# Patient Record
Sex: Male | Born: 1935
Health system: Southern US, Community
[De-identification: ages and names within clinical notes are randomized; demographics above are authoritative.]

## PROBLEM LIST (undated history)

## (undated) DIAGNOSIS — I739 Peripheral vascular disease, unspecified: Secondary | ICD-10-CM

## (undated) DIAGNOSIS — C801 Malignant (primary) neoplasm, unspecified: Secondary | ICD-10-CM

## (undated) DIAGNOSIS — K219 Gastro-esophageal reflux disease without esophagitis: Secondary | ICD-10-CM

## (undated) DIAGNOSIS — I10 Essential (primary) hypertension: Secondary | ICD-10-CM

## (undated) DIAGNOSIS — E039 Hypothyroidism, unspecified: Secondary | ICD-10-CM

## (undated) HISTORY — PX: TONSILLECTOMY: SUR1361

---

## 1999-08-27 ENCOUNTER — Encounter: Payer: Self-pay | Admitting: Family Medicine

## 1999-08-27 ENCOUNTER — Ambulatory Visit (HOSPITAL_COMMUNITY): Admission: RE | Admit: 1999-08-27 | Discharge: 1999-08-27 | Payer: Self-pay | Admitting: Family Medicine

## 2001-05-02 ENCOUNTER — Emergency Department (HOSPITAL_COMMUNITY): Admission: EM | Admit: 2001-05-02 | Discharge: 2001-05-02 | Payer: Self-pay

## 2001-05-14 ENCOUNTER — Encounter: Admission: RE | Admit: 2001-05-14 | Discharge: 2001-05-14 | Payer: Self-pay | Admitting: Family Medicine

## 2001-05-14 ENCOUNTER — Encounter: Payer: Self-pay | Admitting: Family Medicine

## 2006-10-27 ENCOUNTER — Encounter (HOSPITAL_BASED_OUTPATIENT_CLINIC_OR_DEPARTMENT_OTHER): Admission: RE | Admit: 2006-10-27 | Discharge: 2006-11-23 | Payer: Self-pay | Admitting: Internal Medicine

## 2006-12-05 ENCOUNTER — Encounter (HOSPITAL_BASED_OUTPATIENT_CLINIC_OR_DEPARTMENT_OTHER): Admission: RE | Admit: 2006-12-05 | Discharge: 2007-01-25 | Payer: Self-pay | Admitting: Surgery

## 2011-02-07 ENCOUNTER — Encounter (HOSPITAL_BASED_OUTPATIENT_CLINIC_OR_DEPARTMENT_OTHER): Payer: Medicare Other | Attending: Internal Medicine

## 2011-02-07 DIAGNOSIS — I10 Essential (primary) hypertension: Secondary | ICD-10-CM | POA: Insufficient documentation

## 2011-02-07 DIAGNOSIS — Z79899 Other long term (current) drug therapy: Secondary | ICD-10-CM | POA: Insufficient documentation

## 2011-02-07 DIAGNOSIS — E039 Hypothyroidism, unspecified: Secondary | ICD-10-CM | POA: Insufficient documentation

## 2011-02-07 DIAGNOSIS — L97809 Non-pressure chronic ulcer of other part of unspecified lower leg with unspecified severity: Secondary | ICD-10-CM | POA: Insufficient documentation

## 2011-02-07 DIAGNOSIS — E538 Deficiency of other specified B group vitamins: Secondary | ICD-10-CM | POA: Insufficient documentation

## 2011-02-07 DIAGNOSIS — Z7982 Long term (current) use of aspirin: Secondary | ICD-10-CM | POA: Insufficient documentation

## 2011-02-07 DIAGNOSIS — I872 Venous insufficiency (chronic) (peripheral): Secondary | ICD-10-CM | POA: Insufficient documentation

## 2011-02-07 DIAGNOSIS — L97509 Non-pressure chronic ulcer of other part of unspecified foot with unspecified severity: Secondary | ICD-10-CM | POA: Insufficient documentation

## 2011-02-28 ENCOUNTER — Encounter (HOSPITAL_BASED_OUTPATIENT_CLINIC_OR_DEPARTMENT_OTHER): Payer: Medicare Other | Attending: Internal Medicine

## 2011-02-28 DIAGNOSIS — L97809 Non-pressure chronic ulcer of other part of unspecified lower leg with unspecified severity: Secondary | ICD-10-CM | POA: Insufficient documentation

## 2011-02-28 DIAGNOSIS — I872 Venous insufficiency (chronic) (peripheral): Secondary | ICD-10-CM | POA: Insufficient documentation

## 2011-02-28 DIAGNOSIS — E538 Deficiency of other specified B group vitamins: Secondary | ICD-10-CM | POA: Insufficient documentation

## 2011-02-28 DIAGNOSIS — Z7982 Long term (current) use of aspirin: Secondary | ICD-10-CM | POA: Insufficient documentation

## 2011-02-28 DIAGNOSIS — Z79899 Other long term (current) drug therapy: Secondary | ICD-10-CM | POA: Insufficient documentation

## 2011-02-28 DIAGNOSIS — E039 Hypothyroidism, unspecified: Secondary | ICD-10-CM | POA: Insufficient documentation

## 2011-02-28 DIAGNOSIS — L97509 Non-pressure chronic ulcer of other part of unspecified foot with unspecified severity: Secondary | ICD-10-CM | POA: Insufficient documentation

## 2011-02-28 DIAGNOSIS — I10 Essential (primary) hypertension: Secondary | ICD-10-CM | POA: Insufficient documentation

## 2012-01-22 ENCOUNTER — Emergency Department (HOSPITAL_COMMUNITY): Payer: Medicare Other

## 2012-01-22 ENCOUNTER — Emergency Department (HOSPITAL_COMMUNITY)
Admission: EM | Admit: 2012-01-22 | Discharge: 2012-01-23 | Disposition: A | Discharge status: Home / Self Care | Payer: Medicare Other | Attending: Emergency Medicine | Admitting: Emergency Medicine

## 2012-01-22 ENCOUNTER — Encounter (HOSPITAL_COMMUNITY): Payer: Self-pay | Admitting: *Deleted

## 2012-01-22 ENCOUNTER — Other Ambulatory Visit: Payer: Self-pay

## 2012-01-22 DIAGNOSIS — M25519 Pain in unspecified shoulder: Secondary | ICD-10-CM | POA: Insufficient documentation

## 2012-01-22 DIAGNOSIS — IMO0001 Reserved for inherently not codable concepts without codable children: Secondary | ICD-10-CM | POA: Insufficient documentation

## 2012-01-22 DIAGNOSIS — M549 Dorsalgia, unspecified: Secondary | ICD-10-CM | POA: Insufficient documentation

## 2012-01-22 DIAGNOSIS — M7918 Myalgia, other site: Secondary | ICD-10-CM

## 2012-01-22 DIAGNOSIS — R799 Abnormal finding of blood chemistry, unspecified: Secondary | ICD-10-CM | POA: Insufficient documentation

## 2012-01-22 DIAGNOSIS — J984 Other disorders of lung: Secondary | ICD-10-CM | POA: Insufficient documentation

## 2012-01-22 DIAGNOSIS — M25512 Pain in left shoulder: Secondary | ICD-10-CM

## 2012-01-22 DIAGNOSIS — K449 Diaphragmatic hernia without obstruction or gangrene: Secondary | ICD-10-CM | POA: Insufficient documentation

## 2012-01-22 LAB — COMPREHENSIVE METABOLIC PANEL
ALT: 14 U/L (ref 0–53)
AST: 25 U/L (ref 0–37)
Albumin: 4.3 g/dL (ref 3.5–5.2)
Alkaline Phosphatase: 61 U/L (ref 39–117)
BUN: 20 mg/dL (ref 6–23)
CO2: 27 mEq/L (ref 19–32)
Calcium: 10 mg/dL (ref 8.4–10.5)
Chloride: 100 mEq/L (ref 96–112)
Creatinine, Ser: 1.42 mg/dL — ABNORMAL HIGH (ref 0.50–1.35)
GFR calc Af Amer: 49 mL/min — ABNORMAL LOW (ref >90)
GFR calc non Af Amer: 42 mL/min — ABNORMAL LOW (ref >90)
Glucose, Bld: 99 mg/dL (ref 70–99)
Potassium: 4.1 mEq/L (ref 3.5–5.1)
Sodium: 138 mEq/L (ref 135–145)
Total Bilirubin: 0.6 mg/dL (ref 0.3–1.2)
Total Protein: 7.3 g/dL (ref 6.0–8.3)

## 2012-01-22 LAB — DIFFERENTIAL
Basophils Absolute: 0 10*3/uL (ref 0.0–0.1)
Basophils Relative: 1 % (ref 0–1)
Eosinophils Absolute: 0.1 10*3/uL (ref 0.0–0.7)
Eosinophils Relative: 2 % (ref 0–5)
Lymphocytes Relative: 37 % (ref 12–46)
Lymphs Abs: 1.6 10*3/uL (ref 0.7–4.0)
Monocytes Absolute: 0.4 10*3/uL (ref 0.1–1.0)
Monocytes Relative: 10 % (ref 3–12)
Neutro Abs: 2.2 10*3/uL (ref 1.7–7.7)
Neutrophils Relative %: 51 % (ref 43–77)

## 2012-01-22 LAB — CBC
HCT: 41.1 % (ref 39.0–52.0)
Hemoglobin: 14.1 g/dL (ref 13.0–17.0)
MCH: 30.7 pg (ref 26.0–34.0)
MCHC: 34.3 g/dL (ref 30.0–36.0)
MCV: 89.5 fL (ref 78.0–100.0)
Platelets: 136 10*3/uL — ABNORMAL LOW (ref 150–400)
RBC: 4.59 MIL/uL (ref 4.22–5.81)
RDW: 13.5 % (ref 11.5–15.5)
WBC: 4.3 10*3/uL (ref 4.0–10.5)

## 2012-01-22 LAB — D-DIMER, QUANTITATIVE (NOT AT ARMC): D-Dimer, Quant: 0.57 ug/mL-FEU — ABNORMAL HIGH (ref 0.00–0.48)

## 2012-01-22 LAB — LIPASE, BLOOD: Lipase: 26 U/L (ref 11–59)

## 2012-01-22 LAB — TROPONIN I: Troponin I: <0.3 ng/mL (ref <0.30)

## 2012-01-22 MED ORDER — OXYCODONE-ACETAMINOPHEN 5-325 MG PO TABS
1.0000 | ORAL_TABLET | Freq: Once | ORAL | Status: AC
  Administered 2012-01-22: 1 via ORAL
  Filled 2012-01-22: qty 1

## 2012-01-22 MED ORDER — IOHEXOL 300 MG/ML  SOLN
80.0000 mL | Freq: Once | INTRAMUSCULAR | Status: AC | PRN
  Administered 2012-01-22: 80 mL via INTRAVENOUS

## 2012-01-22 MED ORDER — SODIUM CHLORIDE 0.9 % IV BOLUS (SEPSIS)
1000.0000 mL | Freq: Once | INTRAVENOUS | Status: AC
  Administered 2012-01-22: 1000 mL via INTRAVENOUS

## 2012-01-22 NOTE — ED Notes (Signed)
MD at bedside. 

## 2012-01-22 NOTE — ED Notes (Signed)
Patient transported to CT 

## 2012-01-22 NOTE — ED Notes (Signed)
Pt states he was lifting wood and starting having back pain. Pt states he took a Advil and slept all night. Pain returned when he began to do work around the home

## 2012-01-22 NOTE — ED Notes (Signed)
Pt in c/o episode of abd pain last night and feeling bloated, today he noted sharp mid to upper back pain, pain can be caused by movement but is constant, denies chest pain, states pain is increased when taking a deep breath, denies abd pain today, states he has been burping

## 2012-01-23 MED ORDER — TRAMADOL HCL 50 MG PO TABS: 50.0000 mg | ORAL_TABLET | Freq: Four times a day (QID) | ORAL | Status: AC | PRN

## 2012-01-23 MED ORDER — OXYCODONE-ACETAMINOPHEN 5-325 MG PO TABS: 1.0000 | ORAL_TABLET | Freq: Four times a day (QID) | ORAL | Status: AC | PRN

## 2012-01-23 NOTE — ED Notes (Signed)
Rx given Pt

## 2012-01-23 NOTE — ED Provider Notes (Signed)
History     CSN: [id number]  Arrival date & time 01/22/12  1952   First MD Initiated Contact with Patient 01/22/12 2132      Chief Complaint  Patient presents with  . Back Pain  . Abdominal Pain    (Consider location/radiation/quality/duration/timing/severity/associated sxs/prior treatment) HPI Patient is a pleasant 78-year-old male who presents today complaining of left shoulder pain that radiates through his chest. This occurred for the first time after the patient was carrying firewood. He reports that the pain is not constant medications and goes. It seems to happen when patient moves in a certain way. He denies any chest pain or shortness of breath at this time. He also denies any abdominal pain. Patient does feel his pain is worse when he takes a deep breath. He is not have any history of DVT or PE. Patient is hypertensive on presentation but his daughter reports that this is always the case and he sees a physician. When she checks his blood pressures at home they're always within normal limits. Patient has not had any recent fever cough. There are no other associated or modifying factors. History reviewed. No pertinent past medical history.  History reviewed. No pertinent past surgical history.  History reviewed. No pertinent family history.  History  Substance Use Topics  . Smoking status: Not on file  . Smokeless tobacco: Not on file  . Alcohol Use: Not on file      Review of Systems  Constitutional: Negative.   HENT: Negative.   Eyes: Negative.   Respiratory: Negative.   Cardiovascular: Negative.   Gastrointestinal: Negative.   Genitourinary: Negative.   Musculoskeletal: Negative.        See history of present illness  Skin: Negative.   Neurological: Negative.   Hematological: Negative.   Psychiatric/Behavioral: Negative.   All other systems reviewed and are negative.    Allergies  Review of patient's allergies indicates no known allergies.  Home  Medications   Current Outpatient Rx  Name Route Sig Dispense Refill  . ASPIRIN EC 81 MG PO TBEC Oral Take 81 mg by mouth daily.    Marland Kitchen CALCIUM CARBONATE ANTACID 500 MG PO CHEW Oral Chew 1 tablet by mouth 3 (three) times daily.    Marland Kitchen VITAMIN B-12 IJ Injection Inject 1 Units as directed every 30 (thirty) days.    . ENALAPRIL MALEATE 20 MG PO TABS Oral Take 20 mg by mouth daily.    Marland Kitchen FAMOTIDINE 20 MG PO TABS Oral Take 20 mg by mouth 2 (two) times daily.    . FUROSEMIDE 20 MG PO TABS Oral Take 20 mg by mouth daily.    Marland Kitchen LEVOTHYROXINE SODIUM PO Oral Take 1 tablet by mouth daily.    Marland Kitchen PRESCRIPTION MEDICATION Oral Take 1 tablet by mouth daily. Blood pressure medication. Last taken at 7:30am 2/24.      BP 183/98  Pulse 79  Temp(Src) 98.2 F (36.8 C) (Oral)  Resp 28  SpO2 99%  Physical Exam  Nursing note and vitals reviewed. GEN: Well-developed, well-nourished elderly male in no distress HEENT: Atraumatic, normocephalic. Oropharynx clear without erythema EYES: PERRLA BL, no scleral icterus. NECK: Trachea midline, no meningismus CV: regular rate and rhythm. No murmurs, rubs, or gallops PULM: No respiratory distress.  No crackles, wheezes, or rales. GI: soft, non-tender. No guarding, rebound, or tenderness. + bowel sounds  Neuro: cranial nerves 2-12 intact, no abnormalities of strength or sensation, A and O x 3 MSK: Patient moves all 4 extremities symmetrically,  no deformity, edema, or injury noted Psych: no abnormality of mood   ED Course  Procedures (including critical care time)   Date: 01/23/2012  Rate: 72  Rhythm: normal sinus rhythm  QRS Axis: normal  Intervals: PR prolonged  ST/T Wave abnormalities: nonspecific T wave changes  Conduction Disutrbances:first-degree A-V block   Narrative Interpretation:   Old EKG Reviewed: none available   Labs Reviewed  CBC - Abnormal; Notable for the following:    Platelets 136 (*)    All other components within normal limits    COMPREHENSIVE METABOLIC PANEL - Abnormal; Notable for the following:    Creatinine, Ser 1.42 (*)    GFR calc non Af Amer 42 (*)    GFR calc Af Amer 49 (*)    All other components within normal limits  D-DIMER, QUANTITATIVE - Abnormal; Notable for the following:    D-Dimer, Quant 0.57 (*)    All other components within normal limits  DIFFERENTIAL  TROPONIN I  LIPASE, BLOOD    CT ANGIO CHEST W/CM &/OR WO CM   Final Result:         1. Shoulder pain, left   2. Musculoskeletal pain       MDM  Patient was evaluated by myself. Based on his presentation he had workup for possible causes of his symptoms.  This included CBC, CMP and lipase, troponin, chest x-ray, EKG, and d-dimer initially. Patient had slightly elevated creatinine but this is not surprising given his age. Patient also did have a slightly elevated d-dimer. Given the variation in his symptoms with deep breathing CT angiogram reasonable. Given patient's renal function I discussed this with radiology. They reported that CT angiogram be performed with reduced contrast. Patient was also given a liter of normal saline IV bolus. CT image of the chest was unremarkable. Patient was treated with Percocet here in the emergency department felt better. He was discharged with both prescriptions for tramadol and Percocet. Family was told that the patient should try and use tramadol. If he still cannot tolerate his symptoms they can use the Percocet sparingly as that has worked here in the emergency department. Patient and family were comfortable with plan and patient was discharged home in good condition.        Chauncy Passy, MD 01/25/12 [phone]

## 2012-02-13 ENCOUNTER — Emergency Department (HOSPITAL_COMMUNITY): Payer: Medicare Other

## 2012-02-13 ENCOUNTER — Other Ambulatory Visit: Payer: Self-pay

## 2012-02-13 ENCOUNTER — Emergency Department (HOSPITAL_COMMUNITY)
Admission: EM | Admit: 2012-02-13 | Discharge: 2012-02-13 | Disposition: A | Discharge status: Home / Self Care | Payer: Medicare Other | Attending: Emergency Medicine | Admitting: Emergency Medicine

## 2012-02-13 ENCOUNTER — Encounter (HOSPITAL_COMMUNITY): Payer: Self-pay

## 2012-02-13 DIAGNOSIS — K219 Gastro-esophageal reflux disease without esophagitis: Secondary | ICD-10-CM | POA: Insufficient documentation

## 2012-02-13 DIAGNOSIS — I10 Essential (primary) hypertension: Secondary | ICD-10-CM | POA: Insufficient documentation

## 2012-02-13 DIAGNOSIS — K449 Diaphragmatic hernia without obstruction or gangrene: Secondary | ICD-10-CM

## 2012-02-13 DIAGNOSIS — I739 Peripheral vascular disease, unspecified: Secondary | ICD-10-CM | POA: Insufficient documentation

## 2012-02-13 DIAGNOSIS — R079 Chest pain, unspecified: Secondary | ICD-10-CM | POA: Insufficient documentation

## 2012-02-13 HISTORY — DX: Essential (primary) hypertension: I10

## 2012-02-13 HISTORY — DX: Gastro-esophageal reflux disease without esophagitis: K21.9

## 2012-02-13 HISTORY — DX: Peripheral vascular disease, unspecified: I73.9

## 2012-02-13 LAB — BASIC METABOLIC PANEL
BUN: 21 mg/dL (ref 6–23)
CO2: 28 mEq/L (ref 19–32)
Calcium: 9.7 mg/dL (ref 8.4–10.5)
Chloride: 103 mEq/L (ref 96–112)
Creatinine, Ser: 1.35 mg/dL (ref 0.50–1.35)
GFR calc Af Amer: 52 mL/min — ABNORMAL LOW (ref >90)
GFR calc non Af Amer: 45 mL/min — ABNORMAL LOW (ref >90)
Glucose, Bld: 105 mg/dL — ABNORMAL HIGH (ref 70–99)
Potassium: 4.7 mEq/L (ref 3.5–5.1)
Sodium: 142 mEq/L (ref 135–145)

## 2012-02-13 LAB — POCT I-STAT TROPONIN I: Troponin i, poc: 0 ng/mL (ref 0.00–0.08)

## 2012-02-13 LAB — URINALYSIS, ROUTINE W REFLEX MICROSCOPIC
Bilirubin Urine: NEGATIVE
Glucose, UA: NEGATIVE mg/dL
Hgb urine dipstick: NEGATIVE
Ketones, ur: NEGATIVE mg/dL
Leukocytes, UA: NEGATIVE
Nitrite: NEGATIVE
Protein, ur: NEGATIVE mg/dL
Specific Gravity, Urine: 1.011 (ref 1.005–1.030)
Urobilinogen, UA: 1 mg/dL (ref 0.0–1.0)
pH: 6.5 (ref 5.0–8.0)

## 2012-02-13 LAB — CBC
HCT: 40.6 % (ref 39.0–52.0)
Hemoglobin: 13.9 g/dL (ref 13.0–17.0)
MCH: 31 pg (ref 26.0–34.0)
MCHC: 34.2 g/dL (ref 30.0–36.0)
MCV: 90.6 fL (ref 78.0–100.0)
Platelets: 129 10*3/uL — ABNORMAL LOW (ref 150–400)
RBC: 4.48 MIL/uL (ref 4.22–5.81)
RDW: 13.6 % (ref 11.5–15.5)
WBC: 5.2 10*3/uL (ref 4.0–10.5)

## 2012-02-13 LAB — POCT I-STAT, CHEM 8
BUN: 23 mg/dL (ref 6–23)
Calcium, Ion: 1.23 mmol/L (ref 1.12–1.32)
Chloride: 106 mEq/L (ref 96–112)
Creatinine, Ser: 1.4 mg/dL — ABNORMAL HIGH (ref 0.50–1.35)
Glucose, Bld: 107 mg/dL — ABNORMAL HIGH (ref 70–99)
HCT: 42 % (ref 39.0–52.0)
Hemoglobin: 14.3 g/dL (ref 13.0–17.0)
Potassium: 5.1 mEq/L (ref 3.5–5.1)
Sodium: 143 mEq/L (ref 135–145)
TCO2: 27 mmol/L (ref 0–100)

## 2012-02-13 LAB — DIFFERENTIAL
Basophils Absolute: 0 10*3/uL (ref 0.0–0.1)
Basophils Relative: 0 % (ref 0–1)
Eosinophils Absolute: 0 10*3/uL (ref 0.0–0.7)
Eosinophils Relative: 1 % (ref 0–5)
Lymphocytes Relative: 21 % (ref 12–46)
Lymphs Abs: 1.1 10*3/uL (ref 0.7–4.0)
Monocytes Absolute: 0.3 10*3/uL (ref 0.1–1.0)
Monocytes Relative: 6 % (ref 3–12)
Neutro Abs: 3.8 10*3/uL (ref 1.7–7.7)
Neutrophils Relative %: 72 % (ref 43–77)

## 2012-02-13 NOTE — ED Provider Notes (Signed)
History     CSN: [id number]  Arrival date & time 02/13/12  1259   First MD Initiated Contact with Patient 02/13/12 1325      Chief Complaint  Patient presents with  . Chest Pain    (Consider location/radiation/quality/duration/timing/severity/associated sxs/prior treatment) HPI Comments: [name] is a 78 y.o. Male who has had constant chest pain for 3 days after chopping tingling. The pain resolved while he was at his doctor's office this morning and has not returned. He received aspirin, and nitroglycerin by his physician. He was recently evaluated in the emergency with CT angiogram chest and blood work. He was started on omeprazole. He reports that he initially improved with this treatment. He denies weakness, dizziness, nausea, vomiting, shortness of breath, headache, paresthesias. He came here by EMS. His son is with him.  Patient is a 78 y.o. male presenting with chest pain. The history is provided by the patient.  Chest Pain     Past Medical History  Diagnosis Date  . Hypertension   . Hypertension     requires diuretic  . Acid reflux   . Malaria     while in the service  . Peripheral vascular disease     slow wound healing on legs    Past Surgical History  Procedure Date  . Tonsillectomy     History reviewed. No pertinent family history.  History  Substance Use Topics  . Smoking status: Never Smoker   . Smokeless tobacco: Not on file  . Alcohol Use: No      Review of Systems  Cardiovascular: Positive for chest pain.  All other systems reviewed and are negative.    Allergies  Codeine; Hydrocodone; and Percocet  Home Medications   Current Outpatient Rx  Name Route Sig Dispense Refill  . ASPIRIN EC 81 MG PO TBEC Oral Take 81 mg by mouth daily.    Marland Kitchen CALCIUM CARBONATE ANTACID 500 MG PO CHEW Oral Chew 1 tablet by mouth 3 (three) times daily.    Marland Kitchen VITAMIN B-12 IJ Injection Inject 1 Units as directed every 30 (thirty) days. 1st of month    .  ENALAPRIL MALEATE 20 MG PO TABS Oral Take 20 mg by mouth daily.    . FUROSEMIDE 20 MG PO TABS Oral Take 20 mg by mouth daily.    Marland Kitchen LEVOTHYROXINE SODIUM PO Oral Take 1 tablet by mouth daily.    Marland Kitchen OMEPRAZOLE 20 MG PO CPDR Oral Take 20 mg by mouth daily.      BP 176/96  Pulse 80  Temp(Src) 97.9 F (36.6 C) (Oral)  Resp 18  SpO2 94%  Physical Exam  Nursing note and vitals reviewed. Constitutional: He is oriented to person, place, and time. He appears well-developed and well-nourished.  HENT:  Head: Normocephalic and atraumatic.  Right Ear: External ear normal.  Left Ear: External ear normal.  Eyes: Conjunctivae and EOM are normal. Pupils are equal, round, and reactive to light.  Neck: Normal range of motion and phonation normal. Neck supple.  Cardiovascular: Normal rate, regular rhythm, normal heart sounds and intact distal pulses.   Pulmonary/Chest: Effort normal and breath sounds normal. He exhibits no bony tenderness.  Abdominal: Soft. Normal appearance. There is no tenderness.  Musculoskeletal: Normal range of motion.  Neurological: He is alert and oriented to person, place, and time. He has normal strength. No cranial nerve deficit or sensory deficit. He exhibits normal muscle tone. Coordination normal.  Skin: Skin is warm, dry and intact.  Psychiatric: He has a normal mood and affect. His behavior is normal. Judgment and thought content normal.    ED Course  Procedures (including critical care time)   Date: 02/13/2012  Rate: 71  Rhythm: normal sinus rhythm  QRS Axis: normal  Intervals: PR prolonged  ST/T Wave abnormalities: normal  Conduction Disutrbances:none  Narrative Interpretation:   Old EKG Reviewed: unchanged     Labs Reviewed  CBC - Abnormal; Notable for the following:    Platelets 129 (*)    All other components within normal limits  BASIC METABOLIC PANEL - Abnormal; Notable for the following:    Glucose, Bld 105 (*)    GFR calc non Af Amer 45 (*)     GFR calc Af Amer 52 (*)    All other components within normal limits  POCT I-STAT, CHEM 8 - Abnormal; Notable for the following:    Creatinine, Ser 1.40 (*)    Glucose, Bld 107 (*)    All other components within normal limits  DIFFERENTIAL  URINALYSIS, ROUTINE W REFLEX MICROSCOPIC  POCT I-STAT TROPONIN I   Dg Chest 2 View  02/13/2012  *RADIOLOGY REPORT*  Clinical Data: Chest pain and weakness for the past week.  CHEST - 2 VIEW  Comparison: CTA of 01/22/2012.  Findings: Hyperinflation, as evidenced by increased A - P diameter of the chest.  Moderate osteopenia with accentuation of expected thoracic kyphosis.  Midline trachea.  Mild cardiomegaly.  Moderate hiatal hernia. No pleural effusion or pneumothorax.  No congestive failure.  Clear lungs.  IMPRESSION:  1. Cardiomegaly without congestive failure. 2.  Moderate hiatal hernia. 3.  Please note that the patient had a negative CTA for chest pain on 01/23/2012.  Original Report Authenticated By: Areta Haber, M.D.   2:49 PM-I discussed the case with his primary care doctor, who will arrange GI followup   1. Chest pain   2. Hiatal hernia       MDM  Transient chest pain, atypical for cardiac disease, completely resolved, prior to evaluation in the emergency department. ED  evaluation negative for evidence of ACS, PE, or pneumonia. He recently had a CT angiogram. I doubt aortic dissection as the source of his discomfort. I suspect gastrointestinal disease with esophageal reflux, and complication due to his hiatal hernia.    Plan: Home Medications- Continue usual meds.; Home Treatments- Use Maalox AC and HS; Recommended follow up- f/u PCP and GI        Richarda Blade, MD 02/13/12 1453

## 2012-02-13 NOTE — Discharge Instructions (Signed)
Take Maalox before meals and at bedtime. Consider using a protein shake like Ensure, before bedtime, so you don't have to eat regular food at that time. Return here if needed for problems. Your doctor will help arrange followup with a gastrointestinal physician.  Chest Pain (Nonspecific) It is often hard to give a specific diagnosis for the cause of chest pain. There is always a chance that your pain could be related to something serious, such as a heart attack or a blood clot in the lungs. You need to follow up with your caregiver for further evaluation. CAUSES   Heartburn.   Pneumonia or bronchitis.   Anxiety or stress.   Inflammation around your heart (pericarditis) or lung (pleuritis or pleurisy).   A blood clot in the lung.   A collapsed lung (pneumothorax). It can develop suddenly on its own (spontaneous pneumothorax) or from injury (trauma) to the chest.   Shingles infection (herpes zoster virus).  The chest wall is composed of bones, muscles, and cartilage. Any of these can be the source of the pain.  The bones can be bruised by injury.   The muscles or cartilage can be strained by coughing or overwork.   The cartilage can be affected by inflammation and become sore (costochondritis).  DIAGNOSIS  Lab tests or other studies, such as X-rays, electrocardiography, stress testing, or cardiac imaging, may be needed to find the cause of your pain.  TREATMENT   Treatment depends on what may be causing your chest pain. Treatment may include:   Acid blockers for heartburn.   Anti-inflammatory medicine.   Pain medicine for inflammatory conditions.   Antibiotics if an infection is present.   You may be advised to change lifestyle habits. This includes stopping smoking and avoiding alcohol, caffeine, and chocolate.   You may be advised to keep your head raised (elevated) when sleeping. This reduces the chance of acid going backward from your stomach into your esophagus.   Most  of the time, nonspecific chest pain will improve within 2 to 3 days with rest and mild pain medicine.  HOME CARE INSTRUCTIONS   If antibiotics were prescribed, take your antibiotics as directed. Finish them even if you start to feel better.   For the next few days, avoid physical activities that bring on chest pain. Continue physical activities as directed.   Do not smoke.   Avoid drinking alcohol.   Only take over-the-counter or prescription medicine for pain, discomfort, or fever as directed by your caregiver.   Follow your caregiver's suggestions for further testing if your chest pain does not go away.   Keep any follow-up appointments you made. If you do not go to an appointment, you could develop lasting (chronic) problems with pain. If there is any problem keeping an appointment, you must call to reschedule.  SEEK MEDICAL CARE IF:   You think you are having problems from the medicine you are taking. Read your medicine instructions carefully.   Your chest pain does not go away, even after treatment.   You develop a rash with blisters on your chest.  SEEK IMMEDIATE MEDICAL CARE IF:   You have increased chest pain or pain that spreads to your arm, neck, jaw, back, or abdomen.   You develop shortness of breath, an increasing cough, or you are coughing up blood.   You have severe back or abdominal pain, feel nauseous, or vomit.   You develop severe weakness, fainting, or chills.   You have a fever.  THIS IS AN EMERGENCY. Do not wait to see if the pain will go away. Get medical help at once. Call your local emergency services (911 in [place] Do not drive yourself to the hospital. MAKE SURE YOU:   Understand these instructions.   Will watch your condition.   Will get help right away if you are not doing well or get worse.  Document Released: 08/24/2005 Document Revised: 11/03/2011 Document Reviewed: 06/19/2008 [facility] Patient Information 2012 [place], [place].

## 2012-02-13 NOTE — ED Notes (Signed)
Pt states that over 1 week ago he had developed substernal chest pain and upper back pain. He went to [place] and was evaluated and was released. Possible gerd was the diagnosis and he was started on prilosec. Pt states that this had relieved his chest pain and Saturday he decided to go outside and chop wood. The pain then returned. Pt states that he was unable to sleep due to the pain and was also unable to recline. He described the pain as substernal and still in the upper back region. 324mg  asa and 1 sl nitro with no relief. Pt was picked up by ems from [facility]. He is alert and oriented. Protocols initiated upon arrival to dept. Pain remains upon arrival to dept.

## 2013-02-18 ENCOUNTER — Telehealth: Payer: Self-pay | Admitting: Family Medicine

## 2013-02-18 MED ORDER — CYANOCOBALAMIN 1000 MCG/ML IJ SOLN: 1000.0000 ug | INTRAMUSCULAR | Status: DC

## 2013-02-18 NOTE — Telephone Encounter (Signed)
rx refilled.

## 2013-02-25 ENCOUNTER — Ambulatory Visit (INDEPENDENT_AMBULATORY_CARE_PROVIDER_SITE_OTHER): Payer: Medicare Other | Admitting: Family Medicine

## 2013-02-25 ENCOUNTER — Encounter: Payer: Self-pay | Admitting: Family Medicine

## 2013-02-25 VITALS — BP 160/80 | HR 80 | Temp 97.6°F | Resp 14 | Wt 208.0 lb

## 2013-02-25 DIAGNOSIS — M25561 Pain in right knee: Secondary | ICD-10-CM

## 2013-02-25 DIAGNOSIS — I10 Essential (primary) hypertension: Secondary | ICD-10-CM | POA: Insufficient documentation

## 2013-02-25 DIAGNOSIS — F411 Generalized anxiety disorder: Secondary | ICD-10-CM

## 2013-02-25 DIAGNOSIS — M25569 Pain in unspecified knee: Secondary | ICD-10-CM

## 2013-02-25 DIAGNOSIS — K219 Gastro-esophageal reflux disease without esophagitis: Secondary | ICD-10-CM | POA: Insufficient documentation

## 2013-02-25 DIAGNOSIS — I739 Peripheral vascular disease, unspecified: Secondary | ICD-10-CM | POA: Insufficient documentation

## 2013-02-25 MED ORDER — PREDNISONE 10 MG PO TABS: ORAL_TABLET | ORAL | Status: DC

## 2013-02-25 MED ORDER — ALPRAZOLAM 0.25 MG PO TABS: 0.2500 mg | ORAL_TABLET | Freq: Two times a day (BID) | ORAL | Status: DC | PRN

## 2013-02-25 NOTE — Progress Notes (Signed)
Subjective:     Patient ID: [name], male   DOB: 10/15/1919, 79 y.o.   MRN: [id number]  HPI 2 weeks ago the patient fell in the kitchen  he sustained a twisting injury to his right knee.  He has had a moderate effusion in the right knee ever since.  He denies any pain in the knee. Does report decreased range of motion in her right knee due to the swelling. There is no pain in his calf there is no pain in his hamstring and there is no pain in his quadriceps.  He denies any clicking or grinding in the knee. He denies any laxity in the knee, and he denies any locking in the knee.   Past Medical History  Diagnosis Date  . Malaria     while in the service  . Peripheral vascular disease     slow wound healing on legs  . Acid reflux   . Hypertension   . Hypertension     requires diuretic   Current Outpatient Prescriptions on File Prior to Visit  Medication Sig Dispense Refill  . aspirin EC 81 MG tablet Take 81 mg by mouth daily.      . calcium carbonate (TUMS - DOSED IN MG ELEMENTAL CALCIUM) 500 MG chewable tablet Chew 1 tablet by mouth 3 (three) times daily.      . cyanocobalamin (,VITAMIN B-12,) 1000 MCG/ML injection Inject 1 mL (1,000 mcg total) into the muscle every 30 (thirty) days.  30 mL  1  . enalapril (VASOTEC) 20 MG tablet Take 20 mg by mouth daily.      . furosemide (LASIX) 20 MG tablet Take 20 mg by mouth daily.      Marland Kitchen omeprazole (PRILOSEC) 20 MG capsule Take 20 mg by mouth daily.      . [DISCONTINUED] famotidine (PEPCID) 20 MG tablet Take 20 mg by mouth 2 (two) times daily.       No current facility-administered medications on file prior to visit.     Review of Systems Review of systems is otherwise negative    Objective:   Physical Exam  Constitutional: He appears well-developed and well-nourished.  Cardiovascular: Normal rate, regular rhythm and normal heart sounds.   Pulmonary/Chest: Effort normal and breath sounds normal. No respiratory distress. He has no wheezes.   Musculoskeletal:       Right knee: He exhibits decreased range of motion, swelling and effusion. He exhibits no ecchymosis, no deformity, no erythema, no LCL laxity, normal patellar mobility, no bony tenderness, normal meniscus and no MCL laxity. No medial joint line, no lateral joint line, no MCL and no LCL tenderness noted.       Assessment:     Right knee pain, traumatic effusion, suspect meniscal tear.    Plan:     Obtain diagnostic x-ray of the right knee to rule out small fracture.  Prescribed a prednisone taper of 10 mg tablets.  Take 6 tablets on day one, 5 tablets on day 2, 4 tablets on day 3, 3 tablets on day 4, 2 tabs on day 5, 1 tab on day 6.  Recommended RICE therapy.       His wife also suffers from dementia. This causes him anxiety. Struggles sleeping. He would like a very low-dose sedative to help him sleep at times.  Xanax 0.25 mg by mouth each bedtime when necessary.  Gave him 20 tablets and discussed fall cautions and delirium precautions with the patient and the son.

## 2013-02-28 ENCOUNTER — Ambulatory Visit
Admission: RE | Admit: 2013-02-28 | Discharge: 2013-02-28 | Disposition: A | Discharge status: Home / Self Care | Payer: Medicare Other | Source: Ambulatory Visit | Attending: Family Medicine | Admitting: Family Medicine

## 2013-02-28 ENCOUNTER — Telehealth: Payer: Self-pay | Admitting: Family Medicine

## 2013-02-28 ENCOUNTER — Other Ambulatory Visit: Payer: Self-pay | Admitting: Family Medicine

## 2013-02-28 DIAGNOSIS — M25561 Pain in right knee: Secondary | ICD-10-CM

## 2013-02-28 NOTE — Telephone Encounter (Signed)
Called and spoke to wife.  Told about fracture on leg.  Called and made appt for pt to see Dr Tonita Cong at [place] today.  Be there at 3PM to register in.

## 2013-02-28 NOTE — Telephone Encounter (Signed)
Message copied by Olena Mater on Thu Feb 28, 2013 12:01 PM ------      Message from: Jenna Luo T      Created: Thu Feb 28, 2013  9:08 AM       He has a slight fracture on the top of his shin bone.  We need him to see ortho ASAP ------

## 2013-03-06 ENCOUNTER — Telehealth: Payer: Self-pay | Admitting: Family Medicine

## 2013-03-06 NOTE — Telephone Encounter (Signed)
Cancelled Orthopeadic appt for Thurs 03/07/13 at [facility]. Pt had already went to his orthopeadic after his visit here with [facility]

## 2013-04-18 ENCOUNTER — Other Ambulatory Visit: Payer: Self-pay | Admitting: Family Medicine

## 2013-04-26 ENCOUNTER — Telehealth: Payer: Self-pay | Admitting: Family Medicine

## 2013-04-26 MED ORDER — FUROSEMIDE 20 MG PO TABS: ORAL_TABLET | ORAL | Status: DC

## 2013-04-26 NOTE — Telephone Encounter (Signed)
..  Rx filled for 30 days until mail order comes.

## 2013-04-26 NOTE — Telephone Encounter (Signed)
?  ok to refill °

## 2013-04-26 NOTE — Telephone Encounter (Signed)
ok 

## 2013-05-13 ENCOUNTER — Ambulatory Visit (INDEPENDENT_AMBULATORY_CARE_PROVIDER_SITE_OTHER): Payer: Medicare Other | Admitting: Family Medicine

## 2013-05-13 ENCOUNTER — Encounter: Payer: Self-pay | Admitting: Family Medicine

## 2013-05-13 VITALS — BP 160/100 | HR 64 | Temp 97.6°F | Resp 18 | Wt 207.0 lb

## 2013-05-13 DIAGNOSIS — L259 Unspecified contact dermatitis, unspecified cause: Secondary | ICD-10-CM

## 2013-05-13 MED ORDER — MOMETASONE FUROATE 0.1 % EX OINT: TOPICAL_OINTMENT | Freq: Two times a day (BID) | CUTANEOUS | Status: AC

## 2013-05-13 NOTE — Progress Notes (Signed)
  Subjective:    Patient ID: [name], male    DOB: Oct 03, 1919, 79 y.o.   MRN: [id number]  HPI Patient is under a tremendous amount of stress in caring for his wife who has end-stage Alzheimer's disease.  3 days ago he broke out in a pruritic rash in his right antecubital fossa.  He was recently trimming bushes and he also had significant sun exposure in that area. These are the only known contacts which may trigger an allergic reaction but the patient has had. He denies any fevers or chills. He is very anxious today. His blood pressure is elevated. He is tearful. His extremely worried about his wife. Past Medical History  Diagnosis Date  . Malaria     while in the service  . Peripheral vascular disease     slow wound healing on legs  . Acid reflux   . Hypertension   . Hypertension     requires diuretic   Current Outpatient Prescriptions on File Prior to Visit  Medication Sig Dispense Refill  . ALPRAZolam (XANAX) 0.25 MG tablet Take 1 tablet (0.25 mg total) by mouth 2 (two) times daily as needed for sleep.  20 tablet  0  . aspirin EC 81 MG tablet Take 81 mg by mouth daily.      . calcium carbonate (TUMS - DOSED IN MG ELEMENTAL CALCIUM) 500 MG chewable tablet Chew 1 tablet by mouth 3 (three) times daily.      . cyanocobalamin (,VITAMIN B-12,) 1000 MCG/ML injection Inject 1 mL (1,000 mcg total) into the muscle every 30 (thirty) days.  30 mL  1  . enalapril (VASOTEC) 20 MG tablet Take 1 tablet by mouth  every day  90 tablet  1  . furosemide (LASIX) 20 MG tablet Take 1 tablet by mouth  every day  30 tablet  0  . levothyroxine (SYNTHROID, LEVOTHROID) 88 MCG tablet Take 1 tablet by mouth  every day  90 tablet  1  . omeprazole (PRILOSEC) 20 MG capsule Take 20 mg by mouth daily.      . [DISCONTINUED] famotidine (PEPCID) 20 MG tablet Take 20 mg by mouth 2 (two) times daily.       No current facility-administered medications on file prior to visit.   Allergies  Allergen Reactions  . Codeine  Nausea And Vomiting  . Hydrocodone Nausea And Vomiting  . Percocet (Oxycodone-Acetaminophen) Nausea And Vomiting     Review of Systems  All other systems reviewed and are negative.       Objective:   Physical Exam  Vitals reviewed. Cardiovascular: Normal rate, regular rhythm and normal heart sounds.   Pulmonary/Chest: Effort normal and breath sounds normal. No respiratory distress. He has no wheezes. He has no rales.  Skin: Rash noted.  eczematiform rash in the right antecubital fossa        Assessment & Plan:  1. Contact dermatitis Could also be related to the patient's anxiety. We will treat as a contact dermatitis with Elocon 0.1% ointment twice a day for one-week. His blood pressure is very elevated today however the patient is distraught. I recommended to the patient's son that they check his blood pressure when he is calmer and notify me if it's still elevated. The patient's son stated that they would. - mometasone (ELOCON) 0.1 % ointment; Apply topically 2 (two) times daily.  Dispense: 45 g; Refill: 0

## 2013-08-05 ENCOUNTER — Ambulatory Visit (INDEPENDENT_AMBULATORY_CARE_PROVIDER_SITE_OTHER): Payer: Medicare Other | Admitting: Family Medicine

## 2013-08-05 ENCOUNTER — Encounter: Payer: Self-pay | Admitting: Family Medicine

## 2013-08-05 VITALS — BP 146/88 | HR 74 | Temp 97.6°F | Resp 18 | Wt 208.0 lb

## 2013-08-05 DIAGNOSIS — R590 Localized enlarged lymph nodes: Secondary | ICD-10-CM

## 2013-08-05 DIAGNOSIS — R599 Enlarged lymph nodes, unspecified: Secondary | ICD-10-CM

## 2013-08-05 NOTE — Progress Notes (Signed)
Subjective:    Patient ID: [name], male    DOB: Jul 25, 1919, 79 y.o.   MRN: [id number]  HPI Patient reports a two-week history of an enlarging mass in the left side of his neck below his left ear lobe. It is approximately 2.5 cm x 2.5 cm in size.  It is firm and hard to the touch. It is freely mobile. There is no overlying skin erythema. There is no overlying skin warmth. The mass is nonfluctuant. Past Medical History  Diagnosis Date  . Malaria     while in the service  . Peripheral vascular disease     slow wound healing on legs  . Acid reflux   . Hypertension   . Hypertension     requires diuretic   Past Surgical History  Procedure Laterality Date  . Tonsillectomy     Current Outpatient Prescriptions on File Prior to Visit  Medication Sig Dispense Refill  . ALPRAZolam (XANAX) 0.25 MG tablet Take 1 tablet (0.25 mg total) by mouth 2 (two) times daily as needed for sleep.  20 tablet  0  . aspirin EC 81 MG tablet Take 81 mg by mouth daily.      . calcium carbonate (TUMS - DOSED IN MG ELEMENTAL CALCIUM) 500 MG chewable tablet Chew 1 tablet by mouth 3 (three) times daily.      . cyanocobalamin (,VITAMIN B-12,) 1000 MCG/ML injection Inject 1 mL (1,000 mcg total) into the muscle every 30 (thirty) days.  30 mL  1  . enalapril (VASOTEC) 20 MG tablet Take 1 tablet by mouth  every day  90 tablet  1  . furosemide (LASIX) 20 MG tablet Take 1 tablet by mouth  every day  30 tablet  0  . levothyroxine (SYNTHROID, LEVOTHROID) 88 MCG tablet Take 1 tablet by mouth  every day  90 tablet  1  . omeprazole (PRILOSEC) 20 MG capsule Take 20 mg by mouth daily.      . [DISCONTINUED] famotidine (PEPCID) 20 MG tablet Take 20 mg by mouth 2 (two) times daily.       No current facility-administered medications on file prior to visit.   Allergies  Allergen Reactions  . Codeine Nausea And Vomiting  . Hydrocodone Nausea And Vomiting  . Percocet [Oxycodone-Acetaminophen] Nausea And Vomiting   History    Social History  . Marital Status: Married    Spouse Name: N/A    Number of Children: N/A  . Years of Education: N/A   Occupational History  . Not on file.   Social History Main Topics  . Smoking status: Never Smoker   . Smokeless tobacco: Not on file  . Alcohol Use: No  . Drug Use: No  . Sexual Activity:    Other Topics Concern  . Not on file   Social History Narrative  . No narrative on file      Review of Systems  All other systems reviewed and are negative.       Objective:   Physical Exam  Vitals reviewed. Neck: Neck supple.  Cardiovascular: Normal rate and regular rhythm.   Pulmonary/Chest: Effort normal and breath sounds normal.  Lymphadenopathy:    He has cervical adenopathy.   2.5 cm x 2.5 cm fixed hard mass in the left anterior cervical chain below the level of the earlobe.        Assessment & Plan:  1. Anterior cervical lymphadenopathy Plan ultrasound of the neck to confirm pathologic lymph node. If this is  a pathologic lymph node I will schedule a fine needle aspiration biopsy to determine if it is malignancy. The patient will then use that information to determine his treatment options. - US Soft Tissue Head/Neck; Future

## 2013-08-06 ENCOUNTER — Ambulatory Visit
Admission: RE | Admit: 2013-08-06 | Discharge: 2013-08-06 | Disposition: A | Discharge status: Home / Self Care | Payer: Medicare Other | Source: Ambulatory Visit | Attending: Family Medicine | Admitting: Family Medicine

## 2013-08-06 DIAGNOSIS — R590 Localized enlarged lymph nodes: Secondary | ICD-10-CM

## 2013-08-07 ENCOUNTER — Telehealth: Payer: Self-pay | Admitting: Emergency Medicine

## 2013-08-07 ENCOUNTER — Other Ambulatory Visit: Payer: Self-pay | Admitting: Family Medicine

## 2013-08-07 DIAGNOSIS — R221 Localized swelling, mass and lump, neck: Secondary | ICD-10-CM

## 2013-08-07 NOTE — Telephone Encounter (Signed)
CALLED SANDY W/ DR PICKARD TO MAKE THEM AWARE THAT DR WATTS REC. A CT NECK W/ CM PRIOR TO BX.

## 2013-08-07 NOTE — Telephone Encounter (Signed)
Message copied by Janith Lima on Wed Aug 07, 2013 11:09 AM ------      Message from: Sandi Mariscal      Created: Wed Aug 07, 2013 10:48 AM      Regarding: RE: THYROID BX       This pt's needs a neuro neck CT with contrast first.            Also, we need to clarify if they want the LN or just the nodule.            Following the CT (assuming they still want the Bx), I would prefer to do both at the same time in the hospital [facility] or [facility])            Tried to call office to speak with Dr. Dennard Schaumann but couldn't get through.            Thanks,      Ulice Dash      ----- Message -----         From: Janith Lima, EMT         Sent: 08/07/2013   9:38 AM           To: Ir Procedure Requests      Subject: THYROID BX                                               Interventional Radiology Procedure Request                  [name]      [birth year]                  Procedure: THYROID FNA            Reason for procedure: RT THYROID NODULE- 1.6CM            Relevant History: ABN [place] IN EPIC            Ordering Physician: Hurley: [phone]       ------

## 2013-08-08 ENCOUNTER — Other Ambulatory Visit: Payer: Self-pay | Admitting: Radiology

## 2013-08-09 ENCOUNTER — Encounter (HOSPITAL_COMMUNITY): Payer: Self-pay | Admitting: Pharmacy Technician

## 2013-08-12 ENCOUNTER — Ambulatory Visit (HOSPITAL_COMMUNITY)
Admission: RE | Admit: 2013-08-12 | Discharge: 2013-08-12 | Disposition: A | Discharge status: Home / Self Care | Payer: Medicare Other | Source: Ambulatory Visit | Attending: Family Medicine | Admitting: Family Medicine

## 2013-08-12 DIAGNOSIS — R599 Enlarged lymph nodes, unspecified: Secondary | ICD-10-CM | POA: Insufficient documentation

## 2013-08-12 DIAGNOSIS — Z01812 Encounter for preprocedural laboratory examination: Secondary | ICD-10-CM | POA: Insufficient documentation

## 2013-08-12 DIAGNOSIS — R221 Localized swelling, mass and lump, neck: Secondary | ICD-10-CM

## 2013-08-12 LAB — CBC
HCT: 44.5 % (ref 39.0–52.0)
Hemoglobin: 15.2 g/dL (ref 13.0–17.0)
MCH: 31.1 pg (ref 26.0–34.0)
MCHC: 34.2 g/dL (ref 30.0–36.0)
MCV: 91 fL (ref 78.0–100.0)
Platelets: UNDETERMINED 10*3/uL (ref 150–400)
RBC: 4.89 MIL/uL (ref 4.22–5.81)
RDW: 13.5 % (ref 11.5–15.5)
WBC: 4.2 10*3/uL (ref 4.0–10.5)

## 2013-08-12 LAB — APTT: aPTT: 24 seconds (ref 24–37)

## 2013-08-12 LAB — PROTIME-INR
INR: 1 (ref 0.00–1.49)
Prothrombin Time: 13 seconds (ref 11.6–15.2)

## 2013-08-12 MED ORDER — SODIUM CHLORIDE 0.9 % IV SOLN: Freq: Once | INTRAVENOUS | Status: DC

## 2013-08-14 ENCOUNTER — Ambulatory Visit
Admission: RE | Admit: 2013-08-14 | Discharge: 2013-08-14 | Disposition: A | Discharge status: Home / Self Care | Payer: Medicare Other | Source: Ambulatory Visit | Attending: Family Medicine | Admitting: Family Medicine

## 2013-08-14 DIAGNOSIS — R221 Localized swelling, mass and lump, neck: Secondary | ICD-10-CM

## 2013-08-14 MED ORDER — IOHEXOL 300 MG/ML  SOLN
75.0000 mL | Freq: Once | INTRAMUSCULAR | Status: AC | PRN
  Administered 2013-08-14: 75 mL via INTRAVENOUS

## 2013-08-16 ENCOUNTER — Other Ambulatory Visit: Payer: Self-pay | Admitting: Radiology

## 2013-08-16 ENCOUNTER — Encounter (HOSPITAL_COMMUNITY): Payer: Self-pay | Admitting: Pharmacy Technician

## 2013-08-19 ENCOUNTER — Encounter (HOSPITAL_COMMUNITY): Payer: Self-pay

## 2013-08-19 ENCOUNTER — Ambulatory Visit (HOSPITAL_COMMUNITY)
Admission: RE | Admit: 2013-08-19 | Discharge: 2013-08-19 | Disposition: A | Discharge status: Home / Self Care | Payer: Medicare Other | Source: Ambulatory Visit | Attending: Family Medicine | Admitting: Family Medicine

## 2013-08-19 VITALS — BP 169/92 | HR 79 | Temp 97.4°F | Resp 17 | Ht 73.0 in | Wt 208.0 lb

## 2013-08-19 DIAGNOSIS — Z8613 Personal history of malaria: Secondary | ICD-10-CM | POA: Insufficient documentation

## 2013-08-19 DIAGNOSIS — R221 Localized swelling, mass and lump, neck: Secondary | ICD-10-CM

## 2013-08-19 DIAGNOSIS — I739 Peripheral vascular disease, unspecified: Secondary | ICD-10-CM | POA: Insufficient documentation

## 2013-08-19 DIAGNOSIS — Z79899 Other long term (current) drug therapy: Secondary | ICD-10-CM | POA: Insufficient documentation

## 2013-08-19 DIAGNOSIS — K219 Gastro-esophageal reflux disease without esophagitis: Secondary | ICD-10-CM | POA: Insufficient documentation

## 2013-08-19 DIAGNOSIS — I10 Essential (primary) hypertension: Secondary | ICD-10-CM | POA: Insufficient documentation

## 2013-08-19 DIAGNOSIS — C8581 Other specified types of non-Hodgkin lymphoma, lymph nodes of head, face, and neck: Secondary | ICD-10-CM | POA: Insufficient documentation

## 2013-08-19 DIAGNOSIS — E041 Nontoxic single thyroid nodule: Secondary | ICD-10-CM | POA: Insufficient documentation

## 2013-08-19 LAB — CBC
HCT: 43.7 % (ref 39.0–52.0)
Hemoglobin: 15.5 g/dL (ref 13.0–17.0)
MCH: 32 pg (ref 26.0–34.0)
MCHC: 35.5 g/dL (ref 30.0–36.0)
MCV: 90.3 fL (ref 78.0–100.0)
Platelets: 148 10*3/uL — ABNORMAL LOW (ref 150–400)
RBC: 4.84 MIL/uL (ref 4.22–5.81)
RDW: 13.5 % (ref 11.5–15.5)
WBC: 4.7 10*3/uL (ref 4.0–10.5)

## 2013-08-19 MED ORDER — FENTANYL CITRATE 0.05 MG/ML IJ SOLN
INTRAMUSCULAR | Status: AC
  Filled 2013-08-19: qty 4

## 2013-08-19 MED ORDER — SODIUM CHLORIDE 0.9 % IV SOLN: INTRAVENOUS | Status: DC

## 2013-08-19 MED ORDER — MIDAZOLAM HCL 2 MG/2ML IJ SOLN
INTRAMUSCULAR | Status: AC
  Filled 2013-08-19: qty 4

## 2013-08-19 MED ORDER — MIDAZOLAM HCL 2 MG/2ML IJ SOLN
INTRAMUSCULAR | Status: AC | PRN
  Administered 2013-08-19: 0.5 mg via INTRAVENOUS

## 2013-08-19 NOTE — ED Notes (Signed)
MD instructed to just give .5mg  Versed for sedation

## 2013-08-19 NOTE — Procedures (Signed)
Interventional Radiology Procedure Note  Procedure: US guided core biopsy of left cervical lymph node Complications: None Recommendations: - Bedrest 30 minutes - Band-aid on 24 hrs - Follow path results  Signed,  Criselda Peaches, MD Vascular & Interventional Radiologist [facility] Radiology

## 2013-08-19 NOTE — H&P (Signed)
Chief Complaint: "I am here for a biopsy of my neck mass." Referring Physician: Dr. Dennard Schaumann HPI: [name] is an 79 y.o. male who was seen by his PCP Dr. Dennard Schaumann on 08/05/13 c/o 2 week history of enlarging left sided neck mass. The patient underwent US 08/06/13 revealed 2.5 cm mass in the left neck below the left ear, which  predominately has a complex cystic appearance, although some  internal blood flow is evident. This may reflect a necrotic enlarged lymph node. A smaller mass is seen adjacent to this, with a cystic component that measures just over 6 mm, but overall measures 13 mm in longest dimension. This also may reflect a lymph node. Note is also made of an ill-defined 1.6 cm nodule in the right lobe of the thyroid with calcifications.   Follow-up CT was performed 08/14/13 demonstrating 2 x 2.6 x 1.8 cm mass lateral to the left sternocleidomastoid muscle immediately inferior to the parotid gland corresponds to the patient's palpable abnormality. Etiology indeterminate. Malignancy is of concern whether lymphoma or metastatic lymph node. Scattered normal sized lymph nodes throughout remainder of the neck including top normal size right level IV 1.1 x 0.8 cm lymph node.   The patient presents today for image guided left neck mass biopsy. He denies any chest pain or shortness of breath, denies any recent illness, fever, chills or night sweats. He denies any change in weight of appetite. He denies any blood in his stool or urine.    Past Medical History:  Past Medical History  Diagnosis Date  . Malaria     while in the service  . Peripheral vascular disease     slow wound healing on legs  . Acid reflux   . Hypertension   . Hypertension     requires diuretic    Past Surgical History:  Past Surgical History  Procedure Laterality Date  . Tonsillectomy      Family History: No family history on file.  Social History:  reports that he has never smoked. He does not have any smokeless  tobacco history on file. He reports that he does not drink alcohol or use illicit drugs.  Allergies:  Allergies  Allergen Reactions  . Codeine Nausea And Vomiting  . Hydrocodone Nausea And Vomiting  . Percocet [Oxycodone-Acetaminophen] Nausea And Vomiting      Medication List    ASK your doctor about these medications       acetaminophen 325 MG tablet  Commonly known as:  TYLENOL  Take 325 mg by mouth every 6 (six) hours as needed for pain.     aspirin EC 81 MG tablet  Take 81 mg by mouth daily.     enalapril 20 MG tablet  Commonly known as:  VASOTEC  Take 20 mg by mouth daily.     furosemide 20 MG tablet  Commonly known as:  LASIX  Take 20 mg by mouth daily. Take 1 tablet by mouth  every day     levothyroxine 88 MCG tablet  Commonly known as:  SYNTHROID, LEVOTHROID  Take 88 mcg by mouth daily before breakfast.     mometasone 0.1 % ointment  Commonly known as:  ELOCON  Apply 1 application topically 2 (two) times daily.     omeprazole 20 MG capsule  Commonly known as:  PRILOSEC  Take 20 mg by mouth daily.        Please HPI for pertinent positives, otherwise complete 10 system ROS negative.  Physical Exam: BP  198/105  Pulse 75  Temp(Src) 97.4 F (36.3 C) (Oral)  Resp 18  Ht 6\' 1"  (1.854 m)  Wt 208 lb (94.348 kg)  BMI 27.45 kg/m2  SpO2 98% Body mass index is 27.45 kg/(m^2).   General Appearance:  Alert, cooperative, no distress, appears stated age  Head:  Normocephalic, without obvious abnormality, atraumatic  Neck: Left neck mass visible and palpable, trachea midline  Lungs:   Clear to auscultation bilaterally, no w/r/r, respirations unlabored without use of accessory muscles.  Chest Wall:  No tenderness or deformity  Heart:  Regular rate and rhythm, S1, S2 normal, no murmur, rub or gallop.  Abdomen:   Soft, non-tender, non distended.  Extremities: Extremities normal, atraumatic, no cyanosis or edema  Pulses: 1+ and symmetric  Neurologic: Normal  affect, no gross deficits.   Recent Results (from the past 2160 hour(s))  APTT     Status: None   Collection Time    08/12/13  8:49 AM      Result Value Range   aPTT 24  24 - 37 seconds  CBC     Status: None   Collection Time    08/12/13  8:49 AM      Result Value Range   WBC 4.2  4.0 - 10.5 K/uL   RBC 4.89  4.22 - 5.81 MIL/uL   Hemoglobin 15.2  13.0 - 17.0 g/dL   HCT 44.5  39.0 - 52.0 %   MCV 91.0  78.0 - 100.0 fL   MCH 31.1  26.0 - 34.0 pg   MCHC 34.2  30.0 - 36.0 g/dL   RDW 13.5  11.5 - 15.5 %   Platelets PLATELET CLUMPS NOTED ON SMEAR, UNABLE TO ESTIMATE  150 - 400 K/uL  PROTIME-INR     Status: None   Collection Time    08/12/13  8:49 AM      Result Value Range   Prothrombin Time 13.0  11.6 - 15.2 seconds   INR 1.00  0.00 - 1.49   Assessment/Plan Left neck mass. [place] 08/06/13, CT 08/14/13 Scheduled today for image guided left neck mass biopsy.  Labs reviewed, patient has been NPO. Risks and Benefits discussed with the patient and his family. All of the patient's questions were answered, patient is agreeable to proceed. Consent signed and in chart.   Tsosie Billing D PA-C 08/19/2013, 9:48 AM

## 2013-08-19 NOTE — H&P (Signed)
Agree with PA note.  Signed,  Draedyn Weidinger K. Deandre Stansel, MD [facility]  Radiology  

## 2013-08-26 ENCOUNTER — Other Ambulatory Visit: Payer: Self-pay | Admitting: Family Medicine

## 2013-08-26 DIAGNOSIS — C8598 Non-Hodgkin lymphoma, unspecified, lymph nodes of multiple sites: Secondary | ICD-10-CM

## 2013-08-27 ENCOUNTER — Telehealth: Payer: Self-pay | Admitting: Oncology

## 2013-08-27 NOTE — Telephone Encounter (Signed)
S/w pt and gve np appt 10/01 @ 3 w/Dr. Beryle Beams Referring Dr. Jenna Luo Dx- NHL  Welcome packet at registration.

## 2013-08-28 ENCOUNTER — Ambulatory Visit (HOSPITAL_BASED_OUTPATIENT_CLINIC_OR_DEPARTMENT_OTHER): Payer: Medicare Other

## 2013-08-28 ENCOUNTER — Ambulatory Visit (HOSPITAL_BASED_OUTPATIENT_CLINIC_OR_DEPARTMENT_OTHER): Payer: Medicare Other | Admitting: Oncology

## 2013-08-28 ENCOUNTER — Encounter: Payer: Self-pay | Admitting: Oncology

## 2013-08-28 ENCOUNTER — Telehealth: Payer: Self-pay | Admitting: Oncology

## 2013-08-28 VITALS — BP 180/90 | HR 72 | Temp 96.9°F | Resp 19 | Ht 73.0 in | Wt 207.5 lb

## 2013-08-28 DIAGNOSIS — R591 Generalized enlarged lymph nodes: Secondary | ICD-10-CM

## 2013-08-28 DIAGNOSIS — R599 Enlarged lymph nodes, unspecified: Secondary | ICD-10-CM

## 2013-08-28 DIAGNOSIS — E041 Nontoxic single thyroid nodule: Secondary | ICD-10-CM

## 2013-08-28 DIAGNOSIS — R590 Localized enlarged lymph nodes: Secondary | ICD-10-CM

## 2013-08-28 LAB — COMPREHENSIVE METABOLIC PANEL (CC13)
ALT: 11 U/L (ref 0–55)
AST: 20 U/L (ref 5–34)
Albumin: 4.2 g/dL (ref 3.5–5.0)
Alkaline Phosphatase: 73 U/L (ref 40–150)
BUN: 17.4 mg/dL (ref 7.0–26.0)
CO2: 26 mEq/L (ref 22–29)
Calcium: 9.7 mg/dL (ref 8.4–10.4)
Chloride: 107 mEq/L (ref 98–109)
Creatinine: 1.2 mg/dL (ref 0.7–1.3)
Glucose: 102 mg/dl (ref 70–140)
Potassium: 4.4 mEq/L (ref 3.5–5.1)
Sodium: 144 mEq/L (ref 136–145)
Total Bilirubin: 0.54 mg/dL (ref 0.20–1.20)
Total Protein: 7.6 g/dL (ref 6.4–8.3)

## 2013-08-28 LAB — CBC WITH DIFFERENTIAL/PLATELET
BASO%: 0.8 % (ref 0.0–2.0)
Basophils Absolute: 0 10*3/uL (ref 0.0–0.1)
EOS%: 1.8 % (ref 0.0–7.0)
Eosinophils Absolute: 0.1 10*3/uL (ref 0.0–0.5)
HCT: 42.1 % (ref 38.4–49.9)
HGB: 14.3 g/dL (ref 13.0–17.1)
LYMPH%: 31.1 % (ref 14.0–49.0)
MCH: 31.1 pg (ref 27.2–33.4)
MCHC: 34 g/dL (ref 32.0–36.0)
MCV: 91.5 fL (ref 79.3–98.0)
MONO#: 0.5 10*3/uL (ref 0.1–0.9)
MONO%: 8.9 % (ref 0.0–14.0)
NEUT#: 2.9 10*3/uL (ref 1.5–6.5)
NEUT%: 57.4 % (ref 39.0–75.0)
Platelets: 144 10*3/uL (ref 140–400)
RBC: 4.6 10*6/uL (ref 4.20–5.82)
RDW: 14.4 % (ref 11.0–14.6)
WBC: 5.1 10*3/uL (ref 4.0–10.3)
lymph#: 1.6 10*3/uL (ref 0.9–3.3)

## 2013-08-28 LAB — URIC ACID (CC13): Uric Acid, Serum: 6.6 mg/dl (ref 2.6–7.4)

## 2013-08-28 LAB — LACTATE DEHYDROGENASE (CC13): LDH: 206 U/L (ref 125–245)

## 2013-08-28 NOTE — Telephone Encounter (Signed)
Gave pt appt for MD visit for 09/18/13, pt will se Dr. Wilburn Cornelia on 08/30/13, gave referral to Benedetto Goad for CT, also gave referral to HIM for ENT appt on Friday

## 2013-08-28 NOTE — Progress Notes (Signed)
Checked in new patient with no financial issues. Mail and phone number for communication.

## 2013-08-29 ENCOUNTER — Encounter: Payer: Self-pay | Admitting: Oncology

## 2013-08-29 DIAGNOSIS — R590 Localized enlarged lymph nodes: Secondary | ICD-10-CM | POA: Insufficient documentation

## 2013-08-29 LAB — HEPATITIS PANEL, ACUTE
HCV Ab: NEGATIVE
Hep A IgM: NEGATIVE
Hep B C IgM: NEGATIVE
Hepatitis B Surface Ag: NEGATIVE

## 2013-08-29 NOTE — Progress Notes (Signed)
New Patient Hematology-Oncology Evaluation   [name] [id number] July 25, 1919 79 y.o. 08/29/2013  CC: Dr. 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 About 3 weeks ago, he noticed a painless lump in his left neck while shaving. He saw his primary care physician. He was referred for an ultrasound of the neck done on September 9. This showed a 2.5 x 1.5 x 1.9 cm mass in the left neck. Incidentally noted was a ill-defined right thyroid nodule with several calcifications. This was followed with a CT scan of the neck done on September 17 which confirmed the presence of a 2 x 2.6 x 1.8 lymph node mass lateral to the left sternocleidomastoid muscle immediately inferior to the parotid gland. Scattered normal sized lymph nodes were seen throughout the remainder of the neck. The thyroid nodule was not well defined on this study. He was referred for a needle aspiration biopsy done on September 22. Pathology showed a monoclonal population of B lymphocytes consistent with a lymphoproliferative disorder. Of note a blood count done on September 15 showed a total white count 4200, no differential reported, hemoglobin 15, hematocrit 44.5, platelets were clumped. Repeat study in our office today with hemoglobin 14.3, hematocrit 42, white count 5100 with 57% neutrophils, 31 lymphocytes, 9 monocytes, platelets 144,000.  He denies any fevers, anorexia, weight loss, or night sweats. He has had long-standing exposure to insecticides but no organic solvent exposure and no therapeutic radiation treatments. He is one of 10 children and has only one surviving brother and 2 sisters.   PMH: Past Medical History  Diagnosis Date  . Malaria     while in the service  . Peripheral vascular disease     slow wound healing on legs  . Acid  reflux   . Hypertension   . 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 No history of hepatitis, tuberculosis, mononucleosis. No kidney problems, renal stones, seizure, stroke, blood clots. No diabetes. No stomach ulcers. No emphysema or asthma.  Past Surgical History  Procedure Laterality Date  . Tonsillectomy      Allergies: Allergies  Allergen Reactions  . Codeine Nausea And Vomiting  . Hydrocodone Nausea And Vomiting  . Percocet [Oxycodone-Acetaminophen] Nausea And Vomiting    Medications: Aspirin 81 mg daily, Vasotec 20 mg daily, Lasix 20 mg daily, Synthroid 88 mcg daily, Elocon cream to nonhealing ulcer of his leg twice a day, Prilosec 20 mg daily.   Social History: Married. Wife also 55 years old he has advanced Alzheimer's disease. 4 children 2 boys and 2 girls 2 of his sons and one of his daughters accompanies him today.  reports that he has never smoked. He does not have any smokeless tobacco history on file. He reports that he does not drink alcohol or use illicit drugs.he chewed tobacco for many years stopped 20 years ago  Family History: See above  Review of Systems: Constitutional symptoms: See above HEENT: No sore throat Respiratory: No cough or dyspnea Cardiovascular:  No chest pain or palpitations Gastrointestinal ROS: No abdominal pain or change in bowel habit Genito-Urinary ROS: No urinary tract symptoms Hematological and Lymphatic: See above Musculoskeletal: No muscle bone or joint pain Neurologic: No headache or change in vision Dermatologic: No rash or ecchymosis Remaining ROS negative.  Physical Exam: Blood pressure 180/90, pulse 72, temperature 96.9 F (36.1 C), temperature source Oral, resp. rate 19, height 6\' 1"  (1.854 m), weight 207 lb 8 oz (94.121 kg). Wt Readings from Last 3 Encounters:  08/28/13 207 lb 8 oz (94.121 kg)  08/19/13 208 lb (94.348 kg)  08/05/13 208 lb (94.348 kg)    General appearance:  Tall Caucasian man, well-nourished HENNT: Pharynx no erythema, exudate, mass, or tonsil residual. No thyromegaly or thyroid mass Lymph nodes: Approximate 3 cm left anterior cervical lymph node mass no cervical, supraclavicular, axillary, or inguinal adenopathy Breasts: Lungs: Clear to auscultation resonant to percussion Heart: Regular rhythm no murmur Vascular: No cyanosis, no carotid bruits Abdominal: Soft, nontender, no mass, no organomegaly GU: Extremities: 2+ pedal edema, no calf tenderness Neurologic: Mental status intact, corneal scarring left eye with no light reflex right pupil reactive and optic discs sharp , motor strength 5 over 5, reflexes absent symmetric at the knees, 1+ symmetric at the biceps Skin: No rash or ecchymosis    Lab Results: Lab Results  Component Value Date   WBC 5.1 08/28/2013   HGB 14.3 08/28/2013   HCT 42.1 08/28/2013   MCV 91.5 08/28/2013   PLT 144 08/28/2013     Chemistry      Component Value Date/Time   NA 144 08/28/2013 1602   NA 143 02/13/2012 1359   K 4.4 08/28/2013 1602   K 5.1 02/13/2012 1359   CL 106 02/13/2012 1359   CO2 26 08/28/2013 1602   CO2 28 02/13/2012 1340   BUN 17.4 08/28/2013 1602   BUN 23 02/13/2012 1359   CREATININE 1.2 08/28/2013 1602   CREATININE 1.40* 02/13/2012 1359      Component Value Date/Time   CALCIUM 9.7 08/28/2013 1602   CALCIUM 9.7 02/13/2012 1340   ALKPHOS 73 08/28/2013 1602   ALKPHOS 61 01/22/2012 2131   AST 20 08/28/2013 1602   AST 25 01/22/2012 2131   ALT 11 08/28/2013 1602   ALT 14 01/22/2012 2131   BILITOT 0.54 08/28/2013 1602   BILITOT 0.6 01/22/2012 2131       Pathology: See discussion above   Radiological Studies: See discussion above    Impression and Plan: Isolated left cervical lymph node and a elderly man with preliminary findings suggesting lymphoma.  I had a lengthy discussion with the patient and his family. I feel it is important to do a staging evaluation with CT scan of his chest abdomen and  pelvis and to refer him for a proper lymph node biopsy. Adequate tissue is necessary for a determination of subtype of lymphoma ,igh grade versus low grade, nd will determine prognosis and treatment options. He clearly does not want any aggressive treatment but I reassured him that there are many treatments available for lymphoma that would have minimal toxicity. If we don't find any other areas of significant adenopathy, he could be treated with involved field radiation. If we find high-grade lymphoma, we would then need to have a discussion about whether or not to offer him chemotherapy. If he has more extensive low-grade lymphoma, he might be amenable to treatment with single agent Rituxan antibody which would not cause any hair loss, nausea, vomiting, or low blood counts.  He agrees to proceed with staging evaluation and incisional biopsy as recommended above. I have made a referral to Dr. Lurlean Leyden office for a lymph node biopsy and I have scheduled him for CT scans without contrast in view of his creatinine of 1.4.  Over one hour spent with this patient and his family with more than 50% inounseling      Annia Belt, MD 08/29/2013, 3:33 PM

## 2013-08-30 ENCOUNTER — Telehealth: Payer: Self-pay | Admitting: Oncology

## 2013-08-30 ENCOUNTER — Other Ambulatory Visit (HOSPITAL_COMMUNITY): Payer: Self-pay | Admitting: *Deleted

## 2013-08-30 ENCOUNTER — Ambulatory Visit (HOSPITAL_COMMUNITY)
Admission: RE | Admit: 2013-08-30 | Discharge: 2013-08-30 | Disposition: A | Discharge status: Home / Self Care | Payer: Medicare Other | Source: Ambulatory Visit | Attending: Oncology | Admitting: Oncology

## 2013-08-30 ENCOUNTER — Encounter (HOSPITAL_COMMUNITY): Payer: Self-pay | Admitting: Respiratory Therapy

## 2013-08-30 DIAGNOSIS — K5730 Diverticulosis of large intestine without perforation or abscess without bleeding: Secondary | ICD-10-CM | POA: Insufficient documentation

## 2013-08-30 DIAGNOSIS — I700 Atherosclerosis of aorta: Secondary | ICD-10-CM | POA: Insufficient documentation

## 2013-08-30 DIAGNOSIS — N400 Enlarged prostate without lower urinary tract symptoms: Secondary | ICD-10-CM | POA: Insufficient documentation

## 2013-08-30 DIAGNOSIS — C8589 Other specified types of non-Hodgkin lymphoma, extranodal and solid organ sites: Secondary | ICD-10-CM | POA: Insufficient documentation

## 2013-08-30 DIAGNOSIS — R591 Generalized enlarged lymph nodes: Secondary | ICD-10-CM

## 2013-08-30 DIAGNOSIS — K449 Diaphragmatic hernia without obstruction or gangrene: Secondary | ICD-10-CM | POA: Insufficient documentation

## 2013-08-30 NOTE — Pre-Procedure Instructions (Signed)
[name]  08/30/2013   Your procedure is scheduled on:  Thursday, September 05, 2013 at 9:30 AM.    Report to [facility] Entrance "A" at 7:30 AM.   Call this number if you have problems the morning of surgery: [phone]   Remember:   Do not eat food or drink liquids after midnight Wednesday, 09/04/2013   Take these medicines the morning of surgery with A SIP OF WATER: omeprazole (PRILOSEC), levothyroxine (SYNTHROID, LEVOTHROID)  Stop Aspirin as of today, 09/02/13.     Do not wear jewelry  Do not wear lotions, powders, or cologne. You may wear deodorant.  Do not shave 48 hours prior to surgery. Men may shave face and neck.  Do not bring valuables to the hospital.  [facility] is not responsible                  for any belongings or valuables.               Contacts, dentures or bridgework may not be worn into surgery.  Leave suitcase in the car. After surgery it may be brought to your room.  For patients admitted to the hospital, discharge time is determined by your                treatment team.               Patients discharged the day of surgery will not be allowed to drive  home.  Name and phone number of your driver: Family/friend   Special Instructions: Shower using CHG 2 nights before surgery and the night before surgery.  If you shower the day of surgery use CHG.  Use special wash - you have one bottle of CHG for all showers.  You should use approximately 1/3 of the bottle for each shower.   Please read over the following fact sheets that you were given: Pain Booklet, Coughing and Deep Breathing and Surgical Site Infection Prevention

## 2013-08-30 NOTE — Telephone Encounter (Signed)
Faxed pt medical records to ent

## 2013-09-02 ENCOUNTER — Ambulatory Visit (HOSPITAL_COMMUNITY)
Admission: RE | Admit: 2013-09-02 | Discharge: 2013-09-02 | Disposition: A | Discharge status: Home / Self Care | Payer: Medicare Other | Source: Ambulatory Visit | Attending: Otolaryngology | Admitting: Otolaryngology

## 2013-09-02 ENCOUNTER — Encounter (HOSPITAL_COMMUNITY)
Admission: RE | Admit: 2013-09-02 | Discharge: 2013-09-02 | Disposition: A | Discharge status: Home / Self Care | Payer: Medicare Other | Source: Ambulatory Visit | Attending: Otolaryngology | Admitting: Otolaryngology

## 2013-09-02 ENCOUNTER — Encounter (HOSPITAL_COMMUNITY): Payer: Self-pay

## 2013-09-02 ENCOUNTER — Telehealth: Payer: Self-pay | Admitting: *Deleted

## 2013-09-02 ENCOUNTER — Other Ambulatory Visit: Payer: Self-pay | Admitting: Otolaryngology

## 2013-09-02 DIAGNOSIS — I4949 Other premature depolarization: Secondary | ICD-10-CM | POA: Insufficient documentation

## 2013-09-02 DIAGNOSIS — Z01810 Encounter for preprocedural cardiovascular examination: Secondary | ICD-10-CM | POA: Insufficient documentation

## 2013-09-02 DIAGNOSIS — R9431 Abnormal electrocardiogram [ECG] [EKG]: Secondary | ICD-10-CM | POA: Insufficient documentation

## 2013-09-02 DIAGNOSIS — M899 Disorder of bone, unspecified: Secondary | ICD-10-CM | POA: Insufficient documentation

## 2013-09-02 DIAGNOSIS — I440 Atrioventricular block, first degree: Secondary | ICD-10-CM | POA: Insufficient documentation

## 2013-09-02 DIAGNOSIS — Z01812 Encounter for preprocedural laboratory examination: Secondary | ICD-10-CM | POA: Insufficient documentation

## 2013-09-02 DIAGNOSIS — I10 Essential (primary) hypertension: Secondary | ICD-10-CM | POA: Insufficient documentation

## 2013-09-02 HISTORY — DX: Hypothyroidism, unspecified: E03.9

## 2013-09-02 LAB — CBC
HCT: 42.6 % (ref 39.0–52.0)
Hemoglobin: 14.2 g/dL (ref 13.0–17.0)
MCH: 30.4 pg (ref 26.0–34.0)
MCHC: 33.3 g/dL (ref 30.0–36.0)
MCV: 91.2 fL (ref 78.0–100.0)
Platelets: 142 10*3/uL — ABNORMAL LOW (ref 150–400)
RBC: 4.67 MIL/uL (ref 4.22–5.81)
RDW: 13.7 % (ref 11.5–15.5)
WBC: 4.2 10*3/uL (ref 4.0–10.5)

## 2013-09-02 LAB — BASIC METABOLIC PANEL
BUN: 15 mg/dL (ref 6–23)
CO2: 29 mEq/L (ref 19–32)
Calcium: 9.5 mg/dL (ref 8.4–10.5)
Chloride: 103 mEq/L (ref 96–112)
Creatinine, Ser: 1.18 mg/dL (ref 0.50–1.35)
GFR calc Af Amer: 60 mL/min — ABNORMAL LOW (ref >90)
GFR calc non Af Amer: 52 mL/min — ABNORMAL LOW (ref >90)
Glucose, Bld: 105 mg/dL — ABNORMAL HIGH (ref 70–99)
Potassium: 4.2 mEq/L (ref 3.5–5.1)
Sodium: 141 mEq/L (ref 135–145)

## 2013-09-02 NOTE — Telephone Encounter (Signed)
Spoke with dtr, Becky May and let her know that CT showed no other lymph glands enlarged.  She appreciated the call.

## 2013-09-02 NOTE — Telephone Encounter (Signed)
Message copied by Ignacia Felling on Mon Sep 02, 2013  9:35 AM ------      Message from: Annia Belt      Created: Fri Aug 30, 2013  7:32 PM       Call pt daughter:  No other lymph glands enlarged on CT scan! ------

## 2013-09-02 NOTE — Progress Notes (Signed)
Notified office pt needs orders, labs drawn per anesthesia.

## 2013-09-04 MED ORDER — CEFAZOLIN SODIUM-DEXTROSE 2-3 GM-% IV SOLR
2.0000 g | INTRAVENOUS | Status: AC
  Administered 2013-09-05: 2 g via INTRAVENOUS
  Filled 2013-09-04 (×2): qty 50

## 2013-09-05 ENCOUNTER — Encounter (HOSPITAL_COMMUNITY): Payer: Self-pay | Admitting: *Deleted

## 2013-09-05 ENCOUNTER — Encounter (HOSPITAL_COMMUNITY): Payer: Medicare Other | Admitting: Anesthesiology

## 2013-09-05 ENCOUNTER — Encounter (HOSPITAL_COMMUNITY)
Admission: RE | Disposition: A | Discharge status: Home / Self Care | Payer: Self-pay | Source: Ambulatory Visit | Attending: Otolaryngology

## 2013-09-05 ENCOUNTER — Ambulatory Visit (HOSPITAL_COMMUNITY): Payer: Medicare Other | Admitting: Anesthesiology

## 2013-09-05 ENCOUNTER — Ambulatory Visit (HOSPITAL_COMMUNITY)
Admission: RE | Admit: 2013-09-05 | Discharge: 2013-09-05 | Disposition: A | Discharge status: Home / Self Care | Payer: Medicare Other | Source: Ambulatory Visit | Attending: Otolaryngology | Admitting: Otolaryngology

## 2013-09-05 DIAGNOSIS — C801 Malignant (primary) neoplasm, unspecified: Secondary | ICD-10-CM

## 2013-09-05 DIAGNOSIS — R590 Localized enlarged lymph nodes: Secondary | ICD-10-CM

## 2013-09-05 DIAGNOSIS — I10 Essential (primary) hypertension: Secondary | ICD-10-CM | POA: Insufficient documentation

## 2013-09-05 DIAGNOSIS — C8581 Other specified types of non-Hodgkin lymphoma, lymph nodes of head, face, and neck: Secondary | ICD-10-CM | POA: Insufficient documentation

## 2013-09-05 DIAGNOSIS — Z79899 Other long term (current) drug therapy: Secondary | ICD-10-CM | POA: Insufficient documentation

## 2013-09-05 HISTORY — PX: MASS BIOPSY: SHX5445

## 2013-09-05 HISTORY — DX: Malignant (primary) neoplasm, unspecified: C80.1

## 2013-09-05 SURGERY — BIOPSY, MASS, NECK
Anesthesia: General | Site: Neck | Laterality: Left | Wound class: Clean

## 2013-09-05 MED ORDER — SODIUM CHLORIDE 0.9 % IR SOLN
Status: DC | PRN
  Administered 2013-09-05: 1

## 2013-09-05 MED ORDER — EPHEDRINE SULFATE 50 MG/ML IJ SOLN
INTRAMUSCULAR | Status: DC | PRN
  Administered 2013-09-05 (×3): 10 mg via INTRAVENOUS

## 2013-09-05 MED ORDER — LIDOCAINE-EPINEPHRINE 1 %-1:100000 IJ SOLN
INTRAMUSCULAR | Status: DC | PRN
  Administered 2013-09-05: 10 mL

## 2013-09-05 MED ORDER — LIDOCAINE-EPINEPHRINE 1 %-1:100000 IJ SOLN
INTRAMUSCULAR | Status: AC
  Filled 2013-09-05: qty 1

## 2013-09-05 MED ORDER — FENTANYL CITRATE 0.05 MG/ML IJ SOLN: 25.0000 ug | INTRAMUSCULAR | Status: DC | PRN

## 2013-09-05 MED ORDER — PROPOFOL 10 MG/ML IV BOLUS
INTRAVENOUS | Status: DC | PRN
  Administered 2013-09-05: 150 mg via INTRAVENOUS
  Administered 2013-09-05: 50 mg via INTRAVENOUS

## 2013-09-05 MED ORDER — FENTANYL CITRATE 0.05 MG/ML IJ SOLN
INTRAMUSCULAR | Status: DC | PRN
  Administered 2013-09-05 (×2): 50 ug via INTRAVENOUS

## 2013-09-05 MED ORDER — ASPIRIN EC 81 MG PO TBEC: 81.0000 mg | DELAYED_RELEASE_TABLET | Freq: Every day | ORAL | Status: DC

## 2013-09-05 MED ORDER — LIDOCAINE HCL (CARDIAC) 20 MG/ML IV SOLN
INTRAVENOUS | Status: DC | PRN
  Administered 2013-09-05: 80 mg via INTRAVENOUS

## 2013-09-05 MED ORDER — ONDANSETRON HCL 4 MG/2ML IJ SOLN
INTRAMUSCULAR | Status: DC | PRN
  Administered 2013-09-05: 4 mg via INTRAMUSCULAR

## 2013-09-05 MED ORDER — LACTATED RINGERS IV SOLN
INTRAVENOUS | Status: DC | PRN
  Administered 2013-09-05 (×2): via INTRAVENOUS

## 2013-09-05 MED ORDER — LACTATED RINGERS IV SOLN
INTRAVENOUS | Status: DC
  Administered 2013-09-05: 08:00:00 via INTRAVENOUS

## 2013-09-05 MED ORDER — GLYCOPYRROLATE 0.2 MG/ML IJ SOLN
INTRAMUSCULAR | Status: DC | PRN
  Administered 2013-09-05: 0.2 mg via INTRAVENOUS

## 2013-09-05 MED ORDER — ONDANSETRON HCL 4 MG/2ML IJ SOLN: 4.0000 mg | Freq: Once | INTRAMUSCULAR | Status: DC | PRN

## 2013-09-05 MED ORDER — HYDROCODONE-ACETAMINOPHEN 5-325 MG PO TABS: 1.0000 | ORAL_TABLET | Freq: Four times a day (QID) | ORAL | Status: DC | PRN

## 2013-09-05 SURGICAL SUPPLY — 45 items
ADH SKN CLS LQ APL DERMABOND (GAUZE/BANDAGES/DRESSINGS) ×1
ATTRACTOMAT 16X20 MAGNETIC DRP (DRAPES) IMPLANT
BANDAGE GAUZE ELAST BULKY 4 IN (GAUZE/BANDAGES/DRESSINGS) IMPLANT
CANISTER SUCTION 2500CC (MISCELLANEOUS) ×2 IMPLANT
CATH FOLEY 2WAY SLVR  5CC 14FR (CATHETERS)
CATH FOLEY 2WAY SLVR 5CC 14FR (CATHETERS) IMPLANT
CLEANER TIP ELECTROSURG 2X2 (MISCELLANEOUS) ×2 IMPLANT
CLOTH BEACON ORANGE TIMEOUT ST (SAFETY) ×2 IMPLANT
CONT SPEC 4OZ CLIKSEAL STRL BL (MISCELLANEOUS) ×2 IMPLANT
CORDS BIPOLAR (ELECTRODE) ×2 IMPLANT
COVER SURGICAL LIGHT HANDLE (MISCELLANEOUS) ×2 IMPLANT
DERMABOND ADHESIVE PROPEN (GAUZE/BANDAGES/DRESSINGS) ×1
DERMABOND ADVANCED .7 DNX6 (GAUZE/BANDAGES/DRESSINGS) IMPLANT
DRAIN JACKSON PRT FLT 10 (DRAIN) IMPLANT
DRAIN PENROSE 1/4X12 LTX STRL (WOUND CARE) IMPLANT
ELECT COATED BLADE 2.86 ST (ELECTRODE) ×2 IMPLANT
ELECT REM PT RETURN 9FT ADLT (ELECTROSURGICAL) ×2
ELECT REM PT RETURN 9FT PED (ELECTROSURGICAL)
ELECTRODE REM PT RETRN 9FT PED (ELECTROSURGICAL) IMPLANT
ELECTRODE REM PT RTRN 9FT ADLT (ELECTROSURGICAL) ×1 IMPLANT
EVACUATOR SILICONE 100CC (DRAIN) IMPLANT
GLOVE BIOGEL M 7.0 STRL (GLOVE) ×4 IMPLANT
GOWN STRL NON-REIN LRG LVL3 (GOWN DISPOSABLE) ×4 IMPLANT
KIT BASIN OR (CUSTOM PROCEDURE TRAY) ×2 IMPLANT
KIT ROOM TURNOVER OR (KITS) ×2 IMPLANT
LOCATOR NERVE 3 VOLT (DISPOSABLE) IMPLANT
NS IRRIG 1000ML POUR BTL (IV SOLUTION) ×2 IMPLANT
PAD ARMBOARD 7.5X6 YLW CONV (MISCELLANEOUS) ×4 IMPLANT
PENCIL BUTTON HOLSTER BLD 10FT (ELECTRODE) ×2 IMPLANT
SPONGE GAUZE 4X4 12PLY (GAUZE/BANDAGES/DRESSINGS) IMPLANT
STAPLER VISISTAT 35W (STAPLE) ×2 IMPLANT
SUT ETHILON 3 0 PS 1 (SUTURE) IMPLANT
SUT ETHILON 5 0 PS 2 18 (SUTURE) IMPLANT
SUT SILK 2 0 FS (SUTURE) IMPLANT
SUT SILK 3 0 REEL (SUTURE) IMPLANT
SUT VIC AB 3-0 PS2 18 (SUTURE)
SUT VIC AB 3-0 PS2 18XBRD (SUTURE) IMPLANT
SUT VIC AB 4-0 P-3 18X BRD (SUTURE) IMPLANT
SUT VIC AB 4-0 P3 18 (SUTURE)
SWAB COLLECTION DEVICE MRSA (MISCELLANEOUS) IMPLANT
TOWEL OR 17X24 6PK STRL BLUE (TOWEL DISPOSABLE) ×2 IMPLANT
TOWEL OR 17X26 10 PK STRL BLUE (TOWEL DISPOSABLE) ×2 IMPLANT
TRAY ENT MC OR (CUSTOM PROCEDURE TRAY) ×2 IMPLANT
TUBE ANAEROBIC SPECIMEN COL (MISCELLANEOUS) IMPLANT
WATER STERILE IRR 1000ML POUR (IV SOLUTION) ×2 IMPLANT

## 2013-09-05 NOTE — H&P (Signed)
[name] is an 79 y.o. male.   Chief Complaint: Left Neck Mass HPI: Prog Left neck mass, bx positive for lymphoma  Past Medical History  Diagnosis Date  . Malaria     while in the service  . Peripheral vascular disease     slow wound healing on legs  . Acid reflux   . Hypertension   . Hypertension     requires diuretic  . Lymphadenopathy, cervical 08/29/2013  . Hypothyroidism     Past Surgical History  Procedure Laterality Date  . Tonsillectomy      History reviewed. No pertinent family history. Social History:  reports that he has never smoked. He does not have any smokeless tobacco history on file. He reports that he does not drink alcohol or use illicit drugs.  Allergies:  Allergies  Allergen Reactions  . Codeine Nausea And Vomiting  . Hydrocodone Nausea And Vomiting  . Percocet [Oxycodone-Acetaminophen] Nausea And Vomiting    Medications Prior to Admission  Medication Sig Dispense Refill  . acetaminophen (TYLENOL) 325 MG tablet Take 325 mg by mouth every 6 (six) hours as needed for pain.       Marland Kitchen aspirin EC 81 MG tablet Take 81 mg by mouth daily.      . enalapril (VASOTEC) 20 MG tablet Take 20 mg by mouth daily.      . furosemide (LASIX) 20 MG tablet Take 20 mg by mouth daily. Take 1 tablet by mouth  every day      . levothyroxine (SYNTHROID, LEVOTHROID) 88 MCG tablet Take 88 mcg by mouth daily before breakfast.      . mometasone (ELOCON) 0.1 % ointment Apply 1 application topically 2 (two) times daily.      Marland Kitchen omeprazole (PRILOSEC) 20 MG capsule Take 20 mg by mouth daily.        No results found for this or any previous visit (from the past 48 hour(s)). No results found.  Review of Systems  Constitutional: Negative.   Respiratory: Negative.   Cardiovascular: Negative.   Gastrointestinal: Negative.   Neurological: Negative.     Pulse 68, temperature 96.9 F (36.1 C), temperature source Oral, resp. rate 18, SpO2 99.00%. Physical Exam  Constitutional: He  is oriented to person, place, and time. He appears well-developed and well-nourished.  HENT:  Head: Normocephalic.  Mouth/Throat: Oropharynx is clear and moist.  Neck: Normal range of motion.  Cardiovascular: Normal rate.   Respiratory: Effort normal.  GI: Soft.  Musculoskeletal: Normal range of motion.  Lymphadenopathy:    He has cervical adenopathy.  Neurological: He is alert and oriented to person, place, and time.     Assessment/Plan Adm for OP bx of Left neck mass  [name] 09/05/2013, 9:26 AM

## 2013-09-05 NOTE — Brief Op Note (Signed)
09/05/2013  10:37 AM  PATIENT:  [name]  79 y.o. male  PRE-OPERATIVE DIAGNOSIS:  LEFT NECK MASS  POST-OPERATIVE DIAGNOSIS:  LEFT NECK MASS  PROCEDURE:  Procedure(s): EXCISIONAL BIOPSY OF LEFT NECK MASS (Left)  SURGEON:  Surgeon(s) and Role:    * Jerrell Belfast, MD - Primary  PHYSICIAN ASSISTANT:   ASSISTANTS: none   ANESTHESIA:   general  EBL:  Total I/O In: 1000 [I.V.:1000] Out: 300 [Urine:300]  BLOOD ADMINISTERED:none  DRAINS: none   LOCAL MEDICATIONS USED:  LIDOCAINE  and Amount: 1 ml  SPECIMEN:  Source of Specimen:  Left neck mass  DISPOSITION OF SPECIMEN:  PATHOLOGY  COUNTS:  YES  TOURNIQUET:  * No tourniquets in log *  DICTATION: .Other Dictation: Dictation Number [id number]  PLAN OF CARE: Discharge to home after PACU  PATIENT DISPOSITION:  PACU - hemodynamically stable.   Delay start of Pharmacological VTE agent (>24hrs) due to surgical blood loss or risk of bleeding: not applicable

## 2013-09-05 NOTE — Anesthesia Preprocedure Evaluation (Addendum)
Anesthesia Evaluation  Patient identified by MRN, date of birth, ID band Patient awake    Reviewed: Allergy & Precautions, H&P , NPO status , Patient's Chart, lab work & pertinent test results  History of Anesthesia Complications Negative for: history of anesthetic complications  Airway Mallampati: I TM Distance: >3 FB     Dental  (+) Edentulous Upper, Edentulous Lower and Dental Advisory Given   Pulmonary neg pulmonary ROS,  breath sounds clear to auscultation        Cardiovascular hypertension, Pt. on medications + Peripheral Vascular Disease Rhythm:Regular Rate:Normal     Neuro/Psych negative neurological ROS  negative psych ROS   GI/Hepatic Neg liver ROS, GERD-  Medicated and Controlled,  Endo/Other  Hypothyroidism   Renal/GU negative Renal ROS     Musculoskeletal   Abdominal   Peds  Hematology  (+) Blood dyscrasia, , lymphoma   Anesthesia Other Findings   Reproductive/Obstetrics negative OB ROS                          Anesthesia Physical Anesthesia Plan  ASA: III  Anesthesia Plan: General   Post-op Pain Management:    Induction: Intravenous  Airway Management Planned: LMA  Additional Equipment:   Intra-op Plan:   Post-operative Plan: Extubation in OR  Informed Consent: I have reviewed the patients History and Physical, chart, labs and discussed the procedure including the risks, benefits and alternatives for the proposed anesthesia with the patient or authorized representative who has indicated his/her understanding and acceptance.   Dental advisory given  Plan Discussed with: CRNA, Anesthesiologist and Surgeon  Anesthesia Plan Comments:         Anesthesia Quick Evaluation

## 2013-09-05 NOTE — Preoperative (Signed)
Beta Blockers   Reason not to administer Beta Blockers:Not Applicable 

## 2013-09-05 NOTE — Transfer of Care (Signed)
Immediate Anesthesia Transfer of Care Note  Patient: [name]  Procedure(s) Performed: Procedure(s): EXCISIONAL BIOPSY OF LEFT NECK MASS (Left)  Patient Location: PACU  Anesthesia Type:General  Level of Consciousness: awake, alert , oriented and patient cooperative  Airway & Oxygen Therapy: Patient Spontanous Breathing and Patient connected to nasal cannula oxygen  Post-op Assessment: Report given to PACU RN and Post -op Vital signs reviewed and stable  Post vital signs: Reviewed  Complications: No apparent anesthesia complications

## 2013-09-05 NOTE — Anesthesia Postprocedure Evaluation (Signed)
  Anesthesia Post-op Note  Patient: [name]  Procedure(s) Performed: Procedure(s): EXCISIONAL BIOPSY OF LEFT NECK MASS (Left)  Patient Location: PACU  Anesthesia Type:General  Level of Consciousness: awake, alert  and oriented  Airway and Oxygen Therapy: Patient Spontanous Breathing and Patient connected to nasal cannula oxygen  Post-op Pain: none  Post-op Assessment: Post-op Vital signs reviewed  Post-op Vital Signs: Reviewed  Complications: No apparent anesthesia complications

## 2013-09-06 ENCOUNTER — Encounter (HOSPITAL_COMMUNITY): Payer: Self-pay | Admitting: Otolaryngology

## 2013-09-06 NOTE — Op Note (Signed)
NAMEBRAHIM, OGANDO NO.:  [id number]  MEDICAL RECORD NO.:  [id number]  LOCATION:  [facility]                         FACILITY:  [facility]  PHYSICIAN:  Early Chars. Wilburn Cornelia, M.D.DATE OF BIRTH:  1919-07-01  DATE OF PROCEDURE:  09/05/2013 DATE OF DISCHARGE:  09/05/2013                              OPERATIVE REPORT   PREOPERATIVE DIAGNOSES: 1. Left superior neck mass. 2. Lymphoma.  POSTOPERATIVE DIAGNOSES: 1. Left superior neck mass. 2. Lymphoma.  INDICATION FOR SURGERY: 1. Left superior neck mass. 2. Lymphoma.  SURGICAL PROCEDURE: Incisional biopsy, left superior neck mass.  ANESTHESIA:  General/LMA.  COMPLICATIONS:  None.  ESTIMATED BLOOD LOSS:  Minimal blood loss.  DISPOSITION:  The patient transferred from the operating room to the recovery room in stable condition.  BRIEF HISTORY:  The patient is a 79 year old, white male, who was referred by Dr. Beryle Beams of the Medical Oncology Service for biopsy of a left lateral neck mass.  The patient has several month history of gradually enlarging left lateral neck mass and workup including needle biopsy showed tissue consistent with lymphoma.  In order to better characterize the tumor, additional tissue was required for further workup.  The patient was referred to our office for evaluation.  A CT scan showed a 3 cm necrotic-appearing left superior lateral neck mass deep to the posterior sternocleidomastoid muscle on the left lateral neck.  No other palpable adenopathy was found and there were no mucosal lesions or ulcerations.  Given the patient's history and findings, I recommended incisional biopsy of the mass under general anesthesia.  The risks and benefits were discussed in detail with the patient and his daughter and they understood and concurred with our plan for surgery which is scheduled on elective basis at [facility] on September 05, 2013.  PROCEDURE IN DETAIL:  The patient was brought to the  operating room and placed in supine position on the operating table.  General LMA anesthesia was established without difficulty.  When the patient was adequately anesthetized, he was positioned on the operating table and prepped and draped in a sterile fashion.  His neck was injected with 1 mL of 1% lidocaine with 1:100,000 dilution epinephrine was injected in subcutaneous fashion overlying the left lateral neck mass.  After allowing adequate time for vasoconstriction hemostasis, the procedure was begun by creating a 2 cm horizontally oriented incision. This was carried through the skin and underlying subcutaneous tissue.  The posterior border of the sternocleidomastoid muscle was identified and reflected anteriorly.  The mass was dissected and consisted of several matted-appearing lymph nodes, which were densely adhered to the surrounding tissue, rather than attempt an excisional biopsy.  This felt more prudent to remove a portion of the lymph node for pathologic diagnosis and incisional.  Incisional biopsy was then undertaken, removing approximately 1 x 2 cm portion of lymph node tissue which was then sent to Pathology for lymphoma workup.  The margins of dissection were then cauterized with electrocautery and the incision was closed in multiple layers beginning with reapproximation of the deep muscular layer, followed by subcutaneous closure using 4-0 Vicryl suture.  The final skin edge was closed with Dermabond surgical  glue.  The patient was then awakened from his anesthetic, extubated, and transferred from the operating room to the recovery room in stable condition.  There were no complications.  The blood loss was minimal.          ______________________________ Early Chars. Wilburn Cornelia, M.D.     DLS/MEDQ  D:  [id number]  T:  09/06/2013  Job:  [id number]

## 2013-09-13 ENCOUNTER — Other Ambulatory Visit: Payer: Self-pay | Admitting: Oncology

## 2013-09-13 NOTE — Addendum Note (Signed)
Addended by: Wilburn Cornelia, Tawana Pasch on: 09/13/2013 05:37 PM   Modules accepted: Orders

## 2013-09-18 ENCOUNTER — Ambulatory Visit (HOSPITAL_BASED_OUTPATIENT_CLINIC_OR_DEPARTMENT_OTHER): Payer: Medicare Other | Admitting: Oncology

## 2013-09-18 ENCOUNTER — Telehealth: Payer: Self-pay | Admitting: Oncology

## 2013-09-18 VITALS — HR 78 | Temp 97.2°F | Resp 19 | Ht 73.0 in | Wt 210.2 lb

## 2013-09-18 DIAGNOSIS — C8590 Non-Hodgkin lymphoma, unspecified, unspecified site: Secondary | ICD-10-CM

## 2013-09-18 DIAGNOSIS — C8581 Other specified types of non-Hodgkin lymphoma, lymph nodes of head, face, and neck: Secondary | ICD-10-CM

## 2013-09-18 NOTE — Progress Notes (Signed)
[name] returns today with his son and daughter for further discussion on results of recent left cervical lymph node biopsy which showed a high-grade, B-cell, non-Hodgkin's lymphoma. CT scan of the chest abdomen and pelvis showed no other areas of obvious adenopathy. No splenomegaly. Serum LDH normal at 206.  Impression: Stage IA,  high-grade, B-cell, non-Hodgkin's lymphoma and a 79 year old man  I discussed at length that standard therapy would include both chemotherapy for a limited number of cycles plus involved field radiation. What we have learned from past experience is that radiation alone is not enough to prevent recurrence. I spent time reviewing the risks versus benefits and potential side effects of chemotherapy. At this point he is not interested in receiving chemotherapy but is willing to proceed with a radiation oncology consultation and I will make this referral today.  More than 30 minutes spent with direct face-to-face counseling with 100% of the time in counseling. Focused exam of the left neck where there is a persistent left cervical adenopathy approximately 4-5 cm.

## 2013-09-18 NOTE — Telephone Encounter (Signed)
Gave pt appt for lab,CT and Md for January 2014, see Radiation on 10/31

## 2013-09-27 ENCOUNTER — Ambulatory Visit: Payer: Medicare Other | Admitting: Radiation Oncology

## 2013-09-27 ENCOUNTER — Ambulatory Visit: Payer: Medicare Other

## 2013-10-03 ENCOUNTER — Other Ambulatory Visit: Payer: Self-pay

## 2013-10-03 ENCOUNTER — Encounter: Payer: Self-pay | Admitting: Radiation Oncology

## 2013-10-03 NOTE — Progress Notes (Signed)
Head and Neck Cancer Location of Tumor / Histology: Left mass on side of neck , Stage 1A,high grade ,B-cell,non-hodgkins lymphoma  Patient presented painless lump on left side of neck noticed back around September 2014   Biopsies of10/9/14:DiagnosisSoft tissue mass, simple excision, Left neck- DIFFUSE LARGE B-CELL LYMPHOMA  Nutrition Status:  Weight changes:No   Swallowing status:No difficulty.appetite good.  Plans, if any, for PEG tube:?   Tobacco/Marijuana/Snuff/ETOH use: chewed tobacco.Quit in 1980  Past/Anticipated interventions by otolaryngology, if any: Dr. Jerrell Belfast Biopsy done 09/05/13 with follow up 09/13/13   Past/Anticipated interventions by medical oncology, if any:  08/29/13 met with Dr.Granfortuna, Ct scan H/N 12/16/13,; next follow up appt  Dr.Granfortuna=12/17/2013,patient not interested in chemotherapy at this time,referral to Radiation Oncology  Referrals yet, to any of the following?  Social Work? no  Dentistry? no  Swallowing therapy? no  Nutrition? no  Med/Onc? seen by Dr.Granfortuna  PEG placement? no  SAFETY ISSUES: yes, blind left eye,  From chemical spray  Prior radiation? No  Pacemaker/ICD? No    Is the patient on methotrexate?No   Current Complaints / other details:  Hx Malaria while in [employer] service, PVD-slow  Wound healing on legs, tobacco farmer.Married, 4 sons and 2 daughters.Still lives home with wife whom has dementia.Patient very sharp/alert.

## 2013-10-04 ENCOUNTER — Encounter: Payer: Self-pay | Admitting: Radiation Oncology

## 2013-10-04 ENCOUNTER — Other Ambulatory Visit: Payer: Self-pay | Admitting: Family Medicine

## 2013-10-04 ENCOUNTER — Ambulatory Visit
Admission: RE | Admit: 2013-10-04 | Discharge: 2013-10-04 | Disposition: A | Discharge status: Home / Self Care | Payer: Medicare Other | Source: Ambulatory Visit | Attending: Radiation Oncology | Admitting: Radiation Oncology

## 2013-10-04 ENCOUNTER — Ambulatory Visit
Admission: RE | Admit: 2013-10-04 | Discharge: 2013-10-04 | Disposition: A | Discharge status: Home / Self Care | Payer: Medicare Other | Source: Ambulatory Visit

## 2013-10-04 VITALS — BP 148/106 | HR 76 | Temp 97.6°F | Wt 209.7 lb

## 2013-10-04 DIAGNOSIS — C8581 Other specified types of non-Hodgkin lymphoma, lymph nodes of head, face, and neck: Secondary | ICD-10-CM | POA: Insufficient documentation

## 2013-10-04 DIAGNOSIS — C8590 Non-Hodgkin lymphoma, unspecified, unspecified site: Secondary | ICD-10-CM

## 2013-10-04 HISTORY — DX: Malignant (primary) neoplasm, unspecified: C80.1

## 2013-10-04 NOTE — Progress Notes (Signed)
Radiation Oncology         [phone]) [phone] ________________________________  Initial outpatient Consultation - Date: 10/04/2013   Name: [name] MRN: [id number]   DOB: Feb 10, 1919  REFERRING PHYSICIAN: Annia Belt, MD  DIAGNOSIS: Stage IA DLBCL  HISTORY OF PRESENT ILLNESS::[name] is a 79 y.o. male  who noticed a left neck mass about a month ago. When it grew he presented to his primary care physician's office. He was then referred to a dermatologist and had an FNA performed which was nondiagnostic. He was then referred for an ultrasound which showed a 2.5 x 1.5 x 1.9 cm mass in the left neck. A CT scan was performed on September 17 which confirmed a 2 x 2.6 x 1.8 cm lymph node inferior to the parotid gland and lateral to the left sternocleidomastoid muscle. Normal sized lymph nodes were seen in the remainder of the neck. He was referred to and C4 an excisional biopsy which she had performed on 09/05/2013. This showed a diffuse large B-cell lymphoma. He denies any fevers weight loss or night sweats. He has no history of radiation to his neck. Really his only past medical history is malaria and status post tonsillectomy. Interestingly the CT of the neck performed in September also showed some tonsillar fullness but on examination he and he did not feel there was any abnormalities of his tonsil. He met with Dr. Phillip Heal for to note he discussed chemotherapy options for him. His LDH was normal at 206. He declined chemotherapy and was referred to see me today for consideration of radiation in the management of his disease. He is accompanied by his daughter and son. He reports no pain from this mass. He reports no swelling anywhere else.  PREVIOUS RADIATION THERAPY: No  PAST MEDICAL HISTORY:  has a past medical history of Malaria; Peripheral vascular disease; Acid reflux; Hypertension; Hypertension; Lymphadenopathy, cervical (08/29/2013); Hypothyroidism; and Cancer (09/05/13).    PAST  SURGICAL HISTORY: Past Surgical History  Procedure Laterality Date  . Tonsillectomy    . Mass biopsy Left 09/05/2013    Procedure: EXCISIONAL BIOPSY OF LEFT NECK MASS;  Surgeon: Jerrell Belfast, MD;  Location: [facility];  Service: ENT;  Laterality: Left;    FAMILY HISTORY: No family history on file.  SOCIAL HISTORY:  History  Substance Use Topics  . Smoking status: Never Smoker   . Smokeless tobacco: Former Systems developer    Quit date: 10/05/1979  . Alcohol Use: No    ALLERGIES: Codeine; Hydrocodone; and Percocet  MEDICATIONS:  Current Outpatient Prescriptions  Medication Sig Dispense Refill  . acetaminophen (TYLENOL) 325 MG tablet Take 325 mg by mouth every 6 (six) hours as needed for pain.       Marland Kitchen aspirin EC 81 MG tablet Take 1 tablet (81 mg total) by mouth daily.      . enalapril (VASOTEC) 20 MG tablet Take 20 mg by mouth daily.      . furosemide (LASIX) 20 MG tablet Take 20 mg by mouth daily. Take 1 tablet by mouth  every day      . HYDROcodone-acetaminophen (NORCO) 5-325 MG per tablet Take 1-2 tablets by mouth every 6 (six) hours as needed for pain.  20 tablet  0  . levothyroxine (SYNTHROID, LEVOTHROID) 88 MCG tablet Take 88 mcg by mouth daily before breakfast.      . mometasone (ELOCON) 0.1 % ointment Apply 1 application topically 2 (two) times daily.      Marland Kitchen omeprazole (PRILOSEC) 20  MG capsule Take 20 mg by mouth daily.      Marland Kitchen triamcinolone cream (KENALOG) 0.1 % APPLY A SMALL AMOUNT ONCE A DAY AFTER BATHING (PATIENT TO MIX EQUAL PARTS WITH CETAPHIL LOTION)  60 g  0  . [DISCONTINUED] famotidine (PEPCID) 20 MG tablet Take 20 mg by mouth 2 (two) times daily.       No current facility-administered medications for this encounter.    REVIEW OF SYSTEMS:  A 15 point review of systems is documented in the electronic medical record. This was obtained by the nursing staff. However, I reviewed this with the patient to discuss relevant findings and make appropriate changes.  Pertinent items are noted  in HPI.  PHYSICAL EXAM:  Filed Vitals:   10/04/13 1022  BP: 148/106  Pulse: 76  Temp: 97.6 F (36.4 C)  .209 lb 11.2 oz (95.119 kg). He has a mobile palpable mass in the level II left neck. There is an incision which is healed well over the top of this. There is no palpable submandibular or suprapubic or adenopathy bilaterally. No right neck adenopathy. No facial or parotid lymphadenopathy.  LABORATORY DATA:  Lab Results  Component Value Date   WBC 4.2 09/02/2013   HGB 14.2 09/02/2013   HCT 42.6 09/02/2013   MCV 91.2 09/02/2013   PLT 142* 09/02/2013   Lab Results  Component Value Date   NA 141 09/02/2013   K 4.2 09/02/2013   CL 103 09/02/2013   CO2 29 09/02/2013   Lab Results  Component Value Date   ALT 11 08/28/2013   AST 20 08/28/2013   ALKPHOS 73 08/28/2013   BILITOT 0.54 08/28/2013     RADIOGRAPHY: No results found.    IMPRESSION: 79 year old with a stage I diffuse large B-cell lymphoma  PLAN: We discussed the role of radiation in treating this disease in his neck. We discussed that it would take care of this disease but lymphoma is typically a more systemic disease with high rates of elsewhere failures. We discussed the treatment of this disease was likely not a curative treatment and that lymphoma would likely appear in other places in his body. He is unsure whether he wanted to proceed forward with radiation. He is asymptomatic from this mass and was not really interested in coming back and forth to many times for treatment. We discussed the possible side effects of treatment including hair loss skin redness fatigue decreased appetite and sore throat. We discussed the necessity of a mask and the process of CT simulation. He was curious about his prognosis and I informed him that radiation would not extend his life but it would however prevent this area in his left neck from causing his problems in the immediate future. He is going to go home and think about his options and give me a  call field like to proceed forward. If anything he would like to start after the holidays which may be reasonable. He has a followup scheduled Dr. Beryle Beams in January.  I spent 40 minutes  face to face with the patient and more than 50% of that time was spent in counseling and/or coordination of care.   ------------------------------------------------  Thea Silversmith, MD

## 2013-10-04 NOTE — Progress Notes (Signed)
Please see the Nurse Progress Note in the MD Initial Consult Encounter for this patient. 

## 2013-10-04 NOTE — Addendum Note (Signed)
Encounter addended by: Deirdre Evener, RN on: 10/04/2013  7:28 PM<BR>     Documentation filed: Charges VN

## 2013-10-14 ENCOUNTER — Encounter: Payer: Self-pay | Admitting: Oncology

## 2013-10-14 ENCOUNTER — Encounter: Payer: Self-pay | Admitting: Radiation Oncology

## 2013-10-14 ENCOUNTER — Telehealth: Payer: Self-pay | Admitting: *Deleted

## 2013-10-14 NOTE — Progress Notes (Signed)
Patient's son called and wanted to know what Radiation is going to cost. He doesn't know coverage his dad has--he said would have to have his sister call [employer] and find out. I advised him can't say what it will be until done(Radiation) bills at end. But he doesn't know if he has a deductible and if is is met or what the split for his coverage would be. He thought we would know??   He said dad doesn't want to do radiation until he knows. Advised to make sure they check with insurance for the details of his coverage and what will be paid.

## 2013-10-14 NOTE — Progress Notes (Signed)
Spoke to patient's son, Matai Sauvageau, and gave him information relating to his father's insurance.  Mr. Theodora Regis has [employer] and he does not have a deductible, and currently has an OOP max of $3,754.96 (coinsurance of 20%)  Explained that this OOP does not have to be paid prior to treatment starting.  Mr. Darene Lamer. Donegan expressed and understanding of this information and said he would relay it to his father and sister so they can make a decision.

## 2013-10-14 NOTE — Telephone Encounter (Signed)
Received call from pt's son stating pt wants to have idea of cost of radiation treatment before committing to treatments. Call accidentally cut off during transfer to [place]. Spoke w/A Halbrook and requested she call pt's son with information. Ms [name] verbalized understanding.

## 2013-10-17 ENCOUNTER — Other Ambulatory Visit: Payer: Self-pay | Admitting: Family Medicine

## 2013-10-18 ENCOUNTER — Ambulatory Visit
Admission: RE | Admit: 2013-10-18 | Discharge: 2013-10-18 | Disposition: A | Discharge status: Home / Self Care | Payer: Medicare Other | Source: Ambulatory Visit | Attending: Radiation Oncology | Admitting: Radiation Oncology

## 2013-10-18 DIAGNOSIS — C8589 Other specified types of non-Hodgkin lymphoma, extranodal and solid organ sites: Secondary | ICD-10-CM | POA: Insufficient documentation

## 2013-10-18 DIAGNOSIS — Z510 Encounter for antineoplastic radiation therapy: Secondary | ICD-10-CM | POA: Insufficient documentation

## 2013-10-18 DIAGNOSIS — C8590 Non-Hodgkin lymphoma, unspecified, unspecified site: Secondary | ICD-10-CM

## 2013-10-18 NOTE — Progress Notes (Signed)
[facility] Radiation Oncology Complex Simulation/Treatment Planning/IMRT note   SCHUYLER MCLAWHORN  [id number] 10/18/2013        [birth year]  Diagnosis: Non Hodgkin's lymphoma of the left neck  CONSENT VERIFIED:yes   SET UP: Patient is set-up supine   IMMOBILIZATION: The following immobilization is used:Aquaplast Mask   NARRATIVE:The patient was brought to the [facility].  Identity was confirmed.  All relevant records and images related to the planned course of therapy were reviewed.  Then, the patient was positioned in a stable reproducible clinical set-up for radiation therapy using an aquaplast mask.  IV contrast was administered and CT images were obtained.  Skin markings were placed.  The CT images were loaded into the planning software where the target and avoidance structures were contoured.  The patient's previous PET scan was fused with the planning CT to aid in target delineation. The radiation prescription was entered and confirmed.   TREATMENT PLANNING NOTE:  Treatment planning then occurred. I have requested : Intensity Modulated Radiotherapy (IMRT) is medically necessary for this case for the following reason:  Dose homogeneity and treatment of a head and neck site. We will be able to better spare his oral cavity, contralateral parotid and oropharynx. This will be extremely important to avoid as much toxicity in the very elderly gentleman as we can.

## 2013-10-31 ENCOUNTER — Ambulatory Visit
Admission: RE | Admit: 2013-10-31 | Discharge: 2013-10-31 | Disposition: A | Discharge status: Home / Self Care | Payer: Medicare Other | Source: Ambulatory Visit | Attending: Radiation Oncology | Admitting: Radiation Oncology

## 2013-10-31 ENCOUNTER — Encounter: Payer: Self-pay | Admitting: *Deleted

## 2013-10-31 NOTE — Progress Notes (Signed)
Met with patient and his sons prior to and after initial RT.  Introduced myself and explained my role as his navigator.  Initiating New Patient navigation with this encounter.  Gayleen Orem, RN, BSN, [facility] Head & Neck Oncology Navigator [phone]

## 2013-11-01 ENCOUNTER — Encounter: Payer: Self-pay | Admitting: *Deleted

## 2013-11-01 ENCOUNTER — Ambulatory Visit
Admission: RE | Admit: 2013-11-01 | Discharge: 2013-11-01 | Disposition: A | Discharge status: Home / Self Care | Payer: Medicare Other | Source: Ambulatory Visit | Attending: Radiation Oncology | Admitting: Radiation Oncology

## 2013-11-01 NOTE — Progress Notes (Signed)
Met with patient and his son and dtr during scheduled IMRT.  Provided additional education to son/dtr re: RT SE.  Continuing to navigate as L1 (new) patient.   Gayleen Orem, RN, BSN, [facility] Head & Neck Oncology Navigator [phone]

## 2013-11-04 ENCOUNTER — Ambulatory Visit
Admission: RE | Admit: 2013-11-04 | Discharge: 2013-11-04 | Disposition: A | Discharge status: Home / Self Care | Payer: Medicare Other | Source: Ambulatory Visit | Attending: Radiation Oncology | Admitting: Radiation Oncology

## 2013-11-04 ENCOUNTER — Encounter: Payer: Self-pay | Admitting: *Deleted

## 2013-11-04 NOTE — Progress Notes (Signed)
Met with patient during schedule RT to provide support and encouragement.  Spoke with son during tmt.  Continuing to navigate as L1 (new) patient.  Gayleen Orem, RN, BSN, [facility] Head & Neck Oncology Navigator [phone]

## 2013-11-05 ENCOUNTER — Ambulatory Visit
Admission: RE | Admit: 2013-11-05 | Discharge: 2013-11-05 | Disposition: A | Discharge status: Home / Self Care | Payer: Medicare Other | Source: Ambulatory Visit | Attending: Radiation Oncology | Admitting: Radiation Oncology

## 2013-11-05 ENCOUNTER — Encounter: Payer: Self-pay | Admitting: *Deleted

## 2013-11-05 VITALS — BP 200/86 | HR 69 | Temp 97.7°F | Ht 73.0 in | Wt 206.3 lb

## 2013-11-05 DIAGNOSIS — C8590 Non-Hodgkin lymphoma, unspecified, unspecified site: Secondary | ICD-10-CM

## 2013-11-05 MED ORDER — BIAFINE EX EMUL
CUTANEOUS | Status: DC | PRN
  Administered 2013-11-05: 12:00:00 via TOPICAL

## 2013-11-05 NOTE — Progress Notes (Signed)
Mr. Dragos has received 4 fractions to his left neck.  He denies any pain presently.  Skin intact on left neck with redness. Travel by wheelchair.  Accompanied by his to sons.   Given Radiation therapy and You booklet with appropriate pages marked regarding management of sore throat, difficulty swallowing, dental care,  skin care and pain.  He and sons stated understanding.  Given Biafine with instructions to apply BID, after treatment and at bedtime.

## 2013-11-05 NOTE — Progress Notes (Signed)
Weekly Management Note Current Dose: 12  Gy  Projected Dose: 30 Gy   Narrative:  The patient presents for routine under treatment assessment.  CBCT/MVCT images/Port film x-rays were reviewed.  The chart was checked. Doing well. No complaints. Less pain in left ear since starting treatment.   Physical Findings: Weight: 206 lb 4.8 oz (93.577 kg). Unchanged left neck node.  Impression:  The patient is tolerating radiation.  Plan:  Continue treatment as planned. Start biafene.

## 2013-11-05 NOTE — Progress Notes (Signed)
Met with patient and his sons to provide support and encouragement.  They indicated understanding that RT plan has been changed from 18 tmts to 10 tmts but with the same total dosage.  Continuing to navigate as L1 (new) patient.  Gayleen Orem, RN, BSN, [facility] Head & Neck Oncology Navigator [phone]

## 2013-11-06 ENCOUNTER — Ambulatory Visit
Admission: RE | Admit: 2013-11-06 | Discharge: 2013-11-06 | Disposition: A | Discharge status: Home / Self Care | Payer: Medicare Other | Source: Ambulatory Visit | Attending: Radiation Oncology | Admitting: Radiation Oncology

## 2013-11-07 ENCOUNTER — Ambulatory Visit
Admission: RE | Admit: 2013-11-07 | Discharge: 2013-11-07 | Disposition: A | Discharge status: Home / Self Care | Payer: Medicare Other | Source: Ambulatory Visit | Attending: Radiation Oncology | Admitting: Radiation Oncology

## 2013-11-08 ENCOUNTER — Ambulatory Visit
Admission: RE | Admit: 2013-11-08 | Discharge: 2013-11-08 | Disposition: A | Discharge status: Home / Self Care | Payer: Medicare Other | Source: Ambulatory Visit | Attending: Radiation Oncology | Admitting: Radiation Oncology

## 2013-11-11 ENCOUNTER — Ambulatory Visit
Admission: RE | Admit: 2013-11-11 | Discharge: 2013-11-11 | Disposition: A | Discharge status: Home / Self Care | Payer: Medicare Other | Source: Ambulatory Visit | Attending: Radiation Oncology | Admitting: Radiation Oncology

## 2013-11-12 ENCOUNTER — Encounter: Payer: Self-pay | Admitting: Radiation Oncology

## 2013-11-12 ENCOUNTER — Ambulatory Visit
Admission: RE | Admit: 2013-11-12 | Discharge: 2013-11-12 | Disposition: A | Discharge status: Home / Self Care | Payer: Medicare Other | Source: Ambulatory Visit | Attending: Radiation Oncology | Admitting: Radiation Oncology

## 2013-11-12 VITALS — BP 177/80 | HR 71 | Temp 97.7°F | Ht 73.0 in | Wt 209.8 lb

## 2013-11-12 DIAGNOSIS — C8590 Non-Hodgkin lymphoma, unspecified, unspecified site: Secondary | ICD-10-CM

## 2013-11-12 MED ORDER — MAGIC MOUTHWASH W/LIDOCAINE: 5.0000 mL | Freq: Three times a day (TID) | ORAL | Status: DC | PRN

## 2013-11-12 NOTE — Progress Notes (Signed)
[name] has received 9 fractions to is left neck.  He denies any pain nor difficulty swallowing.    His treatment filed demonstrates redness, but skin remains intact and the left neck node has decreased in size since last week.  Travel by wheelchair.  Accompanied by his family

## 2013-11-12 NOTE — Progress Notes (Signed)
Weekly Management Note Current Dose:27 Gy  Projected Dose:30 Gy   Narrative:  The patient presents for routine under treatment assessment.  CBCT/MVCT images/Port film x-rays were reviewed.  The chart was checked. Doing well. No mucocitis. Feels mass is shrinking. Accompanied by family.   Physical Findings:  Unchanged  Vitals:  Filed Vitals:   11/12/13 1103  BP: 177/80  Pulse: 71  Temp: 97.7 F (36.5 C)   Weight:  Wt Readings from Last 3 Encounters:  11/12/13 209 lb 12.8 oz (95.165 kg)  11/05/13 206 lb 4.8 oz (93.577 kg)  09/18/13 210 lb 3.2 oz (95.346 kg)   Lab Results  Component Value Date   WBC 4.2 09/02/2013   HGB 14.2 09/02/2013   HCT 42.6 09/02/2013   MCV 91.2 09/02/2013   PLT 142* 09/02/2013   Lab Results  Component Value Date   CREATININE 1.18 09/02/2013   BUN 15 09/02/2013   NA 141 09/02/2013   K 4.2 09/02/2013   CL 103 09/02/2013   CO2 29 09/02/2013    Impression:  The patient is tolerating radiation.  Plan:  Continue treatment as planned. Follow up in 1 month. I will put a script for magic mouth wash at his pharmacy at [facility] on [address] for him to pick up if needed. Has f/u scheduled with medical oncology.

## 2013-11-13 ENCOUNTER — Telehealth: Payer: Self-pay | Admitting: Family Medicine

## 2013-11-13 ENCOUNTER — Ambulatory Visit
Admission: RE | Admit: 2013-11-13 | Discharge: 2013-11-13 | Disposition: A | Discharge status: Home / Self Care | Payer: Medicare Other | Source: Ambulatory Visit | Attending: Radiation Oncology | Admitting: Radiation Oncology

## 2013-11-13 ENCOUNTER — Encounter: Payer: Self-pay | Admitting: *Deleted

## 2013-11-13 ENCOUNTER — Encounter: Payer: Self-pay | Admitting: Radiation Oncology

## 2013-11-13 NOTE — Progress Notes (Signed)
Met with patient and his family during final RT to provide support and care continuity.  Son noted that his dad has been having some sinus drainage over the past couple of days; PCP aware.  Patient did not express any other c/c.  Beginning to navigate as L3 (treatments completed) patient beginning with this encounter.  Gayleen Orem, RN, BSN, [facility] Head & Neck Oncology Navigator [phone]

## 2013-11-13 NOTE — Progress Notes (Addendum)
  Radiation Oncology         [phone] ________________________________  Name: [name] MRN: [id number]  Date: 11/13/2013  DOB: 1919-05-25  End of Treatment Note  Diagnosis:   Stage IA DLBCL     Indication for treatment:  Palliative       Radiation treatment dates:   10/31/2013-11/13/2013  Site/dose:   Left neck was treated to 30 Gy in 10 fractions at 3 Gy per fraction  Beams/energy:   IMRT using helical tomotherapy with 6 MV photons and daily MVCT for image guidance  Narrative: The patient tolerated radiation treatment relatively well.   He had minimal side effects and noticed a decrease in size of his mass during the course of treatment  Plan: The patient has completed radiation treatment. The patient will return to radiation oncology clinic for routine followup in one month. I advised them to call or return sooner if they have any questions or concerns related to their recovery or treatment.  ------------------------------------------------  Thea Silversmith, MD

## 2013-11-13 NOTE — Telephone Encounter (Signed)
Pt's dtr called and stated that the pt is having cough and congestion with no fever x 2 weeks. He has some tessalon perles left over from a previous cough and has taken one of those and wants to know if they need to do anything else and he only has a few left. I told pt to continue taking the Tessalon q 6 hours prn cough and mucinex dm but no decongestant and would let you know all this in case you wanted him on something different.

## 2013-11-14 ENCOUNTER — Ambulatory Visit: Payer: Medicare Other

## 2013-11-14 NOTE — Telephone Encounter (Signed)
OK with plan, if cough worsens or fever, NTBS.

## 2013-11-15 ENCOUNTER — Ambulatory Visit: Payer: Medicare Other

## 2013-11-15 NOTE — Telephone Encounter (Signed)
Tried to call no answer and no vm 

## 2013-11-18 ENCOUNTER — Ambulatory Visit: Payer: Medicare Other

## 2013-11-19 ENCOUNTER — Ambulatory Visit: Payer: Medicare Other

## 2013-11-20 ENCOUNTER — Ambulatory Visit: Payer: Medicare Other

## 2013-11-22 ENCOUNTER — Ambulatory Visit: Payer: Medicare Other

## 2013-11-25 ENCOUNTER — Ambulatory Visit: Payer: Medicare Other

## 2013-11-26 ENCOUNTER — Ambulatory Visit: Payer: Medicare Other

## 2013-11-26 ENCOUNTER — Telehealth: Payer: Self-pay | Admitting: *Deleted

## 2013-11-26 NOTE — Telephone Encounter (Signed)
Called patient as part of routine s/p treatment follow-up.  Patient stated he had been eating and drinking well though "my throat is a little sore", indicated "I'm feeling fine".  Will continue to navigate as L3 (treatments completed) patient.  Gayleen Orem, RN, BSN, [facility] Neck Oncology Navigator [phone]

## 2013-12-10 ENCOUNTER — Other Ambulatory Visit (HOSPITAL_COMMUNITY): Payer: Medicare Other

## 2013-12-16 ENCOUNTER — Encounter (HOSPITAL_COMMUNITY): Payer: Self-pay

## 2013-12-16 ENCOUNTER — Other Ambulatory Visit (HOSPITAL_BASED_OUTPATIENT_CLINIC_OR_DEPARTMENT_OTHER): Payer: Medicare Other

## 2013-12-16 ENCOUNTER — Ambulatory Visit (HOSPITAL_COMMUNITY)
Admission: RE | Admit: 2013-12-16 | Discharge: 2013-12-16 | Disposition: A | Discharge status: Home / Self Care | Payer: Medicare Other | Source: Ambulatory Visit | Attending: Oncology | Admitting: Oncology

## 2013-12-16 DIAGNOSIS — C8590 Non-Hodgkin lymphoma, unspecified, unspecified site: Secondary | ICD-10-CM

## 2013-12-16 DIAGNOSIS — M47814 Spondylosis without myelopathy or radiculopathy, thoracic region: Secondary | ICD-10-CM | POA: Insufficient documentation

## 2013-12-16 DIAGNOSIS — M4000 Postural kyphosis, site unspecified: Secondary | ICD-10-CM | POA: Insufficient documentation

## 2013-12-16 DIAGNOSIS — I2510 Atherosclerotic heart disease of native coronary artery without angina pectoris: Secondary | ICD-10-CM | POA: Insufficient documentation

## 2013-12-16 DIAGNOSIS — K449 Diaphragmatic hernia without obstruction or gangrene: Secondary | ICD-10-CM | POA: Insufficient documentation

## 2013-12-16 DIAGNOSIS — C8589 Other specified types of non-Hodgkin lymphoma, extranodal and solid organ sites: Secondary | ICD-10-CM | POA: Insufficient documentation

## 2013-12-16 DIAGNOSIS — C8581 Other specified types of non-Hodgkin lymphoma, lymph nodes of head, face, and neck: Secondary | ICD-10-CM

## 2013-12-16 LAB — COMPREHENSIVE METABOLIC PANEL (CC13)
ALT: 13 U/L (ref 0–55)
AST: 21 U/L (ref 5–34)
Albumin: 4.1 g/dL (ref 3.5–5.0)
Alkaline Phosphatase: 67 U/L (ref 40–150)
Anion Gap: 10 mEq/L (ref 3–11)
BUN: 19.4 mg/dL (ref 7.0–26.0)
CO2: 28 mEq/L (ref 22–29)
Calcium: 9.7 mg/dL (ref 8.4–10.4)
Chloride: 108 mEq/L (ref 98–109)
Creatinine: 1.4 mg/dL — ABNORMAL HIGH (ref 0.7–1.3)
Glucose: 95 mg/dl (ref 70–140)
Potassium: 4.5 mEq/L (ref 3.5–5.1)
Sodium: 146 mEq/L — ABNORMAL HIGH (ref 136–145)
Total Bilirubin: 0.76 mg/dL (ref 0.20–1.20)
Total Protein: 7 g/dL (ref 6.4–8.3)

## 2013-12-16 LAB — CBC WITH DIFFERENTIAL/PLATELET
BASO%: 0.8 % (ref 0.0–2.0)
Basophils Absolute: 0 10*3/uL (ref 0.0–0.1)
EOS%: 3.9 % (ref 0.0–7.0)
Eosinophils Absolute: 0.2 10*3/uL (ref 0.0–0.5)
HCT: 41.8 % (ref 38.4–49.9)
HGB: 14.1 g/dL (ref 13.0–17.1)
LYMPH%: 31.5 % (ref 14.0–49.0)
MCH: 31.3 pg (ref 27.2–33.4)
MCHC: 33.7 g/dL (ref 32.0–36.0)
MCV: 92.7 fL (ref 79.3–98.0)
MONO#: 0.4 10*3/uL (ref 0.1–0.9)
MONO%: 11.5 % (ref 0.0–14.0)
NEUT#: 2 10*3/uL (ref 1.5–6.5)
NEUT%: 52.3 % (ref 39.0–75.0)
Platelets: 122 10*3/uL — ABNORMAL LOW (ref 140–400)
RBC: 4.51 10*6/uL (ref 4.20–5.82)
RDW: 13.3 % (ref 11.0–14.6)
WBC: 3.8 10*3/uL — ABNORMAL LOW (ref 4.0–10.3)
lymph#: 1.2 10*3/uL (ref 0.9–3.3)
nRBC: 0 % (ref 0–0)

## 2013-12-16 LAB — LACTATE DEHYDROGENASE (CC13): LDH: 199 U/L (ref 125–245)

## 2013-12-17 ENCOUNTER — Telehealth: Payer: Self-pay | Admitting: Oncology

## 2013-12-17 ENCOUNTER — Ambulatory Visit (HOSPITAL_BASED_OUTPATIENT_CLINIC_OR_DEPARTMENT_OTHER): Payer: Medicare Other | Admitting: Oncology

## 2013-12-17 VITALS — BP 168/82 | HR 73 | Temp 97.8°F | Resp 18 | Ht 73.0 in | Wt 207.5 lb

## 2013-12-17 DIAGNOSIS — C8590 Non-Hodgkin lymphoma, unspecified, unspecified site: Secondary | ICD-10-CM

## 2013-12-17 DIAGNOSIS — C8581 Other specified types of non-Hodgkin lymphoma, lymph nodes of head, face, and neck: Secondary | ICD-10-CM

## 2013-12-17 NOTE — Progress Notes (Signed)
Hematology and Oncology Follow Up Visit  [name] [id number] 1919/02/04 80 y.o. 12/17/2013 3:22 PM   Principle Diagnosis: Encounter Diagnosis  Name Primary?  . Lymphoma Yes     Interim History:   Followup visit for this 80 year old man who presented in September 2014 with a painless left neck mass. Incisional biopsy done by Dr. Jerrell Belfast on 09/05/2013 and consistent with a high-grade B-cell non-Hodgkin's lymphoma. No other areas of adenopathy on scan. Normal serum LDH. Bone marrow biopsy declined. After a lengthy discussion about treatment options, he declined to take any chemotherapy. He was referred for palliative radiotherapy. He received 30 gray in 10 fractions between December 4th  and 11/13/2013. He tolerated treatments well. CT scans done in anticipation of today's visit on 12/06/2013 which I reviewed with him and his family showing complete response. No new disease in the neck, chest, or upper abdomen.   Medications: reviewed  Allergies:  Allergies  Allergen Reactions  . Codeine Nausea And Vomiting  . Hydrocodone Nausea And Vomiting  . Percocet [Oxycodone-Acetaminophen] Nausea And Vomiting    Review of Systems: Hematology:   no bleeding or bruising  ENT BJS:EGBT of taste while on radiation now improving  Breast ROS:  Respiratory ROS:  no cough or dyspnea  Cardiovascular ROS:  No chest pain or palpitations  Gastrointestinal ROS: No abdominal pain  Genito-Urinary ROS: not questioned  Musculoskeletal ROS: chronic arthritis pain  Neurological ROS:  no headache or change in vision he is blind in the left eye  Dermatological ROS:  rash in the inguinal region  Remaining ROS negative.  Physical Exam: Blood pressure 168/82, pulse 73, temperature 97.8 F (36.6 C), temperature source Oral, resp. rate 18, height '6\' 1"'  (1.854 m), weight 207 lb 8 oz (94.121 kg). Wt Readings from Last 3 Encounters:  12/17/13 207 lb 8 oz (94.121 kg)  11/12/13 209 lb 12.8 oz (95.165 kg)   11/05/13 206 lb 4.8 oz (93.577 kg)     General appearance:  well-nourished Caucasian man  HENNT:  Bilateral esotropion , Pharynx no erythema, exudate, mass, or ulcer. No thyromegaly or thyroid nodules Lymph nodes: No  supraclavicular, or axillary lymphadenopathy.complete resolution of previous left cervical adenopathy. Radiation changes on the skin.  Breasts: No abnormal skin changes, no dominant mass in either breast Lungs: Clear to auscultation, resonant to percussion throughout Heart: Regular rhythm, no murmur, no gallop, no rub, no click, no edema Abdomen: Soft, nontender,  Extremities: No edema, no calf tenderness Musculoskeletal: no joint deformities GU:  Vascular: Carotid pulses 2+, no bruits,  Neurologic: Alert, oriented, PERRLA,  , cranial nerves grossly normal, motor strength 5 over 5, reflexes 1+ symmetric, upper body coordination normal, gait not tested Skin: No  ecchymosis  Lab Results: CBC W/Diff    Component Value Date/Time   WBC 3.8* 12/16/2013 1030   WBC 4.2 09/02/2013 0843   RBC 4.51 12/16/2013 1030   RBC 4.67 09/02/2013 0843   HGB 14.1 12/16/2013 1030   HGB 14.2 09/02/2013 0843   HCT 41.8 12/16/2013 1030   HCT 42.6 09/02/2013 0843   PLT 122* 12/16/2013 1030   PLT 142* 09/02/2013 0843   MCV 92.7 12/16/2013 1030   MCV 91.2 09/02/2013 0843   MCH 31.3 12/16/2013 1030   MCH 30.4 09/02/2013 0843   MCHC 33.7 12/16/2013 1030   MCHC 33.3 09/02/2013 0843   RDW 13.3 12/16/2013 1030   RDW 13.7 09/02/2013 0843   LYMPHSABS 1.2 12/16/2013 1030   LYMPHSABS 1.1 02/13/2012 1340  MONOABS 0.4 12/16/2013 1030   MONOABS 0.3 02/13/2012 1340   EOSABS 0.2 12/16/2013 1030   EOSABS 0.0 02/13/2012 1340   BASOSABS 0.0 12/16/2013 1030   BASOSABS 0.0 02/13/2012 1340     Chemistry      Component Value Date/Time   NA 146* 12/16/2013 1030   NA 141 09/02/2013 0843   K 4.5 12/16/2013 1030   K 4.2 09/02/2013 0843   CL 103 09/02/2013 0843   CO2 28 12/16/2013 1030   CO2 29 09/02/2013 0843   BUN 19.4  12/16/2013 1030   BUN 15 09/02/2013 0843   CREATININE 1.4* 12/16/2013 1030   CREATININE 1.18 09/02/2013 0843      Component Value Date/Time   CALCIUM 9.7 12/16/2013 1030   CALCIUM 9.5 09/02/2013 0843   ALKPHOS 67 12/16/2013 1030   ALKPHOS 61 01/22/2012 2131   AST 21 12/16/2013 1030   AST 25 01/22/2012 2131   ALT 13 12/16/2013 1030   ALT 14 01/22/2012 2131   BILITOT 0.76 12/16/2013 1030   BILITOT 0.6 01/22/2012 2131       Radiological Studies: Ct Soft Tissue Neck Wo Contrast  12/16/2013   CLINICAL DATA:  Lymphoma.  EXAM: CT NECK WITHOUT CONTRAST  TECHNIQUE: Multidetector CT imaging of the neck was performed following the standard protocol without intravenous contrast.  COMPARISON:  None.  FINDINGS: The patient is status post biopsy and removal of a large left level II lymph node just inferior to the parotid overlying the sternocleidomastoid. Pathologic results revealed B-cell lymphoma. There is mild stranding and scarring in the region of the biopsy. In part, the stranding may be due to post XRT changes. No pathologic adenopathy is seen elsewhere. Atheromatous change both carotid bifurcations. No worrisome osseous lesions. Slight asymmetry of right palatine tonsil has not progressed, doubt mucosal lesion. Stable right level IV supraclavicular node, upper limits normal in size. Prominent suboccipital venous structures without definite level V adenopathy. No lung apex lesion. See accompanying CT chest dictation for chest findings.  IMPRESSION: Postsurgical changes as described. No new areas of pathologic adenopathy in this patient with biopsy proved left level II B cell lymphoma.   Electronically Signed   By: Rolla Flatten M.D.   On: 12/16/2013 14:56   Ct Chest Wo Contrast  12/16/2013   CLINICAL DATA:  Followup high-grade lymphoma.  EXAM: CT CHEST WITHOUT CONTRAST  TECHNIQUE: Multidetector CT imaging of the chest was performed following the standard protocol without IV contrast.  COMPARISON:  08/30/2013   FINDINGS: Small to moderate hiatal hernia. Dense coronary artery calcifications diffusely. Heart is normal size. Aorta is normal caliber.  Small scattered mediastinal lymph nodes, none pathologically enlarged. Right peritracheal node has a short axis diameter of 11 mm compared to 9-10 mm previously. This slight difference likely reflects differences in scan planes. No axillary or hilar adenopathy. No pleural effusions.  No confluent airspace opacities or pulmonary nodules. Chest wall soft tissues are unremarkable. Imaging into the upper abdomen shows no acute findings. No acute bony abnormality. Severe kyphosis and degenerative changes throughout the thoracic spine.  IMPRESSION: No acute findings in the chest.  No evidence of thoracic adenopathy.  Severe/diffuse coronary artery disease.  Small to moderate hiatal hernia, stable.   Electronically Signed   By: Rolm Baptise M.D.   On: 12/16/2013 13:38    Impression:  Clinical Stage IA high-grade, B-cell, non-Hodgkin's lymphoma treated with involved field radiation to only area of obvious disease. Again reviewed with him his son and his daughter  that this is a tumor that likes to recur if chemotherapy is not also part of the program. He is still ambivalent about chemotherapy. He seems a little bit more likely to accept it at this time but tells me he still has not made a final decision.  I will have him come back again in 4 months for reevaluation with CT scans and lab one week in advance.  I'm going to transition his care to Dr. Julieanne Manson.   CC: Patient Care Team: Susy Frizzle, MD as PCP - General (Family Medicine) Brooks Sailors, RN as Registered Nurse (Oncology)   Annia Belt, MD 1/20/20153:22 PM

## 2013-12-17 NOTE — Telephone Encounter (Signed)
Gave pt appt for lab and ML appt for [id number], former Dr. Darnell Level pt

## 2013-12-19 ENCOUNTER — Encounter: Payer: Self-pay | Admitting: *Deleted

## 2013-12-19 ENCOUNTER — Ambulatory Visit
Admission: RE | Admit: 2013-12-19 | Discharge: 2013-12-19 | Disposition: A | Discharge status: Home / Self Care | Payer: Medicare Other | Source: Ambulatory Visit | Attending: Radiation Oncology | Admitting: Radiation Oncology

## 2013-12-19 VITALS — BP 186/92 | HR 64 | Temp 97.4°F | Ht 73.0 in | Wt 205.0 lb

## 2013-12-19 DIAGNOSIS — C8590 Non-Hodgkin lymphoma, unspecified, unspecified site: Secondary | ICD-10-CM

## 2013-12-19 NOTE — Progress Notes (Signed)
Department of Radiation Oncology  Phone:  [phone] Fax:        [phone]   Name: [name] MRN: [id number]  DOB: 1919/10/10  Date: 12/19/2013  Follow Up Visit Note  Diagnosis: Stage I DLBCL lymphoma  Summary and Interval since last radiation: 1 month from 30 gy in 10 fractions to the left neck  Interval History: [name] presents today for routine followup.  He is feeling well. He had some diminished taste at the end of treatment but that has resolved. He also had a little bit of a sore throat but is not having none of that now. He feels like he is eating anything he wants and doing well. He met with Dr. Beryle Rodriguez and decided not to pursue chemotherapy. They have scans and followup scheduled in may. He is accompanied by his son today.  Allergies:  Allergies  Allergen Reactions  . Codeine Nausea And Vomiting  . Hydrocodone Nausea And Vomiting  . Percocet [Oxycodone-Acetaminophen] Nausea And Vomiting    Medications:  Current Outpatient Prescriptions  Medication Sig Dispense Refill  . acetaminophen (TYLENOL) 325 MG tablet Take 325 mg by mouth every 6 (six) hours as needed for pain.       Marland Kitchen aspirin EC 81 MG tablet Take 1 tablet (81 mg total) by mouth daily.      . benzonatate (TESSALON) 200 MG capsule Take 200 mg by mouth.      . enalapril (VASOTEC) 20 MG tablet Take 1 tablet by mouth   every day  90 tablet  3  . furosemide (LASIX) 20 MG tablet Take 1 tablet by mouth   every day  90 tablet  3  . levothyroxine (SYNTHROID, LEVOTHROID) 88 MCG tablet Take 1 tablet by mouth  every day  90 tablet  3  . mometasone (ELOCON) 0.1 % ointment Apply 1 application topically 2 (two) times daily.      Marland Kitchen omeprazole (PRILOSEC) 20 MG capsule Take 20 mg by mouth daily.      Marland Kitchen triamcinolone cream (KENALOG) 0.1 % APPLY A SMALL AMOUNT ONCE A DAY AFTER BATHING (PATIENT TO MIX EQUAL PARTS WITH CETAPHIL LOTION)  60 g  0  . [DISCONTINUED] famotidine (PEPCID) 20 MG tablet Take 20 mg by mouth 2 (two)  times daily.       No current facility-administered medications for this encounter.    Physical Exam:  Filed Vitals:   12/19/13 1342  BP: 186/92  Pulse: 64  Temp: 97.4 F (36.3 C)  Height: _0  (1.854 m)  Weight: 205 lb (92.987 kg)   his skin is well-healed. He has no mucositis. There is perhaps some small residual palpable lymphadenopathy in the left but certainly diminished from when he initially came in. No palpable right cervical adenopathy or right or left supraclavicular adenopathy.  IMPRESSION: [name] is a 80 y.o. male status post palliative radiation to the left neck for diffuse large B-cell lymphoma with resolving acute effects of treatment  PLAN:  I talked to [name]. He really is doing well. He has declined chemotherapy which given his age is probably for the past. We discussed that if on scans he has any areas of concern we may be able to spot well then with another 10 fractions of radiation and keep him from becoming symptomatic. He understands that without the use of systemic treatment he is unlikely to be cured of this disease. I will see him back on an as-needed basis. I encouraged  his son to contact me if they have any questions.    Thea Silversmith, MD

## 2013-12-19 NOTE — Progress Notes (Signed)
[name] here for assessment following radiation therapy to his left neck which completed on 11/13/13.  He denies any pain, difficultly swallowing, nor dysguesia,  and "eats anything he wants."  Skin on neck demonstrates decreased redness, and skin is intact. VSS   He is traveling by wheelchair today.

## 2013-12-19 NOTE — Progress Notes (Signed)
Met with patient and his son during scheduled follow-up appt with Dr. Pablo Ledger to provide support and care continuity.  Will continue to navigate as L3 (treatments completed) patient.  Gayleen Orem, RN, BSN, [facility] Neck Oncology Navigator [phone]

## 2014-03-10 ENCOUNTER — Other Ambulatory Visit: Payer: Self-pay | Admitting: Family Medicine

## 2014-03-25 ENCOUNTER — Encounter: Payer: Self-pay | Admitting: Family Medicine

## 2014-03-25 ENCOUNTER — Ambulatory Visit (INDEPENDENT_AMBULATORY_CARE_PROVIDER_SITE_OTHER): Payer: Medicare Other | Admitting: Family Medicine

## 2014-03-25 VITALS — BP 150/98 | HR 64 | Temp 97.2°F | Resp 16 | Ht 73.0 in | Wt 206.0 lb

## 2014-03-25 DIAGNOSIS — I8310 Varicose veins of unspecified lower extremity with inflammation: Secondary | ICD-10-CM

## 2014-03-25 DIAGNOSIS — I872 Venous insufficiency (chronic) (peripheral): Secondary | ICD-10-CM

## 2014-03-25 MED ORDER — FLUOCINONIDE-E 0.05 % EX CREA: 1.0000 "application " | TOPICAL_CREAM | Freq: Two times a day (BID) | CUTANEOUS | Status: DC

## 2014-03-25 NOTE — Progress Notes (Signed)
Subjective:    Patient ID: [name], male    DOB: [birth year], 80 y.o.   MRN: [id number]  HPI  Patient has a chronic itchy painful rash on both legs from his ankles to his knees. It is characterized by cracking and peeling of the skin, erythema, swelling, and diffuse petichiae.  He appears to be due to venous stasis dermatitis as the patient has severe varicose veins and pitting edema in both legs. Past Medical History  Diagnosis Date  . Malaria     while in the service  . Peripheral vascular disease     slow wound healing on legs  . Acid reflux   . Hypertension   . Hypertension     requires diuretic  . Lymphadenopathy, cervical 08/29/2013  . Hypothyroidism   . Cancer 09/05/13    left neck-non-hodgkins lymphoma   Current Outpatient Prescriptions on File Prior to Visit  Medication Sig Dispense Refill  . acetaminophen (TYLENOL) 325 MG tablet Take 325 mg by mouth every 6 (six) hours as needed for pain.       Marland Kitchen aspirin EC 81 MG tablet Take 1 tablet (81 mg total) by mouth daily.      . benzonatate (TESSALON) 200 MG capsule Take 200 mg by mouth.      . enalapril (VASOTEC) 20 MG tablet Take 1 tablet by mouth   every day  90 tablet  3  . furosemide (LASIX) 20 MG tablet Take 1 tablet by mouth   every day  90 tablet  3  . levothyroxine (SYNTHROID, LEVOTHROID) 88 MCG tablet Take 1 tablet by mouth  every day  90 tablet  3  . mometasone (ELOCON) 0.1 % ointment Apply 1 application topically 2 (two) times daily.      Marland Kitchen omeprazole (PRILOSEC) 20 MG capsule Take 20 mg by mouth daily.      Marland Kitchen triamcinolone cream (KENALOG) 0.1 % APPLY A SMALL AMOUNT ONCE A DAY AFTER BATHING (PATIENT TO MIX EQUAL PARTS WITH CETAPHIL LOTION)  60 g  0  . [DISCONTINUED] famotidine (PEPCID) 20 MG tablet Take 20 mg by mouth 2 (two) times daily.       No current facility-administered medications on file prior to visit.   Allergies  Allergen Reactions  . Codeine Nausea And Vomiting  . Hydrocodone Nausea And Vomiting    . Percocet [Oxycodone-Acetaminophen] Nausea And Vomiting   History   Social History  . Marital Status: Married    Spouse Name: N/A    Number of Children: 4  . Years of Education: N/A   Occupational History  . Not on file.   Social History Main Topics  . Smoking status: Never Smoker   . Smokeless tobacco: Former Systems developer    Quit date: 10/05/1979  . Alcohol Use: No  . Drug Use: No  . Sexual Activity: Not on file   Other Topics Concern  . Not on file   Social History Narrative  . No narrative on file     Review of Systems  All other systems reviewed and are negative.      Objective:   Physical Exam  Vitals reviewed. Cardiovascular: Normal rate, regular rhythm and normal heart sounds.   Pulmonary/Chest: Effort normal and breath sounds normal.  Musculoskeletal: He exhibits edema.  Skin: Rash noted. There is erythema.          Assessment & Plan:  1. Venous stasis dermatitis of both lower extremities I recommended the patient where compression stockings, elevate  his legs as much as possible try to prevent the swelling. I also prescribed patient Lidex cream to be used twice a day for one week for itchy irritation. - fluocinonide-emollient (LIDEX-E) 0.05 % cream; Apply 1 application topically 2 (two) times daily.  Dispense: 60 g; Refill: 0

## 2014-04-08 ENCOUNTER — Ambulatory Visit (HOSPITAL_COMMUNITY)
Admission: RE | Admit: 2014-04-08 | Discharge: 2014-04-08 | Disposition: A | Discharge status: Home / Self Care | Payer: Medicare Other | Source: Ambulatory Visit | Attending: Oncology | Admitting: Oncology

## 2014-04-08 ENCOUNTER — Other Ambulatory Visit (HOSPITAL_BASED_OUTPATIENT_CLINIC_OR_DEPARTMENT_OTHER): Payer: Medicare Other

## 2014-04-08 DIAGNOSIS — C8589 Other specified types of non-Hodgkin lymphoma, extranodal and solid organ sites: Secondary | ICD-10-CM | POA: Insufficient documentation

## 2014-04-08 DIAGNOSIS — C8581 Other specified types of non-Hodgkin lymphoma, lymph nodes of head, face, and neck: Secondary | ICD-10-CM

## 2014-04-08 DIAGNOSIS — Z923 Personal history of irradiation: Secondary | ICD-10-CM | POA: Insufficient documentation

## 2014-04-08 DIAGNOSIS — C8590 Non-Hodgkin lymphoma, unspecified, unspecified site: Secondary | ICD-10-CM

## 2014-04-08 LAB — CBC WITH DIFFERENTIAL/PLATELET
BASO%: 0.7 % (ref 0.0–2.0)
Basophils Absolute: 0 10*3/uL (ref 0.0–0.1)
EOS%: 2.5 % (ref 0.0–7.0)
Eosinophils Absolute: 0.1 10*3/uL (ref 0.0–0.5)
HCT: 40.7 % (ref 38.4–49.9)
HGB: 13.7 g/dL (ref 13.0–17.1)
LYMPH%: 29.2 % (ref 14.0–49.0)
MCH: 31.2 pg (ref 27.2–33.4)
MCHC: 33.6 g/dL (ref 32.0–36.0)
MCV: 92.8 fL (ref 79.3–98.0)
MONO#: 0.5 10*3/uL (ref 0.1–0.9)
MONO%: 11.6 % (ref 0.0–14.0)
NEUT#: 2.3 10*3/uL (ref 1.5–6.5)
NEUT%: 56 % (ref 39.0–75.0)
Platelets: 142 10*3/uL (ref 140–400)
RBC: 4.39 10*6/uL (ref 4.20–5.82)
RDW: 14 % (ref 11.0–14.6)
WBC: 4.1 10*3/uL (ref 4.0–10.3)
lymph#: 1.2 10*3/uL (ref 0.9–3.3)

## 2014-04-08 LAB — COMPREHENSIVE METABOLIC PANEL (CC13)
ALT: 13 U/L (ref 0–55)
AST: 19 U/L (ref 5–34)
Albumin: 3.9 g/dL (ref 3.5–5.0)
Alkaline Phosphatase: 64 U/L (ref 40–150)
Anion Gap: 12 mEq/L — ABNORMAL HIGH (ref 3–11)
BUN: 17.5 mg/dL (ref 7.0–26.0)
CO2: 26 mEq/L (ref 22–29)
Calcium: 9.6 mg/dL (ref 8.4–10.4)
Chloride: 107 mEq/L (ref 98–109)
Creatinine: 1.4 mg/dL — ABNORMAL HIGH (ref 0.7–1.3)
Glucose: 91 mg/dl (ref 70–140)
Potassium: 4.2 mEq/L (ref 3.5–5.1)
Sodium: 146 mEq/L — ABNORMAL HIGH (ref 136–145)
Total Bilirubin: 0.66 mg/dL (ref 0.20–1.20)
Total Protein: 6.7 g/dL (ref 6.4–8.3)

## 2014-04-08 LAB — LACTATE DEHYDROGENASE (CC13): LDH: 191 U/L (ref 125–245)

## 2014-04-08 LAB — SEDIMENTATION RATE: Sed Rate: 4 mm/hr (ref 0–16)

## 2014-04-10 ENCOUNTER — Encounter: Payer: Self-pay | Admitting: Family Medicine

## 2014-04-10 ENCOUNTER — Ambulatory Visit (INDEPENDENT_AMBULATORY_CARE_PROVIDER_SITE_OTHER): Payer: Medicare Other | Admitting: Family Medicine

## 2014-04-10 VITALS — BP 170/100 | HR 60 | Temp 97.0°F | Resp 16 | Ht 73.0 in | Wt 208.0 lb

## 2014-04-10 DIAGNOSIS — I8310 Varicose veins of unspecified lower extremity with inflammation: Secondary | ICD-10-CM

## 2014-04-10 DIAGNOSIS — Z23 Encounter for immunization: Secondary | ICD-10-CM

## 2014-04-10 DIAGNOSIS — I872 Venous insufficiency (chronic) (peripheral): Secondary | ICD-10-CM

## 2014-04-10 MED ORDER — TRIAMCINOLONE ACETONIDE 0.1 % EX CREA: 1.0000 "application " | TOPICAL_CREAM | Freq: Two times a day (BID) | CUTANEOUS | Status: DC

## 2014-04-10 NOTE — Addendum Note (Signed)
Addended by: Shary Decamp B on: 04/10/2014 12:22 PM   Modules accepted: Orders

## 2014-04-10 NOTE — Progress Notes (Signed)
Subjective:    Patient ID: [name], male    DOB: [birth year], 80 y.o.   MRN: [id number]  HPI  03/25/14 Patient has a chronic itchy painful rash on both legs from his ankles to his knees. It is characterized by cracking and peeling of the skin, erythema, swelling, and diffuse petichiae.  He appears to be due to venous stasis dermatitis as the patient has severe varicose veins and pitting edema in both legs.  At that time, my plan was: 1. Venous stasis dermatitis of both lower extremities I recommended the patient where compression stockings, elevate his legs as much as possible try to prevent the swelling. I also prescribed patient Lidex cream to be used twice a day for one week for itchy irritation. - fluocinonide-emollient (LIDEX-E) 0.05 % cream; Apply 1 application topically 2 (two) times daily.  Dispense: 60 g; Refill: 0  04/10/14 Patient's here today for a recheck of the rash on his legs. The swelling is much better ever since he started wearing compression stockings. He continues to use Lasix daily to help with swelling and edema. LIdex drastically reduce that irritation, the ichthyosis, and peeling skin. He is no other using it. Past Medical History  Diagnosis Date  . Malaria     while in the service  . Peripheral vascular disease     slow wound healing on legs  . Acid reflux   . Hypertension   . Hypertension     requires diuretic  . Lymphadenopathy, cervical 08/29/2013  . Hypothyroidism   . Cancer 09/05/13    left neck-non-hodgkins lymphoma   Current Outpatient Prescriptions on File Prior to Visit  Medication Sig Dispense Refill  . acetaminophen (TYLENOL) 325 MG tablet Take 325 mg by mouth every 6 (six) hours as needed for pain.       Marland Kitchen aspirin EC 81 MG tablet Take 1 tablet (81 mg total) by mouth daily.      . benzonatate (TESSALON) 200 MG capsule Take 200 mg by mouth.      . enalapril (VASOTEC) 20 MG tablet Take 1 tablet by mouth   every day  90 tablet  3  .  fluocinonide-emollient (LIDEX-E) 0.05 % cream Apply 1 application topically 2 (two) times daily.  60 g  0  . furosemide (LASIX) 20 MG tablet Take 1 tablet by mouth   every day  90 tablet  3  . levothyroxine (SYNTHROID, LEVOTHROID) 88 MCG tablet Take 1 tablet by mouth  every day  90 tablet  3  . mometasone (ELOCON) 0.1 % ointment Apply 1 application topically 2 (two) times daily.      Marland Kitchen omeprazole (PRILOSEC) 20 MG capsule Take 20 mg by mouth daily.      Marland Kitchen triamcinolone cream (KENALOG) 0.1 % APPLY A SMALL AMOUNT ONCE A DAY AFTER BATHING (PATIENT TO MIX EQUAL PARTS WITH CETAPHIL LOTION)  60 g  0  . [DISCONTINUED] famotidine (PEPCID) 20 MG tablet Take 20 mg by mouth 2 (two) times daily.       No current facility-administered medications on file prior to visit.   Allergies  Allergen Reactions  . Codeine Nausea And Vomiting  . Hydrocodone Nausea And Vomiting  . Percocet [Oxycodone-Acetaminophen] Nausea And Vomiting   History   Social History  . Marital Status: Married    Spouse Name: N/A    Number of Children: 4  . Years of Education: N/A   Occupational History  . Not on file.   Social History  Main Topics  . Smoking status: Never Smoker   . Smokeless tobacco: Former Systems developer    Quit date: 10/05/1979  . Alcohol Use: No  . Drug Use: No  . Sexual Activity: Not on file   Other Topics Concern  . Not on file   Social History Narrative  . No narrative on file     Review of Systems  All other systems reviewed and are negative.      Objective:   Physical Exam  Vitals reviewed. Cardiovascular: Normal rate, regular rhythm and normal heart sounds.   Pulmonary/Chest: Effort normal and breath sounds normal.  Musculoskeletal: He exhibits edema.  Skin: Rash noted. There is erythema.   there is still ichthyosis in both shins and +1 edema, but  this is much better than before. There is no further erythema. There is no warmth.        Assessment & Plan:  1. Venous stasis  dermatitis Continue Lasix on a daily basis to help with swelling. Continue compression stockings on a daily basis to help prevent swelling. 5 elevate the legs as much as possible throughout the day. Use triamcinolone cream on an as-needed basis for itching and rash.  DC lidex.   - triamcinolone cream (KENALOG) 0.1 %; Apply 1 application topically 2 (two) times daily.  Dispense: 454 g; Refill: 0

## 2014-04-11 ENCOUNTER — Telehealth: Payer: Self-pay | Admitting: Family Medicine

## 2014-04-11 MED ORDER — MECLIZINE HCL 25 MG PO TABS: 25.0000 mg | ORAL_TABLET | Freq: Three times a day (TID) | ORAL | Status: DC | PRN

## 2014-04-11 NOTE — Telephone Encounter (Signed)
Pharmacy is requesting refill on behalf of pt for Meclizine 25mg 

## 2014-04-11 NOTE — Telephone Encounter (Signed)
ok 

## 2014-04-11 NOTE — Telephone Encounter (Signed)
Med sent to pharm 

## 2014-04-11 NOTE — Telephone Encounter (Signed)
The last fill date was 07/26/12 - ? OK to Refill

## 2014-04-15 ENCOUNTER — Telehealth: Payer: Self-pay | Admitting: Oncology

## 2014-04-15 ENCOUNTER — Ambulatory Visit (HOSPITAL_BASED_OUTPATIENT_CLINIC_OR_DEPARTMENT_OTHER): Payer: Medicare Other | Admitting: Nurse Practitioner

## 2014-04-15 VITALS — BP 181/86 | HR 68 | Temp 97.9°F | Resp 18 | Ht 73.0 in | Wt 205.0 lb

## 2014-04-15 DIAGNOSIS — C8590 Non-Hodgkin lymphoma, unspecified, unspecified site: Secondary | ICD-10-CM

## 2014-04-15 DIAGNOSIS — C8581 Other specified types of non-Hodgkin lymphoma, lymph nodes of head, face, and neck: Secondary | ICD-10-CM

## 2014-04-15 NOTE — Progress Notes (Addendum)
  [place] OFFICE PROGRESS NOTE   Diagnosis:  Diffuse large B-cell lymphoma.  INTERVAL HISTORY:   [name] is a 80 year old man who initially presented in September 2014 with a painless left neck mass. Excisional biopsy 09/05/2013 showed a diffuse large B-cell lymphoma CD20 and CD79a positive. No other areas of adenopathy on scan. He declined a bone marrow biopsy. He declined chemotherapy. He completed palliative radiation 10/31/2013 through 11/13/2013.  Restaging CT neck and chest on 04/08/2014 showed no adenopathy.  He overall feels well. He has a good appetite. Weight is stable. No fevers or sweats. He denies pain. No shortness of breath or cough. No bowel or bladder problems.  Objective:  Vital signs in last 24 hours:  Blood pressure 181/86, pulse 68, temperature 97.9 F (36.6 C), temperature source Oral, resp. rate 18, height $RemoveBe'6\' 1"'DMFdZKAkT$  (1.854 m), weight 205 lb (92.987 kg).    HEENT: No thrush or ulcerations. Lymphatics: No palpable cervical, supraclavicular, axillary or inguinal lymph nodes. Resp: Lungs clear. Cardio: Regular cardiac rhythm. GI: Abdomen soft and nontender. No organomegaly. Vascular: Trace bilateral ankle edema.  Skin: Scabbed nodular lesion left earlobe. Crusted lesions at helix both ears.     Lab Results:  Lab Results  Component Value Date   WBC 4.1 04/08/2014   HGB 13.7 04/08/2014   HCT 40.7 04/08/2014   MCV 92.8 04/08/2014   PLT 142 04/08/2014   NEUTROABS 2.3 04/08/2014    Imaging:  No results found.  Medications: I have reviewed the patient's current medications.  Assessment/Plan: 1. Diffuse large B-cell lymphoma diagnosed September 2014 presenting with a painless left neck mass.   Excisional biopsy 09/05/2013 showed a diffuse large B-cell lymphoma CD20 and CD79a positive.   No other areas of adenopathy on staging scans.   Staging bone marrow biopsy declined.   Chemotherapy declined.   Completed radiation 10/31/2013  through 11/13/2013.   Restaging CT neck and chest on 04/08/2014 showed no adenopathy. 2. Skin lesions on both ears. He will followup with Dr. Ubaldo Glassing.   Disposition: [name] appears stable. He remains in remission from non-Hodgkin's lymphoma. He will return for a followup visit in 4 months. We discussed possible signs/symptoms of recurrent lymphoma including anorexia/weight loss, fevers, sweats, adenopathy, pain. He and his family understand to contact the office prior to his next visit with any problems, questions or concerns.  Patient seen with Dr. Benay Spice.    Owens Shark ANP/GNP-BC   04/15/2014  11:21 AM  This was a shared visit with Ned Card. [name] was examined. He remains in remission from NHL.   Jimmy Manson, MD

## 2014-04-15 NOTE — Telephone Encounter (Signed)
gv adn printed appt sched and avs for pt for SEpt °

## 2014-04-16 IMAGING — CR DG CHEST 2V
2 series · 2 of 2 positions shown · non-contrast
Comparison: 08/30/2013 the

CLINICAL DATA: Pre-admission for neck surgery

EXAM:
CHEST  2 VIEW

[w chest pa]
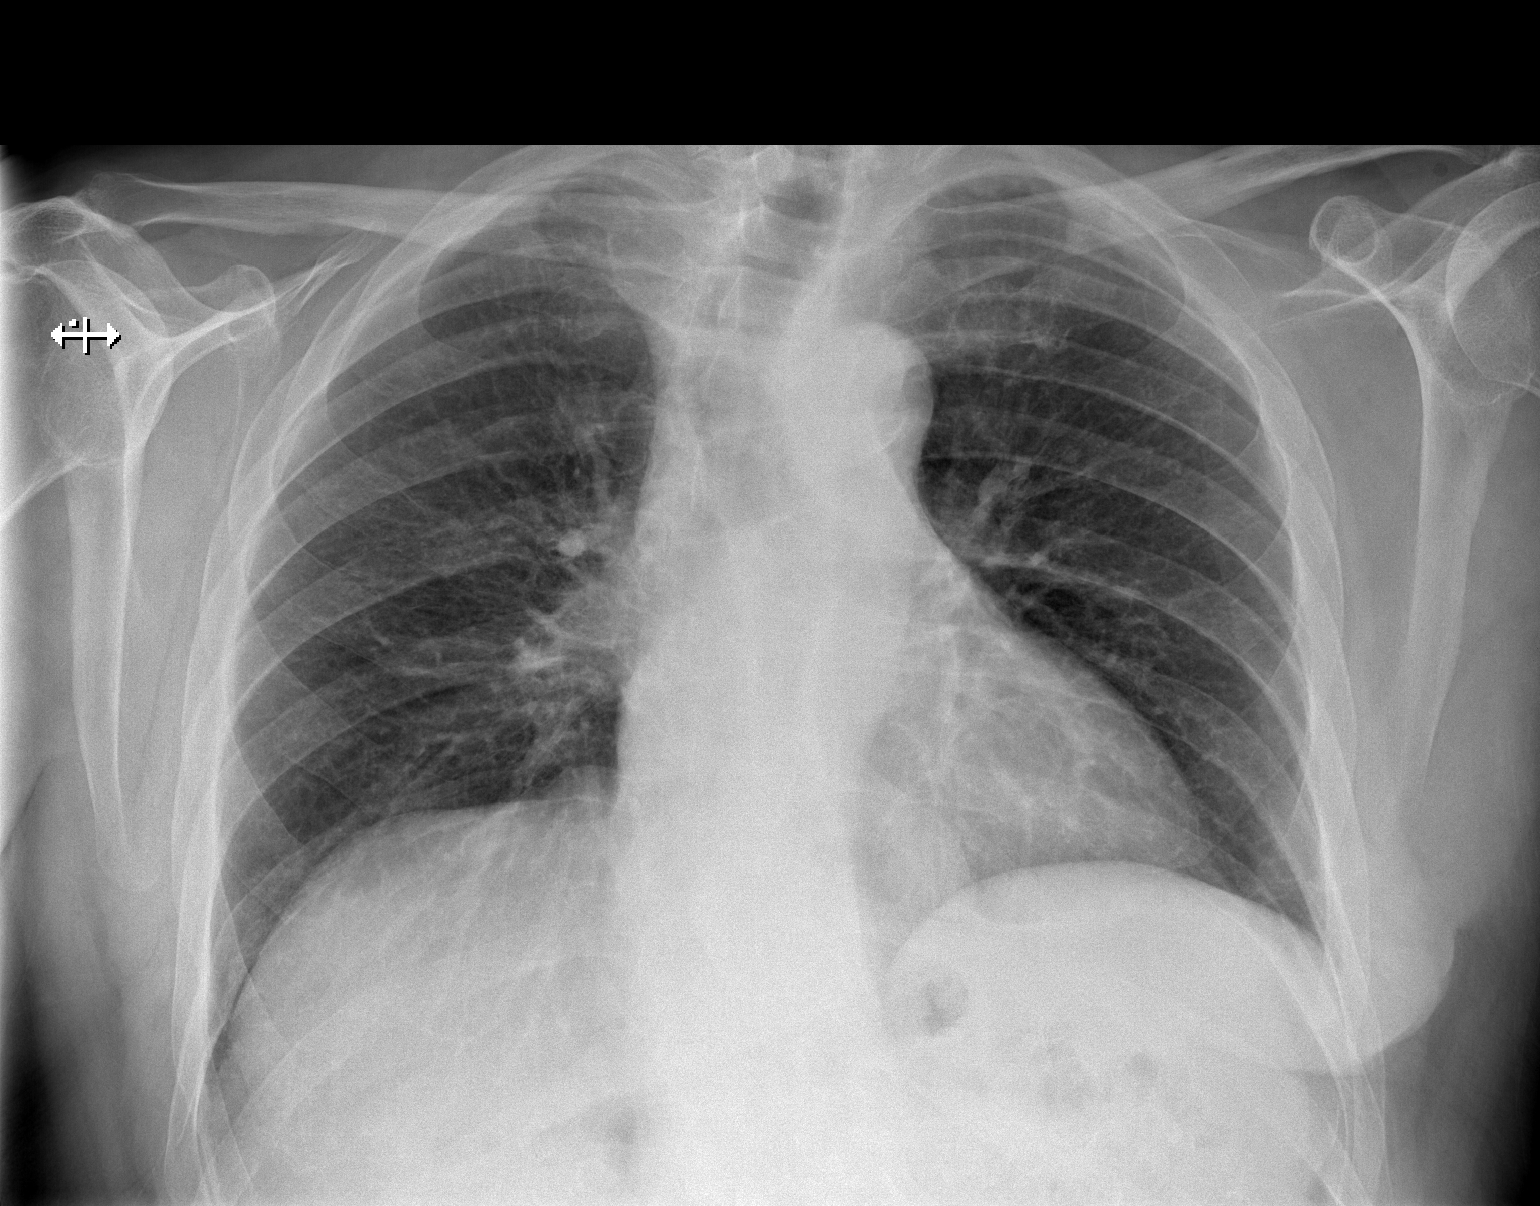

[w chest lat]
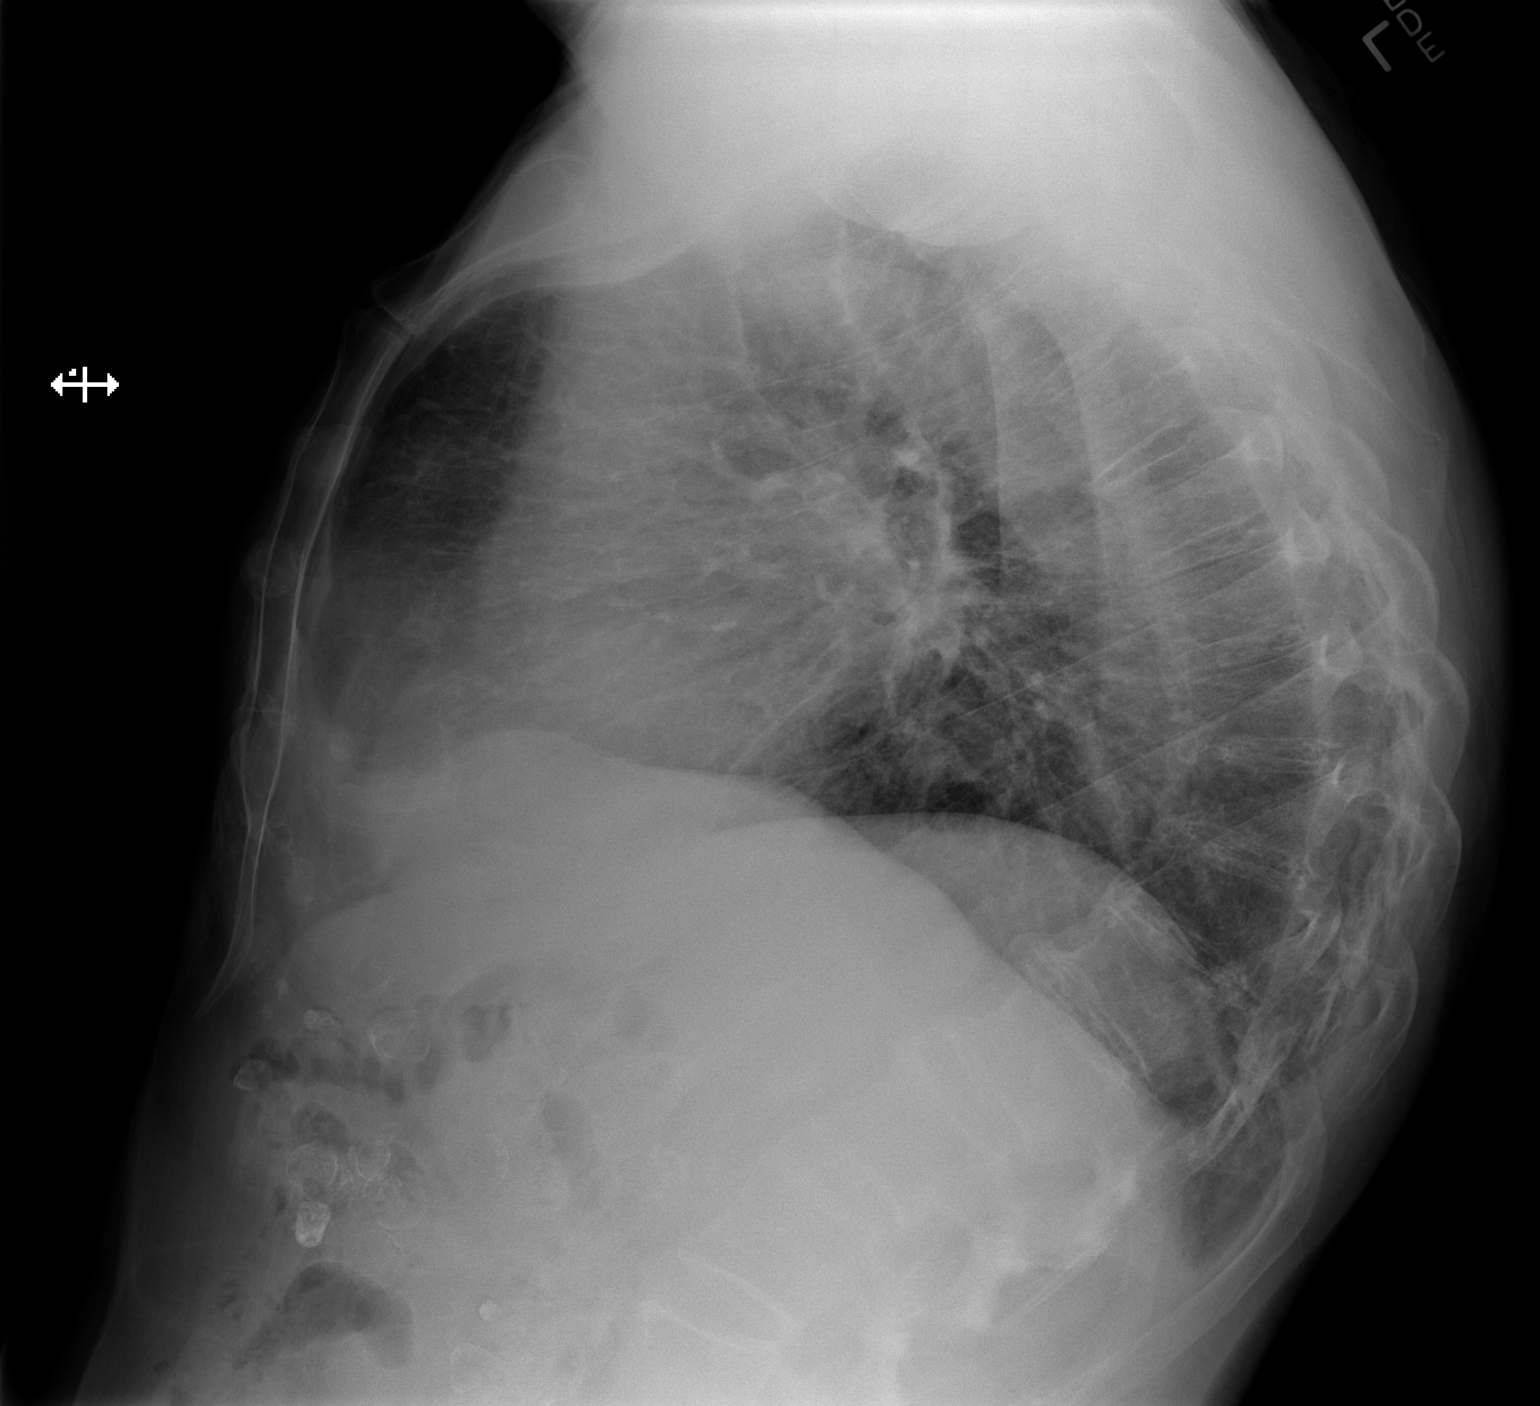

[2 of 2 positions shown; findings below may reference images not displayed]

FINDINGS: The heart size and mediastinal contours are within normal limits.
Mild elevation of the right hemidiaphragm again noted. No acute
infiltrate or pulmonary edema osteopenia and mild degenerative
changes thoracic spine.
IMPRESSION: No active cardiopulmonary disease. Osteopenia and mild degenerative
changes thoracic spine.

## 2014-04-18 ENCOUNTER — Other Ambulatory Visit: Payer: Self-pay | Admitting: Dermatology

## 2014-06-02 ENCOUNTER — Other Ambulatory Visit: Payer: Self-pay | Admitting: Family Medicine

## 2014-06-02 NOTE — Telephone Encounter (Signed)
Refill appropriate and filled per protocol. 

## 2014-08-18 ENCOUNTER — Ambulatory Visit (HOSPITAL_BASED_OUTPATIENT_CLINIC_OR_DEPARTMENT_OTHER): Payer: Medicare Other | Admitting: Oncology

## 2014-08-18 ENCOUNTER — Telehealth: Payer: Self-pay | Admitting: Oncology

## 2014-08-18 VITALS — BP 190/88 | HR 68 | Temp 97.9°F | Resp 20 | Ht 73.0 in | Wt 200.8 lb

## 2014-08-18 DIAGNOSIS — I10 Essential (primary) hypertension: Secondary | ICD-10-CM

## 2014-08-18 DIAGNOSIS — C8590 Non-Hodgkin lymphoma, unspecified, unspecified site: Secondary | ICD-10-CM

## 2014-08-18 DIAGNOSIS — C8581 Other specified types of non-Hodgkin lymphoma, lymph nodes of head, face, and neck: Secondary | ICD-10-CM

## 2014-08-18 DIAGNOSIS — L989 Disorder of the skin and subcutaneous tissue, unspecified: Secondary | ICD-10-CM

## 2014-08-18 NOTE — Telephone Encounter (Signed)
Pt confirmed MD visit per 09/21 POF gave pt AVS....KJ

## 2014-08-18 NOTE — Progress Notes (Signed)
  [name] OFFICE PROGRESS NOTE   Diagnosis: Non-Hodgkin's lymphoma  INTERVAL HISTORY:   He returns as scheduled. He feels well. Good appetite. He fell on 08/10/2014 and injured the right hand and right side of his chest wall. He continues to have soreness at the chest wall. No fever or night sweats. No palpable lymph nodes. He is followed by Dr. Ubaldo Glassing for dermatology care. She reports undergoing multiple skin biopsies.  Objective:  Vital signs in last 24 hours:  Blood pressure 176/102, pulse 70, temperature 97.9 F (36.6 C), temperature source Oral, resp. rate 20, height $RemoveBe'6\' 1"'ryGvfYpyd$  (1.854 m), weight 200 lb 12.8 oz (91.082 kg).    HEENT: Oropharynx without visible mass, neck without mass Lymphatics: No cervical, supra-clavicular, axillary, or inguinal nodes Resp: Lungs clear bilaterally Cardio: Regular rate and rhythm with premature beats GI: No hepatosplenomegaly, nontender Vascular: No leg edema  Skin: Small ecchymosis inferior to the right breast    Medications: I have reviewed the patient's current medications.  Assessment/Plan: 1. Diffuse large B-cell lymphoma diagnosed September 2014 presenting with a painless left neck mass.  Excisional biopsy 09/05/2013 showed a diffuse large B-cell lymphoma CD20 and CD79a positive.  No other areas of adenopathy on staging scans.  Staging bone marrow biopsy declined.  Chemotherapy declined.  Completed radiation 10/31/2013 through 11/13/2013.  Restaging CT neck and chest on 04/08/2014 showed no adenopathy. 2. Skin lesions on both ears. He is followed at [facility] dermatology 3. Hypertension   Disposition:  He remains in clinical remission from non-Hodgkin's lymphoma. [name] will return for an office visit in 4 months. He will obtain an influenza vaccine.  We will repeat his blood pressure prior to leaving the clinic today.  Betsy Coder, MD  08/18/2014  11:28 AM

## 2014-09-26 ENCOUNTER — Encounter: Payer: Medicare Other | Admitting: Family Medicine

## 2014-11-17 ENCOUNTER — Encounter: Payer: Medicare Other | Admitting: Family Medicine

## 2014-11-17 ENCOUNTER — Ambulatory Visit (INDEPENDENT_AMBULATORY_CARE_PROVIDER_SITE_OTHER): Payer: Medicare Other | Admitting: Family Medicine

## 2014-11-17 ENCOUNTER — Encounter: Payer: Self-pay | Admitting: Family Medicine

## 2014-11-17 VITALS — BP 120/80 | HR 80 | Temp 97.6°F | Resp 18 | Ht 70.0 in | Wt 201.0 lb

## 2014-11-17 DIAGNOSIS — Z0000 Encounter for general adult medical examination without abnormal findings: Secondary | ICD-10-CM

## 2014-11-17 LAB — COMPLETE METABOLIC PANEL WITH GFR
ALT: 13 U/L (ref 0–53)
AST: 23 U/L (ref 0–37)
Albumin: 4.6 g/dL (ref 3.5–5.2)
Alkaline Phosphatase: 62 U/L (ref 39–117)
BUN: 25 mg/dL — ABNORMAL HIGH (ref 6–23)
CO2: 28 mEq/L (ref 19–32)
Calcium: 9.9 mg/dL (ref 8.4–10.5)
Chloride: 109 mEq/L (ref 96–112)
Creat: 1.29 mg/dL (ref 0.50–1.35)
GFR, Est African American: 55 mL/min — ABNORMAL LOW
GFR, Est Non African American: 47 mL/min — ABNORMAL LOW
Glucose, Bld: 95 mg/dL (ref 70–99)
Potassium: 4.6 mEq/L (ref 3.5–5.3)
Sodium: 139 mEq/L (ref 135–145)
Total Bilirubin: 0.7 mg/dL (ref 0.2–1.2)
Total Protein: 7.2 g/dL (ref 6.0–8.3)

## 2014-11-17 LAB — CBC WITH DIFFERENTIAL/PLATELET
Basophils Absolute: 0 10*3/uL (ref 0.0–0.1)
Basophils Relative: 0 % (ref 0–1)
Eosinophils Absolute: 0.1 10*3/uL (ref 0.0–0.7)
Eosinophils Relative: 2 % (ref 0–5)
HCT: 43.8 % (ref 39.0–52.0)
Hemoglobin: 14.7 g/dL (ref 13.0–17.0)
Lymphocytes Relative: 33 % (ref 12–46)
Lymphs Abs: 1.6 10*3/uL (ref 0.7–4.0)
MCH: 31.5 pg (ref 26.0–34.0)
MCHC: 33.6 g/dL (ref 30.0–36.0)
MCV: 94 fL (ref 78.0–100.0)
MPV: 10 fL (ref 9.4–12.4)
Monocytes Absolute: 0.5 10*3/uL (ref 0.1–1.0)
Monocytes Relative: 10 % (ref 3–12)
Neutro Abs: 2.6 10*3/uL (ref 1.7–7.7)
Neutrophils Relative %: 55 % (ref 43–77)
Platelets: 163 10*3/uL (ref 150–400)
RBC: 4.66 MIL/uL (ref 4.22–5.81)
RDW: 13.4 % (ref 11.5–15.5)
WBC: 4.7 10*3/uL (ref 4.0–10.5)

## 2014-11-17 NOTE — Progress Notes (Signed)
Subjective:    Patient ID: [name], male    DOB: [birth year], 80 y.o.   MRN: [id number]  HPI  Patient is a very pleasant 80 year old white male with a history of lymphoma which is currently in remission. He is here today for complete physical exam. His pneumonia vaccine is up-to-date. His flu shot is up-to-date. Patient is possibly due for a tetanus vaccine but he refuses this today. Due to his age, it is not recommended to screen for prostate cancer or colon cancer. He is overdue for a CBC as well as a CMP. The remainder of his review of systems is negative. He denies any problems other than the stress of dealing with his wife who has end-stage dementia. Past Medical History  Diagnosis Date  . Malaria     while in the service  . Peripheral vascular disease     slow wound healing on legs  . Acid reflux   . Hypertension   . Hypertension     requires diuretic  . Lymphadenopathy, cervical 08/29/2013  . Hypothyroidism   . Cancer 09/05/13    left neck-non-hodgkins lymphoma   Past Surgical History  Procedure Laterality Date  . Tonsillectomy    . Mass biopsy Left 09/05/2013    Procedure: EXCISIONAL BIOPSY OF LEFT NECK MASS;  Surgeon: Jerrell Belfast, MD;  Location: [facility];  Service: ENT;  Laterality: Left;   Current Outpatient Prescriptions on File Prior to Visit  Medication Sig Dispense Refill  . acetaminophen (TYLENOL) 325 MG tablet Take 325 mg by mouth every 6 (six) hours as needed for pain.     Marland Kitchen aspirin EC 81 MG tablet Take 1 tablet (81 mg total) by mouth daily.    . enalapril (VASOTEC) 20 MG tablet Take 1 tablet by mouth  every day 90 tablet 3  . fluocinonide-emollient (LIDEX-E) 0.05 % cream Apply 1 application topically 2 (two) times daily. 60 g 0  . furosemide (LASIX) 20 MG tablet Take 1 tablet by mouth  every day 90 tablet 3  . levothyroxine (SYNTHROID, LEVOTHROID) 88 MCG tablet Take 1 tablet by mouth  every day 90 tablet 3  . meclizine (ANTIVERT) 25 MG tablet Take 1 tablet  (25 mg total) by mouth 3 (three) times daily as needed for dizziness. 30 tablet 0  . mometasone (ELOCON) 0.1 % ointment Apply 1 application topically 2 (two) times daily.    Marland Kitchen omeprazole (PRILOSEC) 20 MG capsule Take 20 mg by mouth daily.    Marland Kitchen triamcinolone cream (KENALOG) 0.1 % APPLY A SMALL AMOUNT ONCE A DAY AFTER BATHING (PATIENT TO MIX EQUAL PARTS WITH CETAPHIL LOTION) 60 g 0  . triamcinolone cream (KENALOG) 0.1 % Apply 1 application topically 2 (two) times daily. 454 g 0  . benzonatate (TESSALON) 200 MG capsule Take 200 mg by mouth.    . [DISCONTINUED] famotidine (PEPCID) 20 MG tablet Take 20 mg by mouth 2 (two) times daily.     No current facility-administered medications on file prior to visit.   Allergies  Allergen Reactions  . Codeine Nausea And Vomiting  . Hydrocodone Nausea And Vomiting  . Percocet [Oxycodone-Acetaminophen] Nausea And Vomiting   History   Social History  . Marital Status: Married    Spouse Name: N/A    Number of Children: 4  . Years of Education: N/A   Occupational History  . Not on file.   Social History Main Topics  . Smoking status: Never Smoker   . Smokeless tobacco: Former  User    Quit date: 10/05/1979  . Alcohol Use: No  . Drug Use: No  . Sexual Activity: Not on file   Other Topics Concern  . Not on file   Social History Narrative   No family history on file.   Review of Systems  All other systems reviewed and are negative.      Objective:   Physical Exam  Constitutional: He is oriented to person, place, and time. He appears well-developed and well-nourished. No distress.  HENT:  Head: Normocephalic and atraumatic.  Right Ear: External ear normal.  Left Ear: External ear normal.  Nose: Nose normal.  Mouth/Throat: Oropharynx is clear and moist. No oropharyngeal exudate.  Eyes: Conjunctivae and EOM are normal. Pupils are equal, round, and reactive to light. Right eye exhibits no discharge. Left eye exhibits no discharge. No  scleral icterus.  Neck: Normal range of motion. Neck supple. No JVD present. No tracheal deviation present. No thyromegaly present.  Cardiovascular: Normal rate, regular rhythm, normal heart sounds and intact distal pulses.  Exam reveals no gallop and no friction rub.   No murmur heard. Pulmonary/Chest: Effort normal and breath sounds normal. No stridor. No respiratory distress. He has no wheezes. He has no rales. He exhibits no tenderness.  Abdominal: Soft. Bowel sounds are normal. He exhibits no distension and no mass. There is no tenderness. There is no rebound and no guarding.  Musculoskeletal: Normal range of motion. He exhibits no edema or tenderness.  Lymphadenopathy:    He has no cervical adenopathy.  Neurological: He is alert and oriented to person, place, and time. He has normal reflexes. He displays normal reflexes. No cranial nerve deficit. He exhibits normal muscle tone. Coordination normal.  Skin: Skin is warm. No rash noted. He is not diaphoretic. No erythema. No pallor.  Psychiatric: He has a normal mood and affect. His behavior is normal. Judgment and thought content normal.  Vitals reviewed.         Assessment & Plan:  Routine general medical examination at a health care facility - Plan: COMPLETE METABOLIC PANEL WITH GFR, CBC with Differential  I do not appreciate any lymphadenopathy in the neck, supraclavicular area, axillary area, or inguinal.  His review of systems is otherwise negative. Cancer screening is up-to-date. Immunizations are up-to-date. I will check a CBC as well as a CMP. Also recommended that the patient use a walker with ambulation due to poor balance.

## 2014-12-18 ENCOUNTER — Other Ambulatory Visit: Payer: Self-pay

## 2014-12-18 ENCOUNTER — Ambulatory Visit (HOSPITAL_BASED_OUTPATIENT_CLINIC_OR_DEPARTMENT_OTHER): Payer: Medicare Other | Admitting: Nurse Practitioner

## 2014-12-18 ENCOUNTER — Telehealth: Payer: Self-pay | Admitting: Nurse Practitioner

## 2014-12-18 VITALS — BP 180/72 | HR 75 | Temp 97.7°F | Resp 18 | Ht 70.0 in | Wt 205.5 lb

## 2014-12-18 DIAGNOSIS — C8590 Non-Hodgkin lymphoma, unspecified, unspecified site: Secondary | ICD-10-CM

## 2014-12-18 DIAGNOSIS — C8331 Diffuse large B-cell lymphoma, lymph nodes of head, face, and neck: Secondary | ICD-10-CM

## 2014-12-18 DIAGNOSIS — I10 Essential (primary) hypertension: Secondary | ICD-10-CM

## 2014-12-18 NOTE — Progress Notes (Signed)
  [place] OFFICE PROGRESS NOTE   Diagnosis:  Non-Hodgkin's lymphoma  INTERVAL HISTORY:   [name] returns as scheduled. He overall feels well. No fevers or sweats. He has a good appetite. No weight loss. Energy level is stable. He ambulates with a cane. He has not noticed any enlarged lymph nodes. He did not get a lot of sleep last night. His wife has dementia and stays up much of the night.  Objective:  Vital signs in last 24 hours:  Blood pressure 180/72, pulse 75, temperature 97.7 F (36.5 C), temperature source Oral, resp. rate 18, height _0  (1.778 m), weight 205 lb 8 oz (93.214 kg), SpO2 99 %. repeat blood pressure 184/84    HEENT: No thrush or ulcers. Oropharynx without visible mass. No neck mass. Lymphatics: No palpable cervical, supraclavicular, axillary or inguinal lymph nodes. Resp: Lungs clear bilaterally. Cardio: Regular rate and rhythm. GI: Abdomen soft and nontender. No organomegaly. No mass. Vascular: No leg edema.    Lab Results:  Lab Results  Component Value Date   WBC 4.7 11/17/2014   HGB 14.7 11/17/2014   HCT 43.8 11/17/2014   MCV 94.0 11/17/2014   PLT 163 11/17/2014   NEUTROABS 2.6 11/17/2014    Imaging:  No results found.  Medications: I have reviewed the patient's current medications.  Assessment/Plan: 1. Diffuse large B-cell lymphoma diagnosed September 2014 presenting with a painless left neck mass.  Excisional biopsy 09/05/2013 showed a diffuse large B-cell lymphoma CD20 and CD79a positive.   No other areas of adenopathy on staging scans.   Staging bone marrow biopsy declined.   Chemotherapy declined.   Completed radiation 10/31/2013 through 11/13/2013.   Restaging CT neck and chest on 04/08/2014 showed no adenopathy. 2. Skin lesions on both ears. He is followed at [facility] dermatology. 3. Hypertension.   Disposition: [name] remains in clinical remission from non-Hodgkin's lymphoma. He will  return for a follow-up visit in 4 months.  His blood pressure was elevated at today's visit. He reports normal blood pressure readings when checked at home. He will contact Dr. Dennard Schaumann with persistent elevated blood pressure readings.    Ned Card ANP/GNP-BC   12/18/2014  9:56 AM

## 2014-12-18 NOTE — Telephone Encounter (Signed)
Gave asv & calendar for May.

## 2015-04-14 ENCOUNTER — Telehealth: Payer: Self-pay | Admitting: Oncology

## 2015-04-14 ENCOUNTER — Ambulatory Visit (HOSPITAL_BASED_OUTPATIENT_CLINIC_OR_DEPARTMENT_OTHER): Payer: Medicare Other | Admitting: Oncology

## 2015-04-14 VITALS — BP 188/86 | HR 72 | Temp 97.6°F | Resp 19 | Ht 70.0 in | Wt 205.1 lb

## 2015-04-14 DIAGNOSIS — C8331 Diffuse large B-cell lymphoma, lymph nodes of head, face, and neck: Secondary | ICD-10-CM | POA: Diagnosis not present

## 2015-04-14 DIAGNOSIS — C8590 Non-Hodgkin lymphoma, unspecified, unspecified site: Secondary | ICD-10-CM

## 2015-04-14 NOTE — Telephone Encounter (Signed)
Lft msg for pt confirming MD visit per 05/17 POF, mailed schedule to pt.... KJ

## 2015-04-14 NOTE — Progress Notes (Signed)
  [place] OFFICE PROGRESS NOTE   Diagnosis: Non-Hodgkin's,  INTERVAL HISTORY:   Mr. Bringhurst returns as scheduled. He feels well. Good appetite. No palpable lymph nodes. He is being followed by dermatology for a wound at the right lower leg. He reports chronic edema of the right leg. No fever or night sweats. Objective:  Vital signs in last 24 hours:  Blood pressure 188/86, pulse 72, temperature 97.6 F (36.4 C), temperature source Oral, resp. rate 19, height _0  (1.778 m), weight 205 lb 1.6 oz (93.033 kg), SpO2 100 %.    HEENT: Oropharynx without visible mass, neck without mass Lymphatics: No cervical, supra-clavicular, axillary, or inguinal nodes Resp: Lungs clear bilaterally Cardio: Regular rate and rhythm GI: No hepatosplenic the, nontender, no mass Vascular: No trace edema at the right lower leg with a gauze dressing at the pretibial area     Medications: I have reviewed the patient's current medications.  Assessment/Plan: 1. Diffuse large B-cell lymphoma diagnosed September 2014 presenting with a painless left neck mass.  Excisional biopsy 09/05/2013 showed a diffuse large B-cell lymphoma CD20 and CD79a positive.   No other areas of adenopathy on staging scans.   Staging bone marrow biopsy declined.   Chemotherapy declined.   Completed radiation 10/31/2013 through 11/13/2013.   Restaging CT neck and chest on 04/08/2014 showed no adenopathy. 2. Skin lesions on both ears. He is followed at [facility] dermatology. 3. Hypertension.   Disposition:  He remains in clinical remission from non-Hodgkin's lymphoma. He will contact us for palpable lymph nodes or new symptoms. Mr. Dresch will return for an office visit in 6 months.  Betsy Coder, MD  04/14/2015  10:57 AM

## 2015-04-28 ENCOUNTER — Ambulatory Visit (INDEPENDENT_AMBULATORY_CARE_PROVIDER_SITE_OTHER): Payer: Medicare Other | Admitting: Family Medicine

## 2015-04-28 ENCOUNTER — Encounter: Payer: Self-pay | Admitting: Family Medicine

## 2015-04-28 ENCOUNTER — Ambulatory Visit: Payer: Self-pay | Admitting: Family Medicine

## 2015-04-28 VITALS — BP 160/82 | HR 76 | Temp 97.9°F | Resp 18 | Ht 73.0 in | Wt 206.0 lb

## 2015-04-28 DIAGNOSIS — I8311 Varicose veins of right lower extremity with inflammation: Secondary | ICD-10-CM | POA: Diagnosis not present

## 2015-04-28 DIAGNOSIS — I83011 Varicose veins of right lower extremity with ulcer of thigh: Secondary | ICD-10-CM

## 2015-04-28 DIAGNOSIS — L259 Unspecified contact dermatitis, unspecified cause: Secondary | ICD-10-CM

## 2015-04-28 DIAGNOSIS — I83018 Varicose veins of right lower extremity with ulcer other part of lower leg: Secondary | ICD-10-CM | POA: Diagnosis not present

## 2015-04-28 DIAGNOSIS — I83012 Varicose veins of right lower extremity with ulcer of calf: Secondary | ICD-10-CM | POA: Diagnosis not present

## 2015-04-28 DIAGNOSIS — I83015 Varicose veins of right lower extremity with ulcer other part of foot: Secondary | ICD-10-CM | POA: Diagnosis not present

## 2015-04-28 DIAGNOSIS — I83014 Varicose veins of right lower extremity with ulcer of heel and midfoot: Secondary | ICD-10-CM | POA: Diagnosis not present

## 2015-04-28 DIAGNOSIS — I83013 Varicose veins of right lower extremity with ulcer of ankle: Secondary | ICD-10-CM | POA: Diagnosis not present

## 2015-04-28 DIAGNOSIS — IMO0001 Reserved for inherently not codable concepts without codable children: Secondary | ICD-10-CM

## 2015-04-28 DIAGNOSIS — I83019 Varicose veins of right lower extremity with ulcer of unspecified site: Secondary | ICD-10-CM | POA: Diagnosis not present

## 2015-04-28 MED ORDER — PREDNISONE 20 MG PO TABS: ORAL_TABLET | ORAL | Status: DC

## 2015-04-28 NOTE — Progress Notes (Signed)
Subjective:    Patient ID: [name], male    DOB: [birth year], 81 y.o.   MRN: [id number]  HPI Patient presents with a diffuse rash on his neck as well as on both arms distal to the elbow. It is characterized by erythematous papules coalescing into patches which is extremely itchy. It occurred after he was working in his yard mowing. He has tried Benadryl,  Sterilely cream, calamine, with little benefit. The rash itches terribly. His right leg is also extremely erythematous and swollen distal to the knee. He has petechiae on the proximal portion of his calf and anterior shin. He has +1 pitting edema in the leg with chronic venous stasis changes distal to the knee deep purple erythema and a 0.5 cm x 2 cm venous stasis ulcer on the anterior right shin. Past medical history is complicated by peripheral vascular disease. Past Medical History  Diagnosis Date  . Malaria     while in the service  . Peripheral vascular disease     slow wound healing on legs  . Acid reflux   . Hypertension   . Hypertension     requires diuretic  . Lymphadenopathy, cervical 08/29/2013  . Hypothyroidism   . Cancer 09/05/13    left neck-non-hodgkins lymphoma   Past Surgical History  Procedure Laterality Date  . Tonsillectomy    . Mass biopsy Left 09/05/2013    Procedure: EXCISIONAL BIOPSY OF LEFT NECK MASS;  Surgeon: Jerrell Belfast, MD;  Location: [facility];  Service: ENT;  Laterality: Left;   Current Outpatient Prescriptions on File Prior to Visit  Medication Sig Dispense Refill  . acetaminophen (TYLENOL) 325 MG tablet Take 325 mg by mouth every 6 (six) hours as needed for pain.     Marland Kitchen aspirin EC 81 MG tablet Take 1 tablet (81 mg total) by mouth daily.    . enalapril (VASOTEC) 20 MG tablet Take 1 tablet by mouth  every day 90 tablet 3  . fluocinonide-emollient (LIDEX-E) 0.05 % cream Apply 1 application topically 2 (two) times daily. 60 g 0  . furosemide (LASIX) 20 MG tablet Take 1 tablet by mouth  every day 90  tablet 3  . levothyroxine (SYNTHROID, LEVOTHROID) 88 MCG tablet Take 1 tablet by mouth  every day 90 tablet 3  . meclizine (ANTIVERT) 25 MG tablet Take 1 tablet (25 mg total) by mouth 3 (three) times daily as needed for dizziness. 30 tablet 0  . mometasone (ELOCON) 0.1 % ointment Apply 1 application topically 2 (two) times daily.    Marland Kitchen omeprazole (PRILOSEC) 20 MG capsule Take 20 mg by mouth as needed.     . [DISCONTINUED] famotidine (PEPCID) 20 MG tablet Take 20 mg by mouth 2 (two) times daily.     No current facility-administered medications on file prior to visit.   Allergies  Allergen Reactions  . Codeine Nausea And Vomiting  . Hydrocodone Nausea And Vomiting  . Percocet [Oxycodone-Acetaminophen] Nausea And Vomiting   History   Social History  . Marital Status: Married    Spouse Name: N/A  . Number of Children: 4  . Years of Education: N/A   Occupational History  . Not on file.   Social History Main Topics  . Smoking status: Never Smoker   . Smokeless tobacco: Former Systems developer    Quit date: 10/05/1979  . Alcohol Use: No  . Drug Use: No  . Sexual Activity: Not on file   Other Topics Concern  . Not on file  Social History Narrative      Review of Systems  All other systems reviewed and are negative.      Objective:   Physical Exam  Constitutional: He appears well-developed and well-nourished.  Cardiovascular: Normal rate, regular rhythm and normal heart sounds.   Pulmonary/Chest: Effort normal and breath sounds normal.  Musculoskeletal: He exhibits edema.  Skin: Rash noted. There is erythema.  Vitals reviewed. please see the description in the history of present illness        Assessment & Plan:  Contact dermatitis - Plan: predniSONE (DELTASONE) 20 MG tablet  Acute venous stasis dermatitis, right  Venous stasis ulcer, right  Patient appears to develop a contact dermatitis on his neck and his upper arms and forearms. I will treat this with a prednisone  taper pack. Patient has venous stasis dermatitis in his right leg and also venous stasis ulcer on his right shin. I placed the patient in an Unna boot area I applied an IT trainer loosely cause I am concerned about peripheral vascular disease. I would like to recheck the patient on Friday. Hopefully as we calm the swelling in his leg, the venous stasis dermatitis as well as the venous stasis ulcer will improve. We also need to keep in the back of our minds peripheral vascular disease as a contributing factor if the wound will not heal.

## 2015-05-01 ENCOUNTER — Encounter: Payer: Self-pay | Admitting: Family Medicine

## 2015-05-01 ENCOUNTER — Ambulatory Visit (INDEPENDENT_AMBULATORY_CARE_PROVIDER_SITE_OTHER): Payer: Medicare Other | Admitting: Family Medicine

## 2015-05-01 VITALS — BP 140/100 | HR 60 | Temp 97.9°F | Resp 18 | Wt 206.0 lb

## 2015-05-01 DIAGNOSIS — I8311 Varicose veins of right lower extremity with inflammation: Secondary | ICD-10-CM | POA: Diagnosis not present

## 2015-05-01 MED ORDER — VITAMIN B-12 1000 MCG PO TABS: 1000.0000 ug | ORAL_TABLET | Freq: Every day | ORAL | Status: DC

## 2015-05-01 NOTE — Progress Notes (Signed)
Subjective:    Patient ID: [name], male    DOB: 09-22-1919, 81 y.o.   MRN: [id number]  HPI 04/28/15 Patient presents with a diffuse rash on his neck as well as on both arms distal to the elbow. It is characterized by erythematous papules coalescing into patches which is extremely itchy. It occurred after he was working in his yard mowing. He has tried Benadryl,  Sterilely cream, calamine, with little benefit. The rash itches terribly. His right leg is also extremely erythematous and swollen distal to the knee. He has petechiae on the proximal portion of his calf and anterior shin. He has +1 pitting edema in the leg with chronic venous stasis changes distal to the knee deep purple erythema and a 0.5 cm x 2 cm venous stasis ulcer on the anterior right shin. Past medical history is complicated by peripheral vascular disease.  At that time, my plan was: Patient appears to develop a contact dermatitis on his neck and his upper arms and forearms. I will treat this with a prednisone taper pack. Patient has venous stasis dermatitis in his right leg and also venous stasis ulcer on his right shin. I placed the patient in an Unna boot area I applied an IT trainer loosely cause I am concerned about peripheral vascular disease. I would like to recheck the patient on Friday. Hopefully as we calm the swelling in his leg, the venous stasis dermatitis as well as the venous stasis ulcer will improve. We also need to keep in the back of our minds peripheral vascular disease as a contributing factor if the wound will not heal.  05/01/15 The rash on the patient's arms and his neck has completely subsided. The fiery red rash on his right leg is much better after removing the patient from his Unna boot. The wound on his anterior right shin is better today it is 1 cm x 1 cm and much shallower. The leg looks much better there is less irritation Past Medical History  Diagnosis Date  . Malaria     while in the service  .  Peripheral vascular disease     slow wound healing on legs  . Acid reflux   . Hypertension   . Hypertension     requires diuretic  . Lymphadenopathy, cervical 08/29/2013  . Hypothyroidism   . Cancer 09/05/13    left neck-non-hodgkins lymphoma   Past Surgical History  Procedure Laterality Date  . Tonsillectomy    . Mass biopsy Left 09/05/2013    Procedure: EXCISIONAL BIOPSY OF LEFT NECK MASS;  Surgeon: Jerrell Belfast, MD;  Location: [facility];  Service: ENT;  Laterality: Left;   Current Outpatient Prescriptions on File Prior to Visit  Medication Sig Dispense Refill  . acetaminophen (TYLENOL) 325 MG tablet Take 325 mg by mouth every 6 (six) hours as needed for pain.     Marland Kitchen aspirin EC 81 MG tablet Take 1 tablet (81 mg total) by mouth daily.    . enalapril (VASOTEC) 20 MG tablet Take 1 tablet by mouth  every day 90 tablet 3  . fluocinonide-emollient (LIDEX-E) 0.05 % cream Apply 1 application topically 2 (two) times daily. 60 g 0  . furosemide (LASIX) 20 MG tablet Take 1 tablet by mouth  every day 90 tablet 3  . levothyroxine (SYNTHROID, LEVOTHROID) 88 MCG tablet Take 1 tablet by mouth  every day 90 tablet 3  . meclizine (ANTIVERT) 25 MG tablet Take 1 tablet (25 mg total) by mouth  3 (three) times daily as needed for dizziness. 30 tablet 0  . mometasone (ELOCON) 0.1 % ointment Apply 1 application topically 2 (two) times daily.    Marland Kitchen omeprazole (PRILOSEC) 20 MG capsule Take 20 mg by mouth as needed.     . predniSONE (DELTASONE) 20 MG tablet 3 tabs poqday 1-2, 2 tabs poqday 3-4, 1 tab poqday 5-6 12 tablet 0  . [DISCONTINUED] famotidine (PEPCID) 20 MG tablet Take 20 mg by mouth 2 (two) times daily.     No current facility-administered medications on file prior to visit.   Allergies  Allergen Reactions  . Codeine Nausea And Vomiting  . Hydrocodone Nausea And Vomiting  . Percocet [Oxycodone-Acetaminophen] Nausea And Vomiting   History   Social History  . Marital Status: Married    Spouse  Name: N/A  . Number of Children: 4  . Years of Education: N/A   Occupational History  . Not on file.   Social History Main Topics  . Smoking status: Never Smoker   . Smokeless tobacco: Former Systems developer    Quit date: 10/05/1979  . Alcohol Use: No  . Drug Use: No  . Sexual Activity: Not on file   Other Topics Concern  . Not on file   Social History Narrative      Review of Systems  All other systems reviewed and are negative.      Objective:   Physical Exam  Constitutional: He appears well-developed and well-nourished.  Cardiovascular: Normal rate, regular rhythm and normal heart sounds.   Pulmonary/Chest: Effort normal and breath sounds normal.  Musculoskeletal: He exhibits edema.  Skin: Rash noted. There is erythema.  Vitals reviewed. please see the description in the history of present illness        Assessment & Plan:  Acute venous stasis dermatitis, right  Wound is almost healed. Contact dermatitis has resolved. Venous stasis dermatitis is better. I replaced the Unna boot today on the right leg. I recommended the patient leave this in place for 5 days then remove the [employer]. We will continue the Unna boots every 5 days until the wound on the right shin has healed. Once the wound on the right shin has healed, we will switch the patient back to his compression stockings

## 2015-05-25 ENCOUNTER — Other Ambulatory Visit: Payer: Self-pay

## 2015-05-28 ENCOUNTER — Encounter: Payer: Self-pay | Admitting: Physician Assistant

## 2015-05-28 ENCOUNTER — Ambulatory Visit (INDEPENDENT_AMBULATORY_CARE_PROVIDER_SITE_OTHER): Payer: Medicare Other | Admitting: Physician Assistant

## 2015-05-28 VITALS — BP 148/90 | HR 68 | Temp 97.6°F | Resp 20 | Wt 194.0 lb

## 2015-05-28 DIAGNOSIS — S91109A Unspecified open wound of unspecified toe(s) without damage to nail, initial encounter: Secondary | ICD-10-CM | POA: Diagnosis not present

## 2015-05-28 DIAGNOSIS — I10 Essential (primary) hypertension: Secondary | ICD-10-CM

## 2015-05-28 DIAGNOSIS — I739 Peripheral vascular disease, unspecified: Secondary | ICD-10-CM

## 2015-05-28 DIAGNOSIS — L239 Allergic contact dermatitis, unspecified cause: Secondary | ICD-10-CM

## 2015-05-28 DIAGNOSIS — I872 Venous insufficiency (chronic) (peripheral): Secondary | ICD-10-CM

## 2015-05-28 DIAGNOSIS — G4700 Insomnia, unspecified: Secondary | ICD-10-CM | POA: Diagnosis not present

## 2015-05-28 DIAGNOSIS — I8312 Varicose veins of left lower extremity with inflammation: Secondary | ICD-10-CM | POA: Diagnosis not present

## 2015-05-28 DIAGNOSIS — I8311 Varicose veins of right lower extremity with inflammation: Secondary | ICD-10-CM

## 2015-05-28 DIAGNOSIS — S91139A Puncture wound without foreign body of unspecified toe(s) without damage to nail, initial encounter: Secondary | ICD-10-CM

## 2015-05-28 DIAGNOSIS — W3400XA Accidental discharge from unspecified firearms or gun, initial encounter: Secondary | ICD-10-CM | POA: Insufficient documentation

## 2015-05-28 DIAGNOSIS — L200 Besnier's prurigo: Secondary | ICD-10-CM | POA: Diagnosis not present

## 2015-05-28 MED ORDER — TEMAZEPAM 15 MG PO CAPS: 15.0000 mg | ORAL_CAPSULE | Freq: Every evening | ORAL | Status: DC | PRN

## 2015-05-28 MED ORDER — SULFAMETHOXAZOLE-TRIMETHOPRIM 800-160 MG PO TABS: 1.0000 | ORAL_TABLET | Freq: Two times a day (BID) | ORAL | Status: DC

## 2015-05-28 MED ORDER — PREDNISONE 20 MG PO TABS: ORAL_TABLET | ORAL | Status: DC

## 2015-05-29 NOTE — Progress Notes (Signed)
Patient ID: [name] MRN: [id number], DOB: March 05, 1919, 81 y.o. Date of Encounter: @DATE @  Chief Complaint:  Chief Complaint  Patient presents with  . Edema    right foot swollen and painful, left great toe painful/blister    HPI: 81 y.o. year old male  presents with his daughter for office visit.   THE FOLLOWING ARE COPIED FROM DR. PICKARD'S RECENT OV NOTES FROM THE FOLLOWING DATES: HPI 04/28/15 Patient presents with a diffuse rash on his neck as well as on both arms distal to the elbow. It is characterized by erythematous papules coalescing into patches which is extremely itchy. It occurred after he was working in his yard mowing. He has tried Benadryl, Sterilely cream, calamine, with little benefit. The rash itches terribly. His right leg is also extremely erythematous and swollen distal to the knee. He has petechiae on the proximal portion of his calf and anterior shin. He has +1 pitting edema in the leg with chronic venous stasis changes distal to the knee deep purple erythema and a 0.5 cm x 2 cm venous stasis ulcer on the anterior right shin. Past medical history is complicated by peripheral vascular disease. At that time, my plan was: Patient appears to develop a contact dermatitis on his neck and his upper arms and forearms. I will treat this with a prednisone taper pack. Patient has venous stasis dermatitis in his right leg and also venous stasis ulcer on his right shin. I placed the patient in an Unna boot area I applied an IT trainer loosely cause I am concerned about peripheral vascular disease. I would like to recheck the patient on Friday. Hopefully as we calm the swelling in his leg, the venous stasis dermatitis as well as the venous stasis ulcer will improve. We also need to keep in the back of our minds peripheral vascular disease as a contributing factor if the wound will not heal.  05/01/15 The rash on the patient's arms and his neck has completely subsided. The fiery red  rash on his right leg is much better after removing the patient from his Unna boot. The wound on his anterior right shin is better today it is 1 cm x 1 cm and much shallower. The leg looks much better there is less irritation    TODAY--05/28/2015:   Patient himself is not able to provide me much information about what has been going on with his leg since the last visit. Daughter states that she does see the patient every day but says that she has not been seeing his leg and has not looked at the leg until just yesterday. She does state that the red areas on the right leg that are present right now look exactly like the same type of rash that he had at the visit 04/28/15.  During the visit she also says that he has a sore on the tip of his left first toe. She had applied a bandage to that and removed it for me to evaluate.  At the end of the visit she also comments that patient has not been getting any sleep. Says his wife has dementia and wakes him up throughout the night. Also says that  "his nerves are shot" because of this. She says that she knows he has to be worn out and exhausted. Says that he is not getting more than a couple hours of sleep. Is requesting some type of medication to help with this.  Past Medical History  Diagnosis  Date  . Malaria     while in the service  . Peripheral vascular disease     slow wound healing on legs  . Acid reflux   . Hypertension   . Hypertension     requires diuretic  . Lymphadenopathy, cervical 08/29/2013  . Hypothyroidism   . Cancer 09/05/13    left neck-non-hodgkins lymphoma     Home Meds: Outpatient Prescriptions Prior to Visit  Medication Sig Dispense Refill  . acetaminophen (TYLENOL) 325 MG tablet Take 325 mg by mouth every 6 (six) hours as needed for pain.     Marland Kitchen aspirin EC 81 MG tablet Take 1 tablet (81 mg total) by mouth daily.    . enalapril (VASOTEC) 20 MG tablet Take 1 tablet by mouth  every day 90 tablet 3  . fluocinonide-emollient  (LIDEX-E) 0.05 % cream Apply 1 application topically 2 (two) times daily. 60 g 0  . furosemide (LASIX) 20 MG tablet Take 1 tablet by mouth  every day 90 tablet 3  . levothyroxine (SYNTHROID, LEVOTHROID) 88 MCG tablet Take 1 tablet by mouth  every day 90 tablet 3  . meclizine (ANTIVERT) 25 MG tablet Take 1 tablet (25 mg total) by mouth 3 (three) times daily as needed for dizziness. 30 tablet 0  . mometasone (ELOCON) 0.1 % ointment Apply 1 application topically 2 (two) times daily.    Marland Kitchen omeprazole (PRILOSEC) 20 MG capsule Take 20 mg by mouth as needed.     . predniSONE (DELTASONE) 20 MG tablet 3 tabs poqday 1-2, 2 tabs poqday 3-4, 1 tab poqday 5-6 12 tablet 0  . vitamin B-12 (CYANOCOBALAMIN) 1000 MCG tablet Take 1 tablet (1,000 mcg total) by mouth daily. 30 tablet 5   No facility-administered medications prior to visit.    Allergies:  Allergies  Allergen Reactions  . Codeine Nausea And Vomiting  . Hydrocodone Nausea And Vomiting  . Percocet [Oxycodone-Acetaminophen] Nausea And Vomiting    History   Social History  . Marital Status: Married    Spouse Name: N/A  . Number of Children: 4  . Years of Education: N/A   Occupational History  . Not on file.   Social History Main Topics  . Smoking status: Never Smoker   . Smokeless tobacco: Former Systems developer    Quit date: 10/05/1979  . Alcohol Use: No  . Drug Use: No  . Sexual Activity: Not on file   Other Topics Concern  . Not on file   Social History Narrative    History reviewed. No pertinent family history.   Review of Systems:  See HPI for pertinent ROS. All other ROS negative.    Physical Exam: Blood pressure 148/90, pulse 68, temperature 97.6 F (36.4 C), temperature source Oral, resp. rate 20, weight 194 lb (87.998 kg)., Body mass index is 25.6 kg/(m^2). General: Elderly WM. Appears in no acute distress. Neck: Supple. No thyromegaly. No lymphadenopathy. Lungs: Clear bilaterally to auscultation without wheezes, rales, or  rhonchi. Breathing is unlabored. Heart: RRR with S1 S2. No murmurs, rubs, or gallops. Musculoskeletal:  Strength and tone normal for age. Extremities/Skin: Left 1st Toe: Tip of toe: 1 cm diameter ulcer. Appears to healing in.  Right Leg: Splotchy fiery red along lateral aspect of leg on top half of shin to just above knee.  Bottom 2/3 of anterior shin and dorsum of foot: fiery red.  In the center of the anterior shin, there is area that is purplish erythema. Thick scale on anterior shin.  Neuro:  Alert and oriented X 3. Moves all extremities spontaneously. Gait is normal. CNII-XII grossly in tact. Psych:  Responds to questions appropriately with a normal affect.     ASSESSMENT AND PLAN:  81 y.o. year old male with  1. Venous stasis dermatitis of both lower extremities  2. Wound, open, toe, initial encounter - sulfamethoxazole-trimethoprim (BACTRIM DS,SEPTRA DS) 800-160 MG per tablet; Take 1 tablet by mouth 2 (two) times daily.  Dispense: 20 tablet; Refill: 0 - AMB referral to wound care center  3. Allergic dermatitis - predniSONE (DELTASONE) 20 MG tablet; Take 3 daily for 2 days, then 2 daily for 2 days, then 1 daily for 2 days.  Dispense: 12 tablet; Refill: 0  4. Peripheral vascular disease  5. Essential hypertension  6. Insomnia - temazepam (RESTORIL) 15 MG capsule; Take 1 capsule (15 mg total) by mouth at bedtime as needed for sleep.  Dispense: 30 capsule; Refill: 0  The fiery red rash seems to be the same that he had 04/28/15 which responded to prednisone. Therefore will treat that with prednisone. For the left toe ulcer, will treat with Bactrim and referral to wound center. Discussed with patient and daughter that he absolutely must avoid any trauma to this toe and to keep it bandaged to protect from any trauma. Otherwise can remove bandage to get some air to the site if he is going to be sitting still. Told daughter to have him take the Restoril at night and then the next  morning her ask him how his sleep was so that we can monitor his response to that. He is going to schedule follow-up visit with Dr. Dennard Schaumann.  I reviewed his chart to see if he has actually had any vascular studies. Was unable to locate any type of arterial evaluation. In his paper chart I did find one note with CVT S Todd Early 01/23/2006. At that time his note documented that he had 2+ bilateral femoral popliteal and dorsalis pedis pulses. Stated that he had skin changes with hemosiderin deposit bilaterally. He had superficial varicosities from his knees distally. His note also stated that he reviewed studies performed in their office 12/23/05. This revealed no evidence of deep venous or superficial valvular incompetence on the right. He did have thrombosed superficial varicosities throughout the anterior medial aspect of this calf descending down to the ankle. At that time he recommended continued elevation and compression when possible for symptomatic relief of superficial thrombophlebitis. Also reviewed that he has had multiple visits to the wound center back in 2007, 2008 also multiple visits with Dr. Dennard Schaumann for this same problem in 2012.   Marin Olp [place], [place], [facility] 05/29/2015 7:27 AM

## 2015-06-02 ENCOUNTER — Ambulatory Visit (INDEPENDENT_AMBULATORY_CARE_PROVIDER_SITE_OTHER): Payer: Medicare Other | Admitting: Family Medicine

## 2015-06-02 ENCOUNTER — Encounter: Payer: Self-pay | Admitting: Family Medicine

## 2015-06-02 VITALS — BP 130/88 | HR 78 | Temp 97.7°F | Resp 18 | Ht 73.0 in | Wt 204.0 lb

## 2015-06-02 DIAGNOSIS — I739 Peripheral vascular disease, unspecified: Secondary | ICD-10-CM

## 2015-06-02 DIAGNOSIS — L97521 Non-pressure chronic ulcer of other part of left foot limited to breakdown of skin: Secondary | ICD-10-CM

## 2015-06-02 DIAGNOSIS — L539 Erythematous condition, unspecified: Secondary | ICD-10-CM | POA: Diagnosis not present

## 2015-06-02 DIAGNOSIS — L97529 Non-pressure chronic ulcer of other part of left foot with unspecified severity: Secondary | ICD-10-CM

## 2015-06-02 NOTE — Progress Notes (Signed)
Subjective:    Patient ID: [name], male    DOB: 10/21/1919, 81 y.o.   MRN: [id number]  HPI 04/28/15 Patient presents with a diffuse rash on his neck as well as on both arms distal to the elbow. It is characterized by erythematous papules coalescing into patches which is extremely itchy. It occurred after he was working in his yard mowing. He has tried Benadryl,  Sterilely cream, calamine, with little benefit. The rash itches terribly. His right leg is also extremely erythematous and swollen distal to the knee. He has petechiae on the proximal portion of his calf and anterior shin. He has +1 pitting edema in the leg with chronic venous stasis changes distal to the knee deep purple erythema and a 0.5 cm x 2 cm venous stasis ulcer on the anterior right shin. Past medical history is complicated by peripheral vascular disease.  At that time, my plan was: Patient appears to develop a contact dermatitis on his neck and his upper arms and forearms. I will treat this with a prednisone taper pack. Patient has venous stasis dermatitis in his right leg and also venous stasis ulcer on his right shin. I placed the patient in an Unna boot area I applied an IT trainer loosely cause I am concerned about peripheral vascular disease. I would like to recheck the patient on Friday. Hopefully as we calm the swelling in his leg, the venous stasis dermatitis as well as the venous stasis ulcer will improve. We also need to keep in the back of our minds peripheral vascular disease as a contributing factor if the wound will not heal.  05/01/15 The rash on the patient's arms and his neck has completely subsided. The fiery red rash on his right leg is much better after removing the patient from his Unna boot. The wound on his anterior right shin is better today it is 1 cm x 1 cm and much shallower. The leg looks much better there is less irritation.  AT that time, my plan was: Wound is almost healed. Contact dermatitis has  resolved. Venous stasis dermatitis is better. I replaced the Unna boot today on the right leg. I recommended the patient leave this in place for 5 days then remove the [employer]. We will continue the Unna boots every 5 days until the wound on the right shin has healed. Once the wound on the right shin has healed, we will switch the patient back to his compression stockings.  06/02/15 He returns today with an ulcer on the tip of his left great toe. It is 7 mm in size. The toe itself is erythematous. He was treated with anabolic last week by my physician assistant. Erythema has not improved. There are no palpable dorsalis pedis pulses or posterior tibialis pulses in the left foot. Capillary refill in the foot is sluggish. He has a history of peripheral vascular disease Past Medical History  Diagnosis Date  . Malaria     while in the service  . Peripheral vascular disease     slow wound healing on legs  . Acid reflux   . Hypertension   . Hypertension     requires diuretic  . Lymphadenopathy, cervical 08/29/2013  . Hypothyroidism   . Cancer 09/05/13    left neck-non-hodgkins lymphoma   Past Surgical History  Procedure Laterality Date  . Tonsillectomy    . Mass biopsy Left 09/05/2013    Procedure: EXCISIONAL BIOPSY OF LEFT NECK MASS;  Surgeon: Jerrell Belfast,  MD;  Location: [facility];  Service: ENT;  Laterality: Left;   Current Outpatient Prescriptions on File Prior to Visit  Medication Sig Dispense Refill  . acetaminophen (TYLENOL) 325 MG tablet Take 325 mg by mouth every 6 (six) hours as needed for pain.     Marland Kitchen aspirin EC 81 MG tablet Take 1 tablet (81 mg total) by mouth daily.    . enalapril (VASOTEC) 20 MG tablet Take 1 tablet by mouth  every day 90 tablet 3  . fluocinonide-emollient (LIDEX-E) 0.05 % cream Apply 1 application topically 2 (two) times daily. 60 g 0  . furosemide (LASIX) 20 MG tablet Take 1 tablet by mouth  every day 90 tablet 3  . levothyroxine (SYNTHROID, LEVOTHROID) 88 MCG  tablet Take 1 tablet by mouth  every day 90 tablet 3  . meclizine (ANTIVERT) 25 MG tablet Take 1 tablet (25 mg total) by mouth 3 (three) times daily as needed for dizziness. 30 tablet 0  . mometasone (ELOCON) 0.1 % ointment Apply 1 application topically 2 (two) times daily.    Marland Kitchen omeprazole (PRILOSEC) 20 MG capsule Take 20 mg by mouth as needed.     . predniSONE (DELTASONE) 20 MG tablet 3 tabs poqday 1-2, 2 tabs poqday 3-4, 1 tab poqday 5-6 12 tablet 0  . predniSONE (DELTASONE) 20 MG tablet Take 3 daily for 2 days, then 2 daily for 2 days, then 1 daily for 2 days. 12 tablet 0  . sulfamethoxazole-trimethoprim (BACTRIM DS,SEPTRA DS) 800-160 MG per tablet Take 1 tablet by mouth 2 (two) times daily. 20 tablet 0  . temazepam (RESTORIL) 15 MG capsule Take 1 capsule (15 mg total) by mouth at bedtime as needed for sleep. 30 capsule 0  . vitamin B-12 (CYANOCOBALAMIN) 1000 MCG tablet Take 1 tablet (1,000 mcg total) by mouth daily. 30 tablet 5  . [DISCONTINUED] famotidine (PEPCID) 20 MG tablet Take 20 mg by mouth 2 (two) times daily.     No current facility-administered medications on file prior to visit.   Allergies  Allergen Reactions  . Codeine Nausea And Vomiting  . Hydrocodone Nausea And Vomiting  . Percocet [Oxycodone-Acetaminophen] Nausea And Vomiting   History   Social History  . Marital Status: Married    Spouse Name: N/A  . Number of Children: 4  . Years of Education: N/A   Occupational History  . Not on file.   Social History Main Topics  . Smoking status: Never Smoker   . Smokeless tobacco: Former Systems developer    Quit date: 10/05/1979  . Alcohol Use: No  . Drug Use: No  . Sexual Activity: Not on file   Other Topics Concern  . Not on file   Social History Narrative      Review of Systems  All other systems reviewed and are negative.      Objective:   Physical Exam  Constitutional: He appears well-developed and well-nourished.  Cardiovascular: Normal rate, regular rhythm  and normal heart sounds.   Pulmonary/Chest: Effort normal and breath sounds normal.  Musculoskeletal: He exhibits edema.  Skin: Rash noted. There is erythema.  Vitals reviewed. please see the description in the history of present illness        Assessment & Plan:  Skin ulcer of left great toe - Plan: MR Foot Left W Wo Contrast, ABI  PVD (peripheral vascular disease) - Plan: MR Foot Left W Wo Contrast, ABI  Erythema of toe - Plan: MR Foot Left W Wo Contrast, ABI  Patient is scheduled to see the wound clinic on Friday by my 56 assistant. I believe this is totally appropriate. I will return MRI of the persistent erythema to rule out osteomyelitis of the toe. If there is osteomyelitis of the toe, he will need surgical resection. I will also schedule the patient for arterial Dopplers of the legs to evaluate for significant peripheral artery disease which may complicate the healing of the ulcer

## 2015-06-03 ENCOUNTER — Ambulatory Visit
Admission: RE | Admit: 2015-06-03 | Discharge: 2015-06-03 | Disposition: A | Discharge status: Home / Self Care | Payer: Medicare Other | Source: Ambulatory Visit | Attending: Family Medicine | Admitting: Family Medicine

## 2015-06-03 ENCOUNTER — Other Ambulatory Visit: Payer: Self-pay | Admitting: Family Medicine

## 2015-06-03 DIAGNOSIS — L97529 Non-pressure chronic ulcer of other part of left foot with unspecified severity: Secondary | ICD-10-CM

## 2015-06-03 DIAGNOSIS — L97521 Non-pressure chronic ulcer of other part of left foot limited to breakdown of skin: Secondary | ICD-10-CM

## 2015-06-03 DIAGNOSIS — L03032 Cellulitis of left toe: Secondary | ICD-10-CM | POA: Diagnosis not present

## 2015-06-03 DIAGNOSIS — I739 Peripheral vascular disease, unspecified: Secondary | ICD-10-CM

## 2015-06-03 DIAGNOSIS — L539 Erythematous condition, unspecified: Secondary | ICD-10-CM

## 2015-06-03 MED ORDER — GADOBENATE DIMEGLUMINE 529 MG/ML IV SOLN
10.0000 mL | Freq: Once | INTRAVENOUS | Status: AC | PRN
  Administered 2015-06-03: 10 mL via INTRAVENOUS

## 2015-06-04 ENCOUNTER — Other Ambulatory Visit: Payer: Self-pay | Admitting: Family Medicine

## 2015-06-04 ENCOUNTER — Ambulatory Visit (HOSPITAL_COMMUNITY): Payer: Medicare Other | Attending: Cardiovascular Disease

## 2015-06-04 ENCOUNTER — Ambulatory Visit (HOSPITAL_BASED_OUTPATIENT_CLINIC_OR_DEPARTMENT_OTHER): Payer: Medicare Other

## 2015-06-04 DIAGNOSIS — I739 Peripheral vascular disease, unspecified: Secondary | ICD-10-CM

## 2015-06-04 DIAGNOSIS — L539 Erythematous condition, unspecified: Secondary | ICD-10-CM

## 2015-06-04 DIAGNOSIS — M869 Osteomyelitis, unspecified: Secondary | ICD-10-CM

## 2015-06-04 DIAGNOSIS — L97521 Non-pressure chronic ulcer of other part of left foot limited to breakdown of skin: Secondary | ICD-10-CM | POA: Diagnosis not present

## 2015-06-04 DIAGNOSIS — I10 Essential (primary) hypertension: Secondary | ICD-10-CM | POA: Insufficient documentation

## 2015-06-04 DIAGNOSIS — I700 Atherosclerosis of aorta: Secondary | ICD-10-CM | POA: Insufficient documentation

## 2015-06-04 DIAGNOSIS — I70203 Unspecified atherosclerosis of native arteries of extremities, bilateral legs: Secondary | ICD-10-CM | POA: Insufficient documentation

## 2015-06-04 MED ORDER — SULFAMETHOXAZOLE-TRIMETHOPRIM 800-160 MG PO TABS: 1.0000 | ORAL_TABLET | Freq: Two times a day (BID) | ORAL | Status: DC

## 2015-06-05 ENCOUNTER — Ambulatory Visit: Payer: Medicare Other | Admitting: Surgery

## 2015-06-08 DIAGNOSIS — B351 Tinea unguium: Secondary | ICD-10-CM | POA: Diagnosis not present

## 2015-06-08 DIAGNOSIS — M86172 Other acute osteomyelitis, left ankle and foot: Secondary | ICD-10-CM | POA: Diagnosis not present

## 2015-06-10 ENCOUNTER — Other Ambulatory Visit: Payer: Self-pay | Admitting: Orthopedic Surgery

## 2015-06-17 DIAGNOSIS — I8311 Varicose veins of right lower extremity with inflammation: Secondary | ICD-10-CM | POA: Diagnosis not present

## 2015-06-17 DIAGNOSIS — Z85828 Personal history of other malignant neoplasm of skin: Secondary | ICD-10-CM | POA: Diagnosis not present

## 2015-06-17 DIAGNOSIS — I872 Venous insufficiency (chronic) (peripheral): Secondary | ICD-10-CM | POA: Diagnosis not present

## 2015-06-17 DIAGNOSIS — I8312 Varicose veins of left lower extremity with inflammation: Secondary | ICD-10-CM | POA: Diagnosis not present

## 2015-06-22 ENCOUNTER — Encounter (HOSPITAL_BASED_OUTPATIENT_CLINIC_OR_DEPARTMENT_OTHER): Payer: Self-pay | Admitting: *Deleted

## 2015-06-23 ENCOUNTER — Ambulatory Visit: Payer: Medicare Other | Admitting: Family Medicine

## 2015-06-24 ENCOUNTER — Encounter (HOSPITAL_BASED_OUTPATIENT_CLINIC_OR_DEPARTMENT_OTHER)
Admission: RE | Admit: 2015-06-24 | Discharge: 2015-06-24 | Disposition: A | Discharge status: Home / Self Care | Payer: Medicare Other | Source: Ambulatory Visit | Attending: Orthopedic Surgery | Admitting: Orthopedic Surgery

## 2015-06-24 DIAGNOSIS — K219 Gastro-esophageal reflux disease without esophagitis: Secondary | ICD-10-CM | POA: Diagnosis not present

## 2015-06-24 DIAGNOSIS — G629 Polyneuropathy, unspecified: Secondary | ICD-10-CM | POA: Diagnosis not present

## 2015-06-24 DIAGNOSIS — I10 Essential (primary) hypertension: Secondary | ICD-10-CM | POA: Diagnosis not present

## 2015-06-24 DIAGNOSIS — E039 Hypothyroidism, unspecified: Secondary | ICD-10-CM | POA: Diagnosis not present

## 2015-06-24 DIAGNOSIS — Z79899 Other long term (current) drug therapy: Secondary | ICD-10-CM | POA: Diagnosis not present

## 2015-06-24 DIAGNOSIS — Z885 Allergy status to narcotic agent status: Secondary | ICD-10-CM | POA: Diagnosis not present

## 2015-06-24 DIAGNOSIS — I739 Peripheral vascular disease, unspecified: Secondary | ICD-10-CM | POA: Diagnosis not present

## 2015-06-24 DIAGNOSIS — Z7982 Long term (current) use of aspirin: Secondary | ICD-10-CM | POA: Diagnosis not present

## 2015-06-24 DIAGNOSIS — M869 Osteomyelitis, unspecified: Secondary | ICD-10-CM | POA: Diagnosis not present

## 2015-06-24 DIAGNOSIS — Z8572 Personal history of non-Hodgkin lymphomas: Secondary | ICD-10-CM | POA: Diagnosis not present

## 2015-06-24 LAB — BASIC METABOLIC PANEL
Anion gap: 7 (ref 5–15)
BUN: 27 mg/dL — ABNORMAL HIGH (ref 6–20)
CO2: 23 mmol/L (ref 22–32)
Calcium: 9.2 mg/dL (ref 8.9–10.3)
Chloride: 102 mmol/L (ref 101–111)
Creatinine, Ser: 2.57 mg/dL — ABNORMAL HIGH (ref 0.61–1.24)
GFR calc Af Amer: 23 mL/min — ABNORMAL LOW (ref >60)
GFR calc non Af Amer: 20 mL/min — ABNORMAL LOW (ref >60)
Glucose, Bld: 107 mg/dL — ABNORMAL HIGH (ref 65–99)
Potassium: 5.9 mmol/L — ABNORMAL HIGH (ref 3.5–5.1)
Sodium: 132 mmol/L — ABNORMAL LOW (ref 135–145)

## 2015-06-24 NOTE — Progress Notes (Signed)
BMET reviewed with Dr. Al Corpus. Pt called and told to drink a lot of liquids today and clear liquids all the way up until 11am tomorrow morning (surgery posted for 1500). Pt verbalized understanding.

## 2015-06-25 ENCOUNTER — Ambulatory Visit (HOSPITAL_BASED_OUTPATIENT_CLINIC_OR_DEPARTMENT_OTHER)
Admission: RE | Admit: 2015-06-25 | Discharge: 2015-06-25 | Disposition: A | Discharge status: Home / Self Care | Payer: Medicare Other | Source: Ambulatory Visit | Attending: Orthopedic Surgery | Admitting: Orthopedic Surgery

## 2015-06-25 ENCOUNTER — Encounter (HOSPITAL_BASED_OUTPATIENT_CLINIC_OR_DEPARTMENT_OTHER): Payer: Self-pay | Admitting: Anesthesiology

## 2015-06-25 ENCOUNTER — Ambulatory Visit (HOSPITAL_BASED_OUTPATIENT_CLINIC_OR_DEPARTMENT_OTHER): Payer: Medicare Other | Admitting: Anesthesiology

## 2015-06-25 ENCOUNTER — Encounter (HOSPITAL_BASED_OUTPATIENT_CLINIC_OR_DEPARTMENT_OTHER)
Admission: RE | Disposition: A | Discharge status: Home / Self Care | Payer: Self-pay | Source: Ambulatory Visit | Attending: Orthopedic Surgery

## 2015-06-25 DIAGNOSIS — I739 Peripheral vascular disease, unspecified: Secondary | ICD-10-CM | POA: Diagnosis not present

## 2015-06-25 DIAGNOSIS — Z885 Allergy status to narcotic agent status: Secondary | ICD-10-CM | POA: Insufficient documentation

## 2015-06-25 DIAGNOSIS — B351 Tinea unguium: Secondary | ICD-10-CM | POA: Diagnosis not present

## 2015-06-25 DIAGNOSIS — I10 Essential (primary) hypertension: Secondary | ICD-10-CM | POA: Diagnosis not present

## 2015-06-25 DIAGNOSIS — E039 Hypothyroidism, unspecified: Secondary | ICD-10-CM | POA: Insufficient documentation

## 2015-06-25 DIAGNOSIS — Z79899 Other long term (current) drug therapy: Secondary | ICD-10-CM | POA: Diagnosis not present

## 2015-06-25 DIAGNOSIS — G629 Polyneuropathy, unspecified: Secondary | ICD-10-CM | POA: Diagnosis not present

## 2015-06-25 DIAGNOSIS — M869 Osteomyelitis, unspecified: Secondary | ICD-10-CM | POA: Diagnosis not present

## 2015-06-25 DIAGNOSIS — Z7982 Long term (current) use of aspirin: Secondary | ICD-10-CM | POA: Diagnosis not present

## 2015-06-25 DIAGNOSIS — K219 Gastro-esophageal reflux disease without esophagitis: Secondary | ICD-10-CM | POA: Diagnosis not present

## 2015-06-25 DIAGNOSIS — M86172 Other acute osteomyelitis, left ankle and foot: Secondary | ICD-10-CM | POA: Diagnosis not present

## 2015-06-25 DIAGNOSIS — Z8572 Personal history of non-Hodgkin lymphomas: Secondary | ICD-10-CM | POA: Diagnosis not present

## 2015-06-25 HISTORY — PX: AMPUTATION: SHX166

## 2015-06-25 SURGERY — AMPUTATION DIGIT
Anesthesia: Monitor Anesthesia Care | Site: Foot | Laterality: Left

## 2015-06-25 MED ORDER — MIDAZOLAM HCL 2 MG/2ML IJ SOLN
1.0000 mg | INTRAMUSCULAR | Status: DC | PRN
Start: 2015-06-25 — End: 2015-06-25

## 2015-06-25 MED ORDER — FENTANYL CITRATE (PF) 100 MCG/2ML IJ SOLN
INTRAMUSCULAR | Status: AC
  Filled 2015-06-25: qty 4

## 2015-06-25 MED ORDER — CEFAZOLIN SODIUM-DEXTROSE 2-3 GM-% IV SOLR
2.0000 g | INTRAVENOUS | Status: AC
  Administered 2015-06-25: 2 g via INTRAVENOUS

## 2015-06-25 MED ORDER — GLYCOPYRROLATE 0.2 MG/ML IJ SOLN: 0.2000 mg | Freq: Once | INTRAMUSCULAR | Status: DC | PRN

## 2015-06-25 MED ORDER — 0.9 % SODIUM CHLORIDE (POUR BTL) OPTIME
TOPICAL | Status: DC | PRN
  Administered 2015-06-25: 200 mL

## 2015-06-25 MED ORDER — CEFAZOLIN SODIUM-DEXTROSE 2-3 GM-% IV SOLR
INTRAVENOUS | Status: AC
  Filled 2015-06-25: qty 50

## 2015-06-25 MED ORDER — FENTANYL CITRATE (PF) 100 MCG/2ML IJ SOLN: 25.0000 ug | INTRAMUSCULAR | Status: DC | PRN

## 2015-06-25 MED ORDER — BUPIVACAINE-EPINEPHRINE 0.5% -1:200000 IJ SOLN
INTRAMUSCULAR | Status: DC | PRN
  Administered 2015-06-25: 10 mL

## 2015-06-25 MED ORDER — LACTATED RINGERS IV SOLN
INTRAVENOUS | Status: DC
  Administered 2015-06-25: 15:00:00 via INTRAVENOUS

## 2015-06-25 MED ORDER — SODIUM CHLORIDE 0.9 % IV SOLN: INTRAVENOUS | Status: DC

## 2015-06-25 MED ORDER — FENTANYL CITRATE (PF) 100 MCG/2ML IJ SOLN
50.0000 ug | INTRAMUSCULAR | Status: DC | PRN
  Administered 2015-06-25 (×2): 25 ug via INTRAVENOUS

## 2015-06-25 MED ORDER — BUPIVACAINE-EPINEPHRINE (PF) 0.5% -1:200000 IJ SOLN
INTRAMUSCULAR | Status: AC
  Filled 2015-06-25: qty 30

## 2015-06-25 MED ORDER — PROPOFOL INFUSION 10 MG/ML OPTIME
INTRAVENOUS | Status: DC | PRN
  Administered 2015-06-25: 100 ug/kg/min via INTRAVENOUS

## 2015-06-25 MED ORDER — MEPERIDINE HCL 25 MG/ML IJ SOLN: 6.2500 mg | INTRAMUSCULAR | Status: DC | PRN

## 2015-06-25 MED ORDER — LIDOCAINE HCL (CARDIAC) 20 MG/ML IV SOLN
INTRAVENOUS | Status: DC | PRN
  Administered 2015-06-25: 50 mg via INTRAVENOUS

## 2015-06-25 MED ORDER — SCOPOLAMINE 1 MG/3DAYS TD PT72: 1.0000 | MEDICATED_PATCH | Freq: Once | TRANSDERMAL | Status: DC | PRN

## 2015-06-25 MED ORDER — ONDANSETRON HCL 4 MG/2ML IJ SOLN
INTRAMUSCULAR | Status: DC | PRN
Start: 2015-06-25 — End: 2015-06-25
  Administered 2015-06-25: 4 mg via INTRAVENOUS

## 2015-06-25 MED ORDER — CHLORHEXIDINE GLUCONATE 4 % EX LIQD: 60.0000 mL | Freq: Once | CUTANEOUS | Status: DC

## 2015-06-25 SURGICAL SUPPLY — 73 items
BANDAGE COBAN STERILE 2 (GAUZE/BANDAGES/DRESSINGS) IMPLANT
BANDAGE ESMARK 6X9 LF (GAUZE/BANDAGES/DRESSINGS) IMPLANT
BLADE AVERAGE 25MMX9MM (BLADE)
BLADE AVERAGE 25X9 (BLADE) IMPLANT
BLADE OSC/SAG .038X5.5 CUT EDG (BLADE) IMPLANT
BLADE SURG 10 STRL SS (BLADE) IMPLANT
BLADE SURG 15 STRL LF DISP TIS (BLADE) ×3 IMPLANT
BLADE SURG 15 STRL SS (BLADE) ×8
BNDG CMPR 9X4 STRL LF SNTH (GAUZE/BANDAGES/DRESSINGS) ×2
BNDG CMPR 9X6 STRL LF SNTH (GAUZE/BANDAGES/DRESSINGS)
BNDG COHESIVE 1X5 TAN STRL LF (GAUZE/BANDAGES/DRESSINGS) IMPLANT
BNDG COHESIVE 3X5 TAN STRL LF (GAUZE/BANDAGES/DRESSINGS) IMPLANT
BNDG COHESIVE 4X5 TAN STRL (GAUZE/BANDAGES/DRESSINGS) IMPLANT
BNDG CONFORM 3 STRL LF (GAUZE/BANDAGES/DRESSINGS) ×3 IMPLANT
BNDG ESMARK 4X9 LF (GAUZE/BANDAGES/DRESSINGS) ×4 IMPLANT
BNDG ESMARK 6X9 LF (GAUZE/BANDAGES/DRESSINGS)
BRUSH SCRUB EZ PLAIN DRY (MISCELLANEOUS) ×3 IMPLANT
CHLORAPREP W/TINT 26ML (MISCELLANEOUS) ×1 IMPLANT
COVER BACK TABLE 60X90IN (DRAPES) ×4 IMPLANT
DRAPE EXTREMITY T 121X128X90 (DRAPE) ×4 IMPLANT
DRAPE SURG 17X23 STRL (DRAPES) ×3 IMPLANT
DRAPE U-SHAPE 47X51 STRL (DRAPES) IMPLANT
DRSG EMULSION OIL 3X3 NADH (GAUZE/BANDAGES/DRESSINGS) IMPLANT
DRSG MEPITEL 4X7.2 (GAUZE/BANDAGES/DRESSINGS) ×3 IMPLANT
DRSG PAD ABDOMINAL 8X10 ST (GAUZE/BANDAGES/DRESSINGS) ×3 IMPLANT
ELECT REM PT RETURN 9FT ADLT (ELECTROSURGICAL) ×4
ELECTRODE REM PT RTRN 9FT ADLT (ELECTROSURGICAL) ×2 IMPLANT
GAUZE SPONGE 4X4 12PLY STRL (GAUZE/BANDAGES/DRESSINGS) ×4 IMPLANT
GAUZE SPONGE 4X4 16PLY XRAY LF (GAUZE/BANDAGES/DRESSINGS) IMPLANT
GLOVE BIO SURGEON STRL SZ8 (GLOVE) ×4 IMPLANT
GLOVE BIOGEL PI IND STRL 7.0 (GLOVE) ×1 IMPLANT
GLOVE BIOGEL PI IND STRL 8 (GLOVE) ×3 IMPLANT
GLOVE BIOGEL PI INDICATOR 7.0 (GLOVE) ×2
GLOVE BIOGEL PI INDICATOR 8 (GLOVE) ×2
GLOVE ECLIPSE 6.5 STRL STRAW (GLOVE) ×3 IMPLANT
GLOVE ECLIPSE 7.5 STRL STRAW (GLOVE) ×1 IMPLANT
GLOVE EXAM NITRILE MD LF STRL (GLOVE) ×3 IMPLANT
GOWN STRL REUS W/ TWL LRG LVL3 (GOWN DISPOSABLE) ×2 IMPLANT
GOWN STRL REUS W/ TWL XL LVL3 (GOWN DISPOSABLE) ×3 IMPLANT
GOWN STRL REUS W/TWL LRG LVL3 (GOWN DISPOSABLE) ×4
GOWN STRL REUS W/TWL XL LVL3 (GOWN DISPOSABLE) ×4
NEEDLE HYPO 22GX1.5 SAFETY (NEEDLE) IMPLANT
NS IRRIG 1000ML POUR BTL (IV SOLUTION) ×4 IMPLANT
PACK BASIN DAY SURGERY FS (CUSTOM PROCEDURE TRAY) ×4 IMPLANT
PAD CAST 4YDX4 CTTN HI CHSV (CAST SUPPLIES) ×1 IMPLANT
PADDING CAST ABS 4INX4YD NS (CAST SUPPLIES) ×2
PADDING CAST ABS COTTON 4X4 ST (CAST SUPPLIES) ×2 IMPLANT
PADDING CAST COTTON 4X4 STRL (CAST SUPPLIES) ×4
PENCIL BUTTON HOLSTER BLD 10FT (ELECTRODE) ×3 IMPLANT
SANITIZER HAND PURELL 535ML FO (MISCELLANEOUS) IMPLANT
SHEET MEDIUM DRAPE 40X70 STRL (DRAPES) ×4 IMPLANT
SLEEVE SCD COMPRESS KNEE MED (MISCELLANEOUS) ×4 IMPLANT
SPONGE LAP 18X18 X RAY DECT (DISPOSABLE) ×4 IMPLANT
STOCKINETTE 6  STRL (DRAPES) ×2
STOCKINETTE 6 STRL (DRAPES) ×2 IMPLANT
SUCTION FRAZIER TIP 10 FR DISP (SUCTIONS) IMPLANT
SUT ETHILON 2 0 FS 18 (SUTURE) IMPLANT
SUT ETHILON 3 0 FSL (SUTURE) IMPLANT
SUT ETHILON 3 0 PS 1 (SUTURE) ×8 IMPLANT
SUT ETHILON 4 0 PS 2 18 (SUTURE) IMPLANT
SUT MNCRL AB 3-0 PS2 18 (SUTURE) IMPLANT
SUT VIC AB 2-0 CT1 27 (SUTURE)
SUT VIC AB 2-0 CT1 TAPERPNT 27 (SUTURE) IMPLANT
SUT VIC AB 3-0 PS1 18 (SUTURE)
SUT VIC AB 3-0 PS1 18XBRD (SUTURE) IMPLANT
SYR BULB 3OZ (MISCELLANEOUS) ×4 IMPLANT
SYR CONTROL 10ML LL (SYRINGE) ×3 IMPLANT
TOWEL OR 17X24 6PK STRL BLUE (TOWEL DISPOSABLE) ×7 IMPLANT
TRAY DSU PREP LF (CUSTOM PROCEDURE TRAY) ×3 IMPLANT
TUBE CONNECTING 20'X1/4 (TUBING)
TUBE CONNECTING 20X1/4 (TUBING) IMPLANT
UNDERPAD 30X30 (UNDERPADS AND DIAPERS) ×4 IMPLANT
YANKAUER SUCT BULB TIP NO VENT (SUCTIONS) IMPLANT

## 2015-06-25 NOTE — Anesthesia Postprocedure Evaluation (Signed)
  Anesthesia Post-op Note  Patient: [name]  Procedure(s) Performed: Procedure(s) with comments: LEFT  HALLUX AMPUTATION, (Left) - LOCAL/MAC  Patient Location: PACU  Anesthesia Type: MAC   Level of Consciousness: awake, alert  and oriented  Airway and Oxygen Therapy: Patient Spontanous Breathing  Post-op Pain: none  Post-op Assessment: Post-op Vital signs reviewed  Post-op Vital Signs: Reviewed  Last Vitals:  Filed Vitals:   06/25/15 1615  BP: 171/76  Pulse: 64  Temp:   Resp: 13    Complications: No apparent anesthesia complications

## 2015-06-25 NOTE — Transfer of Care (Signed)
Immediate Anesthesia Transfer of Care Note  Patient: [name]  Procedure(s) Performed: Procedure(s) with comments: LEFT  HALLUX AMPUTATION, (Left) - LOCAL/MAC  Patient Location: PACU  Anesthesia Type:MAC  Level of Consciousness: awake, alert  and oriented  Airway & Oxygen Therapy: Patient Spontanous Breathing  Post-op Assessment: Report given to RN and Post -op Vital signs reviewed and stable  Post vital signs: Reviewed and stable  Last Vitals: There were no vitals filed for this visit.  Complications: No apparent anesthesia complications

## 2015-06-25 NOTE — Discharge Instructions (Addendum)
Jimmy Simmer, MD [facility]  Please read the following information regarding your care after surgery.  Medications  You only need a prescription for the narcotic pain medicine (ex. oxycodone, Percocet, Norco).  All of the other medicines listed below are available over the counter. X acetominophen (Tylenol) 650 mg every 4-6 hours as you need for pain  Weight Bearing X Bear weight when you are able on your operated leg or foot in the post-op shoe.  Cast / Splint / Dressing X Keep your dressing clean and dry.  Dont put anything (coat hanger, pencil, etc) down inside of it.  If it gets damp, use a hair dryer on the cool setting to dry it.  If it gets soaked, call the office to schedule an appointment for a dressing change.   After your dressing, cast or splint is removed; you may shower, but do not soak or scrub the wound.  Allow the water to run over it, and then gently pat it dry.  Swelling It is normal for you to have swelling where you had surgery.  To reduce swelling and pain, keep your toes above your nose for at least 3 days after surgery.  It may be necessary to keep your foot or leg elevated for several weeks.  If it hurts, it should be elevated.  Follow Up Call my office at [phone] when you are discharged from the hospital or surgery center to schedule an appointment to be seen two weeks after surgery.  Call my office at [phone] if you develop a fever >101.5 F, nausea, vomiting, bleeding from the surgical site or severe pain.      Post Anesthesia Home Care Instructions  Activity: Get plenty of rest for the remainder of the day. A responsible adult should stay with you for 24 hours following the procedure.  For the next 24 hours, DO NOT: -Drive a car -Paediatric nurse -Drink alcoholic beverages -Take any medication unless instructed by your physician -Make any legal decisions or sign important papers.  Meals: Start with liquid foods such as  gelatin or soup. Progress to regular foods as tolerated. Avoid greasy, spicy, heavy foods. If nausea and/or vomiting occur, drink only clear liquids until the nausea and/or vomiting subsides. Call your physician if vomiting continues.  Special Instructions/Symptoms: Your throat may feel dry or sore from the anesthesia or the breathing tube placed in your throat during surgery. If this causes discomfort, gargle with warm salt water. The discomfort should disappear within 24 hours.  If you had a scopolamine patch placed behind your ear for the management of post- operative nausea and/or vomiting:  1. The medication in the patch is effective for 72 hours, after which it should be removed.  Wrap patch in a tissue and discard in the trash. Wash hands thoroughly with soap and water. 2. You may remove the patch earlier than 72 hours if you experience unpleasant side effects which may include dry mouth, dizziness or visual disturbances. 3. Avoid touching the patch. Wash your hands with soap and water after contact with the patch.

## 2015-06-25 NOTE — Anesthesia Preprocedure Evaluation (Signed)
Anesthesia Evaluation  Patient identified by MRN, date of birth, ID band Patient awake    Reviewed: Allergy & Precautions, NPO status , Patient's Chart, lab work & pertinent test results  Airway Mallampati: I  TM Distance: >3 FB Neck ROM: Full    Dental  (+) Edentulous Upper, Edentulous Lower   Pulmonary  breath sounds clear to auscultation        Cardiovascular hypertension, Pt. on medications Rhythm:Regular Rate:Normal     Neuro/Psych    GI/Hepatic GERD-  Medicated and Controlled,  Endo/Other    Renal/GU      Musculoskeletal   Abdominal   Peds  Hematology   Anesthesia Other Findings   Reproductive/Obstetrics                             Anesthesia Physical Anesthesia Plan  ASA: III  Anesthesia Plan: MAC   Post-op Pain Management:    Induction: Intravenous  Airway Management Planned: Simple Face Mask  Additional Equipment:   Intra-op Plan:   Post-operative Plan:   Informed Consent: I have reviewed the patients History and Physical, chart, labs and discussed the procedure including the risks, benefits and alternatives for the proposed anesthesia with the patient or authorized representative who has indicated his/her understanding and acceptance.     Plan Discussed with: CRNA, Anesthesiologist and Surgeon  Anesthesia Plan Comments:         Anesthesia Quick Evaluation

## 2015-06-25 NOTE — H&P (Signed)
[name] is an 81 y.o. male.   Chief Complaint: left hallux osteomyelitis HPI: 81 y/o male with PMH of peripheral vascular disease and a non healing ulcer of the left hallux with osteomyelitis.  He presents now for left hallux amputation.  Past Medical History  Diagnosis Date  . Malaria     while in the service  . Peripheral vascular disease     slow wound healing on legs  . Acid reflux   . Hypertension   . Hypertension     requires diuretic  . Lymphadenopathy, cervical 08/29/2013  . Hypothyroidism   . Cancer 09/05/13    left neck-non-hodgkins lymphoma    Past Surgical History  Procedure Laterality Date  . Tonsillectomy    . Mass biopsy Left 09/05/2013    Procedure: EXCISIONAL BIOPSY OF LEFT NECK MASS;  Surgeon: Jerrell Belfast, MD;  Location: [facility];  Service: ENT;  Laterality: Left;    History reviewed. No pertinent family history. Social History:  reports that he has never smoked. He quit smokeless tobacco use about 35 years ago. He reports that he does not drink alcohol or use illicit drugs.  Allergies:  Allergies  Allergen Reactions  . Codeine Nausea And Vomiting  . Hydrocodone Nausea And Vomiting  . Percocet [Oxycodone-Acetaminophen] Nausea And Vomiting    Medications Prior to Admission  Medication Sig Dispense Refill  . acetaminophen (TYLENOL) 325 MG tablet Take 325 mg by mouth every 6 (six) hours as needed for pain.     Marland Kitchen aspirin EC 81 MG tablet Take 1 tablet (81 mg total) by mouth daily.    . enalapril (VASOTEC) 20 MG tablet Take 1 tablet by mouth  every day 90 tablet 3  . fluocinonide-emollient (LIDEX-E) 0.05 % cream Apply 1 application topically 2 (two) times daily. 60 g 0  . furosemide (LASIX) 20 MG tablet Take 1 tablet by mouth  every day 90 tablet 3  . levothyroxine (SYNTHROID, LEVOTHROID) 88 MCG tablet Take 1 tablet by mouth  every day 90 tablet 3  . meclizine (ANTIVERT) 25 MG tablet Take 1 tablet (25 mg total) by mouth 3 (three) times daily as needed for  dizziness. 30 tablet 0  . omeprazole (PRILOSEC) 20 MG capsule Take 20 mg by mouth as needed.     . sulfamethoxazole-trimethoprim (BACTRIM DS,SEPTRA DS) 800-160 MG per tablet Take 1 tablet by mouth 2 (two) times daily. 28 tablet 0  . temazepam (RESTORIL) 15 MG capsule Take 1 capsule (15 mg total) by mouth at bedtime as needed for sleep. 30 capsule 0  . vitamin B-12 (CYANOCOBALAMIN) 1000 MCG tablet Take 1 tablet (1,000 mcg total) by mouth daily. 30 tablet 5    Results for orders placed or performed during the hospital encounter of 06/25/15 (from the past 48 hour(s))  Basic metabolic panel     Status: Abnormal   Collection Time: 06/24/15  2:51 PM  Result Value Ref Range   Sodium 132 (L) 135 - 145 mmol/L   Potassium 5.9 (H) 3.5 - 5.1 mmol/L   Chloride 102 101 - 111 mmol/L   CO2 23 22 - 32 mmol/L   Glucose, Bld 107 (H) 65 - 99 mg/dL   BUN 27 (H) 6 - 20 mg/dL   Creatinine, Ser 2.57 (H) 0.61 - 1.24 mg/dL   Calcium 9.2 8.9 - 10.3 mg/dL   GFR calc non Af Amer 20 (L) >60 mL/min   GFR calc Af Amer 23 (L) >60 mL/min    Comment: (NOTE)  The eGFR has been calculated using the CKD EPI equation. This calculation has not been validated in all clinical situations. eGFR's persistently <60 mL/min signify possible Chronic Kidney Disease.    Anion gap 7 5 - 15   No results found.  ROS  No recent f/c/n/v/wt loss  Height '6\' 1"'  (1.854 m), weight 92.987 kg (205 lb). Physical Exam  wn wd elderly male in nad.  A and O x 4.  Mood and affect normal.  EOMi.  resp unlabored.  L hallux with ulcer at the tip.  Bone is exposed.  No cellulitis.  Brisk cap refill at toes.  No lymphadenopathy.  5/5 strength in PF and DF o f the ankel and toes.  Assessment/Plan L hallux osteomyelitis - to OR for left hallux amputation.  The risks and benefits of the alternative treatment options have been discussed in detail.  The patient wishes to proceed with surgery and specifically understands risks of bleeding, infection, nerve  damage, blood clots, need for additional surgery, amputation and death.  The patient says that his R hallux is no longer bothering him.  He elects to defer the right hallux nail removal indefinitely.  Wylene Simmer 06/25/2015, 2:59 PM

## 2015-06-25 NOTE — Anesthesia Procedure Notes (Signed)
Procedure Name: MAC Performed by: Vietta Bonifield W Pre-anesthesia Checklist: Patient identified, Timeout performed, Emergency Drugs available, Suction available and Patient being monitored Oxygen Delivery Method: Simple face mask Placement Confirmation: positive ETCO2       

## 2015-06-25 NOTE — Brief Op Note (Signed)
06/25/2015  3:56 PM  PATIENT:  [name]  81 y.o. male  PRE-OPERATIVE DIAGNOSIS:  Left hallux osteomyelitis  POST-OPERATIVE DIAGNOSIS:  Same  Procedure(s): LEFT HALLUX AMPUTATION at the IP joint  SURGEON:  Wylene Simmer, MD  ASSISTANT: n/a  ANESTHESIA:   Local, MAC  EBL:  minimal   TOURNIQUET:   Total Tourniquet Time Documented: Leg (Left) - 10 minutes Total: Leg (Left) - 10 minutes  COMPLICATIONS:  None apparent  DISPOSITION:  Extubated, awake and stable to recovery.  DICTATION ID:  [id number]

## 2015-06-26 ENCOUNTER — Encounter (HOSPITAL_BASED_OUTPATIENT_CLINIC_OR_DEPARTMENT_OTHER): Payer: Self-pay | Admitting: Orthopedic Surgery

## 2015-06-26 NOTE — Progress Notes (Signed)
Called patient for followup re concern of bleeding.  No answer.  Patients daughter had been instructed this morning around 930a to inform Dr. Doran Durand of her father's bleeding with ambulation.

## 2015-06-26 NOTE — Op Note (Signed)
NAMEPERSEUS, Jimmy NO.:  [id number]  MEDICAL RECORD NO.:  [id number]  LOCATION:                               FACILITY:  [facility]  PHYSICIAN:  Wylene Simmer, MD        DATE OF BIRTH:  1919-09-20  DATE OF PROCEDURE:  06/25/2015 DATE OF DISCHARGE:  06/25/2015                              OPERATIVE REPORT   PREOPERATIVE DIAGNOSIS:  Left hallux osteomyelitis.  POSTOPERATIVE DIAGNOSIS:  Left hallux osteomyelitis.  PROCEDURE:  Left hallux amputation at the interphalangeal joint.  SURGEON:  Wylene Simmer, MD  ANESTHESIA:  Local, MAC.  ESTIMATED BLOOD LOSS:  Minimal.  TOURNIQUET TIME:  Approximately 10 minutes with an ankle Esmarch.  COMPLICATIONS:  None apparent.  DISPOSITION:  Extubated, awake and stable to recovery.  INDICATIONS FOR PROCEDURE:  The patient is a 81 year old male with past medical history significant for peripheral neuropathy and peripheral vascular disease.  He developed an ulcer at the tip of his left hallux over a month ago.  He developed cellulitis and was treated with a round of antibiotics by his primary care physician, Dr. Dennard Rodriguez.  The patient then had an MRI of his left hallux.  This showed changes at the distal phalanx consistent with osteomyelitis.  He presents now for amputation of the toe.  He understands the risks and benefits of the alternative treatment options, and elects surgical treatment.  He specifically understands risks of bleeding, infection, nerve damage, blood clots, need for additional surgery, continued pain, revision amputation, and death.  PROCEDURE IN DETAIL:  After preoperative consent was obtained and the correct operative site was identified, the patient was brought to the operating room and placed supine on the operating table.  IV sedation was administered.  Preoperative antibiotics were administered.  Surgical time-out was taken.  Left foot was then prepped and draped in standard sterile fashion.  The  operative site was noted to have an ulcer at the tip of the toe with exposed bone.  A fishmouth incision was marked around the toe at the level of the IP joint.  Foot was exsanguinated and a 4-inch Esmarch tourniquet was wrapped around the ankle.  The incision was made.  Sharp dissection was carried down to the level of the bone circumferentially.  The toe was disarticulated at the level of IP joint. The head of the proximal phalanx was then removed with a bone cutter. The cut surface of bone was smoothed with a rasp.  The wound was irrigated copiously.  The neurovascular bundles were cauterized.  The incision was then closed with horizontal mattress and simple sutures of 3-0 nylon.  Sterile dressings were applied followed by a compression wrap.  Tourniquet was released at 10 minutes.  The patient was awakened from anesthesia and transported to the recovery room in stable condition.  Local anesthetic had been performed with 0.5% Marcaine with epinephrine proximal to the incision.  The patient was then awakened from anesthesia and transported to the recovery room in stable condition.  FOLLOWUP PLAN:  The patient will be weightbearing as tolerated on his left foot in a flat postop shoe.  He will follow up with me  in 2 weeks for suture removal.  He can resume his regular medications today.     Wylene Simmer, MD     JH/MEDQ  D:  06/25/2015  T:  06/26/2015  Job:  [id number]

## 2015-07-22 ENCOUNTER — Other Ambulatory Visit: Payer: Self-pay | Admitting: Family Medicine

## 2015-07-31 DIAGNOSIS — L309 Dermatitis, unspecified: Secondary | ICD-10-CM | POA: Diagnosis not present

## 2015-07-31 DIAGNOSIS — Z85828 Personal history of other malignant neoplasm of skin: Secondary | ICD-10-CM | POA: Diagnosis not present

## 2015-09-10 ENCOUNTER — Ambulatory Visit: Payer: Medicare Other

## 2015-10-02 DIAGNOSIS — I8312 Varicose veins of left lower extremity with inflammation: Secondary | ICD-10-CM | POA: Diagnosis not present

## 2015-10-02 DIAGNOSIS — C44212 Basal cell carcinoma of skin of right ear and external auricular canal: Secondary | ICD-10-CM | POA: Diagnosis not present

## 2015-10-02 DIAGNOSIS — I8311 Varicose veins of right lower extremity with inflammation: Secondary | ICD-10-CM | POA: Diagnosis not present

## 2015-10-02 DIAGNOSIS — L570 Actinic keratosis: Secondary | ICD-10-CM | POA: Diagnosis not present

## 2015-10-02 DIAGNOSIS — C44619 Basal cell carcinoma of skin of left upper limb, including shoulder: Secondary | ICD-10-CM | POA: Diagnosis not present

## 2015-10-02 DIAGNOSIS — Z85828 Personal history of other malignant neoplasm of skin: Secondary | ICD-10-CM | POA: Diagnosis not present

## 2015-10-02 DIAGNOSIS — C44319 Basal cell carcinoma of skin of other parts of face: Secondary | ICD-10-CM | POA: Diagnosis not present

## 2015-10-05 ENCOUNTER — Ambulatory Visit (HOSPITAL_BASED_OUTPATIENT_CLINIC_OR_DEPARTMENT_OTHER): Payer: Medicare Other | Admitting: Oncology

## 2015-10-05 ENCOUNTER — Telehealth: Payer: Self-pay | Admitting: Oncology

## 2015-10-05 VITALS — BP 189/79 | HR 69 | Temp 97.9°F | Resp 18 | Ht 73.0 in | Wt 207.7 lb

## 2015-10-05 DIAGNOSIS — C8580 Other specified types of non-Hodgkin lymphoma, unspecified site: Secondary | ICD-10-CM

## 2015-10-05 DIAGNOSIS — C8331 Diffuse large B-cell lymphoma, lymph nodes of head, face, and neck: Secondary | ICD-10-CM

## 2015-10-05 DIAGNOSIS — I10 Essential (primary) hypertension: Secondary | ICD-10-CM

## 2015-10-05 NOTE — Telephone Encounter (Signed)
Gave patient avs report and appointments for May 2017.  °

## 2015-10-05 NOTE — Progress Notes (Signed)
  [name]   Diagnosis: Non-Hodgkin's lymphoma  INTERVAL HISTORY:   [name] returns as scheduled. He feels well. No palpable lymph nodes. Good appetite. He helps take care of his wife with Alzheimer's.  Objective:  Vital signs in last 24 hours:  Blood pressure 189/79, pulse 69, temperature 97.9 F (36.6 C), temperature source Oral, resp. rate 18, height $RemoveBe'6\' 1"'sbbubmQWn$  (1.854 m), weight 207 lb 11.2 oz (94.212 kg), SpO2 100 %.    HEENT: Neck without mass Lymphatics: No cervical, supraclavicular, axillary, or inguinal nodes Resp: Lungs clear bilaterally Cardio: Regular rate and rhythm GI: No hepatosplenomegaly, no mass Vascular: Support stockings in place over both legs, no edema  Skin: 1 cm cyst at the mid back   Portacath/PICC-without erythema  Lab Results:  Lab Results  Component Value Date   WBC 4.7 11/17/2014   HGB 14.7 11/17/2014   HCT 43.8 11/17/2014   MCV 94.0 11/17/2014   PLT 163 11/17/2014   NEUTROABS 2.6 11/17/2014    Medications: I have reviewed the patient's current medications.  Assessment/Plan: 1. Diffuse large B-cell lymphoma diagnosed September 2014 presenting with a painless left neck mass.  Excisional biopsy 09/05/2013 showed a diffuse large B-cell lymphoma CD20 and CD79a positive.   No other areas of adenopathy on staging scans.   Staging bone marrow biopsy declined.   Chemotherapy declined.   Completed radiation 10/31/2013 through 11/13/2013.   Restaging CT neck and chest on 04/08/2014 showed no adenopathy. 2. Skin lesions-followed by dermatology 3. Hypertension.   Disposition:  He remains in clinical remission from non-Hodgkin's lymphoma. [name] will return for an office visit in 6 months. He will contact us in the interim for new symptoms. His son reports his daughter checks [name]' blood pressure at home and the readings have been normal.  Betsy Coder, MD  10/05/2015  2:21  PM

## 2016-01-18 ENCOUNTER — Ambulatory Visit (INDEPENDENT_AMBULATORY_CARE_PROVIDER_SITE_OTHER): Payer: Medicare Other | Admitting: Family Medicine

## 2016-01-18 ENCOUNTER — Encounter: Payer: Self-pay | Admitting: Family Medicine

## 2016-01-18 VITALS — BP 156/92 | HR 80 | Temp 98.6°F | Resp 16 | Ht 73.0 in

## 2016-01-18 DIAGNOSIS — R05 Cough: Secondary | ICD-10-CM

## 2016-01-18 DIAGNOSIS — R059 Cough, unspecified: Secondary | ICD-10-CM

## 2016-01-18 DIAGNOSIS — J101 Influenza due to other identified influenza virus with other respiratory manifestations: Secondary | ICD-10-CM | POA: Diagnosis not present

## 2016-01-18 DIAGNOSIS — J3489 Other specified disorders of nose and nasal sinuses: Secondary | ICD-10-CM

## 2016-01-18 LAB — INFLUENZA A AND B AG, IMMUNOASSAY
Influenza A Antigen: NOT DETECTED
Influenza B Antigen: DETECTED — AB

## 2016-01-18 MED ORDER — OSELTAMIVIR PHOSPHATE 75 MG PO CAPS: 75.0000 mg | ORAL_CAPSULE | Freq: Two times a day (BID) | ORAL | Status: DC

## 2016-01-18 NOTE — Progress Notes (Signed)
Subjective:    Patient ID: [name], male    DOB: December 21, 1918, 82 y.o.   MRN: [id number]  HPI His symptoms began Friday with rhinorrhea and head congestion. He subsequently developed a nonproductive cough. He denies any chest pain. He denies any shortness of breath. He denies any pleurisy. He denies any purulent sputum or hemoptysis. He denies any sinus pain or otalgia or sore throat. He denies any myalgias. His biggest concern is persistent head congestion, rhinorrhea, and nonproductive cough consistent with an upper respiratory infection. His son, his daughter, and his wife all have similar symptoms although his wife is being treated for pneumonia by another physician. Past Medical History  Diagnosis Date  . Malaria     while in the service  . Peripheral vascular disease (HCC)     slow wound healing on legs  . Acid reflux   . Hypertension   . Hypertension     requires diuretic  . Lymphadenopathy, cervical 08/29/2013  . Hypothyroidism   . Cancer ([facility]) 09/05/13    left neck-non-hodgkins lymphoma   Past Surgical History  Procedure Laterality Date  . Tonsillectomy    . Mass biopsy Left 09/05/2013    Procedure: EXCISIONAL BIOPSY OF LEFT NECK MASS;  Surgeon: Jerrell Belfast, MD;  Location: [facility];  Service: ENT;  Laterality: Left;  . Amputation Left 06/25/2015    Procedure: LEFT  HALLUX AMPUTATION,;  Surgeon: Wylene Simmer, MD;  Location: [facility];  Service: Orthopedics;  Laterality: Left;  LOCAL/MAC   Current Outpatient Prescriptions on File Prior to Visit  Medication Sig Dispense Refill  . acetaminophen (TYLENOL) 325 MG tablet Take 325 mg by mouth every 6 (six) hours as needed for pain.     Marland Kitchen aspirin EC 81 MG tablet Take 1 tablet (81 mg total) by mouth daily.    . enalapril (VASOTEC) 20 MG tablet Take 1 tablet by mouth  every day 90 tablet 3  . fluocinonide-emollient (LIDEX-E) 0.05 % cream Apply 1 application topically 2 (two) times daily. 60 g 0  . furosemide (LASIX)  20 MG tablet Take 1 tablet by mouth  every day 90 tablet 3  . levothyroxine (SYNTHROID, LEVOTHROID) 88 MCG tablet Take 1 tablet by mouth  every day 90 tablet 3  . meclizine (ANTIVERT) 25 MG tablet Take 1 tablet (25 mg total) by mouth 3 (three) times daily as needed for dizziness. 30 tablet 0  . omeprazole (PRILOSEC) 20 MG capsule Take 20 mg by mouth as needed.     . sulfamethoxazole-trimethoprim (BACTRIM DS,SEPTRA DS) 800-160 MG per tablet Take 1 tablet by mouth 2 (two) times daily. 28 tablet 0  . temazepam (RESTORIL) 15 MG capsule Take 1 capsule (15 mg total) by mouth at bedtime as needed for sleep. 30 capsule 0  . vitamin B-12 (CYANOCOBALAMIN) 1000 MCG tablet Take 1 tablet (1,000 mcg total) by mouth daily. 30 tablet 5  . [DISCONTINUED] famotidine (PEPCID) 20 MG tablet Take 20 mg by mouth 2 (two) times daily.     No current facility-administered medications on file prior to visit.   Allergies  Allergen Reactions  . Codeine Nausea And Vomiting  . Hydrocodone Nausea And Vomiting  . Percocet [Oxycodone-Acetaminophen] Nausea And Vomiting   Social History   Social History  . Marital Status: Married    Spouse Name: N/A  . Number of Children: 4  . Years of Education: N/A   Occupational History  . Not on file.   Social History Main  Topics  . Smoking status: Never Smoker   . Smokeless tobacco: Former Systems developer    Quit date: 10/05/1979  . Alcohol Use: No  . Drug Use: No  . Sexual Activity: Not on file   Other Topics Concern  . Not on file   Social History Narrative      Review of Systems  All other systems reviewed and are negative.      Objective:   Physical Exam  Constitutional: He appears well-developed and well-nourished. No distress.  HENT:  Right Ear: Tympanic membrane, external ear and ear canal normal.  Left Ear: Tympanic membrane, external ear and ear canal normal.  Nose: Mucosal edema and rhinorrhea present. Right sinus exhibits no maxillary sinus tenderness and no  frontal sinus tenderness. Left sinus exhibits no maxillary sinus tenderness and no frontal sinus tenderness.  Mouth/Throat: Oropharynx is clear and moist. No oropharyngeal exudate, posterior oropharyngeal edema or posterior oropharyngeal erythema.  Eyes: Conjunctivae are normal.  Neck: Neck supple.  Pulmonary/Chest: Effort normal and breath sounds normal. No respiratory distress. He has no wheezes. He has no rales.  Lymphadenopathy:    He has no cervical adenopathy.  Skin: No rash noted. He is not diaphoretic.  Vitals reviewed.         Assessment & Plan:  Cough - Plan: Influenza A and B Ag, Immunoassay  Stuffy and runny nose - Plan: Influenza A and B Ag, Immunoassay  I believe the patient is suffering from a viral upper respiratory infection. I would have a very low threshold for treating this patient with antibiotics given his advanced age. However I believe this is a simple cold and unfortunately will need tincture of time to improve. Recommended rest, fluids, and Coricidin HBP for congestion. He can use Robitussin-DM for cough. I will check the patient for flu.  Flu test returned positive for influenza type B. Begin Tamiflu 75 mg by mouth twice a day for 5 days. Son has similar symptoms so I treated him as well with Tamiflu 75 mg by mouth twice a day for 5 days. Son is also my patient, [name].

## 2016-02-04 DIAGNOSIS — L570 Actinic keratosis: Secondary | ICD-10-CM | POA: Diagnosis not present

## 2016-02-04 DIAGNOSIS — I8311 Varicose veins of right lower extremity with inflammation: Secondary | ICD-10-CM | POA: Diagnosis not present

## 2016-02-04 DIAGNOSIS — Z85828 Personal history of other malignant neoplasm of skin: Secondary | ICD-10-CM | POA: Diagnosis not present

## 2016-02-04 DIAGNOSIS — B351 Tinea unguium: Secondary | ICD-10-CM | POA: Diagnosis not present

## 2016-02-04 DIAGNOSIS — I8312 Varicose veins of left lower extremity with inflammation: Secondary | ICD-10-CM | POA: Diagnosis not present

## 2016-03-09 DIAGNOSIS — I8312 Varicose veins of left lower extremity with inflammation: Secondary | ICD-10-CM | POA: Diagnosis not present

## 2016-03-09 DIAGNOSIS — I8311 Varicose veins of right lower extremity with inflammation: Secondary | ICD-10-CM | POA: Diagnosis not present

## 2016-03-09 DIAGNOSIS — Z85828 Personal history of other malignant neoplasm of skin: Secondary | ICD-10-CM | POA: Diagnosis not present

## 2016-04-04 ENCOUNTER — Ambulatory Visit (HOSPITAL_BASED_OUTPATIENT_CLINIC_OR_DEPARTMENT_OTHER): Payer: Medicare Other | Admitting: Oncology

## 2016-04-04 VITALS — BP 178/71 | HR 61 | Temp 97.6°F | Resp 17 | Ht 73.0 in | Wt 198.0 lb

## 2016-04-04 DIAGNOSIS — C8331 Diffuse large B-cell lymphoma, lymph nodes of head, face, and neck: Secondary | ICD-10-CM

## 2016-04-04 DIAGNOSIS — Z8572 Personal history of non-Hodgkin lymphomas: Secondary | ICD-10-CM | POA: Diagnosis not present

## 2016-04-04 NOTE — Progress Notes (Signed)
  [facility] OFFICE PROGRESS NOTE   Diagnosis:  Non-Hodgkin's lymphoma  INTERVAL HISTORY:    [name] returns as scheduled. He is here with his son. He eats less now that he is being served "meals on wheels ". No palpable lymph nodes. He complains of vision loss in the right eye. He is scheduled to see ophthalmology tomorrow. He has chronic conjunctival erythema. He complains of not getting enough sleep secondary to his wife's dementia.  Objective:  Vital signs in last 24 hours:  Blood pressure 178/71, pulse 61, temperature 97.6 F (36.4 C), temperature source Oral, resp. rate 17, height '6\' 1"'$  (1.854 m), weight 198 lb (89.812 kg), SpO2 100 %.    HEENT:  Oropharynx without visible mass, neck without mass, bilateral conjunctival erythema Lymphatics:  No cervical, supra-clavicular, axillary, or inguinal nodes Resp:  Lungs clear bilaterally Cardio:  Regular rate and rhythm GI:  No hepatosplenomegaly Vascular:  Chronic stasis change at the lower leg bilaterally, the right lower leg is slightly larger than the left side , no edema  Medications: I have reviewed the patient's current medications.  Assessment/Plan: 1. Diffuse large B-cell lymphoma diagnosed September 2014 presenting with a painless left neck mass.  Excisional biopsy 09/05/2013 showed a diffuse large B-cell lymphoma CD20 and CD79a positive.   No other areas of adenopathy on staging scans.   Staging bone marrow biopsy declined.   Chemotherapy declined.   Completed radiation 10/31/2013 through 11/13/2013.   Restaging CT neck and chest on 04/08/2014 showed no adenopathy. 2. Skin lesions-followed by dermatology 3. Hypertension.   Disposition:   [name] remains in clinical remission from non-Hodgkin's lymphoma. He would like to continue follow-up with Dr. Dennard Schaumann. I am available to see him in the future as needed. His son will contact us if he develops lymphadenopathy.  Betsy Coder,  MD  04/04/2016  12:54 PM

## 2016-04-05 DIAGNOSIS — H1712 Central corneal opacity, left eye: Secondary | ICD-10-CM | POA: Diagnosis not present

## 2016-04-05 DIAGNOSIS — H26491 Other secondary cataract, right eye: Secondary | ICD-10-CM | POA: Diagnosis not present

## 2016-04-05 DIAGNOSIS — Z961 Presence of intraocular lens: Secondary | ICD-10-CM | POA: Diagnosis not present

## 2016-04-05 DIAGNOSIS — H2512 Age-related nuclear cataract, left eye: Secondary | ICD-10-CM | POA: Diagnosis not present

## 2016-04-19 ENCOUNTER — Encounter (HOSPITAL_COMMUNITY)
Admission: RE | Disposition: A | Discharge status: Home / Self Care | Payer: Self-pay | Source: Ambulatory Visit | Attending: Ophthalmology

## 2016-04-19 ENCOUNTER — Encounter (HOSPITAL_COMMUNITY): Payer: Self-pay | Admitting: *Deleted

## 2016-04-19 ENCOUNTER — Ambulatory Visit (HOSPITAL_COMMUNITY)
Admission: RE | Admit: 2016-04-19 | Discharge: 2016-04-19 | Disposition: A | Discharge status: Home / Self Care | Payer: Medicare Other | Source: Ambulatory Visit | Attending: Ophthalmology | Admitting: Ophthalmology

## 2016-04-19 DIAGNOSIS — H26491 Other secondary cataract, right eye: Secondary | ICD-10-CM | POA: Diagnosis not present

## 2016-04-19 DIAGNOSIS — I10 Essential (primary) hypertension: Secondary | ICD-10-CM | POA: Diagnosis not present

## 2016-04-19 DIAGNOSIS — H2640 Unspecified secondary cataract: Secondary | ICD-10-CM | POA: Diagnosis not present

## 2016-04-19 DIAGNOSIS — Z7982 Long term (current) use of aspirin: Secondary | ICD-10-CM | POA: Insufficient documentation

## 2016-04-19 DIAGNOSIS — Z79899 Other long term (current) drug therapy: Secondary | ICD-10-CM | POA: Diagnosis not present

## 2016-04-19 HISTORY — PX: YAG LASER APPLICATION: SHX6189

## 2016-04-19 SURGERY — TREATMENT, USING YAG LASER
Anesthesia: LOCAL | Laterality: Right

## 2016-04-19 MED ORDER — TROPICAMIDE 1 % OP SOLN
1.0000 [drp] | OPHTHALMIC | Status: AC
  Administered 2016-04-19 (×3): 1 [drp] via OPHTHALMIC

## 2016-04-19 MED ORDER — TROPICAMIDE 1 % OP SOLN
OPHTHALMIC | Status: AC
  Filled 2016-04-19: qty 3

## 2016-04-19 NOTE — Op Note (Signed)
Jimmy Shams T. Gershon Crane, MD  Procedure: Yag Capsulotomy  Yag Laser Self Test Completedyes. Procedure: Posterior Capsulotomy, Eye Protection Worn by Staff yes. Laser In Use Sign on Door yes.  Laser: Nd:YAG Spot Size: Fixed Burst Mode: III Power Setting: 3.4 mJ/burst Number of shots: 24 Total energy delivered: 78.84 mJ   The patient tolerated the procedure without difficulty. No complications were encountered.   The patient was discharged home with the instructions to continue all her current glaucoma medications, if any.   Patient instructed to go to office at 0100 for intraocular pressure check.  Patient verbalizes understanding of discharge instructions Yes.  .    Pre-Operative Diagnosis: After-Cataract, obscuring vision, 366.53 Right Post-Operative Diagnosis: After-Cataract, obscuring vision, 366.53 Right Date of Cataract Surgery: 09/17/2008

## 2016-04-19 NOTE — Discharge Instructions (Signed)
[name]  04/19/2016     Instructions    Activity: No Restrictions.   Diet: Resume Diet you were on at home.   Pain Medication: Tylenol if Needed.   CONTACT YOUR DOCTOR IF YOU HAVE PAIN, REDNESS IN YOUR EYE, OR DECREASED VISION.   Follow-up: with [name], MD.   Dr. Gershon Crane: [phone]  Dr. Iona HansenJI:[id number]  Dr. Geoffry ParadiseID:[id number]   If you find that you cannot contact your physician, but feel that your signs and   Symptoms warrant a physician's attention, call the Emergency Room at   [phone] [phone].   Other Office visit 1:00

## 2016-04-19 NOTE — H&P (Signed)
The patient was re examined and there is no change in the patients condition since the original H and P. 

## 2016-04-20 ENCOUNTER — Encounter (HOSPITAL_COMMUNITY): Payer: Self-pay | Admitting: Ophthalmology

## 2016-05-03 ENCOUNTER — Ambulatory Visit (INDEPENDENT_AMBULATORY_CARE_PROVIDER_SITE_OTHER): Payer: Medicare Other | Admitting: Podiatry

## 2016-05-03 ENCOUNTER — Encounter: Payer: Self-pay | Admitting: Podiatry

## 2016-05-03 VITALS — BP 148/84 | HR 67 | Resp 16

## 2016-05-03 DIAGNOSIS — G629 Polyneuropathy, unspecified: Secondary | ICD-10-CM

## 2016-05-03 DIAGNOSIS — M79676 Pain in unspecified toe(s): Secondary | ICD-10-CM

## 2016-05-03 DIAGNOSIS — B351 Tinea unguium: Secondary | ICD-10-CM

## 2016-05-03 MED ORDER — NONFORMULARY OR COMPOUNDED ITEM: Status: DC

## 2016-05-03 NOTE — Progress Notes (Signed)
   Subjective:    Patient ID: [name], male    DOB: December 10, 1918, 82 y.o.   MRN: [id number]  HPI: He presents today with his son with a chief complaint of painful toes and feet bilaterally. He states that not only are the toenails painful and a dry scan but I also have pain that radiates up her legs and he really just bothers me sometimes as he begins to cry. His son goes on to say that this feet really bother him particularly across the top and up the leg as well as the rash on the side of the foot for which she is seeing Dr. Ubaldo Glassing. He has had a history of peripheral vascular disease which resulted in a infection to the distal aspect of the left toe resulting in amputation.    Review of Systems  Cardiovascular: Positive for leg swelling.  Musculoskeletal: Positive for arthralgias and gait problem.  Skin: Positive for color change, rash and wound.  All other systems reviewed and are negative.      Objective:   Physical Exam: Vital signs are stable he is alert and oriented 3 very pleasant elderly man in no apparent distress. Pulses are barely palpable bilateral capillary fill time is immediate. Neurologic sensorium is intact but slightly diminished. Deep tendon reflexes are intact bilateral muscle strength is 4 out of 5 bilateral. Orthopedic evaluation demonstrates hammertoe deformities bilateral otherwise joints appear to be normal. Cutaneous evaluation was a supple well-hydrated cutis for the majority of the feet however he does demonstrate what appears to be regions of eczema or guttate psoriasis. His toenails are thick yellow dystrophic onychomycotic. With exception of the hallux left which was amputated.        Assessment & Plan:  Pain in limb secondary to onychomycosis and neuropathy.  Plan: Debrided his nails today. I will also request a compounding agent for the pain. He walks with a walker and we are unable to provide him with gabapentin due to his balance issue.

## 2016-06-03 ENCOUNTER — Other Ambulatory Visit: Payer: Self-pay | Admitting: Family Medicine

## 2016-06-08 ENCOUNTER — Telehealth: Payer: Self-pay | Admitting: *Deleted

## 2016-06-08 NOTE — Telephone Encounter (Addendum)
[name] states pt is having a lot of nerve pain, can he use Lyrica or Gabapentin? I spoke with [name] she states pt says his feet hurt so much he was up all night rubbing with the peripheral neuropathy cream without relief.  [name] states she has Gabapentin she can give him, and I told her I would not recommend that without him being evaluated for it 1st. I asked if pt's feet and toes were warm and with quick return to color and she states every time his circulation was checked they said it was good. [name] asked if she could just get him some pain pills. I offered her an appt either with Dr. Cannon Kettle or [name] tomorrow, and transferred to schedulers.  [name] states pt is scheduled with [name] tomorrow in [place] at 415pm. 06/10/2016-DrJacqualyn Rodriguez ordered home health PT for gait training, balance, strength and fall risk and home safety for neuropathy.  [facility] states they have availability for pt, fax orders.

## 2016-06-09 ENCOUNTER — Ambulatory Visit (INDEPENDENT_AMBULATORY_CARE_PROVIDER_SITE_OTHER): Payer: Medicare Other | Admitting: Podiatry

## 2016-06-09 ENCOUNTER — Encounter: Payer: Self-pay | Admitting: Podiatry

## 2016-06-09 ENCOUNTER — Encounter (INDEPENDENT_AMBULATORY_CARE_PROVIDER_SITE_OTHER): Payer: Self-pay

## 2016-06-09 VITALS — BP 159/96 | HR 69 | Resp 12

## 2016-06-09 DIAGNOSIS — R262 Difficulty in walking, not elsewhere classified: Secondary | ICD-10-CM

## 2016-06-09 DIAGNOSIS — L84 Corns and callosities: Secondary | ICD-10-CM

## 2016-06-09 DIAGNOSIS — G629 Polyneuropathy, unspecified: Secondary | ICD-10-CM

## 2016-06-09 DIAGNOSIS — R2681 Unsteadiness on feet: Secondary | ICD-10-CM

## 2016-06-09 MED ORDER — DICLOFENAC SODIUM 75 MG PO TBEC: 75.0000 mg | DELAYED_RELEASE_TABLET | Freq: Two times a day (BID) | ORAL | Status: DC

## 2016-06-10 NOTE — Telephone Encounter (Addendum)
-----   Message from Trula Slade, DPM sent at 06/09/2016  5:00 PM EDT ----- Can you order HOME PT for him for gait training and falls and neuropathy. 06/13/2016-Faxed [facility] requested form, pt clinicals and demographics.

## 2016-06-12 ENCOUNTER — Encounter: Payer: Self-pay | Admitting: Podiatry

## 2016-06-12 NOTE — Progress Notes (Signed)
Patient ID: TOWAN RAMSDELL, male   DOB: September 13, 1919, 82 y.o.   MRN: [id number]  Subjective: 82 year old male presents the office they with his daughter for concerns of neuropathy and continued pain to his feet particularly at night he is having difficulty sleeping. He states that he didn't get the compound cream that was ordered however this has not been helping. There are asking about any other medication as he continues to help with the burning pain that he is experiencing. Per his daughter he is also become less active as he was carrying for his wife and not leaving the house and he has not been very active. She is concerned about falls. He has had to rely on the walker for gait. Denies any systemic complaints such as fevers, chills, nausea, vomiting. No acute changes since last appointment, and no other complaints at this time.   Objective: AAO x3, NAD DP/PT pulses palpable bilaterally, CRT less than 3 seconds Subjective there is burning pain to his toes to his feet. There is no specific area pinpoint bony tenderness or pain the vibratory sensation. There is no edema, erythema. There does appear to be scattered thick hyperkeratotic lesions to both feet. There is no edema, erythema. No areas of pinpoint bony tenderness or pain with vibratory sensation. MMT 5/5, ROM WNL. No edema, erythema, increase in warmth to bilateral lower extremities.  No open lesions or pre-ulcerative lesions.  No pain with calf compression, swelling, warmth, erythema  Assessment: Symptomatic neuropathy; hyperkeratotic lesions  Plan: -All treatment options discussed with the patient including all alternatives, risks, complications.  -Lesions were sharply debrided. -Continue compound cream for neuropathy. Prescribed voltaren to this as well. Discussed side effects of using anti-inflammatory with his age and the patient's daughter understands this however due to his pain over this will help. Now open on gabapentin side effects  or narcotics. -Given his gait instability Will order home physical therapy to help increase his stability and gait to hopefully help prevent falls.  -Follow-up as scheduled or sooner if needed. -Patient encouraged to call the office with any questions, concerns, change in symptoms.   Celesta Gentile, DPM

## 2016-06-16 ENCOUNTER — Ambulatory Visit: Payer: Medicare Other | Admitting: Podiatry

## 2016-06-16 ENCOUNTER — Ambulatory Visit (INDEPENDENT_AMBULATORY_CARE_PROVIDER_SITE_OTHER): Payer: Medicare Other | Admitting: Podiatry

## 2016-06-16 ENCOUNTER — Encounter: Payer: Self-pay | Admitting: Podiatry

## 2016-06-16 DIAGNOSIS — G629 Polyneuropathy, unspecified: Secondary | ICD-10-CM | POA: Diagnosis not present

## 2016-06-16 MED ORDER — GABAPENTIN 100 MG PO CAPS: 100.0000 mg | ORAL_CAPSULE | Freq: Two times a day (BID) | ORAL | Status: DC

## 2016-06-18 DIAGNOSIS — G629 Polyneuropathy, unspecified: Secondary | ICD-10-CM | POA: Diagnosis not present

## 2016-06-18 DIAGNOSIS — R262 Difficulty in walking, not elsewhere classified: Secondary | ICD-10-CM | POA: Diagnosis not present

## 2016-06-18 DIAGNOSIS — L84 Corns and callosities: Secondary | ICD-10-CM | POA: Diagnosis not present

## 2016-06-18 DIAGNOSIS — Z7982 Long term (current) use of aspirin: Secondary | ICD-10-CM | POA: Diagnosis not present

## 2016-06-18 NOTE — Progress Notes (Signed)
He presents today with his daughter for discussion about his painful feet. He is using a topical anti-inflammatory and pain medication however does not seem to be helping.  Objective: Vital signs are stable alert and oriented 3. Pulses are palpable no change in physical findings.  Assessment: Neuropathy.  Plan: His daughter is requesting oral gabapentin. I did express to her that I was concerned about his balance and the risk of fall. She understands this is amenable to it and because of the pain is willing to take the risk. The patient is also willing to take the risk and understands this fully I will follow-up with him in 1 month to reassess medication.

## 2016-06-21 ENCOUNTER — Telehealth: Payer: Self-pay | Admitting: *Deleted

## 2016-06-21 DIAGNOSIS — R262 Difficulty in walking, not elsewhere classified: Secondary | ICD-10-CM | POA: Diagnosis not present

## 2016-06-21 DIAGNOSIS — Z7982 Long term (current) use of aspirin: Secondary | ICD-10-CM | POA: Diagnosis not present

## 2016-06-21 DIAGNOSIS — G629 Polyneuropathy, unspecified: Secondary | ICD-10-CM | POA: Diagnosis not present

## 2016-06-21 DIAGNOSIS — L84 Corns and callosities: Secondary | ICD-10-CM | POA: Diagnosis not present

## 2016-06-21 NOTE — Telephone Encounter (Addendum)
Debbie - PT with [facility] request verbal orders for PT 1x wk for 1 wk, then 2x wk for 2 wks and evaluation, also request nurse management for foot care. Debbie - [facility] states pt has an area of maceration between right 1st and 2nd toe and plantar foot, it appears they are putting the socks on too quickly after his peripheral neuropathy cream. Jackelyn Poling states she would like a verbal order to send a nurse to access the area and call back for wound care orders.  I gave verbal order to have nurse access. 06/24/2016-Joanna - [facility] states the Wound Care Nurse states pt has a neuropathic ulcer to right plantar 1st MPJ pink, open and moist, and yeast between toes of both feetdue to edema and shoes being too tight. [facility] recommends for right 1st MPJ clean area with saline apply moisture barrier then Calcium AG 2xweek and a offloading shoe, the 1-10 toes cleanse with soap and water use [facility] antifungal ointment - Secura or physician preferred antifungal, nursing visits 2x week for 2 weeks and discharge 3rd week.I told Di Kindle I would ask the doctor in office today to advise. I told Di Kindle, in the last to office visits I did not see mention of ulcer to right foot, I would have pt's family make an appt to be evaluated and also the peripheral neuropathy cream did not appear to help as mentioned in the clinicals, I would have the pt stop and it may help with some of the maceration. I spoke with [facility] and she said the wound was on the bottom of his foot at the last visit it is looking better now, pt is using a hair dryer to dry between his toes and the ulcer is drying too, he has been out of the compression hose for 3 days to allow for drying. I told Becky to stop the Peripheral Neuropathy cream, continue to allow pt to air dry feet and elevate for comfort and edema, and I would call with information concerning [facility]. WOUND CARE ORDERS - Dr. Jacqualyn Posey states clean with  Saline, Calcium AG to right 1st MPJ with wound barrier and dressing, 1-10 toes may use antifungal "Secura", nursing visits as Di Kindle recommended, continue to elevate and air dry, stop peripheral neuropathy cream and make an appt to be seen. Orders to [facility]. Orders to [facility] and she states Dr. Milinda Pointer had seen the wound on 06/16/2016 and stated it was okay to wait until 08/04/2016 appt to be seen again.  I told [name] if that was what Dr. Milinda Pointer said okay, but to make an appt if there were problems.

## 2016-06-22 DIAGNOSIS — R262 Difficulty in walking, not elsewhere classified: Secondary | ICD-10-CM | POA: Diagnosis not present

## 2016-06-23 DIAGNOSIS — R262 Difficulty in walking, not elsewhere classified: Secondary | ICD-10-CM | POA: Diagnosis not present

## 2016-06-23 DIAGNOSIS — G629 Polyneuropathy, unspecified: Secondary | ICD-10-CM | POA: Diagnosis not present

## 2016-06-23 DIAGNOSIS — L84 Corns and callosities: Secondary | ICD-10-CM | POA: Diagnosis not present

## 2016-06-23 DIAGNOSIS — Z7982 Long term (current) use of aspirin: Secondary | ICD-10-CM | POA: Diagnosis not present

## 2016-06-24 DIAGNOSIS — Z7982 Long term (current) use of aspirin: Secondary | ICD-10-CM | POA: Diagnosis not present

## 2016-06-24 DIAGNOSIS — L84 Corns and callosities: Secondary | ICD-10-CM | POA: Diagnosis not present

## 2016-06-24 DIAGNOSIS — G629 Polyneuropathy, unspecified: Secondary | ICD-10-CM | POA: Diagnosis not present

## 2016-06-24 DIAGNOSIS — R262 Difficulty in walking, not elsewhere classified: Secondary | ICD-10-CM | POA: Diagnosis not present

## 2016-06-25 DIAGNOSIS — L84 Corns and callosities: Secondary | ICD-10-CM | POA: Diagnosis not present

## 2016-06-25 DIAGNOSIS — Z7982 Long term (current) use of aspirin: Secondary | ICD-10-CM | POA: Diagnosis not present

## 2016-06-25 DIAGNOSIS — R262 Difficulty in walking, not elsewhere classified: Secondary | ICD-10-CM | POA: Diagnosis not present

## 2016-06-25 DIAGNOSIS — G629 Polyneuropathy, unspecified: Secondary | ICD-10-CM | POA: Diagnosis not present

## 2016-06-27 DIAGNOSIS — Z7982 Long term (current) use of aspirin: Secondary | ICD-10-CM | POA: Diagnosis not present

## 2016-06-27 DIAGNOSIS — L84 Corns and callosities: Secondary | ICD-10-CM | POA: Diagnosis not present

## 2016-06-27 DIAGNOSIS — R262 Difficulty in walking, not elsewhere classified: Secondary | ICD-10-CM | POA: Diagnosis not present

## 2016-06-27 DIAGNOSIS — G629 Polyneuropathy, unspecified: Secondary | ICD-10-CM | POA: Diagnosis not present

## 2016-06-28 DIAGNOSIS — G629 Polyneuropathy, unspecified: Secondary | ICD-10-CM | POA: Diagnosis not present

## 2016-06-28 DIAGNOSIS — L84 Corns and callosities: Secondary | ICD-10-CM | POA: Diagnosis not present

## 2016-06-28 DIAGNOSIS — R262 Difficulty in walking, not elsewhere classified: Secondary | ICD-10-CM | POA: Diagnosis not present

## 2016-06-28 DIAGNOSIS — Z7982 Long term (current) use of aspirin: Secondary | ICD-10-CM | POA: Diagnosis not present

## 2016-06-29 DIAGNOSIS — R262 Difficulty in walking, not elsewhere classified: Secondary | ICD-10-CM | POA: Diagnosis not present

## 2016-06-29 DIAGNOSIS — G629 Polyneuropathy, unspecified: Secondary | ICD-10-CM | POA: Diagnosis not present

## 2016-06-29 DIAGNOSIS — Z7982 Long term (current) use of aspirin: Secondary | ICD-10-CM | POA: Diagnosis not present

## 2016-06-29 DIAGNOSIS — L84 Corns and callosities: Secondary | ICD-10-CM | POA: Diagnosis not present

## 2016-06-30 DIAGNOSIS — G629 Polyneuropathy, unspecified: Secondary | ICD-10-CM | POA: Diagnosis not present

## 2016-07-01 DIAGNOSIS — G629 Polyneuropathy, unspecified: Secondary | ICD-10-CM | POA: Diagnosis not present

## 2016-07-01 DIAGNOSIS — R262 Difficulty in walking, not elsewhere classified: Secondary | ICD-10-CM | POA: Diagnosis not present

## 2016-07-01 DIAGNOSIS — Z7982 Long term (current) use of aspirin: Secondary | ICD-10-CM | POA: Diagnosis not present

## 2016-07-01 DIAGNOSIS — L84 Corns and callosities: Secondary | ICD-10-CM | POA: Diagnosis not present

## 2016-07-04 ENCOUNTER — Telehealth: Payer: Self-pay | Admitting: Family Medicine

## 2016-07-04 DIAGNOSIS — R262 Difficulty in walking, not elsewhere classified: Secondary | ICD-10-CM | POA: Diagnosis not present

## 2016-07-04 DIAGNOSIS — L84 Corns and callosities: Secondary | ICD-10-CM | POA: Diagnosis not present

## 2016-07-04 DIAGNOSIS — G629 Polyneuropathy, unspecified: Secondary | ICD-10-CM | POA: Diagnosis not present

## 2016-07-04 DIAGNOSIS — Z7982 Long term (current) use of aspirin: Secondary | ICD-10-CM | POA: Diagnosis not present

## 2016-07-04 NOTE — Telephone Encounter (Signed)
noted 

## 2016-07-04 NOTE — Telephone Encounter (Signed)
Received call from PT and states that the pt fell Sat and hurt his back and arm - pt did not seek medical attention and states that he is sore today but feeling better and does not think he needs to see a MD - she was calling just for [facility]

## 2016-07-07 DIAGNOSIS — Z7982 Long term (current) use of aspirin: Secondary | ICD-10-CM | POA: Diagnosis not present

## 2016-07-07 DIAGNOSIS — R262 Difficulty in walking, not elsewhere classified: Secondary | ICD-10-CM | POA: Diagnosis not present

## 2016-07-07 DIAGNOSIS — L84 Corns and callosities: Secondary | ICD-10-CM | POA: Diagnosis not present

## 2016-07-07 DIAGNOSIS — G629 Polyneuropathy, unspecified: Secondary | ICD-10-CM | POA: Diagnosis not present

## 2016-07-21 ENCOUNTER — Ambulatory Visit (INDEPENDENT_AMBULATORY_CARE_PROVIDER_SITE_OTHER): Payer: Medicare Other | Admitting: Family Medicine

## 2016-07-21 ENCOUNTER — Encounter: Payer: Self-pay | Admitting: Family Medicine

## 2016-07-21 VITALS — BP 126/64 | HR 64 | Temp 97.3°F | Resp 16 | Ht 73.0 in

## 2016-07-21 DIAGNOSIS — I739 Peripheral vascular disease, unspecified: Secondary | ICD-10-CM

## 2016-07-21 DIAGNOSIS — G629 Polyneuropathy, unspecified: Secondary | ICD-10-CM

## 2016-07-21 DIAGNOSIS — L723 Sebaceous cyst: Secondary | ICD-10-CM

## 2016-07-21 NOTE — Progress Notes (Signed)
Subjective:    Patient ID: [name], male    DOB: 17-Oct-1919, 82 y.o.   MRN: [id number]  HPI Patient complains of severe shooting pains in both feet left greater than right. Review of his chart reveals vascular ultrasound obtained of the legs in July 2016 which revealed ABIs of 90% in the ankles, but TBI of  50%.  Examination of his feet today reveal swollen pink erythematous toes covered with yellow scale bilaterally particularly the third fourth and fifth toes bilaterally. These appear to be changes consistent with arterial insufficiency coupled with some of the neuropathic pain that he is experiencing radiating from his back and his legs into his toes. The pain comes and goes. He will be fine for a week and then he'll have severe pain for a day or 2. There is no evidence of cellulitis today. He is currently on gabapentin 100 mg by mouth twice a day for pain which has not helped but does not cause any drowsiness or dizziness. He also has an inflamed red painful cyst on the center of his back approximately T7. Is approximately 2 cm in diameter and is consistent with an inflamed sebaceous cyst Past Medical History:  Diagnosis Date  . Acid reflux   . Cancer ([place]) 09/05/13   left neck-non-hodgkins lymphoma  . Hypertension   . Hypertension    requires diuretic  . Hypothyroidism   . Lymphadenopathy, cervical 08/29/2013  . Malaria    while in the service  . Peripheral vascular disease ([place])    slow wound healing on legs   Past Surgical History:  Procedure Laterality Date  . AMPUTATION Left 06/25/2015   Procedure: LEFT  HALLUX AMPUTATION,;  Surgeon: Wylene Simmer, MD;  Location: [facility];  Service: Orthopedics;  Laterality: Left;  LOCAL/MAC  . MASS BIOPSY Left 09/05/2013   Procedure: EXCISIONAL BIOPSY OF LEFT NECK MASS;  Surgeon: Jerrell Belfast, MD;  Location: [facility];  Service: ENT;  Laterality: Left;  . TONSILLECTOMY    . YAG LASER APPLICATION Right [id number]   Procedure:  YAG LASER APPLICATION;  Surgeon: [name], MD;  Location: [facility];  Service: Ophthalmology;  Laterality: Right;   Current Outpatient Prescriptions on File Prior to Visit  Medication Sig Dispense Refill  . acetaminophen (TYLENOL) 325 MG tablet Take 325 mg by mouth every 6 (six) hours as needed for pain.     Marland Kitchen aspirin EC 81 MG tablet Take 1 tablet (81 mg total) by mouth daily.    . diclofenac (VOLTAREN) 75 MG EC tablet Take 1 tablet (75 mg total) by mouth 2 (two) times daily. 30 tablet 0  . enalapril (VASOTEC) 20 MG tablet Take 1 tablet by mouth  every day 90 tablet 1  . furosemide (LASIX) 20 MG tablet Take 1 tablet by mouth  every day 90 tablet 1  . gabapentin (NEURONTIN) 100 MG capsule Take 1 capsule (100 mg total) by mouth 2 (two) times daily. 180 capsule 3  . levothyroxine (SYNTHROID, LEVOTHROID) 88 MCG tablet Take 1 tablet by mouth  every day 90 tablet 1  . meclizine (ANTIVERT) 25 MG tablet Take 1 tablet (25 mg total) by mouth 3 (three) times daily as needed for dizziness. 30 tablet 0  . NONFORMULARY OR COMPOUNDED ITEM [facility]:  Peripheral Neuropathy cream - Bupivacaine 1%, Doxepin 3%, Gabapentin 6%, Pentoxifylline 3%, Topiramate 1%, apply 1-2 grams to affected area 3-4 times daily. 120 each 11  . omeprazole (PRILOSEC) 20 MG capsule Take 20  mg by mouth daily as needed (heartburn).     . triamcinolone cream (KENALOG) 0.1 % APPLY TO AFFECTED AREA TWICE A DAY FOR 2-4 WEEKS AS NEEDED FOR FLARES  1  . vitamin B-12 (CYANOCOBALAMIN) 1000 MCG tablet Take 1 tablet (1,000 mcg total) by mouth daily. 30 tablet 5  . temazepam (RESTORIL) 15 MG capsule Take 1 capsule (15 mg total) by mouth at bedtime as needed for sleep. (Patient not taking: Reported on 04/04/2016) 30 capsule 0  . [DISCONTINUED] famotidine (PEPCID) 20 MG tablet Take 20 mg by mouth 2 (two) times daily.     No current facility-administered medications on file prior to visit.    Allergies  Allergen Reactions  . Codeine Nausea And  Vomiting  . Hydrocodone Nausea And Vomiting  . Percocet [Oxycodone-Acetaminophen] Nausea And Vomiting   Social History   Social History  . Marital status: Married    Spouse name: N/A  . Number of children: 4  . Years of education: N/A   Occupational History  . Not on file.   Social History Main Topics  . Smoking status: Never Smoker  . Smokeless tobacco: Former Systems developer    Quit date: 10/05/1979  . Alcohol use No  . Drug use: No  . Sexual activity: Not on file   Other Topics Concern  . Not on file   Social History Narrative  . No narrative on file      Review of Systems  All other systems reviewed and are negative.      Objective:   Physical Exam  Cardiovascular: Normal rate, regular rhythm and normal heart sounds.   Pulmonary/Chest: Effort normal and breath sounds normal.  Skin: Skin is warm. There is erythema.  Vitals reviewed.  Patient has very weak pulses in both feet at the dorsalis pedis. See the history of present illness for his description.       Assessment & Plan:  Inflamed sebaceous cyst  Neuropathy (HCC)  PVD (peripheral vascular disease) ([place])  I believe the pain in his feet is secondary to peripheral vascular disease in the feet coupled with neuropathy in the feet. Increase gabapentin to 300 mg 3 times a day. If insufficient consider Pletal for peripheral vascular disease. Meanwhile, I anesthetized the inflamed cyst in the center of his back was 0.1% lidocaine. I made a 1 cm vertical incision and expressed copious cyst sac contents and I was also able to remove the majority of the cyst sac through the opening. This was then packed with 3 inches of gauze and wound care was discussed

## 2016-08-04 ENCOUNTER — Ambulatory Visit (INDEPENDENT_AMBULATORY_CARE_PROVIDER_SITE_OTHER): Payer: Medicare Other | Admitting: Podiatry

## 2016-08-04 ENCOUNTER — Encounter: Payer: Self-pay | Admitting: Podiatry

## 2016-08-04 DIAGNOSIS — G629 Polyneuropathy, unspecified: Secondary | ICD-10-CM

## 2016-08-04 DIAGNOSIS — B351 Tinea unguium: Secondary | ICD-10-CM | POA: Diagnosis not present

## 2016-08-04 DIAGNOSIS — M79676 Pain in unspecified toe(s): Secondary | ICD-10-CM

## 2016-08-04 NOTE — Progress Notes (Signed)
He presents today for a chief complaint of neuropathy and painful elongated toenails and dry scaly skin.  Objective: Vital signs are stable he is alert and oriented 3 pulses are palpable. He has dry scaly skin in his nails are thick yellow dystrophic onychomycotic. Some interdigital maceration is also noted.  Assessment: Onychomycosis and tinea pedis with xerosis.  Plan: I recommended soapy water soaks to help get rid of some of the dry flaky skin and then application of antifungal cream. Also debrided his nails 1 through 5 bilaterally today. I'll follow-up with him in a few weeks.

## 2016-08-05 DIAGNOSIS — B353 Tinea pedis: Secondary | ICD-10-CM | POA: Diagnosis not present

## 2016-08-05 DIAGNOSIS — Z79899 Other long term (current) drug therapy: Secondary | ICD-10-CM | POA: Diagnosis not present

## 2016-08-05 DIAGNOSIS — Z85828 Personal history of other malignant neoplasm of skin: Secondary | ICD-10-CM | POA: Diagnosis not present

## 2016-08-05 DIAGNOSIS — B351 Tinea unguium: Secondary | ICD-10-CM | POA: Diagnosis not present

## 2016-08-05 DIAGNOSIS — L239 Allergic contact dermatitis, unspecified cause: Secondary | ICD-10-CM | POA: Diagnosis not present

## 2016-09-02 ENCOUNTER — Ambulatory Visit: Payer: Medicare Other | Admitting: Family Medicine

## 2016-09-02 ENCOUNTER — Ambulatory Visit (INDEPENDENT_AMBULATORY_CARE_PROVIDER_SITE_OTHER): Payer: Medicare Other | Admitting: Family Medicine

## 2016-09-02 ENCOUNTER — Encounter: Payer: Self-pay | Admitting: Family Medicine

## 2016-09-02 DIAGNOSIS — B353 Tinea pedis: Secondary | ICD-10-CM | POA: Diagnosis not present

## 2016-09-02 DIAGNOSIS — C44622 Squamous cell carcinoma of skin of right upper limb, including shoulder: Secondary | ICD-10-CM | POA: Diagnosis not present

## 2016-09-02 DIAGNOSIS — C44629 Squamous cell carcinoma of skin of left upper limb, including shoulder: Secondary | ICD-10-CM | POA: Diagnosis not present

## 2016-09-02 DIAGNOSIS — Z79899 Other long term (current) drug therapy: Secondary | ICD-10-CM | POA: Diagnosis not present

## 2016-09-02 DIAGNOSIS — Z23 Encounter for immunization: Secondary | ICD-10-CM

## 2016-09-25 ENCOUNTER — Other Ambulatory Visit: Payer: Self-pay | Admitting: Family Medicine

## 2016-09-30 ENCOUNTER — Telehealth: Payer: Self-pay | Admitting: *Deleted

## 2016-09-30 ENCOUNTER — Telehealth: Payer: Self-pay | Admitting: Family Medicine

## 2016-09-30 MED ORDER — GABAPENTIN 100 MG PO CAPS: 100.0000 mg | ORAL_CAPSULE | Freq: Two times a day (BID) | ORAL | 3 refills | Status: DC

## 2016-09-30 NOTE — Telephone Encounter (Signed)
Patient calling to get refill on his gabapentin normally goes to mail order  They need it to go to [address] [facility]   Patient is almost out

## 2016-09-30 NOTE — Telephone Encounter (Signed)
Medication called/sent to requested pharmacy  

## 2016-09-30 NOTE — Telephone Encounter (Addendum)
Pt's dtr, [name] states pt is currently taking Gabapentin 100mg  two capsules 2 times day and his old rx is for one capsule 2 times a day and he needs refills. I reviewed Medications, pt's 05/03/2016 to 08/04/2016 office visits, there was not orders to increase the Gabapentin to two capsules 2 times a day. [name] states possibly his PCP increased the dosage. I told [name] I would inform Dr. Milinda Pointer of the change, and she should check with the PCP concerning the refill, and I would call if Dr.Hyatt stated he would prescribe for the increase. 10/03/2016-Informed pt's dtr, Becky of Dr. Stephenie Acres orders to increase Gabapentin. Becky [facility] and remaining 4refills be sent to [facility].

## 2016-10-03 MED ORDER — GABAPENTIN 100 MG PO CAPS: ORAL_CAPSULE | ORAL | 0 refills | Status: DC

## 2016-10-03 MED ORDER — GABAPENTIN 100 MG PO CAPS: ORAL_CAPSULE | ORAL | 3 refills | Status: DC

## 2016-10-03 NOTE — Telephone Encounter (Signed)
Ok to refill for 90 days  Gabapentin 100 take 2 by mouth twice daily. With 4 rf

## 2016-10-27 ENCOUNTER — Ambulatory Visit: Payer: Medicare Other | Admitting: Physician Assistant

## 2016-11-03 ENCOUNTER — Encounter: Payer: Self-pay | Admitting: Podiatry

## 2016-11-03 ENCOUNTER — Ambulatory Visit (INDEPENDENT_AMBULATORY_CARE_PROVIDER_SITE_OTHER): Payer: Medicare Other | Admitting: Podiatry

## 2016-11-03 DIAGNOSIS — G629 Polyneuropathy, unspecified: Secondary | ICD-10-CM

## 2016-11-03 DIAGNOSIS — B351 Tinea unguium: Secondary | ICD-10-CM | POA: Diagnosis not present

## 2016-11-03 DIAGNOSIS — M79676 Pain in unspecified toe(s): Secondary | ICD-10-CM

## 2016-11-03 MED ORDER — GABAPENTIN 100 MG PO CAPS: ORAL_CAPSULE | ORAL | 4 refills | Status: DC

## 2016-11-03 MED ORDER — GABAPENTIN 300 MG PO CAPS: 300.0000 mg | ORAL_CAPSULE | Freq: Every day | ORAL | 4 refills | Status: DC

## 2016-11-06 NOTE — Progress Notes (Signed)
He presents today with a chief complaint of painful elongated toenails and a history of diabetic peripheral neuropathy.  Objective: Pulses are strongly palpable. Neurologic sensorium is diminished per Semmes-Weinstein monofilament. Toenails are thick yellow dystrophic onychomycotic and painful.  Assessment: Pain in limb secondary to onychomycosis and diabetic peripheral neuropathy.  Plan: Trim his nails today due to pain and diabetes will follow up with him in 3 months. We refilled his gabapentin.

## 2016-11-10 DIAGNOSIS — Z85828 Personal history of other malignant neoplasm of skin: Secondary | ICD-10-CM | POA: Diagnosis not present

## 2016-11-10 DIAGNOSIS — L821 Other seborrheic keratosis: Secondary | ICD-10-CM | POA: Diagnosis not present

## 2016-11-10 DIAGNOSIS — B351 Tinea unguium: Secondary | ICD-10-CM | POA: Diagnosis not present

## 2016-12-28 ENCOUNTER — Encounter: Payer: Self-pay | Admitting: Family Medicine

## 2016-12-28 ENCOUNTER — Ambulatory Visit (INDEPENDENT_AMBULATORY_CARE_PROVIDER_SITE_OTHER): Payer: Medicare Other | Admitting: Family Medicine

## 2016-12-28 VITALS — BP 168/92 | HR 70 | Temp 97.9°F | Resp 18 | Ht 73.0 in | Wt 205.0 lb

## 2016-12-28 DIAGNOSIS — G6289 Other specified polyneuropathies: Secondary | ICD-10-CM | POA: Diagnosis not present

## 2016-12-28 DIAGNOSIS — I739 Peripheral vascular disease, unspecified: Secondary | ICD-10-CM | POA: Diagnosis not present

## 2016-12-28 DIAGNOSIS — S80821D Blister (nonthermal), right lower leg, subsequent encounter: Secondary | ICD-10-CM | POA: Diagnosis not present

## 2016-12-28 DIAGNOSIS — I809 Phlebitis and thrombophlebitis of unspecified site: Secondary | ICD-10-CM

## 2016-12-28 MED ORDER — TRAMADOL HCL 50 MG PO TABS: 50.0000 mg | ORAL_TABLET | Freq: Three times a day (TID) | ORAL | 0 refills | Status: DC | PRN

## 2016-12-28 MED ORDER — CEPHALEXIN 250 MG PO CAPS: 250.0000 mg | ORAL_CAPSULE | Freq: Two times a day (BID) | ORAL | 0 refills | Status: DC

## 2016-12-28 NOTE — Progress Notes (Signed)
   Subjective:    Patient ID: [name], male    DOB: [birth year], 83 y.o.   MRN: [id number]  Patient presents for R Inner Thigh Irritation (has blood vessel that has become inflamed- states that he ahs some pain and burning sensation) Patient here today with his son. He has been complaining of terrible leg and foot pain. His pain is in his right inner thigh he noticed some redness over her vein. States it is keeping him up at night. He has not had any fevers chills. He is also being followed by podiatry for diabetic peripheral neuropathy as well as onychomycoses. He is currently on gabapentin which they titrated but he is still not sleeping well because of his pain. He does wear compression hose on a regular basis. He also has peripheral vascular disease as well as chronic venous stasis   Review Of Systems:  GEN- denies fatigue, fever, weight loss,weakness, recent illness HEENT- denies eye drainage, change in vision, nasal discharge, CVS- denies chest pain, palpitations RESP- denies SOB, cough, wheeze ABD- denies N/V, change in stools, abd pain GU- denies dysuria, hematuria, dribbling, incontinence MSK-+ joint pain, muscle aches, injury Neuro- denies headache, dizziness, syncope, seizure activity       Objective:    BP (!) 168/92 (BP Location: Left Arm, Patient Position: Sitting, Cuff Size: Large)   Pulse 70   Temp 97.9 F (36.6 C) (Oral)   Resp 18   Ht 6\' 1"  (1.854 m)   Wt 205 lb (93 kg)   SpO2 93%   BMI 27.05 kg/m  GEN- NAD, alert and oriented x3 HEENT- PERRL, EOMI, non injected sclera, pink conjunctiva, MMM, oropharynx clear CVS- RRR, no murmur RESP-CTAB EXT- chronic edema , venous statsis changes LE, erythema of toes bilat thick toenails, TTP on soles  Right inner thick large varicose vein, blister with erythema surrouding, mild erythema over vein, Mild TTP, erythema with small abrasion to right knee, knee NT, no warmth, no effusion, decreased ROM  Pulses-  DP- not  palpable        Assessment & Plan:      Problem List Items Addressed This Visit    Peripheral vascular disease ([place])    Other Visit Diagnoses    Blister of right lower extremity, subsequent encounter    -  Primary   Infected blister on leg, but also signs of phlebilitis though no thrombous palpated, he can not take NSAIDS ,hopefully pain will improve with low dose keflex, ultram, warm compress RECHECK Monday   Phlebitis       Other polyneuropathy (HCC)       Neuropathy in setting of PVD, chronic edema, will try ultram at bedtime, to help with pain, he has tolerated before, continue gabapentin, compression hose      Note: This dictation was prepared with Dragon dictation along with smaller phrase technology. Any transcriptional errors that result from this process are unintentional.

## 2016-12-28 NOTE — Patient Instructions (Signed)
F/U Monday or Tuesday with Dr Dennard Schaumann

## 2016-12-30 ENCOUNTER — Telehealth: Payer: Self-pay | Admitting: Family Medicine

## 2016-12-30 NOTE — Telephone Encounter (Signed)
Noted  

## 2017-01-03 ENCOUNTER — Encounter: Payer: Self-pay | Admitting: Family Medicine

## 2017-01-03 ENCOUNTER — Ambulatory Visit (INDEPENDENT_AMBULATORY_CARE_PROVIDER_SITE_OTHER): Payer: Medicare Other | Admitting: Family Medicine

## 2017-01-03 VITALS — BP 144/80 | HR 72 | Temp 97.4°F | Resp 18 | Ht 73.0 in | Wt 206.0 lb

## 2017-01-03 DIAGNOSIS — I809 Phlebitis and thrombophlebitis of unspecified site: Secondary | ICD-10-CM

## 2017-01-03 DIAGNOSIS — S80821D Blister (nonthermal), right lower leg, subsequent encounter: Secondary | ICD-10-CM | POA: Diagnosis not present

## 2017-01-03 DIAGNOSIS — L2084 Intrinsic (allergic) eczema: Secondary | ICD-10-CM

## 2017-01-03 MED ORDER — MOMETASONE FUROATE 0.1 % EX OINT: TOPICAL_OINTMENT | Freq: Every day | CUTANEOUS | 0 refills | Status: DC

## 2017-01-03 NOTE — Progress Notes (Signed)
Subjective:    Patient ID: [name], male    DOB: May 15, 1919, 83 y.o.   MRN: [id number]  HPI Patient was seen last week by my partner, Dr. Buelah Manis. He was diagnosed with a blister and thrombophlebitis. He was placed on Keflex. He is here today for follow-up. On his right leg in the mid thigh is a large varicose vein. Directly overlying the varicose pain is a lesion about the size of a dime. There is a scab over this lesion at the present time. It appears to be a very superficial ulcer with the skin has broken down overlying the varicose vein similar to a venous stasis ulcer. At the present time there is no evidence of thrombophlebitis. There is no evidence of cellulitis. The lesion is not warm or painful. It appears to be drying up and healing slowly. He does however have a patch on his upper left thigh about the size of a baseball that appears to be atopic dermatitis. He has long-standing history of eczema. This is erythematous skin with white scale with indistinct borders linear striations due to scratching and lichenification. Past Medical History:  Diagnosis Date  . Acid reflux   . Cancer ([place]) 09/05/13   left neck-non-hodgkins lymphoma  . Hypertension   . Hypertension    requires diuretic  . Hypothyroidism   . Lymphadenopathy, cervical 08/29/2013  . Malaria    while in the service  . Peripheral vascular disease ([place])    slow wound healing on legs   Past Surgical History:  Procedure Laterality Date  . AMPUTATION Left 06/25/2015   Procedure: LEFT  HALLUX AMPUTATION,;  Surgeon: Wylene Simmer, MD;  Location: [facility];  Service: Orthopedics;  Laterality: Left;  LOCAL/MAC  . MASS BIOPSY Left 09/05/2013   Procedure: EXCISIONAL BIOPSY OF LEFT NECK MASS;  Surgeon: Jerrell Belfast, MD;  Location: [facility];  Service: ENT;  Laterality: Left;  . TONSILLECTOMY    . YAG LASER APPLICATION Right 6/97/9480   Procedure: YAG LASER APPLICATION;  Surgeon: [name], MD;  Location: [facility];  Service: Ophthalmology;  Laterality: Right;   Current Outpatient Prescriptions on File Prior to Visit  Medication Sig Dispense Refill  . acetaminophen (TYLENOL) 325 MG tablet Take 325 mg by mouth every 6 (six) hours as needed for pain.     Marland Kitchen aspirin EC 81 MG tablet Take 1 tablet (81 mg total) by mouth daily.    . cephALEXin (KEFLEX) 250 MG capsule Take 1 capsule (250 mg total) by mouth 2 (two) times daily. 10 capsule 0  . diclofenac (VOLTAREN) 75 MG EC tablet Take 1 tablet (75 mg total) by mouth 2 (two) times daily. 30 tablet 0  . enalapril (VASOTEC) 20 MG tablet TAKE 1 TABLET BY MOUTH  EVERY DAY 90 tablet 1  . furosemide (LASIX) 20 MG tablet TAKE 1 TABLET BY MOUTH  EVERY DAY 90 tablet 1  . gabapentin (NEURONTIN) 100 MG capsule Take 2 capsules every morning 180 capsule 4  . gabapentin (NEURONTIN) 300 MG capsule Take 1 capsule (300 mg total) by mouth at bedtime. 90 capsule 4  . levothyroxine (SYNTHROID, LEVOTHROID) 88 MCG tablet TAKE 1 TABLET BY MOUTH  EVERY DAY 90 tablet 1  . meclizine (ANTIVERT) 25 MG tablet Take 1 tablet (25 mg total) by mouth 3 (three) times daily as needed for dizziness. 30 tablet 0  . NONFORMULARY OR COMPOUNDED [place]:  Peripheral Neuropathy cream - Bupivacaine 1%, Doxepin 3%, Gabapentin 6%, Pentoxifylline  3%, Topiramate 1%, apply 1-2 grams to affected area 3-4 times daily. 120 each 11  . omeprazole (PRILOSEC) 20 MG capsule Take 20 mg by mouth daily as needed (heartburn).     . temazepam (RESTORIL) 15 MG capsule Take 1 capsule (15 mg total) by mouth at bedtime as needed for sleep. 30 capsule 0  . traMADol (ULTRAM) 50 MG tablet Take 1 tablet (50 mg total) by mouth every 8 (eight) hours as needed. 30 tablet 0  . triamcinolone cream (KENALOG) 0.1 % APPLY TO AFFECTED AREA TWICE A DAY FOR 2-4 WEEKS AS NEEDED FOR FLARES  1  . vitamin B-12 (CYANOCOBALAMIN) 1000 MCG tablet Take 1 tablet (1,000 mcg total) by mouth daily. 30 tablet 5  . [DISCONTINUED] famotidine  (PEPCID) 20 MG tablet Take 20 mg by mouth 2 (two) times daily.     No current facility-administered medications on file prior to visit.    Allergies  Allergen Reactions  . Codeine Nausea And Vomiting  . Hydrocodone Nausea And Vomiting  . Percocet [Oxycodone-Acetaminophen] Nausea And Vomiting   Social History   Social History  . Marital status: Married    Spouse name: N/A  . Number of children: 4  . Years of education: N/A   Occupational History  . Not on file.   Social History Main Topics  . Smoking status: Never Smoker  . Smokeless tobacco: Former Systems developer    Quit date: 10/05/1979  . Alcohol use No  . Drug use: No  . Sexual activity: Not on file   Other Topics Concern  . Not on file   Social History Narrative  . No narrative on file      Review of Systems  All other systems reviewed and are negative.      Objective:   Physical Exam  Cardiovascular: Normal rate, regular rhythm and normal heart sounds.   Pulmonary/Chest: Effort normal and breath sounds normal.  Musculoskeletal: He exhibits edema.  Skin: Rash noted. No erythema.  Vitals reviewed.  See HPI       Assessment & Plan:  Blister of right lower extremity, subsequent encounter  Phlebitis  Intrinsic atopic dermatitis  The patient's phlebitis and cellulitis has resolved. There is a lesion the size of a dime that appears to be drying up and scabbing over on his right middle thigh. I recommended that he apply Polysporin to this area every day and cover with a Band-Aid. I anticipate that this should gradually heal over the next 5-7 days. Recheck if persistent for more than a week or if worsening. Patch of skin on his upper left thigh appears to be atopic dermatitis which is a chronic condition for this patient. I recommended treating this with Elocon ointment applied once daily for 7 days.

## 2017-01-16 ENCOUNTER — Ambulatory Visit: Payer: Medicare Other | Admitting: Physician Assistant

## 2017-01-16 ENCOUNTER — Encounter: Payer: Self-pay | Admitting: Podiatry

## 2017-01-16 ENCOUNTER — Ambulatory Visit (INDEPENDENT_AMBULATORY_CARE_PROVIDER_SITE_OTHER): Payer: Medicare Other | Admitting: Podiatry

## 2017-01-16 ENCOUNTER — Ambulatory Visit (INDEPENDENT_AMBULATORY_CARE_PROVIDER_SITE_OTHER): Payer: Medicare Other | Admitting: Physician Assistant

## 2017-01-16 ENCOUNTER — Encounter: Payer: Self-pay | Admitting: Physician Assistant

## 2017-01-16 VITALS — Temp 97.9°F | Resp 18

## 2017-01-16 VITALS — BP 160/118 | HR 67 | Resp 16 | Wt 200.8 lb

## 2017-01-16 DIAGNOSIS — I872 Venous insufficiency (chronic) (peripheral): Secondary | ICD-10-CM | POA: Diagnosis not present

## 2017-01-16 DIAGNOSIS — L03116 Cellulitis of left lower limb: Secondary | ICD-10-CM

## 2017-01-16 DIAGNOSIS — L988 Other specified disorders of the skin and subcutaneous tissue: Secondary | ICD-10-CM | POA: Diagnosis not present

## 2017-01-16 DIAGNOSIS — I739 Peripheral vascular disease, unspecified: Secondary | ICD-10-CM

## 2017-01-16 DIAGNOSIS — S91109A Unspecified open wound of unspecified toe(s) without damage to nail, initial encounter: Secondary | ICD-10-CM | POA: Diagnosis not present

## 2017-01-16 MED ORDER — CEPHALEXIN 500 MG PO CAPS: 500.0000 mg | ORAL_CAPSULE | Freq: Three times a day (TID) | ORAL | 2 refills | Status: DC

## 2017-01-16 NOTE — Progress Notes (Signed)
   Subjective:    Patient ID: [name], male    DOB: 06/08/1919, 83 y.o.   MRN: [id number]  HPI  83 year-old male presents the office today for concerns of his left foot becoming red, swollen over the last 2 days. He did have some areas in between his toes and he was given a cream for athlete's foot. Since then he has noticed the foot is becoming more red and swollen and he presents today for further evaluation. His filling number who comes in today states that there was pus coming from a wound on the top of the left foot. Denies any red streaking. He denies any systemic complaints as fevers, chills, nausea, vomiting. No calf pain, chest pain, shows of breath.  Review of Systems  All other systems reviewed and are negative.      Objective:   Physical Exam General: AAO x3, NAD  Dermatological: There is interdigital maceration present all the interspace and the right foot as well as the first interspace and the right foot. On the dorsal aspect of the right first interspace is erythema just proximal to the MPJ and this is localized. There is no increase in warmth or ascending cellulitis. There is no drainage pus on the right side there is no active bleeding. On the left side his digital maceration all interspaces. There is erythema from the bursa the third interspace just proximal to the MPJ. There is no ascending cellulitis. There is small evidence of dried blood in the dorsal second MPJ. There is no open sores identified there is no pus expressed today. Mild increase in warmth the lateral aspect of the foot.  Vascular: Dorsalis Pedis artery and Posterior Tibial artery pedal pulses are palpable bilateral with immedate capillary fill time. There is no pain with calf compression, swelling, warmth, erythema.   Neruologic: Sensation decreased with Derrel Nip monofilament.  Musculoskeletal:  No pain, crepitus, or limitation noted with foot and ankle range of motion bilateral. Muscular  strength 5/5 in all groups tested bilateral.  Gait: Unassisted, Nonantalgic.     Assessment & Plan:  83 year old male with left foot cellulitis, interdigital maceration -Treatment options discussed including all alternatives, risks, and complications -Etiology of symptoms were discussed -Keflex was prescribed today. -Betadine was applied interdigitally. He can continue this at home and should to do this. -Hold off on antifungal treatment at this time. -Monitor for any clinical signs or symptoms of infection and directed to call the office immediately should any occur or go to the ER. -RTC in 1 week or sooner if needed.   Celesta Gentile, DPM

## 2017-01-16 NOTE — Progress Notes (Signed)
Patient ID: [name] MRN: [id number], DOB: 1919-07-20, 83 y.o. Date of Encounter: 01/16/2017, 12:49 PM    Chief Complaint:  Chief Complaint  Patient presents with  . left foot pain cracked and bleeding     HPI: 83 y.o. year old male is here with his son.   I reviewed his office visit note from OV here 12/28/16. I also reviewed his visit note from OV here 01/03/17.  He has a history of diabetic peripheral neuropathy as well as onychomycosis. Also has peripheral vascular disease and chronic venous stasis. He also has a history of atopic dermatitis and eczema.  As well, he has been going to Podiatrist.  He had left hallux amputation by Dr. Wylene Simmer 06/25/2015.  Today patient and son state that they just noticed these current issues with his left foot this morning. Pt then adds that he did notice a "wet spot "on his sock yesterday.     Home Meds:   Outpatient Medications Prior to Visit  Medication Sig Dispense Refill  . acetaminophen (TYLENOL) 325 MG tablet Take 325 mg by mouth every 6 (six) hours as needed for pain.     Marland Kitchen aspirin EC 81 MG tablet Take 1 tablet (81 mg total) by mouth daily.    . diclofenac (VOLTAREN) 75 MG EC tablet Take 1 tablet (75 mg total) by mouth 2 (two) times daily. 30 tablet 0  . enalapril (VASOTEC) 20 MG tablet TAKE 1 TABLET BY MOUTH  EVERY DAY 90 tablet 1  . furosemide (LASIX) 20 MG tablet TAKE 1 TABLET BY MOUTH  EVERY DAY 90 tablet 1  . gabapentin (NEURONTIN) 100 MG capsule Take 2 capsules every morning 180 capsule 4  . gabapentin (NEURONTIN) 300 MG capsule Take 1 capsule (300 mg total) by mouth at bedtime. 90 capsule 4  . levothyroxine (SYNTHROID, LEVOTHROID) 88 MCG tablet TAKE 1 TABLET BY MOUTH  EVERY DAY 90 tablet 1  . meclizine (ANTIVERT) 25 MG tablet Take 1 tablet (25 mg total) by mouth 3 (three) times daily as needed for dizziness. 30 tablet 0  . mometasone (ELOCON) 0.1 % ointment Apply topically daily. 45 g 0  . NONFORMULARY OR COMPOUNDED  ITEM [facility]:  Peripheral Neuropathy cream - Bupivacaine 1%, Doxepin 3%, Gabapentin 6%, Pentoxifylline 3%, Topiramate 1%, apply 1-2 grams to affected area 3-4 times daily. 120 each 11  . omeprazole (PRILOSEC) 20 MG capsule Take 20 mg by mouth daily as needed (heartburn).     . temazepam (RESTORIL) 15 MG capsule Take 1 capsule (15 mg total) by mouth at bedtime as needed for sleep. 30 capsule 0  . traMADol (ULTRAM) 50 MG tablet Take 1 tablet (50 mg total) by mouth every 8 (eight) hours as needed. 30 tablet 0  . triamcinolone cream (KENALOG) 0.1 % APPLY TO AFFECTED AREA TWICE A DAY FOR 2-4 WEEKS AS NEEDED FOR FLARES  1  . vitamin B-12 (CYANOCOBALAMIN) 1000 MCG tablet Take 1 tablet (1,000 mcg total) by mouth daily. 30 tablet 5  . cephALEXin (KEFLEX) 250 MG capsule Take 1 capsule (250 mg total) by mouth 2 (two) times daily. (Patient not taking: Reported on 01/16/2017) 10 capsule 0   No facility-administered medications prior to visit.     Allergies:  Allergies  Allergen Reactions  . Codeine Nausea And Vomiting  . Hydrocodone Nausea And Vomiting  . Percocet [Oxycodone-Acetaminophen] Nausea And Vomiting      Review of Systems: See HPI for pertinent ROS. All other ROS negative.  Physical Exam: Blood pressure 150/92, pulse 67, resp. rate 16, weight 200 lb 12.8 oz (91.1 kg), SpO2 96 %., Body mass index is 26.49 kg/m. General:  Elderly WM. Appears in no acute distress. Neck: Supple. No thyromegaly. No lymphadenopathy. Lungs: Clear bilaterally to auscultation without wheezes, rales, or rhonchi. Breathing is unlabored. Heart: Regular rhythm. No murmurs, rubs, or gallops. Msk:  Strength and tone normal for age. Extremities/Skin:  Left Foot: There is diffuse bright pink erythema consistent with cellulitis on entire dorsum of foot except sparing the area towards the medial malleolus -- that area of skin appears more normal color. There is significant erythema of the toes. When I separate  and inspect between the toes--there is severe erythema and serous drainage present between the fifth and fourth toes, between the fourth and third toes, between the third and second toes. At the medial edge of the first toe there is a open crack in the skin-- it is not actively bleeding but it does have some blood present. Along the bases of all of the toes--on dorsal aspect--  there is serous drainage weeping out. Neuro: Alert and oriented X 3. Moves all extremities spontaneously. Gait is normal. CNII-XII grossly in tact. Psych:  Responds to questions appropriately with a normal affect.     ASSESSMENT AND PLAN:  83 y.o. year old male with  1. Peripheral vascular disease ([place])  2. Venous stasis dermatitis of both lower extremities  3. Open wound of toe, initial encounter  4. Cellulitis of left foot  Highly concerned he may be developing osteomyelitis. Was going to start antibiotics and schedule follow-up with podiatry, but our staff called the podiatry office and they're able to see him there today. Therefore, I will not send in any new medication-- and will defer treatment to podiatry. Patient's son was given time for appointment today at [place] [facility] and states he can/will get pt there for that appointment and voices understanding for need for f/u.   [address] [place], [place], [facility] 01/16/2017 12:49 PM

## 2017-01-17 ENCOUNTER — Inpatient Hospital Stay (HOSPITAL_COMMUNITY)
Admission: EM | Admit: 2017-01-17 | Discharge: 2017-01-20 | DRG: 603 | Disposition: A | Discharge status: Home Health | Payer: Medicare Other | Attending: Family Medicine | Admitting: Family Medicine

## 2017-01-17 ENCOUNTER — Emergency Department (HOSPITAL_COMMUNITY): Payer: Medicare Other

## 2017-01-17 ENCOUNTER — Encounter (HOSPITAL_COMMUNITY): Payer: Self-pay | Admitting: Emergency Medicine

## 2017-01-17 DIAGNOSIS — I872 Venous insufficiency (chronic) (peripheral): Secondary | ICD-10-CM | POA: Diagnosis present

## 2017-01-17 DIAGNOSIS — Z8572 Personal history of non-Hodgkin lymphomas: Secondary | ICD-10-CM | POA: Diagnosis not present

## 2017-01-17 DIAGNOSIS — I10 Essential (primary) hypertension: Secondary | ICD-10-CM | POA: Diagnosis not present

## 2017-01-17 DIAGNOSIS — L03116 Cellulitis of left lower limb: Principal | ICD-10-CM | POA: Diagnosis present

## 2017-01-17 DIAGNOSIS — Z79899 Other long term (current) drug therapy: Secondary | ICD-10-CM

## 2017-01-17 DIAGNOSIS — Z885 Allergy status to narcotic agent status: Secondary | ICD-10-CM

## 2017-01-17 DIAGNOSIS — B353 Tinea pedis: Secondary | ICD-10-CM | POA: Diagnosis not present

## 2017-01-17 DIAGNOSIS — G8929 Other chronic pain: Secondary | ICD-10-CM | POA: Diagnosis present

## 2017-01-17 DIAGNOSIS — M79672 Pain in left foot: Secondary | ICD-10-CM | POA: Diagnosis not present

## 2017-01-17 DIAGNOSIS — E039 Hypothyroidism, unspecified: Secondary | ICD-10-CM | POA: Diagnosis present

## 2017-01-17 DIAGNOSIS — Z89412 Acquired absence of left great toe: Secondary | ICD-10-CM

## 2017-01-17 DIAGNOSIS — Z7982 Long term (current) use of aspirin: Secondary | ICD-10-CM

## 2017-01-17 DIAGNOSIS — I739 Peripheral vascular disease, unspecified: Secondary | ICD-10-CM | POA: Diagnosis present

## 2017-01-17 DIAGNOSIS — S68012A Complete traumatic metacarpophalangeal amputation of left thumb, initial encounter: Secondary | ICD-10-CM | POA: Diagnosis not present

## 2017-01-17 DIAGNOSIS — S91109A Unspecified open wound of unspecified toe(s) without damage to nail, initial encounter: Secondary | ICD-10-CM | POA: Diagnosis not present

## 2017-01-17 DIAGNOSIS — R739 Hyperglycemia, unspecified: Secondary | ICD-10-CM | POA: Diagnosis present

## 2017-01-17 DIAGNOSIS — L97529 Non-pressure chronic ulcer of other part of left foot with unspecified severity: Secondary | ICD-10-CM | POA: Diagnosis present

## 2017-01-17 DIAGNOSIS — I998 Other disorder of circulatory system: Secondary | ICD-10-CM | POA: Diagnosis not present

## 2017-01-17 DIAGNOSIS — Z87891 Personal history of nicotine dependence: Secondary | ICD-10-CM | POA: Diagnosis not present

## 2017-01-17 DIAGNOSIS — K219 Gastro-esophageal reflux disease without esophagitis: Secondary | ICD-10-CM | POA: Diagnosis not present

## 2017-01-17 DIAGNOSIS — L0390 Cellulitis, unspecified: Secondary | ICD-10-CM | POA: Diagnosis present

## 2017-01-17 DIAGNOSIS — Z66 Do not resuscitate: Secondary | ICD-10-CM | POA: Diagnosis present

## 2017-01-17 LAB — CBC WITH DIFFERENTIAL/PLATELET
Basophils Absolute: 0 10*3/uL (ref 0.0–0.1)
Basophils Relative: 0 %
Eosinophils Absolute: 0.2 10*3/uL (ref 0.0–0.7)
Eosinophils Relative: 3 %
HCT: 42.8 % (ref 39.0–52.0)
Hemoglobin: 14.4 g/dL (ref 13.0–17.0)
Lymphocytes Relative: 14 %
Lymphs Abs: 0.7 10*3/uL (ref 0.7–4.0)
MCH: 31.1 pg (ref 26.0–34.0)
MCHC: 33.6 g/dL (ref 30.0–36.0)
MCV: 92.4 fL (ref 78.0–100.0)
Monocytes Absolute: 0.6 10*3/uL (ref 0.1–1.0)
Monocytes Relative: 12 %
Neutro Abs: 3.8 10*3/uL (ref 1.7–7.7)
Neutrophils Relative %: 71 %
Platelets: 153 10*3/uL (ref 150–400)
RBC: 4.63 MIL/uL (ref 4.22–5.81)
RDW: 13.8 % (ref 11.5–15.5)
WBC: 5.3 10*3/uL (ref 4.0–10.5)

## 2017-01-17 LAB — COMPREHENSIVE METABOLIC PANEL
ALT: 13 U/L — ABNORMAL LOW (ref 17–63)
AST: 19 U/L (ref 15–41)
Albumin: 4.3 g/dL (ref 3.5–5.0)
Alkaline Phosphatase: 59 U/L (ref 38–126)
Anion gap: 8 (ref 5–15)
BUN: 23 mg/dL — ABNORMAL HIGH (ref 6–20)
CO2: 28 mmol/L (ref 22–32)
Calcium: 9.5 mg/dL (ref 8.9–10.3)
Chloride: 104 mmol/L (ref 101–111)
Creatinine, Ser: 1.43 mg/dL — ABNORMAL HIGH (ref 0.61–1.24)
GFR calc Af Amer: 46 mL/min — ABNORMAL LOW (ref >60)
GFR calc non Af Amer: 40 mL/min — ABNORMAL LOW (ref >60)
Glucose, Bld: 124 mg/dL — ABNORMAL HIGH (ref 65–99)
Potassium: 4.7 mmol/L (ref 3.5–5.1)
Sodium: 140 mmol/L (ref 135–145)
Total Bilirubin: 1 mg/dL (ref 0.3–1.2)
Total Protein: 7.6 g/dL (ref 6.5–8.1)

## 2017-01-17 LAB — I-STAT CG4 LACTIC ACID, ED
Lactic Acid, Venous: 1.1 mmol/L (ref 0.5–1.9)
Lactic Acid, Venous: 2.38 mmol/L (ref 0.5–1.9)

## 2017-01-17 NOTE — ED Provider Notes (Signed)
[facility] DEPT Provider Note   CSN: [id number] Arrival date & time: 01/17/17  1826   By signing my name below, I, Delton Prairie, attest that this documentation has been prepared under the direction and in the presence of Xian Apostol, MD  Electronically Signed: Delton Prairie, ED Scribe. 01/17/17. 11:38 PM.   History   Chief Complaint Chief Complaint  Patient presents with  . Foot Pain  . possible foot infection    left   The history is provided by the patient. No language interpreter was used.  Foot Pain  This is a new problem. The current episode started 2 days ago (but has had chronic issues for 2 years acutely worse x 2 days). The problem occurs constantly. The problem has been gradually worsening. Pertinent negatives include no chest pain, no abdominal pain, no headaches and no shortness of breath. Nothing aggravates the symptoms. Nothing relieves the symptoms. Treatments tried: keflex. The treatment provided no relief.   HPI Comments:  [name] is a 83 y.o. male, with a hx of HTN, who presents to the Emergency Department complaining of worsening left foot pain with associated cracking onset 2 days. Pt has applied betadine with no significant relief. Pt was seen by Dr. Earleen Newport at [facility] and was placed on an Keflex which has provided no relief. He was also seen by PCP today and was advised to visit the ED. Pt denies any other associated symptoms. No other modifying factors noted. No other complaints noted.   Past Medical History:  Diagnosis Date  . Acid reflux   . Cancer ([place]) 09/05/13   left neck-non-hodgkins lymphoma  . Hypertension   . Hypertension    requires diuretic  . Hypothyroidism   . Lymphadenopathy, cervical 08/29/2013  . Malaria    while in the service  . Peripheral vascular disease ([place])    slow wound healing on legs    Patient Active Problem List   Diagnosis Date Noted  . Venous stasis dermatitis 05/28/2015  . Wound, open, toe 05/28/2015    . Insomnia 05/28/2015  . Lymphoma ([place]) 09/05/2013  . Cancer ([place]) 09/05/2013  . Lymphadenopathy, cervical 08/29/2013    Class: Chronic  . Acid reflux   . Hypertension   . Peripheral vascular disease [facility])     Past Surgical History:  Procedure Laterality Date  . AMPUTATION Left 06/25/2015   Procedure: LEFT  HALLUX AMPUTATION,;  Surgeon: Wylene Simmer, MD;  Location: [place];  Service: Orthopedics;  Laterality: Left;  LOCAL/MAC  . MASS BIOPSY Left 09/05/2013   Procedure: EXCISIONAL BIOPSY OF LEFT NECK MASS;  Surgeon: Jerrell Belfast, MD;  Location: [place];  Service: ENT;  Laterality: Left;  . TONSILLECTOMY    . YAG LASER APPLICATION Right 03/13/6062   Procedure: YAG LASER APPLICATION;  Surgeon: [name], MD;  Location: [facility];  Service: Ophthalmology;  Laterality: Right;    Home Medications    Prior to Admission medications   Medication Sig Start Date End Date Taking? Authorizing Provider  acetaminophen (TYLENOL) 325 MG tablet Take 325 mg by mouth every 6 (six) hours as needed for pain.     Historical Provider, MD  aspirin EC 81 MG tablet Take 1 tablet (81 mg total) by mouth daily. 09/05/13   Jerrell Belfast, MD  cephALEXin (KEFLEX) 250 MG capsule Take 1 capsule (250 mg total) by mouth 2 (two) times daily. Patient not taking: Reported on 01/16/2017 12/28/16   Alycia Rossetti, MD  cephALEXin [facility])  500 MG capsule Take 1 capsule (500 mg total) by mouth 3 (three) times daily. 01/16/17   Trula Slade, DPM  diclofenac (VOLTAREN) 75 MG EC tablet Take 1 tablet (75 mg total) by mouth 2 (two) times daily. 06/09/16   Trula Slade, DPM  enalapril (VASOTEC) 20 MG tablet TAKE 1 TABLET BY MOUTH  EVERY DAY 09/26/16   Susy Frizzle, MD  furosemide (LASIX) 20 MG tablet TAKE 1 TABLET BY MOUTH  EVERY DAY 09/26/16   Susy Frizzle, MD  gabapentin (NEURONTIN) 100 MG capsule Take 2 capsules every morning 11/03/16   Max T Hyatt, DPM  gabapentin (NEURONTIN) 300 MG capsule Take  1 capsule (300 mg total) by mouth at bedtime. 11/03/16   Max T Hyatt, DPM  levothyroxine (SYNTHROID, LEVOTHROID) 88 MCG tablet TAKE 1 TABLET BY MOUTH  EVERY DAY 09/26/16   Susy Frizzle, MD  meclizine (ANTIVERT) 25 MG tablet Take 1 tablet (25 mg total) by mouth 3 (three) times daily as needed for dizziness. 04/11/14   Susy Frizzle, MD  mometasone (ELOCON) 0.1 % ointment Apply topically daily. 01/03/17   Susy Frizzle, MD  NONFORMULARY OR COMPOUNDED ITEM [facility]:  Peripheral Neuropathy cream - Bupivacaine 1%, Doxepin 3%, Gabapentin 6%, Pentoxifylline 3%, Topiramate 1%, apply 1-2 grams to affected area 3-4 times daily. 05/03/16   Max T Hyatt, DPM  omeprazole (PRILOSEC) 20 MG capsule Take 20 mg by mouth daily as needed (heartburn).     Historical Provider, MD  temazepam (RESTORIL) 15 MG capsule Take 1 capsule (15 mg total) by mouth at bedtime as needed for sleep. 05/28/15   Lonie Peak Dixon, PA-C  traMADol (ULTRAM) 50 MG tablet Take 1 tablet (50 mg total) by mouth every 8 (eight) hours as needed. 12/28/16   Alycia Rossetti, MD  triamcinolone cream (KENALOG) 0.1 % APPLY TO AFFECTED AREA TWICE A DAY FOR 2-4 WEEKS AS NEEDED FOR FLARES 04/18/16   Historical Provider, MD  vitamin B-12 (CYANOCOBALAMIN) 1000 MCG tablet Take 1 tablet (1,000 mcg total) by mouth daily. 05/01/15   Susy Frizzle, MD    Family History No family history on file.  Social History Social History  Substance Use Topics  . Smoking status: Never Smoker  . Smokeless tobacco: Former Systems developer    Quit date: 10/05/1979  . Alcohol use No     Allergies   Codeine; Hydrocodone; and Percocet [oxycodone-acetaminophen]   Review of Systems Review of Systems  Constitutional: Negative for fever.  Respiratory: Negative for shortness of breath.   Cardiovascular: Negative for chest pain.  Gastrointestinal: Negative for abdominal pain.  Musculoskeletal: Positive for myalgias.  Skin: Positive for color change (redness) and wound.   Neurological: Negative for numbness and headaches.  All other systems reviewed and are negative.  Physical Exam Updated Vital Signs BP 192/92 (BP Location: Left Arm)   Pulse 70   Temp 98.2 F (36.8 C) (Oral)   Resp 12   SpO2 100%   Physical Exam  Constitutional: He is oriented to person, place, and time. He appears well-developed and well-nourished. No distress.  HENT:  Head: Normocephalic and atraumatic.  Mouth/Throat: Oropharynx is clear and moist. No oropharyngeal exudate.  Moist mucous membranes   Eyes: Conjunctivae are normal. Pupils are equal, round, and reactive to light.  Neck: No JVD present.  Trachea midline No bruit  Cardiovascular: Normal rate, regular rhythm, normal heart sounds and intact distal pulses.   Pulmonary/Chest: Effort normal and breath sounds normal. No  stridor. No respiratory distress. He has no wheezes. He has no rales.  Abdominal: Soft. Bowel sounds are normal. He exhibits no distension and no mass. There is no tenderness. There is no rebound and no guarding.  Musculoskeletal: Normal range of motion. He exhibits tenderness.       Left foot: There is tenderness and swelling. There is no bony tenderness, normal capillary refill, no crepitus, no deformity and no laceration.       Feet:  Intact distal pulses. Achilles tendon intact. DTR intact.   Neurological: He is alert and oriented to person, place, and time. He has normal reflexes.  Skin: Skin is warm and dry.  Cracking at base of toes 2,3 and 4. Serous drainage noted. Mild redness and warmth at the dorsum of the left foot.  Psychiatric: He has a normal mood and affect. His behavior is normal.  Nursing note and vitals reviewed.  ED Treatments / Results   Vitals:   01/18/17 0256 01/18/17 0300  BP: (!) 180/88 (!) 166/88  Pulse:  76  Resp: 18 15  Temp: 97.9 F (36.6 C) 98.2 F (36.8 C)    Results for orders placed or performed during the hospital encounter of 01/17/17  Comprehensive  metabolic panel  Result Value Ref Range   Sodium 140 135 - 145 mmol/L   Potassium 4.7 3.5 - 5.1 mmol/L   Chloride 104 101 - 111 mmol/L   CO2 28 22 - 32 mmol/L   Glucose, Bld 124 (H) 65 - 99 mg/dL   BUN 23 (H) 6 - 20 mg/dL   Creatinine, Ser 1.43 (H) 0.61 - 1.24 mg/dL   Calcium 9.5 8.9 - 10.3 mg/dL   Total Protein 7.6 6.5 - 8.1 g/dL   Albumin 4.3 3.5 - 5.0 g/dL   AST 19 15 - 41 U/L   ALT 13 (L) 17 - 63 U/L   Alkaline Phosphatase 59 38 - 126 U/L   Total Bilirubin 1.0 0.3 - 1.2 mg/dL   GFR calc non Af Amer 40 (L) >60 mL/min   GFR calc Af Amer 46 (L) >60 mL/min   Anion gap 8 5 - 15  CBC with Differential  Result Value Ref Range   WBC 5.3 4.0 - 10.5 K/uL   RBC 4.63 4.22 - 5.81 MIL/uL   Hemoglobin 14.4 13.0 - 17.0 g/dL   HCT 42.8 39.0 - 52.0 %   MCV 92.4 78.0 - 100.0 fL   MCH 31.1 26.0 - 34.0 pg   MCHC 33.6 30.0 - 36.0 g/dL   RDW 13.8 11.5 - 15.5 %   Platelets 153 150 - 400 K/uL   Neutrophils Relative % 71 %   Neutro Abs 3.8 1.7 - 7.7 K/uL   Lymphocytes Relative 14 %   Lymphs Abs 0.7 0.7 - 4.0 K/uL   Monocytes Relative 12 %   Monocytes Absolute 0.6 0.1 - 1.0 K/uL   Eosinophils Relative 3 %   Eosinophils Absolute 0.2 0.0 - 0.7 K/uL   Basophils Relative 0 %   Basophils Absolute 0.0 0.0 - 0.1 K/uL  I-Stat CG4 Lactic Acid, ED  Result Value Ref Range   Lactic Acid, Venous 1.10 0.5 - 1.9 mmol/L  I-Stat CG4 Lactic Acid, ED  Result Value Ref Range   Lactic Acid, Venous 2.38 (HH) 0.5 - 1.9 mmol/L   Comment NOTIFIED PHYSICIAN    Dg Foot Complete Left  Result Date: 01/18/2017 CLINICAL DATA:  Inflamed foot painful and swollen EXAM: LEFT FOOT - COMPLETE 3+ VIEW  COMPARISON:  MRI 06/03/2015 FINDINGS: Diffuse osteopenia is present. Patient is status post amputation of the tuft of the first distal phalanx. No gross periostitis or cortical bone destruction. Diffuse soft tissue swelling. No soft tissue gas collections. Mild vascular calcifications. Small plantar spur. IMPRESSION: 1. Diffuse  soft tissue swelling and osteopenia. No obvious bony destructive process or periosteal reaction. 2. Patient is status post amputation of the tuft of the first distal phalanx. Electronically Signed   By: Donavan Foil M.D.   On: 01/18/2017 00:23    DIAGNOSTIC STUDIES:  Oxygen Saturation is 100% on RA, normal by my interpretation.    COORDINATION OF CARE:  11:35 PM Discussed treatment plan with pt at bedside and pt agreed to plan.   Radiology No results found.  Procedures Procedures (including critical care time)  Medications Ordered in ED  Medications  gabapentin (NEURONTIN) capsule 200 mg (not administered)  gabapentin (NEURONTIN) capsule 300 mg (not administered)  enalapril (VASOTEC) tablet 20 mg (not administered)  levothyroxine (SYNTHROID, LEVOTHROID) tablet 88 mcg (not administered)  aspirin EC tablet 81 mg (not administered)  acetaminophen (TYLENOL) tablet 650 mg (not administered)    Or  acetaminophen (TYLENOL) suppository 650 mg (not administered)  docusate sodium (COLACE) capsule 100 mg (not administered)  ondansetron (ZOFRAN) tablet 4 mg (not administered)    Or  ondansetron (ZOFRAN) injection 4 mg (not administered)  cefTRIAXone (ROCEPHIN) 2 g in dextrose 5 % 50 mL IVPB (2 g Intravenous Given 01/18/17 0328)    And  metroNIDAZOLE (FLAGYL) tablet 500 mg (not administered)  enoxaparin (LOVENOX) injection 30 mg (not administered)  morphine 4 MG/ML injection 2 mg (not administered)  ketorolac (TORADOL) 30 MG/ML injection 15 mg (15 mg Intravenous Given 01/18/17 0029)  sodium chloride 0.9 % bolus 500 mL (0 mLs Intravenous Stopped 01/18/17 0220)  vancomycin (VANCOCIN) IVPB 1000 mg/200 mL premix (0 mg Intravenous Stopped 01/18/17 0220)     Final Clinical Impressions(s) / ED Diagnoses  Cellulitis with wound failure of outpatient management: will admit for IV abx   I personally performed the services described in this documentation, which was scribed in my presence. The  recorded information has been reviewed and is accurate.      Veatrice Kells, MD 01/18/17 [phone]

## 2017-01-17 NOTE — ED Triage Notes (Signed)
Patient had issues with bilat feet over last several years. Patient has had partial amputation of left big toe. patient been having worsening pain in left foot and MD saw it today and believes it could be infected and he needed to get to ED

## 2017-01-18 ENCOUNTER — Emergency Department (HOSPITAL_COMMUNITY): Payer: Medicare Other

## 2017-01-18 ENCOUNTER — Inpatient Hospital Stay (HOSPITAL_COMMUNITY): Payer: Medicare Other

## 2017-01-18 ENCOUNTER — Telehealth: Payer: Self-pay | Admitting: Podiatry

## 2017-01-18 ENCOUNTER — Encounter (HOSPITAL_COMMUNITY): Payer: Self-pay | Admitting: Emergency Medicine

## 2017-01-18 DIAGNOSIS — Z89412 Acquired absence of left great toe: Secondary | ICD-10-CM | POA: Diagnosis not present

## 2017-01-18 DIAGNOSIS — I872 Venous insufficiency (chronic) (peripheral): Secondary | ICD-10-CM | POA: Diagnosis not present

## 2017-01-18 DIAGNOSIS — Z79899 Other long term (current) drug therapy: Secondary | ICD-10-CM | POA: Diagnosis not present

## 2017-01-18 DIAGNOSIS — B353 Tinea pedis: Secondary | ICD-10-CM | POA: Diagnosis present

## 2017-01-18 DIAGNOSIS — Z8572 Personal history of non-Hodgkin lymphomas: Secondary | ICD-10-CM | POA: Diagnosis not present

## 2017-01-18 DIAGNOSIS — S68012A Complete traumatic metacarpophalangeal amputation of left thumb, initial encounter: Secondary | ICD-10-CM | POA: Diagnosis not present

## 2017-01-18 DIAGNOSIS — I10 Essential (primary) hypertension: Secondary | ICD-10-CM | POA: Diagnosis present

## 2017-01-18 DIAGNOSIS — I739 Peripheral vascular disease, unspecified: Secondary | ICD-10-CM

## 2017-01-18 DIAGNOSIS — S91109A Unspecified open wound of unspecified toe(s) without damage to nail, initial encounter: Secondary | ICD-10-CM | POA: Diagnosis not present

## 2017-01-18 DIAGNOSIS — L0390 Cellulitis, unspecified: Secondary | ICD-10-CM | POA: Diagnosis present

## 2017-01-18 DIAGNOSIS — Z885 Allergy status to narcotic agent status: Secondary | ICD-10-CM | POA: Diagnosis not present

## 2017-01-18 DIAGNOSIS — L97529 Non-pressure chronic ulcer of other part of left foot with unspecified severity: Secondary | ICD-10-CM | POA: Diagnosis present

## 2017-01-18 DIAGNOSIS — E039 Hypothyroidism, unspecified: Secondary | ICD-10-CM | POA: Diagnosis present

## 2017-01-18 DIAGNOSIS — M79672 Pain in left foot: Secondary | ICD-10-CM | POA: Diagnosis present

## 2017-01-18 DIAGNOSIS — R739 Hyperglycemia, unspecified: Secondary | ICD-10-CM | POA: Diagnosis present

## 2017-01-18 DIAGNOSIS — Z87891 Personal history of nicotine dependence: Secondary | ICD-10-CM | POA: Diagnosis not present

## 2017-01-18 DIAGNOSIS — L03116 Cellulitis of left lower limb: Secondary | ICD-10-CM | POA: Diagnosis not present

## 2017-01-18 DIAGNOSIS — Z7982 Long term (current) use of aspirin: Secondary | ICD-10-CM | POA: Diagnosis not present

## 2017-01-18 DIAGNOSIS — K219 Gastro-esophageal reflux disease without esophagitis: Secondary | ICD-10-CM | POA: Diagnosis present

## 2017-01-18 DIAGNOSIS — I998 Other disorder of circulatory system: Secondary | ICD-10-CM | POA: Diagnosis not present

## 2017-01-18 DIAGNOSIS — G8929 Other chronic pain: Secondary | ICD-10-CM | POA: Diagnosis present

## 2017-01-18 DIAGNOSIS — Z66 Do not resuscitate: Secondary | ICD-10-CM | POA: Diagnosis present

## 2017-01-18 LAB — CBC
HCT: 38.3 % — ABNORMAL LOW (ref 39.0–52.0)
Hemoglobin: 12.7 g/dL — ABNORMAL LOW (ref 13.0–17.0)
MCH: 29.9 pg (ref 26.0–34.0)
MCHC: 33.2 g/dL (ref 30.0–36.0)
MCV: 90.1 fL (ref 78.0–100.0)
Platelets: 145 10*3/uL — ABNORMAL LOW (ref 150–400)
RBC: 4.25 MIL/uL (ref 4.22–5.81)
RDW: 13.6 % (ref 11.5–15.5)
WBC: 3.7 10*3/uL — ABNORMAL LOW (ref 4.0–10.5)

## 2017-01-18 LAB — PREALBUMIN: Prealbumin: 14.7 mg/dL — ABNORMAL LOW (ref 18–38)

## 2017-01-18 LAB — BASIC METABOLIC PANEL
Anion gap: 5 (ref 5–15)
BUN: 22 mg/dL — ABNORMAL HIGH (ref 6–20)
CO2: 27 mmol/L (ref 22–32)
Calcium: 9.1 mg/dL (ref 8.9–10.3)
Chloride: 108 mmol/L (ref 101–111)
Creatinine, Ser: 1.36 mg/dL — ABNORMAL HIGH (ref 0.61–1.24)
GFR calc Af Amer: 49 mL/min — ABNORMAL LOW (ref >60)
GFR calc non Af Amer: 43 mL/min — ABNORMAL LOW (ref >60)
Glucose, Bld: 129 mg/dL — ABNORMAL HIGH (ref 65–99)
Potassium: 3.9 mmol/L (ref 3.5–5.1)
Sodium: 140 mmol/L (ref 135–145)

## 2017-01-18 LAB — C-REACTIVE PROTEIN: CRP: 4.5 mg/dL — ABNORMAL HIGH (ref <1.0)

## 2017-01-18 LAB — LACTIC ACID, PLASMA
Lactic Acid, Venous: 1.5 mmol/L (ref 0.5–1.9)
Lactic Acid, Venous: 1.5 mmol/L (ref 0.5–1.9)

## 2017-01-18 LAB — SEDIMENTATION RATE: Sed Rate: 15 mm/hr (ref 0–16)

## 2017-01-18 MED ORDER — ASPIRIN EC 81 MG PO TBEC
81.0000 mg | DELAYED_RELEASE_TABLET | Freq: Every day | ORAL | Status: DC
  Administered 2017-01-18 – 2017-01-20 (×3): 81 mg via ORAL
  Filled 2017-01-18 (×3): qty 1

## 2017-01-18 MED ORDER — DOCUSATE SODIUM 100 MG PO CAPS
100.0000 mg | ORAL_CAPSULE | Freq: Two times a day (BID) | ORAL | Status: DC
  Administered 2017-01-18 – 2017-01-19 (×4): 100 mg via ORAL
  Filled 2017-01-18 (×4): qty 1

## 2017-01-18 MED ORDER — ENOXAPARIN SODIUM 40 MG/0.4ML ~~LOC~~ SOLN
40.0000 mg | SUBCUTANEOUS | Status: DC
  Administered 2017-01-19 – 2017-01-20 (×2): 40 mg via SUBCUTANEOUS
  Filled 2017-01-18: qty 0.4

## 2017-01-18 MED ORDER — ACETAMINOPHEN 325 MG PO TABS
650.0000 mg | ORAL_TABLET | Freq: Four times a day (QID) | ORAL | Status: DC | PRN
  Administered 2017-01-18: 650 mg via ORAL
  Filled 2017-01-18: qty 2

## 2017-01-18 MED ORDER — ONDANSETRON HCL 4 MG PO TABS: 4.0000 mg | ORAL_TABLET | Freq: Four times a day (QID) | ORAL | Status: DC | PRN

## 2017-01-18 MED ORDER — KETOROLAC TROMETHAMINE 30 MG/ML IJ SOLN
15.0000 mg | Freq: Once | INTRAMUSCULAR | Status: AC
  Administered 2017-01-18: 15 mg via INTRAVENOUS
  Filled 2017-01-18: qty 1

## 2017-01-18 MED ORDER — MORPHINE SULFATE (PF) 4 MG/ML IV SOLN: 2.0000 mg | INTRAVENOUS | Status: DC | PRN

## 2017-01-18 MED ORDER — ENALAPRIL MALEATE 20 MG PO TABS
20.0000 mg | ORAL_TABLET | Freq: Every day | ORAL | Status: DC
  Administered 2017-01-18 – 2017-01-20 (×3): 20 mg via ORAL
  Filled 2017-01-18 (×3): qty 1

## 2017-01-18 MED ORDER — METRONIDAZOLE 500 MG PO TABS
500.0000 mg | ORAL_TABLET | Freq: Three times a day (TID) | ORAL | Status: DC
  Administered 2017-01-18 – 2017-01-20 (×7): 500 mg via ORAL
  Filled 2017-01-18 (×7): qty 1

## 2017-01-18 MED ORDER — LEVOTHYROXINE SODIUM 88 MCG PO TABS
88.0000 ug | ORAL_TABLET | Freq: Every day | ORAL | Status: DC
  Administered 2017-01-18 – 2017-01-20 (×3): 88 ug via ORAL
  Filled 2017-01-18 (×3): qty 1

## 2017-01-18 MED ORDER — ADULT MULTIVITAMIN W/MINERALS CH
1.0000 | ORAL_TABLET | Freq: Every day | ORAL | Status: DC
  Administered 2017-01-19 – 2017-01-20 (×2): 1 via ORAL
  Filled 2017-01-18 (×3): qty 1

## 2017-01-18 MED ORDER — GABAPENTIN 100 MG PO CAPS
200.0000 mg | ORAL_CAPSULE | Freq: Every morning | ORAL | Status: DC
  Administered 2017-01-18 – 2017-01-20 (×3): 200 mg via ORAL
  Filled 2017-01-18 (×3): qty 2

## 2017-01-18 MED ORDER — SODIUM CHLORIDE 0.9 % IV BOLUS (SEPSIS)
500.0000 mL | Freq: Once | INTRAVENOUS | Status: AC
  Administered 2017-01-18: 500 mL via INTRAVENOUS

## 2017-01-18 MED ORDER — DEXTROSE 5 % IV SOLN
2.0000 g | INTRAVENOUS | Status: DC
  Administered 2017-01-18 – 2017-01-20 (×3): 2 g via INTRAVENOUS
  Filled 2017-01-18 (×3): qty 2

## 2017-01-18 MED ORDER — GABAPENTIN 300 MG PO CAPS
300.0000 mg | ORAL_CAPSULE | Freq: Every day | ORAL | Status: DC
  Administered 2017-01-18 – 2017-01-19 (×2): 300 mg via ORAL
  Filled 2017-01-18 (×2): qty 1

## 2017-01-18 MED ORDER — ENOXAPARIN SODIUM 30 MG/0.3ML ~~LOC~~ SOLN
30.0000 mg | SUBCUTANEOUS | Status: DC
  Administered 2017-01-18: 09:00:00 30 mg via SUBCUTANEOUS
  Filled 2017-01-18: qty 0.3

## 2017-01-18 MED ORDER — ACETAMINOPHEN 650 MG RE SUPP: 650.0000 mg | Freq: Four times a day (QID) | RECTAL | Status: DC | PRN

## 2017-01-18 MED ORDER — ENSURE ENLIVE PO LIQD
237.0000 mL | Freq: Two times a day (BID) | ORAL | Status: DC
  Administered 2017-01-19 – 2017-01-20 (×3): 237 mL via ORAL

## 2017-01-18 MED ORDER — VANCOMYCIN HCL IN DEXTROSE 1-5 GM/200ML-% IV SOLN
1000.0000 mg | Freq: Once | INTRAVENOUS | Status: AC
  Administered 2017-01-18: 1000 mg via INTRAVENOUS
  Filled 2017-01-18: qty 200

## 2017-01-18 MED ORDER — ONDANSETRON HCL 4 MG/2ML IJ SOLN: 4.0000 mg | Freq: Four times a day (QID) | INTRAMUSCULAR | Status: DC | PRN

## 2017-01-18 NOTE — Care Management Note (Signed)
Case Management Note  Patient Details  Name: [name] MRN: [id number] Date of Birth: 06-17-1919  Subjective/Objective:                  cellulitis Action/Plan: discharge Expected Discharge Date:                  Expected Discharge Plan:  [place]  In-House Referral:     Discharge planning Services  CM Consult  Post Acute Care Choice:  Home Health Choice offered to:  Patient, Adult Children  DME Arranged:  N/A DME Agency:  NA  HH Arranged:  RN, PT, OT, Nurse's Aide [place] Agency:  [facility] (formerly [employer])  Status of Service:  In process, will continue to follow  If discussed at Long Length of Stay Meetings, dates discussed:    Additional Comments: CM met with pt and daughter in room to offer choice of home health agency.  Daughter states pt lives with wife at home (wife has dementia and pt is the caretaker); pt has 4 adult children who take turns staying with the pt and his wife at home.  The main contact is daughter and [name], [phone] (home), [phone] (cell).  Daughter states no additional DME is needed at home.  CM explained limited choice of home health agencies and family is ok with [facility].  Referral called to [facility] rep, Linus Mako. Who is waiting for face to face and home health orders.  CM has text requested face to Face and HHRN/PT/OT/aide orders.  CM will continue to follow for orders. Dellie Catholic, RN 01/18/2017, 12:38 PM

## 2017-01-18 NOTE — Progress Notes (Signed)
  VASCULAR LAB PRELIMINARY  ARTERIAL  ABI completed:    RIGHT    LEFT    PRESSURE WAVEFORM  PRESSURE WAVEFORM  BRACHIAL 191 Triphasic BRACHIAL 186 Triphasic  DP 179 Dampened Monophasic DP 72 Severely Dampened Monophasic  PT 145 Moniophasic PT 128 Dampened Monophasic    RIGHT LEFT  ABI 0.94 0.67   ABI on the right indicates normal arterial flow however Doppler waveforms would suggest a possible false elevation of pressures. ABIs on the left indicate a moderate reduction in arterial flow however Doppler waveforms may suggest a false elevation of pressures.  Iver Fehrenbach, RVS 01/18/2017, 4:29 PM

## 2017-01-18 NOTE — Progress Notes (Signed)
[facility]  PROGRESS NOTE  [name] XFG:[id number] DOB: [birth year] DOA: 01/17/2017 PCP: Odette Fraction, MD   Brief HPI:     83 y.o. male with medical history significant of PVD, hypothyroidism, HTN, non-hodgkins lymphoma in remission, and amputation of left great toe from non-healing wound.  He presents because he is having problems with his left toe.  Foot has been hurting.  Trouble for several years.  Has had wraps, wears support hose.  [facility]es [facility].  Scaly at one point.  Got infection in left foot at one point and needed amputation.  Had athlete's foot, took PO Lamisil over the summer.  Seemed to come back when he ran out of Lamisil.  Washing them every night.  ?irritating left foot.  Dries them as directed.  The last few days, pain in left toe area and up the foot.  Saw podiatry, concern for infection.  Started Keflex and told him to return if it worsened.  The past started last night.  He been in significant pain in toe and foot through the day today.  They saw Dr. Dennard Schaumann and he was concerned about infection and so thought he needed hospitalization and antibiotics.  No fevers.  Does have some chronic pain along the left lower leg.  Pain has been present, but the skin cracked open this week and this is new.  Toes swelled up like they would burst and then the cracked open the next day.     Subjective   Patient denies pain this morning   Assessment/Plan:     1. Cellulitis/Diabetic foot ulcer- Patient started on Rocephin and Flagyl. Slowly improving, wound care consulted. Blood cultures 2 negative to date. 2. Peripheral vascular disease- ABI confirms moderate reduction in blood flow in the left lower extremity. Patient has known  history of PAD. We'll consult physical surgery if no improvement with antibiotics. 3. Hypothyroidism-continue Synthroid 4. Hypertension-continue Vasotec 20 mg by mouth daily   DVT prophylaxis: Lovenox  Code Status: DO NOT  RESUSCITATE  Family Communication: No family at bedside  Disposition Plan: Home when medically stable   Consultants:  None   Procedures:  ABI   Antibiotics:   Anti-infectives    Start     Dose/Rate Route Frequency Ordered Stop   01/18/17 0600  metroNIDAZOLE (FLAGYL) tablet 500 mg     500 mg Oral Every 8 hours 01/18/17 0248     01/18/17 0248  cefTRIAXone (ROCEPHIN) 2 g in dextrose 5 % 50 mL IVPB     2 g 100 mL/hr over 30 Minutes Intravenous Every 24 hours 01/18/17 0248     01/18/17 0045  vancomycin (VANCOCIN) IVPB 1000 mg/200 mL premix     1,000 mg 200 mL/hr over 60 Minutes Intravenous  Once 01/18/17 0035 01/18/17 0220       Objective   Vitals:   01/18/17 0300 01/18/17 0610 01/18/17 0858 01/18/17 1334  BP: (!) 166/88 140/68 (!) 172/84 (!) 163/79  Pulse: 76 64  74  Resp: 15 14  16   Temp: 98.2 F (36.8 C) 97.9 F (36.6 C)  97.8 F (36.6 C)  TempSrc: Oral Oral  Oral  SpO2: 99% 99%  97%  Weight:      Height:        Intake/Output Summary (Last 24 hours) at 01/18/17 1653 Last data filed at 01/18/17 1335  Gross per 24 hour  Intake              650 ml  Output                0 ml  Net              650 ml   Filed Weights   01/18/17 0256  Weight: 93.4 kg (206 lb)     Physical Examination:  General exam: Appears calm and comfortable. Respiratory system: Clear to auscultation. Respiratory effort normal. Cardiovascular system:  RRR. No  murmurs, rubs, gallops. No pedal edema. GI system: Abdomen is nondistended, soft and nontender. No organomegaly.  Central nervous system. No focal neurological deficits. 5 x 5 power in all extremities. Skin: Bilateral erythema noted in lower extremities Psychiatry: Alert, oriented x 3.Judgement and insight appear normal. Affect normal.    Data Reviewed: I have personally reviewed following labs and imaging studies  CBG: No results for input(s): GLUCAP in the last 168 hours.  CBC:  Recent Labs Lab 01/17/17 1903  01/18/17 0500  WBC 5.3 3.7*  NEUTROABS 3.8  --   HGB 14.4 12.7*  HCT 42.8 38.3*  MCV 92.4 90.1  PLT 153 145*    Basic Metabolic Panel:  Recent Labs Lab 01/17/17 1903 01/18/17 0500  NA 140 140  K 4.7 3.9  CL 104 108  CO2 28 27  GLUCOSE 124* 129*  BUN 23* 22*  CREATININE 1.43* 1.36*  CALCIUM 9.5 9.1    No results found for this or any previous visit (from the past 240 hour(s)).   Liver Function Tests:  Recent Labs Lab 01/17/17 1903  AST 19  ALT 13*  ALKPHOS 59  BILITOT 1.0  PROT 7.6  ALBUMIN 4.3       Studies: Dg Foot Complete Left  Result Date: 01/18/2017 CLINICAL DATA:  Inflamed foot painful and swollen EXAM: LEFT FOOT - COMPLETE 3+ VIEW COMPARISON:  MRI 06/03/2015 FINDINGS: Diffuse osteopenia is present. Patient is status post amputation of the tuft of the first distal phalanx. No gross periostitis or cortical bone destruction. Diffuse soft tissue swelling. No soft tissue gas collections. Mild vascular calcifications. Small plantar spur. IMPRESSION: 1. Diffuse soft tissue swelling and osteopenia. No obvious bony destructive process or periosteal reaction. 2. Patient is status post amputation of the tuft of the first distal phalanx. Electronically Signed   By: Donavan Foil M.D.   On: 01/18/2017 00:23    Scheduled Meds: . aspirin EC  81 mg Oral Daily  . cefTRIAXone (ROCEPHIN)  IV  2 g Intravenous Q24H   And  . metroNIDAZOLE  500 mg Oral Q8H  . docusate sodium  100 mg Oral BID  . enalapril  20 mg Oral Daily  . [START ON 01/19/2017] enoxaparin (LOVENOX) injection  40 mg Subcutaneous Q24H  . feeding supplement (ENSURE ENLIVE)  237 mL Oral BID BM  . gabapentin  200 mg Oral q morning - 10a  . gabapentin  300 mg Oral QHS  . levothyroxine  88 mcg Oral Daily  . multivitamin with minerals  1 tablet Oral Daily      Time spent: 25 min  [facility] Pager [phone]. If 7PM-7AM, please contact night-coverage at [email or web address], Office   [phone]  password [email or web address] 01/18/2017, 4:53 PM  LOS: 0 days

## 2017-01-18 NOTE — H&P (Signed)
History and Physical    DESIREE FLEMING LAG:[id number] DOB: 1919-02-10 DOA: 01/17/2017  PCP: Odette Fraction, MD Consultants:  Ubaldo Glassing - dermatology; Hyatt/Wagner - [place]; [facility] - orthopedics; [facility] - oncology Patient coming from: home - lives with wife, 4 grown children stay with them most nights; NOK: daughter, [name] [phone] (home), [phone] (cell)  Chief Complaint: foot infection  HPI: [name] is a 83 y.o. male with medical history significant of PVD, hypothyroidism, HTN, non-hodgkins lymphoma in remission, and amputation of left great toe from non-healing wound.  He presents because he is having problems with his left toe.  Foot has been hurting.  Trouble for several years.  Has had wraps, wears support hose.  [facility].  Scaly at one point.  Got infection in left foot at one point and needed amputation.  Had athlete's foot, took PO Lamisil over the summer.  Seemed to come back when he ran out of Lamisil.  Washing them every night.  ?irritating left foot.  Dries them as directed.  The last few days, pain in left toe area and up the foot.  Saw podiatry, concern for infection.  Started Keflex and told him to return if it worsened.  The past started last night.  He been in significant pain in toe and foot through the day today.  They saw Dr. Dennard Schaumann and he was concerned about infection and so thought he needed hospitalization and antibiotics.  No fevers.  Does have some chronic pain along the left lower leg.  Pain has been present, but the skin cracked open this week and this is new.  Toes swelled up like they would burst and then the cracked open the next day.     ED Course: foot xray, concern for infection, repeat lactate elevated  Review of Systems: As per HPI; otherwise 10 point review of systems reviewed and negative.   Ambulatory Status:  Ambulates with a walker   Past Medical History:  Diagnosis Date  . Acid reflux   . Cancer ([facility]) 09/05/13     left neck-non-hodgkins lymphoma  . Hypertension    requires diuretic  . Hypothyroidism   . Malaria    while in the service  . Peripheral vascular disease ([facility])    slow wound healing on legs    Past Surgical History:  Procedure Laterality Date  . AMPUTATION Left 06/25/2015   Procedure: LEFT  HALLUX AMPUTATION,;  Surgeon: Wylene Simmer, MD;  Location: [facility];  Service: Orthopedics;  Laterality: Left;  LOCAL/MAC  . MASS BIOPSY Left 09/05/2013   Procedure: EXCISIONAL BIOPSY OF LEFT NECK MASS;  Surgeon: Jerrell Belfast, MD;  Location: [facility];  Service: ENT;  Laterality: Left;  . TONSILLECTOMY    . YAG LASER APPLICATION Right 4/50/3888   Procedure: YAG LASER APPLICATION;  Surgeon: [name], MD;  Location: [facility];  Service: Ophthalmology;  Laterality: Right;    Social History   Social History  . Marital status: Married    Spouse name: N/A  . Number of children: 4  . Years of education: N/A   Occupational History  . retired    Social History Main Topics  . Smoking status: Former Smoker    Types: Cigars  . Smokeless tobacco: Former Systems developer    Quit date: 10/05/1979  . Alcohol use No  . Drug use: No  . Sexual activity: Not on file   Other Topics Concern  . Not on file   Social  History Narrative  . No narrative on file    Allergies  Allergen Reactions  . Codeine Nausea And Vomiting  . Hydrocodone Nausea And Vomiting  . Percocet [Oxycodone-Acetaminophen] Nausea And Vomiting    History reviewed. No pertinent family history.  Prior to Admission medications   Medication Sig Start Date End Date Taking? Authorizing Provider  aspirin EC 81 MG tablet Take 1 tablet (81 mg total) by mouth daily. 09/05/13  Yes Jerrell Belfast, MD  cephALEXin (KEFLEX) 500 MG capsule Take 1 capsule (500 mg total) by mouth 3 (three) times daily. 01/16/17  Yes Trula Slade, DPM  enalapril (VASOTEC) 20 MG tablet TAKE 1 TABLET BY MOUTH  EVERY DAY 09/26/16  Yes Susy Frizzle, MD   furosemide (LASIX) 20 MG tablet TAKE 1 TABLET BY MOUTH  EVERY DAY 09/26/16  Yes Susy Frizzle, MD  gabapentin (NEURONTIN) 100 MG capsule Take 2 capsules every morning 11/03/16  Yes Max T Hyatt, DPM  gabapentin (NEURONTIN) 300 MG capsule Take 1 capsule (300 mg total) by mouth at bedtime. 11/03/16  Yes Max T Hyatt, DPM  levothyroxine (SYNTHROID, LEVOTHROID) 88 MCG tablet TAKE 1 TABLET BY MOUTH  EVERY DAY 09/26/16  Yes Susy Frizzle, MD  NONFORMULARY OR COMPOUNDED [place]:  Peripheral Neuropathy cream - Bupivacaine 1%, Doxepin 3%, Gabapentin 6%, Pentoxifylline 3%, Topiramate 1%, apply 1-2 grams to affected area 3-4 times daily. 05/03/16  Yes Max T Hyatt, DPM  triamcinolone cream (KENALOG) 0.1 % APPLY TO AFFECTED AREA TWICE A DAY FOR 2-4 WEEKS AS NEEDED FOR FLARES 04/18/16  Yes Historical Provider, MD  vitamin B-12 (CYANOCOBALAMIN) 1000 MCG tablet Take 1 tablet (1,000 mcg total) by mouth daily. 05/01/15  Yes Susy Frizzle, MD  cephALEXin (KEFLEX) 250 MG capsule Take 1 capsule (250 mg total) by mouth 2 (two) times daily. Patient not taking: Reported on 01/16/2017 12/28/16   Alycia Rossetti, MD  diclofenac (VOLTAREN) 75 MG EC tablet Take 1 tablet (75 mg total) by mouth 2 (two) times daily. Patient not taking: Reported on 01/18/2017 06/09/16   Trula Slade, DPM  meclizine (ANTIVERT) 25 MG tablet Take 1 tablet (25 mg total) by mouth 3 (three) times daily as needed for dizziness. Patient not taking: Reported on 01/18/2017 04/11/14   Susy Frizzle, MD  mometasone (ELOCON) 0.1 % ointment Apply topically daily. Patient not taking: Reported on 01/18/2017 01/03/17   Susy Frizzle, MD  temazepam (RESTORIL) 15 MG capsule Take 1 capsule (15 mg total) by mouth at bedtime as needed for sleep. Patient not taking: Reported on 01/18/2017 05/28/15   Orlena Sheldon, PA-C  traMADol (ULTRAM) 50 MG tablet Take 1 tablet (50 mg total) by mouth every 8 (eight) hours as needed. Patient not taking: Reported on  01/18/2017 12/28/16   Alycia Rossetti, MD    Physical Exam: Vitals:   01/17/17 1835 01/17/17 2152 01/18/17 0031  BP: 183/85 192/92 173/89  Pulse: 71 70 69  Resp: _0 Temp: 98.5 F (36.9 C) 98.2 F (36.8 C) 98.5 F (36.9 C)  TempSrc: Oral Oral Oral  SpO2: 100% 100% 94%     General:  Appears calm and comfortable and is NAD Eyes:  PERRL on right, left eye with chronic film from chemical injury, EOMI, normal lids, iris ENT: grossly normal hearing, lips & tongue, mmm Neck:  no LAD, masses or thyromegaly Cardiovascular:  RRR, no m/r/g. No LE edema.  Respiratory:  CTA bilaterally, no w/r/r. Normal respiratory  effort. Abdomen:  soft, ntnd, NABS Skin:  Diffuse venous stasis with stasis dermatitis of lower extremities; there is cracking and excoriation of both feet with significant scaling.  The left great toe is s/p amputation and on the dorsum of the left foot adjacent to the base of the toes there is an excoriated area.  There is edema of the left foot extending up the left leg and the lateral aspect of the lower leg is taut and shiny  Musculoskeletal:  grossly normal tone BUE/BLE, good ROM, no bony abnormality Psychiatric:  grossly normal mood and affect, speech fluent and appropriate, AOx3 Neurologic:  CN 2-12 grossly intact, moves all extremities in coordinated fashion, sensation diminished in bilateral feet  Labs on Admission: I have personally reviewed following labs and imaging studies  CBC:  Recent Labs Lab 01/17/17 1903  WBC 5.3  NEUTROABS 3.8  HGB 14.4  HCT 42.8  MCV 92.4  PLT 578   Basic Metabolic Panel:  Recent Labs Lab 01/17/17 1903  NA 140  K 4.7  CL 104  CO2 28  GLUCOSE 124*  BUN 23*  CREATININE 1.43*  CALCIUM 9.5   GFR: Estimated Creatinine Clearance: 34.9 mL/min (by C-G formula based on SCr of 1.43 mg/dL (H)). Liver Function Tests:  Recent Labs Lab 01/17/17 1903  AST 19  ALT 13*  ALKPHOS 59  BILITOT 1.0  PROT 7.6  ALBUMIN 4.3   No  results for input(s): LIPASE, AMYLASE in the last 168 hours. No results for input(s): AMMONIA in the last 168 hours. Coagulation Profile: No results for input(s): INR, PROTIME in the last 168 hours. Cardiac Enzymes: No results for input(s): CKTOTAL, CKMB, CKMBINDEX, TROPONINI in the last 168 hours. BNP (last 3 results) No results for input(s): PROBNP in the last 8760 hours. HbA1C: No results for input(s): HGBA1C in the last 72 hours. CBG: No results for input(s): GLUCAP in the last 168 hours. Lipid Profile: No results for input(s): CHOL, HDL, LDLCALC, TRIG, CHOLHDL, LDLDIRECT in the last 72 hours. Thyroid Function Tests: No results for input(s): TSH, T4TOTAL, FREET4, T3FREE, THYROIDAB in the last 72 hours. Anemia Panel: No results for input(s): VITAMINB12, FOLATE, FERRITIN, TIBC, IRON, RETICCTPCT in the last 72 hours. Urine analysis:    Component Value Date/Time   COLORURINE YELLOW 02/13/2012 1401   APPEARANCEUR CLEAR 02/13/2012 1401   LABSPEC 1.011 02/13/2012 1401   PHURINE 6.5 02/13/2012 1401   GLUCOSEU NEGATIVE 02/13/2012 1401   HGBUR NEGATIVE 02/13/2012 1401   BILIRUBINUR NEGATIVE 02/13/2012 1401   KETONESUR NEGATIVE 02/13/2012 1401   PROTEINUR NEGATIVE 02/13/2012 1401   UROBILINOGEN 1.0 02/13/2012 1401   NITRITE NEGATIVE 02/13/2012 1401   LEUKOCYTESUR NEGATIVE 02/13/2012 1401    Creatinine Clearance: Estimated Creatinine Clearance: 34.9 mL/min (by C-G formula based on SCr of 1.43 mg/dL (H)).  Sepsis Labs: _0 (procalcitonin:4,lacticidven:4) )No results found for this or any previous visit (from the past 240 hour(s)).   Radiological Exams on Admission: Dg Foot Complete Left  Result Date: 01/18/2017 CLINICAL DATA:  Inflamed foot painful and swollen EXAM: LEFT FOOT - COMPLETE 3+ VIEW COMPARISON:  MRI 06/03/2015 FINDINGS: Diffuse osteopenia is present. Patient is status post amputation of the tuft of the first distal phalanx. No gross periostitis or cortical bone  destruction. Diffuse soft tissue swelling. No soft tissue gas collections. Mild vascular calcifications. Small plantar spur. IMPRESSION: 1. Diffuse soft tissue swelling and osteopenia. No obvious bony destructive process or periosteal reaction. 2. Patient is status post amputation of the tuft of the first  distal phalanx. Electronically Signed   By: Donavan Foil M.D.   On: 01/18/2017 00:23    EKG: not done  Assessment/Plan Principal Problem:   Cellulitis Active Problems:   Peripheral vascular disease (HCC)   Venous stasis dermatitis   Wound, open, toe   Pertinent labs: Lactate 1.10, repeat 2.38 Glucose 124 Creatinine 1.43, stable  -Patient without known h/o DM but with chronic fungal foot infections and 1 prior significant bacterial infection that led to amputation of his left great toe (tip) -His feet today look terrible - both of them have cracking and bleeding and scaling and erythema -He also has marked venous stasis with dermatitis -The left foot has edema and erythema and it is difficult to ascertain how much of this is new -His WBC count is normal - but he is also very elderly -In short, it is difficult to tweeze out which is acute and which is chronic -Will admit for treatment of cellulitis - certainly with the swelling and erythema and obvious skin breakdown, cellulitis is distinctly possible -Cellulitis and diabetic foot ulcer (even though not known to be diabetic) order sets utilized -He does have hyperglycemia today and so will check A1c -Will request wound care consultation for assistance with this challenge -Will order ABIs -With this current evaluation and treatment plan, if he improves/is stable over the next few days, it would be reasonable to discharge him on PO antibiotics and possibly also antifungals -If he does not improve, he likely needs an MRI to r/o deeper infection including osteomyelitis -I discussed this plan with the patient and his 2 sons and they were  all in agreement with this   DVT prophylaxis: Lovenox  Code Status: DNR - confirmed with patient/family Family Communication: Sons present throughout evaluation Disposition Plan:  Home once clinically improved Consults called:  CM, wound care Admission status: Admit - It is my clinical opinion that admission to INPATIENT is reasonable and necessary because this patient will require at least 2 midnights in the hospital to treat this condition based on the medical complexity of the problems presented.  Given the aforementioned information, the predictability of an adverse outcome is felt to be significant.    Karmen Bongo MD [facility]  If 7PM-7AM, please contact night-coverage [email or web address] Password [email or web address]  01/18/2017, 1:38 AM

## 2017-01-18 NOTE — ED Notes (Signed)
Gave report to [employer].

## 2017-01-18 NOTE — Consult Note (Signed)
[facility] Nurse wound consult note Reason for Consult: Advanced presentation of tinea pedis in the interdigial spaces bilaterally.  L>R Wound type:infectious (fungal overgrowth) Pressure Injury POA:No Measurement: Interdigital fissures between Right toes #1 and 2, 2 and 3.  Left toes # 1 and 2, 2 and 3 and 3 and 4. Wound bed:red, moist Drainage (amount, consistency, odor) serous Periwound:erythematous, dried serum.  Dry, cracked skin on LEs. Dressing procedure/placement/frequency: I have providd Nursing with conservative orders for twice daily wound care using an antimicrobial wicking material.  Patient could also benefit from dosing with oral or IV antifungal (eg., diflucan) either once or twice .  If you agree, please order. [facility] nursing team will not follow, but will remain available to this patient, the nursing and medical teams.  Please re-consult if needed. Thanks, Maudie Flakes, MSN, RN, [place], Arther Abbott  Pager# [phone]

## 2017-01-18 NOTE — Progress Notes (Signed)
Initial Nutrition Assessment  DOCUMENTATION CODES:   Not applicable  INTERVENTION:   Ensure Enlive po BID, each supplement provides 350 kcal and 20 grams of protein  MVI  Soft Diet as patient does not have any teeth  NUTRITION DIAGNOSIS:   Increased nutrient needs related to wound healing as evidenced by increased estimated needs from protein.  GOAL:   Patient will meet greater than or equal to 90% of their needs  MONITOR:   PO intake, Supplement acceptance, Skin  REASON FOR ASSESSMENT:   Consult Wound healing  ASSESSMENT:   83 y.o. male with medical history significant of PVD, hypothyroidism, HTN, non-hodgkins lymphoma in remission, and amputation of left great toe from non-healing wound.  He presents because he is having problems with his left toe   Met with pt in room today, pt reports good appetite pta and currently eating 100% meals. Per chart, pt is weight stable. RD discussed with pt the importance of adequate protein intake or wound healing. Pt is willing to try Ensure. Pt reports that he has trouble chewing the foods because he does not have any teeth. RD will change diet to DYS 3/thin.   Medications reviewed and include: aspirin, ceftriaxone, colace, lovenox, synthroid  Labs reviewed: BUN 22(H), creat 1.36(H), pre-albumin 14.7(L), CRP 4.5(H) Wbc- 3.7(L)  Nutrition-Focused physical exam completed. Findings are no fat depletion, no muscle depletion, and mild edema in bilateral lower extremtities.   Diet Order:  Diet regular Room service appropriate? Yes; Fluid consistency: Thin  Skin:  Wound (see comment) (toe wounds)  Last BM:  2/20  Height:   Ht Readings from Last 1 Encounters:  01/18/17 _0  (1.854 m)    Weight:   Wt Readings from Last 1 Encounters:  01/18/17 206 lb (93.4 kg)    Ideal Body Weight:  83.6 kg  BMI:  Body mass index is 27.18 kg/m.  Estimated Nutritional Needs:   Kcal:  2100-2500kcal/day   Protein:  103-122g/day   Fluid:   >2L/day   EDUCATION NEEDS:   No education needs identified at this time  Koleen Distance, RD, LDN Pager #[phone]

## 2017-01-18 NOTE — Progress Notes (Signed)
At 15:30, attempted to apply wound care and dressings, but patient is finally sleeping after being awake for 24 hours, daughter asked for me to come back once he is awake.

## 2017-01-18 NOTE — Telephone Encounter (Signed)
Pt has been hospitalized, due to infection in toe. [name] called to inform us and cancel appt.. For next week.

## 2017-01-19 DIAGNOSIS — I739 Peripheral vascular disease, unspecified: Secondary | ICD-10-CM

## 2017-01-19 DIAGNOSIS — L03116 Cellulitis of left lower limb: Principal | ICD-10-CM

## 2017-01-19 DIAGNOSIS — I872 Venous insufficiency (chronic) (peripheral): Secondary | ICD-10-CM

## 2017-01-19 DIAGNOSIS — I998 Other disorder of circulatory system: Secondary | ICD-10-CM

## 2017-01-19 DIAGNOSIS — S91109A Unspecified open wound of unspecified toe(s) without damage to nail, initial encounter: Secondary | ICD-10-CM

## 2017-01-19 LAB — BASIC METABOLIC PANEL
Anion gap: 5 (ref 5–15)
BUN: 22 mg/dL — ABNORMAL HIGH (ref 6–20)
CO2: 27 mmol/L (ref 22–32)
Calcium: 8.9 mg/dL (ref 8.9–10.3)
Chloride: 107 mmol/L (ref 101–111)
Creatinine, Ser: 1.17 mg/dL (ref 0.61–1.24)
GFR calc Af Amer: 59 mL/min — ABNORMAL LOW (ref >60)
GFR calc non Af Amer: 51 mL/min — ABNORMAL LOW (ref >60)
Glucose, Bld: 104 mg/dL — ABNORMAL HIGH (ref 65–99)
Potassium: 4.9 mmol/L (ref 3.5–5.1)
Sodium: 139 mmol/L (ref 135–145)

## 2017-01-19 LAB — HEMOGLOBIN A1C
Hgb A1c MFr Bld: 5.4 % (ref 4.8–5.6)
Mean Plasma Glucose: 108

## 2017-01-19 NOTE — Progress Notes (Signed)
[facility]  PROGRESS NOTE  [name] FGH:[id number] DOB: October 03, 1919 DOA: 01/17/2017 PCP: Odette Fraction, MD   Brief HPI:     83 y.o. male with medical history significant of PVD, hypothyroidism, HTN, non-hodgkins lymphoma in remission, and amputation of left great toe from non-healing wound.  He presents because he is having problems with his left toe.  Foot has been hurting.  Trouble for several years.  Has had wraps, wears support hose.  Sees [facility].  Scaly at one point.  Got infection in left foot at one point and needed amputation.  Had athlete's foot, took PO Lamisil over the summer.  Seemed to come back when he ran out of Lamisil.  Washing them every night.  ?irritating left foot.  Dries them as directed.  The last few days, pain in left toe area and up the foot.  Saw podiatry, concern for infection.  Started Keflex and told him to return if it worsened.  The past started last night.  He been in significant pain in toe and foot through the day today.  They saw Dr. Dennard Schaumann and he was concerned about infection and so thought he needed hospitalization and antibiotics.  No fevers.  Does have some chronic pain along the left lower leg.  Pain has been present, but the skin cracked open this week and this is new.  Toes swelled up like they would burst and then the cracked open the next day.     Subjective   Patient denies pain this morning. ABI showed moderate reduction in arterial flow in left.   Assessment/Plan:     1. Cellulitis/Diabetic foot ulcer- Patient started on Rocephin and Flagyl. Slowly improving, wound care consulted. Blood cultures 2 negative to date. 2. Peripheral vascular disease- ABI confirms moderate reduction in blood flow in the left lower extremity. Patient has known  history of PAD. We will consult physical surgery for further recommendations. 3. Hypothyroidism-continue Synthroid 4. Hypertension-continue Vasotec 20 mg by mouth daily   DVT  prophylaxis: Lovenox  Code Status: DO NOT RESUSCITATE  Family Communication: No family at bedside  Disposition Plan: Home when medically stable   Consultants:  None   Procedures:  ABI   Antibiotics:   Anti-infectives    Start     Dose/Rate Route Frequency Ordered Stop   01/18/17 0600  metroNIDAZOLE (FLAGYL) tablet 500 mg     500 mg Oral Every 8 hours 01/18/17 0248     01/18/17 0248  cefTRIAXone (ROCEPHIN) 2 g in dextrose 5 % 50 mL IVPB     2 g 100 mL/hr over 30 Minutes Intravenous Every 24 hours 01/18/17 0248     01/18/17 0045  vancomycin (VANCOCIN) IVPB 1000 mg/200 mL premix     1,000 mg 200 mL/hr over 60 Minutes Intravenous  Once 01/18/17 0035 01/18/17 0220       Objective   Vitals:   01/18/17 0858 01/18/17 1334 01/18/17 2237 01/19/17 0541  BP: (!) 172/84 (!) 163/79 (!) 151/87 (!) 148/80  Pulse:  74 77 70  Resp:  16 16 16   Temp:  97.8 F (36.6 C) 98.6 F (37 C) 98.1 F (36.7 C)  TempSrc:  Oral Oral Oral  SpO2:  97% 97% 97%  Weight:      Height:        Intake/Output Summary (Last 24 hours) at 01/19/17 1312 Last data filed at 01/19/17 0541  Gross per 24 hour  Intake  410 ml  Output              350 ml  Net               60 ml   Filed Weights   01/18/17 0256  Weight: 93.4 kg (206 lb)     Physical Examination:  General exam: Appears calm and comfortable. Respiratory system: Clear to auscultation. Respiratory effort normal. Cardiovascular system:  RRR. No  murmurs, rubs, gallops. No pedal edema. GI system: Abdomen is nondistended, soft and nontender. No organomegaly.  Central nervous system. No focal neurological deficits. 5 x 5 power in all extremities. Skin: Bilateral erythema noted in lower extremities Psychiatry: Alert, oriented x 3.Judgement and insight appear normal. Affect normal.    Data Reviewed: I have personally reviewed following labs and imaging studies  CBG: No results for input(s): GLUCAP in the last 168  hours.  CBC:  Recent Labs Lab 01/17/17 1903 01/18/17 0500  WBC 5.3 3.7*  NEUTROABS 3.8  --   HGB 14.4 12.7*  HCT 42.8 38.3*  MCV 92.4 90.1  PLT 153 145*    Basic Metabolic Panel:  Recent Labs Lab 01/17/17 1903 01/18/17 0500 01/19/17 0427  NA 140 140 139  K 4.7 3.9 4.9  CL 104 108 107  CO2 28 27 27   GLUCOSE 124* 129* 104*  BUN 23* 22* 22*  CREATININE 1.43* 1.36* 1.17  CALCIUM 9.5 9.1 8.9    No results found for this or any previous visit (from the past 240 hour(s)).   Liver Function Tests:  Recent Labs Lab 01/17/17 1903  AST 19  ALT 13*  ALKPHOS 59  BILITOT 1.0  PROT 7.6  ALBUMIN 4.3       Studies: Dg Foot Complete Left  Result Date: 01/18/2017 CLINICAL DATA:  Inflamed foot painful and swollen EXAM: LEFT FOOT - COMPLETE 3+ VIEW COMPARISON:  MRI 06/03/2015 FINDINGS: Diffuse osteopenia is present. Patient is status post amputation of the tuft of the first distal phalanx. No gross periostitis or cortical bone destruction. Diffuse soft tissue swelling. No soft tissue gas collections. Mild vascular calcifications. Small plantar spur. IMPRESSION: 1. Diffuse soft tissue swelling and osteopenia. No obvious bony destructive process or periosteal reaction. 2. Patient is status post amputation of the tuft of the first distal phalanx. Electronically Signed   By: Donavan Foil M.D.   On: 01/18/2017 00:23    Scheduled Meds: . aspirin EC  81 mg Oral Daily  . cefTRIAXone (ROCEPHIN)  IV  2 g Intravenous Q24H   And  . metroNIDAZOLE  500 mg Oral Q8H  . docusate sodium  100 mg Oral BID  . enalapril  20 mg Oral Daily  . enoxaparin (LOVENOX) injection  40 mg Subcutaneous Q24H  . feeding supplement (ENSURE ENLIVE)  237 mL Oral BID BM  . gabapentin  200 mg Oral q morning - 10a  . gabapentin  300 mg Oral QHS  . levothyroxine  88 mcg Oral Daily  . multivitamin with minerals  1 tablet Oral Daily      Time spent: 25 min  [facility] Pager  [phone]. If 7PM-7AM, please contact night-coverage at [email or web address], Office  [phone]  password [email or web address] 01/19/2017, 1:12 PM  LOS: 1 day

## 2017-01-19 NOTE — Progress Notes (Signed)
Pt c/o burning upon urination.  Sample taken in case physician wants to order a urine test

## 2017-01-19 NOTE — Consult Note (Signed)
Hospital Consult    Reason for Consult:  Cellulitis with decreased abi's Referring Physician:  Darrick Meigs MRN #:  [id number]  History of Present Illness: This is a 83 y.o. male admitted with cellulitis of left foot that was worsening for a few days prior to admission on 01/17/17. He is followed by Dr. Jacqualyn Posey and was evaluated earlier this week. Also has previous history of left toe amputation. Now has improving cellulitis of his left leg with antibiotics. Associated edema that is also much improved since hospitalization. No exacerbating or alleviating factors to wounds. Denies any fevers although legs were warm prior to admission. Has chronic pain in his bilateral legs, continues to walk with help of walker. Not systemically ill and eating well at time of interview.   Past Medical History:  Diagnosis Date  . Acid reflux   . Cancer ([place]) 09/05/13   left neck-non-hodgkins lymphoma  . Hypertension    requires diuretic  . Hypothyroidism   . Malaria    while in the service  . Peripheral vascular disease ([place])    slow wound healing on legs    Past Surgical History:  Procedure Laterality Date  . AMPUTATION Left 06/25/2015   Procedure: LEFT  HALLUX AMPUTATION,;  Surgeon: Wylene Simmer, MD;  Location: [facility];  Service: Orthopedics;  Laterality: Left;  LOCAL/MAC  . MASS BIOPSY Left 09/05/2013   Procedure: EXCISIONAL BIOPSY OF LEFT NECK MASS;  Surgeon: Jerrell Belfast, MD;  Location: [facility];  Service: ENT;  Laterality: Left;  . TONSILLECTOMY    . YAG LASER APPLICATION Right 03/29/7740   Procedure: YAG LASER APPLICATION;  Surgeon: [name], MD;  Location: [facility];  Service: Ophthalmology;  Laterality: Right;    Allergies  Allergen Reactions  . Codeine Nausea And Vomiting  . Hydrocodone Nausea And Vomiting  . Percocet [Oxycodone-Acetaminophen] Nausea And Vomiting    Prior to Admission medications   Medication Sig Start Date End Date Taking? Authorizing Provider  aspirin EC 81  MG tablet Take 1 tablet (81 mg total) by mouth daily. 09/05/13  Yes Jerrell Belfast, MD  cephALEXin (KEFLEX) 500 MG capsule Take 1 capsule (500 mg total) by mouth 3 (three) times daily. 01/16/17  Yes Trula Slade, DPM  enalapril (VASOTEC) 20 MG tablet TAKE 1 TABLET BY MOUTH  EVERY DAY 09/26/16  Yes Susy Frizzle, MD  furosemide (LASIX) 20 MG tablet TAKE 1 TABLET BY MOUTH  EVERY DAY 09/26/16  Yes Susy Frizzle, MD  gabapentin (NEURONTIN) 100 MG capsule Take 2 capsules every morning 11/03/16  Yes Max T Hyatt, DPM  gabapentin (NEURONTIN) 300 MG capsule Take 1 capsule (300 mg total) by mouth at bedtime. 11/03/16  Yes Max T Hyatt, DPM  levothyroxine (SYNTHROID, LEVOTHROID) 88 MCG tablet TAKE 1 TABLET BY MOUTH  EVERY DAY 09/26/16  Yes Susy Frizzle, MD  NONFORMULARY OR COMPOUNDED [place]:  Peripheral Neuropathy cream - Bupivacaine 1%, Doxepin 3%, Gabapentin 6%, Pentoxifylline 3%, Topiramate 1%, apply 1-2 grams to affected area 3-4 times daily. 05/03/16  Yes Max T Hyatt, DPM  triamcinolone cream (KENALOG) 0.1 % APPLY TO AFFECTED AREA TWICE A DAY FOR 2-4 WEEKS AS NEEDED FOR FLARES 04/18/16  Yes Historical Provider, MD  vitamin B-12 (CYANOCOBALAMIN) 1000 MCG tablet Take 1 tablet (1,000 mcg total) by mouth daily. 05/01/15  Yes Susy Frizzle, MD    Social History   Social History  . Marital status: Married    Spouse name: N/A  . Number of  children: 4  . Years of education: N/A   Occupational History  . retired    Social History Main Topics  . Smoking status: Former Smoker    Types: Cigars  . Smokeless tobacco: Former Systems developer    Quit date: 10/05/1979  . Alcohol use No  . Drug use: No  . Sexual activity: Not on file   Other Topics Concern  . Not on file   Social History Narrative  . No narrative on file     History reviewed. No pertinent family history.  ROS: _0  Positive   _1  Negative   _2  All sytems reviewed and are negative  Cardiovascular: _3  chest  pain/pressure _4  palpitations _5  SOB lying flat _6  DOE _7  pain in legs while walking _8  pain in legs at rest _9  pain in legs at night _10  non-healing ulcers _11  hx of DVT _12  swelling in legs  Pulmonary: _13  productive cough _14  asthma/wheezing _15  home O2  Neurologic: _16  weakness in _17  arms _18  legs _19  numbness in _20  arms _21  legs _22  hx of CVA _23  mini stroke _24 difficulty speaking or slurred speech _25  temporary loss of vision in one eye _26  dizziness  Hematologic: _27  hx of cancer _28  bleeding problems _29  problems with blood clotting easily  Endocrine:   _30  diabetes _31  thyroid disease  GI _32  vomiting blood _33  blood in stool  GU: _34  CKD/renal failure _35  HD--_36  M/W/F or _37  T/T/S _38  burning with urination _39  blood in urine  Psychiatric: _40  anxiety _41  depression  Musculoskeletal: _42  arthritis _43  joint pain  Integumentary: _44  rashes _45  ulcers  Constitutional: _46  fever _47  chills   Physical Examination  Vitals:   01/19/17 0541 01/19/17 1438  BP: (!) 148/80 (!) 167/83  Pulse: 70 69  Resp: 16 16  Temp: 98.1 F (36.7 C) 97.9 F (36.6 C)   Body mass index is 27.18 kg/m.  General:  WDWN in NAD HENT: WNL, normocephalic Pulmonary: normal non-labored breathing Cardiac: palpable bilateral femoral and popliteal pulses, monophasic dp/pt/peroneal signals bilaterally Abdomen: soft, NT/ND, no masses Skin: changes suggestive of c4 venous disease Extremities: no edema, bilateral toes have cracked skin without cellulitis at this time, distal left great toe has been amputated Musculoskeletal: no muscle wasting or atrophy  Neurologic: A&O X 3; Appropriate Affect ; SENSATION: normal; MOTOR FUNCTION:  moving all extremities equally. Speech is fluent/normal   CBC    Component Value Date/Time   WBC 3.7 (L) 01/18/2017 0500   RBC 4.25 01/18/2017 0500   HGB 12.7 (L) 01/18/2017 0500   HGB 13.7 04/08/2014 0957   HCT 38.3 (L) 01/18/2017 0500   HCT 40.7 04/08/2014  0957   PLT 145 (L) 01/18/2017 0500   PLT 142 04/08/2014 0957   MCV 90.1 01/18/2017 0500   MCV 92.8 04/08/2014 0957   MCH 29.9 01/18/2017 0500   MCHC 33.2 01/18/2017 0500   RDW 13.6 01/18/2017 0500   RDW 14.0 04/08/2014 0957   LYMPHSABS 0.7 01/17/2017 1903   LYMPHSABS 1.2 04/08/2014 0957   MONOABS 0.6 01/17/2017 1903   MONOABS 0.5 04/08/2014 0957   EOSABS 0.2 01/17/2017 1903   EOSABS 0.1 04/08/2014 0957   BASOSABS 0.0 01/17/2017 1903   BASOSABS 0.0 04/08/2014 0957    BMET    Component Value Date/Time   NA 139 01/19/2017 0427   NA 146 (H) 04/08/2014 0957   K 4.9 01/19/2017 0427   K 4.2 04/08/2014 0957   CL 107 01/19/2017 0427   CO2 27 01/19/2017 0427  CO2 26 04/08/2014 0957   GLUCOSE 104 (H) 01/19/2017 0427   GLUCOSE 91 04/08/2014 0957   BUN 22 (H) 01/19/2017 0427   BUN 17.5 04/08/2014 0957   CREATININE 1.17 01/19/2017 0427   CREATININE 1.29 11/17/2014 0959   CREATININE 1.4 (H) 04/08/2014 0957   CALCIUM 8.9 01/19/2017 0427   CALCIUM 9.6 04/08/2014 0957   GFRNONAA 51 (L) 01/19/2017 0427   GFRNONAA 47 (L) 11/17/2014 0959   GFRAA 59 (L) 01/19/2017 0427   GFRAA 55 (L) 11/17/2014 0959    COAGS: Lab Results  Component Value Date   INR 1.00 08/12/2013     Non-Invasive Vascular Imaging:   Summary:  - Right ABI indicates normal arterial flow at rest. Doppler   waveforms may suggest a falsely elevated pressure. - Left ABI indicates a moderate reduction in arterial flow at rest.   Doppler waveforms may suggest a possible elevation in pressures.  IMPRESSION: 1. Diffuse soft tissue swelling and osteopenia. No obvious bony destructive process or periosteal reaction. 2. Patient is status post amputation of the tuft of the first distal phalanx.   ASSESSMENT/PLAN: This is a 83 y.o. male admitted with cellulitis and wounds of his biltateral toes, left greater than right with depressed abi bilaterally and associated objective evidence of chronic venous disease.  Discussed with family and it seems his symptoms are improving. He would benefit from angiogram to evaluate for possible improvement of his arterial flow to his legs to aid in wound healing but he is doing well for now and so will f/u in a few weeks for further evaluation and possible planning angiogram. He should continue aspirin and be considered for statin therapy. If wounds get worse will plan angiogram otherwise f/u to discuss. Office to contact.   Terica Yogi C. Donzetta Matters, MD [facility] Office: [phone] Pager: [phone]

## 2017-01-19 NOTE — Evaluation (Signed)
Physical Therapy Evaluation Patient Details Name: [name] MRN: [id number] DOB: 19-Apr-1919 Today's Date: 01/19/2017   History of Present Illness  83 y.o. male with medical history significant of PVD, hypothyroidism, HTN, non-hodgkins lymphoma in remission, and amputation of left great toe from non-healing wound.  Pt admitted with L foot pain. Dx of cellulits  Clinical Impression  Pt admitted with above diagnosis. Pt currently with functional limitations due to the deficits listed below (see PT Problem List). Pt ambulated 260' with RW with mild loss of balance x 2. Close supervision for mobility recommended due to fall risk, family able to provide this.  Pt will benefit from skilled PT to increase their independence and safety with mobility to allow discharge to the venue listed below.       Follow Up Recommendations Home health PT; supervision for mobility    Equipment Recommendations  Other (comment) (B postop shoes)    Recommendations for Other Services       Precautions / Restrictions Precautions Precautions: Fall Precaution Comments: 1 fall in past 1 year Restrictions Weight Bearing Restrictions: No Other Position/Activity Restrictions: WBAT      Mobility  Bed Mobility Overal bed mobility: Modified Independent             General bed mobility comments: HOB up 30*, used rail  Transfers Overall transfer level: Needs assistance Equipment used: Rolling walker (2 wheeled) Transfers: Sit to/from Stand Sit to Stand: Min assist         General transfer comment: min A to steady due to posterior LOB initially upon standing  Ambulation/Gait Ambulation/Gait assistance: Min assist;Min guard Ambulation Distance (Feet): 260 Feet Assistive device: Rolling walker (2 wheeled) Gait Pattern/deviations: Step-through pattern;Decreased step length - left   Gait velocity interpretation: at or above normal speed for age/gender General Gait Details: mild LOB x 2 requiring min  A, pt weightbears on his heels 2* pain  Stairs            Wheelchair Mobility    Modified Rankin (Stroke Patients Only)       Balance Overall balance assessment: Needs assistance;History of Falls   Sitting balance-Leahy Scale: Good       Standing balance-Leahy Scale: Fair                               Pertinent Vitals/Pain Pain Assessment: 0-10 Pain Score: 3  Pain Location: L foot Pain Descriptors / Indicators: Sore Pain Intervention(s): Limited activity within patient's tolerance;Monitored during session;Repositioned    Home Living Family/patient expects to be discharged to:: Private residence Living Arrangements: Spouse/significant other Available Help at Discharge: Friend(s);Available 24 hours/day Type of Home: House Home Access: Stairs to enter Entrance Stairs-Rails: Right Entrance Stairs-Number of Steps: 6 Home Layout: One level Home Equipment: Walker - 2 wheels;Walker - 4 wheels;Shower seat;Toilet riser Additional Comments: 4 children takes turns staying with pt and his wife who has dementia, pt doesn't drive    Prior Function Level of Independence: Independent with assistive device(s)         Comments: walks with RW, independent ADLs     Hand Dominance        Extremity/Trunk Assessment   Upper Extremity Assessment Upper Extremity Assessment: Overall WFL for tasks assessed    Lower Extremity Assessment Lower Extremity Assessment: Overall WFL for tasks assessed (hip/knee/ankle AROM WNL, knee ext 5/5, ankle strength NT 2* pain)    Cervical / Trunk Assessment Cervical / Trunk  Assessment: Normal  Communication   Communication: No difficulties  Cognition Arousal/Alertness: Awake/alert Behavior During Therapy: WFL for tasks assessed/performed                        General Comments      Exercises     Assessment/Plan    PT Assessment Patient needs continued PT services  PT Problem List Decreased activity  tolerance;Decreased balance;Pain;Decreased mobility       PT Treatment Interventions DME instruction;Gait training;Stair training;Functional mobility training;Balance training;Therapeutic exercise;Therapeutic activities;Patient/family education    PT Goals (Current goals can be found in the Care Plan section)  Acute Rehab PT Goals Patient Stated Goal: return home, improve balance, likes to cook breakfast PT Goal Formulation: With patient/family Time For Goal Achievement: 02/02/17 Potential to Achieve Goals: Good    Frequency Min 3X/week   Barriers to discharge        Co-evaluation               End of Session Equipment Utilized During Treatment: Gait belt Activity Tolerance: Patient tolerated treatment well Patient left: in chair;with call bell/phone within reach;with family/visitor present Nurse Communication: Mobility status PT Visit Diagnosis: Unsteadiness on feet (R26.81);Difficulty in walking, not elsewhere classified (R26.2);Pain Pain - Right/Left: Left Pain - part of body: Ankle and joints of foot         Time: 8206-0156 PT Time Calculation (min) (ACUTE ONLY): 29 min   Charges:   PT Evaluation $PT Eval Low Complexity: 1 Procedure PT Treatments $Gait Training: 8-22 mins   PT G Codes:         Philomena Doheny 01/19/2017, 2:33 PM [phone]

## 2017-01-20 ENCOUNTER — Telehealth: Payer: Self-pay | Admitting: Vascular Surgery

## 2017-01-20 MED ORDER — FLUCONAZOLE 100 MG PO TABS
100.0000 mg | ORAL_TABLET | Freq: Every day | ORAL | Status: DC
  Administered 2017-01-20: 100 mg via ORAL
  Filled 2017-01-20: qty 1

## 2017-01-20 MED ORDER — FLUCONAZOLE 100 MG PO TABS: 100.0000 mg | ORAL_TABLET | Freq: Every day | ORAL | 0 refills | Status: DC

## 2017-01-20 MED ORDER — CEPHALEXIN 500 MG PO CAPS: 500.0000 mg | ORAL_CAPSULE | Freq: Two times a day (BID) | ORAL | 0 refills | Status: DC

## 2017-01-20 MED ORDER — ATORVASTATIN CALCIUM 10 MG PO TABS: 10.0000 mg | ORAL_TABLET | Freq: Every day | ORAL | 2 refills | Status: DC

## 2017-01-20 NOTE — Telephone Encounter (Signed)
-----   Message from Denman George, RN sent at 01/20/2017 10:42 AM EST ----- Regarding: needs 2-3 week f/u with [facility]; discussion of angiogram    ----- Message ----- From: Waynetta Sandy, MD Sent: 01/19/2017   8:59 PM To: Vvs Charge Pool   Previously sent consult but patient also needs to f/u in 2-3 weeks without studies for discussion of angiogram.   RAMONTE MENA  [id number]  13-Oct-1919   Inpatient level 4 consult  Dx: critical limb ischemia with tissue ulceration

## 2017-01-20 NOTE — Telephone Encounter (Signed)
Scheduled Feb 28, 2023 @ 2:45 pm

## 2017-01-20 NOTE — Discharge Summary (Addendum)
Physician Discharge Summary  [name] ZJQ:[id number] DOB: 06-Aug-1919 DOA: 01/17/2017  PCP: Odette Fraction, MD  Admit date: 01/17/2017 Discharge date: 01/20/2017  Time spent: 25* minutes  Recommendations for Outpatient Follow-up:  1. *Follow up Vascular surgery in 2 weeks 2. Will need Lipid profile as outpatient 3. Patient started on Lipitor 10 mg po daily 4. Patient to go home with HHPT, nursing for wound care   Discharge Diagnoses:  Principal Problem:   Cellulitis Active Problems:   Peripheral vascular disease ([place])   Venous stasis dermatitis   Wound, open, toe   Discharge Condition: Stable  Diet recommendation: *Heart healthy diet  Filed Weights   01/18/17 0256  Weight: 93.4 kg (206 lb)    History of present illness:  83 y.o.malewith medical history significant of PVD, hypothyroidism, HTN, non-hodgkins lymphoma in remission, and amputation of left great toe from non-healing wound. He presents because he is having problems with his lefttoe. Foot has been hurting. Trouble for several years. Has had wraps, wears support hose. Sees [facility]. Scaly at one point. Got infection in left foot at one point and needed amputation. Had athlete's foot, took PO Lamisil over the summer. Seemed to come back when he ran out of Lamisil. Washing them every night. ?irritating left foot. Dries them as directed. The last few days, pain in left toe area and up the foot. Saw podiatry, concern for infection. Started Keflex and told him to return if it worsened. The past started last night. He been in significant pain in toe and foot through the day today. They saw Dr. Dennard Schaumann and he was concerned about infection and so thought he needed hospitalization and antibiotics. No fevers. Does have some chronic pain along the left lower leg. Pain has been present, but the skin cracked open this week and this is new. Toes swelled up like they would burst and then the  cracked open the next day.   Hospital Course:  1. Cellulitis/Diabetic foot ulcer- Patient started on Rocephin and Flagyl. Slowly improving, wound care consulted. Blood cultures 2 negative to date. Will discharge home on Po Keflex 500 mg BID x 7 more days. Also added Diflucan 100 mg po daily x 7 days. 2. Peripheral vascular disease- ABI confirms moderate reduction in blood flow in the left lower extremity. Patient has known  history of PAD. Vascular surgery was consulted and recommended aspirin with statins, follow up in clinic for angiogram as outpatient. Will continue aspirin 81 mg po daily and Lipitor 10 mg po daily. 3. Hypothyroidism-continue Synthroid 4. Hypertension-continue Vasotec 20 mg by mouth daily  Procedures:  None   Consultations:  Vascular surgery  Discharge Exam: Vitals:   01/19/17 2123 01/20/17 0639  BP: (!) 168/87 (!) 145/82  Pulse: 72 67  Resp: 16 16  Temp: 98.3 F (36.8 C) 98.1 F (36.7 C)    General: Appears in no acute distress Cardiovascular: RRR, S1S2 Respiratory: Clear bilaterally Ext - very minimal erythema on both feet, dry cracked skin  Discharge Instructions   Discharge Instructions    Diet - low sodium heart healthy    Complete by:  As directed    Increase activity slowly    Complete by:  As directed      Current Discharge Medication List    START taking these medications   Details  fluconazole (DIFLUCAN) 100 MG tablet Take 1 tablet (100 mg total) by mouth daily. Qty: 7 tablet, Refills: 0    Lipitor 10 mg po  daily    CONTINUE these medications which have CHANGED   Details  cephALEXin (KEFLEX) 500 MG capsule Take 1 capsule (500 mg total) by mouth 2 (two) times daily. Qty: 14 capsule, Refills: 0      CONTINUE these medications which have NOT CHANGED   Details  aspirin EC 81 MG tablet Take 1 tablet (81 mg total) by mouth daily.    enalapril (VASOTEC) 20 MG tablet TAKE 1 TABLET BY MOUTH  EVERY DAY Qty: 90 tablet, Refills: 1     furosemide (LASIX) 20 MG tablet TAKE 1 TABLET BY MOUTH  EVERY DAY Qty: 90 tablet, Refills: 1    !! gabapentin (NEURONTIN) 100 MG capsule Take 2 capsules every morning Qty: 180 capsule, Refills: 4    !! gabapentin (NEURONTIN) 300 MG capsule Take 1 capsule (300 mg total) by mouth at bedtime. Qty: 90 capsule, Refills: 4    levothyroxine (SYNTHROID, LEVOTHROID) 88 MCG tablet TAKE 1 TABLET BY MOUTH  EVERY DAY Qty: 90 tablet, Refills: 1    NONFORMULARY OR COMPOUNDED ITEM [facility]:  Peripheral Neuropathy cream - Bupivacaine 1%, Doxepin 3%, Gabapentin 6%, Pentoxifylline 3%, Topiramate 1%, apply 1-2 grams to affected area 3-4 times daily. Qty: 120 each, Refills: 11    triamcinolone cream (KENALOG) 0.1 % APPLY TO AFFECTED AREA TWICE A DAY FOR 2-4 WEEKS AS NEEDED FOR FLARES Refills: 1    vitamin B-12 (CYANOCOBALAMIN) 1000 MCG tablet Take 1 tablet (1,000 mcg total) by mouth daily. Qty: 30 tablet, Refills: 5     !! - Potential duplicate medications found. Please discuss with provider.     Allergies  Allergen Reactions  . Codeine Nausea And Vomiting  . Hydrocodone Nausea And Vomiting  . Percocet [Oxycodone-Acetaminophen] Nausea And Vomiting   Follow-up Information    [facility] Follow up.   Specialty:  Home Health Services Why:  your home health agency will call to schedule visit to your home Contact information: [place] [id number] [phone]            The results of significant diagnostics from this hospitalization (including imaging, microbiology, ancillary and laboratory) are listed below for reference.    Significant Diagnostic Studies: Dg Foot Complete Left  Result Date: 01/18/2017 CLINICAL DATA:  Inflamed foot painful and swollen EXAM: LEFT FOOT - COMPLETE 3+ VIEW COMPARISON:  MRI 06/03/2015 FINDINGS: Diffuse osteopenia is present. Patient is status post amputation of the tuft of the first distal phalanx. No gross periostitis or  cortical bone destruction. Diffuse soft tissue swelling. No soft tissue gas collections. Mild vascular calcifications. Small plantar spur. IMPRESSION: 1. Diffuse soft tissue swelling and osteopenia. No obvious bony destructive process or periosteal reaction. 2. Patient is status post amputation of the tuft of the first distal phalanx. Electronically Signed   By: Donavan Foil M.D.   On: 01/18/2017 00:23    Microbiology: Recent Results (from the past 240 hour(s))  Blood culture (routine x 2)     Status: None (Preliminary result)   Collection Time: 01/17/17 11:21 PM  Result Value Ref Range Status   Specimen Description BLOOD BLOOD RIGHT FOREARM  Final   Special Requests BOTTLES DRAWN AEROBIC AND ANAEROBIC 5ML  Final   Culture   Final    NO GROWTH 1 DAY Performed at [facility], [place] [address], [place], [place] [address]    Report Status PENDING  Incomplete  Blood culture (routine x 2)     Status: None (Preliminary  result)   Collection Time: 01/18/17 12:50 AM  Result Value Ref Range Status   Specimen Description BLOOD RIGHT ANTECUBITAL  Final   Special Requests BOTTLES DRAWN AEROBIC AND ANAEROBIC 5ML  Final   Culture   Final    NO GROWTH 1 DAY Performed at [facility], [place] [address] [place], [place] [address]    Report Status PENDING  Incomplete     Labs: Basic Metabolic Panel:  Recent Labs Lab 01/17/17 1903 01/18/17 0500 01/19/17 0427  NA 140 140 139  K 4.7 3.9 4.9  CL 104 108 107  CO2 28 27 27   GLUCOSE 124* 129* 104*  BUN 23* 22* 22*  CREATININE 1.43* 1.36* 1.17  CALCIUM 9.5 9.1 8.9   Liver Function Tests:  Recent Labs Lab 01/17/17 1903  AST 19  ALT 13*  ALKPHOS 59  BILITOT 1.0  PROT 7.6  ALBUMIN 4.3   CBC:  Recent Labs Lab 01/17/17 1903 01/18/17 0500  WBC 5.3 3.7*  NEUTROABS 3.8  --   HGB 14.4 12.7*  HCT 42.8 38.3*  MCV 92.4 90.1  PLT 153 145*    Signed:  Sheridan Hew S MD.  [facility] 01/20/2017, 10:18 AM

## 2017-01-22 DIAGNOSIS — Z7982 Long term (current) use of aspirin: Secondary | ICD-10-CM | POA: Diagnosis not present

## 2017-01-22 DIAGNOSIS — L97521 Non-pressure chronic ulcer of other part of left foot limited to breakdown of skin: Secondary | ICD-10-CM | POA: Diagnosis not present

## 2017-01-22 DIAGNOSIS — Z792 Long term (current) use of antibiotics: Secondary | ICD-10-CM | POA: Diagnosis not present

## 2017-01-22 DIAGNOSIS — Z8572 Personal history of non-Hodgkin lymphomas: Secondary | ICD-10-CM | POA: Diagnosis not present

## 2017-01-22 DIAGNOSIS — I872 Venous insufficiency (chronic) (peripheral): Secondary | ICD-10-CM | POA: Diagnosis not present

## 2017-01-22 DIAGNOSIS — Z87891 Personal history of nicotine dependence: Secondary | ICD-10-CM | POA: Diagnosis not present

## 2017-01-22 DIAGNOSIS — L03116 Cellulitis of left lower limb: Secondary | ICD-10-CM | POA: Diagnosis not present

## 2017-01-22 DIAGNOSIS — I739 Peripheral vascular disease, unspecified: Secondary | ICD-10-CM | POA: Diagnosis not present

## 2017-01-22 DIAGNOSIS — I10 Essential (primary) hypertension: Secondary | ICD-10-CM | POA: Diagnosis not present

## 2017-01-22 DIAGNOSIS — Z89412 Acquired absence of left great toe: Secondary | ICD-10-CM | POA: Diagnosis not present

## 2017-01-23 LAB — CULTURE, BLOOD (ROUTINE X 2)
Culture: NO GROWTH
Culture: NO GROWTH

## 2017-01-24 ENCOUNTER — Ambulatory Visit: Payer: Medicare Other | Admitting: Podiatry

## 2017-01-24 DIAGNOSIS — I872 Venous insufficiency (chronic) (peripheral): Secondary | ICD-10-CM | POA: Diagnosis not present

## 2017-01-24 DIAGNOSIS — Z7982 Long term (current) use of aspirin: Secondary | ICD-10-CM | POA: Diagnosis not present

## 2017-01-24 DIAGNOSIS — Z792 Long term (current) use of antibiotics: Secondary | ICD-10-CM | POA: Diagnosis not present

## 2017-01-24 DIAGNOSIS — Z8572 Personal history of non-Hodgkin lymphomas: Secondary | ICD-10-CM | POA: Diagnosis not present

## 2017-01-24 DIAGNOSIS — Z89412 Acquired absence of left great toe: Secondary | ICD-10-CM | POA: Diagnosis not present

## 2017-01-24 DIAGNOSIS — Z87891 Personal history of nicotine dependence: Secondary | ICD-10-CM | POA: Diagnosis not present

## 2017-01-24 DIAGNOSIS — L97521 Non-pressure chronic ulcer of other part of left foot limited to breakdown of skin: Secondary | ICD-10-CM | POA: Diagnosis not present

## 2017-01-24 DIAGNOSIS — I739 Peripheral vascular disease, unspecified: Secondary | ICD-10-CM | POA: Diagnosis not present

## 2017-01-24 DIAGNOSIS — L03116 Cellulitis of left lower limb: Secondary | ICD-10-CM | POA: Diagnosis not present

## 2017-01-24 DIAGNOSIS — I10 Essential (primary) hypertension: Secondary | ICD-10-CM | POA: Diagnosis not present

## 2017-01-25 ENCOUNTER — Telehealth: Payer: Self-pay | Admitting: Family Medicine

## 2017-01-25 DIAGNOSIS — S91302D Unspecified open wound, left foot, subsequent encounter: Secondary | ICD-10-CM

## 2017-01-25 DIAGNOSIS — I10 Essential (primary) hypertension: Secondary | ICD-10-CM | POA: Diagnosis not present

## 2017-01-25 DIAGNOSIS — Z8572 Personal history of non-Hodgkin lymphomas: Secondary | ICD-10-CM | POA: Diagnosis not present

## 2017-01-25 DIAGNOSIS — Z792 Long term (current) use of antibiotics: Secondary | ICD-10-CM | POA: Diagnosis not present

## 2017-01-25 DIAGNOSIS — I739 Peripheral vascular disease, unspecified: Secondary | ICD-10-CM | POA: Diagnosis not present

## 2017-01-25 DIAGNOSIS — I872 Venous insufficiency (chronic) (peripheral): Secondary | ICD-10-CM | POA: Diagnosis not present

## 2017-01-25 DIAGNOSIS — Z89412 Acquired absence of left great toe: Secondary | ICD-10-CM | POA: Diagnosis not present

## 2017-01-25 DIAGNOSIS — Z87891 Personal history of nicotine dependence: Secondary | ICD-10-CM | POA: Diagnosis not present

## 2017-01-25 DIAGNOSIS — L03116 Cellulitis of left lower limb: Secondary | ICD-10-CM | POA: Diagnosis not present

## 2017-01-25 DIAGNOSIS — L97521 Non-pressure chronic ulcer of other part of left foot limited to breakdown of skin: Secondary | ICD-10-CM | POA: Diagnosis not present

## 2017-01-25 DIAGNOSIS — Z7982 Long term (current) use of aspirin: Secondary | ICD-10-CM | POA: Diagnosis not present

## 2017-01-25 NOTE — Telephone Encounter (Signed)
Levada Dy from [facility] called and wanted to know if we could refer the pt to a wound clinic in [place] for his foot. OK the do referral?   Her CB# [phone]

## 2017-01-26 DIAGNOSIS — I10 Essential (primary) hypertension: Secondary | ICD-10-CM | POA: Diagnosis not present

## 2017-01-26 DIAGNOSIS — L97909 Non-pressure chronic ulcer of unspecified part of unspecified lower leg with unspecified severity: Secondary | ICD-10-CM | POA: Diagnosis not present

## 2017-01-26 DIAGNOSIS — Z792 Long term (current) use of antibiotics: Secondary | ICD-10-CM | POA: Diagnosis not present

## 2017-01-26 DIAGNOSIS — Z7982 Long term (current) use of aspirin: Secondary | ICD-10-CM | POA: Diagnosis not present

## 2017-01-26 DIAGNOSIS — Z87891 Personal history of nicotine dependence: Secondary | ICD-10-CM | POA: Diagnosis not present

## 2017-01-26 DIAGNOSIS — Z89412 Acquired absence of left great toe: Secondary | ICD-10-CM | POA: Diagnosis not present

## 2017-01-26 DIAGNOSIS — L03116 Cellulitis of left lower limb: Secondary | ICD-10-CM | POA: Diagnosis not present

## 2017-01-26 DIAGNOSIS — I872 Venous insufficiency (chronic) (peripheral): Secondary | ICD-10-CM | POA: Diagnosis not present

## 2017-01-26 DIAGNOSIS — I739 Peripheral vascular disease, unspecified: Secondary | ICD-10-CM | POA: Diagnosis not present

## 2017-01-26 DIAGNOSIS — Z8572 Personal history of non-Hodgkin lymphomas: Secondary | ICD-10-CM | POA: Diagnosis not present

## 2017-01-26 DIAGNOSIS — L97521 Non-pressure chronic ulcer of other part of left foot limited to breakdown of skin: Secondary | ICD-10-CM | POA: Diagnosis not present

## 2017-01-26 NOTE — Telephone Encounter (Signed)
Home Health nurse feels wound needs to be assessed by [facility] to either take over care or advise [facility] nurse as to care needed.  Called [facility] wound care and they can see pt Monday at 8 AM.  Called and spoke to daughter [name] May.  She to call them and confirm appt for Monday.

## 2017-01-26 NOTE — Telephone Encounter (Signed)
sure

## 2017-01-27 DIAGNOSIS — Z87891 Personal history of nicotine dependence: Secondary | ICD-10-CM | POA: Diagnosis not present

## 2017-01-27 DIAGNOSIS — Z8572 Personal history of non-Hodgkin lymphomas: Secondary | ICD-10-CM | POA: Diagnosis not present

## 2017-01-27 DIAGNOSIS — Z792 Long term (current) use of antibiotics: Secondary | ICD-10-CM | POA: Diagnosis not present

## 2017-01-27 DIAGNOSIS — Z89412 Acquired absence of left great toe: Secondary | ICD-10-CM | POA: Diagnosis not present

## 2017-01-27 DIAGNOSIS — I872 Venous insufficiency (chronic) (peripheral): Secondary | ICD-10-CM | POA: Diagnosis not present

## 2017-01-27 DIAGNOSIS — I10 Essential (primary) hypertension: Secondary | ICD-10-CM | POA: Diagnosis not present

## 2017-01-27 DIAGNOSIS — Z7982 Long term (current) use of aspirin: Secondary | ICD-10-CM | POA: Diagnosis not present

## 2017-01-27 DIAGNOSIS — L97521 Non-pressure chronic ulcer of other part of left foot limited to breakdown of skin: Secondary | ICD-10-CM | POA: Diagnosis not present

## 2017-01-27 DIAGNOSIS — I739 Peripheral vascular disease, unspecified: Secondary | ICD-10-CM | POA: Diagnosis not present

## 2017-01-27 DIAGNOSIS — L03116 Cellulitis of left lower limb: Secondary | ICD-10-CM | POA: Diagnosis not present

## 2017-01-30 ENCOUNTER — Encounter: Payer: Medicare Other | Attending: Surgery | Admitting: Surgery

## 2017-01-30 DIAGNOSIS — I70235 Atherosclerosis of native arteries of right leg with ulceration of other part of foot: Secondary | ICD-10-CM | POA: Insufficient documentation

## 2017-01-30 DIAGNOSIS — Z9221 Personal history of antineoplastic chemotherapy: Secondary | ICD-10-CM | POA: Diagnosis not present

## 2017-01-30 DIAGNOSIS — I70245 Atherosclerosis of native arteries of left leg with ulceration of other part of foot: Secondary | ICD-10-CM | POA: Diagnosis not present

## 2017-01-30 DIAGNOSIS — L97522 Non-pressure chronic ulcer of other part of left foot with fat layer exposed: Secondary | ICD-10-CM | POA: Diagnosis not present

## 2017-01-30 DIAGNOSIS — I872 Venous insufficiency (chronic) (peripheral): Secondary | ICD-10-CM | POA: Insufficient documentation

## 2017-01-30 DIAGNOSIS — Z7982 Long term (current) use of aspirin: Secondary | ICD-10-CM | POA: Insufficient documentation

## 2017-01-30 DIAGNOSIS — I739 Peripheral vascular disease, unspecified: Secondary | ICD-10-CM | POA: Diagnosis not present

## 2017-01-30 DIAGNOSIS — I878 Other specified disorders of veins: Secondary | ICD-10-CM | POA: Diagnosis not present

## 2017-01-30 DIAGNOSIS — Z8572 Personal history of non-Hodgkin lymphomas: Secondary | ICD-10-CM | POA: Diagnosis not present

## 2017-01-30 DIAGNOSIS — I10 Essential (primary) hypertension: Secondary | ICD-10-CM | POA: Insufficient documentation

## 2017-01-30 DIAGNOSIS — Z89412 Acquired absence of left great toe: Secondary | ICD-10-CM | POA: Diagnosis not present

## 2017-01-30 DIAGNOSIS — Z79899 Other long term (current) drug therapy: Secondary | ICD-10-CM | POA: Diagnosis not present

## 2017-01-30 DIAGNOSIS — Z923 Personal history of irradiation: Secondary | ICD-10-CM | POA: Insufficient documentation

## 2017-01-30 DIAGNOSIS — Z87891 Personal history of nicotine dependence: Secondary | ICD-10-CM | POA: Insufficient documentation

## 2017-01-30 DIAGNOSIS — L97512 Non-pressure chronic ulcer of other part of right foot with fat layer exposed: Secondary | ICD-10-CM | POA: Diagnosis not present

## 2017-01-30 DIAGNOSIS — B369 Superficial mycosis, unspecified: Secondary | ICD-10-CM | POA: Insufficient documentation

## 2017-01-30 NOTE — Progress Notes (Addendum)
[name], [name] ([id number]) Visit Report for 01/30/2017 Chief Complaint Document Details Patient Name: [name], [name] Date of Service: 01/30/2017 8:00 AM Medical Record Number: [id number] Patient Account Number: [id number] Date of Birth/Sex: 1919-01-21 (83 y.o. Male) Treating RN: Ahmed Prima Primary Care Provider: Other Clinician: Referring Provider: Yehuda Savannah Treating Provider/Extender: Frann Rider in Treatment: 0 Information Obtained from: Patient Chief Complaint Patient seen for complaints of Non-Healing Wound to both feet left worse than right for about 2 weeks Electronic Signature(s) Signed: 01/30/2017 9:00:17 AM By: Christin Fudge MD, FACS Entered By: Christin Fudge on 01/30/2017 09:00:16 [name] ([id number]) -------------------------------------------------------------------------------- Debridement Details Patient Name: [name] Date of Service: 01/30/2017 8:00 AM Medical Record Number: [id number] Patient Account Number: [id number] Date of Birth/Sex: [birth year] (83 y.o. Male) Treating RN: Cornell Barman Primary Care Provider: Other Clinician: Referring Provider: Yehuda Savannah Treating Provider/Extender: Frann Rider in Treatment: 0 Debridement Performed for Wound #1 Left,Distal Foot Assessment: Performed By: Physician Christin Fudge, MD Debridement: Debridement Pre-procedure Yes - 08:44 Verification/Time Out Taken: Start Time: 08:45 Pain Control: Lidocaine 4% Topical Solution Level: Skin/Subcutaneous Tissue Total Area Debrided (L x 2.5 (cm) x 5 (cm) = 12.5 (cm) W): Tissue and other Viable, Non-Viable, Exudate, Fibrin/Slough, Subcutaneous material debrided: Instrument: Forceps, Scissors Bleeding: Minimum Hemostasis Achieved: Pressure End Time: 08:50 Procedural Pain: 0 Post Procedural Pain: 0 Response to Treatment: Procedure was tolerated well Post Debridement Measurements of Total Wound Length: (cm) 2.5 Width: (cm) 5 Depth:  (cm) 0.1 Volume: (cm) 0.982 Character of Wound/Ulcer Post Requires Further Debridement Debridement: Severity of Tissue Post Debridement: Limited to breakdown of skin Post Procedure Diagnosis Same as Pre-procedure Electronic Signature(s) Signed: 03/08/2017 10:39:50 AM By: Gretta Cool, RN, BSN, Kim RN, BSN Signed: 03/09/2017 4:33:23 PM By: Christin Fudge MD, FACS Previous Signature: 01/30/2017 8:59:29 AM Version By: Christin Fudge MD, FACS Previous Signature: 01/30/2017 2:39:38 PM Version By: Katheran Awe, Dolly Rias ([id number]) Entered By: Gretta Cool, RN, BSN, Kim on 03/08/2017 10:39:50 [name] ([id number]) -------------------------------------------------------------------------------- Debridement Details Patient Name: [name] Date of Service: 01/30/2017 8:00 AM Medical Record Number: [id number] Patient Account Number: [id number] Date of Birth/Sex: July 08, 1919 (83 y.o. Male) Treating RN: Cornell Barman Primary Care Provider: Other Clinician: Referring Provider: Yehuda Savannah Treating Provider/Extender: Frann Rider in Treatment: 0 Debridement Performed for Wound #2 Right Toe - Web between 1st and 2nd Assessment: Performed By: Physician Christin Fudge, MD Debridement: Debridement Pre-procedure Yes - 08:44 Verification/Time Out Taken: Start Time: 08:50 Pain Control: Lidocaine 4% Topical Solution Level: Skin/Subcutaneous Tissue Total Area Debrided (L x 0.5 (cm) x 2.1 (cm) = 1.05 (cm) W): Tissue and other Viable, Non-Viable, Exudate, Fibrin/Slough, Subcutaneous material debrided: Instrument: Forceps, Scissors Bleeding: Minimum Hemostasis Achieved: Pressure End Time: 08:52 Procedural Pain: 0 Post Procedural Pain: 0 Response to Treatment: Procedure was tolerated well Post Debridement Measurements of Total Wound Length: (cm) 0.5 Width: (cm) 2.1 Depth: (cm) 0.1 Volume: (cm) 0.082 Character of Wound/Ulcer Post Requires Further  Debridement Debridement: Severity of Tissue Post Debridement: Limited to breakdown of skin Post Procedure Diagnosis Same as Pre-procedure Electronic Signature(s) Signed: 03/08/2017 10:40:13 AM By: Gretta Cool, RN, BSN, Kim RN, BSN Signed: 03/09/2017 4:33:23 PM By: Christin Fudge MD, FACS Previous Signature: 01/30/2017 8:59:48 AM Version By: Christin Fudge MD, FACS Previous Signature: 01/30/2017 2:39:38 PM Version By: Theodoro Parma ([id number]) Entered By: Gretta Cool RN, BSN, Kim on 03/08/2017 10:40:12 [name] ([id number]) -------------------------------------------------------------------------------- HPI Details Patient Name: [name] Date of Service: 01/30/2017 8:00 AM  Medical Record Number: [id number] Patient Account Number: [id number] Date of Birth/Sex: 1919-08-04 (83 y.o. Male) Treating RN: Ahmed Prima Primary Care Provider: Other Clinician: Referring Provider: Yehuda Savannah Treating Provider/Extender: Frann Rider in Treatment: 0 History of Present Illness Location: left and right dorsum foot and in between his toes Quality: Patient reports No Pain. Severity: Patient states wound are getting worse. Duration: Patient has had the wound for < 2 weeks prior to presenting for treatment Timing: Pain in wound is Intermittent (comes and goes Context: The wound appeared gradually over time Modifying Factors: Other treatment(s) tried include:admitted to the hospital and had a arterial workup and has been on oral antibiotics Associated Signs and Symptoms: Patient reports having increase discharge. HPI Description: 83 year old gentleman comes from [facility] with a history of having recently been admitted to the hospital between February 20 and 01/20/2017, and he was admitted with cellulitis and peripheral vascular disease with possible status dermatitis and an open toe wound. he is got a medical history significant of peripheral vascular disease,  hypertension, non-Hodgkin's lymphoma in remission and amputation of the left great toe. status post amputation of the left big toe in July 2016 and biopsy of the left neck mass in October 2014. he does not smoke at present and given In the 80s. I understand in the past he's had a venous duplex study done and was treated for symptomatic varicose veins with compression stockings. Patient was treated in hospital with Rocephin and Flagyl and was discharged home on Keflex and Diflucan for 7 days. He had a ABI which showed reduced light flow in the left lower extremity and vascular surgery recommended an outpatient angiogram. right ABI was 0.94 with a monophasic flow in the left ABI was 0.67 with a monophasic flow. was seen by the vascular surgeon Dr. Gwenlyn Saran who noted wounds of both left and right toes with depressed ABIs bilaterally and objective evidence of chronic venous disease. x-ray of the left foot -- IMPRESSION:1. Diffuse soft tissue swelling and osteopenia. No obvious bony destructive process or periosteal reaction. 2. Patient is status post amputation of the tuft of the first distal phalanx. Electronic Signature(s) Signed: 01/30/2017 9:01:24 AM By: Christin Fudge MD, FACS Previous Signature: 01/30/2017 8:21:56 AM Version By: Christin Fudge MD, FACS Previous Signature: 01/30/2017 8:20:51 AM Version By: Christin Fudge MD, FACS Previous Signature: 01/30/2017 8:16:05 AM Version By: Christin Fudge MD, FACS Entered By: Christin Fudge on 01/30/2017 09:01:24 [name] ([id number]) -------------------------------------------------------------------------------- Physical Exam Details Patient Name: [name] Date of Service: 01/30/2017 8:00 AM Medical Record Number: [id number] Patient Account Number: [id number] Date of Birth/Sex: 27-Feb-1919 (83 y.o. Male) Treating RN: Ahmed Prima Primary Care Provider: Other Clinician: Referring Provider: Yehuda Savannah Treating Provider/Extender: Frann Rider in Treatment: 0 Constitutional . Pulse regular. Respirations normal and unlabored. Afebrile. . Eyes Nonicteric. Reactive to light. Ears, Nose, Mouth, and Throat Lips, teeth, and gums WNL.Marland Kitchen Moist mucosa without lesions. Neck supple and nontender. No palpable supraclavicular or cervical adenopathy. Normal sized without goiter. Respiratory WNL. No retractions.. Breath sounds WNL, No rubs, rales, rhonchi, or wheeze.. Cardiovascular . Pedal Pulses WNL. recent inpatient ABI studies noted. No clubbing, cyanosis or edema.. Chest Breasts symmetical and no nipple discharge.. Breast tissue WNL, no masses, lumps, or tenderness.. Gastrointestinal (GI) Abdomen without masses or tenderness.. No liver or spleen enlargement or tenderness.. Lymphatic No adneopathy. No adenopathy. No adenopathy. Musculoskeletal Adexa without tenderness or enlargement.. Digits and nails w/o clubbing, cyanosis, infection, petechiae, ischemia, or inflammatory  conditions.. Integumentary (Hair, Skin) No suspicious lesions. No crepitus or fluctuance. No peri-wound warmth or erythema. No masses.Marland Kitchen Psychiatric Judgement and insight Intact.. No evidence of depression, anxiety, or agitation.. Notes on the dorsum of his left foot he had a lot of eschar which made it look like there were a lot of ulcerations on the dorsum and between his toes but after carefully removing it there is evidence of fungal infection and moisture but no gross ulcerations except for a few linear superficial ulcers/fissures between the toes Similar picture on the right between the first and second toe between the web space. Electronic Signature(s) Signed: 01/30/2017 9:03:30 AM By: Christin Fudge MD, FACS Entered By: Christin Fudge on 01/30/2017 09:03:30 [name] ([id number]) [name] ([id number]) -------------------------------------------------------------------------------- Physician Orders Details Patient Name: [name] Date of Service: 01/30/2017 8:00 AM Medical Record Number: [id number] Patient Account Number: [id number] Date of Birth/Sex: 20-Apr-1919 (83 y.o. Male) Treating RN: Ahmed Prima Primary Care Provider: Other Clinician: Referring Provider: Yehuda Savannah Treating Provider/Extender: Frann Rider in Treatment: 0 Verbal / Phone Orders: Yes Clinician: Pinkerton, Debi Read Back and Verified: Yes Diagnosis Coding Wound Cleansing Wound #1 Left,Distal Foot o Clean wound with Normal Saline. o Cleanse wound with mild soap and water - make sure foot is completely dry Wound #2 Right Toe - Web between 1st and 2nd o Clean wound with Normal Saline. o Cleanse wound with mild soap and water - make sure foot is completely dry Anesthetic Wound #1 Left,Distal Foot o Topical Lidocaine 4% cream applied to wound bed prior to debridement - clinic use only Wound #2 Right Toe - Web between 1st and 2nd o Topical Lidocaine 4% cream applied to wound bed prior to debridement - clinic use only Skin Barriers/Peri-Wound Care Wound #1 Left,Distal Foot o Antifungal cream Wound #2 Right Toe - Web between 1st and 2nd o Antifungal cream - ALL OVER AFFECTED AREAS o Moisturizing lotion - to legs and feet (NOT BETWEEN TOES) Primary Wound Dressing Wound #1 Left,Distal Foot o Aquacel Ag - or interdry between toes o Other: - anti fungal cream Wound #2 Right Toe - Web between 1st and 2nd o Aquacel Ag - or interdry between toes o Other: - anti fungal cream Secondary Dressing Wound #1 Left,Distal Foot o Gauze and Kerlix/Conform Gearin, Esaias T. ([id number]) o Other - tape, stretch netting Wound #2 Right Toe - Web between 1st and 2nd o Gauze and Kerlix/Conform o Other - tape Dressing Change Frequency Wound #1 Left,Distal Foot o Change dressing every day. Wound #2 Right Toe - Web between 1st and 2nd o Change dressing every day. Follow-up Appointments Wound #1  Left,Distal Foot o Return Appointment in 1 week. Wound #2 Right Toe - Web between 1st and 2nd o Return Appointment in 1 week. Edema Control Wound #1 Left,Distal Foot o Elevate legs to the level of the heart and pump ankles as often as possible Wound #2 Right Toe - Web between 1st and 2nd o Elevate legs to the level of the heart and pump ankles as often as possible Additional Orders / Instructions Wound #1 Left,Distal Foot o Increase protein intake. Wound #2 Right Toe - Web between 1st and 2nd o Increase protein intake. Home Health Wound #1 [place] Nurse may visit PRN to address patientos wound care needs. o FACE TO FACE ENCOUNTER: MEDICARE and MEDICAID PATIENTS: I certify that this patient is under my care and that I  had a face-to-face encounter that meets the physician face-to-face encounter requirements with this patient on this date. The encounter with the patient was in whole or in part for the following MEDICAL CONDITION: (primary reason for [place]) MEDICAL NECESSITY: I certify, that based on my findings, NURSING services are a medically necessary home health service. HOME BOUND STATUS: I certify that my clinical findings support that this patient is homebound (i.e., Due to illness or injury, pt requires aid of supportive devices such as crutches, cane, wheelchairs, walkers, the use of special transportation or the assistance of another person to leave their place of residence. There is a Boch, Jahmil T. ([id number]) normal inability to leave the home and doing so requires considerable and taxing effort. Other absences are for medical reasons / religious services and are infrequent or of short duration when for other reasons). o If current dressing causes regression in wound condition, may D/C ordered dressing product/s and apply Normal Saline Moist Dressing daily until next [place] /  Other MD appointment. [place] of regression in wound condition at [phone]. o Please direct any NON-WOUND related issues/requests for orders to patient's Primary Care Physician Wound #2 Right Toe - Web between 1st and [place] Nurse may visit PRN to address patientos wound care needs. o FACE TO FACE ENCOUNTER: MEDICARE and MEDICAID PATIENTS: I certify that this patient is under my care and that I had a face-to-face encounter that meets the physician face-to-face encounter requirements with this patient on this date. The encounter with the patient was in whole or in part for the following MEDICAL CONDITION: (primary reason for [place]) MEDICAL NECESSITY: I certify, that based on my findings, NURSING services are a medically necessary home health service. HOME BOUND STATUS: I certify that my clinical findings support that this patient is homebound (i.e., Due to illness or injury, pt requires aid of supportive devices such as crutches, cane, wheelchairs, walkers, the use of special transportation or the assistance of another person to leave their place of residence. There is a normal inability to leave the home and doing so requires considerable and taxing effort. Other absences are for medical reasons / religious services and are infrequent or of short duration when for other reasons). o If current dressing causes regression in wound condition, may D/C ordered dressing product/s and apply Normal Saline Moist Dressing daily until next [place] / Other MD appointment. [place] of regression in wound condition at [phone]. o Please direct any NON-WOUND related issues/requests for orders to patient's Primary Care Physician Medications-please add to medication list. Wound #1 Left,Distal Foot o Other: - anti-fungal cream Wound #2 Right Toe - Web between 1st and 2nd o Other: -  anti-fungal cream Patient Medications Allergies: NKDA Notifications Medication Indication Start End Lotrisone 01/30/2017 DOSE topical 1 %-0.05 % cream - cream topical as directed Electronic Signature(s) Signed: 01/30/2017 9:04:31 AM By: Christin Fudge MD, FACS [name], [name] Kitchen ([id number]) Entered By: Christin Fudge on 01/30/2017 09:04:31 [name] ([id number]) -------------------------------------------------------------------------------- Problem List Details Patient Name: [name] Date of Service: 01/30/2017 8:00 AM Medical Record Number: [id number] Patient Account Number: [id number] Date of Birth/Sex: 1919/02/25 (83 y.o. Male) Treating RN: Ahmed Prima Primary Care Provider: Other Clinician: Referring Provider: Yehuda Savannah Treating Provider/Extender: Frann Rider in Treatment: 0 Active Problems ICD-10 Encounter Code Description Active Date Diagnosis I70.235 Atherosclerosis of native arteries of right leg with 01/30/2017 Yes  ulceration of other part of foot I70.245 Atherosclerosis of native arteries of left leg with ulceration 01/30/2017 Yes of other part of foot I87.2 Venous insufficiency (chronic) (peripheral) 01/30/2017 Yes B36.9 Superficial mycosis, unspecified 01/30/2017 Yes Inactive Problems Resolved Problems Electronic Signature(s) Signed: 01/30/2017 8:58:54 AM By: Christin Fudge MD, FACS Entered By: Christin Fudge on 01/30/2017 08:58:54 [name] ([id number]) -------------------------------------------------------------------------------- Progress Note Details Patient Name: [name] Date of Service: 01/30/2017 8:00 AM Medical Record Number: [id number] Patient Account Number: [id number] Date of Birth/Sex: 1919-07-10 (83 y.o. Male) Treating RN: Ahmed Prima Primary Care Provider: Other Clinician: Referring Provider: Yehuda Savannah Treating Provider/Extender: Frann Rider in Treatment: 0 Subjective Chief Complaint Information  obtained from Patient Patient seen for complaints of Non-Healing Wound to both feet left worse than right for about 2 weeks History of Present Illness (HPI) The following HPI elements were documented for the patient's wound: Location: left and right dorsum foot and in between his toes Quality: Patient reports No Pain. Severity: Patient states wound are getting worse. Duration: Patient has had the wound for < 2 weeks prior to presenting for treatment Timing: Pain in wound is Intermittent (comes and goes Context: The wound appeared gradually over time Modifying Factors: Other treatment(s) tried include:admitted to the hospital and had a arterial workup and has been on oral antibiotics Associated Signs and Symptoms: Patient reports having increase discharge. 83 year old gentleman comes from [facility] with a history of having recently been admitted to the hospital between February 20 and 01/20/2017, and he was admitted with cellulitis and peripheral vascular disease with possible status dermatitis and an open toe wound. he is got a medical history significant of peripheral vascular disease, hypertension, non-Hodgkin's lymphoma in remission and amputation of the left great toe. status post amputation of the left big toe in July 2016 and biopsy of the left neck mass in October 2014. he does not smoke at present and given In the 80s. I understand in the past he's had a venous duplex study done and was treated for symptomatic varicose veins with compression stockings. Patient was treated in hospital with Rocephin and Flagyl and was discharged home on Keflex and Diflucan for 7 days. He had a ABI which showed reduced light flow in the left lower extremity and vascular surgery recommended an outpatient angiogram. right ABI was 0.94 with a monophasic flow in the left ABI was 0.67 with a monophasic flow. was seen by the vascular surgeon Dr. Gwenlyn Saran who noted wounds of both left and right toes  with depressed ABIs bilaterally and objective evidence of chronic venous disease. x-ray of the left foot -- IMPRESSION:1. Diffuse soft tissue swelling and osteopenia. No obvious bony destructive process or periosteal reaction. 2. Patient is status post amputation of the tuft of the first distal phalanx. Wound History Patient presents with 4 open wounds that have been present for approximately 2 weeks. Patient has been treating wounds in the following manner: interdry. Laboratory tests have not been performed in the last [name], Jimmy T. ([id number]) month. Patient reportedly has not tested positive for an antibiotic resistant organism. Patient reportedly has tested positive for osteomyelitis. Patient reportedly has had testing performed to evaluate circulation in the legs. Patient experiences the following problems associated with their wounds: swelling. Patient History Information obtained from Patient. Allergies NKDA Social History Former smoker - 1980, Marital Status - Married, Alcohol Use - Never, Drug Use - No History, Caffeine Use - Daily. Medical History Eyes Patient has history of Cataracts -  surgery Cardiovascular Patient has history of Hypertension, Peripheral Venous Disease Oncologic Patient has history of Received Chemotherapy - 2014, Received Radiation - 2014 Review of Systems (ROS) Constitutional Symptoms (General Health) The patient has no complaints or symptoms. Ear/Nose/Mouth/Throat The patient has no complaints or symptoms. Hematologic/Lymphatic The patient has no complaints or symptoms. Respiratory The patient has no complaints or symptoms. Gastrointestinal acid refux Endocrine Complains or has symptoms of Thyroid disease. Genitourinary The patient has no complaints or symptoms. Immunological The patient has no complaints or symptoms. Integumentary (Skin) Complains or has symptoms of Wounds. Musculoskeletal The patient has no complaints or  symptoms. Neurologic The patient has no complaints or symptoms. Oncologic non-hodkins lymphoma [name], [name] ([id number]) Medications Tylenol 325 mg tablet oral tablet oral gabapentin 100 mg capsule oral 2 capsule oral in the morning gabapentin 300 mg capsule oral capsule oral nightly enalapril maleate 20 mg tablet oral tablet oral cephalexin 500 mg tablet oral tablet oral furosemide 20 mg tablet oral tablet oral aspirin 81 mg tablet,delayed release oral tablet,delayed release (DR/EC) oral levothyroxine 88 mcg tablet oral tablet oral Lotrisone 1 %-0.05 % topical cream topical cream topical as directed Vitamin B-12 1,000 mcg tablet oral tablet oral Objective Constitutional Pulse regular. Respirations normal and unlabored. Afebrile. Vitals Time Taken: 8:13 AM, Height: 73 in, Source: Stated, Weight: 205 lbs, Source: Measured, BMI: 27, Pulse: 78 bpm, Respiratory Rate: 16 breaths/min, Blood Pressure: 111/77 mmHg. Eyes Nonicteric. Reactive to light. Ears, Nose, Mouth, and Throat Lips, teeth, and gums WNL.Marland Kitchen Moist mucosa without lesions. Neck supple and nontender. No palpable supraclavicular or cervical adenopathy. Normal sized without goiter. Respiratory WNL. No retractions.. Breath sounds WNL, No rubs, rales, rhonchi, or wheeze.. Cardiovascular Pedal Pulses WNL. recent inpatient ABI studies noted. No clubbing, cyanosis or edema.. Chest Breasts symmetical and no nipple discharge.. Breast tissue WNL, no masses, lumps, or tenderness.. Gastrointestinal (GI) Abdomen without masses or tenderness.. No liver or spleen enlargement or tenderness.. Lymphatic No adneopathy. No adenopathy. No adenopathy. [name], Jimmy T. ([id number]) Musculoskeletal Adexa without tenderness or enlargement.. Digits and nails w/o clubbing, cyanosis, infection, petechiae, ischemia, or inflammatory conditions.Marland Kitchen Psychiatric Judgement and insight Intact.. No evidence of depression, anxiety, or agitation.. General  Notes: on the dorsum of his left foot he had a lot of eschar which made it look like there were a lot of ulcerations on the dorsum and between his toes but after carefully removing it there is evidence of fungal infection and moisture but no gross ulcerations except for a few linear superficial ulcers/fissures between the toes Similar picture on the right between the first and second toe between the web space. Integumentary (Hair, Skin) No suspicious lesions. No crepitus or fluctuance. No peri-wound warmth or erythema. No masses.. Wound #1 status is Open. Original cause of wound was Gradually Appeared. The wound is located on the Left,Distal Foot. The wound measures 2.5cm length x 5cm width x 0.1cm depth; 9.817cm^2 area and 0.982cm^3 volume. The wound is limited to skin breakdown. There is no tunneling or undermining noted. There is a large amount of purulent drainage noted. The wound margin is distinct with the outline attached to the wound base. There is no granulation within the wound bed. There is a large (67-100%) amount of necrotic tissue within the wound bed including Eschar and Adherent Slough. The periwound skin appearance exhibited: Dry/Scaly. Periwound temperature was noted as No Abnormality. The periwound has tenderness on palpation. Wound #2 status is Open. Original cause of wound was Gradually Appeared. The wound is located on  the Right Toe - Web between 1st and 2nd. The wound measures 0.5cm length x 2.1cm width x 0.1cm depth; 0.825cm^2 area and 0.082cm^3 volume. The wound is limited to skin breakdown. There is no tunneling or undermining noted. There is a large amount of purulent drainage noted. The wound margin is distinct with the outline attached to the wound base. There is no granulation within the wound bed. There is a large (67- 100%) amount of necrotic tissue within the wound bed including Eschar and Adherent Slough. The periwound skin appearance exhibited: Dry/Scaly.  Periwound temperature was noted as No Abnormality. The periwound has tenderness on palpation. 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 After cleaning out the wounds nicely there are several linear fissures and ulcerations but no evidence of deep ulcers. I have recommended: 1. Lotrisone cream to be applied over the areas in question and a silver alginate based dressing material to remove moisture 2. Follow-up with dermatology, vascular surgery and his PCP 3. Regular visits to the wound center Patient's son was at the bedside has had all questions answered and they'll be available to bring the father to [place] every week Procedures Wound #1 Wound #1 is an Atypical located on the Left,Distal Foot . There was a Skin/Subcutaneous Tissue Debridement ([id number]) debridement with total area of 12.5 sq cm performed by Christin Fudge, MD. with the following instrument(s): Forceps and Scissors to remove Viable and Non-Viable tissue/material including Exudate, Fibrin/Slough, and Subcutaneous after achieving pain control using Lidocaine 4% Topical Solution. A time out was conducted at 08:44, prior to the start of the procedure. A Minimum amount of bleeding was controlled with Pressure. The procedure was tolerated well with a pain level of 0 throughout and a pain level of 0 following the procedure. Post Debridement Measurements: 2.5cm length x 5cm width x 0.1cm depth; 0.982cm^3 volume. Character of  Wound/Ulcer Post Debridement requires further debridement. Severity of Tissue Post Debridement is: Limited to breakdown of skin. Post procedure Diagnosis Wound #1: Same as Pre-Procedure Wound #2 Wound #2 is an Atypical located on the Right Toe - Web between 1st and 2nd . There was a Skin/Subcutaneous Tissue Debridement ([id number]) debridement with total area of 1.05 sq cm performed by Christin Fudge, MD. with the following instrument(s): Forceps and Scissors to remove Viable and Non-Viable tissue/material including Exudate, Fibrin/Slough, and Subcutaneous after achieving pain control using Lidocaine 4% Topical Solution. A time out was conducted at 08:44, prior to the start of the procedure. A Minimum amount of bleeding was controlled with Pressure. The procedure was tolerated well with a pain level of 0 throughout and a pain level of 0 following the procedure. Post Debridement Measurements: 0.5cm length x 2.1cm width x 0.1cm depth; 0.082cm^3 volume. Character of Wound/Ulcer Post Debridement requires further debridement. Severity of Tissue Post Debridement is: Limited to breakdown of skin. Post procedure Diagnosis Wound #2: Same as Pre-Procedure [name], Jimmy T. ([id number]) Plan Wound Cleansing: Wound #1 Left,Distal Foot: Clean wound with Normal Saline. Cleanse wound with mild soap and water - make sure foot is completely dry Wound #  2 Right Toe - Web between 1st and 2nd: Clean wound with Normal Saline. Cleanse wound with mild soap and water - make sure foot is completely dry Anesthetic: Wound #1 Left,Distal Foot: Topical Lidocaine 4% cream applied to wound bed prior to debridement - clinic use only Wound #2 Right Toe - Web between 1st and 2nd: Topical Lidocaine 4% cream applied to wound bed prior to debridement - clinic use only Skin Barriers/Peri-Wound Care: Wound #1 Left,Distal Foot: Antifungal cream Wound #2 Right Toe - Web between 1st and 2nd: Antifungal cream - ALL OVER AFFECTED  AREAS Moisturizing lotion - to legs and feet (NOT BETWEEN TOES) Primary Wound Dressing: Wound #1 Left,Distal Foot: Aquacel Ag - or interdry between toes Other: - anti fungal cream Wound #2 Right Toe - Web between 1st and 2nd: Aquacel Ag - or interdry between toes Other: - anti fungal cream Secondary Dressing: Wound #1 Left,Distal Foot: Gauze and Kerlix/Conform Other - tape, stretch netting Wound #2 Right Toe - Web between 1st and 2nd: Gauze and Kerlix/Conform Other - tape Dressing Change Frequency: Wound #1 Left,Distal Foot: Change dressing every day. Wound #2 Right Toe - Web between 1st and 2nd: Change dressing every day. Follow-up Appointments: Wound #1 Left,Distal Foot: Return Appointment in 1 week. Wound #2 Right Toe - Web between 1st and 2nd: Return Appointment in 1 week. Edema Control: Wound #1 Left,Distal Foot: Elevate legs to the level of the heart and pump ankles as often as possible Wound #2 Right Toe - Web between 1st and 2nd: Elevate legs to the level of the heart and pump ankles as often as possible Additional Orders / InstructionsKADEEM, [name] ([id number]) Wound #1 Left,Distal Foot: Increase protein intake. Wound #2 Right Toe - Web between 1st and 2nd: Increase protein intake. Home Health: Wound #1 Left,Distal Foot: [place] Nurse may visit PRN to address patient s wound care needs. FACE TO FACE ENCOUNTER: MEDICARE and MEDICAID PATIENTS: I certify that this patient is under my care and that I had a face-to-face encounter that meets the physician face-to-face encounter requirements with this patient on this date. The encounter with the patient was in whole or in part for the following MEDICAL CONDITION: (primary reason for [place]) MEDICAL NECESSITY: I certify, that based on my findings, NURSING services are a medically necessary home health service. HOME BOUND STATUS: I certify that my clinical findings support that  this patient is homebound (i.e., Due to illness or injury, pt requires aid of supportive devices such as crutches, cane, wheelchairs, walkers, the use of special transportation or the assistance of another person to leave their place of residence. There is a normal inability to leave the home and doing so requires considerable and taxing effort. Other absences are for medical reasons / religious services and are infrequent or of short duration when for other reasons). If current dressing causes regression in wound condition, may D/C ordered dressing product/s and apply Normal Saline Moist Dressing daily until next [place] / Other MD appointment. [place] of regression in wound condition at [phone]. Please direct any NON-WOUND related issues/requests for orders to patient's Primary Care Physician Wound #2 Right Toe - Web between 1st and 2nd: [place] Nurse may visit PRN to address patient s wound care needs. FACE TO FACE ENCOUNTER: MEDICARE and MEDICAID PATIENTS: I certify that this patient is under my care and that I had a face-to-face encounter that meets the  physician face-to-face encounter requirements with this patient on this date. The encounter with the patient was in whole or in part for the following MEDICAL CONDITION: (primary reason for [place]) MEDICAL NECESSITY: I certify, that based on my findings, NURSING services are a medically necessary home health service. HOME BOUND STATUS: I certify that my clinical findings support that this patient is homebound (i.e., Due to illness or injury, pt requires aid of supportive devices such as crutches, cane, wheelchairs, walkers, the use of special transportation or the assistance of another person to leave their place of residence. There is a normal inability to leave the home and doing so requires considerable and taxing effort. Other absences are for medical reasons  / religious services and are infrequent or of short duration when for other reasons). If current dressing causes regression in wound condition, may D/C ordered dressing product/s and apply Normal Saline Moist Dressing daily until next [place] / Other MD appointment. [place] of regression in wound condition at [phone]. 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 After cleaning out the wounds nicely there are several linear fissures and ulcerations but no evidence of deep ulcers. I have recommended: 1. Lotrisone cream to be applied over the areas in question and a silver alginate based dressing material to remove moisture 2. Follow-up with dermatology, vascular surgery and his PCP 3. Regular visits to the wound center Patient's son was at the bedside has had all questions answered and they'll be available to bring the father to [place] every week Electronic Signature(s) Signed: 03/15/2017 11:44:59 AM By: Gretta Cool, RN, BSN, Kim RN, BSN Signed: 03/28/2017 12:41:57 PM By: Christin Fudge MD, FACS Previous Signature: 01/30/2017 9:06:53 AM Version By: Christin Fudge MD, FACS Entered By: Gretta Cool, RN, BSN, Kim on 03/15/2017 11:44:58 [name] ([id number]) -------------------------------------------------------------------------------- ROS/PFSH Details Patient Name: [name] Date of  Service: 01/30/2017 8:00 AM Medical Record Number: [id number] Patient Account Number: [id number] Date of Birth/Sex: September 09, 1919 (83 y.o. Male) Treating RN: Ahmed Prima Primary Care Provider: Other Clinician: Referring Provider: Yehuda Savannah Treating Provider/Extender: Frann Rider in Treatment: 0 Information Obtained From Patient Wound History Do you currently have one or more open woundso Yes How many open wounds do you currently haveo 4 Approximately how long have you had your woundso 2 weeks How have you been treating your wound(s) until nowo interdry Has your wound(s) ever healed and then re-openedo No Have you had any lab work done in the past montho No Have you tested positive for an antibiotic resistant organism (MRSA, VRE)o No Have you tested positive for osteomyelitis (bone infection)o Yes Date: 05/02/2014 Have you had any tests for circulation on your legso Yes Who ordered the testo dr. Donzetta Matters Where was the test doneo [place] Have you had other problems associated with your woundso Swelling Endocrine Complaints and Symptoms: Positive for: Thyroid disease Integumentary (Skin) Complaints and Symptoms: Positive for: Wounds Constitutional Symptoms (General Health) Complaints and Symptoms: No Complaints or Symptoms  Eyes Medical History: Positive for: Cataracts - surgery Ear/Nose/Mouth/Throat Complaints and Symptoms: No Complaints or Symptoms [name], Jimmy T. ([id number]) Hematologic/Lymphatic Complaints and Symptoms: No Complaints or Symptoms Respiratory Complaints and Symptoms: No Complaints or Symptoms Cardiovascular Medical History: Positive for: Hypertension; Peripheral Venous Disease Gastrointestinal Complaints and Symptoms: Review of System Notes: acid refux Genitourinary Complaints and Symptoms: No Complaints or Symptoms Immunological Complaints and Symptoms: No Complaints or Symptoms Musculoskeletal Complaints and Symptoms: No  Complaints or Symptoms Neurologic Complaints and Symptoms: No Complaints or Symptoms Oncologic Complaints and Symptoms: Review of System Notes: non-hodkins lymphoma Medical History: Positive for: Received Chemotherapy - 2014; Received Radiation - 2014 [facility] Extended History Items [name], Jimmy T. ([id number]) Eyes: Cataracts Immunizations Pneumococcal Vaccine: Received Pneumococcal Vaccination: No Family and Social History Former smoker - 1980; Marital Status - Married; Alcohol Use: Never; Drug Use: No History; Caffeine Use: Daily; Financial Concerns: No; Food, Clothing or Shelter Needs: No; Support System Lacking: No; Transportation Concerns: No; Advanced Directives: No; Patient does not want information on Advanced Directives; Do not resuscitate: No; Living Will: No; Medical Power of Attorney: No Physician Affirmation I have reviewed and agree with the above information. Electronic Signature(s) Signed: 01/30/2017 2:21:38 PM By: Christin Fudge MD, FACS Signed: 01/30/2017 2:39:38 PM By: Alric Quan Entered By: Christin Fudge on 01/30/2017 08:25:44 [name] ([id number]) -------------------------------------------------------------------------------- SuperBill Details Patient Name: [name] Date of Service: 01/30/2017 Medical Record Number: [id number] Patient Account Number: [id number] Date of Birth/Sex: [birth year] (83 y.o. Male) Treating RN: Ahmed Prima Primary Care Provider: Other Clinician: Referring Provider: Yehuda Savannah Treating Provider/Extender: Frann Rider in Treatment: 0 Diagnosis Coding ICD-10 Codes Code Description [phone] Atherosclerosis of native arteries of right leg with ulceration of other part of foot I70.245 Atherosclerosis of native arteries of left leg with ulceration of other part of foot I87.2 Venous insufficiency (chronic) (peripheral) B36.9 Superficial mycosis, unspecified Facility Procedures CPT4: Description Modifier  Quantity Code [id number] - WOUND CARE VISIT-LEV 3 EST PT 1 CPT4: 14431540 11042 - DEB SUBQ TISSUE 20 SQ CM/< 1 ICD-10 Description Diagnosis I70.235 Atherosclerosis of native arteries of right leg with ulceration of other part of foot I70.245 Atherosclerosis of native arteries of left leg with ulceration of other  part of foot I87.2 Venous insufficiency (chronic) (peripheral) B36.9 Superficial mycosis, unspecified Physician Procedures CPT4: Description Modifier Quantity Code [id number] 99204 - WC PHYS LEVEL 4 - NEW PT 25 1 ICD-10 Description Diagnosis I70.235 Atherosclerosis of native arteries of right leg with ulceration of other part of foot I70.245 Atherosclerosis of native arteries  of left leg with ulceration of other part of foot I87.2 Venous insufficiency (chronic) (peripheral) B36.9 Superficial mycosis, unspecified [place], Riccardo T. ([id number]) Electronic Signature(s) Signed: 03/15/2017 10:43:57 AM By: Gretta Cool, RN, BSN, Kim RN, BSN Signed: 03/28/2017 12:41:57 PM By: Christin Fudge MD, FACS Previous Signature: 01/30/2017 2:21:38 PM Version By: Christin Fudge MD, FACS Previous Signature: 01/30/2017 2:39:38 PM Version By: Alric Quan Previous Signature: 01/30/2017 9:07:14 AM Version By: Christin Fudge MD, FACS Entered By: Gretta Cool RN, BSN, Kim on 03/15/2017 10:43:56

## 2017-01-30 NOTE — Progress Notes (Signed)
IAM, LIPSON ([id number]) Visit Report for 01/30/2017 Allergy List Details Patient Name: [name], [name] Date of Service: 01/30/2017 8:00 AM Medical Record Number: [id number] Patient Account Number: [id number] Date of Birth/Sex: 02-Jun-1919 (83 y.o. Male) Treating RN: Ahmed Prima Primary Care Jeston Junkins: Jenna Luo Other Clinician: Referring Lucy Boardman: Jenna Luo Treating Bryce Kimble/Extender: Frann Rider in Treatment: 0 Allergies Active Allergies NKDA Allergy Notes Electronic Signature(s) Signed: 01/30/2017 2:39:38 PM By: Alric Quan Entered By: Alric Quan on 01/30/2017 08:15:23 [name] ([id number]) -------------------------------------------------------------------------------- Arrival Information Details Patient Name: [name] Date of Service: 01/30/2017 8:00 AM Medical Record Number: [id number] Patient Account Number: [id number] Date of Birth/Sex: [birth year] (83 y.o. Male) Treating RN: Ahmed Prima Primary Care Kolbie Lepkowski: Jenna Luo Other Clinician: Referring Judd Mccubbin: Jenna Luo Treating Kenzie Flakes/Extender: Frann Rider in Treatment: 0 Visit Information Patient Arrived: Wheel Chair Arrival Time: 08:07 Accompanied By: son Transfer Assistance: None Patient Identification Verified: Yes Secondary Verification Process Yes Completed: Patient Requires Transmission-Based No Precautions: Patient Has Alerts: No Electronic Signature(s) Signed: 01/30/2017 2:39:38 PM By: Alric Quan Entered By: Alric Quan on 01/30/2017 08:12:54 [name] ([id number]) -------------------------------------------------------------------------------- Clinic Level of Care Assessment Details Patient Name: [name] Date of Service: 01/30/2017 8:00 AM Medical Record Number: [id number] Patient Account Number: [id number] Date of Birth/Sex: 02/20/1919 (83 y.o. Male) Treating RN: Ahmed Prima Primary Care Kimie Pidcock: Jenna Luo Other Clinician: Referring Shain Pauwels: Jenna Luo Treating Kacia Halley/Extender: Frann Rider in Treatment: 0 Clinic Level of Care Assessment Items TOOL 1 Quantity Score X - Use when EandM and Procedure is performed on INITIAL visit 1 0 ASSESSMENTS - Nursing Assessment / Reassessment X - General Physical Exam (combine w/ comprehensive assessment (listed just 1 20 below) when performed on new pt. evals) X - Comprehensive Assessment (HX, ROS, Risk Assessments, Wounds Hx, etc.) 1 25 ASSESSMENTS - Wound and Skin Assessment / Reassessment []  - Dermatologic / Skin Assessment (not related to wound area) 0 ASSESSMENTS - Ostomy and/or Continence Assessment and Care []  - Incontinence Assessment and Management 0 []  - Ostomy Care Assessment and Management (repouching, etc.) 0 PROCESS - Coordination of Care []  - Simple Patient / Family Education for ongoing care 0 X - Complex (extensive) Patient / Family Education for ongoing care 1 20 X - Staff obtains Programmer, systems, Records, Test Results / Process Orders 1 10 X - Staff telephones HHA, Nursing Homes / Clarify orders / etc 1 10 []  - Routine Transfer to another Facility (non-emergent condition) 0 []  - Routine Hospital Admission (non-emergent condition) 0 X - New Admissions / Biomedical engineer / Ordering NPWT, Apligraf, etc. 1 15 []  - Emergency Hospital Admission (emergent condition) 0 PROCESS - Special Needs []  - Pediatric / Minor Patient Management 0 []  - Isolation Patient Management 0 Leinbach, Adden T. ([id number]) []  - Hearing / Language / Visual special needs 0 []  - Assessment of Community assistance (transportation, D/C planning, etc.) 0 []  - Additional assistance / Altered mentation 0 []  - Support Surface(s) Assessment (bed, cushion, seat, etc.) 0 INTERVENTIONS - Miscellaneous []  - External ear exam 0 []  - Patient Transfer (multiple staff / Civil Service fast streamer / Similar devices) 0 []  - Simple Staple / Suture removal (25 or less)  0 []  - Complex Staple / Suture removal (26 or more) 0 []  - Hypo/Hyperglycemic Management (do not check if billed separately) 0 X - Ankle / Brachial Index (ABI) - do not check if billed separately 1 15 Has the patient been seen at the hospital within the last three years: Yes  Total Score: 115 Level Of Care: New/Established - Level 3 Electronic Signature(s) Signed: 01/30/2017 2:39:38 PM By: Alric Quan Entered By: Alric Quan on 01/30/2017 09:14:17 [name] ([id number]) -------------------------------------------------------------------------------- Encounter Discharge Information Details Patient Name: [name] Date of Service: 01/30/2017 8:00 AM Medical Record Number: [id number] Patient Account Number: [id number] Date of Birth/Sex: 1919-01-05 (83 y.o. Male) Treating RN: Ahmed Prima Primary Care Demtrius Rounds: Jenna Luo Other Clinician: Referring Kesia Dalto: Jenna Luo Treating Donnia Poplaski/Extender: Frann Rider in Treatment: 0 Encounter Discharge Information Items Discharge Pain Level: 0 Discharge Condition: Stable Ambulatory Status: Wheelchair Discharge Destination: Home Transportation: Private Auto Accompanied By: son Schedule Follow-up Appointment: Yes Medication Reconciliation completed No and provided to Patient/Care Cordae Mccarey: Provided on Clinical Summary of Care: 01/30/2017 Form Type Recipient Paper Patient LB Electronic Signature(s) Signed: 01/30/2017 9:06:38 AM By: Ruthine Dose Entered By: Ruthine Dose on 01/30/2017 09:06:37 [name] ([id number]) -------------------------------------------------------------------------------- Lower Extremity Assessment Details Patient Name: [name] Date of Service: 01/30/2017 8:00 AM Medical Record Number: [id number] Patient Account Number: [id number] Date of Birth/Sex: 1919-07-14 (83 y.o. Male) Treating RN: Ahmed Prima Primary Care Lei Dower: Jenna Luo Other Clinician: Referring  Nakhi Choi: Jenna Luo Treating Alferd Obryant/Extender: Frann Rider in Treatment: 0 Edema Assessment Assessed: [Left: No] [Right: No] E[Left: dema] [Right: :] Calf Left: Right: Point of Measurement: 36 cm From Medial Instep 36 cm 37.1 cm Ankle Left: Right: Point of Measurement: 12 cm From Medial Instep 23 cm 24.5 cm Vascular Assessment Pulses: Dorsalis Pedis Palpable: [Left:No] [Right:No] Doppler Audible: [Left:Yes] [Right:Yes] Posterior Tibial Extremity colors, hair growth, and conditions: Extremity Color: [Left:Red] [Right:Red] Hair Growth on Extremity: [Left:Yes] [Right:Yes] Temperature of Extremity: [Left:Cool] [Right:Cool] Capillary Refill: [Left:> 3 seconds] [Right:> 3 seconds] Blood Pressure: Brachial: [Left:111] Dorsalis Pedis: 112 [Left:Dorsalis Pedis:] Ankle: Posterior Tibial: 122 [Left:Posterior Tibial: 1.10] Toe Nail Assessment Left: Right: Thick: Yes Yes Discolored: Yes Yes Deformed: Yes Yes Improper Length and Hygiene: No No Notes Birkey, Jariah T. ([id number]) unable to hear pulse in left foot it is there at first and then it fades with doppler Electronic Signature(s) Signed: 01/30/2017 2:39:38 PM By: Alric Quan Entered By: Alric Quan on 01/30/2017 08:35:03 [name] ([id number]) -------------------------------------------------------------------------------- Multi Wound Chart Details Patient Name: [name] Date of Service: 01/30/2017 8:00 AM Medical Record Number: [id number] Patient Account Number: [id number] Date of Birth/Sex: 04/12/1919 (83 y.o. Male) Treating RN: Ahmed Prima Primary Care Brennan Karam: Jenna Luo Other Clinician: Referring Cydnee Fuquay: Jenna Luo Treating Chrisie Jankovich/Extender: Frann Rider in Treatment: 0 Vital Signs Height(in): 73 Pulse(bpm): 78 Weight(lbs): 205 Blood Pressure 111/77 (mmHg): Body Mass Index(BMI): 27 Temperature(F): Respiratory Rate 16 (breaths/min): Photos: [1:No  Photos] [2:No Photos] [N/A:N/A] Wound Location: [1:Left Foot - Distal] [2:Right Toe - Web between 1st and 2nd] [N/A:N/A] Wounding Event: [1:Gradually Appeared] [2:Gradually Appeared] [N/A:N/A] Primary Etiology: [1:Atypical] [2:Atypical] [N/A:N/A] Comorbid History: [1:Cataracts, Hypertension, Cataracts, Hypertension, Peripheral Venous Disease, Received Chemotherapy, Received Chemotherapy, Received Radiation] [2:Peripheral Venous Disease, Received Radiation] [N/A:N/A] Date Acquired: [1:01/16/2017] [2:01/16/2017] [N/A:N/A] Weeks of Treatment: [1:0] [2:0] [N/A:N/A] Wound Status: [1:Open] [2:Open] [N/A:N/A] Measurements L x W x D 2.5x5x0.1 [2:0.5x2.1x0.1] [N/A:N/A] (cm) Area (cm) : [1:9.817] [2:0.825] [N/A:N/A] Volume (cm) : [1:0.982] [2:0.082] [N/A:N/A] Classification: [1:Partial Thickness] [2:Partial Thickness] [N/A:N/A] Exudate Amount: [1:Large] [2:Large] [N/A:N/A] Exudate Type: [1:Purulent] [2:Purulent] [N/A:N/A] Exudate Color: [1:yellow, brown, green] [2:yellow, brown, green] [N/A:N/A] Wound Margin: [1:Distinct, outline attached Distinct, outline attached] [N/A:N/A] Granulation Amount: [1:None Present (0%)] [2:None Present (0%)] [N/A:N/A] Necrotic Amount: [1:Large (67-100%)] [2:Large (67-100%)] [N/A:N/A] Necrotic Tissue: [1:Eschar, Adherent Slough Eschar, Adherent Slough] [N/A:N/A] Exposed Structures: [  1:Fascia: No Fat Layer (Subcutaneous Fat Layer (Subcutaneous Tissue) Exposed: No Tendon: No Muscle: No] [2:Fascia: No Tissue) Exposed: No Tendon: No Muscle: No] [N/A:N/A] Joint: No Joint: No Bone: No Bone: No Limited to Skin Limited to Skin Breakdown Breakdown Epithelialization: None None N/A Debridement: Debridement (12248- Debridement (25003- N/A 11047) 11047) Pre-procedure 08:44 08:44 N/A Verification/Time Out Taken: Pain Control: Lidocaine 4% Topical Lidocaine 4% Topical N/A Solution Solution Tissue Debrided: Fibrin/Slough, Exudates, Fibrin/Slough, Exudates, N/A Subcutaneous  Subcutaneous Level: Skin/Subcutaneous Skin/Subcutaneous N/A Tissue Tissue Debridement Area (sq 12.5 1.05 N/A cm): Instrument: Forceps, Scissors Forceps, Scissors N/A Bleeding: Minimum Minimum N/A Hemostasis Achieved: Pressure Pressure N/A Procedural Pain: 0 0 N/A Post Procedural Pain: 0 0 N/A Debridement Treatment Procedure was tolerated Procedure was tolerated N/A Response: well well Post Debridement 2.5x5x0.1 0.5x2.1x0.1 N/A Measurements L x W x D (cm) Post Debridement 0.982 0.082 N/A Volume: (cm) Periwound Skin Texture: No Abnormalities Noted No Abnormalities Noted N/A Periwound Skin Dry/Scaly: Yes Dry/Scaly: Yes N/A Moisture: Periwound Skin Color: No Abnormalities Noted No Abnormalities Noted N/A Temperature: No Abnormality No Abnormality N/A Tenderness on Yes Yes N/A Palpation: Wound Preparation: Ulcer Cleansing: Ulcer Cleansing: N/A Rinsed/Irrigated with Rinsed/Irrigated with Saline Saline Topical Anesthetic Topical Anesthetic Applied: Other: lidocaine Applied: Other: lidocaine 4% 4% Procedures Performed: Debridement Debridement N/A Treatment Notes Wound #1 (Left, Distal Foot) 1. Cleansed with: [name] ([id number]) Clean wound with Normal Saline 2. Anesthetic Topical Lidocaine 4% cream to wound bed prior to debridement 3. Peri-wound Care: Antifungal cream 5. Secondary Dressing Applied Gauze and Kerlix/Conform 7. Secured with Tape Wound #2 (Right Toe - Web between 1st and 2nd) 1. Cleansed with: Clean wound with Normal Saline 2. Anesthetic Topical Lidocaine 4% cream to wound bed prior to debridement 3. Peri-wound Care: Antifungal cream 5. Secondary Dressing Applied Gauze and Kerlix/Conform 7. Secured with Recruitment consultant) Signed: 01/30/2017 8:58:58 AM By: Christin Fudge MD, FACS Entered By: Christin Fudge on 01/30/2017 08:58:58 [name]  ([id number]) -------------------------------------------------------------------------------- [place] Details Patient Name: [name] Date of Service: 01/30/2017 8:00 AM Medical Record Number: [id number] Patient Account Number: [id number] Date of Birth/Sex: [birth year] (83 y.o. Male) Treating RN: Ahmed Prima Primary Care Ritchie Klee: Jenna Luo Other Clinician: Referring Caralyn Twining: Jenna Luo Treating Caileen Veracruz/Extender: Frann Rider in Treatment: 0 Active Inactive ` Abuse / Safety / Falls / Self Care Management Nursing Diagnoses: Potential for falls Goals: Patient will remain injury free Date Initiated: 01/30/2017 Target Resolution Date: 06/03/2017 Goal Status: Active Interventions: Assess fall risk on admission and as needed Assess impairment of mobility on admission and as needed per policy Assess self care needs on admission and as needed Notes: ` Nutrition Nursing Diagnoses: Imbalanced nutrition Goals: Patient/caregiver agrees to and verbalizes understanding of need to use nutritional supplements and/or vitamins as prescribed Date Initiated: 01/30/2017 Target Resolution Date: 06/03/2017 Goal Status: Active Interventions: Assess patient nutrition upon admission and as needed per policy Notes: ` Orientation to the Wound Care Program [place], [place] T. ([id number]) Nursing Diagnoses: Knowledge deficit related to the wound healing center program Goals: Patient/caregiver will verbalize understanding of the [place] Date Initiated: 01/30/2017 Target Resolution Date: 03/04/2017 Goal Status: Active Interventions: Provide education on orientation to the wound center Notes: ` Pain, Acute or Chronic Nursing Diagnoses: Pain, acute or chronic: actual or potential Potential alteration in comfort, pain Goals: Patient/caregiver will verbalize adequate pain control between visits Date Initiated: 01/30/2017 Target Resolution Date:  06/03/2017 Goal Status: Active Interventions: Assess comfort goal upon admission Complete pain assessment  as per visit requirements Notes: ` Wound/Skin Impairment Nursing Diagnoses: Impaired tissue integrity Knowledge deficit related to ulceration/compromised skin integrity Goals: Ulcer/skin breakdown will have a volume reduction of 80% by week 12 Date Initiated: 01/30/2017 Target Resolution Date: 05/27/2017 Goal Status: Active Interventions: Assess patient/caregiver ability to perform ulcer/skin care regimen upon admission and as needed Assess ulceration(s) every visit EFFIE, JANOSKI ([id number]) Notes: Electronic Signature(s) Signed: 01/30/2017 2:39:38 PM By: Alric Quan Entered By: Alric Quan on 01/30/2017 08:43:54 [name] ([id number]) -------------------------------------------------------------------------------- Pain Assessment Details Patient Name: [name] Date of Service: 01/30/2017 8:00 AM Medical Record Number: [id number] Patient Account Number: [id number] Date of Birth/Sex: [birth year] (83 y.o. Male) Treating RN: Ahmed Prima Primary Care Donoven Pett: Jenna Luo Other Clinician: Referring Beryl Balz: Jenna Luo Treating Everado Pillsbury/Extender: Frann Rider in Treatment: 0 Active Problems Location of Pain Severity and Description of Pain Patient Has Paino Yes Site Locations Pain Location: Pain in Ulcers With Dressing Change: Yes Rate the pain. Current Pain Level: 5 Character of Pain Describe the Pain: Aching Pain Management and Medication Current Pain Management: Electronic Signature(s) Signed: 01/30/2017 2:39:38 PM By: Alric Quan Entered By: Alric Quan on 01/30/2017 08:13:32 [name] ([id number]) -------------------------------------------------------------------------------- Patient/Caregiver Education Details Patient Name: [name] Date of Service: 01/30/2017 8:00 AM Medical Record Number:  [id number] Patient Account Number: [id number] Date of Birth/Gender: 06/03/1919 (83 y.o. Male) Treating RN: Ahmed Prima Primary Care Physician: Jenna Luo Other Clinician: Referring Physician: Jenna Luo Treating Physician/Extender: Frann Rider in Treatment: 0 Education Assessment Education Provided To: Patient and Caregiver Education Topics Provided Welcome To The [place]: Handouts: Welcome To [facility] [place] Methods: Explain/Verbal Responses: State content correctly Wound/Skin Impairment: Handouts: Other: change dressing as ordered Methods: Demonstration, Explain/Verbal Responses: State content correctly Electronic Signature(s) Signed: 01/30/2017 2:39:38 PM By: Alric Quan Entered By: Alric Quan on 01/30/2017 09:04:04 [name] ([id number]) -------------------------------------------------------------------------------- Wound Assessment Details Patient Name: [name] Date of Service: 01/30/2017 8:00 AM Medical Record Number: [id number] Patient Account Number: [id number] Date of Birth/Sex: 1919-04-23 (83 y.o. Male) Treating RN: Ahmed Prima Primary Care Somnang Mahan: Jenna Luo Other Clinician: Referring Jeannie Mallinger: Jenna Luo Treating Sarissa Dern/Extender: Frann Rider in Treatment: 0 Wound Status Wound Number: 1 Primary Atypical Etiology: Wound Location: Left Foot - Distal Wound Open Wounding Event: Gradually Appeared Status: Date Acquired: 01/16/2017 Comorbid Cataracts, Hypertension, Peripheral Weeks Of Treatment: 0 History: Venous Disease, Received Clustered Wound: No Chemotherapy, Received Radiation Photos Photo Uploaded By: Alric Quan on 01/30/2017 09:17:30 Wound Measurements Length: (cm) 2.5 Width: (cm) 5 Depth: (cm) 0.1 Area: (cm) 9.817 Volume: (cm) 0.982 % Reduction in Area: % Reduction in Volume: Epithelialization: None Tunneling: No Undermining: No Wound  Description Classification: Partial Thickness Foul Odor Afte Wound Margin: Distinct, outline attached Slough/Fibrino Exudate Amount: Large Exudate Type: Purulent Exudate Color: yellow, brown, green r Cleansing: No No Wound Bed Granulation Amount: None Present (0%) Exposed Structure Necrotic Amount: Large (67-100%) Fascia Exposed: No Necrotic Quality: Eschar, Adherent Slough Fat Layer (Subcutaneous Tissue) Exposed: No Tendon Exposed: No Heidecker, Larence T. ([id number]) Muscle Exposed: No Joint Exposed: No Bone Exposed: No Limited to Skin Breakdown Periwound Skin Texture Texture Color No Abnormalities Noted: No No Abnormalities Noted: No Moisture Temperature / Pain No Abnormalities Noted: No Temperature: No Abnormality Dry / Scaly: Yes Tenderness on Palpation: Yes Wound Preparation Ulcer Cleansing: Rinsed/Irrigated with Saline Topical Anesthetic Applied: Other: lidocaine 4%, Treatment Notes Wound #1 (Left, Distal Foot) 1. Cleansed with: Clean wound with Normal Saline 2. Anesthetic Topical Lidocaine  4% cream to wound bed prior to debridement 3. Peri-wound Care: Antifungal cream 5. Secondary Dressing Applied Gauze and Kerlix/Conform 7. Secured with Recruitment consultant) Signed: 01/30/2017 2:39:38 PM By: Alric Quan Entered By: Alric Quan on 01/30/2017 25:36:64 [name] ([id number]) -------------------------------------------------------------------------------- Wound Assessment Details Patient Name: [name] Date of Service: 01/30/2017 8:00 AM Medical Record Number: [id number] Patient Account Number: [id number] Date of Birth/Sex: 07-25-1919 (83 y.o. Male) Treating RN: Ahmed Prima Primary Care Jenean Escandon: Jenna Luo Other Clinician: Referring Michaiah Maiden: Jenna Luo Treating Khaleef Ruby/Extender: Frann Rider in Treatment: 0 Wound Status Wound Number: 2 Primary Atypical Etiology: Wound Location: Right Toe - Web between 1st and  2nd Wound Open Status: Wounding Event: Gradually Appeared Comorbid Cataracts, Hypertension, Peripheral Date Acquired: 01/16/2017 History: Venous Disease, Received Weeks Of Treatment: 0 Chemotherapy, Received Radiation Clustered Wound: No Photos Photo Uploaded By: Alric Quan on 01/30/2017 09:17:44 Wound Measurements Length: (cm) 0.5 Width: (cm) 2.1 Depth: (cm) 0.1 Area: (cm) 0.825 Volume: (cm) 0.082 % Reduction in Area: % Reduction in Volume: Epithelialization: None Tunneling: No Undermining: No Wound Description Classification: Partial Thickness Foul Odor Afte Wound Margin: Distinct, outline attached Slough/Fibrino Exudate Amount: Large Exudate Type: Purulent Exudate Color: yellow, brown, green r Cleansing: No No Wound Bed Granulation Amount: None Present (0%) Exposed Structure Necrotic Amount: Large (67-100%) Fascia Exposed: No Necrotic Quality: Eschar, Adherent Slough Fat Layer (Subcutaneous Tissue) Exposed: No Ridley, Tavarious T. ([id number]) Tendon Exposed: No Muscle Exposed: No Joint Exposed: No Bone Exposed: No Limited to Skin Breakdown Periwound Skin Texture Texture Color No Abnormalities Noted: No No Abnormalities Noted: No Moisture Temperature / Pain No Abnormalities Noted: No Temperature: No Abnormality Dry / Scaly: Yes Tenderness on Palpation: Yes Wound Preparation Ulcer Cleansing: Rinsed/Irrigated with Saline Topical Anesthetic Applied: Other: lidocaine 4%, Treatment Notes Wound #2 (Right Toe - Web between 1st and 2nd) 1. Cleansed with: Clean wound with Normal Saline 2. Anesthetic Topical Lidocaine 4% cream to wound bed prior to debridement 3. Peri-wound Care: Antifungal cream 5. Secondary Dressing Applied Gauze and Kerlix/Conform 7. Secured with Recruitment consultant) Signed: 01/30/2017 2:39:38 PM By: Alric Quan Entered By: Alric Quan on 01/30/2017 08:31:10 [name]  ([id number]) -------------------------------------------------------------------------------- [place] Details Patient Name: [name] Date of Service: 01/30/2017 8:00 AM Medical Record Number: [id number] Patient Account Number: [id number] Date of Birth/Sex: 02/04/1919 (83 y.o. Male) Treating RN: Ahmed Prima Primary Care [place] Sorn: Jenna Luo Other Clinician: Referring Shabria Egley: Jenna Luo Treating Andriana Casa/Extender: Frann Rider in Treatment: 0 Vital Signs Time Taken: 08:13 Pulse (bpm): 78 Height (in): 73 Respiratory Rate (breaths/min): 16 Source: Stated Blood Pressure (mmHg): 111/77 Weight (lbs): 205 Reference Range: 80 - 120 mg / dl Source: Measured Body Mass Index (BMI): 27 Electronic Signature(s) Signed: 01/30/2017 2:39:38 PM By: Alric Quan Entered By: Alric Quan on 01/30/2017 08:14:59

## 2017-01-30 NOTE — Progress Notes (Signed)
SILVIA, MARKUSON ([id number]) Visit Report for 01/30/2017 Abuse/Suicide Risk Screen Details Patient Name: [name], [name] Date of Service: 01/30/2017 8:00 AM Medical Record Number: [id number] Patient Account Number: [id number] Date of Birth/Sex: Aug 19, 1919 (83 y.o. Male) Treating RN: Ahmed Prima Primary Care Joy Reiger: Jenna Luo Other Clinician: Referring Maxamilian Amadon: Jenna Luo Treating Arlie Riker/Extender: Frann Rider in Treatment: 0 Abuse/Suicide Risk Screen Items Answer ABUSE/SUICIDE RISK SCREEN: Has anyone close to you tried to hurt or harm you recentlyo No Do you feel uncomfortable with anyone in your familyo No Has anyone forced you do things that you didnot want to doo No Do you have any thoughts of harming yourselfo No Patient displays signs or symptoms of abuse and/or neglect. No Electronic Signature(s) Signed: 01/30/2017 2:39:38 PM By: Alric Quan Entered By: Alric Quan on 01/30/2017 08:23:29 [name] ([id number]) -------------------------------------------------------------------------------- Activities of Daily Living Details Patient Name: [name] Date of Service: 01/30/2017 8:00 AM Medical Record Number: [id number] Patient Account Number: [id number] Date of Birth/Sex: [birth year] (83 y.o. Male) Treating RN: Ahmed Prima Primary Care  Antolin: Jenna Luo Other Clinician: Referring Sacha Topor: Jenna Luo Treating Leiby Pigeon/Extender: Frann Rider in Treatment: 0 Activities of Daily Living Items Answer Activities of Daily Living (Please select one for each item) Drive Automobile Not Able Take Medications Need Assistance Use Telephone Completely Able Care for Appearance Completely Able Use Toilet Completely Able Bath / Shower Completely Able Dress Self Completely Able Feed Self Completely Able Walk Need Assistance Get In / Out Bed Completely [place] Need  Assistance Shop for Self Need Assistance Electronic Signature(s) Signed: 01/30/2017 2:39:38 PM By: Alric Quan Entered By: Alric Quan on 01/30/2017 08:24:15 [name] ([id number]) -------------------------------------------------------------------------------- Education Assessment Details Patient Name: [name] Date of Service: 01/30/2017 8:00 AM Medical Record Number: [id number] Patient Account Number: [id number] Date of Birth/Sex: 1919/01/03 (83 y.o. Male) Treating RN: Ahmed Prima Primary Care Janei Scheff: Jenna Luo Other Clinician: Referring Kameka Whan: Jenna Luo Treating Everest Brod/Extender: Frann Rider in Treatment: 0 Primary Learner Assessed: Patient Learning Preferences/Education Level/Primary Language Learning Preference: Explanation, Printed Material Highest Education Level: High School Preferred Language: English Cognitive Barrier Assessment/Beliefs Language Barrier: No Translator Needed: No Memory Deficit: No Emotional Barrier: No Cultural/Religious Beliefs Affecting Medical No Care: Physical Barrier Assessment Impaired Vision: No Impaired Hearing: No Decreased Hand dexterity: No Knowledge/Comprehension Assessment Knowledge Level: Medium Comprehension Level: Medium Ability to understand written Medium instructions: Ability to understand verbal Medium instructions: Motivation Assessment Anxiety Level: Calm Cooperation: Cooperative Education Importance: Acknowledges Need Interest in Health Problems: Asks Questions Perception: Coherent Willingness to Engage in Self- Medium Management Activities: Readiness to Engage in Self- Medium Management Activities: Electronic Signature(s) SHAHID, FLORI ([id number]) Signed: 01/30/2017 2:39:38 PM By: Alric Quan Entered By: Alric Quan on 01/30/2017 08:24:38 [name] ([id number]) -------------------------------------------------------------------------------- Fall  Risk Assessment Details Patient Name: [name] Date of Service: 01/30/2017 8:00 AM Medical Record Number: [id number] Patient Account Number: [id number] Date of Birth/Sex: 06/23/1919 (83 y.o. Male) Treating RN: Ahmed Prima Primary Care Monetta Lick: Jenna Luo Other Clinician: Referring Mickelle Goupil: Jenna Luo Treating Kooper Chriswell/Extender: Frann Rider in Treatment: 0 Fall Risk Assessment Items Have you had 2 or more falls in the last 12 monthso 0 No Have you had any fall that resulted in injury in the last 12 monthso 0 No FALL RISK ASSESSMENT: History of falling - immediate or within 3 months 0 No Secondary diagnosis 15 Yes Ambulatory aid None/bed rest/wheelchair/nurse 0 No Crutches/cane/walker 0 No Furniture  0 No IV Access/Saline Lock 0 No Gait/Training Normal/bed rest/immobile 0 No Weak 0 No Impaired 20 Yes Mental Status Oriented to own ability 0 Yes Electronic Signature(s) Signed: 01/30/2017 2:39:38 PM By: Alric Quan Entered By: Alric Quan on 01/30/2017 08:24:58 [name] ([id number]) -------------------------------------------------------------------------------- Foot Assessment Details Patient Name: [name] Date of Service: 01/30/2017 8:00 AM Medical Record Number: [id number] Patient Account Number: [id number] Date of Birth/Sex: 1919/10/08 (83 y.o. Male) Treating RN: Ahmed Prima Primary Care Jaydis Duchene: Jenna Luo Other Clinician: Referring Orli Degrave: Jenna Luo Treating Taysia Rivere/Extender: Frann Rider in Treatment: 0 Foot Assessment Items Site Locations + = Sensation present, - = Sensation absent, C = Callus, U = Ulcer R = Redness, W = Warmth, M = Maceration, PU = Pre-ulcerative lesion F = Fissure, S = Swelling, D = Dryness Assessment Right: Left: Other Deformity: No No Prior Foot Ulcer: No No Prior Amputation: No No Charcot Joint: No No Ambulatory Status: Ambulatory With Help Assistance Device:  Walker Gait: Steady Electronic Signature(s) Signed: 01/30/2017 2:39:38 PM By: Alric Quan Entered By: Alric Quan on 01/30/2017 08:25:45 [name] ([id number]) -------------------------------------------------------------------------------- Nutrition Risk Assessment Details Patient Name: [name] Date of Service: 01/30/2017 8:00 AM Medical Record Number: [id number] Patient Account Number: [id number] Date of Birth/Sex: 08/20/1919 (83 y.o. Male) Treating RN: Ahmed Prima Primary Care Kelda Azad: Jenna Luo Other Clinician: Referring Jamerius Boeckman: Jenna Luo Treating Hadley Detloff/Extender: Frann Rider in Treatment: 0 Height (in): 73 Weight (lbs): 205 Body Mass Index (BMI): 27 Nutrition Risk Assessment Items NUTRITION RISK SCREEN: I have an illness or condition that made me change the kind and/or 0 No amount of food I eat I eat fewer than two meals per day 0 No I eat few fruits and vegetables, or milk products 0 No I have three or more drinks of beer, liquor or wine almost every day 0 No I have tooth or mouth problems that make it hard for me to eat 0 No I don't always have enough money to buy the food I need 0 No I eat alone most of the time 0 No I take three or more different prescribed or over-the-counter drugs a 1 Yes day Without wanting to, I have lost or gained 10 pounds in the last six 0 No months I am not always physically able to shop, cook and/or feed myself 0 No Nutrition Protocols Good Risk Protocol Moderate Risk Protocol Electronic Signature(s) Signed: 01/30/2017 2:39:38 PM By: Alric Quan Entered By: Alric Quan on 01/30/2017 08:25:14

## 2017-01-31 DIAGNOSIS — L03116 Cellulitis of left lower limb: Secondary | ICD-10-CM | POA: Diagnosis not present

## 2017-01-31 DIAGNOSIS — Z87891 Personal history of nicotine dependence: Secondary | ICD-10-CM | POA: Diagnosis not present

## 2017-01-31 DIAGNOSIS — Z792 Long term (current) use of antibiotics: Secondary | ICD-10-CM | POA: Diagnosis not present

## 2017-01-31 DIAGNOSIS — L97521 Non-pressure chronic ulcer of other part of left foot limited to breakdown of skin: Secondary | ICD-10-CM | POA: Diagnosis not present

## 2017-01-31 DIAGNOSIS — Z89412 Acquired absence of left great toe: Secondary | ICD-10-CM | POA: Diagnosis not present

## 2017-01-31 DIAGNOSIS — I872 Venous insufficiency (chronic) (peripheral): Secondary | ICD-10-CM | POA: Diagnosis not present

## 2017-01-31 DIAGNOSIS — I739 Peripheral vascular disease, unspecified: Secondary | ICD-10-CM | POA: Diagnosis not present

## 2017-01-31 DIAGNOSIS — Z7982 Long term (current) use of aspirin: Secondary | ICD-10-CM | POA: Diagnosis not present

## 2017-01-31 DIAGNOSIS — Z8572 Personal history of non-Hodgkin lymphomas: Secondary | ICD-10-CM | POA: Diagnosis not present

## 2017-01-31 DIAGNOSIS — I10 Essential (primary) hypertension: Secondary | ICD-10-CM | POA: Diagnosis not present

## 2017-02-01 ENCOUNTER — Other Ambulatory Visit: Payer: Self-pay | Admitting: Family Medicine

## 2017-02-01 DIAGNOSIS — Z8572 Personal history of non-Hodgkin lymphomas: Secondary | ICD-10-CM | POA: Diagnosis not present

## 2017-02-01 DIAGNOSIS — I739 Peripheral vascular disease, unspecified: Secondary | ICD-10-CM | POA: Diagnosis not present

## 2017-02-01 DIAGNOSIS — Z89412 Acquired absence of left great toe: Secondary | ICD-10-CM | POA: Diagnosis not present

## 2017-02-01 DIAGNOSIS — L97521 Non-pressure chronic ulcer of other part of left foot limited to breakdown of skin: Secondary | ICD-10-CM | POA: Diagnosis not present

## 2017-02-01 DIAGNOSIS — L03116 Cellulitis of left lower limb: Secondary | ICD-10-CM | POA: Diagnosis not present

## 2017-02-01 DIAGNOSIS — Z7982 Long term (current) use of aspirin: Secondary | ICD-10-CM | POA: Diagnosis not present

## 2017-02-01 DIAGNOSIS — I10 Essential (primary) hypertension: Secondary | ICD-10-CM | POA: Diagnosis not present

## 2017-02-01 DIAGNOSIS — Z87891 Personal history of nicotine dependence: Secondary | ICD-10-CM | POA: Diagnosis not present

## 2017-02-01 DIAGNOSIS — I872 Venous insufficiency (chronic) (peripheral): Secondary | ICD-10-CM | POA: Diagnosis not present

## 2017-02-01 DIAGNOSIS — Z792 Long term (current) use of antibiotics: Secondary | ICD-10-CM | POA: Diagnosis not present

## 2017-02-01 NOTE — Telephone Encounter (Signed)
Ok to refill 

## 2017-02-02 ENCOUNTER — Ambulatory Visit: Payer: Medicare Other | Admitting: Podiatry

## 2017-02-02 DIAGNOSIS — Z792 Long term (current) use of antibiotics: Secondary | ICD-10-CM | POA: Diagnosis not present

## 2017-02-02 DIAGNOSIS — Z8572 Personal history of non-Hodgkin lymphomas: Secondary | ICD-10-CM | POA: Diagnosis not present

## 2017-02-02 DIAGNOSIS — I739 Peripheral vascular disease, unspecified: Secondary | ICD-10-CM | POA: Diagnosis not present

## 2017-02-02 DIAGNOSIS — Z89412 Acquired absence of left great toe: Secondary | ICD-10-CM | POA: Diagnosis not present

## 2017-02-02 DIAGNOSIS — Z87891 Personal history of nicotine dependence: Secondary | ICD-10-CM | POA: Diagnosis not present

## 2017-02-02 DIAGNOSIS — I10 Essential (primary) hypertension: Secondary | ICD-10-CM | POA: Diagnosis not present

## 2017-02-02 DIAGNOSIS — Z7982 Long term (current) use of aspirin: Secondary | ICD-10-CM | POA: Diagnosis not present

## 2017-02-02 DIAGNOSIS — I872 Venous insufficiency (chronic) (peripheral): Secondary | ICD-10-CM | POA: Diagnosis not present

## 2017-02-02 DIAGNOSIS — L03116 Cellulitis of left lower limb: Secondary | ICD-10-CM | POA: Diagnosis not present

## 2017-02-02 DIAGNOSIS — L97521 Non-pressure chronic ulcer of other part of left foot limited to breakdown of skin: Secondary | ICD-10-CM | POA: Diagnosis not present

## 2017-02-02 NOTE — Telephone Encounter (Signed)
ok 

## 2017-02-02 NOTE — Telephone Encounter (Signed)
Medication called to pharmacy. 

## 2017-02-03 ENCOUNTER — Encounter: Payer: Self-pay | Admitting: Vascular Surgery

## 2017-02-03 DIAGNOSIS — L97521 Non-pressure chronic ulcer of other part of left foot limited to breakdown of skin: Secondary | ICD-10-CM | POA: Diagnosis not present

## 2017-02-03 DIAGNOSIS — I739 Peripheral vascular disease, unspecified: Secondary | ICD-10-CM | POA: Diagnosis not present

## 2017-02-03 DIAGNOSIS — Z87891 Personal history of nicotine dependence: Secondary | ICD-10-CM | POA: Diagnosis not present

## 2017-02-03 DIAGNOSIS — L03116 Cellulitis of left lower limb: Secondary | ICD-10-CM | POA: Diagnosis not present

## 2017-02-03 DIAGNOSIS — Z7982 Long term (current) use of aspirin: Secondary | ICD-10-CM | POA: Diagnosis not present

## 2017-02-03 DIAGNOSIS — Z89412 Acquired absence of left great toe: Secondary | ICD-10-CM | POA: Diagnosis not present

## 2017-02-03 DIAGNOSIS — Z8572 Personal history of non-Hodgkin lymphomas: Secondary | ICD-10-CM | POA: Diagnosis not present

## 2017-02-03 DIAGNOSIS — I10 Essential (primary) hypertension: Secondary | ICD-10-CM | POA: Diagnosis not present

## 2017-02-03 DIAGNOSIS — Z792 Long term (current) use of antibiotics: Secondary | ICD-10-CM | POA: Diagnosis not present

## 2017-02-03 DIAGNOSIS — I872 Venous insufficiency (chronic) (peripheral): Secondary | ICD-10-CM | POA: Diagnosis not present

## 2017-02-06 ENCOUNTER — Encounter: Payer: Medicare Other | Admitting: Surgery

## 2017-02-06 DIAGNOSIS — I70235 Atherosclerosis of native arteries of right leg with ulceration of other part of foot: Secondary | ICD-10-CM | POA: Diagnosis not present

## 2017-02-06 DIAGNOSIS — Z9221 Personal history of antineoplastic chemotherapy: Secondary | ICD-10-CM | POA: Diagnosis not present

## 2017-02-06 DIAGNOSIS — B353 Tinea pedis: Secondary | ICD-10-CM | POA: Diagnosis not present

## 2017-02-06 DIAGNOSIS — I10 Essential (primary) hypertension: Secondary | ICD-10-CM | POA: Diagnosis not present

## 2017-02-06 DIAGNOSIS — Z87891 Personal history of nicotine dependence: Secondary | ICD-10-CM | POA: Diagnosis not present

## 2017-02-06 DIAGNOSIS — B369 Superficial mycosis, unspecified: Secondary | ICD-10-CM | POA: Diagnosis not present

## 2017-02-06 DIAGNOSIS — I70245 Atherosclerosis of native arteries of left leg with ulceration of other part of foot: Secondary | ICD-10-CM | POA: Diagnosis not present

## 2017-02-06 DIAGNOSIS — Z79899 Other long term (current) drug therapy: Secondary | ICD-10-CM | POA: Diagnosis not present

## 2017-02-06 DIAGNOSIS — Z89412 Acquired absence of left great toe: Secondary | ICD-10-CM | POA: Diagnosis not present

## 2017-02-06 DIAGNOSIS — Z8572 Personal history of non-Hodgkin lymphomas: Secondary | ICD-10-CM | POA: Diagnosis not present

## 2017-02-06 DIAGNOSIS — L97529 Non-pressure chronic ulcer of other part of left foot with unspecified severity: Secondary | ICD-10-CM | POA: Diagnosis not present

## 2017-02-06 DIAGNOSIS — I872 Venous insufficiency (chronic) (peripheral): Secondary | ICD-10-CM | POA: Diagnosis not present

## 2017-02-06 DIAGNOSIS — Z923 Personal history of irradiation: Secondary | ICD-10-CM | POA: Diagnosis not present

## 2017-02-06 DIAGNOSIS — Z7982 Long term (current) use of aspirin: Secondary | ICD-10-CM | POA: Diagnosis not present

## 2017-02-06 DIAGNOSIS — L97519 Non-pressure chronic ulcer of other part of right foot with unspecified severity: Secondary | ICD-10-CM | POA: Diagnosis not present

## 2017-02-06 NOTE — Progress Notes (Signed)
ABASS, MISENER ([id number]) Visit Report for 02/06/2017 Arrival Information Details Patient Name: Jimmy, Rodriguez Date of Service: 02/06/2017 11:00 AM Medical Record Number: [id number] Patient Account Number: [id number] Date of Birth/Sex: [birth year] (83 y.o. Male) Treating RN: Ahmed Prima Primary Care Harace Mccluney: Jenna Luo Other Clinician: Referring Wolfgang Finigan: Jenna Luo Treating Kalik Hoare/Extender: Frann Rider in Treatment: 1 Visit Information History Since Last Visit All ordered tests and consults were completed: No Patient Arrived: Wheel Chair Added or deleted any medications: No Arrival Time: 10:56 Any new allergies or adverse reactions: No Accompanied By: son Had a fall or experienced change in No Transfer Assistance: EasyPivot activities of daily living that may affect Patient Lift risk of falls: Patient Identification Verified: Yes Signs or symptoms of abuse/neglect since last No Secondary Verification Process Yes visito Completed: Hospitalized since last visit: No Patient Requires Transmission- No Has Dressing in Place as Prescribed: Yes Based Precautions: Pain Present Now: No Patient Has Alerts: No Electronic Signature(s) Signed: 02/06/2017 12:02:17 PM By: Alric Quan Entered By: Alric Quan on 02/06/2017 10:57:18 [name] ([id number]) -------------------------------------------------------------------------------- Clinic Level of Care Assessment Details Patient Name: [name] Date of Service: 02/06/2017 11:00 AM Medical Record Number: [id number] Patient Account Number: [id number] Date of Birth/Sex: Aug 22, 1919 (83 y.o. Male) Treating RN: Ahmed Prima Primary Care Josey Forcier: Jenna Luo Other Clinician: Referring Jayveion Stalling: Jenna Luo Treating Asaiah Scarber/Extender: Frann Rider in Treatment: 1 Clinic Level of Care Assessment Items TOOL 4 Quantity Score X - Use when only an EandM is performed on FOLLOW-UP visit  1 0 ASSESSMENTS - Nursing Assessment / Reassessment X - Reassessment of Co-morbidities (includes updates in patient status) 1 10 X - Reassessment of Adherence to Treatment Plan 1 5 ASSESSMENTS - Wound and Skin Assessment / Reassessment []  - Simple Wound Assessment / Reassessment - one wound 0 X - Complex Wound Assessment / Reassessment - multiple wounds 2 5 []  - Dermatologic / Skin Assessment (not related to wound area) 0 ASSESSMENTS - Focused Assessment []  - Circumferential Edema Measurements - multi extremities 0 []  - Nutritional Assessment / Counseling / Intervention 0 []  - Lower Extremity Assessment (monofilament, tuning fork, pulses) 0 []  - Peripheral Arterial Disease Assessment (using hand held doppler) 0 ASSESSMENTS - Ostomy and/or Continence Assessment and Care []  - Incontinence Assessment and Management 0 []  - Ostomy Care Assessment and Management (repouching, etc.) 0 PROCESS - Coordination of Care X - Simple Patient / Family Education for ongoing care 1 15 []  - Complex (extensive) Patient / Family Education for ongoing care 0 []  - Staff obtains Programmer, systems, Records, Test Results / Process Orders 0 []  - Staff telephones HHA, Nursing Homes / Clarify orders / etc 0 []  - Routine Transfer to another Facility (non-emergent condition) 0 Rodriguez, Jimmy T. ([id number]) []  - Routine Hospital Admission (non-emergent condition) 0 []  - New Admissions / Biomedical engineer / Ordering NPWT, Apligraf, etc. 0 []  - Emergency Hospital Admission (emergent condition) 0 X - Simple Discharge Coordination 1 10 []  - Complex (extensive) Discharge Coordination 0 PROCESS - Special Needs []  - Pediatric / Minor Patient Management 0 []  - Isolation Patient Management 0 []  - Hearing / Language / Visual special needs 0 []  - Assessment of Community assistance (transportation, D/C planning, etc.) 0 []  - Additional assistance / Altered mentation 0 []  - Support Surface(s) Assessment (bed, cushion, seat, etc.)  0 INTERVENTIONS - Wound Cleansing / Measurement []  - Simple Wound Cleansing - one wound 0 X - Complex Wound Cleansing - multiple wounds 2 5 X -  Wound Imaging (photographs - any number of wounds) 1 5 []  - Wound Tracing (instead of photographs) 0 []  - Simple Wound Measurement - one wound 0 []  - Complex Wound Measurement - multiple wounds 0 INTERVENTIONS - Wound Dressings []  - Small Wound Dressing one or multiple wounds 0 []  - Medium Wound Dressing one or multiple wounds 0 []  - Large Wound Dressing one or multiple wounds 0 []  - Application of Medications - topical 0 []  - Application of Medications - injection 0 INTERVENTIONS - Miscellaneous []  - External ear exam 0 Rodriguez, Jimmy T. ([id number]) []  - Specimen Collection (cultures, biopsies, blood, body fluids, etc.) 0 []  - Specimen(s) / Culture(s) sent or taken to Lab for analysis 0 []  - Patient Transfer (multiple staff / Harrel Lemon Lift / Similar devices) 0 []  - Simple Staple / Suture removal (25 or less) 0 []  - Complex Staple / Suture removal (26 or more) 0 []  - Hypo / Hyperglycemic Management (close monitor of Blood Glucose) 0 []  - Ankle / Brachial Index (ABI) - do not check if billed separately 0 X - Vital Signs 1 5 Has the patient been seen at the hospital within the last three years: Yes Total Score: 70 Level Of Care: New/Established - Level 2 Electronic Signature(s) Signed: 02/06/2017 12:02:17 PM By: Alric Quan Entered By: Alric Quan on 02/06/2017 11:54:44 [name] ([id number]) -------------------------------------------------------------------------------- Encounter Discharge Information Details Patient Name: [name] Date of Service: 02/06/2017 11:00 AM Medical Record Number: [id number] Patient Account Number: [id number] Date of Birth/Sex: 12/05/1918 (83 y.o. Male) Treating RN: Ahmed Prima Primary Care Johnothan Bascomb: Jenna Luo Other Clinician: Referring Cashawn Yanko: Jenna Luo Treating  Oveta Idris/Extender: Frann Rider in Treatment: 1 Encounter Discharge Information Items Discharge Pain Level: 0 Discharge Condition: Stable Ambulatory Status: Wheelchair Discharge Destination: Home Transportation: Private Auto Accompanied By: son Schedule Follow-up Appointment: No Medication Reconciliation completed and provided to Patient/Care No Sivan Cuello: Provided on Clinical Summary of Care: 02/06/2017 Form Type Recipient Paper Patient LB Electronic Signature(s) Signed: 02/06/2017 11:24:46 AM By: Ruthine Dose Entered By: Ruthine Dose on 02/06/2017 11:24:46 [name] ([id number]) -------------------------------------------------------------------------------- Lower Extremity Assessment Details Patient Name: [name] Date of Service: 02/06/2017 11:00 AM Medical Record Number: [id number] Patient Account Number: [id number] Date of Birth/Sex: 1919-09-20 (83 y.o. Male) Treating RN: Ahmed Prima Primary Care Serayah Yazdani: Jenna Luo Other Clinician: Referring Tylik Treese: Jenna Luo Treating Roshana Shuffield/Extender: Frann Rider in Treatment: 1 Vascular Assessment Pulses: Dorsalis Pedis Palpable: [Left:No] [Right:No] Doppler Audible: [Left:Yes] [Right:Yes] Posterior Tibial Extremity colors, hair growth, and conditions: Extremity Color: [Left:Hyperpigmented] [Right:Hyperpigmented] Temperature of Extremity: [Left:Warm] [Right:Warm] Electronic Signature(s) Signed: 02/06/2017 12:02:17 PM By: Alric Quan Entered By: Alric Quan on 02/06/2017 11:12:39 [name] ([id number]) -------------------------------------------------------------------------------- Multi Wound Chart Details Patient Name: [name] Date of Service: 02/06/2017 11:00 AM Medical Record Number: [id number] Patient Account Number: [id number] Date of Birth/Sex: 10-27-1919 (83 y.o. Male) Treating RN: Ahmed Prima Primary Care Ronnie Mallette: Jenna Luo Other  Clinician: Referring Charlett Merkle: Jenna Luo Treating Rayshell Goecke/Extender: Frann Rider in Treatment: 1 Vital Signs Height(in): 73 Pulse(bpm): 68 Weight(lbs): 205 Blood Pressure 158/86 (mmHg): Body Mass Index(BMI): 27 Temperature(F): 97.5 Respiratory Rate 16 (breaths/min): Photos: [1:No Photos] [2:No Photos] [N/A:N/A] Wound Location: [1:Left Foot - Distal] [2:Right Toe - Web between 1st and 2nd] [N/A:N/A] Wounding Event: [1:Gradually Appeared] [2:Gradually Appeared] [N/A:N/A] Primary Etiology: [1:Atypical] [2:Atypical] [N/A:N/A] Comorbid History: [1:Cataracts, Hypertension, Cataracts, Hypertension, Peripheral Venous Disease, Received Chemotherapy, Received Chemotherapy, Received Radiation] [2:Peripheral Venous Disease, Received Radiation] [N/A:N/A] Date Acquired: [1:01/16/2017] [2:01/16/2017] [N/A:N/A] Weeks of  Treatment: [1:1] [2:1] [N/A:N/A] Wound Status: [1:Healed - Epithelialized] [2:Healed - Epithelialized] [N/A:N/A] Measurements L x W x D 0x0x0 [2:0x0x0] [N/A:N/A] (cm) Area (cm) : [1:0] [2:0] [N/A:N/A] Volume (cm) : [1:0] [2:0] [N/A:N/A] % Reduction in Area: [1:100.00%] [2:100.00%] [N/A:N/A] % Reduction in Volume: 100.00% [2:100.00%] [N/A:N/A] Classification: [1:Partial Thickness] [2:Partial Thickness] [N/A:N/A] Exudate Amount: [1:None Present] [2:None Present] [N/A:N/A] Wound Margin: [1:Distinct, outline attached Distinct, outline attached] [N/A:N/A] Granulation Amount: [1:None Present (0%)] [2:None Present (0%)] [N/A:N/A] Necrotic Amount: [1:None Present (0%)] [2:None Present (0%)] [N/A:N/A] Exposed Structures: [1:Fascia: No Fat Layer (Subcutaneous Fat Layer (Subcutaneous Tissue) Exposed: No Tendon: No Muscle: No Joint: No] [2:Fascia: No Tissue) Exposed: No Tendon: No Muscle: No Joint: No] [N/A:N/A] Bone: No Bone: No Limited to Skin Limited to Skin Breakdown Breakdown Epithelialization: None Large (67-100%) N/A Periwound Skin Texture: No Abnormalities Noted  No Abnormalities Noted N/A Periwound Skin Dry/Scaly: Yes Dry/Scaly: Yes N/A Moisture: Periwound Skin Color: No Abnormalities Noted No Abnormalities Noted N/A Temperature: No Abnormality No Abnormality N/A Tenderness on Yes Yes N/A Palpation: Wound Preparation: Ulcer Cleansing: Ulcer Cleansing: N/A Rinsed/Irrigated with Rinsed/Irrigated with Saline Saline Topical Anesthetic Topical Anesthetic Applied: None Applied: None Treatment Notes Electronic Signature(s) Signed: 02/06/2017 11:24:35 AM By: Christin Fudge MD, FACS Entered By: Christin Fudge on 02/06/2017 11:24:34 [name] ([id number]) -------------------------------------------------------------------------------- Multi-Disciplinary Care Plan Details Patient Name: [name] Date of Service: 02/06/2017 11:00 AM Medical Record Number: [id number] Patient Account Number: [id number] Date of Birth/Sex: 07-Apr-1919 (83 y.o. Male) Treating RN: Ahmed Prima Primary Care Trevone Prestwood: Jenna Luo Other Clinician: Referring Ekin Pilar: Jenna Luo Treating Rodrigo Mcgranahan/Extender: Frann Rider in Treatment: 1 Active Inactive Electronic Signature(s) Signed: 02/06/2017 12:02:17 PM By: Alric Quan Entered By: Alric Quan on 02/06/2017 11:12:54 [name] ([id number]) -------------------------------------------------------------------------------- Pain Assessment Details Patient Name: [name] Date of Service: 02/06/2017 11:00 AM Medical Record Number: [id number] Patient Account Number: [id number] Date of Birth/Sex: [birth year] (83 y.o. Male) Treating RN: Ahmed Prima Primary Care Alyson Ki: Jenna Luo Other Clinician: Referring Pike Scantlebury: Jenna Luo Treating Micki Cassel/Extender: Frann Rider in Treatment: 1 Active Problems Location of Pain Severity and Description of Pain Patient Has Paino No Site Locations With Dressing Change: No Pain Management and Medication Current Pain  Management: Electronic Signature(s) Signed: 02/06/2017 12:02:17 PM By: Alric Quan Entered By: Alric Quan on 02/06/2017 10:57:26 [name] ([id number]) -------------------------------------------------------------------------------- Patient/Caregiver Education Details Patient Name: [name] Date of Service: 02/06/2017 11:00 AM Medical Record Number: [id number] Patient Account Number: [id number] Date of Birth/Gender: 08/31/1919 (83 y.o. Male) Treating RN: Ahmed Prima Primary Care Physician: Jenna Luo Other Clinician: Referring Physician: Jenna Luo Treating Physician/Extender: Frann Rider in Treatment: 1 Education Assessment Education Provided To: Patient and Caregiver Education Topics Provided Wound/Skin Impairment: Handouts: Other: Please call our office if you have any questions or concerns. Methods: Explain/Verbal Responses: State content correctly Electronic Signature(s) Signed: 02/06/2017 12:02:17 PM By: Alric Quan Entered By: Alric Quan on 02/06/2017 11:14:35 [name] ([id number]) -------------------------------------------------------------------------------- Wound Assessment Details Patient Name: [name] Date of Service: 02/06/2017 11:00 AM Medical Record Number: [id number] Patient Account Number: [id number] Date of Birth/Sex: 23-Nov-1919 (83 y.o. Male) Treating RN: Ahmed Prima Primary Care Raeshaun Simson: Jenna Luo Other Clinician: Referring Davidmichael Zarazua: Jenna Luo Treating Latrell Reitan/Extender: Frann Rider in Treatment: 1 Wound Status Wound Number: 1 Primary Atypical Etiology: Wound Location: Left Foot - Distal Wound Healed - Epithelialized Wounding Event: Gradually Appeared Status: Date Acquired: 01/16/2017 Comorbid Cataracts, Hypertension, Peripheral Weeks Of Treatment: 1 History: Venous Disease, Received Clustered Wound: No Chemotherapy,  Received Radiation Photos Photo Uploaded  By: Alric Quan on 02/06/2017 11:56:29 Wound Measurements Length: (cm) 0 % Reductio Width: (cm) 0 % Reductio Depth: (cm) 0 Epithelial Area: (cm) 0 Tunneling Volume: (cm) 0 Undermini n in Area: 100% n in Volume: 100% ization: None : No ng: No Wound Description Classification: Partial Thickness Wound Margin: Distinct, outline attached Exudate Amount: None Present Foul Odor After Cleansing: No Slough/Fibrino No Wound Bed Granulation Amount: None Present (0%) Exposed Structure Necrotic Amount: None Present (0%) Fascia Exposed: No Fat Layer (Subcutaneous Tissue) Exposed: No Tendon Exposed: No Muscle Exposed: No Joint Exposed: No Mordecai, Nicholis T. ([id number]) Bone Exposed: No Limited to Skin Breakdown Periwound Skin Texture Texture Color No Abnormalities Noted: No No Abnormalities Noted: No Moisture Temperature / Pain No Abnormalities Noted: No Temperature: No Abnormality Dry / Scaly: Yes Tenderness on Palpation: Yes Wound Preparation Ulcer Cleansing: Rinsed/Irrigated with Saline Topical Anesthetic Applied: None Electronic Signature(s) Signed: 02/06/2017 12:02:17 PM By: Alric Quan Entered By: Alric Quan on 02/06/2017 11:12:06 [name] ([id number]) -------------------------------------------------------------------------------- Wound Assessment Details Patient Name: [name] Date of Service: 02/06/2017 11:00 AM Medical Record Number: [id number] Patient Account Number: [id number] Date of Birth/Sex: 17-Jul-1919 (83 y.o. Male) Treating RN: Ahmed Prima Primary Care Karell Tukes: Jenna Luo Other Clinician: Referring Gregery Walberg: Jenna Luo Treating Ivyanna Sibert/Extender: Frann Rider in Treatment: 1 Wound Status Wound Number: 2 Primary Atypical Etiology: Wound Location: Right Toe - Web between 1st and 2nd Wound Healed - Epithelialized Status: Wounding Event: Gradually Appeared Comorbid Cataracts, Hypertension, Peripheral Date  Acquired: 01/16/2017 History: Venous Disease, Received Weeks Of Treatment: 1 Chemotherapy, Received Radiation Clustered Wound: No Photos Photo Uploaded By: Alric Quan on 02/06/2017 11:56:29 Wound Measurements Length: (cm) 0 % Reductio Width: (cm) 0 % Reductio Depth: (cm) 0 Epithelial Area: (cm) 0 Tunneling Volume: (cm) 0 Undermini n in Area: 100% n in Volume: 100% ization: Large (67-100%) : No ng: No Wound Description Classification: Partial Thickness Wound Margin: Distinct, outline attached Exudate Amount: None Present Foul Odor After Cleansing: No Slough/Fibrino No Wound Bed Granulation Amount: None Present (0%) Exposed Structure Necrotic Amount: None Present (0%) Fascia Exposed: No Fat Layer (Subcutaneous Tissue) Exposed: No Tendon Exposed: No Muscle Exposed: No Kinker, Erique T. ([id number]) Joint Exposed: No Bone Exposed: No Limited to Skin Breakdown Periwound Skin Texture Texture Color No Abnormalities Noted: No No Abnormalities Noted: No Moisture Temperature / Pain No Abnormalities Noted: No Temperature: No Abnormality Dry / Scaly: Yes Tenderness on Palpation: Yes Wound Preparation Ulcer Cleansing: Rinsed/Irrigated with Saline Topical Anesthetic Applied: None Electronic Signature(s) Signed: 02/06/2017 12:02:17 PM By: Alric Quan Entered By: Alric Quan on 02/06/2017 11:12:17 [name] ([id number]) -------------------------------------------------------------------------------- Vitals Details Patient Name: [name] Date of Service: 02/06/2017 11:00 AM Medical Record Number: [id number] Patient Account Number: [id number] Date of Birth/Sex: [birth year] (83 y.o. Male) Treating RN: Ahmed Prima Primary Care Christobal Morado: Jenna Luo Other Clinician: Referring Gini Caputo: Jenna Luo Treating Curran Lenderman/Extender: Frann Rider in Treatment: 1 Vital Signs Time Taken: 10:57 Temperature (F): 97.5 Height (in): 73 Pulse  (bpm): 68 Weight (lbs): 205 Respiratory Rate (breaths/min): 16 Body Mass Index (BMI): 27 Blood Pressure (mmHg): 158/86 Reference Range: 80 - 120 mg / dl Electronic Signature(s) Signed: 02/06/2017 12:02:17 PM By: Alric Quan Entered By: Alric Quan on 02/06/2017 10:58:40

## 2017-02-06 NOTE — Progress Notes (Signed)
YUREM, VINER ([id number]) Visit Report for 02/06/2017 Chief Complaint Document Details Patient Name: [name], [name] Date of Service: 02/06/2017 11:00 AM Medical Record Number: [id number] Patient Account Number: [id number] Date of Birth/Sex: July 08, 1919 (83 y.o. Male) Treating RN: Ahmed Prima Primary Care Provider: Jenna Luo Other Clinician: Referring Provider: Jenna Luo Treating Provider/Extender: Frann Rider in Treatment: 1 Information Obtained from: Patient Chief Complaint Patient seen for complaints of Non-Healing Wound to both feet left worse than right for about 2 weeks Electronic Signature(s) Signed: 02/06/2017 11:24:40 AM By: Christin Fudge MD, FACS Entered By: Christin Fudge on 02/06/2017 11:24:40 [name] ([id number]) -------------------------------------------------------------------------------- HPI Details Patient Name: [name] Date of Service: 02/06/2017 11:00 AM Medical Record Number: [id number] Patient Account Number: [id number] Date of Birth/Sex: 1919/09/28 (83 y.o. Male) Treating RN: Ahmed Prima Primary Care Provider: Jenna Luo Other Clinician: Referring Provider: Jenna Luo Treating Provider/Extender: Frann Rider in Treatment: 1 History of Present Illness Location: left and right dorsum foot and in between his toes Quality: Patient reports No Pain. Severity: Patient states wound are getting worse. Duration: Patient has had the wound for < 2 weeks prior to presenting for treatment Timing: Pain in wound is Intermittent (comes and goes Context: The wound appeared gradually over time Modifying Factors: Other treatment(s) tried include:admitted to the hospital and had a arterial workup and has been on oral antibiotics Associated Signs and Symptoms: Patient reports having increase discharge. HPI Description: 83 year old gentleman comes from [facility] with a history of having recently been admitted to  the hospital between February 20 and 01/20/2017, and he was admitted with cellulitis and peripheral vascular disease with possible status dermatitis and an open toe wound. he is got a medical history significant of peripheral vascular disease, hypertension, non-Hodgkin's lymphoma in remission and amputation of the left great toe. status post amputation of the left big toe in July 2016 and biopsy of the left neck mass in October 2014. he does not smoke at present and given In the 80s. I understand in the past he's had a venous duplex study done and was treated for symptomatic varicose veins with compression stockings. Patient was treated in hospital with Rocephin and Flagyl and was discharged home on Keflex and Diflucan for 7 days. He had a ABI which showed reduced light flow in the left lower extremity and vascular surgery recommended an outpatient angiogram. right ABI was 0.94 with a monophasic flow in the left ABI was 0.67 with a monophasic flow. was seen by the vascular surgeon Dr. Gwenlyn Saran who noted wounds of both left and right toes with depressed ABIs bilaterally and objective evidence of chronic venous disease. x-ray of the left foot -- IMPRESSION:1. Diffuse soft tissue swelling and osteopenia. No obvious bony destructive process or periosteal reaction. 2. Patient is status post amputation of the tuft of the first distal phalanx. Electronic Signature(s) Signed: 02/06/2017 11:24:45 AM By: Christin Fudge MD, FACS Entered By: Christin Fudge on 02/06/2017 11:24:45 [name] ([id number]) -------------------------------------------------------------------------------- Physical Exam Details Patient Name: [name] Date of Service: 02/06/2017 11:00 AM Medical Record Number: [id number] Patient Account Number: [id number] Date of Birth/Sex: February 20, 1919 (83 y.o. Male) Treating RN: Ahmed Prima Primary Care Provider: Jenna Luo Other Clinician: Referring Provider: Jenna Luo Treating Provider/Extender: Frann Rider in Treatment: 1 Constitutional . Pulse regular. Respirations normal and unlabored. Afebrile. . Eyes Nonicteric. Reactive to light. Ears, Nose, Mouth, and Throat Lips, teeth, and gums WNL.Marland Kitchen Moist mucosa without lesions. Neck supple and nontender. No  palpable supraclavicular or cervical adenopathy. Normal sized without goiter. Respiratory WNL. No retractions.. Breath sounds WNL, No rubs, rales, rhonchi, or wheeze.. Cardiovascular Heart rhythm and rate regular, no murmur or gallop.. Pedal Pulses WNL. No clubbing, cyanosis or edema. Lymphatic No adneopathy. No adenopathy. No adenopathy. Musculoskeletal Adexa without tenderness or enlargement.. Digits and nails w/o clubbing, cyanosis, infection, petechiae, ischemia, or inflammatory conditions.. Integumentary (Hair, Skin) No suspicious lesions. No crepitus or fluctuance. No peri-wound warmth or erythema. No masses.Marland Kitchen Psychiatric Judgement and insight Intact.. No evidence of depression, anxiety, or agitation.. Notes the patient's left foot looks excellent with no evidence of open ulceration and the fungal infection has gotten much better and the linear fissures have healed. The right forefoot also looks very good. Electronic Signature(s) Signed: 02/06/2017 11:25:20 AM By: Christin Fudge MD, FACS Entered By: Christin Fudge on 02/06/2017 11:25:20 [name] ([id number]) -------------------------------------------------------------------------------- Physician Orders Details Patient Name: [name] Date of Service: 02/06/2017 11:00 AM Medical Record Number: [id number] Patient Account Number: [id number] Date of Birth/Sex: 1919/10/04 (83 y.o. Male) Treating RN: Ahmed Prima Primary Care Provider: Jenna Luo Other Clinician: Referring Provider: Jenna Luo Treating Provider/Extender: Frann Rider in Treatment: 1 Verbal / Phone Orders: Yes Clinician: Carolyne Fiscal,  Debi Read Back and Verified: Yes Diagnosis [place] only NURSING Discharge From [facility] Services o Discharge from [place] - Please call our office if you have any questions or concerns. Electronic Signature(s) Signed: 02/06/2017 12:02:17 PM By: Alric Quan Signed: 02/06/2017 2:21:07 PM By: Christin Fudge MD, FACS Entered By: Alric Quan on 02/06/2017 11:13:55 [name] ([id number]) -------------------------------------------------------------------------------- Problem List Details Patient Name: [name] Date of Service: 02/06/2017 11:00 AM Medical Record Number: [id number] Patient Account Number: [id number] Date of Birth/Sex: 03/19/1919 (83 y.o. Male) Treating RN: Ahmed Prima Primary Care Provider: Jenna Luo Other Clinician: Referring Provider: Jenna Luo Treating Provider/Extender: Frann Rider in Treatment: 1 Active Problems ICD-10 Encounter Code Description Active Date Diagnosis I70.235 Atherosclerosis of native arteries of right leg with 01/30/2017 Yes ulceration of other part of foot I70.245 Atherosclerosis of native arteries of left leg with ulceration 01/30/2017 Yes of other part of foot I87.2 Venous insufficiency (chronic) (peripheral) 01/30/2017 Yes B36.9 Superficial mycosis, unspecified 01/30/2017 Yes Inactive Problems Resolved Problems Electronic Signature(s) Signed: 02/06/2017 11:24:30 AM By: Christin Fudge MD, FACS Entered By: Christin Fudge on 02/06/2017 11:24:30 [name] ([id number]) -------------------------------------------------------------------------------- Progress Note Details Patient Name: [name] Date of Service: 02/06/2017 11:00 AM Medical Record Number: [id number] Patient Account Number: [id number] Date of Birth/Sex: 1919/02/09 (83 y.o. Male) Treating RN: Ahmed Prima Primary Care Provider: Jenna Luo Other Clinician: Referring  Provider: Jenna Luo Treating Provider/Extender: Frann Rider in Treatment: 1 Subjective Chief Complaint Information obtained from Patient Patient seen for complaints of Non-Healing Wound to both feet left worse than right for about 2 weeks History of Present Illness (HPI) The following HPI elements were documented for the patient's wound: Location: left and right dorsum foot and in between his toes Quality: Patient reports No Pain. Severity: Patient states wound are getting worse. Duration: Patient has had the wound for < 2 weeks prior to presenting for treatment Timing: Pain in wound is Intermittent (comes and goes Context: The wound appeared gradually over time Modifying Factors: Other treatment(s) tried include:admitted to the hospital and had a arterial workup and has been on oral antibiotics Associated Signs and Symptoms: Patient reports having increase discharge. 83 year old gentleman comes from [facility]  with a history of having recently been admitted to the hospital between February 20 and 01/20/2017, and he was admitted with cellulitis and peripheral vascular disease with possible status dermatitis and an open toe wound. he is got a medical history significant of peripheral vascular disease, hypertension, non-Hodgkin's lymphoma in remission and amputation of the left great toe. status post amputation of the left big toe in July 2016 and biopsy of the left neck mass in October 2014. he does not smoke at present and given In the 80s. I understand in the past he's had a venous duplex study done and was treated for symptomatic varicose veins with compression stockings. Patient was treated in hospital with Rocephin and Flagyl and was discharged home on Keflex and Diflucan for 7 days. He had a ABI which showed reduced light flow in the left lower extremity and vascular surgery recommended an outpatient angiogram. right ABI was 0.94 with a monophasic flow in the  left ABI was 0.67 with a monophasic flow. was seen by the vascular surgeon Dr. Gwenlyn Saran who noted wounds of both left and right toes with depressed ABIs bilaterally and objective evidence of chronic venous disease. x-ray of the left foot -- IMPRESSION:1. Diffuse soft tissue swelling and osteopenia. No obvious bony destructive process or periosteal reaction. 2. Patient is status post amputation of the tuft of the first distal phalanx. [place], Rayland T. ([id number]) Objective Constitutional Pulse regular. Respirations normal and unlabored. Afebrile. Vitals Time Taken: 10:57 AM, Height: 73 in, Weight: 205 lbs, BMI: 27, Temperature: 97.5 F, Pulse: 68 bpm, Respiratory Rate: 16 breaths/min, Blood Pressure: 158/86 mmHg. Eyes Nonicteric. Reactive to light. Ears, Nose, Mouth, and Throat Lips, teeth, and gums WNL.Marland Kitchen Moist mucosa without lesions. Neck supple and nontender. No palpable supraclavicular or cervical adenopathy. Normal sized without goiter. Respiratory WNL. No retractions.. Breath sounds WNL, No rubs, rales, rhonchi, or wheeze.. Cardiovascular Heart rhythm and rate regular, no murmur or gallop.. Pedal Pulses WNL. No clubbing, cyanosis or edema. Lymphatic No adneopathy. No adenopathy. No adenopathy. Musculoskeletal Adexa without tenderness or enlargement.. Digits and nails w/o clubbing, cyanosis, infection, petechiae, ischemia, or inflammatory conditions.Marland Kitchen Psychiatric Judgement and insight Intact.. No evidence of depression, anxiety, or agitation.. General Notes: the patient's left foot looks excellent with no evidence of open ulceration and the fungal infection has gotten much better and the linear fissures have healed. The right forefoot also looks very good. Integumentary (Hair, Skin) No suspicious lesions. No crepitus or fluctuance. No peri-wound warmth or erythema. No masses.. Wound #1 status is Healed - Epithelialized. Original cause of wound was Gradually Appeared. The wound is  located on the Left,Distal Foot. The wound measures 0cm length x 0cm width x 0cm depth; 0cm^2 area and 0cm^3 volume. The wound is limited to skin breakdown. There is no tunneling or undermining noted. There is a none present amount of drainage noted. The wound margin is distinct with the outline attached to the wound base. There is no granulation within the wound bed. There is no necrotic tissue within the wound bed. The periwound skin appearance exhibited: Dry/Scaly. Periwound temperature was noted as No Abnormality. The periwound has tenderness on palpation. [place], Pascual T. ([id number]) Wound #2 status is Healed - Epithelialized. Original cause of wound was Gradually Appeared. The wound is located on the Right Toe - Web between 1st and 2nd. The wound measures 0cm length x 0cm width x 0cm depth; 0cm^2 area and 0cm^3 volume. The wound is limited to skin breakdown. There is no tunneling or undermining  noted. There is a none present amount of drainage noted. The wound margin is distinct with the outline attached to the wound base. There is no granulation within the wound bed. There is no necrotic tissue within the wound bed. The periwound skin appearance exhibited: Dry/Scaly. Periwound temperature was noted as No Abnormality. The periwound has tenderness on palpation. Assessment Active Problems ICD-10 I70.235 - Atherosclerosis of native arteries of right leg with ulceration of other part of foot I70.245 - Atherosclerosis of native arteries of left leg with ulceration of other part of foot I87.2 - Venous insufficiency (chronic) (peripheral) B36.9 - Superficial mycosis, unspecified Plan Home Health: D/C [place] only NURSING Discharge From [facility] Services: Discharge from [place] - Please call our office if you have any questions or concerns. I have recommended: 1. Lotrisone cream to be applied over the areas in question and a silver alginate based dressing  material to remove moisture 2. Follow-up with dermatology, vascular surgery and his PCP 3. his wounds have healed and I have discharged him from the wound care services and will be seen back only if needed Patient's son was at the bedside has had all questions answered. ADMIRAL, MARCUCCI ([id number]) Electronic Signature(s) Signed: 02/06/2017 11:26:32 AM By: Christin Fudge MD, FACS Entered By: Christin Fudge on 02/06/2017 11:26:32 [name] ([id number]) -------------------------------------------------------------------------------- SuperBill Details Patient Name: [name] Date of Service: 02/06/2017 Medical Record Number: [id number] Patient Account Number: [id number] Date of Birth/Sex: 19-Oct-1919 (83 y.o. Male) Treating RN: Ahmed Prima Primary Care Provider: Jenna Luo Other Clinician: Referring Provider: Jenna Luo Treating Provider/Extender: Christin Fudge Service Line: Outpatient Weeks in Treatment: 1 Diagnosis Coding ICD-10 Codes Code Description [phone] Atherosclerosis of native arteries of right leg with ulceration of other part of foot I70.245 Atherosclerosis of native arteries of left leg with ulceration of other part of foot I87.2 Venous insufficiency (chronic) (peripheral) B36.9 Superficial mycosis, unspecified Facility Procedures CPT4 Code: [id number] Description: 32992 - WOUND CARE VISIT-LEV 2 EST PT Modifier: Quantity: 1 Physician Procedures CPT4: Description Modifier Quantity Code [id number] - WC PHYS LEVEL 2 - EST PT 1 ICD-10 Description Diagnosis I70.235 Atherosclerosis of native arteries of right leg with ulceration of other part of foot I70.245 Atherosclerosis of native arteries of  left leg with ulceration of other part of foot I87.2 Venous insufficiency (chronic) (peripheral) B36.9 Superficial mycosis, unspecified Electronic Signature(s) Signed: 02/06/2017 12:02:17 PM By: Alric Quan Signed: 02/06/2017 2:21:07 PM By: Christin Fudge MD,  FACS Previous Signature: 02/06/2017 11:26:50 AM Version By: Christin Fudge MD, FACS Entered By: Alric Quan on 02/06/2017 11:54:50

## 2017-02-07 ENCOUNTER — Ambulatory Visit: Payer: Medicare Other | Admitting: Podiatry

## 2017-02-07 DIAGNOSIS — L97521 Non-pressure chronic ulcer of other part of left foot limited to breakdown of skin: Secondary | ICD-10-CM | POA: Diagnosis not present

## 2017-02-07 DIAGNOSIS — Z87891 Personal history of nicotine dependence: Secondary | ICD-10-CM | POA: Diagnosis not present

## 2017-02-07 DIAGNOSIS — I872 Venous insufficiency (chronic) (peripheral): Secondary | ICD-10-CM | POA: Diagnosis not present

## 2017-02-07 DIAGNOSIS — Z89412 Acquired absence of left great toe: Secondary | ICD-10-CM | POA: Diagnosis not present

## 2017-02-07 DIAGNOSIS — Z8572 Personal history of non-Hodgkin lymphomas: Secondary | ICD-10-CM | POA: Diagnosis not present

## 2017-02-07 DIAGNOSIS — I10 Essential (primary) hypertension: Secondary | ICD-10-CM | POA: Diagnosis not present

## 2017-02-07 DIAGNOSIS — I739 Peripheral vascular disease, unspecified: Secondary | ICD-10-CM | POA: Diagnosis not present

## 2017-02-07 DIAGNOSIS — L03116 Cellulitis of left lower limb: Secondary | ICD-10-CM | POA: Diagnosis not present

## 2017-02-07 DIAGNOSIS — Z7982 Long term (current) use of aspirin: Secondary | ICD-10-CM | POA: Diagnosis not present

## 2017-02-07 DIAGNOSIS — Z792 Long term (current) use of antibiotics: Secondary | ICD-10-CM | POA: Diagnosis not present

## 2017-02-08 DIAGNOSIS — I872 Venous insufficiency (chronic) (peripheral): Secondary | ICD-10-CM | POA: Diagnosis not present

## 2017-02-08 DIAGNOSIS — Z7982 Long term (current) use of aspirin: Secondary | ICD-10-CM | POA: Diagnosis not present

## 2017-02-08 DIAGNOSIS — I8311 Varicose veins of right lower extremity with inflammation: Secondary | ICD-10-CM | POA: Diagnosis not present

## 2017-02-08 DIAGNOSIS — L03116 Cellulitis of left lower limb: Secondary | ICD-10-CM | POA: Diagnosis not present

## 2017-02-08 DIAGNOSIS — L97521 Non-pressure chronic ulcer of other part of left foot limited to breakdown of skin: Secondary | ICD-10-CM | POA: Diagnosis not present

## 2017-02-08 DIAGNOSIS — Z792 Long term (current) use of antibiotics: Secondary | ICD-10-CM | POA: Diagnosis not present

## 2017-02-08 DIAGNOSIS — I739 Peripheral vascular disease, unspecified: Secondary | ICD-10-CM | POA: Diagnosis not present

## 2017-02-08 DIAGNOSIS — Z89412 Acquired absence of left great toe: Secondary | ICD-10-CM | POA: Diagnosis not present

## 2017-02-08 DIAGNOSIS — I8312 Varicose veins of left lower extremity with inflammation: Secondary | ICD-10-CM | POA: Diagnosis not present

## 2017-02-08 DIAGNOSIS — B351 Tinea unguium: Secondary | ICD-10-CM | POA: Diagnosis not present

## 2017-02-08 DIAGNOSIS — Z85828 Personal history of other malignant neoplasm of skin: Secondary | ICD-10-CM | POA: Diagnosis not present

## 2017-02-08 DIAGNOSIS — I10 Essential (primary) hypertension: Secondary | ICD-10-CM | POA: Diagnosis not present

## 2017-02-08 DIAGNOSIS — Z87891 Personal history of nicotine dependence: Secondary | ICD-10-CM | POA: Diagnosis not present

## 2017-02-08 DIAGNOSIS — B353 Tinea pedis: Secondary | ICD-10-CM | POA: Diagnosis not present

## 2017-02-08 DIAGNOSIS — Z8572 Personal history of non-Hodgkin lymphomas: Secondary | ICD-10-CM | POA: Diagnosis not present

## 2017-02-09 DIAGNOSIS — Z8572 Personal history of non-Hodgkin lymphomas: Secondary | ICD-10-CM | POA: Diagnosis not present

## 2017-02-09 DIAGNOSIS — Z7982 Long term (current) use of aspirin: Secondary | ICD-10-CM | POA: Diagnosis not present

## 2017-02-09 DIAGNOSIS — I10 Essential (primary) hypertension: Secondary | ICD-10-CM | POA: Diagnosis not present

## 2017-02-09 DIAGNOSIS — I739 Peripheral vascular disease, unspecified: Secondary | ICD-10-CM | POA: Diagnosis not present

## 2017-02-09 DIAGNOSIS — L03116 Cellulitis of left lower limb: Secondary | ICD-10-CM | POA: Diagnosis not present

## 2017-02-09 DIAGNOSIS — I872 Venous insufficiency (chronic) (peripheral): Secondary | ICD-10-CM | POA: Diagnosis not present

## 2017-02-09 DIAGNOSIS — Z89412 Acquired absence of left great toe: Secondary | ICD-10-CM | POA: Diagnosis not present

## 2017-02-09 DIAGNOSIS — L97521 Non-pressure chronic ulcer of other part of left foot limited to breakdown of skin: Secondary | ICD-10-CM | POA: Diagnosis not present

## 2017-02-09 DIAGNOSIS — Z792 Long term (current) use of antibiotics: Secondary | ICD-10-CM | POA: Diagnosis not present

## 2017-02-09 DIAGNOSIS — Z87891 Personal history of nicotine dependence: Secondary | ICD-10-CM | POA: Diagnosis not present

## 2017-02-10 ENCOUNTER — Encounter: Payer: Self-pay | Admitting: Vascular Surgery

## 2017-02-10 ENCOUNTER — Ambulatory Visit (INDEPENDENT_AMBULATORY_CARE_PROVIDER_SITE_OTHER): Payer: Medicare Other | Admitting: Vascular Surgery

## 2017-02-10 VITALS — BP 152/83 | HR 64 | Temp 97.1°F | Resp 18 | Ht 73.0 in | Wt 204.0 lb

## 2017-02-10 DIAGNOSIS — I739 Peripheral vascular disease, unspecified: Secondary | ICD-10-CM | POA: Diagnosis not present

## 2017-02-10 NOTE — Progress Notes (Signed)
Patient ID: [name], male   DOB: [birth year], 83 y.o.   MRN: [id number]  Reason for Consult: Routine Post Op   Referred by Susy Frizzle, MD  Subjective:     HPI:  [name] is a 83 y.o. male presents for follow-up of his presumed arterial and venous disease of his bilateral lower extremities. He was recently hospitalized with cellulitis mostly of his left foot with associated edema. Since his hospitalization he has been seen in the Wound Ctr.,  and he does not have further cellulitis and is using a cream on his bilateral lower extremities. He does have swelling of his left lower extremity and he is on his feet but he has been remaining recumbent mostly. He does not like compression stockings and has not been wearing them. Since the time of hospitalization he has been eating very well states that he is gaining weight. He denies any fevers or chills or other constitutional symptoms. He has no complaints today regarding this visit.  Past Medical History:  Diagnosis Date  . Acid reflux   . Cancer ([place]) 09/05/13   left neck-non-hodgkins lymphoma  . Hypertension    requires diuretic  . Hypothyroidism   . Malaria    while in the service  . Peripheral vascular disease ([place])    slow wound healing on legs   No family history on file. Past Surgical History:  Procedure Laterality Date  . AMPUTATION Left 06/25/2015   Procedure: LEFT  HALLUX AMPUTATION,;  Surgeon: Wylene Simmer, MD;  Location: [facility];  Service: Orthopedics;  Laterality: Left;  LOCAL/MAC  . MASS BIOPSY Left 09/05/2013   Procedure: EXCISIONAL BIOPSY OF LEFT NECK MASS;  Surgeon: Jerrell Belfast, MD;  Location: [facility];  Service: ENT;  Laterality: Left;  . TONSILLECTOMY    . YAG LASER APPLICATION Right 3/70/4888   Procedure: YAG LASER APPLICATION;  Surgeon: [name], MD;  Location: [facility];  Service: Ophthalmology;  Laterality: Right;    Short Social History:  Social History  Substance Use  Topics  . Smoking status: Former Smoker    Types: Cigars  . Smokeless tobacco: Former Systems developer    Quit date: 10/05/1979  . Alcohol use No    Allergies  Allergen Reactions  . Codeine Nausea And Vomiting  . Hydrocodone Nausea And Vomiting  . Percocet [Oxycodone-Acetaminophen] Nausea And Vomiting    Current Outpatient Prescriptions  Medication Sig Dispense Refill  . aspirin EC 81 MG tablet Take 1 tablet (81 mg total) by mouth daily.    Marland Kitchen atorvastatin (LIPITOR) 10 MG tablet Take 1 tablet (10 mg total) by mouth daily. 30 tablet 2  . enalapril (VASOTEC) 20 MG tablet TAKE 1 TABLET BY MOUTH  EVERY DAY 90 tablet 1  . fluconazole (DIFLUCAN) 100 MG tablet Take 1 tablet (100 mg total) by mouth daily. 7 tablet 0  . furosemide (LASIX) 20 MG tablet TAKE 1 TABLET BY MOUTH  EVERY DAY 90 tablet 1  . gabapentin (NEURONTIN) 100 MG capsule Take 2 capsules every morning 180 capsule 4  . gabapentin (NEURONTIN) 300 MG capsule Take 1 capsule (300 mg total) by mouth at bedtime. 90 capsule 4  . levothyroxine (SYNTHROID, LEVOTHROID) 88 MCG tablet TAKE 1 TABLET BY MOUTH  EVERY DAY 90 tablet 1  . mometasone (ELOCON) 0.1 % ointment APPLY TO AFFECTED AREA EVERY DAY  0  . NONFORMULARY OR COMPOUNDED ITEM [facility]:  Peripheral Neuropathy cream - Bupivacaine 1%, Doxepin 3%, Gabapentin 6%,  Pentoxifylline 3%, Topiramate 1%, apply 1-2 grams to affected area 3-4 times daily. 120 each 11  . traMADol (ULTRAM) 50 MG tablet TAKE 1 TABLET BY MOUTH EVERY 8 HOURS AS NEEDED FOR PAIN 30 tablet 0  . triamcinolone cream (KENALOG) 0.1 % APPLY TO AFFECTED AREA TWICE A DAY FOR 2-4 WEEKS AS NEEDED FOR FLARES  1  . vitamin B-12 (CYANOCOBALAMIN) 1000 MCG tablet Take 1 tablet (1,000 mcg total) by mouth daily. 30 tablet 5  . cephALEXin (KEFLEX) 250 MG capsule Take 250 mg by mouth 2 (two) times daily.  0  . cephALEXin (KEFLEX) 500 MG capsule Take 1 capsule (500 mg total) by mouth 2 (two) times daily. (Patient not taking: Reported on  02/10/2017) 14 capsule 0  . clotrimazole-betamethasone (LOTRISONE) cream APPLY AS DIRECTED BY DR  2   No current facility-administered medications for this visit.     Review of Systems  Constitutional:  Constitutional negative. HENT: HENT negative.  Eyes: Eyes negative.  Respiratory: Respiratory negative.  Cardiovascular: Cardiovascular negative.  GI: Gastrointestinal negative.  Musculoskeletal: Musculoskeletal negative.  Skin: Skin negative.  Neurological:       Balance issues Hematologic: Hematologic/lymphatic negative.  Psychiatric: Psychiatric negative.        Objective:  Objective   Vitals:   02/10/17 1532 02/10/17 1538  BP: (!) 169/84 (!) 152/83  Pulse: 64 64  Resp: 18   Temp: 97.1 F (36.2 C)   SpO2: 100%   Weight: 204 lb (92.5 kg)   Height: _0  (1.854 m)    Body mass index is 26.91 kg/m.  Physical Exam  Constitutional: He is oriented to person, place, and time. He appears well-developed.  HENT:  Head: Normocephalic.  Cardiovascular: Normal rate.   Pulses:      Popliteal pulses are 2+ on the right side, and 2+ on the left side.  Signals at dp/pt/peroneal  Pulmonary/Chest: Effort normal.  Abdominal: Soft. He exhibits no mass.  Musculoskeletal: Normal range of motion. He exhibits no edema.  Well healed amputation of left great toe  Lymphadenopathy:    He has no cervical adenopathy.  Neurological: He is alert and oriented to person, place, and time.  Skin:  Changes consistent with  c4 venous disease, no ulceration or cellulitis present  Psychiatric: He has a normal mood and affect. His behavior is normal. Judgment and thought content normal.    Data: I again reviewed his ABIs from the hospital with demonstrate normal right side at rest moderate reduction on the left side.     Assessment/Plan:     83 year old male presents for follow-up of cellulitis of his left lower extremity. He does have some persistent edema although not on exam today. He is  evaluated in the wound center and is doing very well from that standpoint not on antibiotics. I recommended compression stockings but he does not like these is unlikely to wear them. He does have good signals in his bilateral ankles at the [facility] PT and peroneal arteries and as such I cannot recommend any arterial intervention at this time. Should he have further issues he may need evaluation of his venous system. At this time in the presence of his daughter we will not pursue any further interventions. They're happy with this and can follow-up on an as-needed basis.  I spent 15 minutes with this patient greater than 50% of which was spent counseling and coordination of care.     Waynetta Sandy MD [facility]

## 2017-02-10 NOTE — Progress Notes (Signed)
Vitals:   02/10/17 1532  BP: (!) 169/84  Pulse: 64  Resp: 18  Temp: 97.1 F (36.2 C)  SpO2: 100%  Weight: 204 lb (92.5 kg)  Height: 6\' 1"  (1.854 m)

## 2017-02-13 DIAGNOSIS — I739 Peripheral vascular disease, unspecified: Secondary | ICD-10-CM | POA: Diagnosis not present

## 2017-02-13 DIAGNOSIS — Z87891 Personal history of nicotine dependence: Secondary | ICD-10-CM | POA: Diagnosis not present

## 2017-02-13 DIAGNOSIS — Z792 Long term (current) use of antibiotics: Secondary | ICD-10-CM | POA: Diagnosis not present

## 2017-02-13 DIAGNOSIS — L03116 Cellulitis of left lower limb: Secondary | ICD-10-CM | POA: Diagnosis not present

## 2017-02-13 DIAGNOSIS — Z89412 Acquired absence of left great toe: Secondary | ICD-10-CM | POA: Diagnosis not present

## 2017-02-13 DIAGNOSIS — I872 Venous insufficiency (chronic) (peripheral): Secondary | ICD-10-CM | POA: Diagnosis not present

## 2017-02-13 DIAGNOSIS — I10 Essential (primary) hypertension: Secondary | ICD-10-CM | POA: Diagnosis not present

## 2017-02-13 DIAGNOSIS — Z8572 Personal history of non-Hodgkin lymphomas: Secondary | ICD-10-CM | POA: Diagnosis not present

## 2017-02-13 DIAGNOSIS — Z7982 Long term (current) use of aspirin: Secondary | ICD-10-CM | POA: Diagnosis not present

## 2017-02-13 DIAGNOSIS — L97521 Non-pressure chronic ulcer of other part of left foot limited to breakdown of skin: Secondary | ICD-10-CM | POA: Diagnosis not present

## 2017-02-14 DIAGNOSIS — Z89412 Acquired absence of left great toe: Secondary | ICD-10-CM | POA: Diagnosis not present

## 2017-02-14 DIAGNOSIS — I739 Peripheral vascular disease, unspecified: Secondary | ICD-10-CM | POA: Diagnosis not present

## 2017-02-14 DIAGNOSIS — L97521 Non-pressure chronic ulcer of other part of left foot limited to breakdown of skin: Secondary | ICD-10-CM | POA: Diagnosis not present

## 2017-02-14 DIAGNOSIS — I872 Venous insufficiency (chronic) (peripheral): Secondary | ICD-10-CM | POA: Diagnosis not present

## 2017-02-14 DIAGNOSIS — Z792 Long term (current) use of antibiotics: Secondary | ICD-10-CM | POA: Diagnosis not present

## 2017-02-14 DIAGNOSIS — Z87891 Personal history of nicotine dependence: Secondary | ICD-10-CM | POA: Diagnosis not present

## 2017-02-14 DIAGNOSIS — Z7982 Long term (current) use of aspirin: Secondary | ICD-10-CM | POA: Diagnosis not present

## 2017-02-14 DIAGNOSIS — L03116 Cellulitis of left lower limb: Secondary | ICD-10-CM | POA: Diagnosis not present

## 2017-02-14 DIAGNOSIS — Z8572 Personal history of non-Hodgkin lymphomas: Secondary | ICD-10-CM | POA: Diagnosis not present

## 2017-02-14 DIAGNOSIS — I10 Essential (primary) hypertension: Secondary | ICD-10-CM | POA: Diagnosis not present

## 2017-02-16 DIAGNOSIS — I872 Venous insufficiency (chronic) (peripheral): Secondary | ICD-10-CM | POA: Diagnosis not present

## 2017-02-16 DIAGNOSIS — L97521 Non-pressure chronic ulcer of other part of left foot limited to breakdown of skin: Secondary | ICD-10-CM | POA: Diagnosis not present

## 2017-02-16 DIAGNOSIS — I739 Peripheral vascular disease, unspecified: Secondary | ICD-10-CM | POA: Diagnosis not present

## 2017-02-16 DIAGNOSIS — Z7982 Long term (current) use of aspirin: Secondary | ICD-10-CM | POA: Diagnosis not present

## 2017-02-16 DIAGNOSIS — Z8572 Personal history of non-Hodgkin lymphomas: Secondary | ICD-10-CM | POA: Diagnosis not present

## 2017-02-16 DIAGNOSIS — Z87891 Personal history of nicotine dependence: Secondary | ICD-10-CM | POA: Diagnosis not present

## 2017-02-16 DIAGNOSIS — I10 Essential (primary) hypertension: Secondary | ICD-10-CM | POA: Diagnosis not present

## 2017-02-16 DIAGNOSIS — Z792 Long term (current) use of antibiotics: Secondary | ICD-10-CM | POA: Diagnosis not present

## 2017-02-16 DIAGNOSIS — L03116 Cellulitis of left lower limb: Secondary | ICD-10-CM | POA: Diagnosis not present

## 2017-02-16 DIAGNOSIS — Z89412 Acquired absence of left great toe: Secondary | ICD-10-CM | POA: Diagnosis not present

## 2017-03-03 ENCOUNTER — Ambulatory Visit (INDEPENDENT_AMBULATORY_CARE_PROVIDER_SITE_OTHER): Payer: Medicare Other | Admitting: Family Medicine

## 2017-03-03 ENCOUNTER — Encounter: Payer: Self-pay | Admitting: Family Medicine

## 2017-03-03 VITALS — BP 132/86 | HR 68 | Temp 97.9°F | Resp 18

## 2017-03-03 DIAGNOSIS — R509 Fever, unspecified: Secondary | ICD-10-CM

## 2017-03-03 DIAGNOSIS — J189 Pneumonia, unspecified organism: Secondary | ICD-10-CM

## 2017-03-03 LAB — URINALYSIS, ROUTINE W REFLEX MICROSCOPIC
Bilirubin Urine: NEGATIVE
Glucose, UA: NEGATIVE
Hgb urine dipstick: NEGATIVE
Ketones, ur: NEGATIVE
Nitrite: NEGATIVE
Protein, ur: NEGATIVE
Specific Gravity, Urine: 1.02 (ref 1.001–1.035)
pH: 5.5 (ref 5.0–8.0)

## 2017-03-03 LAB — CBC
HCT: 42.2 % (ref 38.5–50.0)
Hemoglobin: 14.2 g/dL (ref 13.0–17.0)
MCH: 32 pg (ref 27.0–33.0)
MCHC: 33.6 g/dL (ref 32.0–36.0)
MCV: 95 fL (ref 80.0–100.0)
Platelets: 140 10*3/uL (ref 140–400)
RBC: 4.44 MIL/uL (ref 4.20–5.80)
RDW: 14.1 % (ref 11.0–15.0)
WBC: 5.7 10*3/uL (ref 3.8–10.8)

## 2017-03-03 LAB — URINALYSIS, MICROSCOPIC ONLY
Bacteria, UA: NONE SEEN [HPF]
Casts: NONE SEEN [LPF]
Crystals: NONE SEEN [HPF]
Yeast: NONE SEEN [HPF]

## 2017-03-03 MED ORDER — LEVOFLOXACIN 500 MG PO TABS: 500.0000 mg | ORAL_TABLET | Freq: Every day | ORAL | 0 refills | Status: DC

## 2017-03-03 NOTE — Progress Notes (Signed)
Subjective:    Patient ID: [name], male    DOB: 1919-06-09, 83 y.o.   MRN: [id number]  HPI  Patient has been sick for 4 days. Over the last 24 hours has become more confused and also a week. He is having difficult time standing. He is here today with his son. 4 days ago he developed a cough and subjective fevers. Cough is nonproductive. Cough seems to be getting worse. On examination today, he does have left-sided crackles. Pulse oximetry is 95% on room air. The remainder of his exam is unremarkable. Stat CBC was obtained which showed a white blood cell count 5.7 with a hemoglobin of 14 and hematocrit 42. Platelet count was normal at 140. Urinalysis was unremarkable. He did have +1 leukocyte esterase but 0 white blood cells on high-power field 0 bacteria and no nitrites. Patient is conversant today answering questions appropriately and does not seem confused beyond his baseline. He does feel weaker. He does not appear dehydrated or toxic Past Medical History:  Diagnosis Date  . Acid reflux   . Cancer ([place]) 09/05/13   left neck-non-hodgkins lymphoma  . Hypertension    requires diuretic  . Hypothyroidism   . Malaria    while in the service  . Peripheral vascular disease ([place])    slow wound healing on legs   Past Surgical History:  Procedure Laterality Date  . AMPUTATION Left 06/25/2015   Procedure: LEFT  HALLUX AMPUTATION,;  Surgeon: Wylene Simmer, MD;  Location: [place];  Service: Orthopedics;  Laterality: Left;  LOCAL/MAC  . MASS BIOPSY Left 09/05/2013   Procedure: EXCISIONAL BIOPSY OF LEFT NECK MASS;  Surgeon: Jerrell Belfast, MD;  Location: [place];  Service: ENT;  Laterality: Left;  . TONSILLECTOMY    . YAG LASER APPLICATION Right 2/94/7654   Procedure: YAG LASER APPLICATION;  Surgeon: [name], MD;  Location: [facility];  Service: Ophthalmology;  Laterality: Right;   Current Outpatient Prescriptions on File Prior to Visit  Medication Sig Dispense Refill  .  aspirin EC 81 MG tablet Take 1 tablet (81 mg total) by mouth daily.    Marland Kitchen atorvastatin (LIPITOR) 10 MG tablet Take 1 tablet (10 mg total) by mouth daily. 30 tablet 2  . cephALEXin (KEFLEX) 250 MG capsule Take 250 mg by mouth 2 (two) times daily.  0  . cephALEXin (KEFLEX) 500 MG capsule Take 1 capsule (500 mg total) by mouth 2 (two) times daily. (Patient not taking: Reported on 02/10/2017) 14 capsule 0  . clotrimazole-betamethasone (LOTRISONE) cream APPLY AS DIRECTED BY DR  2  . enalapril (VASOTEC) 20 MG tablet TAKE 1 TABLET BY MOUTH  EVERY DAY 90 tablet 1  . fluconazole (DIFLUCAN) 100 MG tablet Take 1 tablet (100 mg total) by mouth daily. 7 tablet 0  . furosemide (LASIX) 20 MG tablet TAKE 1 TABLET BY MOUTH  EVERY DAY 90 tablet 1  . gabapentin (NEURONTIN) 100 MG capsule Take 2 capsules every morning 180 capsule 4  . gabapentin (NEURONTIN) 300 MG capsule Take 1 capsule (300 mg total) by mouth at bedtime. 90 capsule 4  . levothyroxine (SYNTHROID, LEVOTHROID) 88 MCG tablet TAKE 1 TABLET BY MOUTH  EVERY DAY 90 tablet 1  . mometasone (ELOCON) 0.1 % ointment APPLY TO AFFECTED AREA EVERY DAY  0  . NONFORMULARY OR COMPOUNDED ITEM [facility]:  Peripheral Neuropathy cream - Bupivacaine 1%, Doxepin 3%, Gabapentin 6%, Pentoxifylline 3%, Topiramate 1%, apply 1-2 grams to affected area 3-4 times daily.  120 each 11  . traMADol (ULTRAM) 50 MG tablet TAKE 1 TABLET BY MOUTH EVERY 8 HOURS AS NEEDED FOR PAIN 30 tablet 0  . triamcinolone cream (KENALOG) 0.1 % APPLY TO AFFECTED AREA TWICE A DAY FOR 2-4 WEEKS AS NEEDED FOR FLARES  1  . vitamin B-12 (CYANOCOBALAMIN) 1000 MCG tablet Take 1 tablet (1,000 mcg total) by mouth daily. 30 tablet 5  . [DISCONTINUED] famotidine (PEPCID) 20 MG tablet Take 20 mg by mouth 2 (two) times daily.     No current facility-administered medications on file prior to visit.    Allergies  Allergen Reactions  . Codeine Nausea And Vomiting  . Hydrocodone Nausea And Vomiting  . Percocet  [Oxycodone-Acetaminophen] Nausea And Vomiting   Social History   Social History  . Marital status: Married    Spouse name: N/A  . Number of children: 4  . Years of education: N/A   Occupational History  . retired    Social History Main Topics  . Smoking status: Former Smoker    Types: Cigars  . Smokeless tobacco: Former Systems developer    Quit date: 10/05/1979  . Alcohol use No  . Drug use: No  . Sexual activity: Not on file   Other Topics Concern  . Not on file   Social History Narrative  . No narrative on file     Review of Systems  All other systems reviewed and are negative.      Objective:   Physical Exam  Constitutional: He appears well-developed and well-nourished.  HENT:  Right Ear: External ear normal.  Left Ear: External ear normal.  Nose: Nose normal.  Mouth/Throat: Oropharynx is clear and moist. No oropharyngeal exudate.  Cardiovascular: Normal rate, regular rhythm and normal heart sounds.  Exam reveals no gallop and no friction rub.   No murmur heard. Pulmonary/Chest: Effort normal. No respiratory distress. He has no wheezes. He has rales.  Abdominal: Soft. Bowel sounds are normal. He exhibits no distension. There is no tenderness. There is no rebound and no guarding.  Musculoskeletal: He exhibits no edema.  Lymphadenopathy:    He has no cervical adenopathy.  Skin: No rash noted. No erythema.  Vitals reviewed.         Assessment & Plan:  Fever, unknown origin - Plan: Urinalysis, Routine w reflex microscopic, CBC, levofloxacin (LEVAQUIN) 500 MG tablet, DISCONTINUED: levofloxacin (LEVAQUIN) 500 MG tablet  Walking pneumonia  I'm concerned the patient may be developing walking pneumonia after recent upper respiratory infection which could be causing some mild delirium and also has weakness and lethargy. Begin Levaquin 500 mg by mouth daily for 7 days. Push fluids. Recheck here on Monday or go to the emergency room over the weekend if worsening. I'm  reassured by the normal white blood cell count and a normal urinalysis. At the present time he does not show signs of being septic or toxic

## 2017-03-07 ENCOUNTER — Telehealth: Payer: Self-pay | Admitting: Family Medicine

## 2017-03-07 NOTE — Telephone Encounter (Signed)
Patient's daughter [name]) [name] wants to stop lipitor due to possible side effects and life expectancy.  Wed discussed the risks and benefits of med and she elected to discontinue lipitor due to short life expectancy.

## 2017-03-07 NOTE — Telephone Encounter (Signed)
[name] may only calling to speak to you regarding his hospital visit and being put on lipitor, has questions about being on this medication [phone]

## 2017-03-20 DIAGNOSIS — H43813 Vitreous degeneration, bilateral: Secondary | ICD-10-CM | POA: Diagnosis not present

## 2017-03-20 DIAGNOSIS — H26102 Unspecified traumatic cataract, left eye: Secondary | ICD-10-CM | POA: Diagnosis not present

## 2017-03-20 DIAGNOSIS — Z9841 Cataract extraction status, right eye: Secondary | ICD-10-CM | POA: Diagnosis not present

## 2017-03-20 DIAGNOSIS — H353223 Exudative age-related macular degeneration, left eye, with inactive scar: Secondary | ICD-10-CM | POA: Diagnosis not present

## 2017-03-20 DIAGNOSIS — H34811 Central retinal vein occlusion, right eye, with macular edema: Secondary | ICD-10-CM | POA: Diagnosis not present

## 2017-03-20 DIAGNOSIS — H1712 Central corneal opacity, left eye: Secondary | ICD-10-CM | POA: Diagnosis not present

## 2017-03-20 DIAGNOSIS — H353211 Exudative age-related macular degeneration, right eye, with active choroidal neovascularization: Secondary | ICD-10-CM | POA: Diagnosis not present

## 2017-03-22 DIAGNOSIS — Z85828 Personal history of other malignant neoplasm of skin: Secondary | ICD-10-CM | POA: Diagnosis not present

## 2017-03-22 DIAGNOSIS — B353 Tinea pedis: Secondary | ICD-10-CM | POA: Diagnosis not present

## 2017-03-22 DIAGNOSIS — B351 Tinea unguium: Secondary | ICD-10-CM | POA: Diagnosis not present

## 2017-04-17 DIAGNOSIS — H43813 Vitreous degeneration, bilateral: Secondary | ICD-10-CM | POA: Diagnosis not present

## 2017-04-17 DIAGNOSIS — H353211 Exudative age-related macular degeneration, right eye, with active choroidal neovascularization: Secondary | ICD-10-CM | POA: Diagnosis not present

## 2017-04-17 DIAGNOSIS — H353223 Exudative age-related macular degeneration, left eye, with inactive scar: Secondary | ICD-10-CM | POA: Diagnosis not present

## 2017-04-18 ENCOUNTER — Other Ambulatory Visit: Payer: Self-pay | Admitting: Family Medicine

## 2017-05-18 DIAGNOSIS — H353223 Exudative age-related macular degeneration, left eye, with inactive scar: Secondary | ICD-10-CM | POA: Diagnosis not present

## 2017-05-18 DIAGNOSIS — H43813 Vitreous degeneration, bilateral: Secondary | ICD-10-CM | POA: Diagnosis not present

## 2017-05-18 DIAGNOSIS — H353211 Exudative age-related macular degeneration, right eye, with active choroidal neovascularization: Secondary | ICD-10-CM | POA: Diagnosis not present

## 2017-05-22 ENCOUNTER — Other Ambulatory Visit: Payer: Self-pay | Admitting: Family Medicine

## 2017-05-22 NOTE — Telephone Encounter (Signed)
Approved. # 30 + 0. 

## 2017-05-22 NOTE — Telephone Encounter (Signed)
Ok to refill??      LOV 03/03/17 LRF - 02/02/17

## 2017-06-15 ENCOUNTER — Telehealth: Payer: Self-pay | Admitting: Oncology

## 2017-06-15 NOTE — Telephone Encounter (Signed)
Faxed records to the [facility]

## 2017-06-19 DIAGNOSIS — H353211 Exudative age-related macular degeneration, right eye, with active choroidal neovascularization: Secondary | ICD-10-CM | POA: Diagnosis not present

## 2017-06-19 DIAGNOSIS — H43813 Vitreous degeneration, bilateral: Secondary | ICD-10-CM | POA: Diagnosis not present

## 2017-06-19 DIAGNOSIS — H353223 Exudative age-related macular degeneration, left eye, with inactive scar: Secondary | ICD-10-CM | POA: Diagnosis not present

## 2017-08-01 ENCOUNTER — Ambulatory Visit (INDEPENDENT_AMBULATORY_CARE_PROVIDER_SITE_OTHER): Payer: Medicare Other | Admitting: Family Medicine

## 2017-08-01 ENCOUNTER — Encounter: Payer: Self-pay | Admitting: Family Medicine

## 2017-08-01 VITALS — BP 130/82 | HR 88 | Temp 98.0°F | Resp 18

## 2017-08-01 DIAGNOSIS — I83012 Varicose veins of right lower extremity with ulcer of calf: Secondary | ICD-10-CM | POA: Diagnosis not present

## 2017-08-01 DIAGNOSIS — L97212 Non-pressure chronic ulcer of right calf with fat layer exposed: Secondary | ICD-10-CM | POA: Diagnosis not present

## 2017-08-01 DIAGNOSIS — L0390 Cellulitis, unspecified: Secondary | ICD-10-CM

## 2017-08-01 MED ORDER — CEPHALEXIN 500 MG PO CAPS: 500.0000 mg | ORAL_CAPSULE | Freq: Three times a day (TID) | ORAL | 0 refills | Status: DC

## 2017-08-01 NOTE — Progress Notes (Signed)
Subjective:    Patient ID: [name], male    DOB: 01/16/1919, 83 y.o.   MRN: [id number]  HPI Patient presents today with his daughter complaining of pain in his right leg. There is +2 pitting edema in the right leg with numerous petechiae and purpura circumferentially forming around the anterior shin onto his calf. There are chronic venous stasis changes all along the right leg. While palpating the leg, there is pitting edema in the front but then the patient is exquisitely tender to palpation posteriorly in his calf. I then examined the calf. There is an area approximately 3 cm in diameter of a shallow superficial ulcer with a central venous stasis older down to the subcutaneous fat that is approximately the diameter of a nickel. There is surrounding erythema and warmth and pain in the skin around this. The skin is firm and indurated and appears to be infected. Daughter also states that the patient is tired and lethargic. He spends the majority of his day sleeping. He is not drinking very much yet he is still taking a diuretic. He is not eating well. Some of the pitting edema in his legs I'm concerned is third spacing secondary to loss of oncotic pressure due to protein calorie malnutrition. He is becoming progressively weaker and is now barely able to get in and out of bed only with maximum assistance. Past Medical History:  Diagnosis Date  . Acid reflux   . Cancer ([place]) 09/05/13   left neck-non-hodgkins lymphoma  . Hypertension    requires diuretic  . Hypothyroidism   . Malaria    while in the service  . Peripheral vascular disease ([place])    slow wound healing on legs   Past Surgical History:  Procedure Laterality Date  . AMPUTATION Left 06/25/2015   Procedure: LEFT  HALLUX AMPUTATION,;  Surgeon: Wylene Simmer, MD;  Location: [place];  Service: Orthopedics;  Laterality: Left;  LOCAL/MAC  . MASS BIOPSY Left 09/05/2013   Procedure: EXCISIONAL BIOPSY OF LEFT NECK MASS;   Surgeon: Jerrell Belfast, MD;  Location: [place];  Service: ENT;  Laterality: Left;  . TONSILLECTOMY    . YAG LASER APPLICATION Right 2/40/9735   Procedure: YAG LASER APPLICATION;  Surgeon: [name], MD;  [facility];  Service: Ophthalmology;  Laterality: Right;   Current Outpatient Prescriptions on File Prior to Visit  Medication Sig Dispense Refill  . aspirin EC 81 MG tablet Take 1 tablet (81 mg total) by mouth daily.    . enalapril (VASOTEC) 20 MG tablet TAKE 1 TABLET BY MOUTH  EVERY DAY 90 tablet 3  . furosemide (LASIX) 20 MG tablet TAKE 1 TABLET BY MOUTH  EVERY DAY 90 tablet 1  . gabapentin (NEURONTIN) 100 MG capsule Take 2 capsules every morning (Patient taking differently: 100 mg daily after lunch. ) 180 capsule 4  . gabapentin (NEURONTIN) 300 MG capsule Take 1 capsule (300 mg total) by mouth at bedtime. (Patient taking differently: Take 300 mg by mouth 2 (two) times daily. ) 90 capsule 4  . levothyroxine (SYNTHROID, LEVOTHROID) 88 MCG tablet TAKE 1 TABLET BY MOUTH  EVERY DAY 90 tablet 3  . mometasone (ELOCON) 0.1 % ointment APPLY TO AFFECTED AREA EVERY DAY  0  . NONFORMULARY OR COMPOUNDED ITEM [facility]:  Peripheral Neuropathy cream - Bupivacaine 1%, Doxepin 3%, Gabapentin 6%, Pentoxifylline 3%, Topiramate 1%, apply 1-2 grams to affected area 3-4 times daily. 120 each 11  . traMADol (ULTRAM) 50 MG  tablet TAKE 1 TABLET BY MOUTH EVERY 8 HOURS AS NEEDED FOR PAIN 30 tablet 0  . triamcinolone cream (KENALOG) 0.1 % APPLY TO AFFECTED AREA TWICE A DAY FOR 2-4 WEEKS AS NEEDED FOR FLARES  1  . vitamin B-12 (CYANOCOBALAMIN) 1000 MCG tablet Take 1 tablet (1,000 mcg total) by mouth daily. 30 tablet 5  . [DISCONTINUED] famotidine (PEPCID) 20 MG tablet Take 20 mg by mouth 2 (two) times daily.     No current facility-administered medications on file prior to visit.    Allergies  Allergen Reactions  . Codeine Nausea And Vomiting  . Hydrocodone Nausea And Vomiting  . Percocet  [Oxycodone-Acetaminophen] Nausea And Vomiting   Social History   Social History  . Marital status: Married    Spouse name: N/A  . Number of children: 4  . Years of education: N/A   Occupational History  . retired    Social History Main Topics  . Smoking status: Former Smoker    Types: Cigars  . Smokeless tobacco: Former Systems developer    Quit date: 10/05/1979  . Alcohol use No  . Drug use: No  . Sexual activity: Not on file   Other Topics Concern  . Not on file   Social History Narrative  . No narrative on file      Review of Systems  All other systems reviewed and are negative.      Objective:   Physical Exam  Cardiovascular: Normal rate, regular rhythm and normal heart sounds.   Pulmonary/Chest: Effort normal and breath sounds normal. No respiratory distress. He has no wheezes. He has no rales.  Musculoskeletal: He exhibits edema.       Legs: Skin: Rash noted. There is erythema.  Vitals reviewed.         Assessment & Plan:  Cellulitis, unspecified cellulitis site - Plan: CBC with Differential/Platelet, COMPLETE METABOLIC PANEL WITH GFR  Venous stasis ulcer of right calf with fat layer exposed, unspecified whether varicose veins present [facility])  Patient has pitting edema in both legs secondary to third spacing, chronic venous insufficiency. This is created a venous stasis ulcer of the right calf which has progressively worsened and now extends into the subcutaneous fat. There is also surrounding erythema concerning for secondary cellulitis in addition to petechiae and purpura due to the swelling of venous insufficiency. I believe the patient is also likely dehydrated secondary to diuretic use and lack of fluid intake along with protein calorie malnutrition. I will the patient begin taking ensures twice a day. I want him to increase his fluid intake and discontinue his diuretic. I will check a CBC as well as a CMP. I will start the patient on Keflex 500 mg by mouth 3 times a  day for the cellulitis. The patient was placed in an Unna boot to control the swelling and facilitate healing of the venous stasis ulcer. I'll recheck the patient on Friday or sooner if worse.

## 2017-08-02 ENCOUNTER — Other Ambulatory Visit: Payer: Self-pay | Admitting: Family Medicine

## 2017-08-02 LAB — CBC WITH DIFFERENTIAL/PLATELET
Basophils Absolute: 63 cells/uL (ref 0–200)
Basophils Relative: 1 %
Eosinophils Absolute: 252 cells/uL (ref 15–500)
Eosinophils Relative: 4 %
HCT: 40.1 % (ref 38.5–50.0)
Hemoglobin: 13.3 g/dL (ref 13.0–17.0)
Lymphocytes Relative: 22 %
Lymphs Abs: 1386 cells/uL (ref 850–3900)
MCH: 31.1 pg (ref 27.0–33.0)
MCHC: 33.2 g/dL (ref 32.0–36.0)
MCV: 93.7 fL (ref 80.0–100.0)
MPV: 10.2 fL (ref 7.5–12.5)
Monocytes Absolute: 819 cells/uL (ref 200–950)
Monocytes Relative: 13 %
Neutro Abs: 3780 cells/uL (ref 1500–7800)
Neutrophils Relative %: 60 %
Platelets: 242 10*3/uL (ref 140–400)
RBC: 4.28 MIL/uL (ref 4.20–5.80)
RDW: 13.3 % (ref 11.0–15.0)
WBC: 6.3 10*3/uL (ref 3.8–10.8)

## 2017-08-02 LAB — COMPLETE METABOLIC PANEL WITH GFR
ALT: 11 U/L (ref 9–46)
AST: 15 U/L (ref 10–35)
Albumin: 3.8 g/dL (ref 3.6–5.1)
Alkaline Phosphatase: 61 U/L (ref 40–115)
BUN: 29 mg/dL — ABNORMAL HIGH (ref 7–25)
CO2: 26 mmol/L (ref 20–32)
Calcium: 9.2 mg/dL (ref 8.6–10.3)
Chloride: 103 mmol/L (ref 98–110)
Creat: 1.85 mg/dL — ABNORMAL HIGH (ref 0.70–1.11)
GFR, Est African American: 35 mL/min — ABNORMAL LOW (ref >=60)
GFR, Est Non African American: 30 mL/min — ABNORMAL LOW (ref >=60)
Glucose, Bld: 102 mg/dL — ABNORMAL HIGH (ref 70–99)
Potassium: 4.6 mmol/L (ref 3.5–5.3)
Sodium: 141 mmol/L (ref 135–146)
Total Bilirubin: 0.5 mg/dL (ref 0.2–1.2)
Total Protein: 6.8 g/dL (ref 6.1–8.1)

## 2017-08-02 NOTE — Telephone Encounter (Signed)
Ok to refill 

## 2017-08-03 NOTE — Telephone Encounter (Signed)
ok 

## 2017-08-03 NOTE — Telephone Encounter (Signed)
Medication called to pharmacy. 

## 2017-08-04 ENCOUNTER — Ambulatory Visit (INDEPENDENT_AMBULATORY_CARE_PROVIDER_SITE_OTHER): Payer: Medicare Other | Admitting: Family Medicine

## 2017-08-04 ENCOUNTER — Encounter: Payer: Self-pay | Admitting: Family Medicine

## 2017-08-04 VITALS — BP 122/80 | HR 82 | Temp 98.1°F | Resp 18

## 2017-08-04 DIAGNOSIS — I83012 Varicose veins of right lower extremity with ulcer of calf: Secondary | ICD-10-CM

## 2017-08-04 DIAGNOSIS — L97212 Non-pressure chronic ulcer of right calf with fat layer exposed: Secondary | ICD-10-CM | POA: Diagnosis not present

## 2017-08-04 DIAGNOSIS — R5382 Chronic fatigue, unspecified: Secondary | ICD-10-CM

## 2017-08-04 DIAGNOSIS — R413 Other amnesia: Secondary | ICD-10-CM | POA: Diagnosis not present

## 2017-08-04 DIAGNOSIS — L0390 Cellulitis, unspecified: Secondary | ICD-10-CM | POA: Diagnosis not present

## 2017-08-04 DIAGNOSIS — R410 Disorientation, unspecified: Secondary | ICD-10-CM | POA: Diagnosis not present

## 2017-08-04 MED ORDER — MIRTAZAPINE 30 MG PO TABS: 30.0000 mg | ORAL_TABLET | Freq: Every day | ORAL | 3 refills | Status: DC

## 2017-08-04 NOTE — Progress Notes (Signed)
Subjective:    Patient ID: [name], male    DOB: [birth year], 83 y.o.   MRN: [id number]  HPI  08/01/17 Patient presents today with his daughter complaining of pain in his right leg. There is +2 pitting edema in the right leg with numerous petechiae and purpura circumferentially forming around the anterior shin onto his calf. There are chronic venous stasis changes all along the right leg. While palpating the leg, there is pitting edema in the front but then the patient is exquisitely tender to palpation posteriorly in his calf. I then examined the calf. There is an area approximately 3 cm in diameter of a shallow superficial ulcer with a central venous stasis older down to the subcutaneous fat that is approximately the diameter of a nickel. There is surrounding erythema and warmth and pain in the skin around this. The skin is firm and indurated and appears to be infected. Daughter also states that the patient is tired and lethargic. He spends the majority of his day sleeping. He is not drinking very much yet he is still taking a diuretic. He is not eating well. Some of the pitting edema in his legs I'm concerned is third spacing secondary to loss of oncotic pressure due to protein calorie malnutrition. He is becoming progressively weaker and is now barely able to get in and out of bed only with maximum assistance.  At that time, my plan was: )  Patient has pitting edema in both legs secondary to third spacing, chronic venous insufficiency. This is created a venous stasis ulcer of the right calf which has progressively worsened and now extends into the subcutaneous fat. There is also surrounding erythema concerning for secondary cellulitis in addition to petechiae and purpura due to the swelling of venous insufficiency. I believe the patient is also likely dehydrated secondary to diuretic use and lack of fluid intake along with protein calorie malnutrition. I will the patient begin taking ensures twice  a day. I want him to increase his fluid intake and discontinue his diuretic. I will check a CBC as well as a CMP. I will start the patient on Keflex 500 mg by mouth 3 times a day for the cellulitis. The patient was placed in an Unna boot to control the swelling and facilitate healing of the venous stasis ulcer. I'll recheck the patient on Friday or sooner if worse.  08/04/17 Labs confirm dehydration. Creatinine was greater than 1.8. Therefore we discontinued Lasix. Today the patient seems brighter and stronger than he here earlier this week. His daughter continues to report that he has no energy. He can barely stand and only with maximum assistance. Thankfully the pain in his leg has improved. The Unna boot was removed today. There is still an ulcer on his posterior right calf roughly the size of a $0.50 piece. However the central ulcer down to the muscle has started to heal in nicely and is now extremely shallow. The daughter reports that there is significant drainage at home and I question if the patient may benefit from calcium alginate in addition to an [employer]. Thankfully at this point, the pain in his leg seems better. The biggest issue at present now seems to be regaining his strength and his mobility. He is still not sleeping well. Both the patient and his daughter report that he is not eating and that he has very little appetite. He is using tramadol at night for the pain and it seems to be working well.  He denies any other symptoms. Past Medical History:  Diagnosis Date  . Acid reflux   . Cancer ([place]) 09/05/13   left neck-non-hodgkins lymphoma  . Hypertension    requires diuretic  . Hypothyroidism   . Malaria    while in the service  . Peripheral vascular disease ([place])    slow wound healing on legs   Past Surgical History:  Procedure Laterality Date  . AMPUTATION Left 06/25/2015   Procedure: LEFT  HALLUX AMPUTATION,;  Surgeon: Wylene Simmer, MD;  Location: [place];   Service: Orthopedics;  Laterality: Left;  LOCAL/MAC  . MASS BIOPSY Left 09/05/2013   Procedure: EXCISIONAL BIOPSY OF LEFT NECK MASS;  Surgeon: Jerrell Belfast, MD;  Location: [place];  Service: ENT;  Laterality: Left;  . TONSILLECTOMY    . YAG LASER APPLICATION Right 0/08/2724   Procedure: YAG LASER APPLICATION;  Surgeon: [name], MD;  [facility];  Service: Ophthalmology;  Laterality: Right;   Current Outpatient Prescriptions on File Prior to Visit  Medication Sig Dispense Refill  . aspirin EC 81 MG tablet Take 1 tablet (81 mg total) by mouth daily.    . cephALEXin (KEFLEX) 500 MG capsule Take 1 capsule (500 mg total) by mouth 3 (three) times daily. 21 capsule 0  . enalapril (VASOTEC) 20 MG tablet TAKE 1 TABLET BY MOUTH  EVERY DAY 90 tablet 3  . gabapentin (NEURONTIN) 100 MG capsule Take 2 capsules every morning (Patient taking differently: 100 mg daily after lunch. ) 180 capsule 4  . gabapentin (NEURONTIN) 300 MG capsule Take 1 capsule (300 mg total) by mouth at bedtime. (Patient taking differently: Take 300 mg by mouth 2 (two) times daily. ) 90 capsule 4  . levothyroxine (SYNTHROID, LEVOTHROID) 88 MCG tablet TAKE 1 TABLET BY MOUTH  EVERY DAY 90 tablet 3  . mometasone (ELOCON) 0.1 % ointment APPLY TO AFFECTED AREA EVERY DAY  0  . NONFORMULARY OR COMPOUNDED ITEM [facility]:  Peripheral Neuropathy cream - Bupivacaine 1%, Doxepin 3%, Gabapentin 6%, Pentoxifylline 3%, Topiramate 1%, apply 1-2 grams to affected area 3-4 times daily. 120 each 11  . traMADol (ULTRAM) 50 MG tablet TAKE 1 TABLET EVERY 8 HOURS AS NEEDED FOR PAIN 30 tablet 1  . triamcinolone cream (KENALOG) 0.1 % APPLY TO AFFECTED AREA TWICE A DAY FOR 2-4 WEEKS AS NEEDED FOR FLARES  1  . vitamin B-12 (CYANOCOBALAMIN) 1000 MCG tablet Take 1 tablet (1,000 mcg total) by mouth daily. 30 tablet 5  . furosemide (LASIX) 20 MG tablet TAKE 1 TABLET BY MOUTH  EVERY DAY (Patient not taking: Reported on 08/04/2017) 90 tablet 1  .  [DISCONTINUED] famotidine (PEPCID) 20 MG tablet Take 20 mg by mouth 2 (two) times daily.     No current facility-administered medications on file prior to visit.    Allergies  Allergen Reactions  . Codeine Nausea And Vomiting  . Hydrocodone Nausea And Vomiting  . Percocet [Oxycodone-Acetaminophen] Nausea And Vomiting   Social History   Social History  . Marital status: Married    Spouse name: N/A  . Number of children: 4  . Years of education: N/A   Occupational History  . retired    Social History Main Topics  . Smoking status: Former Smoker    Types: Cigars  . Smokeless tobacco: Former Systems developer    Quit date: 10/05/1979  . Alcohol use No  . Drug use: No  . Sexual activity: Not on file   Other Topics Concern  .  Not on file   Social History Narrative  . No narrative on file      Review of Systems  All other systems reviewed and are negative.      Objective:   Physical Exam  Cardiovascular: Normal rate, regular rhythm and normal heart sounds.   Pulmonary/Chest: Effort normal and breath sounds normal. No respiratory distress. He has no wheezes. He has no rales.  Musculoskeletal: He exhibits edema.       Legs: Skin: Rash noted. There is erythema.  Vitals reviewed.         Assessment & Plan:  Chronic fatigue - Plan: COMPLETE METABOLIC PANEL WITH GFR, TSH, Vitamin B12  Cellulitis, unspecified cellulitis site  Venous stasis ulcer of right calf with fat layer exposed, unspecified whether varicose veins present ([place])  The wound looks better. However this is to take quite some time to heal. He will need 2-3 times every week Unna boot dressing changes until healed. I will try to schedule the patient have home health come to his home and make the dressing changes. I will also ask for a wound consult from home health to see if they believe he would benefit from calcium alginate in addition to the [employer]. The cellulitis seems to be resolving on the Keflex. I would  like the patient to finish her course of antibiotics. I would also like to start the patient on Remeron 30 mg by mouth daily at bedtime to see if this will help him sleep better and also spark his appetite. Continue to hold Lasix for now. Hopefully after an additional week of becoming better hydrated, increasing appetite, and treating his infection, his strength will continue to improve and possibly we can start physical therapy at home. I will also look for reversible causes of fatigue by checking a B12 level as well as a TSH and also recheck his renal function.  I have asked the daughter to call me next week with an update to determine if he can start physical therapy at home. Otherwise we may need to consider short-term placement in a rehabilitation facility.

## 2017-08-05 LAB — COMPLETE METABOLIC PANEL WITH GFR
AG Ratio: 1.2 (calc) (ref 1.0–2.5)
ALT: 16 U/L (ref 9–46)
AST: 25 U/L (ref 10–35)
Albumin: 3.6 g/dL (ref 3.6–5.1)
Alkaline phosphatase (APISO): 61 U/L (ref 40–115)
BUN/Creatinine Ratio: 17 (calc) (ref 6–22)
BUN: 24 mg/dL (ref 7–25)
CO2: 26 mmol/L (ref 20–32)
Calcium: 9.1 mg/dL (ref 8.6–10.3)
Chloride: 103 mmol/L (ref 98–110)
Creat: 1.45 mg/dL — ABNORMAL HIGH (ref 0.70–1.11)
GFR, Est African American: 47 mL/min/{1.73_m2} — ABNORMAL LOW (ref >=60)
GFR, Est Non African American: 41 mL/min/{1.73_m2} — ABNORMAL LOW (ref >=60)
Globulin: 3.1 g/dL (calc) (ref 1.9–3.7)
Glucose, Bld: 102 mg/dL — ABNORMAL HIGH (ref 65–99)
Potassium: 5.1 mmol/L (ref 3.5–5.3)
Sodium: 141 mmol/L (ref 135–146)
Total Bilirubin: 0.4 mg/dL (ref 0.2–1.2)
Total Protein: 6.7 g/dL (ref 6.1–8.1)

## 2017-08-05 LAB — VITAMIN B12: Vitamin B-12: 1093 pg/mL (ref 200–1100)

## 2017-08-05 LAB — EXTRA LAV TOP TUBE

## 2017-08-05 LAB — TSH: TSH: 7.39 mIU/L — ABNORMAL HIGH (ref 0.40–4.50)

## 2017-08-07 ENCOUNTER — Other Ambulatory Visit: Payer: Self-pay | Admitting: *Deleted

## 2017-08-07 DIAGNOSIS — H353223 Exudative age-related macular degeneration, left eye, with inactive scar: Secondary | ICD-10-CM | POA: Diagnosis not present

## 2017-08-07 DIAGNOSIS — H43813 Vitreous degeneration, bilateral: Secondary | ICD-10-CM | POA: Diagnosis not present

## 2017-08-07 DIAGNOSIS — H353211 Exudative age-related macular degeneration, right eye, with active choroidal neovascularization: Secondary | ICD-10-CM | POA: Diagnosis not present

## 2017-08-07 MED ORDER — LEVOTHYROXINE SODIUM 100 MCG PO TABS: 100.0000 ug | ORAL_TABLET | Freq: Every day | ORAL | 2 refills | Status: DC

## 2017-08-08 DIAGNOSIS — H3530 Unspecified macular degeneration: Secondary | ICD-10-CM | POA: Diagnosis not present

## 2017-08-08 DIAGNOSIS — G4700 Insomnia, unspecified: Secondary | ICD-10-CM | POA: Diagnosis not present

## 2017-08-08 DIAGNOSIS — K219 Gastro-esophageal reflux disease without esophagitis: Secondary | ICD-10-CM | POA: Diagnosis not present

## 2017-08-08 DIAGNOSIS — I83012 Varicose veins of right lower extremity with ulcer of calf: Secondary | ICD-10-CM | POA: Diagnosis not present

## 2017-08-08 DIAGNOSIS — I10 Essential (primary) hypertension: Secondary | ICD-10-CM | POA: Diagnosis not present

## 2017-08-08 DIAGNOSIS — Z8572 Personal history of non-Hodgkin lymphomas: Secondary | ICD-10-CM | POA: Diagnosis not present

## 2017-08-08 DIAGNOSIS — L97212 Non-pressure chronic ulcer of right calf with fat layer exposed: Secondary | ICD-10-CM | POA: Diagnosis not present

## 2017-08-08 DIAGNOSIS — L0390 Cellulitis, unspecified: Secondary | ICD-10-CM | POA: Diagnosis not present

## 2017-08-08 DIAGNOSIS — Z9181 History of falling: Secondary | ICD-10-CM | POA: Diagnosis not present

## 2017-08-08 DIAGNOSIS — I739 Peripheral vascular disease, unspecified: Secondary | ICD-10-CM | POA: Diagnosis not present

## 2017-08-08 DIAGNOSIS — E039 Hypothyroidism, unspecified: Secondary | ICD-10-CM | POA: Diagnosis not present

## 2017-08-09 ENCOUNTER — Telehealth: Payer: Self-pay | Admitting: *Deleted

## 2017-08-09 NOTE — Telephone Encounter (Signed)
Received call from patient daughter, [name].   Reports that patient is starting to improve. Reports that he is gaining more energy and his appetite has also improved.   MD to be made aware.

## 2017-08-10 ENCOUNTER — Other Ambulatory Visit: Payer: Self-pay | Admitting: Family Medicine

## 2017-08-10 DIAGNOSIS — S81801A Unspecified open wound, right lower leg, initial encounter: Secondary | ICD-10-CM | POA: Diagnosis not present

## 2017-08-10 DIAGNOSIS — R29898 Other symptoms and signs involving the musculoskeletal system: Secondary | ICD-10-CM

## 2017-08-10 NOTE — Telephone Encounter (Signed)
I want to get home PT scheduled.  Order placed.

## 2017-08-10 NOTE — Telephone Encounter (Signed)
Referral coordinator made aware.

## 2017-08-10 NOTE — Telephone Encounter (Signed)
Order has been faxed to [facility].

## 2017-08-14 DIAGNOSIS — I83012 Varicose veins of right lower extremity with ulcer of calf: Secondary | ICD-10-CM | POA: Diagnosis not present

## 2017-08-14 DIAGNOSIS — K219 Gastro-esophageal reflux disease without esophagitis: Secondary | ICD-10-CM | POA: Diagnosis not present

## 2017-08-14 DIAGNOSIS — Z8572 Personal history of non-Hodgkin lymphomas: Secondary | ICD-10-CM | POA: Diagnosis not present

## 2017-08-14 DIAGNOSIS — I10 Essential (primary) hypertension: Secondary | ICD-10-CM | POA: Diagnosis not present

## 2017-08-14 DIAGNOSIS — L97212 Non-pressure chronic ulcer of right calf with fat layer exposed: Secondary | ICD-10-CM | POA: Diagnosis not present

## 2017-08-14 DIAGNOSIS — G4700 Insomnia, unspecified: Secondary | ICD-10-CM | POA: Diagnosis not present

## 2017-08-14 DIAGNOSIS — L0390 Cellulitis, unspecified: Secondary | ICD-10-CM | POA: Diagnosis not present

## 2017-08-14 DIAGNOSIS — I739 Peripheral vascular disease, unspecified: Secondary | ICD-10-CM | POA: Diagnosis not present

## 2017-08-14 DIAGNOSIS — Z9181 History of falling: Secondary | ICD-10-CM | POA: Diagnosis not present

## 2017-08-14 DIAGNOSIS — H3530 Unspecified macular degeneration: Secondary | ICD-10-CM | POA: Diagnosis not present

## 2017-08-14 DIAGNOSIS — E039 Hypothyroidism, unspecified: Secondary | ICD-10-CM | POA: Diagnosis not present

## 2017-08-16 DIAGNOSIS — I10 Essential (primary) hypertension: Secondary | ICD-10-CM | POA: Diagnosis not present

## 2017-08-16 DIAGNOSIS — G4700 Insomnia, unspecified: Secondary | ICD-10-CM | POA: Diagnosis not present

## 2017-08-16 DIAGNOSIS — E039 Hypothyroidism, unspecified: Secondary | ICD-10-CM | POA: Diagnosis not present

## 2017-08-16 DIAGNOSIS — Z9181 History of falling: Secondary | ICD-10-CM | POA: Diagnosis not present

## 2017-08-16 DIAGNOSIS — K219 Gastro-esophageal reflux disease without esophagitis: Secondary | ICD-10-CM | POA: Diagnosis not present

## 2017-08-16 DIAGNOSIS — L97212 Non-pressure chronic ulcer of right calf with fat layer exposed: Secondary | ICD-10-CM | POA: Diagnosis not present

## 2017-08-16 DIAGNOSIS — L0390 Cellulitis, unspecified: Secondary | ICD-10-CM | POA: Diagnosis not present

## 2017-08-16 DIAGNOSIS — I739 Peripheral vascular disease, unspecified: Secondary | ICD-10-CM | POA: Diagnosis not present

## 2017-08-16 DIAGNOSIS — Z8572 Personal history of non-Hodgkin lymphomas: Secondary | ICD-10-CM | POA: Diagnosis not present

## 2017-08-16 DIAGNOSIS — I83012 Varicose veins of right lower extremity with ulcer of calf: Secondary | ICD-10-CM | POA: Diagnosis not present

## 2017-08-16 DIAGNOSIS — H3530 Unspecified macular degeneration: Secondary | ICD-10-CM | POA: Diagnosis not present

## 2017-08-18 DIAGNOSIS — H3530 Unspecified macular degeneration: Secondary | ICD-10-CM | POA: Diagnosis not present

## 2017-08-18 DIAGNOSIS — E039 Hypothyroidism, unspecified: Secondary | ICD-10-CM | POA: Diagnosis not present

## 2017-08-18 DIAGNOSIS — I739 Peripheral vascular disease, unspecified: Secondary | ICD-10-CM | POA: Diagnosis not present

## 2017-08-18 DIAGNOSIS — L0390 Cellulitis, unspecified: Secondary | ICD-10-CM | POA: Diagnosis not present

## 2017-08-18 DIAGNOSIS — Z9181 History of falling: Secondary | ICD-10-CM | POA: Diagnosis not present

## 2017-08-18 DIAGNOSIS — L97212 Non-pressure chronic ulcer of right calf with fat layer exposed: Secondary | ICD-10-CM | POA: Diagnosis not present

## 2017-08-18 DIAGNOSIS — K219 Gastro-esophageal reflux disease without esophagitis: Secondary | ICD-10-CM | POA: Diagnosis not present

## 2017-08-18 DIAGNOSIS — I83012 Varicose veins of right lower extremity with ulcer of calf: Secondary | ICD-10-CM | POA: Diagnosis not present

## 2017-08-18 DIAGNOSIS — Z8572 Personal history of non-Hodgkin lymphomas: Secondary | ICD-10-CM | POA: Diagnosis not present

## 2017-08-18 DIAGNOSIS — G4700 Insomnia, unspecified: Secondary | ICD-10-CM | POA: Diagnosis not present

## 2017-08-18 DIAGNOSIS — I10 Essential (primary) hypertension: Secondary | ICD-10-CM | POA: Diagnosis not present

## 2017-08-21 DIAGNOSIS — K219 Gastro-esophageal reflux disease without esophagitis: Secondary | ICD-10-CM | POA: Diagnosis not present

## 2017-08-21 DIAGNOSIS — I83012 Varicose veins of right lower extremity with ulcer of calf: Secondary | ICD-10-CM | POA: Diagnosis not present

## 2017-08-21 DIAGNOSIS — Z8572 Personal history of non-Hodgkin lymphomas: Secondary | ICD-10-CM | POA: Diagnosis not present

## 2017-08-21 DIAGNOSIS — H3530 Unspecified macular degeneration: Secondary | ICD-10-CM | POA: Diagnosis not present

## 2017-08-21 DIAGNOSIS — E039 Hypothyroidism, unspecified: Secondary | ICD-10-CM | POA: Diagnosis not present

## 2017-08-21 DIAGNOSIS — I10 Essential (primary) hypertension: Secondary | ICD-10-CM | POA: Diagnosis not present

## 2017-08-21 DIAGNOSIS — L97212 Non-pressure chronic ulcer of right calf with fat layer exposed: Secondary | ICD-10-CM | POA: Diagnosis not present

## 2017-08-21 DIAGNOSIS — G4700 Insomnia, unspecified: Secondary | ICD-10-CM | POA: Diagnosis not present

## 2017-08-21 DIAGNOSIS — L0390 Cellulitis, unspecified: Secondary | ICD-10-CM | POA: Diagnosis not present

## 2017-08-21 DIAGNOSIS — I739 Peripheral vascular disease, unspecified: Secondary | ICD-10-CM | POA: Diagnosis not present

## 2017-08-21 DIAGNOSIS — Z9181 History of falling: Secondary | ICD-10-CM | POA: Diagnosis not present

## 2017-08-23 DIAGNOSIS — Z9181 History of falling: Secondary | ICD-10-CM | POA: Diagnosis not present

## 2017-08-23 DIAGNOSIS — L97212 Non-pressure chronic ulcer of right calf with fat layer exposed: Secondary | ICD-10-CM | POA: Diagnosis not present

## 2017-08-23 DIAGNOSIS — G4700 Insomnia, unspecified: Secondary | ICD-10-CM | POA: Diagnosis not present

## 2017-08-23 DIAGNOSIS — L0390 Cellulitis, unspecified: Secondary | ICD-10-CM | POA: Diagnosis not present

## 2017-08-23 DIAGNOSIS — Z8572 Personal history of non-Hodgkin lymphomas: Secondary | ICD-10-CM | POA: Diagnosis not present

## 2017-08-23 DIAGNOSIS — I83012 Varicose veins of right lower extremity with ulcer of calf: Secondary | ICD-10-CM | POA: Diagnosis not present

## 2017-08-23 DIAGNOSIS — K219 Gastro-esophageal reflux disease without esophagitis: Secondary | ICD-10-CM | POA: Diagnosis not present

## 2017-08-23 DIAGNOSIS — E039 Hypothyroidism, unspecified: Secondary | ICD-10-CM | POA: Diagnosis not present

## 2017-08-23 DIAGNOSIS — I739 Peripheral vascular disease, unspecified: Secondary | ICD-10-CM | POA: Diagnosis not present

## 2017-08-23 DIAGNOSIS — I10 Essential (primary) hypertension: Secondary | ICD-10-CM | POA: Diagnosis not present

## 2017-08-23 DIAGNOSIS — H3530 Unspecified macular degeneration: Secondary | ICD-10-CM | POA: Diagnosis not present

## 2017-08-24 DIAGNOSIS — Z9181 History of falling: Secondary | ICD-10-CM | POA: Diagnosis not present

## 2017-08-24 DIAGNOSIS — L97212 Non-pressure chronic ulcer of right calf with fat layer exposed: Secondary | ICD-10-CM | POA: Diagnosis not present

## 2017-08-24 DIAGNOSIS — I739 Peripheral vascular disease, unspecified: Secondary | ICD-10-CM | POA: Diagnosis not present

## 2017-08-24 DIAGNOSIS — E039 Hypothyroidism, unspecified: Secondary | ICD-10-CM | POA: Diagnosis not present

## 2017-08-24 DIAGNOSIS — L0390 Cellulitis, unspecified: Secondary | ICD-10-CM | POA: Diagnosis not present

## 2017-08-24 DIAGNOSIS — K219 Gastro-esophageal reflux disease without esophagitis: Secondary | ICD-10-CM | POA: Diagnosis not present

## 2017-08-24 DIAGNOSIS — G4700 Insomnia, unspecified: Secondary | ICD-10-CM | POA: Diagnosis not present

## 2017-08-24 DIAGNOSIS — H3530 Unspecified macular degeneration: Secondary | ICD-10-CM | POA: Diagnosis not present

## 2017-08-24 DIAGNOSIS — I83012 Varicose veins of right lower extremity with ulcer of calf: Secondary | ICD-10-CM | POA: Diagnosis not present

## 2017-08-24 DIAGNOSIS — I10 Essential (primary) hypertension: Secondary | ICD-10-CM | POA: Diagnosis not present

## 2017-08-24 DIAGNOSIS — Z8572 Personal history of non-Hodgkin lymphomas: Secondary | ICD-10-CM | POA: Diagnosis not present

## 2017-08-25 DIAGNOSIS — I739 Peripheral vascular disease, unspecified: Secondary | ICD-10-CM | POA: Diagnosis not present

## 2017-08-25 DIAGNOSIS — I83012 Varicose veins of right lower extremity with ulcer of calf: Secondary | ICD-10-CM | POA: Diagnosis not present

## 2017-08-25 DIAGNOSIS — H3530 Unspecified macular degeneration: Secondary | ICD-10-CM | POA: Diagnosis not present

## 2017-08-25 DIAGNOSIS — I10 Essential (primary) hypertension: Secondary | ICD-10-CM | POA: Diagnosis not present

## 2017-08-25 DIAGNOSIS — Z8572 Personal history of non-Hodgkin lymphomas: Secondary | ICD-10-CM | POA: Diagnosis not present

## 2017-08-25 DIAGNOSIS — L0390 Cellulitis, unspecified: Secondary | ICD-10-CM | POA: Diagnosis not present

## 2017-08-25 DIAGNOSIS — E039 Hypothyroidism, unspecified: Secondary | ICD-10-CM | POA: Diagnosis not present

## 2017-08-25 DIAGNOSIS — G4700 Insomnia, unspecified: Secondary | ICD-10-CM | POA: Diagnosis not present

## 2017-08-25 DIAGNOSIS — Z9181 History of falling: Secondary | ICD-10-CM | POA: Diagnosis not present

## 2017-08-25 DIAGNOSIS — L97212 Non-pressure chronic ulcer of right calf with fat layer exposed: Secondary | ICD-10-CM | POA: Diagnosis not present

## 2017-08-25 DIAGNOSIS — K219 Gastro-esophageal reflux disease without esophagitis: Secondary | ICD-10-CM | POA: Diagnosis not present

## 2017-08-28 ENCOUNTER — Telehealth: Payer: Self-pay | Admitting: Family Medicine

## 2017-08-28 DIAGNOSIS — E039 Hypothyroidism, unspecified: Secondary | ICD-10-CM | POA: Diagnosis not present

## 2017-08-28 DIAGNOSIS — G4700 Insomnia, unspecified: Secondary | ICD-10-CM | POA: Diagnosis not present

## 2017-08-28 DIAGNOSIS — Z9181 History of falling: Secondary | ICD-10-CM | POA: Diagnosis not present

## 2017-08-28 DIAGNOSIS — I10 Essential (primary) hypertension: Secondary | ICD-10-CM | POA: Diagnosis not present

## 2017-08-28 DIAGNOSIS — L97212 Non-pressure chronic ulcer of right calf with fat layer exposed: Secondary | ICD-10-CM | POA: Diagnosis not present

## 2017-08-28 DIAGNOSIS — L0390 Cellulitis, unspecified: Secondary | ICD-10-CM | POA: Diagnosis not present

## 2017-08-28 DIAGNOSIS — I83012 Varicose veins of right lower extremity with ulcer of calf: Secondary | ICD-10-CM | POA: Diagnosis not present

## 2017-08-28 DIAGNOSIS — Z8572 Personal history of non-Hodgkin lymphomas: Secondary | ICD-10-CM | POA: Diagnosis not present

## 2017-08-28 DIAGNOSIS — H3530 Unspecified macular degeneration: Secondary | ICD-10-CM | POA: Diagnosis not present

## 2017-08-28 DIAGNOSIS — K219 Gastro-esophageal reflux disease without esophagitis: Secondary | ICD-10-CM | POA: Diagnosis not present

## 2017-08-28 DIAGNOSIS — I739 Peripheral vascular disease, unspecified: Secondary | ICD-10-CM | POA: Diagnosis not present

## 2017-08-28 NOTE — Telephone Encounter (Signed)
Wound care nurse calling.  States wound on leg is doing very well. Is now 0.3cm x 0.3 cm with no depth.  Her concern is that the tape is now causing him issues.  Wants to leave wound open to air until Wednesday visit and reassess then.  I gave her the OK unless you have other recommendations, then we will call her back.

## 2017-08-29 DIAGNOSIS — I83012 Varicose veins of right lower extremity with ulcer of calf: Secondary | ICD-10-CM | POA: Diagnosis not present

## 2017-08-29 DIAGNOSIS — I10 Essential (primary) hypertension: Secondary | ICD-10-CM | POA: Diagnosis not present

## 2017-08-29 DIAGNOSIS — L0390 Cellulitis, unspecified: Secondary | ICD-10-CM | POA: Diagnosis not present

## 2017-08-29 DIAGNOSIS — Z8572 Personal history of non-Hodgkin lymphomas: Secondary | ICD-10-CM | POA: Diagnosis not present

## 2017-08-29 DIAGNOSIS — E039 Hypothyroidism, unspecified: Secondary | ICD-10-CM | POA: Diagnosis not present

## 2017-08-29 DIAGNOSIS — G4700 Insomnia, unspecified: Secondary | ICD-10-CM | POA: Diagnosis not present

## 2017-08-29 DIAGNOSIS — H3530 Unspecified macular degeneration: Secondary | ICD-10-CM | POA: Diagnosis not present

## 2017-08-29 DIAGNOSIS — K219 Gastro-esophageal reflux disease without esophagitis: Secondary | ICD-10-CM | POA: Diagnosis not present

## 2017-08-29 DIAGNOSIS — I739 Peripheral vascular disease, unspecified: Secondary | ICD-10-CM | POA: Diagnosis not present

## 2017-08-29 DIAGNOSIS — Z9181 History of falling: Secondary | ICD-10-CM | POA: Diagnosis not present

## 2017-08-29 DIAGNOSIS — L97212 Non-pressure chronic ulcer of right calf with fat layer exposed: Secondary | ICD-10-CM | POA: Diagnosis not present

## 2017-08-29 NOTE — Telephone Encounter (Signed)
ok 

## 2017-08-30 DIAGNOSIS — I83012 Varicose veins of right lower extremity with ulcer of calf: Secondary | ICD-10-CM | POA: Diagnosis not present

## 2017-08-30 DIAGNOSIS — K219 Gastro-esophageal reflux disease without esophagitis: Secondary | ICD-10-CM | POA: Diagnosis not present

## 2017-08-30 DIAGNOSIS — I739 Peripheral vascular disease, unspecified: Secondary | ICD-10-CM | POA: Diagnosis not present

## 2017-08-30 DIAGNOSIS — Z9181 History of falling: Secondary | ICD-10-CM | POA: Diagnosis not present

## 2017-08-30 DIAGNOSIS — I10 Essential (primary) hypertension: Secondary | ICD-10-CM | POA: Diagnosis not present

## 2017-08-30 DIAGNOSIS — G4700 Insomnia, unspecified: Secondary | ICD-10-CM | POA: Diagnosis not present

## 2017-08-30 DIAGNOSIS — L0390 Cellulitis, unspecified: Secondary | ICD-10-CM | POA: Diagnosis not present

## 2017-08-30 DIAGNOSIS — E039 Hypothyroidism, unspecified: Secondary | ICD-10-CM | POA: Diagnosis not present

## 2017-08-30 DIAGNOSIS — Z8572 Personal history of non-Hodgkin lymphomas: Secondary | ICD-10-CM | POA: Diagnosis not present

## 2017-08-30 DIAGNOSIS — L97212 Non-pressure chronic ulcer of right calf with fat layer exposed: Secondary | ICD-10-CM | POA: Diagnosis not present

## 2017-08-30 DIAGNOSIS — H3530 Unspecified macular degeneration: Secondary | ICD-10-CM | POA: Diagnosis not present

## 2017-08-31 ENCOUNTER — Other Ambulatory Visit: Payer: Self-pay | Admitting: Family Medicine

## 2017-08-31 DIAGNOSIS — H3530 Unspecified macular degeneration: Secondary | ICD-10-CM | POA: Diagnosis not present

## 2017-08-31 DIAGNOSIS — Z8572 Personal history of non-Hodgkin lymphomas: Secondary | ICD-10-CM | POA: Diagnosis not present

## 2017-08-31 DIAGNOSIS — K219 Gastro-esophageal reflux disease without esophagitis: Secondary | ICD-10-CM | POA: Diagnosis not present

## 2017-08-31 DIAGNOSIS — I83012 Varicose veins of right lower extremity with ulcer of calf: Secondary | ICD-10-CM | POA: Diagnosis not present

## 2017-08-31 DIAGNOSIS — I739 Peripheral vascular disease, unspecified: Secondary | ICD-10-CM | POA: Diagnosis not present

## 2017-08-31 DIAGNOSIS — Z9181 History of falling: Secondary | ICD-10-CM | POA: Diagnosis not present

## 2017-08-31 DIAGNOSIS — L97212 Non-pressure chronic ulcer of right calf with fat layer exposed: Secondary | ICD-10-CM | POA: Diagnosis not present

## 2017-08-31 DIAGNOSIS — I10 Essential (primary) hypertension: Secondary | ICD-10-CM | POA: Diagnosis not present

## 2017-08-31 DIAGNOSIS — L0390 Cellulitis, unspecified: Secondary | ICD-10-CM | POA: Diagnosis not present

## 2017-08-31 DIAGNOSIS — G4700 Insomnia, unspecified: Secondary | ICD-10-CM | POA: Diagnosis not present

## 2017-08-31 DIAGNOSIS — E039 Hypothyroidism, unspecified: Secondary | ICD-10-CM | POA: Diagnosis not present

## 2017-08-31 MED ORDER — LEVOTHYROXINE SODIUM 100 MCG PO TABS: 100.0000 ug | ORAL_TABLET | Freq: Every day | ORAL | 2 refills | Status: DC

## 2017-08-31 MED ORDER — MIRTAZAPINE 30 MG PO TABS: 30.0000 mg | ORAL_TABLET | Freq: Every day | ORAL | 3 refills | Status: DC

## 2017-08-31 NOTE — Telephone Encounter (Signed)
Last OV 08/04/2017 Ok to refill

## 2017-08-31 NOTE — Telephone Encounter (Signed)
Will fax to pharamcy

## 2017-08-31 NOTE — Telephone Encounter (Signed)
Pt needs refill on mirtazapine, and levothyroxine sent to [facility] mail order.

## 2017-08-31 NOTE — Telephone Encounter (Signed)
ok 

## 2017-09-01 DIAGNOSIS — H3530 Unspecified macular degeneration: Secondary | ICD-10-CM | POA: Diagnosis not present

## 2017-09-01 DIAGNOSIS — E039 Hypothyroidism, unspecified: Secondary | ICD-10-CM | POA: Diagnosis not present

## 2017-09-01 DIAGNOSIS — Z9181 History of falling: Secondary | ICD-10-CM | POA: Diagnosis not present

## 2017-09-01 DIAGNOSIS — L0390 Cellulitis, unspecified: Secondary | ICD-10-CM | POA: Diagnosis not present

## 2017-09-01 DIAGNOSIS — I83012 Varicose veins of right lower extremity with ulcer of calf: Secondary | ICD-10-CM | POA: Diagnosis not present

## 2017-09-01 DIAGNOSIS — Z8572 Personal history of non-Hodgkin lymphomas: Secondary | ICD-10-CM | POA: Diagnosis not present

## 2017-09-01 DIAGNOSIS — K219 Gastro-esophageal reflux disease without esophagitis: Secondary | ICD-10-CM | POA: Diagnosis not present

## 2017-09-01 DIAGNOSIS — L97212 Non-pressure chronic ulcer of right calf with fat layer exposed: Secondary | ICD-10-CM | POA: Diagnosis not present

## 2017-09-01 DIAGNOSIS — I739 Peripheral vascular disease, unspecified: Secondary | ICD-10-CM | POA: Diagnosis not present

## 2017-09-01 DIAGNOSIS — I10 Essential (primary) hypertension: Secondary | ICD-10-CM | POA: Diagnosis not present

## 2017-09-01 DIAGNOSIS — G4700 Insomnia, unspecified: Secondary | ICD-10-CM | POA: Diagnosis not present

## 2017-09-01 MED ORDER — MIRTAZAPINE 30 MG PO TABS: 30.0000 mg | ORAL_TABLET | Freq: Every day | ORAL | 3 refills | Status: DC

## 2017-09-04 ENCOUNTER — Telehealth: Payer: Self-pay | Admitting: Family Medicine

## 2017-09-04 DIAGNOSIS — I739 Peripheral vascular disease, unspecified: Secondary | ICD-10-CM | POA: Diagnosis not present

## 2017-09-04 DIAGNOSIS — L97212 Non-pressure chronic ulcer of right calf with fat layer exposed: Secondary | ICD-10-CM | POA: Diagnosis not present

## 2017-09-04 DIAGNOSIS — L0390 Cellulitis, unspecified: Secondary | ICD-10-CM | POA: Diagnosis not present

## 2017-09-04 DIAGNOSIS — K219 Gastro-esophageal reflux disease without esophagitis: Secondary | ICD-10-CM | POA: Diagnosis not present

## 2017-09-04 DIAGNOSIS — E039 Hypothyroidism, unspecified: Secondary | ICD-10-CM | POA: Diagnosis not present

## 2017-09-04 DIAGNOSIS — I10 Essential (primary) hypertension: Secondary | ICD-10-CM | POA: Diagnosis not present

## 2017-09-04 DIAGNOSIS — G4700 Insomnia, unspecified: Secondary | ICD-10-CM | POA: Diagnosis not present

## 2017-09-04 DIAGNOSIS — H3530 Unspecified macular degeneration: Secondary | ICD-10-CM | POA: Diagnosis not present

## 2017-09-04 DIAGNOSIS — I83012 Varicose veins of right lower extremity with ulcer of calf: Secondary | ICD-10-CM | POA: Diagnosis not present

## 2017-09-04 DIAGNOSIS — Z9181 History of falling: Secondary | ICD-10-CM | POA: Diagnosis not present

## 2017-09-04 DIAGNOSIS — Z8572 Personal history of non-Hodgkin lymphomas: Secondary | ICD-10-CM | POA: Diagnosis not present

## 2017-09-04 MED ORDER — CLONIDINE HCL 0.1 MG PO TABS: 0.1000 mg | ORAL_TABLET | Freq: Two times a day (BID) | ORAL | 0 refills | Status: DC

## 2017-09-04 NOTE — Telephone Encounter (Signed)
[place] nurse at house for wound care.  Calling to report pt's BP is 198/102 and 202/98.  Pt is resting, denies any other complaints.  Has taken his BP med for today.  Spoke with Dr Dennard Schaumann, as pt is asymptomatic will send order for Clonidine 0.1 mg tabs to take BID.  Need BP check at end of week.  Called [facility] nurse and told her provider recommendations.  Rx to [facility].  Nurse to see pt again Wed and Fri of this week and family has monitor at home. [facility] nurse will advise [place] of BP readings.

## 2017-09-05 DIAGNOSIS — I739 Peripheral vascular disease, unspecified: Secondary | ICD-10-CM | POA: Diagnosis not present

## 2017-09-05 DIAGNOSIS — G4700 Insomnia, unspecified: Secondary | ICD-10-CM | POA: Diagnosis not present

## 2017-09-05 DIAGNOSIS — L97212 Non-pressure chronic ulcer of right calf with fat layer exposed: Secondary | ICD-10-CM | POA: Diagnosis not present

## 2017-09-05 DIAGNOSIS — I10 Essential (primary) hypertension: Secondary | ICD-10-CM | POA: Diagnosis not present

## 2017-09-05 DIAGNOSIS — Z8572 Personal history of non-Hodgkin lymphomas: Secondary | ICD-10-CM | POA: Diagnosis not present

## 2017-09-05 DIAGNOSIS — I83012 Varicose veins of right lower extremity with ulcer of calf: Secondary | ICD-10-CM | POA: Diagnosis not present

## 2017-09-05 DIAGNOSIS — L0390 Cellulitis, unspecified: Secondary | ICD-10-CM | POA: Diagnosis not present

## 2017-09-05 DIAGNOSIS — Z9181 History of falling: Secondary | ICD-10-CM | POA: Diagnosis not present

## 2017-09-05 DIAGNOSIS — K219 Gastro-esophageal reflux disease without esophagitis: Secondary | ICD-10-CM | POA: Diagnosis not present

## 2017-09-05 DIAGNOSIS — E039 Hypothyroidism, unspecified: Secondary | ICD-10-CM | POA: Diagnosis not present

## 2017-09-05 DIAGNOSIS — H3530 Unspecified macular degeneration: Secondary | ICD-10-CM | POA: Diagnosis not present

## 2017-09-06 DIAGNOSIS — G4700 Insomnia, unspecified: Secondary | ICD-10-CM | POA: Diagnosis not present

## 2017-09-06 DIAGNOSIS — I83012 Varicose veins of right lower extremity with ulcer of calf: Secondary | ICD-10-CM | POA: Diagnosis not present

## 2017-09-06 DIAGNOSIS — L97212 Non-pressure chronic ulcer of right calf with fat layer exposed: Secondary | ICD-10-CM | POA: Diagnosis not present

## 2017-09-06 DIAGNOSIS — Z8572 Personal history of non-Hodgkin lymphomas: Secondary | ICD-10-CM | POA: Diagnosis not present

## 2017-09-06 DIAGNOSIS — K219 Gastro-esophageal reflux disease without esophagitis: Secondary | ICD-10-CM | POA: Diagnosis not present

## 2017-09-06 DIAGNOSIS — E039 Hypothyroidism, unspecified: Secondary | ICD-10-CM | POA: Diagnosis not present

## 2017-09-06 DIAGNOSIS — I10 Essential (primary) hypertension: Secondary | ICD-10-CM | POA: Diagnosis not present

## 2017-09-06 DIAGNOSIS — I739 Peripheral vascular disease, unspecified: Secondary | ICD-10-CM | POA: Diagnosis not present

## 2017-09-06 DIAGNOSIS — H3530 Unspecified macular degeneration: Secondary | ICD-10-CM | POA: Diagnosis not present

## 2017-09-06 DIAGNOSIS — L0390 Cellulitis, unspecified: Secondary | ICD-10-CM | POA: Diagnosis not present

## 2017-09-06 DIAGNOSIS — Z9181 History of falling: Secondary | ICD-10-CM | POA: Diagnosis not present

## 2017-09-08 DIAGNOSIS — Z9181 History of falling: Secondary | ICD-10-CM | POA: Diagnosis not present

## 2017-09-08 DIAGNOSIS — I83012 Varicose veins of right lower extremity with ulcer of calf: Secondary | ICD-10-CM | POA: Diagnosis not present

## 2017-09-08 DIAGNOSIS — K219 Gastro-esophageal reflux disease without esophagitis: Secondary | ICD-10-CM | POA: Diagnosis not present

## 2017-09-08 DIAGNOSIS — H3530 Unspecified macular degeneration: Secondary | ICD-10-CM | POA: Diagnosis not present

## 2017-09-08 DIAGNOSIS — E039 Hypothyroidism, unspecified: Secondary | ICD-10-CM | POA: Diagnosis not present

## 2017-09-08 DIAGNOSIS — Z8572 Personal history of non-Hodgkin lymphomas: Secondary | ICD-10-CM | POA: Diagnosis not present

## 2017-09-08 DIAGNOSIS — G4700 Insomnia, unspecified: Secondary | ICD-10-CM | POA: Diagnosis not present

## 2017-09-08 DIAGNOSIS — I739 Peripheral vascular disease, unspecified: Secondary | ICD-10-CM | POA: Diagnosis not present

## 2017-09-08 DIAGNOSIS — L0390 Cellulitis, unspecified: Secondary | ICD-10-CM | POA: Diagnosis not present

## 2017-09-08 DIAGNOSIS — L97212 Non-pressure chronic ulcer of right calf with fat layer exposed: Secondary | ICD-10-CM | POA: Diagnosis not present

## 2017-09-08 DIAGNOSIS — I10 Essential (primary) hypertension: Secondary | ICD-10-CM | POA: Diagnosis not present

## 2017-09-11 ENCOUNTER — Encounter: Payer: Self-pay | Admitting: Family Medicine

## 2017-09-11 ENCOUNTER — Ambulatory Visit (INDEPENDENT_AMBULATORY_CARE_PROVIDER_SITE_OTHER): Payer: Medicare Other | Admitting: Family Medicine

## 2017-09-11 VITALS — BP 160/88 | HR 76 | Temp 97.6°F | Resp 16

## 2017-09-11 DIAGNOSIS — J189 Pneumonia, unspecified organism: Secondary | ICD-10-CM

## 2017-09-11 DIAGNOSIS — J181 Lobar pneumonia, unspecified organism: Secondary | ICD-10-CM | POA: Diagnosis not present

## 2017-09-11 DIAGNOSIS — I10 Essential (primary) hypertension: Secondary | ICD-10-CM | POA: Diagnosis not present

## 2017-09-11 MED ORDER — AMLODIPINE BESYLATE 5 MG PO TABS: 5.0000 mg | ORAL_TABLET | Freq: Every day | ORAL | 3 refills | Status: DC

## 2017-09-11 MED ORDER — LEVOFLOXACIN 250 MG PO TABS: 250.0000 mg | ORAL_TABLET | Freq: Every day | ORAL | 0 refills | Status: DC

## 2017-09-11 NOTE — Progress Notes (Signed)
Subjective:    Patient ID: [name], male    DOB: [birth year], 83 y.o.   MRN: [id number]  HPI The patient is a 83 year old white male who is here today to follow-up on hypertension. Last week, home health care nursing called with elevated blood pressures between 381 and 017 systolic. At that time, the decision was made to start him on clonidine 0.1 mg by mouth twice a day.However, his blood pressure has been fluctuating wildly on the medication. As soon as he takes the medication, his blood pressure will plummet His systolic blood pressure will drop to 90 on occasion. At times it is too low to even give the medication. At other times, his systolic blood pressure will again be 180-190. He also seems to have an orthostatic component to his blood pressure. When he is lying down it is elevated. When he stands it drops . Nurses were concerned about dehydration. However on exam today, mucous membranes are moist, skin turgor is normal, there is a normal tear film in his eye.  He also reports a URI with cough with increasing shortness of breath.  On exam today, the patient has left posterior basilar crackles.  He denies fever. Past Medical History:  Diagnosis Date  . Acid reflux   . Cancer ([place]) 09/05/13   left neck-non-hodgkins lymphoma  . Hypertension    requires diuretic  . Hypothyroidism   . Malaria    while in the service  . Peripheral vascular disease ([place])    slow wound healing on legs   Past Surgical History:  Procedure Laterality Date  . AMPUTATION Left 06/25/2015   Procedure: LEFT  HALLUX AMPUTATION,;  Surgeon: Wylene Simmer, MD;  Location: [facility];  Service: Orthopedics;  Laterality: Left;  LOCAL/MAC  . MASS BIOPSY Left 09/05/2013   Procedure: EXCISIONAL BIOPSY OF LEFT NECK MASS;  Surgeon: Jerrell Belfast, MD;  Location: [facility];  Service: ENT;  Laterality: Left;  . TONSILLECTOMY    . YAG LASER APPLICATION Right 04/06/2584   Procedure: YAG LASER APPLICATION;  Surgeon:  [name], MD;  Location: [facility];  Service: Ophthalmology;  Laterality: Right;   Current Outpatient Prescriptions on File Prior to Visit  Medication Sig Dispense Refill  . aspirin EC 81 MG tablet Take 1 tablet (81 mg total) by mouth daily.    . cephALEXin (KEFLEX) 500 MG capsule Take 1 capsule (500 mg total) by mouth 3 (three) times daily. 21 capsule 0  . cloNIDine (CATAPRES) 0.1 MG tablet Take 1 tablet (0.1 mg total) by mouth 2 (two) times daily. 60 tablet 0  . enalapril (VASOTEC) 20 MG tablet TAKE 1 TABLET BY MOUTH  EVERY DAY 90 tablet 3  . furosemide (LASIX) 20 MG tablet TAKE 1 TABLET BY MOUTH  EVERY DAY 90 tablet 1  . gabapentin (NEURONTIN) 100 MG capsule Take 2 capsules every morning (Patient taking differently: 100 mg daily after lunch. ) 180 capsule 4  . gabapentin (NEURONTIN) 300 MG capsule Take 1 capsule (300 mg total) by mouth at bedtime. (Patient taking differently: Take 300 mg by mouth 2 (two) times daily. ) 90 capsule 4  . levothyroxine (SYNTHROID, LEVOTHROID) 100 MCG tablet Take 1 tablet (100 mcg total) by mouth daily. 30 tablet 2  . mirtazapine (REMERON) 30 MG tablet Take 1 tablet (30 mg total) by mouth at bedtime. 30 tablet 3  . mometasone (ELOCON) 0.1 % ointment APPLY TO AFFECTED AREA EVERY DAY  0  . NONFORMULARY OR COMPOUNDED ITEM  [facility]:  Peripheral Neuropathy cream - Bupivacaine 1%, Doxepin 3%, Gabapentin 6%, Pentoxifylline 3%, Topiramate 1%, apply 1-2 grams to affected area 3-4 times daily. 120 each 11  . traMADol (ULTRAM) 50 MG tablet TAKE 1 TABLET EVERY 8 HOURS AS NEEDED FOR PAIN 30 tablet 1  . triamcinolone cream (KENALOG) 0.1 % APPLY TO AFFECTED AREA TWICE A DAY FOR 2-4 WEEKS AS NEEDED FOR FLARES  1  . vitamin B-12 (CYANOCOBALAMIN) 1000 MCG tablet Take 1 tablet (1,000 mcg total) by mouth daily. 30 tablet 5  . [DISCONTINUED] famotidine (PEPCID) 20 MG tablet Take 20 mg by mouth 2 (two) times daily.     No current facility-administered medications on file prior  to visit.    Allergies  Allergen Reactions  . Codeine Nausea And Vomiting  . Hydrocodone Nausea And Vomiting  . Percocet [Oxycodone-Acetaminophen] Nausea And Vomiting   Social History   Social History  . Marital status: Married    Spouse name: N/A  . Number of children: 4  . Years of education: N/A   Occupational History  . retired    Social History Main Topics  . Smoking status: Former Smoker    Types: Cigars  . Smokeless tobacco: Former Systems developer    Quit date: 10/05/1979  . Alcohol use No  . Drug use: No  . Sexual activity: Not on file   Other Topics Concern  . Not on file   Social History Narrative  . No narrative on file      Review of Systems  All other systems reviewed and are negative.      Objective:   Physical Exam  Constitutional: He appears well-developed and well-nourished. No distress.  HENT:  Right Ear: External ear normal.  Left Ear: External ear normal.  Nose: Nose normal.  Mouth/Throat: Oropharynx is clear and moist. No oropharyngeal exudate.  Neck: No JVD present.  Cardiovascular: Normal rate, regular rhythm and normal heart sounds.   No murmur heard. Pulmonary/Chest: Effort normal. No respiratory distress. He has no wheezes. He has rales.  Abdominal: Soft. Bowel sounds are normal.  Musculoskeletal: He exhibits no edema.  Lymphadenopathy:    He has no cervical adenopathy.  Skin: He is not diaphoretic.  Vitals reviewed.         Assessment & Plan:  Community acquired pneumonia of left lower lobe of lung ([place]) - Plan: levofloxacin (LEVAQUIN) 250 MG tablet, amLODipine (NORVASC) 5 MG tablet  Benign essential HTN  Discontinue clonidine as this seems to betoo aggressive Replace with amlodipine 5 mg a day and recheck blood pressure in one week. Start Levaquin 250 mg by mouth daily for 7 days for community-acquired pneumonia. We discussed possibly going to the hospital given his age. However I'm concerned that he would likely be exposed to  some other type of infection in the hospital and recommended outpatient treatment given normal spo2 and afebrile status.

## 2017-09-12 DIAGNOSIS — G4700 Insomnia, unspecified: Secondary | ICD-10-CM | POA: Diagnosis not present

## 2017-09-12 DIAGNOSIS — I739 Peripheral vascular disease, unspecified: Secondary | ICD-10-CM | POA: Diagnosis not present

## 2017-09-12 DIAGNOSIS — I10 Essential (primary) hypertension: Secondary | ICD-10-CM | POA: Diagnosis not present

## 2017-09-12 DIAGNOSIS — K219 Gastro-esophageal reflux disease without esophagitis: Secondary | ICD-10-CM | POA: Diagnosis not present

## 2017-09-12 DIAGNOSIS — H3530 Unspecified macular degeneration: Secondary | ICD-10-CM | POA: Diagnosis not present

## 2017-09-12 DIAGNOSIS — L97212 Non-pressure chronic ulcer of right calf with fat layer exposed: Secondary | ICD-10-CM | POA: Diagnosis not present

## 2017-09-12 DIAGNOSIS — Z9181 History of falling: Secondary | ICD-10-CM | POA: Diagnosis not present

## 2017-09-12 DIAGNOSIS — I83012 Varicose veins of right lower extremity with ulcer of calf: Secondary | ICD-10-CM | POA: Diagnosis not present

## 2017-09-12 DIAGNOSIS — Z8572 Personal history of non-Hodgkin lymphomas: Secondary | ICD-10-CM | POA: Diagnosis not present

## 2017-09-12 DIAGNOSIS — L0390 Cellulitis, unspecified: Secondary | ICD-10-CM | POA: Diagnosis not present

## 2017-09-12 DIAGNOSIS — E039 Hypothyroidism, unspecified: Secondary | ICD-10-CM | POA: Diagnosis not present

## 2017-09-13 DIAGNOSIS — G4700 Insomnia, unspecified: Secondary | ICD-10-CM | POA: Diagnosis not present

## 2017-09-13 DIAGNOSIS — I83012 Varicose veins of right lower extremity with ulcer of calf: Secondary | ICD-10-CM | POA: Diagnosis not present

## 2017-09-13 DIAGNOSIS — I10 Essential (primary) hypertension: Secondary | ICD-10-CM | POA: Diagnosis not present

## 2017-09-13 DIAGNOSIS — H3530 Unspecified macular degeneration: Secondary | ICD-10-CM | POA: Diagnosis not present

## 2017-09-13 DIAGNOSIS — L97212 Non-pressure chronic ulcer of right calf with fat layer exposed: Secondary | ICD-10-CM | POA: Diagnosis not present

## 2017-09-13 DIAGNOSIS — Z8572 Personal history of non-Hodgkin lymphomas: Secondary | ICD-10-CM | POA: Diagnosis not present

## 2017-09-13 DIAGNOSIS — E039 Hypothyroidism, unspecified: Secondary | ICD-10-CM | POA: Diagnosis not present

## 2017-09-13 DIAGNOSIS — K219 Gastro-esophageal reflux disease without esophagitis: Secondary | ICD-10-CM | POA: Diagnosis not present

## 2017-09-13 DIAGNOSIS — I739 Peripheral vascular disease, unspecified: Secondary | ICD-10-CM | POA: Diagnosis not present

## 2017-09-13 DIAGNOSIS — L0390 Cellulitis, unspecified: Secondary | ICD-10-CM | POA: Diagnosis not present

## 2017-09-13 DIAGNOSIS — Z9181 History of falling: Secondary | ICD-10-CM | POA: Diagnosis not present

## 2017-09-15 DIAGNOSIS — L0390 Cellulitis, unspecified: Secondary | ICD-10-CM | POA: Diagnosis not present

## 2017-09-15 DIAGNOSIS — Z9181 History of falling: Secondary | ICD-10-CM | POA: Diagnosis not present

## 2017-09-15 DIAGNOSIS — I739 Peripheral vascular disease, unspecified: Secondary | ICD-10-CM | POA: Diagnosis not present

## 2017-09-15 DIAGNOSIS — I10 Essential (primary) hypertension: Secondary | ICD-10-CM | POA: Diagnosis not present

## 2017-09-15 DIAGNOSIS — Z8572 Personal history of non-Hodgkin lymphomas: Secondary | ICD-10-CM | POA: Diagnosis not present

## 2017-09-15 DIAGNOSIS — I83012 Varicose veins of right lower extremity with ulcer of calf: Secondary | ICD-10-CM | POA: Diagnosis not present

## 2017-09-15 DIAGNOSIS — H3530 Unspecified macular degeneration: Secondary | ICD-10-CM | POA: Diagnosis not present

## 2017-09-15 DIAGNOSIS — K219 Gastro-esophageal reflux disease without esophagitis: Secondary | ICD-10-CM | POA: Diagnosis not present

## 2017-09-15 DIAGNOSIS — L97212 Non-pressure chronic ulcer of right calf with fat layer exposed: Secondary | ICD-10-CM | POA: Diagnosis not present

## 2017-09-15 DIAGNOSIS — E039 Hypothyroidism, unspecified: Secondary | ICD-10-CM | POA: Diagnosis not present

## 2017-09-15 DIAGNOSIS — G4700 Insomnia, unspecified: Secondary | ICD-10-CM | POA: Diagnosis not present

## 2017-09-19 ENCOUNTER — Other Ambulatory Visit: Payer: Self-pay | Admitting: Family Medicine

## 2017-09-27 DIAGNOSIS — K219 Gastro-esophageal reflux disease without esophagitis: Secondary | ICD-10-CM | POA: Diagnosis not present

## 2017-09-27 DIAGNOSIS — G4700 Insomnia, unspecified: Secondary | ICD-10-CM | POA: Diagnosis not present

## 2017-09-27 DIAGNOSIS — L0390 Cellulitis, unspecified: Secondary | ICD-10-CM | POA: Diagnosis not present

## 2017-09-27 DIAGNOSIS — I10 Essential (primary) hypertension: Secondary | ICD-10-CM | POA: Diagnosis not present

## 2017-09-27 DIAGNOSIS — Z8572 Personal history of non-Hodgkin lymphomas: Secondary | ICD-10-CM | POA: Diagnosis not present

## 2017-09-27 DIAGNOSIS — L97212 Non-pressure chronic ulcer of right calf with fat layer exposed: Secondary | ICD-10-CM | POA: Diagnosis not present

## 2017-09-27 DIAGNOSIS — Z9181 History of falling: Secondary | ICD-10-CM | POA: Diagnosis not present

## 2017-09-27 DIAGNOSIS — H3530 Unspecified macular degeneration: Secondary | ICD-10-CM | POA: Diagnosis not present

## 2017-09-27 DIAGNOSIS — I739 Peripheral vascular disease, unspecified: Secondary | ICD-10-CM | POA: Diagnosis not present

## 2017-09-27 DIAGNOSIS — I83012 Varicose veins of right lower extremity with ulcer of calf: Secondary | ICD-10-CM | POA: Diagnosis not present

## 2017-09-27 DIAGNOSIS — E039 Hypothyroidism, unspecified: Secondary | ICD-10-CM | POA: Diagnosis not present

## 2017-10-01 ENCOUNTER — Other Ambulatory Visit: Payer: Self-pay | Admitting: Family Medicine

## 2017-10-02 DIAGNOSIS — I83012 Varicose veins of right lower extremity with ulcer of calf: Secondary | ICD-10-CM | POA: Diagnosis not present

## 2017-10-02 DIAGNOSIS — L0390 Cellulitis, unspecified: Secondary | ICD-10-CM | POA: Diagnosis not present

## 2017-10-02 DIAGNOSIS — I10 Essential (primary) hypertension: Secondary | ICD-10-CM | POA: Diagnosis not present

## 2017-10-02 DIAGNOSIS — I739 Peripheral vascular disease, unspecified: Secondary | ICD-10-CM | POA: Diagnosis not present

## 2017-10-02 DIAGNOSIS — Z9181 History of falling: Secondary | ICD-10-CM | POA: Diagnosis not present

## 2017-10-02 DIAGNOSIS — E039 Hypothyroidism, unspecified: Secondary | ICD-10-CM | POA: Diagnosis not present

## 2017-10-02 DIAGNOSIS — K219 Gastro-esophageal reflux disease without esophagitis: Secondary | ICD-10-CM | POA: Diagnosis not present

## 2017-10-02 DIAGNOSIS — Z8572 Personal history of non-Hodgkin lymphomas: Secondary | ICD-10-CM | POA: Diagnosis not present

## 2017-10-02 DIAGNOSIS — G4700 Insomnia, unspecified: Secondary | ICD-10-CM | POA: Diagnosis not present

## 2017-10-02 DIAGNOSIS — H3530 Unspecified macular degeneration: Secondary | ICD-10-CM | POA: Diagnosis not present

## 2017-10-02 DIAGNOSIS — L97212 Non-pressure chronic ulcer of right calf with fat layer exposed: Secondary | ICD-10-CM | POA: Diagnosis not present

## 2017-10-04 DIAGNOSIS — M79604 Pain in right leg: Secondary | ICD-10-CM | POA: Diagnosis not present

## 2017-10-04 DIAGNOSIS — Z8572 Personal history of non-Hodgkin lymphomas: Secondary | ICD-10-CM | POA: Diagnosis not present

## 2017-10-04 DIAGNOSIS — G4700 Insomnia, unspecified: Secondary | ICD-10-CM | POA: Diagnosis not present

## 2017-10-04 DIAGNOSIS — I10 Essential (primary) hypertension: Secondary | ICD-10-CM | POA: Diagnosis not present

## 2017-10-04 DIAGNOSIS — I739 Peripheral vascular disease, unspecified: Secondary | ICD-10-CM | POA: Diagnosis not present

## 2017-10-04 DIAGNOSIS — M6281 Muscle weakness (generalized): Secondary | ICD-10-CM | POA: Diagnosis not present

## 2017-10-04 DIAGNOSIS — Z9181 History of falling: Secondary | ICD-10-CM | POA: Diagnosis not present

## 2017-10-04 DIAGNOSIS — E039 Hypothyroidism, unspecified: Secondary | ICD-10-CM | POA: Diagnosis not present

## 2017-10-04 DIAGNOSIS — H3530 Unspecified macular degeneration: Secondary | ICD-10-CM | POA: Diagnosis not present

## 2017-10-04 DIAGNOSIS — K219 Gastro-esophageal reflux disease without esophagitis: Secondary | ICD-10-CM | POA: Diagnosis not present

## 2017-10-04 DIAGNOSIS — Z7982 Long term (current) use of aspirin: Secondary | ICD-10-CM | POA: Diagnosis not present

## 2017-10-05 DIAGNOSIS — H353211 Exudative age-related macular degeneration, right eye, with active choroidal neovascularization: Secondary | ICD-10-CM | POA: Diagnosis not present

## 2017-10-05 DIAGNOSIS — H43813 Vitreous degeneration, bilateral: Secondary | ICD-10-CM | POA: Diagnosis not present

## 2017-10-05 DIAGNOSIS — H353223 Exudative age-related macular degeneration, left eye, with inactive scar: Secondary | ICD-10-CM | POA: Diagnosis not present

## 2017-10-11 DIAGNOSIS — H3530 Unspecified macular degeneration: Secondary | ICD-10-CM | POA: Diagnosis not present

## 2017-10-11 DIAGNOSIS — I739 Peripheral vascular disease, unspecified: Secondary | ICD-10-CM | POA: Diagnosis not present

## 2017-10-11 DIAGNOSIS — M6281 Muscle weakness (generalized): Secondary | ICD-10-CM | POA: Diagnosis not present

## 2017-10-11 DIAGNOSIS — Z9181 History of falling: Secondary | ICD-10-CM | POA: Diagnosis not present

## 2017-10-11 DIAGNOSIS — E039 Hypothyroidism, unspecified: Secondary | ICD-10-CM | POA: Diagnosis not present

## 2017-10-11 DIAGNOSIS — I10 Essential (primary) hypertension: Secondary | ICD-10-CM | POA: Diagnosis not present

## 2017-10-11 DIAGNOSIS — M79604 Pain in right leg: Secondary | ICD-10-CM | POA: Diagnosis not present

## 2017-10-11 DIAGNOSIS — K219 Gastro-esophageal reflux disease without esophagitis: Secondary | ICD-10-CM | POA: Diagnosis not present

## 2017-10-11 DIAGNOSIS — G4700 Insomnia, unspecified: Secondary | ICD-10-CM | POA: Diagnosis not present

## 2017-10-11 DIAGNOSIS — Z7982 Long term (current) use of aspirin: Secondary | ICD-10-CM | POA: Diagnosis not present

## 2017-10-11 DIAGNOSIS — Z8572 Personal history of non-Hodgkin lymphomas: Secondary | ICD-10-CM | POA: Diagnosis not present

## 2017-10-13 DIAGNOSIS — G4700 Insomnia, unspecified: Secondary | ICD-10-CM | POA: Diagnosis not present

## 2017-10-13 DIAGNOSIS — K219 Gastro-esophageal reflux disease without esophagitis: Secondary | ICD-10-CM | POA: Diagnosis not present

## 2017-10-13 DIAGNOSIS — M79604 Pain in right leg: Secondary | ICD-10-CM | POA: Diagnosis not present

## 2017-10-13 DIAGNOSIS — H3530 Unspecified macular degeneration: Secondary | ICD-10-CM | POA: Diagnosis not present

## 2017-10-13 DIAGNOSIS — Z7982 Long term (current) use of aspirin: Secondary | ICD-10-CM | POA: Diagnosis not present

## 2017-10-13 DIAGNOSIS — Z9181 History of falling: Secondary | ICD-10-CM | POA: Diagnosis not present

## 2017-10-13 DIAGNOSIS — M6281 Muscle weakness (generalized): Secondary | ICD-10-CM | POA: Diagnosis not present

## 2017-10-13 DIAGNOSIS — E039 Hypothyroidism, unspecified: Secondary | ICD-10-CM | POA: Diagnosis not present

## 2017-10-13 DIAGNOSIS — I739 Peripheral vascular disease, unspecified: Secondary | ICD-10-CM | POA: Diagnosis not present

## 2017-10-13 DIAGNOSIS — Z8572 Personal history of non-Hodgkin lymphomas: Secondary | ICD-10-CM | POA: Diagnosis not present

## 2017-10-13 DIAGNOSIS — I10 Essential (primary) hypertension: Secondary | ICD-10-CM | POA: Diagnosis not present

## 2017-10-17 DIAGNOSIS — E039 Hypothyroidism, unspecified: Secondary | ICD-10-CM | POA: Diagnosis not present

## 2017-10-17 DIAGNOSIS — H3530 Unspecified macular degeneration: Secondary | ICD-10-CM | POA: Diagnosis not present

## 2017-10-17 DIAGNOSIS — Z9181 History of falling: Secondary | ICD-10-CM | POA: Diagnosis not present

## 2017-10-17 DIAGNOSIS — M79604 Pain in right leg: Secondary | ICD-10-CM | POA: Diagnosis not present

## 2017-10-17 DIAGNOSIS — Z7982 Long term (current) use of aspirin: Secondary | ICD-10-CM | POA: Diagnosis not present

## 2017-10-17 DIAGNOSIS — I739 Peripheral vascular disease, unspecified: Secondary | ICD-10-CM | POA: Diagnosis not present

## 2017-10-17 DIAGNOSIS — I10 Essential (primary) hypertension: Secondary | ICD-10-CM | POA: Diagnosis not present

## 2017-10-17 DIAGNOSIS — Z8572 Personal history of non-Hodgkin lymphomas: Secondary | ICD-10-CM | POA: Diagnosis not present

## 2017-10-17 DIAGNOSIS — M6281 Muscle weakness (generalized): Secondary | ICD-10-CM | POA: Diagnosis not present

## 2017-10-17 DIAGNOSIS — G4700 Insomnia, unspecified: Secondary | ICD-10-CM | POA: Diagnosis not present

## 2017-10-17 DIAGNOSIS — K219 Gastro-esophageal reflux disease without esophagitis: Secondary | ICD-10-CM | POA: Diagnosis not present

## 2017-10-20 DIAGNOSIS — E039 Hypothyroidism, unspecified: Secondary | ICD-10-CM | POA: Diagnosis not present

## 2017-10-20 DIAGNOSIS — G4700 Insomnia, unspecified: Secondary | ICD-10-CM | POA: Diagnosis not present

## 2017-10-20 DIAGNOSIS — I10 Essential (primary) hypertension: Secondary | ICD-10-CM | POA: Diagnosis not present

## 2017-10-20 DIAGNOSIS — Z8572 Personal history of non-Hodgkin lymphomas: Secondary | ICD-10-CM | POA: Diagnosis not present

## 2017-10-20 DIAGNOSIS — K219 Gastro-esophageal reflux disease without esophagitis: Secondary | ICD-10-CM | POA: Diagnosis not present

## 2017-10-20 DIAGNOSIS — Z9181 History of falling: Secondary | ICD-10-CM | POA: Diagnosis not present

## 2017-10-20 DIAGNOSIS — M6281 Muscle weakness (generalized): Secondary | ICD-10-CM | POA: Diagnosis not present

## 2017-10-20 DIAGNOSIS — M79604 Pain in right leg: Secondary | ICD-10-CM | POA: Diagnosis not present

## 2017-10-20 DIAGNOSIS — H3530 Unspecified macular degeneration: Secondary | ICD-10-CM | POA: Diagnosis not present

## 2017-10-20 DIAGNOSIS — I739 Peripheral vascular disease, unspecified: Secondary | ICD-10-CM | POA: Diagnosis not present

## 2017-10-20 DIAGNOSIS — Z7982 Long term (current) use of aspirin: Secondary | ICD-10-CM | POA: Diagnosis not present

## 2017-10-24 DIAGNOSIS — Z9181 History of falling: Secondary | ICD-10-CM | POA: Diagnosis not present

## 2017-10-24 DIAGNOSIS — M79604 Pain in right leg: Secondary | ICD-10-CM | POA: Diagnosis not present

## 2017-10-24 DIAGNOSIS — G4700 Insomnia, unspecified: Secondary | ICD-10-CM | POA: Diagnosis not present

## 2017-10-24 DIAGNOSIS — E039 Hypothyroidism, unspecified: Secondary | ICD-10-CM | POA: Diagnosis not present

## 2017-10-24 DIAGNOSIS — I739 Peripheral vascular disease, unspecified: Secondary | ICD-10-CM | POA: Diagnosis not present

## 2017-10-24 DIAGNOSIS — Z8572 Personal history of non-Hodgkin lymphomas: Secondary | ICD-10-CM | POA: Diagnosis not present

## 2017-10-24 DIAGNOSIS — I10 Essential (primary) hypertension: Secondary | ICD-10-CM | POA: Diagnosis not present

## 2017-10-24 DIAGNOSIS — M6281 Muscle weakness (generalized): Secondary | ICD-10-CM | POA: Diagnosis not present

## 2017-10-24 DIAGNOSIS — Z7982 Long term (current) use of aspirin: Secondary | ICD-10-CM | POA: Diagnosis not present

## 2017-10-24 DIAGNOSIS — K219 Gastro-esophageal reflux disease without esophagitis: Secondary | ICD-10-CM | POA: Diagnosis not present

## 2017-10-24 DIAGNOSIS — H3530 Unspecified macular degeneration: Secondary | ICD-10-CM | POA: Diagnosis not present

## 2017-10-27 DIAGNOSIS — Z8572 Personal history of non-Hodgkin lymphomas: Secondary | ICD-10-CM | POA: Diagnosis not present

## 2017-10-27 DIAGNOSIS — H3530 Unspecified macular degeneration: Secondary | ICD-10-CM | POA: Diagnosis not present

## 2017-10-27 DIAGNOSIS — Z9181 History of falling: Secondary | ICD-10-CM | POA: Diagnosis not present

## 2017-10-27 DIAGNOSIS — G4700 Insomnia, unspecified: Secondary | ICD-10-CM | POA: Diagnosis not present

## 2017-10-27 DIAGNOSIS — Z7982 Long term (current) use of aspirin: Secondary | ICD-10-CM | POA: Diagnosis not present

## 2017-10-27 DIAGNOSIS — M6281 Muscle weakness (generalized): Secondary | ICD-10-CM | POA: Diagnosis not present

## 2017-10-27 DIAGNOSIS — K219 Gastro-esophageal reflux disease without esophagitis: Secondary | ICD-10-CM | POA: Diagnosis not present

## 2017-10-27 DIAGNOSIS — I10 Essential (primary) hypertension: Secondary | ICD-10-CM | POA: Diagnosis not present

## 2017-10-27 DIAGNOSIS — I739 Peripheral vascular disease, unspecified: Secondary | ICD-10-CM | POA: Diagnosis not present

## 2017-10-27 DIAGNOSIS — E039 Hypothyroidism, unspecified: Secondary | ICD-10-CM | POA: Diagnosis not present

## 2017-10-27 DIAGNOSIS — M79604 Pain in right leg: Secondary | ICD-10-CM | POA: Diagnosis not present

## 2017-11-06 ENCOUNTER — Other Ambulatory Visit: Payer: Self-pay | Admitting: Podiatry

## 2017-11-27 ENCOUNTER — Ambulatory Visit (INDEPENDENT_AMBULATORY_CARE_PROVIDER_SITE_OTHER): Payer: Medicare Other | Admitting: Family Medicine

## 2017-11-27 VITALS — BP 130/72 | HR 68 | Temp 98.1°F | Resp 16 | Ht 73.0 in | Wt 208.0 lb

## 2017-11-27 DIAGNOSIS — L2081 Atopic neurodermatitis: Secondary | ICD-10-CM

## 2017-11-27 MED ORDER — BETAMETHASONE DIPROPIONATE 0.05 % EX CREA
TOPICAL_CREAM | Freq: Two times a day (BID) | CUTANEOUS | 1 refills | Status: DC
Start: 2017-11-27 — End: 2018-11-06

## 2017-11-27 NOTE — Progress Notes (Signed)
Subjective:    Patient ID: [name], male    DOB: 03/20/1919, 83 y.o.   MRN: [id number]  HPI Patient is here today with his daughter and son. He has a rash that comes and goes all over his body. Some days he can be on the side of his neck. Other days it can be on the sides of his arm. Other days it can be on his medial thighs. The rash is erythematous and papular and extremely itchy. He also reports dry cracked fissured skin all over his body but particularly in areas where he develops a rash. He also complains of intense burning and itching at the sites of the rash. His daughter states that sometimes he will complain of burning and itching and areas of the skin there is no visible rash. Today on his exam there is no significant rash seen. He does have dry flaky skin on all of his extremities. There are chronic venous stasis changes on his shins bilaterally distal to his knees Past Medical History:  Diagnosis Date  . Acid reflux   . Cancer ([place]) 09/05/13   left neck-non-hodgkins lymphoma  . Hypertension    requires diuretic  . Hypothyroidism   . Malaria    while in the service  . Peripheral vascular disease ([place])    slow wound healing on legs   Past Surgical History:  Procedure Laterality Date  . AMPUTATION Left 06/25/2015   Procedure: LEFT  HALLUX AMPUTATION,;  Surgeon: Wylene Simmer, MD;  Location: [facility];  Service: Orthopedics;  Laterality: Left;  LOCAL/MAC  . MASS BIOPSY Left 09/05/2013   Procedure: EXCISIONAL BIOPSY OF LEFT NECK MASS;  Surgeon: Jerrell Belfast, MD;  Location: [facility];  Service: ENT;  Laterality: Left;  . TONSILLECTOMY    . YAG LASER APPLICATION Right 12/16/1476   Procedure: YAG LASER APPLICATION;  Surgeon: [name], MD;  Location: [facility];  Service: Ophthalmology;  Laterality: Right;   Current Outpatient Medications on File Prior to Visit  Medication Sig Dispense Refill  . amLODipine (NORVASC) 5 MG tablet Take 1 tablet (5 mg total) by mouth  daily. 90 tablet 3  . aspirin EC 81 MG tablet Take 1 tablet (81 mg total) by mouth daily.    . cloNIDine (CATAPRES) 0.1 MG tablet TAKE 1 TABLET BY MOUTH TWICE A DAY 60 tablet 3  . enalapril (VASOTEC) 20 MG tablet TAKE 1 TABLET BY MOUTH  EVERY DAY 90 tablet 3  . furosemide (LASIX) 20 MG tablet TAKE 1 TABLET BY MOUTH  EVERY DAY 90 tablet 1  . gabapentin (NEURONTIN) 100 MG capsule TAKE 2 CAPSULES EVERY  MORNING 180 capsule 4  . gabapentin (NEURONTIN) 300 MG capsule TAKE 1 CAPSULE BY MOUTH AT  BEDTIME 90 capsule 4  . levothyroxine (SYNTHROID, LEVOTHROID) 100 MCG tablet Take 1 tablet (100 mcg total) by mouth daily. 30 tablet 2  . NONFORMULARY OR COMPOUNDED ITEM [facility]:  Peripheral Neuropathy cream - Bupivacaine 1%, Doxepin 3%, Gabapentin 6%, Pentoxifylline 3%, Topiramate 1%, apply 1-2 grams to affected area 3-4 times daily. 120 each 11  . traMADol (ULTRAM) 50 MG tablet TAKE 1 TABLET EVERY 8 HOURS AS NEEDED FOR PAIN 30 tablet 1  . vitamin B-12 (CYANOCOBALAMIN) 1000 MCG tablet Take 1 tablet (1,000 mcg total) by mouth daily. 30 tablet 5  . mometasone (ELOCON) 0.1 % ointment APPLY TO AFFECTED AREA EVERY DAY  0  . triamcinolone cream (KENALOG) 0.1 % APPLY TO AFFECTED AREA TWICE A  DAY FOR 2-4 WEEKS AS NEEDED FOR FLARES  1  . [DISCONTINUED] famotidine (PEPCID) 20 MG tablet Take 20 mg by mouth 2 (two) times daily.     No current facility-administered medications on file prior to visit.    Allergies  Allergen Reactions  . Codeine Nausea And Vomiting  . Hydrocodone Nausea And Vomiting  . Percocet [Oxycodone-Acetaminophen] Nausea And Vomiting   Social History   Socioeconomic History  . Marital status: Married    Spouse name: Not on file  . Number of children: 4  . Years of education: Not on file  . Highest education level: Not on file  Social Needs  . Financial resource strain: Not on file  . Food insecurity - worry: Not on file  . Food insecurity - inability: Not on file  .  Transportation needs - medical: Not on file  . Transportation needs - non-medical: Not on file  Occupational History  . Occupation: retired  Tobacco Use  . Smoking status: Former Smoker    Types: Cigars  . Smokeless tobacco: Former Systems developer    Quit date: 10/05/1979  Substance and Sexual Activity  . Alcohol use: No  . Drug use: No  . Sexual activity: Not on file  Other Topics Concern  . Not on file  Social History Narrative  . Not on file      Review of Systems  All other systems reviewed and are negative.      Objective:   Physical Exam  Cardiovascular: Normal rate, regular rhythm and normal heart sounds.  Pulmonary/Chest: Effort normal and breath sounds normal. No respiratory distress. He has no wheezes. He has no rales.  Musculoskeletal: He exhibits edema.  Skin: Rash noted. There is erythema.  Vitals reviewed.         Assessment & Plan:  Atopic neurodermatitis  Recommended using moisturizers twice daily as a preventative to help control the skin condition. I recommended Eucerin or aquaphor to help prevent exacerbations.  During exacerbations, he can use diprolene bid for 7-10 days to help manage the itching.  Consider increasing gabapentin for neuropathic itching if no better

## 2017-12-06 ENCOUNTER — Telehealth: Payer: Self-pay | Admitting: Family Medicine

## 2017-12-06 NOTE — Telephone Encounter (Signed)
[name] called to get clarification on taking levothyroxine, please call her back at [phone]

## 2017-12-07 ENCOUNTER — Other Ambulatory Visit: Payer: Self-pay | Admitting: Family Medicine

## 2017-12-07 NOTE — Telephone Encounter (Signed)
Called and spoke to [name] and states that he is on 2 medications for this thyroid and she was not sure the names of the medications but wanted to know if he should continue them or not since they will need refills?? Informed her I did not see 2 meds on list for thyroid just the one. She will go home and check his medications and call me back with the names of the medications she is referring to.

## 2017-12-11 ENCOUNTER — Other Ambulatory Visit: Payer: Self-pay | Admitting: Family Medicine

## 2017-12-11 DIAGNOSIS — E038 Other specified hypothyroidism: Secondary | ICD-10-CM

## 2017-12-11 DIAGNOSIS — Z79899 Other long term (current) drug therapy: Secondary | ICD-10-CM

## 2017-12-11 NOTE — Telephone Encounter (Signed)
Called and spoke to [place] and she just needed a refill on his thyroid medication however it is time for [place] to recheck a TSH. She will bring him in to have labs drawn before refill medication.

## 2017-12-14 ENCOUNTER — Other Ambulatory Visit: Payer: Medicare Other

## 2017-12-14 DIAGNOSIS — Z79899 Other long term (current) drug therapy: Secondary | ICD-10-CM | POA: Diagnosis not present

## 2017-12-14 DIAGNOSIS — H353223 Exudative age-related macular degeneration, left eye, with inactive scar: Secondary | ICD-10-CM | POA: Diagnosis not present

## 2017-12-14 DIAGNOSIS — H35433 Paving stone degeneration of retina, bilateral: Secondary | ICD-10-CM | POA: Diagnosis not present

## 2017-12-14 DIAGNOSIS — E038 Other specified hypothyroidism: Secondary | ICD-10-CM

## 2017-12-14 DIAGNOSIS — H43813 Vitreous degeneration, bilateral: Secondary | ICD-10-CM | POA: Diagnosis not present

## 2017-12-14 DIAGNOSIS — H353211 Exudative age-related macular degeneration, right eye, with active choroidal neovascularization: Secondary | ICD-10-CM | POA: Diagnosis not present

## 2017-12-14 LAB — TSH: TSH: 4.71 mIU/L — ABNORMAL HIGH (ref 0.40–4.50)

## 2017-12-19 ENCOUNTER — Other Ambulatory Visit: Payer: Self-pay | Admitting: Family Medicine

## 2017-12-19 MED ORDER — LEVOTHYROXINE SODIUM 112 MCG PO TABS: 112.0000 ug | ORAL_TABLET | Freq: Every day | ORAL | 1 refills | Status: DC

## 2018-01-14 ENCOUNTER — Other Ambulatory Visit: Payer: Self-pay | Admitting: Family Medicine

## 2018-02-03 ENCOUNTER — Other Ambulatory Visit: Payer: Self-pay | Admitting: Family Medicine

## 2018-02-13 ENCOUNTER — Other Ambulatory Visit: Payer: Self-pay | Admitting: Family Medicine

## 2018-02-19 ENCOUNTER — Ambulatory Visit: Payer: Medicare Other | Admitting: Family Medicine

## 2018-02-20 ENCOUNTER — Encounter: Payer: Self-pay | Admitting: Family Medicine

## 2018-02-20 ENCOUNTER — Ambulatory Visit (INDEPENDENT_AMBULATORY_CARE_PROVIDER_SITE_OTHER): Payer: Medicare Other | Admitting: Family Medicine

## 2018-02-20 VITALS — BP 132/72 | HR 76 | Temp 97.8°F | Resp 18 | Ht 73.0 in | Wt 210.0 lb

## 2018-02-20 DIAGNOSIS — E039 Hypothyroidism, unspecified: Secondary | ICD-10-CM | POA: Diagnosis not present

## 2018-02-20 LAB — COMPLETE METABOLIC PANEL WITH GFR
AG Ratio: 1.4 (calc) (ref 1.0–2.5)
ALT: 9 U/L (ref 9–46)
AST: 17 U/L (ref 10–35)
Albumin: 4.3 g/dL (ref 3.6–5.1)
Alkaline phosphatase (APISO): 61 U/L (ref 40–115)
BUN/Creatinine Ratio: 15 (calc) (ref 6–22)
BUN: 23 mg/dL (ref 7–25)
CO2: 26 mmol/L (ref 20–32)
Calcium: 9.6 mg/dL (ref 8.6–10.3)
Chloride: 108 mmol/L (ref 98–110)
Creat: 1.49 mg/dL — ABNORMAL HIGH (ref 0.70–1.11)
GFR, Est African American: 45 mL/min/{1.73_m2} — ABNORMAL LOW (ref >=60)
GFR, Est Non African American: 39 mL/min/{1.73_m2} — ABNORMAL LOW (ref >=60)
Globulin: 3 g/dL (calc) (ref 1.9–3.7)
Glucose, Bld: 104 mg/dL — ABNORMAL HIGH (ref 65–99)
Potassium: 4.6 mmol/L (ref 3.5–5.3)
Sodium: 144 mmol/L (ref 135–146)
Total Bilirubin: 0.6 mg/dL (ref 0.2–1.2)
Total Protein: 7.3 g/dL (ref 6.1–8.1)

## 2018-02-20 LAB — CBC WITH DIFFERENTIAL/PLATELET
Basophils Absolute: 18 cells/uL (ref 0–200)
Basophils Relative: 0.4 %
Eosinophils Absolute: 90 cells/uL (ref 15–500)
Eosinophils Relative: 2 %
HCT: 40.9 % (ref 38.5–50.0)
Hemoglobin: 13.8 g/dL (ref 13.2–17.1)
Lymphs Abs: 1593 cells/uL (ref 850–3900)
MCH: 30.9 pg (ref 27.0–33.0)
MCHC: 33.7 g/dL (ref 32.0–36.0)
MCV: 91.5 fL (ref 80.0–100.0)
MPV: 10.8 fL (ref 7.5–12.5)
Monocytes Relative: 10.5 %
Neutro Abs: 2327 cells/uL (ref 1500–7800)
Neutrophils Relative %: 51.7 %
Platelets: 157 10*3/uL (ref 140–400)
RBC: 4.47 10*6/uL (ref 4.20–5.80)
RDW: 13 % (ref 11.0–15.0)
Total Lymphocyte: 35.4 %
WBC mixed population: 473 cells/uL (ref 200–950)
WBC: 4.5 10*3/uL (ref 3.8–10.8)

## 2018-02-20 LAB — TSH: TSH: 0.75 mIU/L (ref 0.40–4.50)

## 2018-02-20 NOTE — Progress Notes (Signed)
Subjective:    Patient ID: [name], male    DOB: 02-Aug-1919, 84 y.o.   MRN: [id number]  HPI Patient is a very pleasant 84 year old Caucasian male here today for a generalized checkup.  Last year, due to fatigue, we increased his levothyroxine.  He is here today to recheck his TSH on the elevated dose.  His only concern is persistent pain in both feet.  He has chronic venous insufficiency, varicose veins, and chronic leg swelling.  At times, his feet hurt and ache due to the swelling.  They feel better when he elevates his feet.  He is on amlodipine for blood pressure which could be contributing to his leg swelling.  He is taking Lasix 20 mg a day to help with the leg swelling.  We have used amlodipine due to his chronic kidney disease and his elevated blood pressure. Past Medical History:  Diagnosis Date  . Acid reflux   . Cancer ([place]) 09/05/13   left neck-non-hodgkins lymphoma  . Hypertension    requires diuretic  . Hypothyroidism   . Malaria    while in the service  . Peripheral vascular disease ([place])    slow wound healing on legs   Past Surgical History:  Procedure Laterality Date  . AMPUTATION Left 06/25/2015   Procedure: LEFT  HALLUX AMPUTATION,;  Surgeon: Wylene Simmer, MD;  Location: [facility];  Service: Orthopedics;  Laterality: Left;  LOCAL/MAC  . MASS BIOPSY Left 09/05/2013   Procedure: EXCISIONAL BIOPSY OF LEFT NECK MASS;  Surgeon: Jerrell Belfast, MD;  Location: [facility];  Service: ENT;  Laterality: Left;  . TONSILLECTOMY    . YAG LASER APPLICATION Right 2/94/7654   Procedure: YAG LASER APPLICATION;  Surgeon: [name], MD;  Location: [facility];  Service: Ophthalmology;  Laterality: Right;   Current Outpatient Medications on File Prior to Visit  Medication Sig Dispense Refill  . amLODipine (NORVASC) 5 MG tablet Take 1 tablet (5 mg total) by mouth daily. 90 tablet 3  . aspirin EC 81 MG tablet Take 1 tablet (81 mg total) by mouth daily.    . betamethasone  dipropionate (DIPROLENE) 0.05 % cream Apply topically 2 (two) times daily. 45 g 1  . enalapril (VASOTEC) 20 MG tablet TAKE 1 TABLET BY MOUTH  EVERY DAY 90 tablet 3  . furosemide (LASIX) 20 MG tablet TAKE 1 TABLET BY MOUTH  EVERY DAY 90 tablet 1  . gabapentin (NEURONTIN) 100 MG capsule TAKE 2 CAPSULES EVERY  MORNING 180 capsule 4  . gabapentin (NEURONTIN) 300 MG capsule TAKE 1 CAPSULE BY MOUTH AT  BEDTIME 90 capsule 4  . levothyroxine (SYNTHROID, LEVOTHROID) 112 MCG tablet TAKE 1 TABLET BY MOUTH EVERY DAY 30 tablet 1  . mometasone (ELOCON) 0.1 % ointment APPLY TO AFFECTED AREA EVERY DAY  0  . traMADol (ULTRAM) 50 MG tablet TAKE 1 TABLET EVERY 8 HOURS AS NEEDED FOR PAIN 30 tablet 1  . triamcinolone cream (KENALOG) 0.1 % APPLY TO AFFECTED AREA TWICE A DAY FOR 2-4 WEEKS AS NEEDED FOR FLARES  1  . vitamin B-12 (CYANOCOBALAMIN) 1000 MCG tablet Take 1 tablet (1,000 mcg total) by mouth daily. 30 tablet 5  . [DISCONTINUED] famotidine (PEPCID) 20 MG tablet Take 20 mg by mouth 2 (two) times daily.     No current facility-administered medications on file prior to visit.    Allergies  Allergen Reactions  . Codeine Nausea And Vomiting  . Hydrocodone Nausea And Vomiting  . Percocet [Oxycodone-Acetaminophen]  Nausea And Vomiting   Social History   Socioeconomic History  . Marital status: Married    Spouse name: Not on file  . Number of children: 4  . Years of education: Not on file  . Highest education level: Not on file  Occupational History  . Occupation: retired  Scientific laboratory technician  . Financial resource strain: Not on file  . Food insecurity:    Worry: Not on file    Inability: Not on file  . Transportation needs:    Medical: Not on file    Non-medical: Not on file  Tobacco Use  . Smoking status: Former Smoker    Types: Cigars  . Smokeless tobacco: Former Systems developer    Quit date: 10/05/1979  Substance and Sexual Activity  . Alcohol use: No  . Drug use: No  . Sexual activity: Not on file    Lifestyle  . Physical activity:    Days per week: Not on file    Minutes per session: Not on file  . Stress: Not on file  Relationships  . Social connections:    Talks on phone: Not on file    Gets together: Not on file    Attends religious service: Not on file    Active member of club or organization: Not on file    Attends meetings of clubs or organizations: Not on file    Relationship status: Not on file  . Intimate partner violence:    Fear of current or ex partner: Not on file    Emotionally abused: Not on file    Physically abused: Not on file    Forced sexual activity: Not on file  Other Topics Concern  . Not on file  Social History Narrative  . Not on file      Review of Systems  All other systems reviewed and are negative.      Objective:   Physical Exam  Constitutional: He appears well-developed and well-nourished. No distress.  Cardiovascular: Normal rate, regular rhythm and normal heart sounds. Exam reveals no gallop and no friction rub.  No murmur heard. Pulmonary/Chest: Effort normal and breath sounds normal. No respiratory distress. He has no wheezes. He has no rales. He exhibits no tenderness.  Abdominal: Soft. Bowel sounds are normal. He exhibits no distension. There is no tenderness. There is no rebound and no guarding.  Musculoskeletal: He exhibits edema.  Skin: He is not diaphoretic.  Vitals reviewed.         Assessment & Plan:  Hypothyroidism, unspecified type - Plan: COMPLETE METABOLIC PANEL WITH GFR, CBC with Differential/Platelet, TSH  I will recheck the patient's TSH today to ensure a therapeutic dosage of his levothyroxine.  I have recommended increasing his Lasix to 40 mg a day.  I will obtain a baseline BMP today and I recommended we recheck his BMP in 1 week to ensure we do not exacerbate his chronic kidney disease.  If the increased diuresis helps his pain in his feet, I would keep him at this dose as long as his kidneys can tolerate  it.  If they see no benefit, I would recommend reducing the dose back to 20 mg a day to avoid dehydration.  Could consider discontinuing amlodipine and/or gabapentin as these can contribute to peripheral edema however his peripheral edema is mainly due to chronic venous insufficiency.

## 2018-03-05 DIAGNOSIS — H353211 Exudative age-related macular degeneration, right eye, with active choroidal neovascularization: Secondary | ICD-10-CM | POA: Diagnosis not present

## 2018-03-05 DIAGNOSIS — H43813 Vitreous degeneration, bilateral: Secondary | ICD-10-CM | POA: Diagnosis not present

## 2018-03-05 DIAGNOSIS — H353223 Exudative age-related macular degeneration, left eye, with inactive scar: Secondary | ICD-10-CM | POA: Diagnosis not present

## 2018-03-05 DIAGNOSIS — H35433 Paving stone degeneration of retina, bilateral: Secondary | ICD-10-CM | POA: Diagnosis not present

## 2018-03-13 ENCOUNTER — Telehealth: Payer: Self-pay | Admitting: Family Medicine

## 2018-03-13 NOTE — Telephone Encounter (Signed)
ok 

## 2018-03-13 NOTE — Telephone Encounter (Signed)
Pt's daughter called and stated that since increasing the Furosemide they have noticed no difference in his legs swelling so she wanted to let you know that she was putting him back on the one tab qd.

## 2018-04-10 ENCOUNTER — Ambulatory Visit (INDEPENDENT_AMBULATORY_CARE_PROVIDER_SITE_OTHER): Payer: Medicare Other | Admitting: Family Medicine

## 2018-04-10 ENCOUNTER — Encounter: Payer: Self-pay | Admitting: Family Medicine

## 2018-04-10 VITALS — BP 146/80 | HR 70 | Temp 97.6°F | Resp 18 | Ht 73.0 in | Wt 206.0 lb

## 2018-04-10 DIAGNOSIS — M7989 Other specified soft tissue disorders: Secondary | ICD-10-CM | POA: Diagnosis not present

## 2018-04-10 MED ORDER — HYDROCODONE-ACETAMINOPHEN 5-325 MG PO TABS: 1.0000 | ORAL_TABLET | Freq: Four times a day (QID) | ORAL | 0 refills | Status: DC | PRN

## 2018-04-10 NOTE — Progress Notes (Signed)
Subjective:    Patient ID: [name], male    DOB: [birth year], 84 y.o.   MRN: [id number]  HPI Patient is a very pleasant 84 year old Caucasian male here today complaining of severe leg pain and unilateral edema.  He has a long-standing history of chronic venous insufficiency, pitting edema in both legs and chronic venous stasis dermatitis which frequently gets infected and cause a cellulitis.  However recently, he has noticed unilateral edema in the right leg swelling twice as big as his left leg.  If he does not keep his right leg elevated, it will swell twice the size of his left leg distal to the knee.  Is also aching and throbbing more in keeping him awake at night.  There is some mild erythema in both legs.  The erythema however is due to chronic inflammation and chronic venous stasis dermatitis.  It extends to just above the ankle in the left leg and to the mid shin on the right leg.  There is no warmth.  He has negative Homans sign today Past Medical History:  Diagnosis Date  . Acid reflux   . Cancer ([place]) 09/05/13   left neck-non-hodgkins lymphoma  . Hypertension    requires diuretic  . Hypothyroidism   . Malaria    while in the service  . Peripheral vascular disease ([place])    slow wound healing on legs   Past Surgical History:  Procedure Laterality Date  . AMPUTATION Left 06/25/2015   Procedure: LEFT  HALLUX AMPUTATION,;  Surgeon: Wylene Simmer, MD;  Location: [place];  Service: Orthopedics;  Laterality: Left;  LOCAL/MAC  . MASS BIOPSY Left 09/05/2013   Procedure: EXCISIONAL BIOPSY OF LEFT NECK MASS;  Surgeon: Jerrell Belfast, MD;  Location: [place];  Service: ENT;  Laterality: Left;  . TONSILLECTOMY    . YAG LASER APPLICATION Right 03/22/9562   Procedure: YAG LASER APPLICATION;  Surgeon: [name], MD;  Location: [facility];  Service: Ophthalmology;  Laterality: Right;   Current Outpatient Medications on File Prior to Visit  Medication Sig Dispense Refill  .  amLODipine (NORVASC) 5 MG tablet Take 1 tablet (5 mg total) by mouth daily. 90 tablet 3  . aspirin EC 81 MG tablet Take 1 tablet (81 mg total) by mouth daily.    . betamethasone dipropionate (DIPROLENE) 0.05 % cream Apply topically 2 (two) times daily. 45 g 1  . enalapril (VASOTEC) 20 MG tablet TAKE 1 TABLET BY MOUTH  EVERY DAY 90 tablet 3  . furosemide (LASIX) 20 MG tablet TAKE 1 TABLET BY MOUTH  EVERY DAY (Patient taking differently: TAKE 2 TABLET BY MOUTH  EVERY DAY) 90 tablet 1  . gabapentin (NEURONTIN) 100 MG capsule TAKE 2 CAPSULES EVERY  MORNING 180 capsule 4  . gabapentin (NEURONTIN) 300 MG capsule TAKE 1 CAPSULE BY MOUTH AT  BEDTIME 90 capsule 4  . levothyroxine (SYNTHROID, LEVOTHROID) 112 MCG tablet TAKE 1 TABLET BY MOUTH EVERY DAY 30 tablet 1  . mometasone (ELOCON) 0.1 % ointment APPLY TO AFFECTED AREA EVERY DAY  0  . traMADol (ULTRAM) 50 MG tablet TAKE 1 TABLET EVERY 8 HOURS AS NEEDED FOR PAIN 30 tablet 1  . triamcinolone cream (KENALOG) 0.1 % APPLY TO AFFECTED AREA TWICE A DAY FOR 2-4 WEEKS AS NEEDED FOR FLARES  1  . vitamin B-12 (CYANOCOBALAMIN) 1000 MCG tablet Take 1 tablet (1,000 mcg total) by mouth daily. 30 tablet 5  . [DISCONTINUED] famotidine (PEPCID) 20 MG tablet Take 20  mg by mouth 2 (two) times daily.     No current facility-administered medications on file prior to visit.    Allergies  Allergen Reactions  . Codeine Nausea And Vomiting  . Hydrocodone Nausea And Vomiting  . Percocet [Oxycodone-Acetaminophen] Nausea And Vomiting   Social History   Socioeconomic History  . Marital status: Married    Spouse name: Not on file  . Number of children: 4  . Years of education: Not on file  . Highest education level: Not on file  Occupational History  . Occupation: retired  Scientific laboratory technician  . Financial resource strain: Not on file  . Food insecurity:    Worry: Not on file    Inability: Not on file  . Transportation needs:    Medical: Not on file    Non-medical: Not on  file  Tobacco Use  . Smoking status: Former Smoker    Types: Cigars  . Smokeless tobacco: Former Systems developer    Quit date: 10/05/1979  Substance and Sexual Activity  . Alcohol use: No  . Drug use: No  . Sexual activity: Not on file  Lifestyle  . Physical activity:    Days per week: Not on file    Minutes per session: Not on file  . Stress: Not on file  Relationships  . Social connections:    Talks on phone: Not on file    Gets together: Not on file    Attends religious service: Not on file    Active member of club or organization: Not on file    Attends meetings of clubs or organizations: Not on file    Relationship status: Not on file  . Intimate partner violence:    Fear of current or ex partner: Not on file    Emotionally abused: Not on file    Physically abused: Not on file    Forced sexual activity: Not on file  Other Topics Concern  . Not on file  Social History Narrative  . Not on file      Review of Systems  Musculoskeletal: Positive for back pain.  All other systems reviewed and are negative.      Objective:   Physical Exam  Constitutional: He appears well-developed and well-nourished. No distress.  Cardiovascular: Normal rate, regular rhythm and normal heart sounds. Exam reveals no gallop and no friction rub.  No murmur heard. Pulmonary/Chest: Effort normal and breath sounds normal. No respiratory distress. He has no wheezes. He has no rales. He exhibits no tenderness.  Abdominal: Soft. Bowel sounds are normal. He exhibits no distension. There is no tenderness. There is no rebound and no guarding.  Musculoskeletal: He exhibits edema.  Skin: He is not diaphoretic.  Vitals reviewed. Unilateral leg swelling right greater than left.  Chronic venous stasis dermatitis on both legs right greater than left erythema to the mid shin on the right, just above the ankle on the left        Assessment & Plan:  Right leg swelling - Plan: US Venous Img Lower Unilateral  Right  Patient has a long-standing history of chronic venous insufficiency and leg swelling.  However the sudden onset of right asymmetric leg swelling and severe pain in his right leg is unusual.  Given his sedentary lifestyle, he is basically confined to wheelchair, he is at high risk for DVT.  Therefore I will obtain an ultrasound of the right leg to rule out DVT.  Once DVT is ruled out, I would recommend an [place]  boot be applied to the right leg to control swelling until the scabs and excoriations can heal.  At that point I would switch the patient back to daily compression hose therapy.  Meanwhile I will try to treat his pain better.  He can take hydrocodone 5/325 1 p.o. every 6 hours as needed pain.  He has a listed allergy to codeine which causes nausea however he does not recollect having a bad reaction to hydrocodone despite this being listed in his chart.  Tramadol is not relieving his pain at night and he is in misery throughout the night calling his daughter stating that his legs hurt.  Therefore I recommended that we try this again instead of tramadol.  I recommended using a stool softener to prevent constipation with the new pain medication.  Spent more than 25 minutes with the patient and the family explaining that we need to focus on his quality of life and controlling his pain rather than fixating on controlling the swelling.

## 2018-04-17 ENCOUNTER — Other Ambulatory Visit: Payer: Self-pay | Admitting: Family Medicine

## 2018-04-18 ENCOUNTER — Ambulatory Visit (HOSPITAL_COMMUNITY)
Admission: RE | Admit: 2018-04-18 | Discharge: 2018-04-18 | Disposition: A | Discharge status: Home / Self Care | Payer: Medicare Other | Source: Ambulatory Visit | Attending: Family Medicine | Admitting: Family Medicine

## 2018-04-18 ENCOUNTER — Other Ambulatory Visit: Payer: Self-pay | Admitting: Family Medicine

## 2018-04-18 ENCOUNTER — Other Ambulatory Visit: Payer: Self-pay

## 2018-04-18 DIAGNOSIS — M7989 Other specified soft tissue disorders: Secondary | ICD-10-CM | POA: Insufficient documentation

## 2018-04-18 DIAGNOSIS — R600 Localized edema: Secondary | ICD-10-CM | POA: Diagnosis not present

## 2018-04-26 ENCOUNTER — Ambulatory Visit (INDEPENDENT_AMBULATORY_CARE_PROVIDER_SITE_OTHER): Payer: Medicare Other | Admitting: Family Medicine

## 2018-04-26 ENCOUNTER — Encounter: Payer: Self-pay | Admitting: Family Medicine

## 2018-04-26 VITALS — BP 140/96 | HR 68 | Temp 97.5°F | Resp 16 | Ht 73.0 in | Wt 205.0 lb

## 2018-04-26 DIAGNOSIS — I872 Venous insufficiency (chronic) (peripheral): Secondary | ICD-10-CM

## 2018-04-26 DIAGNOSIS — L03116 Cellulitis of left lower limb: Secondary | ICD-10-CM | POA: Diagnosis not present

## 2018-04-26 DIAGNOSIS — M7989 Other specified soft tissue disorders: Secondary | ICD-10-CM

## 2018-04-26 MED ORDER — CEPHALEXIN 500 MG PO CAPS: 500.0000 mg | ORAL_CAPSULE | Freq: Three times a day (TID) | ORAL | 0 refills | Status: DC

## 2018-04-26 MED ORDER — LEVOTHYROXINE SODIUM 112 MCG PO TABS: 112.0000 ug | ORAL_TABLET | Freq: Every day | ORAL | 3 refills | Status: DC

## 2018-04-26 NOTE — Progress Notes (Signed)
Subjective:    Patient ID: [name], male    DOB: 05-18-1919, 84 y.o.   MRN: [id number]  Back Pain    Patient is a very pleasant 84 year old Caucasian male here today complaining of severe leg pain and swelling.  Patient has a history of chronic venous insufficiency, pitting edema in both legs, venous stasis dermatitis, and neuropathic leg pain.  After his last visit, obtain venous ultrasounds and ruled out DVT.  Leg swelling in the right leg is worse than swelling in the left leg however pain is equal and symmetric.  He reports burning stinging pain in both legs.  He reports weeping edema, dry cracked fissured skin, aching pain in both legs.  The right foot is fiery red, hot to the touch, and painful.  He does have a history of peripheral vascular disease.  Arterial Dopplers obtained in 2018 revealed: Summary:  - Right ABI indicates normal arterial flow at rest. Doppler waveforms may suggest a falsely elevated pressure. - Left ABI indicates a moderate reduction in arterial flow at rest. Doppler waveforms may suggest a possible elevation in pressures.   Past Medical History:  Diagnosis Date  . Acid reflux   . Cancer ([place]) 09/05/13   left neck-non-hodgkins lymphoma  . Hypertension    requires diuretic  . Hypothyroidism   . Malaria    while in the service  . Peripheral vascular disease ([place])    slow wound healing on legs   Past Surgical History:  Procedure Laterality Date  . AMPUTATION Left 06/25/2015   Procedure: LEFT  HALLUX AMPUTATION,;  Surgeon: Wylene Simmer, MD;  Location: [place];  Service: Orthopedics;  Laterality: Left;  LOCAL/MAC  . MASS BIOPSY Left 09/05/2013   Procedure: EXCISIONAL BIOPSY OF LEFT NECK MASS;  Surgeon: Jerrell Belfast, MD;  Location: [place];  Service: ENT;  Laterality: Left;  . TONSILLECTOMY    . YAG LASER APPLICATION Right 3/38/2505   Procedure: YAG LASER APPLICATION;  Surgeon: [name], MD;  Location: [facility];  Service:  Ophthalmology;  Laterality: Right;   Current Outpatient Medications on File Prior to Visit  Medication Sig Dispense Refill  . amLODipine (NORVASC) 5 MG tablet Take 1 tablet (5 mg total) by mouth daily. 90 tablet 3  . aspirin EC 81 MG tablet Take 1 tablet (81 mg total) by mouth daily.    . betamethasone dipropionate (DIPROLENE) 0.05 % cream Apply topically 2 (two) times daily. 45 g 1  . enalapril (VASOTEC) 20 MG tablet TAKE 1 TABLET BY MOUTH  EVERY DAY 90 tablet 3  . furosemide (LASIX) 20 MG tablet TAKE 1 TABLET BY MOUTH  EVERY DAY (Patient taking differently: TAKE 2 TABLET BY MOUTH  EVERY DAY) 90 tablet 1  . gabapentin (NEURONTIN) 100 MG capsule TAKE 2 CAPSULES EVERY  MORNING 180 capsule 4  . gabapentin (NEURONTIN) 300 MG capsule TAKE 1 CAPSULE BY MOUTH AT  BEDTIME 90 capsule 4  . HYDROcodone-acetaminophen (NORCO) 5-325 MG tablet Take 1 tablet by mouth every 6 (six) hours as needed for moderate pain. 30 tablet 0  . mometasone (ELOCON) 0.1 % ointment APPLY TO AFFECTED AREA EVERY DAY  0  . traMADol (ULTRAM) 50 MG tablet TAKE 1 TABLET EVERY 8 HOURS AS NEEDED FOR PAIN 30 tablet 1  . triamcinolone cream (KENALOG) 0.1 % APPLY TO AFFECTED AREA TWICE A DAY FOR 2-4 WEEKS AS NEEDED FOR FLARES  1  . vitamin B-12 (CYANOCOBALAMIN) 1000 MCG tablet Take 1 tablet (1,000 mcg  total) by mouth daily. 30 tablet 5  . [DISCONTINUED] famotidine (PEPCID) 20 MG tablet Take 20 mg by mouth 2 (two) times daily.     No current facility-administered medications on file prior to visit.    Allergies  Allergen Reactions  . Codeine Nausea And Vomiting  . Hydrocodone Nausea And Vomiting  . Percocet [Oxycodone-Acetaminophen] Nausea And Vomiting   Social History   Socioeconomic History  . Marital status: Married    Spouse name: Not on file  . Number of children: 4  . Years of education: Not on file  . Highest education level: Not on file  Occupational History  . Occupation: retired  Scientific laboratory technician  . Financial resource  strain: Not on file  . Food insecurity:    Worry: Not on file    Inability: Not on file  . Transportation needs:    Medical: Not on file    Non-medical: Not on file  Tobacco Use  . Smoking status: Former Smoker    Types: Cigars  . Smokeless tobacco: Former Systems developer    Quit date: 10/05/1979  Substance and Sexual Activity  . Alcohol use: No  . Drug use: No  . Sexual activity: Not on file  Lifestyle  . Physical activity:    Days per week: Not on file    Minutes per session: Not on file  . Stress: Not on file  Relationships  . Social connections:    Talks on phone: Not on file    Gets together: Not on file    Attends religious service: Not on file    Active member of club or organization: Not on file    Attends meetings of clubs or organizations: Not on file    Relationship status: Not on file  . Intimate partner violence:    Fear of current or ex partner: Not on file    Emotionally abused: Not on file    Physically abused: Not on file    Forced sexual activity: Not on file  Other Topics Concern  . Not on file  Social History Narrative  . Not on file      Review of Systems  Musculoskeletal: Positive for back pain.  All other systems reviewed and are negative.      Objective:   Physical Exam  Constitutional: He appears well-developed and well-nourished. No distress.  Cardiovascular: Normal rate, regular rhythm and normal heart sounds. Exam reveals no gallop and no friction rub.  No murmur heard. Pulmonary/Chest: Effort normal and breath sounds normal. No respiratory distress. He has no wheezes. He has no rales. He exhibits no tenderness.  Abdominal: Soft. Bowel sounds are normal. He exhibits no distension. There is no tenderness. There is no rebound and no guarding.  Musculoskeletal: He exhibits edema.  Skin: He is not diaphoretic.  Vitals reviewed.  leg swelling right greater than left.  Chronic venous stasis dermatitis on both legs right greater than left erythema to  the mid shin on the right, just above the ankle on the left  Right foot is red hot and painful and appears to have cellulitis.        Assessment & Plan:  Right leg swelling  Cellulitis of left lower extremity  Venous stasis dermatitis of both lower extremities  Patient has a long-standing history of chronic venous insufficiency and leg swelling.  DVT has been ruled out.  I suspect chronic venous insufficiency causing leg pain, peripheral neuropathy, possibly an element of restless leg syndrome as his pain  is worse at rest or when lying down.  Patient was placed in Unna boots bilaterally.  I will start him on Keflex 500 mg p.o. 3 times daily for the cellulitis in the right foot.  I will have him recheck here on Monday with my partner to reevaluate the cellulitis and see if his leg pain is better.  If better, I will see the patient back on Friday after wearing the Unna boots for a full week to discuss chronic therapy moving forward and possibly home health nursing doing this at home.  If pain is no better next week, consider instituting Mirapex or Requip for neuropathic pain and possible restless leg syndrome on top of his gabapentin.

## 2018-04-30 ENCOUNTER — Encounter: Payer: Self-pay | Admitting: Family Medicine

## 2018-04-30 ENCOUNTER — Ambulatory Visit: Payer: Medicare Other | Admitting: Family Medicine

## 2018-04-30 ENCOUNTER — Other Ambulatory Visit: Payer: Self-pay

## 2018-04-30 ENCOUNTER — Ambulatory Visit (INDEPENDENT_AMBULATORY_CARE_PROVIDER_SITE_OTHER): Payer: Medicare Other | Admitting: Family Medicine

## 2018-04-30 VITALS — BP 142/88 | HR 72 | Temp 98.1°F | Resp 16 | Ht 73.0 in | Wt 208.0 lb

## 2018-04-30 DIAGNOSIS — H9202 Otalgia, left ear: Secondary | ICD-10-CM

## 2018-04-30 DIAGNOSIS — L03115 Cellulitis of right lower limb: Secondary | ICD-10-CM | POA: Diagnosis not present

## 2018-04-30 DIAGNOSIS — I872 Venous insufficiency (chronic) (peripheral): Secondary | ICD-10-CM | POA: Diagnosis not present

## 2018-04-30 DIAGNOSIS — I739 Peripheral vascular disease, unspecified: Secondary | ICD-10-CM | POA: Diagnosis not present

## 2018-04-30 NOTE — Assessment & Plan Note (Signed)
Improved edema, replace unna boot on right leg only Elevate left foot, can take 1 extra dose of lasix to help diuresis Return Friday for recheck

## 2018-04-30 NOTE — Patient Instructions (Signed)
F/U Friday DR. PICKARD Give 1 extra dose of lasix, elevate his foot  Complete the antibiotics

## 2018-04-30 NOTE — Progress Notes (Signed)
   Subjective:    Patient ID: [name], male    DOB: [birth year], 84 y.o.   MRN: [id number]  Patient presents for Follow-up (cellulitis ) and Ear Pain (x2 days- L ear pain- has used sweet oil )  Pt here for intermin f/u, seen last Friday by PCP, has , Unna boots placed secondary to leg swelling. Daughter had to cut boot in left foot as it caused ballooning of his foot and pain, swelling did improve some. No new SOB, no CP He also had concern for cellulitis on RLE so he was given oral antibiotics- Keflex and erythema has decreased per daughter.  No fever, no chills, appetite unchanged  He does complain of left ear pain, used sweet oil   Review Of Systems:  GEN- denies fatigue, fever, weight loss,weakness, recent illness HEENT- denies eye drainage, change in vision, nasal discharge, CVS- denies chest pain, palpitations RESP- denies SOB, cough, wheeze ABD- denies N/V, change in stools, abd pain GU- denies dysuria, hematuria, dribbling, incontinence MSK- denies joint pain,+ muscle aches, injury Neuro- denies headache, dizziness, syncope, seizure activity       Objective:    BP (!) 142/88   Pulse 72   Temp 98.1 F (36.7 C) (Oral)   Resp 16   Ht 6\' 1"  (1.854 m)   Wt 208 lb (94.3 kg)   SpO2 98%   BMI 27.44 kg/m  GEN- NAD, alert and oriented x3 HEENT- PERRL, EOMI, non injected sclera, pink conjunctiva, MMM, oropharynx clear, TTP over cartilage at entrance to left ear, no erythema, no effusion, minimal wax, some scaley AK lesions on ears bilat  Neck- Supple, no JVD CVS- RRR, no murmur RESP-CTAB ABD-NABS,soft,NT,ND EXT- chronic venous stasis changes, ballooning/swelling of left foot, mild TTP, trace edema left leg, Right leg,decreased pitting edema, multiple venous stasis scaling,decreased erythgema of shin to ankle, no warmth  Pulses- Radial 1+ DP-not palpated Ballooning of left foot        Assessment & Plan:      Problem List Items Addressed This Visit      Unprioritized   Peripheral vascular disease ([place])   Cellulitis   Venous stasis dermatitis - Primary    Improved edema, replace unna boot on right leg only Elevate left foot, can take 1 extra dose of lasix to help diuresis Return Friday for recheck         Other Visit Diagnoses    Left ear pain       mild tenderness over cartilage, no erythema, no wax impaction, no effusion      Note: This dictation was prepared with Dragon dictation along with smaller phrase technology. Any transcriptional errors that result from this process are unintentional.

## 2018-05-04 ENCOUNTER — Ambulatory Visit (INDEPENDENT_AMBULATORY_CARE_PROVIDER_SITE_OTHER): Payer: Medicare Other | Admitting: Family Medicine

## 2018-05-04 VITALS — BP 118/80 | HR 58 | Temp 97.7°F | Resp 18 | Ht 73.0 in | Wt 208.0 lb

## 2018-05-04 DIAGNOSIS — I872 Venous insufficiency (chronic) (peripheral): Secondary | ICD-10-CM | POA: Diagnosis not present

## 2018-05-04 DIAGNOSIS — L03115 Cellulitis of right lower limb: Secondary | ICD-10-CM

## 2018-05-04 DIAGNOSIS — M7989 Other specified soft tissue disorders: Secondary | ICD-10-CM | POA: Diagnosis not present

## 2018-05-04 NOTE — Progress Notes (Signed)
Subjective:    Patient ID: [name], male    DOB: [birth year], 84 y.o.   MRN: [id number]  04/26/18  Patient is a very pleasant 84 year old Caucasian male here today complaining of severe leg pain and swelling.  Patient has a history of chronic venous insufficiency, pitting edema in both legs, venous stasis dermatitis, and neuropathic leg pain.  After his last visit, obtain venous ultrasounds and ruled out DVT.  Leg swelling in the right leg is worse than swelling in the left leg however pain is equal and symmetric.  He reports burning stinging pain in both legs.  He reports weeping edema, dry cracked fissured skin, aching pain in both legs.  The right foot is fiery red, hot to the touch, and painful.  He does have a history of peripheral vascular disease.  Arterial Dopplers obtained in 2018 revealed: Summary:  - Right ABI indicates normal arterial flow at rest. Doppler waveforms may suggest a falsely elevated pressure. - Left ABI indicates a moderate reduction in arterial flow at rest. Doppler waveforms may suggest a possible elevation in pressures.  At that time, my plan was: Patient has a long-standing history of chronic venous insufficiency and leg swelling.  DVT has been ruled out.  I suspect chronic venous insufficiency causing leg pain, peripheral neuropathy, possibly an element of restless leg syndrome as his pain is worse at rest or when lying down.  Patient was placed in Unna boots bilaterally.  I will start him on Keflex 500 mg p.o. 3 times daily for the cellulitis in the right foot.  I will have him recheck here on Monday with my partner to reevaluate the cellulitis and see if his leg pain is better.  If better, I will see the patient back on Friday after wearing the Unna boots for a full week to discuss chronic therapy moving forward and possibly home health nursing doing this at home.  If pain is no better next week, consider instituting Mirapex or Requip for neuropathic pain and  possible restless leg syndrome on top of his gabapentin.  05/04/18 Here for follow up.  Patient states that his legs feel much better.  He states that they feel like a new pair of legs.  The swelling has completely resolved.  The erythema in his right foot has subsided dramatically.  He denies any pain at night when he lies down now simply by wearing the compression Unna boots.  He denies any burning pain in his legs.  He denies any stinging pain in his legs. Past Medical History:  Diagnosis Date  . Acid reflux   . Cancer ([place]) 09/05/13   left neck-non-hodgkins lymphoma  . Hypertension    requires diuretic  . Hypothyroidism   . Malaria    while in the service  . Peripheral vascular disease ([place])    slow wound healing on legs   Past Surgical History:  Procedure Laterality Date  . AMPUTATION Left 06/25/2015   Procedure: LEFT  HALLUX AMPUTATION,;  Surgeon: Wylene Simmer, MD;  Location: [facility];  Service: Orthopedics;  Laterality: Left;  LOCAL/MAC  . MASS BIOPSY Left 09/05/2013   Procedure: EXCISIONAL BIOPSY OF LEFT NECK MASS;  Surgeon: Jerrell Belfast, MD;  Location: [facility];  Service: ENT;  Laterality: Left;  . TONSILLECTOMY    . YAG LASER APPLICATION Right 5/36/6440   Procedure: YAG LASER APPLICATION;  Surgeon: [name], MD;  Location: [facility];  Service: Ophthalmology;  Laterality: Right;   Current  Outpatient Medications on File Prior to Visit  Medication Sig Dispense Refill  . amLODipine (NORVASC) 5 MG tablet Take 1 tablet (5 mg total) by mouth daily. 90 tablet 3  . aspirin EC 81 MG tablet Take 1 tablet (81 mg total) by mouth daily.    . betamethasone dipropionate (DIPROLENE) 0.05 % cream Apply topically 2 (two) times daily. 45 g 1  . cephALEXin (KEFLEX) 500 MG capsule Take 1 capsule (500 mg total) by mouth 3 (three) times daily. 21 capsule 0  . enalapril (VASOTEC) 20 MG tablet TAKE 1 TABLET BY MOUTH  EVERY DAY 90 tablet 3  . furosemide (LASIX) 20 MG tablet TAKE 1 TABLET  BY MOUTH  EVERY DAY (Patient taking differently: TAKE 2 TABLET BY MOUTH  EVERY DAY) 90 tablet 1  . gabapentin (NEURONTIN) 100 MG capsule TAKE 2 CAPSULES EVERY  MORNING 180 capsule 4  . gabapentin (NEURONTIN) 300 MG capsule TAKE 1 CAPSULE BY MOUTH AT  BEDTIME 90 capsule 4  . HYDROcodone-acetaminophen (NORCO) 5-325 MG tablet Take 1 tablet by mouth every 6 (six) hours as needed for moderate pain. 30 tablet 0  . levothyroxine (SYNTHROID, LEVOTHROID) 112 MCG tablet Take 1 tablet (112 mcg total) by mouth daily. 90 tablet 3  . mometasone (ELOCON) 0.1 % ointment APPLY TO AFFECTED AREA EVERY DAY  0  . traMADol (ULTRAM) 50 MG tablet TAKE 1 TABLET EVERY 8 HOURS AS NEEDED FOR PAIN 30 tablet 1  . triamcinolone cream (KENALOG) 0.1 % APPLY TO AFFECTED AREA TWICE A DAY FOR 2-4 WEEKS AS NEEDED FOR FLARES  1  . vitamin B-12 (CYANOCOBALAMIN) 1000 MCG tablet Take 1 tablet (1,000 mcg total) by mouth daily. 30 tablet 5  . [DISCONTINUED] famotidine (PEPCID) 20 MG tablet Take 20 mg by mouth 2 (two) times daily.     No current facility-administered medications on file prior to visit.    Allergies  Allergen Reactions  . Codeine Nausea And Vomiting  . Hydrocodone Nausea And Vomiting  . Percocet [Oxycodone-Acetaminophen] Nausea And Vomiting   Social History   Socioeconomic History  . Marital status: Married    Spouse name: Not on file  . Number of children: 4  . Years of education: Not on file  . Highest education level: Not on file  Occupational History  . Occupation: retired  Scientific laboratory technician  . Financial resource strain: Not on file  . Food insecurity:    Worry: Not on file    Inability: Not on file  . Transportation needs:    Medical: Not on file    Non-medical: Not on file  Tobacco Use  . Smoking status: Former Smoker    Types: Cigars  . Smokeless tobacco: Former Systems developer    Quit date: 10/05/1979  Substance and Sexual Activity  . Alcohol use: No  . Drug use: No  . Sexual activity: Not on file    Lifestyle  . Physical activity:    Days per week: Not on file    Minutes per session: Not on file  . Stress: Not on file  Relationships  . Social connections:    Talks on phone: Not on file    Gets together: Not on file    Attends religious service: Not on file    Active member of club or organization: Not on file    Attends meetings of clubs or organizations: Not on file    Relationship status: Not on file  . Intimate partner violence:    Fear of current  or ex partner: Not on file    Emotionally abused: Not on file    Physically abused: Not on file    Forced sexual activity: Not on file  Other Topics Concern  . Not on file  Social History Narrative  . Not on file      Review of Systems  Musculoskeletal: Positive for back pain.  All other systems reviewed and are negative.      Objective:   Physical Exam  Constitutional: He appears well-developed and well-nourished. No distress.  Cardiovascular: Normal rate, regular rhythm and normal heart sounds. Exam reveals no gallop and no friction rub.  No murmur heard. Pulmonary/Chest: Effort normal and breath sounds normal. No respiratory distress. He has no wheezes. He has no rales. He exhibits no tenderness.  Abdominal: Soft. Bowel sounds are normal. He exhibits no distension. There is no tenderness. There is no rebound and no guarding.  Musculoskeletal: He exhibits edema.  Skin: He is not diaphoretic.  Vitals reviewed.        Assessment & Plan:  Leg pain seems to be attributed to chronic venous insufficiency, leg swelling, cellulitis, and likely underlying peripheral neuropathy.  I will not treat restless leg syndrome at this time as his symptoms have completely subsided after wearing the Unna boots for 1 week.  I have recommended that he apply Vaseline to both legs every day as an emollient to also help with the dry cracked skin and then apply compression hose/stockings.  I recommended that he wear them every day to help  control the swelling and then remove them in the evening.  I cautioned him not to scratch at the dry skin because this could cause secondary infections.  As long as this is controlling his leg pain, I would not institute any new medication.

## 2018-05-12 ENCOUNTER — Other Ambulatory Visit: Payer: Self-pay | Admitting: Family Medicine

## 2018-05-14 ENCOUNTER — Other Ambulatory Visit: Payer: Self-pay | Admitting: Family Medicine

## 2018-05-29 ENCOUNTER — Other Ambulatory Visit: Payer: Self-pay | Admitting: Family Medicine

## 2018-05-29 DIAGNOSIS — J181 Lobar pneumonia, unspecified organism: Principal | ICD-10-CM

## 2018-05-29 DIAGNOSIS — J189 Pneumonia, unspecified organism: Secondary | ICD-10-CM

## 2018-05-29 MED ORDER — AMLODIPINE BESYLATE 5 MG PO TABS: 5.0000 mg | ORAL_TABLET | Freq: Every day | ORAL | 3 refills | Status: DC

## 2018-06-18 DIAGNOSIS — H353233 Exudative age-related macular degeneration, bilateral, with inactive scar: Secondary | ICD-10-CM | POA: Diagnosis not present

## 2018-06-18 DIAGNOSIS — H35433 Paving stone degeneration of retina, bilateral: Secondary | ICD-10-CM | POA: Diagnosis not present

## 2018-06-18 DIAGNOSIS — H43813 Vitreous degeneration, bilateral: Secondary | ICD-10-CM | POA: Diagnosis not present

## 2018-07-03 ENCOUNTER — Encounter: Payer: Self-pay | Admitting: Family Medicine

## 2018-07-03 ENCOUNTER — Ambulatory Visit (INDEPENDENT_AMBULATORY_CARE_PROVIDER_SITE_OTHER): Payer: Medicare Other | Admitting: Family Medicine

## 2018-07-03 VITALS — BP 154/84 | HR 44 | Temp 97.7°F | Resp 18

## 2018-07-03 DIAGNOSIS — L03116 Cellulitis of left lower limb: Secondary | ICD-10-CM | POA: Diagnosis not present

## 2018-07-03 DIAGNOSIS — I872 Venous insufficiency (chronic) (peripheral): Secondary | ICD-10-CM

## 2018-07-03 DIAGNOSIS — L03115 Cellulitis of right lower limb: Secondary | ICD-10-CM | POA: Diagnosis not present

## 2018-07-03 DIAGNOSIS — L2081 Atopic neurodermatitis: Secondary | ICD-10-CM | POA: Diagnosis not present

## 2018-07-03 MED ORDER — HYDROCODONE-ACETAMINOPHEN 5-325 MG PO TABS: 1.0000 | ORAL_TABLET | Freq: Four times a day (QID) | ORAL | 0 refills | Status: DC | PRN

## 2018-07-03 MED ORDER — CEPHALEXIN 500 MG PO CAPS: 500.0000 mg | ORAL_CAPSULE | Freq: Three times a day (TID) | ORAL | 0 refills | Status: DC

## 2018-07-03 NOTE — Progress Notes (Signed)
Subjective:    Patient ID: [name], male    DOB: Jan 29, 1919, 84 y.o.   MRN: [id number]  04/26/18  Patient is a very pleasant 84 year old Caucasian male here today complaining of severe leg pain and swelling.  Patient has a history of chronic venous insufficiency, pitting edema in both legs, venous stasis dermatitis, and neuropathic leg pain.  After his last visit, obtain venous ultrasounds and ruled out DVT.  Leg swelling in the right leg is worse than swelling in the left leg however pain is equal and symmetric.  He reports burning stinging pain in both legs.  He reports weeping edema, dry cracked fissured skin, aching pain in both legs.  The right foot is fiery red, hot to the touch, and painful.  He does have a history of peripheral vascular disease.  Arterial Dopplers obtained in 2018 revealed: Summary:  - Right ABI indicates normal arterial flow at rest. Doppler waveforms may suggest a falsely elevated pressure. - Left ABI indicates a moderate reduction in arterial flow at rest. Doppler waveforms may suggest a possible elevation in pressures.  At that time, my plan was: Patient has a long-standing history of chronic venous insufficiency and leg swelling.  DVT has been ruled out.  I suspect chronic venous insufficiency causing leg pain, peripheral neuropathy, possibly an element of restless leg syndrome as his pain is worse at rest or when lying down.  Patient was placed in Unna boots bilaterally.  I will start him on Keflex 500 mg p.o. 3 times daily for the cellulitis in the right foot.  I will have him recheck here on Monday with my partner to reevaluate the cellulitis and see if his leg pain is better.  If better, I will see the patient back on Friday after wearing the Unna boots for a full week to discuss chronic therapy moving forward and possibly home health nursing doing this at home.  If pain is no better next week, consider instituting Mirapex or Requip for neuropathic pain and  possible restless leg syndrome on top of his gabapentin.  05/04/18 Here for follow up.  Patient states that his legs feel much better.  He states that they feel like a new pair of legs.  The swelling has completely resolved.  The erythema in his right foot has subsided dramatically.  He denies any pain at night when he lies down now simply by wearing the compression Unna boots.  He denies any burning pain in his legs.  He denies any stinging pain in his legs.  At that time, my plan was: Leg pain seems to be attributed to chronic venous insufficiency, leg swelling, cellulitis, and likely underlying peripheral neuropathy.  I will not treat restless leg syndrome at this time as his symptoms have completely subsided after wearing the Unna boots for 1 week.  I have recommended that he apply Vaseline to both legs every day as an emollient to also help with the dry cracked skin and then apply compression hose/stockings.  I recommended that he wear them every day to help control the swelling and then remove them in the evening.  I cautioned him not to scratch at the dry skin because this could cause secondary infections.  As long as this is controlling his leg pain, I would not institute any new medication.  07/03/18 Patient presents today with severe bilateral leg pain left greater than right.  He has been wearing his compression hose up until the last 3 to 4  days.  At that time, the legs began to itch and peel and the patient discontinue the compression hose.  And the swelling began in the neuropathic pain in both legs intensified now the dorsums of both feet are intensely red, hot to the touch, and painful to the touch.  This suggests a secondary cellulitis.  His biggest concern is severe pain in both legs.  The pain is neuropathic in nature.  It is burning and stinging.  The skin on both legs is peeling.  There are numerous fissures in both legs due to the fluctuating edema where the skin has split open.  Patient is  scratching constantly in both legs.  He is also washing the legs with rubbing alcohol which only intensifies the pain.  Recently he tried Vaseline which helps some. Past Medical History:  Diagnosis Date  . Acid reflux   . Cancer ([place]) 09/05/13   left neck-non-hodgkins lymphoma  . Hypertension    requires diuretic  . Hypothyroidism   . Malaria    while in the service  . Peripheral vascular disease ([place])    slow wound healing on legs   Past Surgical History:  Procedure Laterality Date  . AMPUTATION Left 06/25/2015   Procedure: LEFT  HALLUX AMPUTATION,;  Surgeon: Wylene Simmer, MD;  Location: [facility];  Service: Orthopedics;  Laterality: Left;  LOCAL/MAC  . MASS BIOPSY Left 09/05/2013   Procedure: EXCISIONAL BIOPSY OF LEFT NECK MASS;  Surgeon: Jerrell Belfast, MD;  Location: [facility];  Service: ENT;  Laterality: Left;  . TONSILLECTOMY    . YAG LASER APPLICATION Right 4/00/8676   Procedure: YAG LASER APPLICATION;  Surgeon: [name], MD;  Location: [facility];  Service: Ophthalmology;  Laterality: Right;   Current Outpatient Medications on File Prior to Visit  Medication Sig Dispense Refill  . amLODipine (NORVASC) 5 MG tablet Take 1 tablet (5 mg total) by mouth daily. 90 tablet 3  . aspirin EC 81 MG tablet Take 1 tablet (81 mg total) by mouth daily.    . betamethasone dipropionate (DIPROLENE) 0.05 % cream Apply topically 2 (two) times daily. 45 g 1  . enalapril (VASOTEC) 20 MG tablet TAKE 1 TABLET BY MOUTH  EVERY DAY 90 tablet 3  . furosemide (LASIX) 20 MG tablet TAKE 2 TABLET BY MOUTH  EVERY DAY 90 tablet 1  . gabapentin (NEURONTIN) 100 MG capsule TAKE 2 CAPSULES EVERY  MORNING 180 capsule 4  . gabapentin (NEURONTIN) 300 MG capsule TAKE 1 CAPSULE BY MOUTH AT  BEDTIME 90 capsule 4  . HYDROcodone-acetaminophen (NORCO) 5-325 MG tablet Take 1 tablet by mouth every 6 (six) hours as needed for moderate pain. 30 tablet 0  . levothyroxine (SYNTHROID, LEVOTHROID) 112 MCG tablet Take 1  tablet (112 mcg total) by mouth daily. 90 tablet 3  . mometasone (ELOCON) 0.1 % ointment APPLY TO AFFECTED AREA EVERY DAY  0  . traMADol (ULTRAM) 50 MG tablet TAKE 1 TABLET EVERY 8 HOURS AS NEEDED FOR PAIN 30 tablet 1  . triamcinolone cream (KENALOG) 0.1 % APPLY TO AFFECTED AREA TWICE A DAY FOR 2-4 WEEKS AS NEEDED FOR FLARES  1  . vitamin B-12 (CYANOCOBALAMIN) 1000 MCG tablet Take 1 tablet (1,000 mcg total) by mouth daily. 30 tablet 5  . [DISCONTINUED] famotidine (PEPCID) 20 MG tablet Take 20 mg by mouth 2 (two) times daily.     No current facility-administered medications on file prior to visit.    Allergies  Allergen Reactions  . Codeine  Nausea And Vomiting  . Hydrocodone Nausea And Vomiting  . Percocet [Oxycodone-Acetaminophen] Nausea And Vomiting   Social History   Socioeconomic History  . Marital status: Married    Spouse name: Not on file  . Number of children: 4  . Years of education: Not on file  . Highest education level: Not on file  Occupational History  . Occupation: retired  Scientific laboratory technician  . Financial resource strain: Not on file  . Food insecurity:    Worry: Not on file    Inability: Not on file  . Transportation needs:    Medical: Not on file    Non-medical: Not on file  Tobacco Use  . Smoking status: Former Smoker    Types: Cigars  . Smokeless tobacco: Former Systems developer    Quit date: 10/05/1979  Substance and Sexual Activity  . Alcohol use: No  . Drug use: No  . Sexual activity: Not on file  Lifestyle  . Physical activity:    Days per week: Not on file    Minutes per session: Not on file  . Stress: Not on file  Relationships  . Social connections:    Talks on phone: Not on file    Gets together: Not on file    Attends religious service: Not on file    Active member of club or organization: Not on file    Attends meetings of clubs or organizations: Not on file    Relationship status: Not on file  . Intimate partner violence:    Fear of current or ex  partner: Not on file    Emotionally abused: Not on file    Physically abused: Not on file    Forced sexual activity: Not on file  Other Topics Concern  . Not on file  Social History Narrative  . Not on file      Review of Systems  Musculoskeletal: Positive for back pain.  All other systems reviewed and are negative.      Objective:   Physical Exam  Constitutional: He appears well-developed and well-nourished. No distress.  Cardiovascular: Normal rate, regular rhythm and normal heart sounds. Exam reveals no gallop and no friction rub.  No murmur heard. Pulmonary/Chest: Effort normal and breath sounds normal. No respiratory distress. He has no wheezes. He has no rales. He exhibits no tenderness.  Abdominal: Soft. He exhibits no distension. There is no tenderness. There is no rebound and no guarding.  Musculoskeletal: He exhibits edema.       Feet:  Skin: Rash noted. Rash is maculopapular. He is not diaphoretic. There is erythema.     Vitals reviewed.        Assessment & Plan:  Venous stasis dermatitis of both lower extremities  Cellulitis of left lower extremity  Cellulitis of right lower extremity  Atopic neurodermatitis  Leg pain is due to a combination of factors.  His chronic venous insufficiency as cause leg swelling.  The edema in the legs is because the skin to split into fissure.  This exacerbates his venous stasis dermatitis.  This triggers and atopic neurodermatitis with itching and scratching subsequently causing the skin to develop a secondary cellulitis.  Therefore I will treat these conditions simultaneously to affect improvement.  I will start him on Keflex 500 mg p.o. 3 times daily for 7 days.  Both legs were placed in Unna boots to control the venous insufficiency and help with venous stasis dermatitis.  Recheck in 2 days and at that time remove the  Unna boots.  Hopefully at that point we can transition to topical corticosteroid creams.  He can use  hydrocodone 5/325 1 p.o. every 6 hours as needed breakthrough neuropathic pain as gabapentin is proving to be ineffective.

## 2018-07-04 ENCOUNTER — Telehealth: Payer: Self-pay | Admitting: Family Medicine

## 2018-07-04 NOTE — Telephone Encounter (Signed)
Becky calling to discuss this patient not being able to walk with you  [phone]

## 2018-07-04 NOTE — Telephone Encounter (Signed)
[name] called back and states that pt is doing better and will just wait until tomorrow to see Dr. Dennard Schaumann.

## 2018-07-05 ENCOUNTER — Ambulatory Visit (INDEPENDENT_AMBULATORY_CARE_PROVIDER_SITE_OTHER): Payer: Medicare Other | Admitting: Family Medicine

## 2018-07-05 ENCOUNTER — Encounter: Payer: Self-pay | Admitting: Family Medicine

## 2018-07-05 VITALS — BP 138/72 | HR 68 | Temp 97.9°F | Resp 18 | Ht 79.0 in

## 2018-07-05 DIAGNOSIS — L2081 Atopic neurodermatitis: Secondary | ICD-10-CM | POA: Diagnosis not present

## 2018-07-05 DIAGNOSIS — I872 Venous insufficiency (chronic) (peripheral): Secondary | ICD-10-CM

## 2018-07-05 DIAGNOSIS — R531 Weakness: Secondary | ICD-10-CM

## 2018-07-05 MED ORDER — MOMETASONE FUROATE 0.1 % EX OINT: TOPICAL_OINTMENT | Freq: Every day | CUTANEOUS | 1 refills | Status: DC

## 2018-07-05 NOTE — Progress Notes (Signed)
Subjective:    Patient ID: [name], male    DOB: Jan 29, 1919, 84 y.o.   MRN: [id number]  04/26/18  Patient is a very pleasant 84 year old Caucasian male here today complaining of severe leg pain and swelling.  Patient has a history of chronic venous insufficiency, pitting edema in both legs, venous stasis dermatitis, and neuropathic leg pain.  After his last visit, obtain venous ultrasounds and ruled out DVT.  Leg swelling in the right leg is worse than swelling in the left leg however pain is equal and symmetric.  He reports burning stinging pain in both legs.  He reports weeping edema, dry cracked fissured skin, aching pain in both legs.  The right foot is fiery red, hot to the touch, and painful.  He does have a history of peripheral vascular disease.  Arterial Dopplers obtained in 2018 revealed: Summary:  - Right ABI indicates normal arterial flow at rest. Doppler waveforms may suggest a falsely elevated pressure. - Left ABI indicates a moderate reduction in arterial flow at rest. Doppler waveforms may suggest a possible elevation in pressures.  At that time, my plan was: Patient has a long-standing history of chronic venous insufficiency and leg swelling.  DVT has been ruled out.  I suspect chronic venous insufficiency causing leg pain, peripheral neuropathy, possibly an element of restless leg syndrome as his pain is worse at rest or when lying down.  Patient was placed in Unna boots bilaterally.  I will start him on Keflex 500 mg p.o. 3 times daily for the cellulitis in the right foot.  I will have him recheck here on Monday with my partner to reevaluate the cellulitis and see if his leg pain is better.  If better, I will see the patient back on Friday after wearing the Unna boots for a full week to discuss chronic therapy moving forward and possibly home health nursing doing this at home.  If pain is no better next week, consider instituting Mirapex or Requip for neuropathic pain and  possible restless leg syndrome on top of his gabapentin.  05/04/18 Here for follow up.  Patient states that his legs feel much better.  He states that they feel like a new pair of legs.  The swelling has completely resolved.  The erythema in his right foot has subsided dramatically.  He denies any pain at night when he lies down now simply by wearing the compression Unna boots.  He denies any burning pain in his legs.  He denies any stinging pain in his legs.  At that time, my plan was: Leg pain seems to be attributed to chronic venous insufficiency, leg swelling, cellulitis, and likely underlying peripheral neuropathy.  I will not treat restless leg syndrome at this time as his symptoms have completely subsided after wearing the Unna boots for 1 week.  I have recommended that he apply Vaseline to both legs every day as an emollient to also help with the dry cracked skin and then apply compression hose/stockings.  I recommended that he wear them every day to help control the swelling and then remove them in the evening.  I cautioned him not to scratch at the dry skin because this could cause secondary infections.  As long as this is controlling his leg pain, I would not institute any new medication.  07/03/18 Patient presents today with severe bilateral leg pain left greater than right.  He has been wearing his compression hose up until the last 3 to 4  days.  At that time, the legs began to itch and peel and the patient discontinue the compression hose.  And the swelling began in the neuropathic pain in both legs intensified now the dorsums of both feet are intensely red, hot to the touch, and painful to the touch.  This suggests a secondary cellulitis.  His biggest concern is severe pain in both legs.  The pain is neuropathic in nature.  It is burning and stinging.  The skin on both legs is peeling.  There are numerous fissures in both legs due to the fluctuating edema where the skin has split open.  Patient is  scratching constantly in both legs.  He is also washing the legs with rubbing alcohol which only intensifies the pain.  Recently he tried Vaseline which helps some.  At that time, my plan was:  Leg pain is due to a combination of factors.  His chronic venous insufficiency as cause leg swelling.  The edema in the legs is because the skin to split into fissure.  This exacerbates his venous stasis dermatitis.  This triggers and atopic neurodermatitis with itching and scratching subsequently causing the skin to develop a secondary cellulitis.  Therefore I will treat these conditions simultaneously to affect improvement.  I will start him on Keflex 500 mg p.o. 3 times daily for 7 days.  Both legs were placed in Unna boots to control the venous insufficiency and help with venous stasis dermatitis.  Recheck in 2 days and at that time remove the Unna boots.  Hopefully at that point we can transition to topical corticosteroid creams.  He can use hydrocodone 5/325 1 p.o. every 6 hours as needed breakthrough neuropathic pain as gabapentin is proving to be ineffective.  8/07/05/18 Patient's Unna boots are removed today.  The erythema is subsiding on the dorsal surfaces of both feet.  The fissures and cracks in his skin have also improved.  There is still chronic venous stasis changes to both legs all the way up to just below his knees.  However there is no weeping edema and there are no open sores.  He states the pain is better.  The pain is neuropathic in nature.  It tends to fluctuate.  At times he is pain-free and at other times he is in severe pain with pain shooting down his leg into his right great toe.  His daughter states that the burning pain in his right great toe is his worst symptom.  Gabapentin 200 mg twice daily and 400 mg at night does not seem to be effective in controlling the neuropathic pain.  She is also requesting physical therapy come to the home to try to help with ambulation and improve his  deconditioning Past Medical History:  Diagnosis Date  . Acid reflux   . Cancer ([place]) 09/05/13   left neck-non-hodgkins lymphoma  . Hypertension    requires diuretic  . Hypothyroidism   . Malaria    while in the service  . Peripheral vascular disease ([place])    slow wound healing on legs   Past Surgical History:  Procedure Laterality Date  . AMPUTATION Left 06/25/2015   Procedure: LEFT  HALLUX AMPUTATION,;  Surgeon: Wylene Simmer, MD;  Location: [facility];  Service: Orthopedics;  Laterality: Left;  LOCAL/MAC  . MASS BIOPSY Left 09/05/2013   Procedure: EXCISIONAL BIOPSY OF LEFT NECK MASS;  Surgeon: Jerrell Belfast, MD;  Location: [facility];  Service: ENT;  Laterality: Left;  . TONSILLECTOMY    .  YAG LASER APPLICATION Right 4/98/2641   Procedure: YAG LASER APPLICATION;  Surgeon: [name], MD;  Location: [facility];  Service: Ophthalmology;  Laterality: Right;   Current Outpatient Medications on File Prior to Visit  Medication Sig Dispense Refill  . amLODipine (NORVASC) 5 MG tablet Take 1 tablet (5 mg total) by mouth daily. 90 tablet 3  . aspirin EC 81 MG tablet Take 1 tablet (81 mg total) by mouth daily.    . betamethasone dipropionate (DIPROLENE) 0.05 % cream Apply topically 2 (two) times daily. 45 g 1  . cephALEXin (KEFLEX) 500 MG capsule Take 1 capsule (500 mg total) by mouth 3 (three) times daily. 21 capsule 0  . enalapril (VASOTEC) 20 MG tablet TAKE 1 TABLET BY MOUTH  EVERY DAY 90 tablet 3  . furosemide (LASIX) 20 MG tablet TAKE 2 TABLET BY MOUTH  EVERY DAY 90 tablet 1  . gabapentin (NEURONTIN) 100 MG capsule TAKE 2 CAPSULES EVERY  MORNING 180 capsule 4  . gabapentin (NEURONTIN) 300 MG capsule TAKE 1 CAPSULE BY MOUTH AT  BEDTIME 90 capsule 4  . HYDROcodone-acetaminophen (NORCO) 5-325 MG tablet Take 1 tablet by mouth every 6 (six) hours as needed for moderate pain. 30 tablet 0  . HYDROcodone-acetaminophen (NORCO) 5-325 MG tablet Take 1 tablet by mouth every 6 (six) hours as  needed for moderate pain. 30 tablet 0  . levothyroxine (SYNTHROID, LEVOTHROID) 112 MCG tablet Take 1 tablet (112 mcg total) by mouth daily. 90 tablet 3  . mometasone (ELOCON) 0.1 % ointment APPLY TO AFFECTED AREA EVERY DAY  0  . traMADol (ULTRAM) 50 MG tablet TAKE 1 TABLET EVERY 8 HOURS AS NEEDED FOR PAIN 30 tablet 1  . triamcinolone cream (KENALOG) 0.1 % APPLY TO AFFECTED AREA TWICE A DAY FOR 2-4 WEEKS AS NEEDED FOR FLARES  1  . vitamin B-12 (CYANOCOBALAMIN) 1000 MCG tablet Take 1 tablet (1,000 mcg total) by mouth daily. 30 tablet 5  . [DISCONTINUED] famotidine (PEPCID) 20 MG tablet Take 20 mg by mouth 2 (two) times daily.     No current facility-administered medications on file prior to visit.    Allergies  Allergen Reactions  . Codeine Nausea And Vomiting  . Hydrocodone Nausea And Vomiting  . Percocet [Oxycodone-Acetaminophen] Nausea And Vomiting   Social History   Socioeconomic History  . Marital status: Married    Spouse name: Not on file  . Number of children: 4  . Years of education: Not on file  . Highest education level: Not on file  Occupational History  . Occupation: retired  Scientific laboratory technician  . Financial resource strain: Not on file  . Food insecurity:    Worry: Not on file    Inability: Not on file  . Transportation needs:    Medical: Not on file    Non-medical: Not on file  Tobacco Use  . Smoking status: Former Smoker    Types: Cigars  . Smokeless tobacco: Former Systems developer    Quit date: 10/05/1979  Substance and Sexual Activity  . Alcohol use: No  . Drug use: No  . Sexual activity: Not on file  Lifestyle  . Physical activity:    Days per week: Not on file    Minutes per session: Not on file  . Stress: Not on file  Relationships  . Social connections:    Talks on phone: Not on file    Gets together: Not on file    Attends religious service: Not on file  Active member of club or organization: Not on file    Attends meetings of clubs or organizations: Not on  file    Relationship status: Not on file  . Intimate partner violence:    Fear of current or ex partner: Not on file    Emotionally abused: Not on file    Physically abused: Not on file    Forced sexual activity: Not on file  Other Topics Concern  . Not on file  Social History Narrative  . Not on file      Review of Systems  All other systems reviewed and are negative.      Objective:   Physical Exam  Constitutional: He appears well-developed and well-nourished. No distress.  Cardiovascular: Normal rate, regular rhythm and normal heart sounds. Exam reveals no gallop and no friction rub.  No murmur heard. Pulmonary/Chest: Effort normal and breath sounds normal. No respiratory distress. He has no wheezes. He has no rales. He exhibits no tenderness.  Abdominal: Soft. He exhibits no distension. There is no tenderness. There is no rebound and no guarding.  Musculoskeletal: He exhibits edema.       Feet:  Skin: Rash noted. Rash is maculopapular. He is not diaphoretic. There is erythema.     Vitals reviewed.   Previous maculopapular rash has improved.  Erythema has improved.  Edema has resolved.     Assessment & Plan:  Venous stasis dermatitis of both lower extremities  Atopic neurodermatitis With regards to the venous stasis dermatitis and atopic neurodermatitis in both legs, patient can apply Elocon ointment 1-2 times every day to help with the dry peeling cracking skin and itching and burning that comes with it.  However the majority of his pain is due to peripheral neuropathy.  Gabapentin does not seem to be as effective in controlling his pain.  Daughter states that when he takes hydrocodone, he does not experience any sedation, dizziness, or altered mental status.  Therefore I recommended that we increase his hydrocodone to 5/325 1 p.o. twice daily as needed.  He was already taking 1 pill at night.  If he takes 1 pill twice a day, I just recommended that his daughter give  him MiraLAX to avoid constipation.  Complete the antibiotics for the cellulitis which seems to be improving slowly.  With regards to his physical deconditioning, I will consult home health physical therapy.

## 2018-07-12 DIAGNOSIS — R531 Weakness: Secondary | ICD-10-CM | POA: Diagnosis not present

## 2018-07-12 DIAGNOSIS — Z7982 Long term (current) use of aspirin: Secondary | ICD-10-CM | POA: Diagnosis not present

## 2018-07-12 DIAGNOSIS — Z8572 Personal history of non-Hodgkin lymphomas: Secondary | ICD-10-CM | POA: Diagnosis not present

## 2018-07-12 DIAGNOSIS — L2081 Atopic neurodermatitis: Secondary | ICD-10-CM | POA: Diagnosis not present

## 2018-07-12 DIAGNOSIS — E039 Hypothyroidism, unspecified: Secondary | ICD-10-CM | POA: Diagnosis not present

## 2018-07-12 DIAGNOSIS — I872 Venous insufficiency (chronic) (peripheral): Secondary | ICD-10-CM | POA: Diagnosis not present

## 2018-07-12 DIAGNOSIS — Z87891 Personal history of nicotine dependence: Secondary | ICD-10-CM | POA: Diagnosis not present

## 2018-07-12 DIAGNOSIS — I739 Peripheral vascular disease, unspecified: Secondary | ICD-10-CM | POA: Diagnosis not present

## 2018-07-12 DIAGNOSIS — I10 Essential (primary) hypertension: Secondary | ICD-10-CM | POA: Diagnosis not present

## 2018-07-18 DIAGNOSIS — I872 Venous insufficiency (chronic) (peripheral): Secondary | ICD-10-CM | POA: Diagnosis not present

## 2018-07-18 DIAGNOSIS — I739 Peripheral vascular disease, unspecified: Secondary | ICD-10-CM | POA: Diagnosis not present

## 2018-07-18 DIAGNOSIS — Z7982 Long term (current) use of aspirin: Secondary | ICD-10-CM | POA: Diagnosis not present

## 2018-07-18 DIAGNOSIS — R531 Weakness: Secondary | ICD-10-CM | POA: Diagnosis not present

## 2018-07-18 DIAGNOSIS — L2081 Atopic neurodermatitis: Secondary | ICD-10-CM | POA: Diagnosis not present

## 2018-07-18 DIAGNOSIS — I10 Essential (primary) hypertension: Secondary | ICD-10-CM | POA: Diagnosis not present

## 2018-07-18 DIAGNOSIS — E039 Hypothyroidism, unspecified: Secondary | ICD-10-CM | POA: Diagnosis not present

## 2018-07-18 DIAGNOSIS — Z8572 Personal history of non-Hodgkin lymphomas: Secondary | ICD-10-CM | POA: Diagnosis not present

## 2018-07-18 DIAGNOSIS — Z87891 Personal history of nicotine dependence: Secondary | ICD-10-CM | POA: Diagnosis not present

## 2018-07-20 DIAGNOSIS — I872 Venous insufficiency (chronic) (peripheral): Secondary | ICD-10-CM | POA: Diagnosis not present

## 2018-07-20 DIAGNOSIS — Z87891 Personal history of nicotine dependence: Secondary | ICD-10-CM | POA: Diagnosis not present

## 2018-07-20 DIAGNOSIS — Z7982 Long term (current) use of aspirin: Secondary | ICD-10-CM | POA: Diagnosis not present

## 2018-07-20 DIAGNOSIS — I10 Essential (primary) hypertension: Secondary | ICD-10-CM | POA: Diagnosis not present

## 2018-07-20 DIAGNOSIS — Z8572 Personal history of non-Hodgkin lymphomas: Secondary | ICD-10-CM | POA: Diagnosis not present

## 2018-07-20 DIAGNOSIS — I739 Peripheral vascular disease, unspecified: Secondary | ICD-10-CM | POA: Diagnosis not present

## 2018-07-20 DIAGNOSIS — L2081 Atopic neurodermatitis: Secondary | ICD-10-CM | POA: Diagnosis not present

## 2018-07-20 DIAGNOSIS — E039 Hypothyroidism, unspecified: Secondary | ICD-10-CM | POA: Diagnosis not present

## 2018-07-20 DIAGNOSIS — R531 Weakness: Secondary | ICD-10-CM | POA: Diagnosis not present

## 2018-07-24 DIAGNOSIS — Z87891 Personal history of nicotine dependence: Secondary | ICD-10-CM | POA: Diagnosis not present

## 2018-07-24 DIAGNOSIS — Z8572 Personal history of non-Hodgkin lymphomas: Secondary | ICD-10-CM | POA: Diagnosis not present

## 2018-07-24 DIAGNOSIS — L2081 Atopic neurodermatitis: Secondary | ICD-10-CM | POA: Diagnosis not present

## 2018-07-24 DIAGNOSIS — Z7982 Long term (current) use of aspirin: Secondary | ICD-10-CM | POA: Diagnosis not present

## 2018-07-24 DIAGNOSIS — I872 Venous insufficiency (chronic) (peripheral): Secondary | ICD-10-CM | POA: Diagnosis not present

## 2018-07-24 DIAGNOSIS — I739 Peripheral vascular disease, unspecified: Secondary | ICD-10-CM | POA: Diagnosis not present

## 2018-07-24 DIAGNOSIS — E039 Hypothyroidism, unspecified: Secondary | ICD-10-CM | POA: Diagnosis not present

## 2018-07-24 DIAGNOSIS — R531 Weakness: Secondary | ICD-10-CM | POA: Diagnosis not present

## 2018-07-24 DIAGNOSIS — I10 Essential (primary) hypertension: Secondary | ICD-10-CM | POA: Diagnosis not present

## 2018-07-27 DIAGNOSIS — I739 Peripheral vascular disease, unspecified: Secondary | ICD-10-CM | POA: Diagnosis not present

## 2018-07-27 DIAGNOSIS — Z87891 Personal history of nicotine dependence: Secondary | ICD-10-CM | POA: Diagnosis not present

## 2018-07-27 DIAGNOSIS — R531 Weakness: Secondary | ICD-10-CM | POA: Diagnosis not present

## 2018-07-27 DIAGNOSIS — I872 Venous insufficiency (chronic) (peripheral): Secondary | ICD-10-CM | POA: Diagnosis not present

## 2018-07-27 DIAGNOSIS — Z8572 Personal history of non-Hodgkin lymphomas: Secondary | ICD-10-CM | POA: Diagnosis not present

## 2018-07-27 DIAGNOSIS — L2081 Atopic neurodermatitis: Secondary | ICD-10-CM | POA: Diagnosis not present

## 2018-07-27 DIAGNOSIS — Z7982 Long term (current) use of aspirin: Secondary | ICD-10-CM | POA: Diagnosis not present

## 2018-07-27 DIAGNOSIS — E039 Hypothyroidism, unspecified: Secondary | ICD-10-CM | POA: Diagnosis not present

## 2018-07-27 DIAGNOSIS — I10 Essential (primary) hypertension: Secondary | ICD-10-CM | POA: Diagnosis not present

## 2018-07-31 DIAGNOSIS — I872 Venous insufficiency (chronic) (peripheral): Secondary | ICD-10-CM | POA: Diagnosis not present

## 2018-07-31 DIAGNOSIS — Z7982 Long term (current) use of aspirin: Secondary | ICD-10-CM | POA: Diagnosis not present

## 2018-07-31 DIAGNOSIS — L2081 Atopic neurodermatitis: Secondary | ICD-10-CM | POA: Diagnosis not present

## 2018-07-31 DIAGNOSIS — I739 Peripheral vascular disease, unspecified: Secondary | ICD-10-CM | POA: Diagnosis not present

## 2018-07-31 DIAGNOSIS — Z87891 Personal history of nicotine dependence: Secondary | ICD-10-CM | POA: Diagnosis not present

## 2018-07-31 DIAGNOSIS — R531 Weakness: Secondary | ICD-10-CM | POA: Diagnosis not present

## 2018-07-31 DIAGNOSIS — Z8572 Personal history of non-Hodgkin lymphomas: Secondary | ICD-10-CM | POA: Diagnosis not present

## 2018-07-31 DIAGNOSIS — E039 Hypothyroidism, unspecified: Secondary | ICD-10-CM | POA: Diagnosis not present

## 2018-07-31 DIAGNOSIS — I10 Essential (primary) hypertension: Secondary | ICD-10-CM | POA: Diagnosis not present

## 2018-08-03 DIAGNOSIS — E039 Hypothyroidism, unspecified: Secondary | ICD-10-CM | POA: Diagnosis not present

## 2018-08-03 DIAGNOSIS — I10 Essential (primary) hypertension: Secondary | ICD-10-CM | POA: Diagnosis not present

## 2018-08-03 DIAGNOSIS — R531 Weakness: Secondary | ICD-10-CM | POA: Diagnosis not present

## 2018-08-03 DIAGNOSIS — Z87891 Personal history of nicotine dependence: Secondary | ICD-10-CM | POA: Diagnosis not present

## 2018-08-03 DIAGNOSIS — I872 Venous insufficiency (chronic) (peripheral): Secondary | ICD-10-CM | POA: Diagnosis not present

## 2018-08-03 DIAGNOSIS — Z8572 Personal history of non-Hodgkin lymphomas: Secondary | ICD-10-CM | POA: Diagnosis not present

## 2018-08-03 DIAGNOSIS — Z7982 Long term (current) use of aspirin: Secondary | ICD-10-CM | POA: Diagnosis not present

## 2018-08-03 DIAGNOSIS — L2081 Atopic neurodermatitis: Secondary | ICD-10-CM | POA: Diagnosis not present

## 2018-08-03 DIAGNOSIS — I739 Peripheral vascular disease, unspecified: Secondary | ICD-10-CM | POA: Diagnosis not present

## 2018-08-07 ENCOUNTER — Telehealth: Payer: Self-pay | Admitting: Family Medicine

## 2018-08-07 DIAGNOSIS — Z87891 Personal history of nicotine dependence: Secondary | ICD-10-CM | POA: Diagnosis not present

## 2018-08-07 DIAGNOSIS — E039 Hypothyroidism, unspecified: Secondary | ICD-10-CM | POA: Diagnosis not present

## 2018-08-07 DIAGNOSIS — L2081 Atopic neurodermatitis: Secondary | ICD-10-CM | POA: Diagnosis not present

## 2018-08-07 DIAGNOSIS — R531 Weakness: Secondary | ICD-10-CM | POA: Diagnosis not present

## 2018-08-07 DIAGNOSIS — I10 Essential (primary) hypertension: Secondary | ICD-10-CM | POA: Diagnosis not present

## 2018-08-07 DIAGNOSIS — I739 Peripheral vascular disease, unspecified: Secondary | ICD-10-CM | POA: Diagnosis not present

## 2018-08-07 DIAGNOSIS — Z7982 Long term (current) use of aspirin: Secondary | ICD-10-CM | POA: Diagnosis not present

## 2018-08-07 DIAGNOSIS — I872 Venous insufficiency (chronic) (peripheral): Secondary | ICD-10-CM | POA: Diagnosis not present

## 2018-08-07 DIAGNOSIS — Z8572 Personal history of non-Hodgkin lymphomas: Secondary | ICD-10-CM | POA: Diagnosis not present

## 2018-08-07 NOTE — Telephone Encounter (Signed)
Agreed.  No eval needed unless symptoms continue or worsen.

## 2018-08-07 NOTE — Telephone Encounter (Signed)
Patient's daughter called stating that PT with [facility] came out to see the patient today and prior to their arrival the patient got up to eat and was weak and felt like he was going to pass out. He did not actually have a fall but just had weakness . PT told them it may have been due to patient not eating, or drinking much. States that they only wanted to make our office aware.

## 2018-08-09 DIAGNOSIS — I872 Venous insufficiency (chronic) (peripheral): Secondary | ICD-10-CM | POA: Diagnosis not present

## 2018-08-09 DIAGNOSIS — Z87891 Personal history of nicotine dependence: Secondary | ICD-10-CM | POA: Diagnosis not present

## 2018-08-09 DIAGNOSIS — I10 Essential (primary) hypertension: Secondary | ICD-10-CM | POA: Diagnosis not present

## 2018-08-09 DIAGNOSIS — L2081 Atopic neurodermatitis: Secondary | ICD-10-CM | POA: Diagnosis not present

## 2018-08-09 DIAGNOSIS — Z8572 Personal history of non-Hodgkin lymphomas: Secondary | ICD-10-CM | POA: Diagnosis not present

## 2018-08-09 DIAGNOSIS — E039 Hypothyroidism, unspecified: Secondary | ICD-10-CM | POA: Diagnosis not present

## 2018-08-09 DIAGNOSIS — Z7982 Long term (current) use of aspirin: Secondary | ICD-10-CM | POA: Diagnosis not present

## 2018-08-09 DIAGNOSIS — R531 Weakness: Secondary | ICD-10-CM | POA: Diagnosis not present

## 2018-08-09 DIAGNOSIS — I739 Peripheral vascular disease, unspecified: Secondary | ICD-10-CM | POA: Diagnosis not present

## 2018-08-14 DIAGNOSIS — Z7982 Long term (current) use of aspirin: Secondary | ICD-10-CM | POA: Diagnosis not present

## 2018-08-14 DIAGNOSIS — I10 Essential (primary) hypertension: Secondary | ICD-10-CM | POA: Diagnosis not present

## 2018-08-14 DIAGNOSIS — R531 Weakness: Secondary | ICD-10-CM | POA: Diagnosis not present

## 2018-08-14 DIAGNOSIS — L2081 Atopic neurodermatitis: Secondary | ICD-10-CM | POA: Diagnosis not present

## 2018-08-14 DIAGNOSIS — I739 Peripheral vascular disease, unspecified: Secondary | ICD-10-CM | POA: Diagnosis not present

## 2018-08-14 DIAGNOSIS — E039 Hypothyroidism, unspecified: Secondary | ICD-10-CM | POA: Diagnosis not present

## 2018-08-14 DIAGNOSIS — Z87891 Personal history of nicotine dependence: Secondary | ICD-10-CM | POA: Diagnosis not present

## 2018-08-14 DIAGNOSIS — Z8572 Personal history of non-Hodgkin lymphomas: Secondary | ICD-10-CM | POA: Diagnosis not present

## 2018-08-14 DIAGNOSIS — I872 Venous insufficiency (chronic) (peripheral): Secondary | ICD-10-CM | POA: Diagnosis not present

## 2018-08-16 DIAGNOSIS — L2081 Atopic neurodermatitis: Secondary | ICD-10-CM | POA: Diagnosis not present

## 2018-08-16 DIAGNOSIS — I872 Venous insufficiency (chronic) (peripheral): Secondary | ICD-10-CM | POA: Diagnosis not present

## 2018-08-16 DIAGNOSIS — I10 Essential (primary) hypertension: Secondary | ICD-10-CM | POA: Diagnosis not present

## 2018-08-16 DIAGNOSIS — E039 Hypothyroidism, unspecified: Secondary | ICD-10-CM | POA: Diagnosis not present

## 2018-08-16 DIAGNOSIS — Z8572 Personal history of non-Hodgkin lymphomas: Secondary | ICD-10-CM | POA: Diagnosis not present

## 2018-08-16 DIAGNOSIS — I739 Peripheral vascular disease, unspecified: Secondary | ICD-10-CM | POA: Diagnosis not present

## 2018-08-16 DIAGNOSIS — Z87891 Personal history of nicotine dependence: Secondary | ICD-10-CM | POA: Diagnosis not present

## 2018-08-16 DIAGNOSIS — R531 Weakness: Secondary | ICD-10-CM | POA: Diagnosis not present

## 2018-08-16 DIAGNOSIS — Z7982 Long term (current) use of aspirin: Secondary | ICD-10-CM | POA: Diagnosis not present

## 2018-08-22 DIAGNOSIS — I872 Venous insufficiency (chronic) (peripheral): Secondary | ICD-10-CM | POA: Diagnosis not present

## 2018-08-22 DIAGNOSIS — Z8572 Personal history of non-Hodgkin lymphomas: Secondary | ICD-10-CM | POA: Diagnosis not present

## 2018-08-22 DIAGNOSIS — I10 Essential (primary) hypertension: Secondary | ICD-10-CM | POA: Diagnosis not present

## 2018-08-22 DIAGNOSIS — R531 Weakness: Secondary | ICD-10-CM | POA: Diagnosis not present

## 2018-08-22 DIAGNOSIS — Z87891 Personal history of nicotine dependence: Secondary | ICD-10-CM | POA: Diagnosis not present

## 2018-08-22 DIAGNOSIS — E039 Hypothyroidism, unspecified: Secondary | ICD-10-CM | POA: Diagnosis not present

## 2018-08-22 DIAGNOSIS — I739 Peripheral vascular disease, unspecified: Secondary | ICD-10-CM | POA: Diagnosis not present

## 2018-08-22 DIAGNOSIS — Z7982 Long term (current) use of aspirin: Secondary | ICD-10-CM | POA: Diagnosis not present

## 2018-08-22 DIAGNOSIS — L2081 Atopic neurodermatitis: Secondary | ICD-10-CM | POA: Diagnosis not present

## 2018-08-24 DIAGNOSIS — R531 Weakness: Secondary | ICD-10-CM | POA: Diagnosis not present

## 2018-08-24 DIAGNOSIS — Z8572 Personal history of non-Hodgkin lymphomas: Secondary | ICD-10-CM | POA: Diagnosis not present

## 2018-08-24 DIAGNOSIS — I739 Peripheral vascular disease, unspecified: Secondary | ICD-10-CM | POA: Diagnosis not present

## 2018-08-24 DIAGNOSIS — I10 Essential (primary) hypertension: Secondary | ICD-10-CM | POA: Diagnosis not present

## 2018-08-24 DIAGNOSIS — L2081 Atopic neurodermatitis: Secondary | ICD-10-CM | POA: Diagnosis not present

## 2018-08-24 DIAGNOSIS — Z87891 Personal history of nicotine dependence: Secondary | ICD-10-CM | POA: Diagnosis not present

## 2018-08-24 DIAGNOSIS — E039 Hypothyroidism, unspecified: Secondary | ICD-10-CM | POA: Diagnosis not present

## 2018-08-24 DIAGNOSIS — I872 Venous insufficiency (chronic) (peripheral): Secondary | ICD-10-CM | POA: Diagnosis not present

## 2018-08-24 DIAGNOSIS — Z7982 Long term (current) use of aspirin: Secondary | ICD-10-CM | POA: Diagnosis not present

## 2018-09-17 DIAGNOSIS — H35433 Paving stone degeneration of retina, bilateral: Secondary | ICD-10-CM | POA: Diagnosis not present

## 2018-09-17 DIAGNOSIS — H353233 Exudative age-related macular degeneration, bilateral, with inactive scar: Secondary | ICD-10-CM | POA: Diagnosis not present

## 2018-09-17 DIAGNOSIS — H43813 Vitreous degeneration, bilateral: Secondary | ICD-10-CM | POA: Diagnosis not present

## 2018-09-23 ENCOUNTER — Other Ambulatory Visit: Payer: Self-pay | Admitting: Family Medicine

## 2018-11-06 ENCOUNTER — Other Ambulatory Visit: Payer: Self-pay

## 2018-11-06 ENCOUNTER — Emergency Department (HOSPITAL_COMMUNITY): Payer: Medicare Other

## 2018-11-06 ENCOUNTER — Inpatient Hospital Stay (HOSPITAL_COMMUNITY)
Admission: EM | Admit: 2018-11-06 | Discharge: 2018-11-13 | DRG: 419 | Disposition: A | Discharge status: Skilled Nursing Facility | Payer: Medicare Other | Attending: Internal Medicine | Admitting: Internal Medicine

## 2018-11-06 ENCOUNTER — Encounter (HOSPITAL_COMMUNITY): Payer: Self-pay | Admitting: *Deleted

## 2018-11-06 DIAGNOSIS — N183 Chronic kidney disease, stage 3 unspecified: Secondary | ICD-10-CM | POA: Diagnosis present

## 2018-11-06 DIAGNOSIS — I739 Peripheral vascular disease, unspecified: Secondary | ICD-10-CM | POA: Diagnosis present

## 2018-11-06 DIAGNOSIS — R945 Abnormal results of liver function studies: Secondary | ICD-10-CM | POA: Diagnosis present

## 2018-11-06 DIAGNOSIS — D696 Thrombocytopenia, unspecified: Secondary | ICD-10-CM | POA: Diagnosis present

## 2018-11-06 DIAGNOSIS — K8000 Calculus of gallbladder with acute cholecystitis without obstruction: Secondary | ICD-10-CM | POA: Diagnosis not present

## 2018-11-06 DIAGNOSIS — I10 Essential (primary) hypertension: Secondary | ICD-10-CM | POA: Diagnosis present

## 2018-11-06 DIAGNOSIS — I129 Hypertensive chronic kidney disease with stage 1 through stage 4 chronic kidney disease, or unspecified chronic kidney disease: Secondary | ICD-10-CM | POA: Diagnosis not present

## 2018-11-06 DIAGNOSIS — Z743 Need for continuous supervision: Secondary | ICD-10-CM | POA: Diagnosis not present

## 2018-11-06 DIAGNOSIS — R1011 Right upper quadrant pain: Secondary | ICD-10-CM | POA: Diagnosis not present

## 2018-11-06 DIAGNOSIS — G8929 Other chronic pain: Secondary | ICD-10-CM | POA: Diagnosis not present

## 2018-11-06 DIAGNOSIS — C8590 Non-Hodgkin lymphoma, unspecified, unspecified site: Secondary | ICD-10-CM | POA: Diagnosis present

## 2018-11-06 DIAGNOSIS — K219 Gastro-esophageal reflux disease without esophagitis: Secondary | ICD-10-CM | POA: Diagnosis present

## 2018-11-06 DIAGNOSIS — E860 Dehydration: Secondary | ICD-10-CM | POA: Diagnosis not present

## 2018-11-06 DIAGNOSIS — K810 Acute cholecystitis: Principal | ICD-10-CM | POA: Diagnosis present

## 2018-11-06 DIAGNOSIS — Z89412 Acquired absence of left great toe: Secondary | ICD-10-CM

## 2018-11-06 DIAGNOSIS — E039 Hypothyroidism, unspecified: Secondary | ICD-10-CM | POA: Diagnosis present

## 2018-11-06 DIAGNOSIS — Z8249 Family history of ischemic heart disease and other diseases of the circulatory system: Secondary | ICD-10-CM

## 2018-11-06 DIAGNOSIS — Z79891 Long term (current) use of opiate analgesic: Secondary | ICD-10-CM | POA: Diagnosis not present

## 2018-11-06 DIAGNOSIS — K449 Diaphragmatic hernia without obstruction or gangrene: Secondary | ICD-10-CM | POA: Diagnosis not present

## 2018-11-06 DIAGNOSIS — Z79899 Other long term (current) drug therapy: Secondary | ICD-10-CM

## 2018-11-06 DIAGNOSIS — Z419 Encounter for procedure for purposes other than remedying health state, unspecified: Secondary | ICD-10-CM

## 2018-11-06 DIAGNOSIS — Z79890 Hormone replacement therapy (postmenopausal): Secondary | ICD-10-CM

## 2018-11-06 DIAGNOSIS — Z8613 Personal history of malaria: Secondary | ICD-10-CM | POA: Diagnosis not present

## 2018-11-06 DIAGNOSIS — R509 Fever, unspecified: Secondary | ICD-10-CM | POA: Diagnosis not present

## 2018-11-06 DIAGNOSIS — Z66 Do not resuscitate: Secondary | ICD-10-CM | POA: Diagnosis not present

## 2018-11-06 DIAGNOSIS — J9811 Atelectasis: Secondary | ICD-10-CM | POA: Diagnosis not present

## 2018-11-06 DIAGNOSIS — K8020 Calculus of gallbladder without cholecystitis without obstruction: Secondary | ICD-10-CM | POA: Diagnosis present

## 2018-11-06 DIAGNOSIS — Z885 Allergy status to narcotic agent status: Secondary | ICD-10-CM | POA: Diagnosis not present

## 2018-11-06 DIAGNOSIS — R0689 Other abnormalities of breathing: Secondary | ICD-10-CM | POA: Diagnosis not present

## 2018-11-06 DIAGNOSIS — I872 Venous insufficiency (chronic) (peripheral): Secondary | ICD-10-CM | POA: Diagnosis present

## 2018-11-06 DIAGNOSIS — Z923 Personal history of irradiation: Secondary | ICD-10-CM | POA: Diagnosis not present

## 2018-11-06 DIAGNOSIS — R0602 Shortness of breath: Secondary | ICD-10-CM | POA: Diagnosis not present

## 2018-11-06 DIAGNOSIS — Z823 Family history of stroke: Secondary | ICD-10-CM

## 2018-11-06 DIAGNOSIS — G629 Polyneuropathy, unspecified: Secondary | ICD-10-CM | POA: Diagnosis present

## 2018-11-06 DIAGNOSIS — Z7982 Long term (current) use of aspirin: Secondary | ICD-10-CM

## 2018-11-06 DIAGNOSIS — K5900 Constipation, unspecified: Secondary | ICD-10-CM | POA: Diagnosis present

## 2018-11-06 DIAGNOSIS — R404 Transient alteration of awareness: Secondary | ICD-10-CM | POA: Diagnosis not present

## 2018-11-06 DIAGNOSIS — Z87891 Personal history of nicotine dependence: Secondary | ICD-10-CM

## 2018-11-06 DIAGNOSIS — R52 Pain, unspecified: Secondary | ICD-10-CM | POA: Diagnosis not present

## 2018-11-06 DIAGNOSIS — N184 Chronic kidney disease, stage 4 (severe): Secondary | ICD-10-CM | POA: Diagnosis present

## 2018-11-06 DIAGNOSIS — Z8572 Personal history of non-Hodgkin lymphomas: Secondary | ICD-10-CM

## 2018-11-06 LAB — CBC WITH DIFFERENTIAL/PLATELET
Abs Immature Granulocytes: 0.02 10*3/uL (ref 0.00–0.07)
Basophils Absolute: 0 10*3/uL (ref 0.0–0.1)
Basophils Relative: 0 %
Eosinophils Absolute: 0 10*3/uL (ref 0.0–0.5)
Eosinophils Relative: 0 %
HCT: 45.4 % (ref 39.0–52.0)
Hemoglobin: 14.4 g/dL (ref 13.0–17.0)
Immature Granulocytes: 1 %
Lymphocytes Relative: 11 %
Lymphs Abs: 0.5 10*3/uL — ABNORMAL LOW (ref 0.7–4.0)
MCH: 30.6 pg (ref 26.0–34.0)
MCHC: 31.7 g/dL (ref 30.0–36.0)
MCV: 96.6 fL (ref 80.0–100.0)
Monocytes Absolute: 0.4 10*3/uL (ref 0.1–1.0)
Monocytes Relative: 9 %
Neutro Abs: 3.4 10*3/uL (ref 1.7–7.7)
Neutrophils Relative %: 79 %
Platelets: 134 10*3/uL — ABNORMAL LOW (ref 150–400)
RBC: 4.7 MIL/uL (ref 4.22–5.81)
RDW: 13.4 % (ref 11.5–15.5)
WBC: 4.3 10*3/uL (ref 4.0–10.5)
nRBC: 0 % (ref 0.0–0.2)

## 2018-11-06 LAB — COMPREHENSIVE METABOLIC PANEL
ALT: 421 U/L — ABNORMAL HIGH (ref 0–44)
AST: 744 U/L — ABNORMAL HIGH (ref 15–41)
Albumin: 4.3 g/dL (ref 3.5–5.0)
Alkaline Phosphatase: 103 U/L (ref 38–126)
Anion gap: 12 (ref 5–15)
BUN: 22 mg/dL (ref 8–23)
CO2: 21 mmol/L — ABNORMAL LOW (ref 22–32)
Calcium: 9 mg/dL (ref 8.9–10.3)
Chloride: 109 mmol/L (ref 98–111)
Creatinine, Ser: 1.2 mg/dL (ref 0.61–1.24)
GFR calc Af Amer: 58 mL/min — ABNORMAL LOW (ref >60)
GFR calc non Af Amer: 50 mL/min — ABNORMAL LOW (ref >60)
Glucose, Bld: 144 mg/dL — ABNORMAL HIGH (ref 70–99)
Potassium: 4.3 mmol/L (ref 3.5–5.1)
Sodium: 142 mmol/L (ref 135–145)
Total Bilirubin: 2 mg/dL — ABNORMAL HIGH (ref 0.3–1.2)
Total Protein: 7.4 g/dL (ref 6.5–8.1)

## 2018-11-06 LAB — PROTIME-INR
INR: 1.02
Prothrombin Time: 13.3 seconds (ref 11.4–15.2)

## 2018-11-06 LAB — LIPASE, BLOOD: Lipase: 30 U/L (ref 11–51)

## 2018-11-06 LAB — I-STAT CG4 LACTIC ACID, ED: Lactic Acid, Venous: 0.81 mmol/L (ref 0.5–1.9)

## 2018-11-06 MED ORDER — VANCOMYCIN HCL 10 G IV SOLR
2000.0000 mg | Freq: Once | INTRAVENOUS | Status: AC
  Administered 2018-11-06: 2000 mg via INTRAVENOUS
  Filled 2018-11-06: qty 2000

## 2018-11-06 MED ORDER — VANCOMYCIN HCL IN DEXTROSE 1-5 GM/200ML-% IV SOLN: 1000.0000 mg | Freq: Once | INTRAVENOUS | Status: DC

## 2018-11-06 MED ORDER — METRONIDAZOLE IN NACL 5-0.79 MG/ML-% IV SOLN
500.0000 mg | Freq: Three times a day (TID) | INTRAVENOUS | Status: DC
  Administered 2018-11-06: 500 mg via INTRAVENOUS
  Filled 2018-11-06: qty 100

## 2018-11-06 MED ORDER — SODIUM CHLORIDE (PF) 0.9 % IJ SOLN
INTRAMUSCULAR | Status: AC
  Filled 2018-11-06: qty 50

## 2018-11-06 MED ORDER — IOPAMIDOL (ISOVUE-300) INJECTION 61%
INTRAVENOUS | Status: AC
  Filled 2018-11-06: qty 100

## 2018-11-06 MED ORDER — SODIUM CHLORIDE 0.9 % IV SOLN
2.0000 g | Freq: Once | INTRAVENOUS | Status: AC
  Administered 2018-11-06: 2 g via INTRAVENOUS
  Filled 2018-11-06: qty 2

## 2018-11-06 MED ORDER — IOPAMIDOL (ISOVUE-300) INJECTION 61%
100.0000 mL | Freq: Once | INTRAVENOUS | Status: AC | PRN
  Administered 2018-11-06: 100 mL via INTRAVENOUS

## 2018-11-06 NOTE — Progress Notes (Signed)
A consult was received from an ED physician for vancomycin and cefepime per pharmacy dosing.  The patient's profile has been reviewed for ht/wt/allergies/indication/available labs.    A one time order has been placed for vancomycin 2 gm and cefepime 2 gm.   Further antibiotics/pharmacy consults should be ordered by admitting physician if indicated.                       Thank you, Eudelia Bunch, Pharm.D [phone] 11/06/2018 7:44 PM

## 2018-11-06 NOTE — ED Notes (Signed)
Second blood culture obtained 1903, left AC

## 2018-11-06 NOTE — ED Notes (Signed)
Bed: WA01 Expected date:  Expected time:  Means of arrival:  Comments: [facility] 1

## 2018-11-06 NOTE — ED Triage Notes (Signed)
Per EMS, pt from home, pt reports burning sensation in his chest which is worsening.  Hx of GERD.  Pt was anxious upon arrival on scene.  Denies cp.  EKG per EMS is unremarkable.  Pt verbalizes not feeling good.  Family reports pt started not to feel good around lunch time.  They reports urinary frequency and disorientation.

## 2018-11-07 ENCOUNTER — Encounter (HOSPITAL_COMMUNITY): Payer: Self-pay | Admitting: Internal Medicine

## 2018-11-07 ENCOUNTER — Inpatient Hospital Stay (HOSPITAL_COMMUNITY): Payer: Medicare Other

## 2018-11-07 DIAGNOSIS — R2681 Unsteadiness on feet: Secondary | ICD-10-CM | POA: Diagnosis not present

## 2018-11-07 DIAGNOSIS — R1011 Right upper quadrant pain: Secondary | ICD-10-CM | POA: Diagnosis present

## 2018-11-07 DIAGNOSIS — Z48815 Encounter for surgical aftercare following surgery on the digestive system: Secondary | ICD-10-CM | POA: Diagnosis not present

## 2018-11-07 DIAGNOSIS — R945 Abnormal results of liver function studies: Secondary | ICD-10-CM | POA: Diagnosis present

## 2018-11-07 DIAGNOSIS — K810 Acute cholecystitis: Secondary | ICD-10-CM | POA: Diagnosis present

## 2018-11-07 DIAGNOSIS — D696 Thrombocytopenia, unspecified: Secondary | ICD-10-CM | POA: Diagnosis present

## 2018-11-07 DIAGNOSIS — N184 Chronic kidney disease, stage 4 (severe): Secondary | ICD-10-CM

## 2018-11-07 DIAGNOSIS — I10 Essential (primary) hypertension: Secondary | ICD-10-CM | POA: Diagnosis not present

## 2018-11-07 DIAGNOSIS — Z7982 Long term (current) use of aspirin: Secondary | ICD-10-CM | POA: Diagnosis not present

## 2018-11-07 DIAGNOSIS — K8020 Calculus of gallbladder without cholecystitis without obstruction: Secondary | ICD-10-CM | POA: Diagnosis not present

## 2018-11-07 DIAGNOSIS — C8590 Non-Hodgkin lymphoma, unspecified, unspecified site: Secondary | ICD-10-CM | POA: Diagnosis present

## 2018-11-07 DIAGNOSIS — I129 Hypertensive chronic kidney disease with stage 1 through stage 4 chronic kidney disease, or unspecified chronic kidney disease: Secondary | ICD-10-CM | POA: Diagnosis not present

## 2018-11-07 DIAGNOSIS — Z79899 Other long term (current) drug therapy: Secondary | ICD-10-CM | POA: Diagnosis not present

## 2018-11-07 DIAGNOSIS — G629 Polyneuropathy, unspecified: Secondary | ICD-10-CM | POA: Diagnosis present

## 2018-11-07 DIAGNOSIS — G894 Chronic pain syndrome: Secondary | ICD-10-CM | POA: Diagnosis not present

## 2018-11-07 DIAGNOSIS — K8010 Calculus of gallbladder with chronic cholecystitis without obstruction: Secondary | ICD-10-CM | POA: Diagnosis not present

## 2018-11-07 DIAGNOSIS — Z8613 Personal history of malaria: Secondary | ICD-10-CM | POA: Diagnosis not present

## 2018-11-07 DIAGNOSIS — N183 Chronic kidney disease, stage 3 unspecified: Secondary | ICD-10-CM | POA: Diagnosis present

## 2018-11-07 DIAGNOSIS — I872 Venous insufficiency (chronic) (peripheral): Secondary | ICD-10-CM | POA: Diagnosis not present

## 2018-11-07 DIAGNOSIS — Z79891 Long term (current) use of opiate analgesic: Secondary | ICD-10-CM | POA: Diagnosis not present

## 2018-11-07 DIAGNOSIS — Z923 Personal history of irradiation: Secondary | ICD-10-CM | POA: Diagnosis not present

## 2018-11-07 DIAGNOSIS — K8000 Calculus of gallbladder with acute cholecystitis without obstruction: Secondary | ICD-10-CM | POA: Diagnosis not present

## 2018-11-07 DIAGNOSIS — K219 Gastro-esophageal reflux disease without esophagitis: Secondary | ICD-10-CM | POA: Diagnosis not present

## 2018-11-07 DIAGNOSIS — Z885 Allergy status to narcotic agent status: Secondary | ICD-10-CM | POA: Diagnosis not present

## 2018-11-07 DIAGNOSIS — R278 Other lack of coordination: Secondary | ICD-10-CM | POA: Diagnosis not present

## 2018-11-07 DIAGNOSIS — K449 Diaphragmatic hernia without obstruction or gangrene: Secondary | ICD-10-CM | POA: Diagnosis present

## 2018-11-07 DIAGNOSIS — Z89412 Acquired absence of left great toe: Secondary | ICD-10-CM | POA: Diagnosis not present

## 2018-11-07 DIAGNOSIS — R279 Unspecified lack of coordination: Secondary | ICD-10-CM | POA: Diagnosis not present

## 2018-11-07 DIAGNOSIS — E039 Hypothyroidism, unspecified: Secondary | ICD-10-CM | POA: Diagnosis not present

## 2018-11-07 DIAGNOSIS — Z8572 Personal history of non-Hodgkin lymphomas: Secondary | ICD-10-CM | POA: Diagnosis not present

## 2018-11-07 DIAGNOSIS — Z87891 Personal history of nicotine dependence: Secondary | ICD-10-CM | POA: Diagnosis not present

## 2018-11-07 DIAGNOSIS — G8929 Other chronic pain: Secondary | ICD-10-CM | POA: Diagnosis present

## 2018-11-07 DIAGNOSIS — Z66 Do not resuscitate: Secondary | ICD-10-CM | POA: Diagnosis not present

## 2018-11-07 DIAGNOSIS — K5900 Constipation, unspecified: Secondary | ICD-10-CM | POA: Diagnosis present

## 2018-11-07 DIAGNOSIS — Z8249 Family history of ischemic heart disease and other diseases of the circulatory system: Secondary | ICD-10-CM | POA: Diagnosis not present

## 2018-11-07 DIAGNOSIS — E860 Dehydration: Secondary | ICD-10-CM | POA: Diagnosis present

## 2018-11-07 DIAGNOSIS — I739 Peripheral vascular disease, unspecified: Secondary | ICD-10-CM | POA: Diagnosis not present

## 2018-11-07 DIAGNOSIS — Z743 Need for continuous supervision: Secondary | ICD-10-CM | POA: Diagnosis not present

## 2018-11-07 HISTORY — DX: Chronic kidney disease, stage 4 (severe): N18.4

## 2018-11-07 LAB — BASIC METABOLIC PANEL
Anion gap: 7 (ref 5–15)
BUN: 19 mg/dL (ref 8–23)
CO2: 25 mmol/L (ref 22–32)
Calcium: 9.1 mg/dL (ref 8.9–10.3)
Chloride: 111 mmol/L (ref 98–111)
Creatinine, Ser: 1.12 mg/dL (ref 0.61–1.24)
GFR calc Af Amer: >60 mL/min (ref >60)
GFR calc non Af Amer: 55 mL/min — ABNORMAL LOW (ref >60)
Glucose, Bld: 112 mg/dL — ABNORMAL HIGH (ref 70–99)
Potassium: 4.3 mmol/L (ref 3.5–5.1)
Sodium: 143 mmol/L (ref 135–145)

## 2018-11-07 LAB — CBC
HCT: 40.5 % (ref 39.0–52.0)
Hemoglobin: 13.1 g/dL (ref 13.0–17.0)
MCH: 31.5 pg (ref 26.0–34.0)
MCHC: 32.3 g/dL (ref 30.0–36.0)
MCV: 97.4 fL (ref 80.0–100.0)
Platelets: 135 10*3/uL — ABNORMAL LOW (ref 150–400)
RBC: 4.16 MIL/uL — ABNORMAL LOW (ref 4.22–5.81)
RDW: 13.4 % (ref 11.5–15.5)
WBC: 5.6 10*3/uL (ref 4.0–10.5)
nRBC: 0 % (ref 0.0–0.2)

## 2018-11-07 LAB — LIPASE, BLOOD: Lipase: 27 U/L (ref 11–51)

## 2018-11-07 LAB — HEPATIC FUNCTION PANEL
ALT: 420 U/L — ABNORMAL HIGH (ref 0–44)
AST: 538 U/L — ABNORMAL HIGH (ref 15–41)
Albumin: 3.7 g/dL (ref 3.5–5.0)
Alkaline Phosphatase: 86 U/L (ref 38–126)
Bilirubin, Direct: 2 mg/dL — ABNORMAL HIGH (ref 0.0–0.2)
Indirect Bilirubin: 1.4 mg/dL — ABNORMAL HIGH (ref 0.3–0.9)
Total Bilirubin: 3.4 mg/dL — ABNORMAL HIGH (ref 0.3–1.2)
Total Protein: 6.7 g/dL (ref 6.5–8.1)

## 2018-11-07 LAB — TYPE AND SCREEN
ABO/RH(D): O NEG
Antibody Screen: NEGATIVE

## 2018-11-07 LAB — APTT: aPTT: 31 seconds (ref 24–36)

## 2018-11-07 LAB — ABO/RH: ABO/RH(D): O NEG

## 2018-11-07 MED ORDER — ONDANSETRON HCL 4 MG/2ML IJ SOLN
4.0000 mg | Freq: Four times a day (QID) | INTRAMUSCULAR | Status: DC | PRN
  Administered 2018-11-08: 4 mg via INTRAVENOUS

## 2018-11-07 MED ORDER — SODIUM CHLORIDE 0.9 % IV BOLUS
1000.0000 mL | Freq: Once | INTRAVENOUS | Status: AC
  Administered 2018-11-07: 1000 mL via INTRAVENOUS

## 2018-11-07 MED ORDER — ZOLPIDEM TARTRATE 5 MG PO TABS: 5.0000 mg | ORAL_TABLET | Freq: Every evening | ORAL | Status: DC | PRN

## 2018-11-07 MED ORDER — TRAMADOL HCL 50 MG PO TABS
50.0000 mg | ORAL_TABLET | Freq: Three times a day (TID) | ORAL | Status: DC | PRN
  Administered 2018-11-07 – 2018-11-09 (×4): 50 mg via ORAL
  Filled 2018-11-07 (×4): qty 1

## 2018-11-07 MED ORDER — ONDANSETRON HCL 4 MG PO TABS: 4.0000 mg | ORAL_TABLET | Freq: Four times a day (QID) | ORAL | Status: DC | PRN

## 2018-11-07 MED ORDER — AMLODIPINE BESYLATE 5 MG PO TABS
5.0000 mg | ORAL_TABLET | Freq: Every day | ORAL | Status: DC
  Administered 2018-11-07 – 2018-11-13 (×7): 5 mg via ORAL
  Filled 2018-11-07 (×8): qty 1

## 2018-11-07 MED ORDER — GABAPENTIN 300 MG PO CAPS
300.0000 mg | ORAL_CAPSULE | Freq: Every day | ORAL | Status: DC
  Administered 2018-11-07 – 2018-11-12 (×6): 300 mg via ORAL
  Filled 2018-11-07 (×6): qty 1

## 2018-11-07 MED ORDER — ACETAMINOPHEN 325 MG PO TABS: 650.0000 mg | ORAL_TABLET | Freq: Four times a day (QID) | ORAL | Status: DC | PRN

## 2018-11-07 MED ORDER — IBUPROFEN 200 MG PO TABS
200.0000 mg | ORAL_TABLET | Freq: Four times a day (QID) | ORAL | Status: DC | PRN
  Administered 2018-11-08: 200 mg via ORAL
  Filled 2018-11-07: qty 1

## 2018-11-07 MED ORDER — LEVOTHYROXINE SODIUM 112 MCG PO TABS
112.0000 ug | ORAL_TABLET | Freq: Every day | ORAL | Status: DC
  Administered 2018-11-07 – 2018-11-13 (×7): 112 ug via ORAL
  Filled 2018-11-07 (×8): qty 1

## 2018-11-07 MED ORDER — TECHNETIUM TC 99M MEBROFENIN IV KIT
7.2000 | PACK | Freq: Once | INTRAVENOUS | Status: AC
  Administered 2018-11-07: 7.2 via INTRAVENOUS

## 2018-11-07 MED ORDER — HYDRALAZINE HCL 20 MG/ML IJ SOLN: 5.0000 mg | INTRAMUSCULAR | Status: DC | PRN

## 2018-11-07 MED ORDER — SODIUM CHLORIDE 0.9 % IV SOLN
INTRAVENOUS | Status: DC
  Administered 2018-11-07 – 2018-11-08 (×3): via INTRAVENOUS

## 2018-11-07 MED ORDER — ENALAPRIL MALEATE 10 MG PO TABS
20.0000 mg | ORAL_TABLET | Freq: Every day | ORAL | Status: DC
  Administered 2018-11-07: 20 mg via ORAL
  Filled 2018-11-07 (×2): qty 2

## 2018-11-07 MED ORDER — ACETAMINOPHEN 650 MG RE SUPP: 650.0000 mg | Freq: Four times a day (QID) | RECTAL | Status: DC | PRN

## 2018-11-07 MED ORDER — HEPARIN SODIUM (PORCINE) 5000 UNIT/ML IJ SOLN
5000.0000 [IU] | Freq: Three times a day (TID) | INTRAMUSCULAR | Status: AC
  Administered 2018-11-07: 5000 [IU] via SUBCUTANEOUS
  Filled 2018-11-07: qty 1

## 2018-11-07 MED ORDER — PIPERACILLIN-TAZOBACTAM 3.375 G IVPB
3.3750 g | Freq: Three times a day (TID) | INTRAVENOUS | Status: DC
  Administered 2018-11-07 – 2018-11-08 (×5): 3.375 g via INTRAVENOUS
  Filled 2018-11-07 (×6): qty 50

## 2018-11-07 MED ORDER — ASPIRIN EC 81 MG PO TBEC
81.0000 mg | DELAYED_RELEASE_TABLET | Freq: Every day | ORAL | Status: DC
  Administered 2018-11-07: 81 mg via ORAL
  Filled 2018-11-07 (×2): qty 1

## 2018-11-07 MED ORDER — PANTOPRAZOLE SODIUM 40 MG PO TBEC
40.0000 mg | DELAYED_RELEASE_TABLET | Freq: Every day | ORAL | Status: DC
  Administered 2018-11-07 – 2018-11-12 (×5): 40 mg via ORAL
  Filled 2018-11-07 (×5): qty 1

## 2018-11-07 MED ORDER — SENNOSIDES-DOCUSATE SODIUM 8.6-50 MG PO TABS: 1.0000 | ORAL_TABLET | Freq: Every evening | ORAL | Status: DC | PRN

## 2018-11-07 MED ORDER — GABAPENTIN 100 MG PO CAPS
200.0000 mg | ORAL_CAPSULE | Freq: Every morning | ORAL | Status: DC
  Administered 2018-11-07 – 2018-11-13 (×6): 200 mg via ORAL
  Filled 2018-11-07 (×8): qty 2

## 2018-11-07 MED ORDER — VITAMIN B-12 1000 MCG PO TABS
1000.0000 ug | ORAL_TABLET | Freq: Every day | ORAL | Status: DC
  Administered 2018-11-07 – 2018-11-13 (×6): 1000 ug via ORAL
  Filled 2018-11-07 (×7): qty 1

## 2018-11-07 NOTE — Consult Note (Addendum)
Reason for Consult: cholecystitis Referring Physician: Purhoit PCP:  Susy Frizzle, MD  CC: chest discomfort,abdominal discomfort, anxious, urinary frequency, disorientation, fever and chills   [name] is an 84 y.o. male.  HPI: Pt presented to the ED yesterday PM with some vague complaints of abdominal discomfort that started around noon. He complained of pain mid chest, similar to his GERD symptoms, but he was shaking and had chills.  He was a bit confused and his family knew something was wrong.  They called his PCP and were instructed to call EMS.  EMS found he had a temp of 102, shaking chills and he was transported to the ED, Code Sepsis was initiated.  He reports similar episode about 2-3 weeks ago got better with TUMS.  He has been on Prilosec for years. This all occurred rather acutely yesterday afternoon, family says he was fine in the AM.    Work up in the ED shows a fever of 101.3, no temp since that time.  CBC shows a WBC 4.3, H/H 14.4/45.4.  Platelets 134K.   Admission CMP shows T Bil 2.0, ALT 421, AST, 744, Alk phos is normal at 103.  Admit CXR:  Mild cardiomegaly with subsegmental atelectasis at the left base.  Moderate hiatal hernia. Abdominal CT:  Mild gallbladder wall edema. No calcified stone.  Extensive colonic diverticulosis without active inflammatorychanges. No bowel obstruction or active inflammation. Normal appendix.  Moderate size hiatal hernia. Bibasilar interstitial coarsening may represent chronic changes and possible superimposed edema.  Ultrasound shows: Cholelithiasis and gallbladder wall thickening may represent acute cholecystitis.  Additional issues include venous stasis dz/cellulitis, peripheral neuropathy treated with Unna boots last year  cellulitis. Nx of left neck non Hodgkins Lymphoma, Hypertension, hx of Malaria, and PVD.  In the ED he got blood cultures and started on abx.  Cefepime, flagyl, Vancomycin.  Zosyn started this AM 0500.  WE are ask to see.   AM labs so far show a normal WBC 5.6, H/H 13/40, platelets still low 135K BMP OK, we have ordered LFT's.  Past Medical History:  Diagnosis Date  . Acid reflux   . Cancer ([place]) 09/05/13   left neck-non-hodgkins lymphoma  . Hypertension    requires diuretic  . Hypothyroidism   . Malaria    while in the service  . Peripheral vascular disease ([place])    slow wound healing on legs    Past Surgical History:  Procedure Laterality Date  . AMPUTATION Left 06/25/2015   Procedure: LEFT  HALLUX AMPUTATION,;  Surgeon: Wylene Simmer, MD;  Location: [facility];  Service: Orthopedics;  Laterality: Left;  LOCAL/MAC  . MASS BIOPSY Left 09/05/2013   Procedure: EXCISIONAL BIOPSY OF LEFT NECK MASS;  Surgeon: Jerrell Belfast, MD;  Location: [facility];  Service: ENT;  Laterality: Left;  . TONSILLECTOMY    . YAG LASER APPLICATION Right 6/60/6301   Procedure: YAG LASER APPLICATION;  Surgeon: [name], MD;  Location: [facility];  Service: Ophthalmology;  Laterality: Right;    Family History  Problem Relation Age of Onset  . Lung cancer Brother   . Heart disease Brother   . Stroke Brother     Social History:  reports that he has quit smoking. His smoking use included cigars. He quit smokeless tobacco use about 39 years ago. He reports that he does not drink alcohol or use drugs. Patient lives at home with his wife who has dementia.  He helps her get dressed and does some stuff  around the house.  The children are there daily to assist.  Both use a walker around the house. 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 Allergies:  Allergies  Allergen Reactions  . Codeine Nausea And Vomiting  . Hydrocodone Nausea And Vomiting  . Percocet [Oxycodone-Acetaminophen] Nausea And Vomiting    Prior to Admission medications   Medication Sig Start Date End Date Taking? Authorizing  Provider  amLODipine (NORVASC) 5 MG tablet Take 1 tablet (5 mg total) by mouth daily. 05/29/18  Yes Susy Frizzle, MD  aspirin EC 81 MG tablet Take 1 tablet (81 mg total) by mouth daily. 09/05/13  Yes Jerrell Belfast, MD  enalapril (VASOTEC) 20 MG tablet TAKE 1 TABLET BY MOUTH  EVERY DAY 02/05/18  Yes Susy Frizzle, MD  furosemide (LASIX) 20 MG tablet TAKE 2 TABLETS BY MOUTH  EVERY DAY 09/24/18  Yes Susy Frizzle, MD  gabapentin (NEURONTIN) 100 MG capsule TAKE 2 CAPSULES EVERY  MORNING 11/10/17  Yes Hyatt, Max T, DPM  gabapentin (NEURONTIN) 300 MG capsule TAKE 1 CAPSULE BY MOUTH AT  BEDTIME 11/10/17  Yes Hyatt, Max T, DPM  levothyroxine (SYNTHROID, LEVOTHROID) 112 MCG tablet Take 1 tablet (112 mcg total) by mouth daily. 04/26/18  Yes Susy Frizzle, MD  traMADol (ULTRAM) 50 MG tablet TAKE 1 TABLET EVERY 8 HOURS AS NEEDED FOR PAIN 08/03/17  Yes Susy Frizzle, MD  vitamin B-12 (CYANOCOBALAMIN) 1000 MCG tablet Take 1 tablet (1,000 mcg total) by mouth daily. 05/01/15  Yes Susy Frizzle, MD  famotidine (PEPCID) 20 MG tablet Take 20 mg by mouth 2 (two) times daily.  02/13/12  [provider]   Anti-infectives (From admission, onward)   Start     Dose/Rate Route Frequency Ordered Stop   11/07/18 0500  piperacillin-tazobactam (ZOSYN) IVPB 3.375 g     3.375 g 12.5 mL/hr over 240 Minutes Intravenous Every 8 hours 11/07/18 0259     11/06/18 2000  vancomycin (VANCOCIN) 2,000 mg in sodium chloride 0.9 % 500 mL IVPB     2,000 mg 250 mL/hr over 120 Minutes Intravenous  Once 11/06/18 1942 11/07/18 0211   11/06/18 1945  ceFEPIme (MAXIPIME) 2 g in sodium chloride 0.9 % 100 mL IVPB     2 g 200 mL/hr over 30 Minutes Intravenous  Once 11/06/18 1939 11/06/18 2127   11/06/18 1945  metroNIDAZOLE (FLAGYL) IVPB 500 mg  Status:  Discontinued     500 mg 100 mL/hr over 60 Minutes Intravenous Every 8 hours 11/06/18 1939 11/07/18 0233   11/06/18 1945  vancomycin (VANCOCIN) IVPB 1000 mg/200 mL  premix  Status:  Discontinued     1,000 mg 200 mL/hr over 60 Minutes Intravenous  Once 11/06/18 1939 11/06/18 1942      Results for orders placed or performed during the hospital encounter of 11/06/18 (from the past 48 hour(s))  Comprehensive metabolic panel     Status: Abnormal   Collection Time: 11/06/18  6:55 PM  Result Value Ref Range   Sodium 142 135 - 145 mmol/L   Potassium 4.3 3.5 - 5.1 mmol/L   Chloride 109 98 - 111 mmol/L   CO2 21 (L) 22 - 32 mmol/L   Glucose, Bld 144 (H) 70 - 99 mg/dL   BUN 22 8 - 23 mg/dL   Creatinine, Ser 1.20 0.61 - 1.24 mg/dL  Calcium 9.0 8.9 - 10.3 mg/dL   Total Protein 7.4 6.5 - 8.1 g/dL   Albumin 4.3 3.5 - 5.0 g/dL   AST 744 (H) 15 - 41 U/L   ALT 421 (H) 0 - 44 U/L   Alkaline Phosphatase 103 38 - 126 U/L   Total Bilirubin 2.0 (H) 0.3 - 1.2 mg/dL   GFR calc non Af Amer 50 (L) >60 mL/min   GFR calc Af Amer 58 (L) >60 mL/min   Anion gap 12 5 - 15    Comment: Performed at [facility], [place] [address], [place], [place] [address]  CBC with Differential     Status: Abnormal   Collection Time: 11/06/18  6:55 PM  Result Value Ref Range   WBC 4.3 4.0 - 10.5 K/uL   RBC 4.70 4.22 - 5.81 MIL/uL   Hemoglobin 14.4 13.0 - 17.0 g/dL   HCT 45.4 39.0 - 52.0 %   MCV 96.6 80.0 - 100.0 fL   MCH 30.6 26.0 - 34.0 pg   MCHC 31.7 30.0 - 36.0 g/dL   RDW 13.4 11.5 - 15.5 %   Platelets 134 (L) 150 - 400 K/uL   nRBC 0.0 0.0 - 0.2 %   Neutrophils Relative % 79 %   Neutro Abs 3.4 1.7 - 7.7 K/uL   Lymphocytes Relative 11 %   Lymphs Abs 0.5 (L) 0.7 - 4.0 K/uL   Monocytes Relative 9 %   Monocytes Absolute 0.4 0.1 - 1.0 K/uL   Eosinophils Relative 0 %   Eosinophils Absolute 0.0 0.0 - 0.5 K/uL   Basophils Relative 0 %   Basophils Absolute 0.0 0.0 - 0.1 K/uL   Immature Granulocytes 1 %   Abs Immature Granulocytes 0.02 0.00 - 0.07 K/uL    Comment: Performed at [facility], [place] [address] [place], [place] [address]  Protime-INR      Status: None   Collection Time: 11/06/18  6:55 PM  Result Value Ref Range   Prothrombin Time 13.3 11.4 - 15.2 seconds   INR 1.02     Comment: Performed at [facility], [place] [address] [place], [place] [address]  Lipase, blood     Status: None   Collection Time: 11/06/18  6:55 PM  Result Value Ref Range   Lipase 30 11 - 51 U/L    Comment: Performed at [facility], [place] [address], [place], [place] [address]  I-Stat CG4 Lactic Acid, ED     Status: None   Collection Time: 11/06/18  6:56 PM  Result Value Ref Range   Lactic Acid, Venous 0.81 0.5 - 1.9 mmol/L  Basic metabolic panel     Status: Abnormal   Collection Time: 11/07/18  3:32 AM  Result Value Ref Range   Sodium 143 135 - 145 mmol/L   Potassium 4.3 3.5 - 5.1 mmol/L   Chloride 111 98 - 111 mmol/L   CO2 25 22 - 32 mmol/L   Glucose, Bld 112 (H) 70 - 99 mg/dL   BUN 19 8 - 23 mg/dL   Creatinine, Ser 1.12 0.61 - 1.24 mg/dL   Calcium 9.1 8.9 - 10.3 mg/dL   GFR calc non Af Amer 55 (L) >60 mL/min   GFR calc Af Amer >60 >60 mL/min   Anion gap 7 5 - 15    Comment: Performed at [place] [address], [place], [place] [address]  CBC     Status: Abnormal  Collection Time: 11/07/18  3:32 AM  Result Value Ref Range   WBC 5.6 4.0 - 10.5 K/uL   RBC 4.16 (L) 4.22 - 5.81 MIL/uL   Hemoglobin 13.1 13.0 - 17.0 g/dL   HCT 40.5 39.0 - 52.0 %   MCV 97.4 80.0 - 100.0 fL   MCH 31.5 26.0 - 34.0 pg   MCHC 32.3 30.0 - 36.0 g/dL   RDW 13.4 11.5 - 15.5 %   Platelets 135 (L) 150 - 400 K/uL    Comment: REPEATED TO VERIFY SPECIMEN CHECKED FOR CLOTS    nRBC 0.0 0.0 - 0.2 %    Comment: Performed at [facility], [place] [address] [place], [place] [address]  APTT     Status: None   Collection Time: 11/07/18  3:32 AM  Result Value Ref Range   aPTT 31 24 - 36 seconds    Comment: Performed at [facility], [place] [address] [place], [place] [address]   Type and screen [place]     Status: None   Collection Time: 11/07/18  3:32 AM  Result Value Ref Range   ABO/RH(D) O NEG    Antibody Screen NEG    Sample Expiration      11/10/2018 Performed at [facility], [place] [address] [place], [place] [address]   ABO/Rh     Status: None (Preliminary result)   Collection Time: 11/07/18  3:32 AM  Result Value Ref Range   ABO/RH(D)      Jenetta Downer NEG Performed at [facility], [place] [address], [place], [place] [address]     Dg Chest 2 View  Result Date: 11/06/2018 CLINICAL DATA:  Cough EXAM: CHEST - 2 VIEW COMPARISON:  09/02/2013, CT chest 04/08/2014 FINDINGS: Low lung volumes. Subsegmental atelectasis at the left base. No pleural effusion. Mild cardiomegaly. Moderate hiatal hernia. IMPRESSION: 1. Mild cardiomegaly with subsegmental atelectasis at the left base. 2. Moderate hiatal hernia Electronically Signed   By: Donavan Foil M.D.   On: 11/06/2018 19:55   Ct Abdomen Pelvis W Contrast  Result Date: 11/06/2018 CLINICAL DATA:  84 year old male with abdominal distention. EXAM: CT ABDOMEN AND PELVIS WITH CONTRAST TECHNIQUE: Multidetector CT imaging of the abdomen and pelvis was performed using the standard protocol following bolus administration of intravenous contrast. CONTRAST:  147m ISOVUE-300 IOPAMIDOL (ISOVUE-300) INJECTION 61% COMPARISON:  CT dated 08/30/2013 FINDINGS: Lower chest: There are bibasilar interstitial coarsening may represent chronic changes and possible superimposed edema. Clinical correlation is recommended. Trace bilateral pleural effusions noted. There is multi vessel coronary vascular calcification. No intra-abdominal free air or free fluid. Hepatobiliary: The liver is unremarkable. No intrahepatic biliary ductal dilatation. There is mild gallbladder wall edema. No calcified stone. No pericholecystic fluid. Ultrasound may provide better evaluation of the gallbladder. Pancreas:  Unremarkable. No pancreatic ductal dilatation or surrounding inflammatory changes. Spleen: Normal in size without focal abnormality. Adrenals/Urinary Tract: The adrenal glands are unremarkable. There is no hydronephrosis on either side. There is symmetric enhancement and excretion of contrast by both kidneys. The visualized ureters and urinary bladder are unremarkable. Stomach/Bowel: There is extensive sigmoid and diffuse colonic diverticulosis without active inflammatory changes. There is a moderate size hiatal hernia. There is no bowel obstruction or active inflammation. Normal appendix. Vascular/Lymphatic: Moderate aortoiliac atherosclerotic disease. There is a 2.7 cm infrarenal aortic ectasia. The IVC is grossly unremarkable. No portal venous gas. Multiple small retroperitoneal lymph nodes. No adenopathy. Reproductive: Enlarged prostate gland measuring 5.3 cm in transverse  axial diameter. The seminal vesicles are symmetric. Other: None Musculoskeletal: Osteopenia with multilevel degenerative changes of the spine and lower thoracic kyphosis. T12 lucent lesion appears similar to prior CT of 2014, likely benign. No acute osseous pathology. IMPRESSION: 1. Mild gallbladder wall edema. No calcified stone. Ultrasound may provide better evaluation of the gallbladder. 2. Extensive colonic diverticulosis without active inflammatory changes. No bowel obstruction or active inflammation. Normal appendix. 3. Moderate size hiatal hernia. 4. Bibasilar interstitial coarsening may represent chronic changes and possible superimposed edema. Clinical correlation is recommended. 5. Multi vessel coronary vascular calcification. Electronically Signed   By: Anner Crete M.D.   On: 11/06/2018 22:36   US Abdomen Limited Ruq  Result Date: 11/07/2018 CLINICAL DATA:  84 y/o  M; nausea and vomiting. EXAM: ULTRASOUND ABDOMEN LIMITED RIGHT UPPER QUADRANT COMPARISON:  11/06/2018 CT abdomen and pelvis. FINDINGS: Gallbladder: Multiple  echogenic gallstones with distal acoustic shadowing. Gallbladder wall thickening to 4 mm. Negative sonographic Murphy's sign. No pericholecystic fluid. Common bile duct: Diameter: 3 mm Liver: No focal lesion identified. Within normal limits in parenchymal echogenicity. Portal vein is patent on color Doppler imaging with normal direction of blood flow towards the liver. IMPRESSION: Cholelithiasis and gallbladder wall thickening may represent acute cholecystitis in the appropriate clinical setting. Electronically Signed   By: Kristine Garbe M.D.   On: 11/07/2018 00:06    Review of Systems  Constitutional: Positive for chills and fever. Negative for diaphoresis, malaise/fatigue and weight loss.  HENT: Positive for hearing loss (He is a bit hard of hearing).   Eyes: Negative.   Respiratory: Positive for cough (Sounds like it is mostly clear sputum.  And intermittent.). Negative for hemoptysis, sputum production, shortness of breath (Mobility limited to his walker.) and wheezing.   Cardiovascular: Negative.   Gastrointestinal: Positive for abdominal pain (Mid epigastric, substernal discomfort.) and heartburn. Negative for blood in stool, constipation, diarrhea, melena, nausea and vomiting.  Genitourinary: Negative.   Musculoskeletal: Negative.   Skin: Negative for itching and rash.       He has a history of bilateral venous stasis disease/cellulitis which is quite stable no lesions at this time.  Neurological:       He was having some confusion yesterday which is what alarmed the family most.  Endo/Heme/Allergies: Negative for environmental allergies and polydipsia. Bruises/bleeds easily.  Psychiatric/Behavioral: Negative for depression, hallucinations, memory loss, substance abuse and suicidal ideas. The patient is not nervous/anxious and does not have insomnia.        He lives with his wife who has dementia and still helps care for her.  He apparently has a lot of support with his children  nearby.  1 of his sons is with him right now.   Blood pressure (!) 155/81, pulse 66, temperature (!) 101.3 F (38.5 C), temperature source Rectal, resp. rate 18, height _0  (1.88 m), weight 97.9 kg, SpO2 97 %. Physical Exam  Constitutional: He is oriented to person, place, and time. He appears well-developed and well-nourished. No distress.  This is an elderly somewhat frail-appearing 84 year old but in no distress.  Denies any pain or discomfort currently.  He is on oxygen.  HENT:  Head: Normocephalic and atraumatic.  Mouth/Throat: Oropharynx is clear and moist. No oropharyngeal exudate.  He is slightly hard of hearing.  Eyes: Right eye exhibits no discharge. Left eye exhibits no discharge. No scleral icterus.  Pupils are equal  Neck: Normal range of motion. Neck supple. No JVD present. No tracheal deviation present. No thyromegaly present.  Cardiovascular: Normal rate, regular rhythm and normal heart sounds.  No murmur heard. Respiratory: Effort normal and breath sounds normal. No stridor. No respiratory distress. He has no wheezes. He has no rales. He exhibits no tenderness.  GI: Soft. Bowel sounds are normal. He exhibits no distension and no mass. There is no tenderness. There is no rebound and no guarding.  Musculoskeletal: He exhibits edema (Trace edema but he also has significant venous stasis disease which is well controlled currently.). He exhibits no tenderness.  Lymphadenopathy:    He has no cervical adenopathy.  Neurological: He is alert and oriented to person, place, and time. No cranial nerve deficit.  Skin: Skin is warm and dry. No rash noted. He is not diaphoretic. No erythema. No pallor.  Bilateral venous stasis disease with no cellulitis and no current skin breakdown.  Psychiatric: He has a normal mood and affect. His behavior is normal. Thought content normal.    Assessment/Plan: Abdominal pain with fever/sepsis Cholelithiasis with gallbladder wall thickening CBD 3  mm, gallbladder wall edema on CT, elevated LFTs. Hx GERD Hx non-Hodgkin's lymphoma left neck with prior excisional left neck biopsy Hx malaria -[place] World War [employer] Hypertension Venous stasis disease -bilateral Hx peripheral neuropathy Hypothyroid on supplement Thrombocytopenia DNR  Plan: I saw the patient along with Dr. Herbert Moors.  We agree that he is currently asymptomatic.  LFT's are pending and we agree on HIDA scan.  1 of his sons is with him.  We discussed the possibility of cholecystectomy.  We also discussed possibility of a IR drain placement if the family wanted to avoid surgery.  Repeat LFTs show the AST down to 538, ALT 420, direct bilirubin is up to 3.4.  That ups my concern he is passing stones thru CBD.  Will review with Dr. [name], we may need a Gi consult and MRI.      [name] 11/07/2018, 7:38 AM

## 2018-11-07 NOTE — ED Provider Notes (Signed)
[facility] DEPT Provider Note   CSN: [id number] Arrival date & time: 11/06/18  1814     History   Chief Complaint Chief Complaint  Patient presents with  . Fever    HPI [name] is a 84 y.o. male.  HPI Patient presents to the emergency department with onset of fever and chills earlier today along with abdominal discomfort.  Patient is very vague about the onset of the abdominal pain.  Denies any cough or urinary symptoms.  Patient is had no upper respiratory symptoms as well.  Patient states that nothing seems to make the condition better but palpation makes the pain worse.  The patient states that it mostly started around noon today.  The patient denies chest pain, shortness of breath, headache,blurred vision, neck pain, , cough, weakness, numbness, dizziness, anorexia, edema,nausea, vomiting, diarrhea, rash, back pain, dysuria, hematemesis, bloody stool, near syncope, or syncope. Past Medical History:  Diagnosis Date  . Acid reflux   . Cancer ([place]) 09/05/13   left neck-non-hodgkins lymphoma  . Hypertension    requires diuretic  . Hypothyroidism   . Malaria    while in the service  . Peripheral vascular disease ([place])    slow wound healing on legs    Patient Active Problem List   Diagnosis Date Noted  . Cholecystitis, acute 11/07/2018  . Gallstone 11/07/2018  . Hypothyroidism 11/07/2018  . CKD (chronic kidney disease) stage 3, GFR 30-59 ml/min (HCC) 11/07/2018  . Cellulitis 01/18/2017  . Venous stasis dermatitis 05/28/2015  . Wound, open, toe 05/28/2015  . Insomnia 05/28/2015  . Lymphoma ([place]) 09/05/2013  . Cancer ([place]) 09/05/2013  . Lymphadenopathy, cervical 08/29/2013    Class: Chronic  . Acid reflux   . Hypertension   . Peripheral vascular disease [facility])     Past Surgical History:  Procedure Laterality Date  . AMPUTATION Left 06/25/2015   Procedure: LEFT  HALLUX AMPUTATION,;  Surgeon: Wylene Simmer, MD;  Location: [facility];  Service: Orthopedics;  Laterality: Left;  LOCAL/MAC  . MASS BIOPSY Left 09/05/2013   Procedure: EXCISIONAL BIOPSY OF LEFT NECK MASS;  Surgeon: Jerrell Belfast, MD;  Location: [facility];  Service: ENT;  Laterality: Left;  . TONSILLECTOMY    . YAG LASER APPLICATION Right 0/17/7939   Procedure: YAG LASER APPLICATION;  Surgeon: [name], MD;  Location: [facility];  Service: Ophthalmology;  Laterality: Right;        Home Medications    Prior to Admission medications   Medication Sig Start Date End Date Taking? Authorizing Provider  amLODipine (NORVASC) 5 MG tablet Take 1 tablet (5 mg total) by mouth daily. 05/29/18  Yes Susy Frizzle, MD  aspirin EC 81 MG tablet Take 1 tablet (81 mg total) by mouth daily. 09/05/13  Yes Jerrell Belfast, MD  enalapril (VASOTEC) 20 MG tablet TAKE 1 TABLET BY MOUTH  EVERY DAY 02/05/18  Yes Susy Frizzle, MD  furosemide (LASIX) 20 MG tablet TAKE 2 TABLETS BY MOUTH  EVERY DAY 09/24/18  Yes Susy Frizzle, MD  gabapentin (NEURONTIN) 100 MG capsule TAKE 2 CAPSULES EVERY  MORNING 11/10/17  Yes Hyatt, Max T, DPM  gabapentin (NEURONTIN) 300 MG capsule TAKE 1 CAPSULE BY MOUTH AT  BEDTIME 11/10/17  Yes Hyatt, Max T, DPM  levothyroxine (SYNTHROID, LEVOTHROID) 112 MCG tablet Take 1 tablet (112 mcg total) by mouth daily. 04/26/18  Yes Susy Frizzle, MD  traMADol (ULTRAM) 50 MG tablet TAKE 1 TABLET EVERY 8  HOURS AS NEEDED FOR PAIN 08/03/17  Yes Susy Frizzle, MD  vitamin B-12 (CYANOCOBALAMIN) 1000 MCG tablet Take 1 tablet (1,000 mcg total) by mouth daily. 05/01/15  Yes Susy Frizzle, MD  famotidine (PEPCID) 20 MG tablet Take 20 mg by mouth 2 (two) times daily.  02/13/12  [provider]    Family History No family history on file.  Social History Social History   Tobacco Use  . Smoking status: Former Smoker    Types: Cigars  . Smokeless tobacco: Former Systems developer    Quit date: 10/05/1979  Substance Use Topics  . Alcohol use: No  . Drug  use: No     Allergies   Codeine; Hydrocodone; and Percocet [oxycodone-acetaminophen]   Review of Systems Review of Systems All other systems negative except as documented in the HPI. All pertinent positives and negatives as reviewed in the HPI.  Physical Exam Updated Vital Signs BP (!) 155/82 (BP Location: Right Arm) Comment: Simultaneous filing. User may not have seen previous data.  Pulse 83 Comment: Simultaneous filing. User may not have seen previous data.  Temp (!) 101.3 F (38.5 C) (Rectal)   Resp 18 Comment: Simultaneous filing. User may not have seen previous data.  Wt 90.7 kg   SpO2 95% Comment: Simultaneous filing. User may not have seen previous data.  BMI 22.53 kg/m   Physical Exam  Constitutional: He is oriented to person, place, and time. He appears well-developed and well-nourished. No distress.  HENT:  Head: Normocephalic and atraumatic.  Mouth/Throat: Oropharynx is clear and moist.  Eyes: Pupils are equal, round, and reactive to light.  Neck: Normal range of motion. Neck supple.  Cardiovascular: Normal rate, regular rhythm and normal heart sounds. Exam reveals no gallop and no friction rub.  No murmur heard. Pulmonary/Chest: Effort normal and breath sounds normal. No respiratory distress. He has no wheezes.  Abdominal: Soft. Bowel sounds are normal. He exhibits no distension and no mass. There is tenderness in the right upper quadrant, epigastric area and periumbilical area. There is no rebound and no guarding.  Neurological: He is alert and oriented to person, place, and time. He exhibits normal muscle tone. Coordination normal.  Skin: Skin is warm and dry. Capillary refill takes less than 2 seconds. No rash noted. No erythema.  Psychiatric: He has a normal mood and affect. His behavior is normal.  Nursing note and vitals reviewed.    ED Treatments / Results  Labs (all labs ordered are listed, but only abnormal results are displayed) Labs Reviewed    COMPREHENSIVE METABOLIC PANEL - Abnormal; Notable for the following components:      Result Value   CO2 21 (*)    Glucose, Bld 144 (*)    AST 744 (*)    ALT 421 (*)    Total Bilirubin 2.0 (*)    GFR calc non Af Amer 50 (*)    GFR calc Af Amer 58 (*)    All other components within normal limits  CBC WITH DIFFERENTIAL/PLATELET - Abnormal; Notable for the following components:   Platelets 134 (*)    Lymphs Abs 0.5 (*)    All other components within normal limits  CULTURE, BLOOD (ROUTINE X 2)  CULTURE, BLOOD (ROUTINE X 2)  PROTIME-INR  LIPASE, BLOOD  I-STAT CG4 LACTIC ACID, ED    EKG EKG Interpretation  Date/Time:  Tuesday November 06 2018 18:47:18 EST Ventricular Rate:  91 PR Interval:    QRS Duration: 97 QT Interval:  359  QTC Calculation: 442 R Axis:   71 Text Interpretation:  Sinus rhythm Prolonged PR interval Borderline low voltage, extremity leads Borderline ST depression, anterolateral leads Confirmed by Merrily Pew [phone]) on 11/06/2018 7:39:06 PM   Radiology Dg Chest 2 View  Result Date: 11/06/2018 CLINICAL DATA:  Cough EXAM: CHEST - 2 VIEW COMPARISON:  09/02/2013, CT chest 04/08/2014 FINDINGS: Low lung volumes. Subsegmental atelectasis at the left base. No pleural effusion. Mild cardiomegaly. Moderate hiatal hernia. IMPRESSION: 1. Mild cardiomegaly with subsegmental atelectasis at the left base. 2. Moderate hiatal hernia Electronically Signed   By: Donavan Foil M.D.   On: 11/06/2018 19:55   Ct Abdomen Pelvis W Contrast  Result Date: 11/06/2018 CLINICAL DATA:  84 year old male with abdominal distention. EXAM: CT ABDOMEN AND PELVIS WITH CONTRAST TECHNIQUE: Multidetector CT imaging of the abdomen and pelvis was performed using the standard protocol following bolus administration of intravenous contrast. CONTRAST:  117m ISOVUE-300 IOPAMIDOL (ISOVUE-300) INJECTION 61% COMPARISON:  CT dated 08/30/2013 FINDINGS: Lower chest: There are bibasilar interstitial coarsening  may represent chronic changes and possible superimposed edema. Clinical correlation is recommended. Trace bilateral pleural effusions noted. There is multi vessel coronary vascular calcification. No intra-abdominal free air or free fluid. Hepatobiliary: The liver is unremarkable. No intrahepatic biliary ductal dilatation. There is mild gallbladder wall edema. No calcified stone. No pericholecystic fluid. Ultrasound may provide better evaluation of the gallbladder. Pancreas: Unremarkable. No pancreatic ductal dilatation or surrounding inflammatory changes. Spleen: Normal in size without focal abnormality. Adrenals/Urinary Tract: The adrenal glands are unremarkable. There is no hydronephrosis on either side. There is symmetric enhancement and excretion of contrast by both kidneys. The visualized ureters and urinary bladder are unremarkable. Stomach/Bowel: There is extensive sigmoid and diffuse colonic diverticulosis without active inflammatory changes. There is a moderate size hiatal hernia. There is no bowel obstruction or active inflammation. Normal appendix. Vascular/Lymphatic: Moderate aortoiliac atherosclerotic disease. There is a 2.7 cm infrarenal aortic ectasia. The IVC is grossly unremarkable. No portal venous gas. Multiple small retroperitoneal lymph nodes. No adenopathy. Reproductive: Enlarged prostate gland measuring 5.3 cm in transverse axial diameter. The seminal vesicles are symmetric. Other: None Musculoskeletal: Osteopenia with multilevel degenerative changes of the spine and lower thoracic kyphosis. T12 lucent lesion appears similar to prior CT of 2014, likely benign. No acute osseous pathology. IMPRESSION: 1. Mild gallbladder wall edema. No calcified stone. Ultrasound may provide better evaluation of the gallbladder. 2. Extensive colonic diverticulosis without active inflammatory changes. No bowel obstruction or active inflammation. Normal appendix. 3. Moderate size hiatal hernia. 4. Bibasilar  interstitial coarsening may represent chronic changes and possible superimposed edema. Clinical correlation is recommended. 5. Multi vessel coronary vascular calcification. Electronically Signed   By: AAnner CreteM.D.   On: 11/06/2018 22:36   UKoreaAbdomen Limited Ruq  Result Date: 11/07/2018 CLINICAL DATA:  84y/o  M; nausea and vomiting. EXAM: ULTRASOUND ABDOMEN LIMITED RIGHT UPPER QUADRANT COMPARISON:  11/06/2018 CT abdomen and pelvis. FINDINGS: Gallbladder: Multiple echogenic gallstones with distal acoustic shadowing. Gallbladder wall thickening to 4 mm. Negative sonographic Murphy's sign. No pericholecystic fluid. Common bile duct: Diameter: 3 mm Liver: No focal lesion identified. Within normal limits in parenchymal echogenicity. Portal vein is patent on color Doppler imaging with normal direction of blood flow towards the liver. IMPRESSION: Cholelithiasis and gallbladder wall thickening may represent acute cholecystitis in the appropriate clinical setting. Electronically Signed   By: LKristine GarbeM.D.   On: 11/07/2018 00:06    Procedures Procedures (including critical care time)  Medications Ordered  in ED Medications  metroNIDAZOLE (FLAGYL) IVPB 500 mg (0 mg Intravenous Stopped 11/06/18 2353)  sodium chloride (PF) 0.9 % injection (has no administration in time range)  ceFEPIme (MAXIPIME) 2 g in sodium chloride 0.9 % 100 mL IVPB (0 g Intravenous Stopped 11/06/18 2127)  vancomycin (VANCOCIN) 2,000 mg in sodium chloride 0.9 % 500 mL IVPB (2,000 mg Intravenous New Bag/Given 11/06/18 2127)  iopamidol (ISOVUE-300) 61 % injection 100 mL (100 mLs Intravenous Contrast Given 11/06/18 2158)     Initial Impression / Assessment and Plan / ED Course  I have reviewed the triage vital signs and the nursing notes.  Pertinent labs & imaging results that were available during my care of the patient were reviewed by me and considered in my medical decision making (see chart for details).       Patient has most likely cholecystitis based on his findings on the ultrasound CT and his laboratory testing.  I spoke with the general surgeon Dr. Excell Seltzer and the [facility] will admit the patient.  Patient was started on antibiotics.  Patient and son advised the plan and all questions were answered.  Final Clinical Impressions(s) / ED Diagnoses   Final diagnoses:  RUQ pain    ED Discharge Orders    None       Dalia Heading, PA-C 11/07/18 0040    Mesner, Corene Cornea, MD 11/07/18 [phone]

## 2018-11-07 NOTE — ED Notes (Signed)
ED TO INPATIENT HANDOFF REPORT  Name/Age/Gender [name] 84 y.o. male  Code Status    Code Status Orders  (From admission, onward)         Start     Ordered   11/07/18 0237  Do not attempt resuscitation (DNR)  Continuous    Question Answer Comment  In the event of cardiac or respiratory ARREST Do not call a "code blue"   In the event of cardiac or respiratory ARREST Do not perform Intubation, CPR, defibrillation or ACLS   In the event of cardiac or respiratory ARREST Use medication by any route, position, wound care, and other measures to relive pain and suffering. May use oxygen, suction and manual treatment of airway obstruction as needed for comfort.      11/07/18 0237        Code Status History    Date Active Date Inactive Code Status Order ID Comments User Context   01/18/2017 0248 01/20/2017 1745 DNR [id number]  Karmen Bongo, MD Inpatient      Home/SNF/Other Home  Chief Complaint Sepsis  Level of Care/Admitting Diagnosis ED Disposition    ED Disposition Condition Comment   Admit  [facility]ospital [facility] [[id number]]  Level of Care: Med-Surg [16]  Diagnosis: Acute cholecystitis [575.0.ICD-9-CM]  Admitting Physician: Ivor Costa [[id number]]  Attending Physician: Ivor Costa [phone]  Estimated length of stay: past midnight tomorrow  Certification:: I certify this patient will need inpatient services for at least 2 midnights  PT Class (Do Not Modify): Inpatient [101]  PT Acc Code (Do Not Modify): Private [1]       Medical History Past Medical History:  Diagnosis Date  . Acid reflux   . Cancer ([place]) 09/05/13   left neck-non-hodgkins lymphoma  . Hypertension    requires diuretic  . Hypothyroidism   . Malaria    while in the service  . Peripheral vascular disease ([facility])    slow wound healing on legs    Allergies Allergies  Allergen Reactions  . Codeine Nausea And Vomiting  . Hydrocodone Nausea And Vomiting  . Percocet  [Oxycodone-Acetaminophen] Nausea And Vomiting    IV Location/Drains/Wounds Patient Lines/Drains/Airways Status   Active Line/Drains/Airways    Name:   Placement date:   Placement time:   Site:   Days:   Peripheral IV 11/06/18 Left Forearm   11/06/18    1825    Forearm   1   Peripheral IV 11/06/18 Left;Anterior Arm   11/06/18    1850    Arm   1   Incision 08/19/13 Neck Left   08/19/13    1155     1906   Incision 09/05/13 Neck Left   09/05/13    1015     1889   Incision (Closed) 06/25/15 Foot Left   06/25/15    1547     1231   Wound / Incision (Open or Dehisced) 01/18/17 Venous stasis ulcer Foot Left Between toes/Top of foot   01/18/17    0255    Foot   658   Wound / Incision (Open or Dehisced) 01/18/17 Venous stasis ulcer Foot Right Between toes/Top of foot   01/18/17    0255    Foot   658          Labs/Imaging Results for orders placed or performed during the hospital encounter of 11/06/18 (from the past 48 hour(s))  Comprehensive metabolic panel     Status: Abnormal   Collection Time: 11/06/18  6:55 PM  Result Value Ref Range   Sodium 142 135 - 145 mmol/L   Potassium 4.3 3.5 - 5.1 mmol/L   Chloride 109 98 - 111 mmol/L   CO2 21 (L) 22 - 32 mmol/L   Glucose, Bld 144 (H) 70 - 99 mg/dL   BUN 22 8 - 23 mg/dL   Creatinine, Ser 1.20 0.61 - 1.24 mg/dL   Calcium 9.0 8.9 - 10.3 mg/dL   Total Protein 7.4 6.5 - 8.1 g/dL   Albumin 4.3 3.5 - 5.0 g/dL   AST 744 (H) 15 - 41 U/L   ALT 421 (H) 0 - 44 U/L   Alkaline Phosphatase 103 38 - 126 U/L   Total Bilirubin 2.0 (H) 0.3 - 1.2 mg/dL   GFR calc non Af Amer 50 (L) >60 mL/min   GFR calc Af Amer 58 (L) >60 mL/min   Anion gap 12 5 - 15    Comment: Performed at [facility], [place] [address] [place], [place] [address]  CBC with Differential     Status: Abnormal   Collection Time: 11/06/18  6:55 PM  Result Value Ref Range   WBC 4.3 4.0 - 10.5 K/uL   RBC 4.70 4.22 - 5.81 MIL/uL   Hemoglobin 14.4 13.0 - 17.0 g/dL   HCT 45.4  39.0 - 52.0 %   MCV 96.6 80.0 - 100.0 fL   MCH 30.6 26.0 - 34.0 pg   MCHC 31.7 30.0 - 36.0 g/dL   RDW 13.4 11.5 - 15.5 %   Platelets 134 (L) 150 - 400 K/uL   nRBC 0.0 0.0 - 0.2 %   Neutrophils Relative % 79 %   Neutro Abs 3.4 1.7 - 7.7 K/uL   Lymphocytes Relative 11 %   Lymphs Abs 0.5 (L) 0.7 - 4.0 K/uL   Monocytes Relative 9 %   Monocytes Absolute 0.4 0.1 - 1.0 K/uL   Eosinophils Relative 0 %   Eosinophils Absolute 0.0 0.0 - 0.5 K/uL   Basophils Relative 0 %   Basophils Absolute 0.0 0.0 - 0.1 K/uL   Immature Granulocytes 1 %   Abs Immature Granulocytes 0.02 0.00 - 0.07 K/uL    Comment: Performed at [facility], [place] [address] [place], [place] [address]  Protime-INR     Status: None   Collection Time: 11/06/18  6:55 PM  Result Value Ref Range   Prothrombin Time 13.3 11.4 - 15.2 seconds   INR 1.02     Comment: Performed at [facility], [place] [address], [place], [place] [address]  Lipase, blood     Status: None   Collection Time: 11/06/18  6:55 PM  Result Value Ref Range   Lipase 30 11 - 51 U/L    Comment: Performed at [facility], [place] [address] [place], [place] [address]  I-Stat CG4 Lactic Acid, ED     Status: None   Collection Time: 11/06/18  6:56 PM  Result Value Ref Range   Lactic Acid, Venous 0.81 0.5 - 1.9 mmol/L   Dg Chest 2 View  Result Date: 11/06/2018 CLINICAL DATA:  Cough EXAM: CHEST - 2 VIEW COMPARISON:  09/02/2013, CT chest 04/08/2014 FINDINGS: Low lung volumes. Subsegmental atelectasis at the left base. No pleural effusion. Mild cardiomegaly. Moderate hiatal hernia. IMPRESSION: 1. Mild cardiomegaly with subsegmental atelectasis at the left base. 2. Moderate hiatal hernia Electronically Signed   By: Donavan Foil M.D.   On: 11/06/2018 19:55   Ct Abdomen  Pelvis W Contrast  Result Date: 11/06/2018 CLINICAL DATA:  84 year old male with abdominal distention. EXAM: CT ABDOMEN AND PELVIS WITH CONTRAST  TECHNIQUE: Multidetector CT imaging of the abdomen and pelvis was performed using the standard protocol following bolus administration of intravenous contrast. CONTRAST:  176mL ISOVUE-300 IOPAMIDOL (ISOVUE-300) INJECTION 61% COMPARISON:  CT dated 08/30/2013 FINDINGS: Lower chest: There are bibasilar interstitial coarsening may represent chronic changes and possible superimposed edema. Clinical correlation is recommended. Trace bilateral pleural effusions noted. There is multi vessel coronary vascular calcification. No intra-abdominal free air or free fluid. Hepatobiliary: The liver is unremarkable. No intrahepatic biliary ductal dilatation. There is mild gallbladder wall edema. No calcified stone. No pericholecystic fluid. Ultrasound may provide better evaluation of the gallbladder. Pancreas: Unremarkable. No pancreatic ductal dilatation or surrounding inflammatory changes. Spleen: Normal in size without focal abnormality. Adrenals/Urinary Tract: The adrenal glands are unremarkable. There is no hydronephrosis on either side. There is symmetric enhancement and excretion of contrast by both kidneys. The visualized ureters and urinary bladder are unremarkable. Stomach/Bowel: There is extensive sigmoid and diffuse colonic diverticulosis without active inflammatory changes. There is a moderate size hiatal hernia. There is no bowel obstruction or active inflammation. Normal appendix. Vascular/Lymphatic: Moderate aortoiliac atherosclerotic disease. There is a 2.7 cm infrarenal aortic ectasia. The IVC is grossly unremarkable. No portal venous gas. Multiple small retroperitoneal lymph nodes. No adenopathy. Reproductive: Enlarged prostate gland measuring 5.3 cm in transverse axial diameter. The seminal vesicles are symmetric. Other: None Musculoskeletal: Osteopenia with multilevel degenerative changes of the spine and lower thoracic kyphosis. T12 lucent lesion appears similar to prior CT of 2014, likely benign. No acute  osseous pathology. IMPRESSION: 1. Mild gallbladder wall edema. No calcified stone. Ultrasound may provide better evaluation of the gallbladder. 2. Extensive colonic diverticulosis without active inflammatory changes. No bowel obstruction or active inflammation. Normal appendix. 3. Moderate size hiatal hernia. 4. Bibasilar interstitial coarsening may represent chronic changes and possible superimposed edema. Clinical correlation is recommended. 5. Multi vessel coronary vascular calcification. Electronically Signed   By: Anner Crete M.D.   On: 11/06/2018 22:36   US Abdomen Limited Ruq  Result Date: 11/07/2018 CLINICAL DATA:  84 y/o  M; nausea and vomiting. EXAM: ULTRASOUND ABDOMEN LIMITED RIGHT UPPER QUADRANT COMPARISON:  11/06/2018 CT abdomen and pelvis. FINDINGS: Gallbladder: Multiple echogenic gallstones with distal acoustic shadowing. Gallbladder wall thickening to 4 mm. Negative sonographic Murphy's sign. No pericholecystic fluid. Common bile duct: Diameter: 3 mm Liver: No focal lesion identified. Within normal limits in parenchymal echogenicity. Portal vein is patent on color Doppler imaging with normal direction of blood flow towards the liver. IMPRESSION: Cholelithiasis and gallbladder wall thickening may represent acute cholecystitis in the appropriate clinical setting. Electronically Signed   By: Kristine Garbe M.D.   On: 11/07/2018 00:06   EKG Interpretation  Date/Time:  Tuesday November 06 2018 18:47:18 EST Ventricular Rate:  91 PR Interval:    QRS Duration: 97 QT Interval:  359 QTC Calculation: 442 R Axis:   71 Text Interpretation:  Sinus rhythm Prolonged PR interval Borderline low voltage, extremity leads Borderline ST depression, anterolateral leads Confirmed by Merrily Pew [phone]) on 11/06/2018 7:39:06 PM   Pending Labs Unresulted Labs (From admission, onward)    Start     Ordered   11/07/18 0932  Basic metabolic panel  Tomorrow morning,   R     11/07/18 0237    11/07/18 0500  CBC  Tomorrow morning,   R     11/07/18 0237   11/07/18  0500  APTT  Tomorrow morning,   R     11/07/18 0237   11/07/18 0239  Type and screen [facility]  Once,   R    Comments:  [place]    11/07/18 0238   11/06/18 1850  Culture, blood (Routine x 2)  BLOOD CULTURE X 2,   STAT     11/06/18 1849          Vitals/Pain Today's Vitals   11/07/18 0215 11/07/18 0230 11/07/18 0330 11/07/18 0342  BP: (!) 117/105 136/85 (!) 141/82   Pulse: 65 60 61   Resp: 20 14 17    Temp:      TempSrc:      SpO2: 97% 96% 98%   Weight:      PainSc:    0-No pain    Isolation Precautions No active isolations  Medications Medications  sodium chloride (PF) 0.9 % injection (has no administration in time range)  aspirin EC tablet 81 mg (has no administration in time range)  traMADol (ULTRAM) tablet 50 mg (has no administration in time range)  amLODipine (NORVASC) tablet 5 mg (has no administration in time range)  enalapril (VASOTEC) tablet 20 mg (has no administration in time range)  levothyroxine (SYNTHROID, LEVOTHROID) tablet 112 mcg (has no administration in time range)  vitamin B-12 (CYANOCOBALAMIN) tablet 1,000 mcg (has no administration in time range)  gabapentin (NEURONTIN) capsule 200 mg (has no administration in time range)  gabapentin (NEURONTIN) capsule 300 mg (has no administration in time range)  acetaminophen (TYLENOL) tablet 650 mg (has no administration in time range)    Or  acetaminophen (TYLENOL) suppository 650 mg (has no administration in time range)  senna-docusate (Senokot-S) tablet 1 tablet (has no administration in time range)  ondansetron (ZOFRAN) tablet 4 mg (has no administration in time range)    Or  ondansetron (ZOFRAN) injection 4 mg (has no administration in time range)  zolpidem (AMBIEN) tablet 5 mg (has no administration in time range)  piperacillin-tazobactam (ZOSYN) IVPB 3.375 g (has no administration in time  range)  ceFEPIme (MAXIPIME) 2 g in sodium chloride 0.9 % 100 mL IVPB (0 g Intravenous Stopped 11/06/18 2127)  vancomycin (VANCOCIN) 2,000 mg in sodium chloride 0.9 % 500 mL IVPB (0 mg Intravenous Stopped 11/07/18 0211)  iopamidol (ISOVUE-300) 61 % injection 100 mL (100 mLs Intravenous Contrast Given 11/06/18 2158)    Mobility walks with person assist

## 2018-11-07 NOTE — Plan of Care (Signed)
Pt is stable at this time. No c/o pain. Son at bedside.

## 2018-11-07 NOTE — Progress Notes (Signed)
Pt back on unit from HIDA scan.

## 2018-11-07 NOTE — Progress Notes (Signed)
PROGRESS NOTE    [name]  XFG:[id number] DOB: 04-03-1919 DOA: 11/06/2018 PCP: Susy Frizzle, MD   Brief Narrative:  84 year old with past medical history relevant for non-Hodgkin's lymphoma in remission, hypertension, hypothyroidism, chronic pain, reflux, stage II CKD admitted with fever and vague reports of abdominal pain and found to have possible acute cholecystitis on CT and right upper quadrant ultrasound.   Assessment & Plan:   Principal Problem:   Cholecystitis, acute Active Problems:   Acid reflux   Hypertension   Gallstone   Hypothyroidism   CKD (chronic kidney disease) stage 3, GFR 30-59 ml/min (HCC)   Acute cholecystitis   Abnormal liver function   NHL (non-Hodgkin's lymphoma) ([place])   #) Fever/possible acute cholecystitis: Currently the patient has no right upper quadrant tenderness.  He does not classically describe symptoms of biliary colic though he is not an excellent historian though surprisingly clear considering his advanced age.  His primary symptoms he reports sound a great deal like reflux and relieved with taking calcium carbonate tablets.  His recall is fairly excellent as he does recall fighting in the [place] to take it back from the [place]. -N.p.o. -Continue IV Zosyn -General surgery consult -We will order HIDA scan  #) Hypertension: -Continue midodrine 5 mg daily -Continue aspirin 81 mg -Continue enalapril 20 mg daily  #) Chronic venous stasis dermatitis: -Hold furosemide 40 mg daily  #) Hypothyroidism: -Continue levothyroxine 112 mcg daily  #) Pain/psych: -Continue gabapentin 200 mg every morning and 300 mg nightly  #) Stage II CKD: -Stable  Fluids: Gentle IV fluids Electrolytes: Monitor and supplement Nutrition: N.p.o.  Prophylaxis: SCDs  Disposition: Pending HIDA scan  DNR   Consultants:   General surgery  Procedures:   11/07/2018 HIDA scan: Pending  Antimicrobials:   IV Zosyn started  11/07/2018   Subjective: This morning the patient reports he is feeling well.  He denies any abdominal pain, nausea, vomiting, diarrhea, cough, congestion, rhinorrhea.  Objective: Vitals:   11/07/18 0330 11/07/18 0410 11/07/18 0431 11/07/18 1008  BP: (!) 141/82 (!) 155/81  (!) 142/76  Pulse: 61 66  63  Resp: 17 18  16   Temp:    98.2 F (36.8 C)  TempSrc:    Oral  SpO2: 98% 97%  99%  Weight:   97.9 kg   Height:   6\' 2"  (1.88 m)     Intake/Output Summary (Last 24 hours) at 11/07/2018 1151 Last data filed at 11/07/2018 1120 Gross per 24 hour  Intake 2100 ml  Output 546 ml  Net 1554 ml   Filed Weights   11/06/18 1856 11/07/18 0431  Weight: 90.7 kg 97.9 kg    Examination:  General exam: Appears calm and comfortable  Respiratory system: Clear to auscultation. Respiratory effort normal. Cardiovascular system: Regular rate and rhythm, no murmurs Gastrointestinal system: Soft, nondistended, no right upper quadrant tenderness, no rebound or guarding. Central nervous system: Alert and oriented.  Grossly intact, moving all extremities Extremities: Trace lower extremity edema. Skin: Chronic venous stasis dermatitis Psychiatry: Judgement and insight appear normal. Mood & affect appropriate.     Data Reviewed: I have personally reviewed following labs and imaging studies  CBC: Recent Labs  Lab 11/06/18 1855 11/07/18 0332  WBC 4.3 5.6  NEUTROABS 3.4  --   HGB 14.4 13.1  HCT 45.4 40.5  MCV 96.6 97.4  PLT 134* 967*   Basic Metabolic Panel: Recent Labs  Lab 11/06/18 1855 11/07/18 0332  NA 142 143  K 4.3  4.3  CL 109 111  CO2 21* 25  GLUCOSE 144* 112*  BUN 22 19  CREATININE 1.20 1.12  CALCIUM 9.0 9.1   GFR: Estimated Creatinine Clearance: 43.8 mL/min (by C-G formula based on SCr of 1.12 mg/dL). Liver Function Tests: Recent Labs  Lab 11/06/18 1855 11/07/18 0332  AST 744* 538*  ALT 421* 420*  ALKPHOS 103 86  BILITOT 2.0* 3.4*  PROT 7.4 6.7  ALBUMIN 4.3  3.7   Recent Labs  Lab 11/06/18 1855 11/07/18 0332  LIPASE 30 27   No results for input(s): AMMONIA in the last 168 hours. Coagulation Profile: Recent Labs  Lab 11/06/18 1855  INR 1.02   Cardiac Enzymes: No results for input(s): CKTOTAL, CKMB, CKMBINDEX, TROPONINI in the last 168 hours. BNP (last 3 results) No results for input(s): PROBNP in the last 8760 hours. HbA1C: No results for input(s): HGBA1C in the last 72 hours. CBG: No results for input(s): GLUCAP in the last 168 hours. Lipid Profile: No results for input(s): CHOL, HDL, LDLCALC, TRIG, CHOLHDL, LDLDIRECT in the last 72 hours. Thyroid Function Tests: No results for input(s): TSH, T4TOTAL, FREET4, T3FREE, THYROIDAB in the last 72 hours. Anemia Panel: No results for input(s): VITAMINB12, FOLATE, FERRITIN, TIBC, IRON, RETICCTPCT in the last 72 hours. Sepsis Labs: Recent Labs  Lab 11/06/18 1856  LATICACIDVEN 0.81    No results found for this or any previous visit (from the past 240 hour(s)).       Radiology Studies: Dg Chest 2 View  Result Date: 11/06/2018 CLINICAL DATA:  Cough EXAM: CHEST - 2 VIEW COMPARISON:  09/02/2013, CT chest 04/08/2014 FINDINGS: Low lung volumes. Subsegmental atelectasis at the left base. No pleural effusion. Mild cardiomegaly. Moderate hiatal hernia. IMPRESSION: 1. Mild cardiomegaly with subsegmental atelectasis at the left base. 2. Moderate hiatal hernia Electronically Signed   By: Donavan Foil M.D.   On: 11/06/2018 19:55   Ct Abdomen Pelvis W Contrast  Result Date: 11/06/2018 CLINICAL DATA:  85 year old male with abdominal distention. EXAM: CT ABDOMEN AND PELVIS WITH CONTRAST TECHNIQUE: Multidetector CT imaging of the abdomen and pelvis was performed using the standard protocol following bolus administration of intravenous contrast. CONTRAST:  185mL ISOVUE-300 IOPAMIDOL (ISOVUE-300) INJECTION 61% COMPARISON:  CT dated 08/30/2013 FINDINGS: Lower chest: There are bibasilar interstitial  coarsening may represent chronic changes and possible superimposed edema. Clinical correlation is recommended. Trace bilateral pleural effusions noted. There is multi vessel coronary vascular calcification. No intra-abdominal free air or free fluid. Hepatobiliary: The liver is unremarkable. No intrahepatic biliary ductal dilatation. There is mild gallbladder wall edema. No calcified stone. No pericholecystic fluid. Ultrasound may provide better evaluation of the gallbladder. Pancreas: Unremarkable. No pancreatic ductal dilatation or surrounding inflammatory changes. Spleen: Normal in size without focal abnormality. Adrenals/Urinary Tract: The adrenal glands are unremarkable. There is no hydronephrosis on either side. There is symmetric enhancement and excretion of contrast by both kidneys. The visualized ureters and urinary bladder are unremarkable. Stomach/Bowel: There is extensive sigmoid and diffuse colonic diverticulosis without active inflammatory changes. There is a moderate size hiatal hernia. There is no bowel obstruction or active inflammation. Normal appendix. Vascular/Lymphatic: Moderate aortoiliac atherosclerotic disease. There is a 2.7 cm infrarenal aortic ectasia. The IVC is grossly unremarkable. No portal venous gas. Multiple small retroperitoneal lymph nodes. No adenopathy. Reproductive: Enlarged prostate gland measuring 5.3 cm in transverse axial diameter. The seminal vesicles are symmetric. Other: None Musculoskeletal: Osteopenia with multilevel degenerative changes of the spine and lower thoracic kyphosis. T12 lucent lesion appears similar  to prior CT of 2014, likely benign. No acute osseous pathology. IMPRESSION: 1. Mild gallbladder wall edema. No calcified stone. Ultrasound may provide better evaluation of the gallbladder. 2. Extensive colonic diverticulosis without active inflammatory changes. No bowel obstruction or active inflammation. Normal appendix. 3. Moderate size hiatal hernia. 4.  Bibasilar interstitial coarsening may represent chronic changes and possible superimposed edema. Clinical correlation is recommended. 5. Multi vessel coronary vascular calcification. Electronically Signed   By: Anner Crete M.D.   On: 11/06/2018 22:36   US Abdomen Limited Ruq  Result Date: 11/07/2018 CLINICAL DATA:  84 y/o  M; nausea and vomiting. EXAM: ULTRASOUND ABDOMEN LIMITED RIGHT UPPER QUADRANT COMPARISON:  11/06/2018 CT abdomen and pelvis. FINDINGS: Gallbladder: Multiple echogenic gallstones with distal acoustic shadowing. Gallbladder wall thickening to 4 mm. Negative sonographic Murphy's sign. No pericholecystic fluid. Common bile duct: Diameter: 3 mm Liver: No focal lesion identified. Within normal limits in parenchymal echogenicity. Portal vein is patent on color Doppler imaging with normal direction of blood flow towards the liver. IMPRESSION: Cholelithiasis and gallbladder wall thickening may represent acute cholecystitis in the appropriate clinical setting. Electronically Signed   By: Kristine Garbe M.D.   On: 11/07/2018 00:06        Scheduled Meds: . amLODipine  5 mg Oral Daily  . aspirin EC  81 mg Oral Daily  . enalapril  20 mg Oral Daily  . gabapentin  200 mg Oral q morning - 10a  . gabapentin  300 mg Oral QHS  . levothyroxine  112 mcg Oral QAC breakfast  . pantoprazole  40 mg Oral Q1200  . vitamin B-12  1,000 mcg Oral Daily   Continuous Infusions: . sodium chloride 50 mL/hr at 11/07/18 0806  . piperacillin-tazobactam (ZOSYN)  IV 3.375 g (11/07/18 0501)     LOS: 0 days    Time spent: 28    Cristy Folks, MD [facility]  If 7PM-7AM, please contact night-coverage [email or web address] Password [email or web address] 11/07/2018, 11:51 AM

## 2018-11-07 NOTE — Progress Notes (Signed)
Pt taken for NM scan. Pt stable at this time.

## 2018-11-07 NOTE — Progress Notes (Signed)
Pharmacy Antibiotic Note  [name] is a 84 y.o. male admitted on 11/06/2018 with cholecystitis.  Presented to the ED with fever, chills and abdominal pain.  In the ED, patient received Metronidazole, Cefepime and Vancomycin IV x 1 dose each.  Upon admission, Pharmacy has been consulted for Zosyn dosing.  Plan: Zosyn 3.375g IV q8h (4 hour infusion).  Need for further dosage adjustment appears unlikely at present.    Will sign off at this time.  Please reconsult if a change in clinical status warrants re-evaluation of dosage.    Weight: 200 lb (90.7 kg)  Temp (24hrs), Avg:101.3 F (38.5 C), Min:101.3 F (38.5 C), Max:101.3 F (38.5 C)  Recent Labs  Lab 11/06/18 1855 11/06/18 1856  WBC 4.3  --   CREATININE 1.20  --   LATICACIDVEN  --  0.81    Estimated Creatinine Clearance: 45.1 mL/min (by C-G formula based on SCr of 1.2 mg/dL).    Allergies  Allergen Reactions  . Codeine Nausea And Vomiting  . Hydrocodone Nausea And Vomiting  . Percocet [Oxycodone-Acetaminophen] Nausea And Vomiting    Thank you for allowing pharmacy to be a part of this patient's care.  Everette Rank, PharmD 11/07/2018 3:00 AM

## 2018-11-07 NOTE — H&P (Addendum)
History and Physical    [name]:[id number] DOB: [birth year] DOA: 11/06/2018  Referring MD/NP/PA:   PCP: Jimmy Frizzle, MD   Patient coming from:  The patient is coming from home.  At baseline, pt is partially dependent for most of ADL.        Chief Complaint: Fever, chills, abdominal pain  HPI: [name] is a 84 y.o. male with medical history significant of hypertension, GERD, hypothyroidism, PVD, CKD 3, NHL in neck in remission( s/p of radiation therapy), who presents with fever, chills, abdominal pain.  Per his son, patient started having fever, chills in this afternoon.  He also has mild abdominal pain and indigestion.  Associated with nausea, but no vomiting or diarrhea.  Abdominal pain seems to be located in the right upper quadrant and epigastric area, constant, mild, nonradiating.  Patient does not have chest pain, shortness of breath, cough.  No symptoms of UTI or unilateral weakness.  ED Course: pt was found to have WBC 4.3, lactic acid 0.81, INR 1.02, stable renal function, lipase at 30, abnormal liver function (ALP 103, AST 744, ALT 421, total bilirubin 2.0), temperature 101.3, no tachycardia, oxygen saturation 91 to 95% on room air.  Chest x-ray is negative for infiltration.  CT abdomen/pelvis showed gallbladder wall edema.  Ultrasound of RUQ showed gallstone and cholecystitis.  Review of Systems:   General: has fevers, chills, no body weight gain, has poor appetite, has fatigue HEENT: no blurry vision, hearing changes or sore throat Respiratory: no dyspnea, coughing, wheezing CV: no chest pain, no palpitations GI: has nausea, abdominal pain, no diarrhea, constipation, vomiting GU: no dysuria, burning on urination, increased urinary frequency, hematuria  Ext: has trace leg edema Neuro: no unilateral weakness, numbness, or tingling, no vision change or hearing loss Skin: no rash, no skin tear. MSK: No muscle spasm, no deformity, no limitation of range of  movement in spin Heme: No easy bruising.  Travel history: No recent long distant travel.  Allergy:  Allergies  Allergen Reactions  . Codeine Nausea And Vomiting  . Hydrocodone Nausea And Vomiting  . Percocet [Oxycodone-Acetaminophen] Nausea And Vomiting    Past Medical History:  Diagnosis Date  . Acid reflux   . Cancer ([place]) 09/05/13   left neck-non-hodgkins lymphoma  . Hypertension    requires diuretic  . Hypothyroidism   . Malaria    while in the service  . Peripheral vascular disease ([place])    slow wound healing on legs    Past Surgical History:  Procedure Laterality Date  . AMPUTATION Left 06/25/2015   Procedure: LEFT  HALLUX AMPUTATION,;  Surgeon: Wylene Simmer, MD;  Location: [facility];  Service: Orthopedics;  Laterality: Left;  LOCAL/MAC  . MASS BIOPSY Left 09/05/2013   Procedure: EXCISIONAL BIOPSY OF LEFT NECK MASS;  Surgeon: Jerrell Belfast, MD;  Location: [facility];  Service: ENT;  Laterality: Left;  . TONSILLECTOMY    . YAG LASER APPLICATION Right 2/72/5366   Procedure: YAG LASER APPLICATION;  Surgeon: [name], MD;  Location: [facility];  Service: Ophthalmology;  Laterality: Right;    Social History:  reports that he has quit smoking. His smoking use included cigars. He quit smokeless tobacco use about 39 years ago. He reports that he does not drink alcohol or use drugs.  Family History:  Family History  Problem Relation Age of Onset  . Lung cancer Brother   . Heart disease Brother   . Stroke Brother  Prior to Admission medications   Medication Sig Start Date End Date Taking? Authorizing Provider  amLODipine (NORVASC) 5 MG tablet Take 1 tablet (5 mg total) by mouth daily. 05/29/18  Yes Jimmy Frizzle, MD  aspirin EC 81 MG tablet Take 1 tablet (81 mg total) by mouth daily. 09/05/13  Yes Jerrell Belfast, MD  enalapril (VASOTEC) 20 MG tablet TAKE 1 TABLET BY MOUTH  EVERY DAY 02/05/18  Yes Jimmy Frizzle, MD  furosemide (LASIX) 20 MG tablet  TAKE 2 TABLETS BY MOUTH  EVERY DAY 09/24/18  Yes Jimmy Frizzle, MD  gabapentin (NEURONTIN) 100 MG capsule TAKE 2 CAPSULES EVERY  MORNING 11/10/17  Yes Hyatt, Max T, DPM  gabapentin (NEURONTIN) 300 MG capsule TAKE 1 CAPSULE BY MOUTH AT  BEDTIME 11/10/17  Yes Hyatt, Max T, DPM  levothyroxine (SYNTHROID, LEVOTHROID) 112 MCG tablet Take 1 tablet (112 mcg total) by mouth daily. 04/26/18  Yes Jimmy Frizzle, MD  traMADol (ULTRAM) 50 MG tablet TAKE 1 TABLET EVERY 8 HOURS AS NEEDED FOR PAIN 08/03/17  Yes Jimmy Frizzle, MD  vitamin B-12 (CYANOCOBALAMIN) 1000 MCG tablet Take 1 tablet (1,000 mcg total) by mouth daily. 05/01/15  Yes Jimmy Frizzle, MD  famotidine (PEPCID) 20 MG tablet Take 20 mg by mouth 2 (two) times daily.  02/13/12  [provider]    Physical Exam: Vitals:   11/07/18 0230 11/07/18 0330 11/07/18 0410 11/07/18 0431  BP: 136/85 (!) 141/82 (!) 155/81   Pulse: 60 61 66   Resp: _0 Temp:      TempSrc:      SpO2: 96% 98% 97%   Weight:    97.9 kg  Height:    _1  (1.88 m)   General: Not in acute distress HEENT:       Eyes: PERRL, EOMI, no scleral icterus.       ENT: No discharge from the ears and nose, no pharynx injection, no tonsillar enlargement.        Neck: No JVD, no bruit, no mass felt. Heme: No neck lymph node enlargement. Cardiac: S1/S2, RRR, No murmurs, No gallops or rubs. Respiratory:  No rales, wheezing, rhonchi or rubs. GI: Soft, nondistended, has mild tenderness in RUQ, no rebound pain, no organomegaly, BS present. GU: No hematuria Ext: Has trace leg edema bilaterally. 2+DP/PT pulse bilaterally. Musculoskeletal: No joint deformities, No joint redness or warmth, no limitation of ROM in spin. Skin: No rashes.  Has venous insufficiency change in both legs. Neuro: Alert, oriented X3, cranial nerves II-XII grossly intact, moves all extremities normally.  Psych: Patient is not psychotic, no suicidal or hemocidal ideation.  Labs on Admission: I  have personally reviewed following labs and imaging studies  CBC: Recent Labs  Lab 11/06/18 1855 11/07/18 0332  WBC 4.3 5.6  NEUTROABS 3.4  --   HGB 14.4 13.1  HCT 45.4 40.5  MCV 96.6 97.4  PLT 134* 169*   Basic Metabolic Panel: Recent Labs  Lab 11/06/18 1855 11/07/18 0332  NA 142 143  K 4.3 4.3  CL 109 111  CO2 21* 25  GLUCOSE 144* 112*  BUN 22 19  CREATININE 1.20 1.12  CALCIUM 9.0 9.1   GFR: Estimated Creatinine Clearance: 43.8 mL/min (by C-G formula based on SCr of 1.12 mg/dL). Liver Function Tests: Recent Labs  Lab 11/06/18 1855  AST 744*  ALT 421*  ALKPHOS 103  BILITOT 2.0*  PROT 7.4  ALBUMIN 4.3   Recent Labs  Lab 11/06/18 1855  LIPASE 30   No results for input(s): AMMONIA in the last 168 hours. Coagulation Profile: Recent Labs  Lab 11/06/18 1855  INR 1.02   Cardiac Enzymes: No results for input(s): CKTOTAL, CKMB, CKMBINDEX, TROPONINI in the last 168 hours. BNP (last 3 results) No results for input(s): PROBNP in the last 8760 hours. HbA1C: No results for input(s): HGBA1C in the last 72 hours. CBG: No results for input(s): GLUCAP in the last 168 hours. Lipid Profile: No results for input(s): CHOL, HDL, LDLCALC, TRIG, CHOLHDL, LDLDIRECT in the last 72 hours. Thyroid Function Tests: No results for input(s): TSH, T4TOTAL, FREET4, T3FREE, THYROIDAB in the last 72 hours. Anemia Panel: No results for input(s): VITAMINB12, FOLATE, FERRITIN, TIBC, IRON, RETICCTPCT in the last 72 hours. Urine analysis:    Component Value Date/Time   COLORURINE YELLOW 03/03/2017 1029   APPEARANCEUR CLEAR 03/03/2017 1029   LABSPEC 1.020 03/03/2017 1029   PHURINE 5.5 03/03/2017 1029   GLUCOSEU NEGATIVE 03/03/2017 1029   HGBUR NEGATIVE 03/03/2017 1029   BILIRUBINUR NEGATIVE 03/03/2017 1029   [place] 03/03/2017 1029   PROTEINUR NEGATIVE 03/03/2017 1029   UROBILINOGEN 1.0 02/13/2012 1401   NITRITE NEGATIVE 03/03/2017 1029   LEUKOCYTESUR 1+ (A)  03/03/2017 1029   Sepsis Labs: _0 (procalcitonin:4,lacticidven:4) )No results found for this or any previous visit (from the past 240 hour(s)).   Radiological Exams on Admission: Dg Chest 2 View  Result Date: 11/06/2018 CLINICAL DATA:  Cough EXAM: CHEST - 2 VIEW COMPARISON:  09/02/2013, CT chest 04/08/2014 FINDINGS: Low lung volumes. Subsegmental atelectasis at the left base. No pleural effusion. Mild cardiomegaly. Moderate hiatal hernia. IMPRESSION: 1. Mild cardiomegaly with subsegmental atelectasis at the left base. 2. Moderate hiatal hernia Electronically Signed   By: Donavan Foil M.D.   On: 11/06/2018 19:55   Ct Abdomen Pelvis W Contrast  Result Date: 11/06/2018 CLINICAL DATA:  84 year old male with abdominal distention. EXAM: CT ABDOMEN AND PELVIS WITH CONTRAST TECHNIQUE: Multidetector CT imaging of the abdomen and pelvis was performed using the standard protocol following bolus administration of intravenous contrast. CONTRAST:  131m ISOVUE-300 IOPAMIDOL (ISOVUE-300) INJECTION 61% COMPARISON:  CT dated 08/30/2013 FINDINGS: Lower chest: There are bibasilar interstitial coarsening may represent chronic changes and possible superimposed edema. Clinical correlation is recommended. Trace bilateral pleural effusions noted. There is multi vessel coronary vascular calcification. No intra-abdominal free air or free fluid. Hepatobiliary: The liver is unremarkable. No intrahepatic biliary ductal dilatation. There is mild gallbladder wall edema. No calcified stone. No pericholecystic fluid. Ultrasound may provide better evaluation of the gallbladder. Pancreas: Unremarkable. No pancreatic ductal dilatation or surrounding inflammatory changes. Spleen: Normal in size without focal abnormality. Adrenals/Urinary Tract: The adrenal glands are unremarkable. There is no hydronephrosis on either side. There is symmetric enhancement and excretion of contrast by both kidneys. The visualized ureters and urinary  bladder are unremarkable. Stomach/Bowel: There is extensive sigmoid and diffuse colonic diverticulosis without active inflammatory changes. There is a moderate size hiatal hernia. There is no bowel obstruction or active inflammation. Normal appendix. Vascular/Lymphatic: Moderate aortoiliac atherosclerotic disease. There is a 2.7 cm infrarenal aortic ectasia. The IVC is grossly unremarkable. No portal venous gas. Multiple small retroperitoneal lymph nodes. No adenopathy. Reproductive: Enlarged prostate gland measuring 5.3 cm in transverse axial diameter. The seminal vesicles are symmetric. Other: None Musculoskeletal: Osteopenia with multilevel degenerative changes of the spine and lower thoracic kyphosis. T12 lucent lesion appears similar to prior CT of 2014, likely benign. No acute osseous pathology. IMPRESSION: 1. Mild gallbladder  wall edema. No calcified stone. Ultrasound may provide better evaluation of the gallbladder. 2. Extensive colonic diverticulosis without active inflammatory changes. No bowel obstruction or active inflammation. Normal appendix. 3. Moderate size hiatal hernia. 4. Bibasilar interstitial coarsening may represent chronic changes and possible superimposed edema. Clinical correlation is recommended. 5. Multi vessel coronary vascular calcification. Electronically Signed   By: Anner Crete M.D.   On: 11/06/2018 22:36   US Abdomen Limited Ruq  Result Date: 11/07/2018 CLINICAL DATA:  84 y/o  M; nausea and vomiting. EXAM: ULTRASOUND ABDOMEN LIMITED RIGHT UPPER QUADRANT COMPARISON:  11/06/2018 CT abdomen and pelvis. FINDINGS: Gallbladder: Multiple echogenic gallstones with distal acoustic shadowing. Gallbladder wall thickening to 4 mm. Negative sonographic Murphy's sign. No pericholecystic fluid. Common bile duct: Diameter: 3 mm Liver: No focal lesion identified. Within normal limits in parenchymal echogenicity. Portal vein is patent on color Doppler imaging with normal direction of blood  flow towards the liver. IMPRESSION: Cholelithiasis and gallbladder wall thickening may represent acute cholecystitis in the appropriate clinical setting. Electronically Signed   By: Kristine Garbe M.D.   On: 11/07/2018 00:06     EKG: Independently reviewed.  Sinus rhythm, QTC 442, low voltage, early R wave progression, nonspecific T wave change.  Assessment/Plan Principal Problem:   Cholecystitis, acute Active Problems:   Acid reflux   Hypertension   Gallstone   Hypothyroidism   CKD (chronic kidney disease) stage 3, GFR 30-59 ml/min (HCC)   Acute cholecystitis   Abnormal liver function   NHL (non-Hodgkin's lymphoma) (HCC)   Cholecystitis, acute and gall stone: General surgeon, Dr. Excell Seltzer was consulted.  Will see patient in morning.  -will admit to med-surg bed as inpt -Abx: IV Zosyn (patient received 1 dose of vancomycin, cefepime and Flagyl in ED). -Follow-up of blood culture. -PRN tramadol for pain (patient cannot tolerate morphine) - IV fluid: 1 L normal saline, then 125 cc/h -INR/PTT/type & screen  Acid reflux: -protonix  NHL: s/p of radiation therapy.  In remission. -Followed up with Dr. Benay Spice.  HTN:  -Continue home medications: Amlodipine, enalapril -Hold Lasix -IV hydralazine prn  Hypothyroidism: Last TSH was 0.75 on 02/20/18 -Continue home Synthroid  CKD (chronic kidney disease) stage 3, GFR 30-59 ml/min ([place]): Recent baseline creatinine 1.49 on 02/20/2018.  His creatinine is 1.26, BUN 22.  Stable. -Follow-up renal function by BMP  Abnormal liver function: Due to gallstone. -Avoid using liver toxic medications, such as Tylenol. - use Ibuprofen low dose prn for fever  Perioperative Cardiac Risk: Patient has multiple comorbidities, but does not have significant cardiac or pulmonary history. Currently patient is partially dependent of his ADLs, IADLs. Patient does not have chest pain, shortness of breath, palpitation.  EKG has no acute change. At  this time point, no further work up is needed. His GUPTA score perioperative myocardial infarction or cardaic arrest is at 0.84%. Low risk.   Inpatient status:  # Patient requires inpatient status due to high intensity of service, high risk for further deterioration and high frequency of surveillance required.  I certify that at the point of admission it is my clinical judgment that the patient will require inpatient hospital care spanning beyond 2 midnights from the point of admission.  . This patient has multiple chronic comorbidities including hypertension, GERD, hypothyroidism, PVD, CKD 3, NHL in neck in remission( s/p of radiation therapy). . Now patient has presenting symptoms include fever, chills, abdominal pain. . The worrisome physical exam findings include abdominal tenderness, and fever. . The initial radiographic and  laboratory data are worrisome because of abnormal liver function, gallstone, cholecystitis by abdominal ultrasound and CT scan. . Current medical needs: please see my assessment and plan . Predictability of an adverse outcome (risk): Patient has multiple comorbidities, now presents with acute cholecystitis and gallstone, will likely need surgery.  Given his old age and multiple comorbidities, patient is at high risk of deteriorating. Patient will need to stay in hospital for at least 2 days.     DVT ppx: SCD Code Status: DNR (I discussed with patient in the presence of his son, and explained the meaning of CODE STATUS. Patient wants to be DNR) Family Communication:    Yes, patient's son  at bed side Disposition Plan:  Anticipate discharge back to previous home environment Consults called: Education officer, environmental, Dr. Excell Seltzer Admission status:  medical floor/inpt    Date of Service 11/07/2018    Ivor Costa [facility] Pager [phone]  If 7PM-7AM, please contact night-coverage [email or web address] Password [facility] 11/07/2018, 6:37 AM

## 2018-11-08 ENCOUNTER — Encounter (HOSPITAL_COMMUNITY)
Admission: EM | Disposition: A | Discharge status: Skilled Nursing Facility | Payer: Self-pay | Source: Home / Self Care | Attending: Internal Medicine

## 2018-11-08 ENCOUNTER — Inpatient Hospital Stay (HOSPITAL_COMMUNITY): Payer: Medicare Other | Admitting: Anesthesiology

## 2018-11-08 ENCOUNTER — Encounter (HOSPITAL_COMMUNITY): Payer: Self-pay | Admitting: Anesthesiology

## 2018-11-08 ENCOUNTER — Inpatient Hospital Stay (HOSPITAL_COMMUNITY): Payer: Medicare Other

## 2018-11-08 HISTORY — PX: CHOLECYSTECTOMY: SHX55

## 2018-11-08 LAB — CBC
HCT: 36.8 % — ABNORMAL LOW (ref 39.0–52.0)
Hemoglobin: 11.8 g/dL — ABNORMAL LOW (ref 13.0–17.0)
MCH: 31.5 pg (ref 26.0–34.0)
MCHC: 32.1 g/dL (ref 30.0–36.0)
MCV: 98.1 fL (ref 80.0–100.0)
Platelets: 120 10*3/uL — ABNORMAL LOW (ref 150–400)
RBC: 3.75 MIL/uL — ABNORMAL LOW (ref 4.22–5.81)
RDW: 13.8 % (ref 11.5–15.5)
WBC: 4.4 10*3/uL (ref 4.0–10.5)
nRBC: 0 % (ref 0.0–0.2)

## 2018-11-08 LAB — COMPREHENSIVE METABOLIC PANEL
ALT: 247 U/L — ABNORMAL HIGH (ref 0–44)
AST: 218 U/L — ABNORMAL HIGH (ref 15–41)
Albumin: 3.3 g/dL — ABNORMAL LOW (ref 3.5–5.0)
Alkaline Phosphatase: 71 U/L (ref 38–126)
Anion gap: 8 (ref 5–15)
BUN: 16 mg/dL (ref 8–23)
CO2: 23 mmol/L (ref 22–32)
Calcium: 8.7 mg/dL — ABNORMAL LOW (ref 8.9–10.3)
Chloride: 110 mmol/L (ref 98–111)
Creatinine, Ser: 1.3 mg/dL — ABNORMAL HIGH (ref 0.61–1.24)
GFR calc Af Amer: 53 mL/min — ABNORMAL LOW (ref >60)
GFR calc non Af Amer: 46 mL/min — ABNORMAL LOW (ref >60)
Glucose, Bld: 86 mg/dL (ref 70–99)
Potassium: 4.1 mmol/L (ref 3.5–5.1)
Sodium: 141 mmol/L (ref 135–145)
Total Bilirubin: 2.8 mg/dL — ABNORMAL HIGH (ref 0.3–1.2)
Total Protein: 6 g/dL — ABNORMAL LOW (ref 6.5–8.1)

## 2018-11-08 LAB — MAGNESIUM: Magnesium: 1.8 mg/dL (ref 1.7–2.4)

## 2018-11-08 SURGERY — LAPAROSCOPIC CHOLECYSTECTOMY WITH INTRAOPERATIVE CHOLANGIOGRAM
Anesthesia: General | Site: Abdomen

## 2018-11-08 MED ORDER — LACTATED RINGERS IR SOLN
Status: DC | PRN
  Administered 2018-11-08: 1000 mL

## 2018-11-08 MED ORDER — SUGAMMADEX SODIUM 500 MG/5ML IV SOLN
INTRAVENOUS | Status: AC
  Filled 2018-11-08: qty 5

## 2018-11-08 MED ORDER — PROPOFOL 10 MG/ML IV BOLUS
INTRAVENOUS | Status: DC | PRN
  Administered 2018-11-08: 80 mg via INTRAVENOUS

## 2018-11-08 MED ORDER — IOPAMIDOL (ISOVUE-300) INJECTION 61%
INTRAVENOUS | Status: DC | PRN
  Administered 2018-11-08: 4 mL via INTRAVENOUS

## 2018-11-08 MED ORDER — ACETAMINOPHEN 10 MG/ML IV SOLN
1000.0000 mg | Freq: Once | INTRAVENOUS | Status: AC | PRN
  Administered 2018-11-08: 1000 mg via INTRAVENOUS

## 2018-11-08 MED ORDER — ENALAPRIL MALEATE 10 MG PO TABS
20.0000 mg | ORAL_TABLET | Freq: Every day | ORAL | Status: DC
  Administered 2018-11-10 – 2018-11-13 (×3): 20 mg via ORAL
  Filled 2018-11-08 (×5): qty 2

## 2018-11-08 MED ORDER — ROCURONIUM BROMIDE 10 MG/ML (PF) SYRINGE
PREFILLED_SYRINGE | INTRAVENOUS | Status: DC | PRN
  Administered 2018-11-08 (×2): 10 mg via INTRAVENOUS
  Administered 2018-11-08: 50 mg via INTRAVENOUS
  Administered 2018-11-08 (×3): 10 mg via INTRAVENOUS

## 2018-11-08 MED ORDER — PHENYLEPHRINE 40 MCG/ML (10ML) SYRINGE FOR IV PUSH (FOR BLOOD PRESSURE SUPPORT)
PREFILLED_SYRINGE | INTRAVENOUS | Status: AC
  Filled 2018-11-08: qty 10

## 2018-11-08 MED ORDER — LIDOCAINE 2% (20 MG/ML) 5 ML SYRINGE
INTRAMUSCULAR | Status: DC | PRN
  Administered 2018-11-08: 100 mg via INTRAVENOUS

## 2018-11-08 MED ORDER — BUPIVACAINE-EPINEPHRINE (PF) 0.25% -1:200000 IJ SOLN
INTRAMUSCULAR | Status: AC
  Filled 2018-11-08: qty 30

## 2018-11-08 MED ORDER — PIPERACILLIN-TAZOBACTAM 3.375 G IVPB
3.3750 g | Freq: Three times a day (TID) | INTRAVENOUS | Status: AC
  Administered 2018-11-08: 3.375 g via INTRAVENOUS
  Filled 2018-11-08 (×2): qty 50

## 2018-11-08 MED ORDER — MORPHINE SULFATE (PF) 2 MG/ML IV SOLN: 2.0000 mg | INTRAVENOUS | Status: DC | PRN

## 2018-11-08 MED ORDER — FENTANYL CITRATE (PF) 100 MCG/2ML IJ SOLN
INTRAMUSCULAR | Status: DC | PRN
  Administered 2018-11-08: 50 ug via INTRAVENOUS
  Administered 2018-11-08 (×2): 25 ug via INTRAVENOUS

## 2018-11-08 MED ORDER — EPHEDRINE 5 MG/ML INJ
INTRAVENOUS | Status: AC
  Filled 2018-11-08: qty 10

## 2018-11-08 MED ORDER — BUPIVACAINE-EPINEPHRINE 0.25% -1:200000 IJ SOLN
INTRAMUSCULAR | Status: DC | PRN
  Administered 2018-11-08: 30 mL

## 2018-11-08 MED ORDER — SODIUM CHLORIDE 0.9 % IV SOLN: INTRAVENOUS | Status: DC

## 2018-11-08 MED ORDER — ESMOLOL HCL 100 MG/10ML IV SOLN
INTRAVENOUS | Status: AC
  Filled 2018-11-08: qty 10

## 2018-11-08 MED ORDER — ASPIRIN EC 81 MG PO TBEC
81.0000 mg | DELAYED_RELEASE_TABLET | Freq: Every day | ORAL | Status: DC
  Administered 2018-11-09 – 2018-11-13 (×5): 81 mg via ORAL
  Filled 2018-11-08 (×5): qty 1

## 2018-11-08 MED ORDER — FENTANYL CITRATE (PF) 100 MCG/2ML IJ SOLN
25.0000 ug | INTRAMUSCULAR | Status: DC | PRN
  Administered 2018-11-08: 25 ug via INTRAVENOUS
  Administered 2018-11-08 (×2): 50 ug via INTRAVENOUS
  Administered 2018-11-08: 25 ug via INTRAVENOUS

## 2018-11-08 MED ORDER — SENNOSIDES-DOCUSATE SODIUM 8.6-50 MG PO TABS
1.0000 | ORAL_TABLET | Freq: Every evening | ORAL | Status: DC | PRN
  Administered 2018-11-10: 1 via ORAL
  Filled 2018-11-08: qty 1

## 2018-11-08 MED ORDER — FENTANYL CITRATE (PF) 100 MCG/2ML IJ SOLN
INTRAMUSCULAR | Status: AC
  Filled 2018-11-08: qty 4

## 2018-11-08 MED ORDER — SUGAMMADEX SODIUM 200 MG/2ML IV SOLN
INTRAVENOUS | Status: AC
  Filled 2018-11-08: qty 2

## 2018-11-08 MED ORDER — DEXAMETHASONE SODIUM PHOSPHATE 10 MG/ML IJ SOLN
INTRAMUSCULAR | Status: DC | PRN
  Administered 2018-11-08: 10 mg via INTRAVENOUS

## 2018-11-08 MED ORDER — 0.9 % SODIUM CHLORIDE (POUR BTL) OPTIME
TOPICAL | Status: DC | PRN
  Administered 2018-11-08: 1000 mL

## 2018-11-08 MED ORDER — ESMOLOL HCL 100 MG/10ML IV SOLN
INTRAVENOUS | Status: DC | PRN
  Administered 2018-11-08: 10 mg via INTRAVENOUS

## 2018-11-08 MED ORDER — LACTATED RINGERS IV SOLN
INTRAVENOUS | Status: DC | PRN
  Administered 2018-11-08: 12:00:00 via INTRAVENOUS

## 2018-11-08 MED ORDER — ACETAMINOPHEN 10 MG/ML IV SOLN
INTRAVENOUS | Status: AC
  Filled 2018-11-08: qty 100

## 2018-11-08 MED ORDER — ACETAMINOPHEN 500 MG PO TABS
1000.0000 mg | ORAL_TABLET | Freq: Three times a day (TID) | ORAL | Status: DC
  Administered 2018-11-08 – 2018-11-13 (×12): 1000 mg via ORAL
  Filled 2018-11-08 (×12): qty 2

## 2018-11-08 MED ORDER — EPHEDRINE SULFATE-NACL 50-0.9 MG/10ML-% IV SOSY
PREFILLED_SYRINGE | INTRAVENOUS | Status: DC | PRN
  Administered 2018-11-08 (×3): 5 mg via INTRAVENOUS

## 2018-11-08 MED ORDER — FENTANYL CITRATE (PF) 100 MCG/2ML IJ SOLN
INTRAMUSCULAR | Status: AC
  Filled 2018-11-08: qty 2

## 2018-11-08 MED ORDER — IOPAMIDOL (ISOVUE-300) INJECTION 61%
INTRAVENOUS | Status: AC
  Filled 2018-11-08: qty 50

## 2018-11-08 MED ORDER — SUGAMMADEX SODIUM 200 MG/2ML IV SOLN
INTRAVENOUS | Status: DC | PRN
  Administered 2018-11-08: 200 mg via INTRAVENOUS

## 2018-11-08 MED ORDER — PHENYLEPHRINE 40 MCG/ML (10ML) SYRINGE FOR IV PUSH (FOR BLOOD PRESSURE SUPPORT)
PREFILLED_SYRINGE | INTRAVENOUS | Status: DC | PRN
  Administered 2018-11-08: 80 ug via INTRAVENOUS

## 2018-11-08 MED ORDER — HEPARIN SODIUM (PORCINE) 5000 UNIT/ML IJ SOLN
5000.0000 [IU] | Freq: Three times a day (TID) | INTRAMUSCULAR | Status: AC
  Administered 2018-11-08: 5000 [IU] via SUBCUTANEOUS
  Filled 2018-11-08: qty 1

## 2018-11-08 SURGICAL SUPPLY — 59 items
ADH SKN CLS APL DERMABOND .7 (GAUZE/BANDAGES/DRESSINGS) ×1
APL SKNCLS STERI-STRIP NONHPOA (GAUZE/BANDAGES/DRESSINGS)
APL SRG 38 LTWT LNG FL B (MISCELLANEOUS)
APPLICATOR ARISTA FLEXITIP XL (MISCELLANEOUS) IMPLANT
APPLIER CLIP 5 13 M/L LIGAMAX5 (MISCELLANEOUS) ×3
APPLIER CLIP ROT 10 11.4 M/L (STAPLE)
APR CLP MED LRG 11.4X10 (STAPLE)
APR CLP MED LRG 5 ANG JAW (MISCELLANEOUS) ×1
BAG SPEC RTRVL 10 TROC 200 (ENDOMECHANICALS) ×1
BANDAGE ADH SHEER 1  50/CT (GAUZE/BANDAGES/DRESSINGS) ×12 IMPLANT
BENZOIN TINCTURE PRP APPL 2/3 (GAUZE/BANDAGES/DRESSINGS) IMPLANT
CABLE HIGH FREQUENCY MONO STRZ (ELECTRODE) ×3 IMPLANT
CHLORAPREP W/TINT 26ML (MISCELLANEOUS) ×3 IMPLANT
CLIP APPLIE 5 13 M/L LIGAMAX5 (MISCELLANEOUS) IMPLANT
CLIP APPLIE ROT 10 11.4 M/L (STAPLE) IMPLANT
CLIP VESOLOCK MED LG 6/CT (CLIP) IMPLANT
CLOSURE WOUND 1/2 X4 (GAUZE/BANDAGES/DRESSINGS)
COVER MAYO STAND STRL (DRAPES) ×2 IMPLANT
COVER SURGICAL LIGHT HANDLE (MISCELLANEOUS) ×3 IMPLANT
COVER WAND RF STERILE (DRAPES) ×2 IMPLANT
DECANTER SPIKE VIAL GLASS SM (MISCELLANEOUS) ×1 IMPLANT
DERMABOND ADVANCED (GAUZE/BANDAGES/DRESSINGS) ×2
DERMABOND ADVANCED .7 DNX12 (GAUZE/BANDAGES/DRESSINGS) IMPLANT
DRAPE C-ARM 42X120 X-RAY (DRAPES) ×2 IMPLANT
DRSG TEGADERM 2-3/8X2-3/4 SM (GAUZE/BANDAGES/DRESSINGS) IMPLANT
ELECT PENCIL ROCKER SW 15FT (MISCELLANEOUS) IMPLANT
ELECT REM PT RETURN 15FT ADLT (MISCELLANEOUS) ×3 IMPLANT
GAUZE SPONGE 2X2 8PLY STRL LF (GAUZE/BANDAGES/DRESSINGS) IMPLANT
GLOVE BIO SURGEON STRL SZ7.5 (GLOVE) ×3 IMPLANT
GLOVE INDICATOR 8.0 STRL GRN (GLOVE) ×3 IMPLANT
GOWN STRL REUS W/TWL XL LVL3 (GOWN DISPOSABLE) ×11 IMPLANT
GRASPER SUT TROCAR 14GX15 (MISCELLANEOUS) ×2 IMPLANT
HEMOSTAT ARISTA ABSORB 3G PWDR (MISCELLANEOUS) IMPLANT
HEMOSTAT SNOW SURGICEL 2X4 (HEMOSTASIS) IMPLANT
HEMOSTAT SURGICEL 4X8 (HEMOSTASIS) ×2 IMPLANT
KIT BASIN OR (CUSTOM PROCEDURE TRAY) ×3 IMPLANT
L-HOOK LAP DISP 36CM (ELECTROSURGICAL)
LHOOK LAP DISP 36CM (ELECTROSURGICAL) IMPLANT
POUCH RETRIEVAL ECOSAC 10 (ENDOMECHANICALS) ×1 IMPLANT
POUCH RETRIEVAL ECOSAC 10MM (ENDOMECHANICALS) ×2
SCISSORS LAP 5X35 DISP (ENDOMECHANICALS) ×3 IMPLANT
SET CHOLANGIOGRAPH MIX (MISCELLANEOUS) ×2 IMPLANT
SET IRRIG TUBING LAPAROSCOPIC (IRRIGATION / IRRIGATOR) ×3 IMPLANT
SLEEVE XCEL OPT CAN 5 100 (ENDOMECHANICALS) ×6 IMPLANT
SPONGE GAUZE 2X2 STER 10/PKG (GAUZE/BANDAGES/DRESSINGS)
STRIP CLOSURE SKIN 1/2X4 (GAUZE/BANDAGES/DRESSINGS) IMPLANT
SUT MNCRL AB 4-0 PS2 18 (SUTURE) ×5 IMPLANT
SUT VIC AB 0 UR5 27 (SUTURE) IMPLANT
SUT VIC AB 3-0 SH 27 (SUTURE) ×3
SUT VIC AB 3-0 SH 27XBRD (SUTURE) IMPLANT
SUT VICRYL 0 TIES 12 18 (SUTURE) ×2 IMPLANT
SUT VICRYL 0 UR6 27IN ABS (SUTURE) IMPLANT
TOWEL OR 17X26 10 PK STRL BLUE (TOWEL DISPOSABLE) ×3 IMPLANT
TOWEL OR NON WOVEN STRL DISP B (DISPOSABLE) ×3 IMPLANT
TRAY LAPAROSCOPIC (CUSTOM PROCEDURE TRAY) ×3 IMPLANT
TROCAR BLADELESS OPT 5 100 (ENDOMECHANICALS) ×3 IMPLANT
TROCAR XCEL BLUNT TIP 100MML (ENDOMECHANICALS) ×2 IMPLANT
TROCAR XCEL NON-BLD 11X100MML (ENDOMECHANICALS) IMPLANT
TUBING INSUF HEATED (TUBING) ×3 IMPLANT

## 2018-11-08 NOTE — Anesthesia Postprocedure Evaluation (Signed)
Anesthesia Post Note  Patient: [name]  Procedure(s) Performed: LAPAROSCOPIC CHOLECYSTECTOMY WITH INTRAOPERATIVE CHOLANGIOGRAM (N/A Abdomen)     Patient location during evaluation: PACU Anesthesia Type: General Level of consciousness: awake and alert Pain management: pain level controlled Vital Signs Assessment: post-procedure vital signs reviewed and stable Respiratory status: spontaneous breathing, nonlabored ventilation, respiratory function stable and patient connected to nasal cannula oxygen Cardiovascular status: blood pressure returned to baseline and stable Postop Assessment: no apparent nausea or vomiting Anesthetic complications: no    Last Vitals:  Vitals:   11/08/18 1645 11/08/18 1711  BP: (!) 149/88 (!) 146/93  Pulse: 83 80  Resp: (!) 22 18  Temp: 36.6 C (!) 36.3 C  SpO2: 94% 95%    Last Pain:  Vitals:   11/08/18 1711  TempSrc: Oral  PainSc:                  [name] [name]

## 2018-11-08 NOTE — Progress Notes (Signed)
[id number] discomfort,abdominal discomfort, anxious, urinary frequency, disorientation, fever and chills  Subjective: He was a little nauseated earlier this AM, but no pain.  He understands we plan to do surgery late today.    Objective: Vital signs in last 24 hours: Temp:  [97.5 F (36.4 C)-98.2 F (36.8 C)] 97.5 F (36.4 C) (12/12 0533) Pulse Rate:  [60-64] 60 (12/12 0533) Resp:  [16-17] 16 (12/12 0533) BP: (121-142)/(68-77) 121/68 (12/12 0533) SpO2:  [98 %-100 %] 98 % (12/12 0533) Last BM Date: 11/06/18 1039 IV 1125 urine No BM Afebrile, yesterday VSS WBC 4.4, H/H down some Creatinine is up some - will defer to Dr. Herbert Moors LFT's better, Bilirubin down 2.0 >> 3.4 >> 2.8 this AM AST/ALT better  Intake/Output from previous day: 12/11 0701 - 12/12 0700 In: 1039.7 [I.V.:894.8; IV Piggyback:144.9] Out: 3419 [Urine:1125] Intake/Output this shift: No intake/output data recorded.  General appearance: alert, cooperative and no distress Resp: clear to auscultation bilaterally GI: soft denies any tenderness, no distension, no rebound, few BS  Lab Results:  Recent Labs    11/07/18 0332 11/08/18 0405  WBC 5.6 4.4  HGB 13.1 11.8*  HCT 40.5 36.8*  PLT 135* 120*    BMET Recent Labs    11/07/18 0332 11/08/18 0405  NA 143 141  K 4.3 4.1  CL 111 110  CO2 25 23  GLUCOSE 112* 86  BUN 19 16  CREATININE 1.12 1.30*  CALCIUM 9.1 8.7*   PT/INR Recent Labs    11/06/18 1855  LABPROT 13.3  INR 1.02    Recent Labs  Lab 11/06/18 1855 11/07/18 0332 11/08/18 0405  AST 744* 538* 218*  ALT 421* 420* 247*  ALKPHOS 103 86 71  BILITOT 2.0* 3.4* 2.8*  PROT 7.4 6.7 6.0*  ALBUMIN 4.3 3.7 3.3*     Lipase     Component Value Date/Time   LIPASE 27 11/07/2018 0332     Medications: . amLODipine  5 mg Oral Daily  . aspirin EC  81 mg Oral Daily  . enalapril  20 mg Oral Daily  . gabapentin  200 mg Oral q morning - 10a  . gabapentin  300 mg Oral QHS  .  levothyroxine  112 mcg Oral QAC breakfast  . pantoprazole  40 mg Oral Q1200  . vitamin B-12  1,000 mcg Oral Daily   . sodium chloride 50 mL/hr at 11/07/18 1515  . piperacillin-tazobactam (ZOSYN)  IV 3.375 g (11/08/18 0434)   Anti-infectives (From admission, onward)   Start     Dose/Rate Route Frequency Ordered Stop   11/07/18 0500  piperacillin-tazobactam (ZOSYN) IVPB 3.375 g     3.375 g 12.5 mL/hr over 240 Minutes Intravenous Every 8 hours 11/07/18 0259     11/06/18 2000  vancomycin (VANCOCIN) 2,000 mg in sodium chloride 0.9 % 500 mL IVPB     2,000 mg 250 mL/hr over 120 Minutes Intravenous  Once 11/06/18 1942 11/07/18 0211   11/06/18 1945  ceFEPIme (MAXIPIME) 2 g in sodium chloride 0.9 % 100 mL IVPB     2 g 200 mL/hr over 30 Minutes Intravenous  Once 11/06/18 1939 11/06/18 2127   11/06/18 1945  metroNIDAZOLE (FLAGYL) IVPB 500 mg  Status:  Discontinued     500 mg 100 mL/hr over 60 Minutes Intravenous Every 8 hours 11/06/18 1939 11/07/18 0233   11/06/18 1945  vancomycin (VANCOCIN) IVPB 1000 mg/200 mL premix  Status:  Discontinued     1,000 mg 200 mL/hr over 60 Minutes  Intravenous  Once 11/06/18 1939 11/06/18 1942      Assessment/Plan Hx non-Hodgkin's lymphoma left neck with prior excisional left neck biopsy Hx malaria -[place] World War II Hypertension Venous stasis disease -bilateral Hx peripheral neuropathy Hypothyroid on supplement Hx GERD Thrombocytopenia DNR CKD /dehydration - mild  - creatinine up  1.2 >>1.12 >> 1.3 today  Abdominal pain with fever/sepsis Cholelithiasis with gallbladder wall thickening CBD 3 mm, gallbladder wall edema on CT, elevated LFTs.   FEN:  IV fluids/NPO ID: Vancomycin/Flagyl/Maxipime x 12/10;  Zosyn 12/11 =>> day 2 DVT:  SCD's, Heparin x 1 dose last PM, on hold till surgery Follow up:  [facility]  Plan: surgery later today.      LOS: 1 day    [name] 11/08/2018 [phone]

## 2018-11-08 NOTE — Plan of Care (Signed)

## 2018-11-08 NOTE — Op Note (Signed)
NAVY BELAY [id number] [birth year] 11/08/2018  Laparoscopic Cholecystectomy with IOC Procedure Note  Indications: This patient presents with symptomatic gallbladder disease and will undergo laparoscopic cholecystectomy.  Pre-operative Diagnosis: Calculus of gallbladder with acute cholecystitis, without mention of obstruction  Post-operative Diagnosis: Same  Surgeon: [name] MD FACS  Assistants: Will [employer] (an assistant was required due to the acute inflammation and degree of inflammation of the gallbladder and to aid in retraction of adjacent tissues)  Anesthesia: General endotracheal anesthesia  Procedure Details  The patient was seen again in the Holding Room. The risks, benefits, complications, treatment options, and expected outcomes were discussed with the patient. The possibilities of reaction to medication, pulmonary aspiration, perforation of viscus, bleeding, recurrent infection, finding a normal gallbladder, the need for additional procedures, failure to diagnose a condition, the possible need to convert to an open procedure, and creating a complication requiring transfusion or operation were discussed with the patient. The likelihood of improving the patient's symptoms with return to their baseline status is good.  The patient and/or family concurred with the proposed plan, giving informed consent. The site of surgery properly noted. The patient was taken to Operating Room, identified as [name] and the procedure verified as Laparoscopic Cholecystectomy with Intraoperative Cholangiogram. A Time Out was held and the above information confirmed. Antibiotic prophylaxis was administered.   Prior to the induction of general anesthesia, antibiotic prophylaxis was administered. General endotracheal anesthesia was then administered and tolerated well. After the induction, the abdomen was prepped with Chloraprep and draped in the sterile fashion. The patient was positioned in  the supine position.  Local anesthetic agent was injected into the skin near the umbilicus and an incision made. We dissected down to the abdominal fascia with blunt dissection.  The patient had a small pre-existing umbilical fascial defect of about half a centimeter.  The fascia was further incised vertically and we entered the peritoneal cavity bluntly.  A pursestring suture of 0-Vicryl was placed around the fascial opening.  The Hasson cannula was inserted and secured with the stay suture.  Pneumoperitoneum was then created with CO2 and tolerated well without any adverse changes in the patient's vital signs. An 5-mm port was placed in the subxiphoid position.  Two 5-mm ports were placed in the right upper quadrant. All skin incisions were infiltrated with a local anesthetic agent before making the incision and placing the trocars.   We positioned the patient in reverse Trendelenburg, tilted slightly to the patient's left.  The gallbladder was identified.  The gallbladder was edematous and distended.  It was aspirated in order to facilitate retraction. the fundus grasped and retracted cephalad. Adhesions were lysed bluntly and with the electrocautery where indicated, taking care not to injure any adjacent organs or viscus. The infundibulum was grasped and retracted laterally, exposing the peritoneum overlying the triangle of Calot. This was then divided and exposed in a blunt fashion. A critical view of the cystic duct and cystic artery was obtained.  The cystic duct was clearly identified and bluntly dissected circumferentially. The cystic duct was ligated with a clip distally.   An incision was made in the cystic duct and the [facility] cholangiogram catheter introduced. The catheter was secured using a clip. A cholangiogram was then obtained which showed good visualization of the distal and proximal biliary tree with no sign of filling defects or obstruction.  Contrast flowed easily into the duodenum. The  catheter was then removed.   The cystic duct was then ligated  with clips and divided. The cystic artery which had been identified & dissected free was ligated with clips and divided as well.   The gallbladder was dissected from the liver bed in retrograde fashion with the electrocautery.  There was spillage of gallstones from the gallbladder.  The subxiphoid trocar was upsized to a 10 mm trocar.  The stone extractor was used to remove the spilled gallstones.  The gallbladder was removed and placed in an Ecco sac.  The gallbladder and Ecco sac were then removed through the umbilical port site. The liver bed was irrigated and inspected. Hemostasis was achieved with the electrocautery. Copious irrigation was utilized and was repeatedly aspirated until clear.  I did place a piece of Surgicel in the gallbladder fossa.  The pursestring suture was used to close the umbilical fascia.  An additional interrupted 0 Vicryl suture was placed at the umbilical fascia with a PMI suture passer since he had a pre-existing small fascial defect.  We again inspected the right upper quadrant for hemostasis.  The umbilical closure was inspected and there was no air leak and nothing trapped within the closure.  A single interrupted 0 Vicryl suture was placed in the subxiphoid trocar location using the PMI suture passer with laparoscopic assistance.  Pneumoperitoneum was released as we removed the trocars.  4-0 Monocryl was used to close the skin.   Dermabond was applied. The patient was then extubated and brought to the recovery room in stable condition. Instrument, sponge, and needle counts were correct at closure and at the conclusion of the case.   Findings: Cholecystitis with Cholelithiasis  Estimated Blood Loss: less than 50 mL         Drains: none         Specimens: Gallbladder           Complications: None; patient tolerated the procedure well.         Disposition: PACU - hemodynamically stable.          Condition: stable  Jimmy Ruff. [name], MD, FACS General, Bariatric, & Minimally Invasive Surgery [facility], [place]

## 2018-11-08 NOTE — Progress Notes (Signed)
Patient returned from [place] with active delerium. Very confused , hallucinating and calling out. Comforted patient, informed family and will cont to monitor

## 2018-11-08 NOTE — Plan of Care (Signed)

## 2018-11-08 NOTE — Anesthesia Preprocedure Evaluation (Addendum)
Anesthesia Evaluation  Patient identified by MRN, date of birth, ID band Patient awake    Reviewed: Allergy & Precautions, NPO status , Patient's Chart, lab work & pertinent test results  Airway Mallampati: I  TM Distance: >3 FB Neck ROM: Full    Dental  (+) Edentulous Upper, Edentulous Lower   Pulmonary neg pulmonary ROS, former smoker,    Pulmonary exam normal breath sounds clear to auscultation       Cardiovascular hypertension, Pt. on medications + Peripheral Vascular Disease  Normal cardiovascular exam Rhythm:Regular Rate:Normal     Neuro/Psych negative neurological ROS  negative psych ROS   GI/Hepatic Neg liver ROS, GERD  ,  Endo/Other  Hypothyroidism   Renal/GU Renal InsufficiencyRenal diseaseStage II  negative genitourinary   Musculoskeletal negative musculoskeletal ROS (+)   Abdominal   Peds  Hematology negative hematology ROS (+) Non-hodgkin's lymphoma   Anesthesia Other Findings Acute cholecystitis  Reproductive/Obstetrics                            Anesthesia Physical Anesthesia Plan  ASA: III  Anesthesia Plan: General   Post-op Pain Management:    Induction: Intravenous  PONV Risk Score and Plan: 2 and Ondansetron, Dexamethasone and Treatment may vary due to age or medical condition  Airway Management Planned: Oral ETT  Additional Equipment:   Intra-op Plan:   Post-operative Plan: Extubation in OR  Informed Consent: I have reviewed the patients History and Physical, chart, labs and discussed the procedure including the risks, benefits and alternatives for the proposed anesthesia with the patient or authorized representative who has indicated his/her understanding and acceptance.   Dental advisory given  Plan Discussed with: CRNA  Anesthesia Plan Comments: (Spoke to patient, agree to temporarily suspend DNR peri-operatively)       Anesthesia Quick  Evaluation

## 2018-11-08 NOTE — Interval H&P Note (Signed)
History and Physical Interval Note:  11/08/2018 1:16 PM  [name]  has presented today for surgery, with the diagnosis of ACUTE CALCULOUS CHOLECYSTITITS  The various methods of treatment have been discussed with the patient and family. After consideration of risks, benefits and other options for treatment, the patient has consented to  Procedure(s): LAPAROSCOPIC CHOLECYSTECTOMY WITH INTRAOPERATIVE CHOLANGIOGRAM (N/A) as a surgical intervention .  The patient's history has been reviewed, patient examined, no change in status, stable for surgery.  I have reviewed the patient's chart and labs.  Questions were answered to the patient's satisfaction.    [name]. [name], MD, FACS General, Bariatric, & Minimally Invasive Surgery [facility]  [name]

## 2018-11-08 NOTE — H&P (View-Only) (Signed)
EH:MCNOB discomfort,abdominal discomfort, anxious, urinary frequency, disorientation, fever and chills  Subjective: He was a little nauseated earlier this AM, but no pain.  He understands we plan to do surgery late today.    Objective: Vital signs in last 24 hours: Temp:  [97.5 F (36.4 C)-98.2 F (36.8 C)] 97.5 F (36.4 C) (12/12 0533) Pulse Rate:  [60-64] 60 (12/12 0533) Resp:  [16-17] 16 (12/12 0533) BP: (121-142)/(68-77) 121/68 (12/12 0533) SpO2:  [98 %-100 %] 98 % (12/12 0533) Last BM Date: 11/06/18 1039 IV 1125 urine No BM Afebrile, yesterday VSS WBC 4.4, H/H down some Creatinine is up some - will defer to Dr. Herbert Moors LFT's better, Bilirubin down 2.0 >> 3.4 >> 2.8 this AM AST/ALT better  Intake/Output from previous day: 12/11 0701 - 12/12 0700 In: 1039.7 [I.V.:894.8; IV Piggyback:144.9] Out: 0962 [Urine:1125] Intake/Output this shift: No intake/output data recorded.  General appearance: alert, cooperative and no distress Resp: clear to auscultation bilaterally GI: soft denies any tenderness, no distension, no rebound, few BS  Lab Results:  Recent Labs    11/07/18 0332 11/08/18 0405  WBC 5.6 4.4  HGB 13.1 11.8*  HCT 40.5 36.8*  PLT 135* 120*    BMET Recent Labs    11/07/18 0332 11/08/18 0405  NA 143 141  K 4.3 4.1  CL 111 110  CO2 25 23  GLUCOSE 112* 86  BUN 19 16  CREATININE 1.12 1.30*  CALCIUM 9.1 8.7*   PT/INR Recent Labs    11/06/18 1855  LABPROT 13.3  INR 1.02    Recent Labs  Lab 11/06/18 1855 11/07/18 0332 11/08/18 0405  AST 744* 538* 218*  ALT 421* 420* 247*  ALKPHOS 103 86 71  BILITOT 2.0* 3.4* 2.8*  PROT 7.4 6.7 6.0*  ALBUMIN 4.3 3.7 3.3*     Lipase     Component Value Date/Time   LIPASE 27 11/07/2018 0332     Medications: . amLODipine  5 mg Oral Daily  . aspirin EC  81 mg Oral Daily  . enalapril  20 mg Oral Daily  . gabapentin  200 mg Oral q morning - 10a  . gabapentin  300 mg Oral QHS  .  levothyroxine  112 mcg Oral QAC breakfast  . pantoprazole  40 mg Oral Q1200  . vitamin B-12  1,000 mcg Oral Daily   . sodium chloride 50 mL/hr at 11/07/18 1515  . piperacillin-tazobactam (ZOSYN)  IV 3.375 g (11/08/18 0434)   Anti-infectives (From admission, onward)   Start     Dose/Rate Route Frequency Ordered Stop   11/07/18 0500  piperacillin-tazobactam (ZOSYN) IVPB 3.375 g     3.375 g 12.5 mL/hr over 240 Minutes Intravenous Every 8 hours 11/07/18 0259     11/06/18 2000  vancomycin (VANCOCIN) 2,000 mg in sodium chloride 0.9 % 500 mL IVPB     2,000 mg 250 mL/hr over 120 Minutes Intravenous  Once 11/06/18 1942 11/07/18 0211   11/06/18 1945  ceFEPIme (MAXIPIME) 2 g in sodium chloride 0.9 % 100 mL IVPB     2 g 200 mL/hr over 30 Minutes Intravenous  Once 11/06/18 1939 11/06/18 2127   11/06/18 1945  metroNIDAZOLE (FLAGYL) IVPB 500 mg  Status:  Discontinued     500 mg 100 mL/hr over 60 Minutes Intravenous Every 8 hours 11/06/18 1939 11/07/18 0233   11/06/18 1945  vancomycin (VANCOCIN) IVPB 1000 mg/200 mL premix  Status:  Discontinued     1,000 mg 200 mL/hr over 60 Minutes  Intravenous  Once 11/06/18 1939 11/06/18 1942      Assessment/Plan Hx non-Hodgkin's lymphoma left neck with prior excisional left neck biopsy Hx malaria -[place] World War II Hypertension Venous stasis disease -bilateral Hx peripheral neuropathy Hypothyroid on supplement Hx GERD Thrombocytopenia DNR CKD /dehydration - mild  - creatinine up  1.2 >>1.12 >> 1.3 today  Abdominal pain with fever/sepsis Cholelithiasis with gallbladder wall thickening CBD 3 mm, gallbladder wall edema on CT, elevated LFTs.   FEN:  IV fluids/NPO ID: Vancomycin/Flagyl/Maxipime x 12/10;  Zosyn 12/11 =>> day 2 DVT:  SCD's, Heparin x 1 dose last PM, on hold till surgery Follow up:  [facility]  Plan: surgery later today.      LOS: 1 day    Nomar Broad 11/08/2018 [phone]

## 2018-11-08 NOTE — Transfer of Care (Signed)
Immediate Anesthesia Transfer of Care Note  Patient: [name]  Procedure(s) Performed: LAPAROSCOPIC CHOLECYSTECTOMY WITH INTRAOPERATIVE CHOLANGIOGRAM (N/A Abdomen)  Patient Location: PACU  Anesthesia Type:General  Level of Consciousness: awake and patient cooperative  Airway & Oxygen Therapy: Patient Spontanous Breathing and Patient connected to face mask oxygen  Post-op Assessment: Report given to RN and Post -op Vital signs reviewed and stable  Post vital signs: Reviewed and stable  Last Vitals:  Vitals Value Taken Time  BP 153/86 11/08/2018  3:31 PM  Temp    Pulse 77 11/08/2018  3:36 PM  Resp 26 11/08/2018  3:36 PM  SpO2 94 % 11/08/2018  3:36 PM  Vitals shown include unvalidated device data.  Last Pain:  Vitals:   11/08/18 1250  TempSrc: Oral  PainSc:       Patients Stated Pain Goal: 2 (09/62/83 6629)  Complications: No apparent anesthesia complications

## 2018-11-08 NOTE — Progress Notes (Signed)
PROGRESS NOTE    [name]  ZLD:[id number] DOB: 1919/11/11 DOA: 11/06/2018 PCP: Susy Frizzle, MD   Brief Narrative:  84 year old with past medical history relevant for non-Hodgkin's lymphoma in remission, hypertension, hypothyroidism, chronic pain, reflux, stage II CKD admitted with fever and vague reports of abdominal pain and found to have possible acute cholecystitis on CT and right upper quadrant ultrasound.   Assessment & Plan:   Principal Problem:   Cholecystitis, acute Active Problems:   Acid reflux   Hypertension   Gallstone   Hypothyroidism   CKD (chronic kidney disease) stage 3, GFR 30-59 ml/min (HCC)   Acute cholecystitis   Abnormal liver function   NHL (non-Hodgkin's lymphoma) ([place])   #) Fever/ acute cholecystitis: Patient being taken to the OR today -N.p.o. -Continue IV Zosyn -General surgery consult, appreciate recommendations -HIDA scan shows evidence of acute cholecystitis  #) Hypertension: -Continue midodrine 5 mg daily -Continue aspirin 81 mg -Continue enalapril 20 mg daily  #) Chronic venous stasis dermatitis: -Hold furosemide 40 mg daily  #) Hypothyroidism: -Continue levothyroxine 112 mcg daily  #) Pain/psych: -Continue gabapentin 200 mg every morning and 300 mg nightly  #) Stage II CKD: -Stable  Fluids: IV fluids Electrolytes: Monitor and supplement Nutrition: N.p.o.  Prophylaxis: SCDs  Disposition: Pending OR for cholecystectomy  DNR   Consultants:   General surgery  Procedures:   11/07/2018 HIDA scan: Pending  Antimicrobials:   IV Zosyn started 11/07/2018   Subjective: This morning the patient reports he is feeling well.  He reports some constipation but denies any nausea, vomiting, abdominal pain, cough, congestion, diarrhea.  He denies any chest pain or shortness of breath.  Objective: Vitals:   11/07/18 1008 11/07/18 1836 11/07/18 2140 11/08/18 0533  BP: (!) 142/76 130/74 136/77 121/68  Pulse: 63 64 61  60  Resp: 16  17 16   Temp: 98.2 F (36.8 C) 98 F (36.7 C) (!) 97.5 F (36.4 C) (!) 97.5 F (36.4 C)  TempSrc: Oral Oral Oral Oral  SpO2: 99% 98% 100% 98%  Weight:      Height:        Intake/Output Summary (Last 24 hours) at 11/08/2018 0950 Last data filed at 11/08/2018 0925 Gross per 24 hour  Intake 1039.7 ml  Output 1275 ml  Net -235.3 ml   Filed Weights   11/06/18 1856 11/07/18 0431  Weight: 90.7 kg 97.9 kg    Examination:  General exam: Appears calm and comfortable  Respiratory system: Clear to auscultation. Respiratory effort normal. Cardiovascular system: Regular rate and rhythm, no murmurs Gastrointestinal system: Soft, nondistended, no right upper quadrant tenderness, no rebound or guarding. Central nervous system: Alert and oriented.  Grossly intact, moving all extremities Extremities: Trace lower extremity edema. Skin: Chronic venous stasis dermatitis Psychiatry: Judgement and insight appear normal. Mood & affect appropriate.     Data Reviewed: I have personally reviewed following labs and imaging studies  CBC: Recent Labs  Lab 11/06/18 1855 11/07/18 0332 11/08/18 0405  WBC 4.3 5.6 4.4  NEUTROABS 3.4  --   --   HGB 14.4 13.1 11.8*  HCT 45.4 40.5 36.8*  MCV 96.6 97.4 98.1  PLT 134* 135* 903*   Basic Metabolic Panel: Recent Labs  Lab 11/06/18 1855 11/07/18 0332 11/08/18 0405  NA 142 143 141  K 4.3 4.3 4.1  CL 109 111 110  CO2 21* 25 23  GLUCOSE 144* 112* 86  BUN 22 19 16   CREATININE 1.20 1.12 1.30*  CALCIUM 9.0 9.1  8.7*  MG  --   --  1.8   GFR: Estimated Creatinine Clearance: 37.8 mL/min (A) (by C-G formula based on SCr of 1.3 mg/dL (H)). Liver Function Tests: Recent Labs  Lab 11/06/18 1855 11/07/18 0332 11/08/18 0405  AST 744* 538* 218*  ALT 421* 420* 247*  ALKPHOS 103 86 71  BILITOT 2.0* 3.4* 2.8*  PROT 7.4 6.7 6.0*  ALBUMIN 4.3 3.7 3.3*   Recent Labs  Lab 11/06/18 1855 11/07/18 0332  LIPASE 30 27   No results for  input(s): AMMONIA in the last 168 hours. Coagulation Profile: Recent Labs  Lab 11/06/18 1855  INR 1.02   Cardiac Enzymes: No results for input(s): CKTOTAL, CKMB, CKMBINDEX, TROPONINI in the last 168 hours. BNP (last 3 results) No results for input(s): PROBNP in the last 8760 hours. HbA1C: No results for input(s): HGBA1C in the last 72 hours. CBG: No results for input(s): GLUCAP in the last 168 hours. Lipid Profile: No results for input(s): CHOL, HDL, LDLCALC, TRIG, CHOLHDL, LDLDIRECT in the last 72 hours. Thyroid Function Tests: No results for input(s): TSH, T4TOTAL, FREET4, T3FREE, THYROIDAB in the last 72 hours. Anemia Panel: No results for input(s): VITAMINB12, FOLATE, FERRITIN, TIBC, IRON, RETICCTPCT in the last 72 hours. Sepsis Labs: Recent Labs  Lab 11/06/18 1856  LATICACIDVEN 0.81    No results found for this or any previous visit (from the past 240 hour(s)).       Radiology Studies: Dg Chest 2 View  Result Date: 11/06/2018 CLINICAL DATA:  Cough EXAM: CHEST - 2 VIEW COMPARISON:  09/02/2013, CT chest 04/08/2014 FINDINGS: Low lung volumes. Subsegmental atelectasis at the left base. No pleural effusion. Mild cardiomegaly. Moderate hiatal hernia. IMPRESSION: 1. Mild cardiomegaly with subsegmental atelectasis at the left base. 2. Moderate hiatal hernia Electronically Signed   By: Donavan Foil M.D.   On: 11/06/2018 19:55   Nm Hepatobiliary Liver Func  Result Date: 11/07/2018 CLINICAL DATA:  RIGHT upper quadrant pain, fever, leukocytosis. Cholelithiasis and gallbladder wall thickening by ultrasound question acute cholecystitis, mildly thickened gallbladder wall by CT; bilirubin 3.4 EXAM: NUCLEAR MEDICINE HEPATOBILIARY IMAGING TECHNIQUE: Sequential images of the abdomen were obtained out to 60 minutes following intravenous administration of radiopharmaceutical. RADIOPHARMACEUTICALS:  7.5 mCi Tc-72m  Choletec IV COMPARISON:  Ultrasound abdomen 11/06/2018, CT abdomen and  pelvis 11/06/2018 FINDINGS: Delayed clearance of tracer from bloodstream indicating hepatocellular dysfunction. Delayed excretion of tracer into biliary tree. Gallbladder did not visualized during the first hour of imaging. Small bowel visualized at approximately 100 minutes. Delayed imaging of the gallbladder over an additional lower also fails to demonstrate tracer within the gallbladder. Significant tracer retention is seen within the liver parenchyma at the conclusion of 2 hours of imaging. IMPRESSION: Nonvisualization of the gallbladder over 2 hours consistent with acute cholecystitis. Patent CBD. Prolonged retention of tracer within hepatic parenchyma consistent with hepatocellular dysfunction. Electronically Signed   By: Lavonia Dana M.D.   On: 11/07/2018 15:02   Ct Abdomen Pelvis W Contrast  Result Date: 11/06/2018 CLINICAL DATA:  84 year old male with abdominal distention. EXAM: CT ABDOMEN AND PELVIS WITH CONTRAST TECHNIQUE: Multidetector CT imaging of the abdomen and pelvis was performed using the standard protocol following bolus administration of intravenous contrast. CONTRAST:  154mL ISOVUE-300 IOPAMIDOL (ISOVUE-300) INJECTION 61% COMPARISON:  CT dated 08/30/2013 FINDINGS: Lower chest: There are bibasilar interstitial coarsening may represent chronic changes and possible superimposed edema. Clinical correlation is recommended. Trace bilateral pleural effusions noted. There is multi vessel coronary vascular calcification. No intra-abdominal free  air or free fluid. Hepatobiliary: The liver is unremarkable. No intrahepatic biliary ductal dilatation. There is mild gallbladder wall edema. No calcified stone. No pericholecystic fluid. Ultrasound may provide better evaluation of the gallbladder. Pancreas: Unremarkable. No pancreatic ductal dilatation or surrounding inflammatory changes. Spleen: Normal in size without focal abnormality. Adrenals/Urinary Tract: The adrenal glands are unremarkable. There is  no hydronephrosis on either side. There is symmetric enhancement and excretion of contrast by both kidneys. The visualized ureters and urinary bladder are unremarkable. Stomach/Bowel: There is extensive sigmoid and diffuse colonic diverticulosis without active inflammatory changes. There is a moderate size hiatal hernia. There is no bowel obstruction or active inflammation. Normal appendix. Vascular/Lymphatic: Moderate aortoiliac atherosclerotic disease. There is a 2.7 cm infrarenal aortic ectasia. The IVC is grossly unremarkable. No portal venous gas. Multiple small retroperitoneal lymph nodes. No adenopathy. Reproductive: Enlarged prostate gland measuring 5.3 cm in transverse axial diameter. The seminal vesicles are symmetric. Other: None Musculoskeletal: Osteopenia with multilevel degenerative changes of the spine and lower thoracic kyphosis. T12 lucent lesion appears similar to prior CT of 2014, likely benign. No acute osseous pathology. IMPRESSION: 1. Mild gallbladder wall edema. No calcified stone. Ultrasound may provide better evaluation of the gallbladder. 2. Extensive colonic diverticulosis without active inflammatory changes. No bowel obstruction or active inflammation. Normal appendix. 3. Moderate size hiatal hernia. 4. Bibasilar interstitial coarsening may represent chronic changes and possible superimposed edema. Clinical correlation is recommended. 5. Multi vessel coronary vascular calcification. Electronically Signed   By: Anner Crete M.D.   On: 11/06/2018 22:36   US Abdomen Limited Ruq  Result Date: 11/07/2018 CLINICAL DATA:  84 y/o  M; nausea and vomiting. EXAM: ULTRASOUND ABDOMEN LIMITED RIGHT UPPER QUADRANT COMPARISON:  11/06/2018 CT abdomen and pelvis. FINDINGS: Gallbladder: Multiple echogenic gallstones with distal acoustic shadowing. Gallbladder wall thickening to 4 mm. Negative sonographic Murphy's sign. No pericholecystic fluid. Common bile duct: Diameter: 3 mm Liver: No focal  lesion identified. Within normal limits in parenchymal echogenicity. Portal vein is patent on color Doppler imaging with normal direction of blood flow towards the liver. IMPRESSION: Cholelithiasis and gallbladder wall thickening may represent acute cholecystitis in the appropriate clinical setting. Electronically Signed   By: Kristine Garbe M.D.   On: 11/07/2018 00:06        Scheduled Meds: . amLODipine  5 mg Oral Daily  . aspirin EC  81 mg Oral Daily  . enalapril  20 mg Oral Daily  . gabapentin  200 mg Oral q morning - 10a  . gabapentin  300 mg Oral QHS  . levothyroxine  112 mcg Oral QAC breakfast  . pantoprazole  40 mg Oral Q1200  . vitamin B-12  1,000 mcg Oral Daily   Continuous Infusions: . sodium chloride 50 mL/hr at 11/07/18 1515  . piperacillin-tazobactam (ZOSYN)  IV 3.375 g (11/08/18 0434)     LOS: 1 day    Time spent: 41    Cristy Folks, MD [facility]  If 7PM-7AM, please contact night-coverage [email or web address] Password [email or web address] 11/08/2018, 9:50 AM

## 2018-11-08 NOTE — Anesthesia Procedure Notes (Signed)
Procedure Name: Intubation Date/Time: 11/08/2018 2:05 PM Performed by: Lavina Hamman, CRNA Pre-anesthesia Checklist: Patient identified, Emergency Drugs available, Suction available, Patient being monitored and Timeout performed Patient Re-evaluated:Patient Re-evaluated prior to induction Oxygen Delivery Method: Circle system utilized Preoxygenation: Pre-oxygenation with 100% oxygen Induction Type: IV induction Ventilation: Mask ventilation without difficulty Laryngoscope Size: Mac and 4 Grade View: Grade I Tube type: Oral Tube size: 7.5 mm Number of attempts: 1 Airway Equipment and Method: Stylet Placement Confirmation: ETT inserted through vocal cords under direct vision,  positive ETCO2,  CO2 detector and breath sounds checked- equal and bilateral Secured at: 23 cm Tube secured with: Tape Dental Injury: Teeth and Oropharynx as per pre-operative assessment

## 2018-11-09 ENCOUNTER — Encounter (HOSPITAL_COMMUNITY): Payer: Self-pay | Admitting: General Surgery

## 2018-11-09 LAB — COMPREHENSIVE METABOLIC PANEL
ALT: 199 U/L — ABNORMAL HIGH (ref 0–44)
AST: 139 U/L — ABNORMAL HIGH (ref 15–41)
Albumin: 3.5 g/dL (ref 3.5–5.0)
Alkaline Phosphatase: 70 U/L (ref 38–126)
Anion gap: 12 (ref 5–15)
BUN: 17 mg/dL (ref 8–23)
CO2: 20 mmol/L — ABNORMAL LOW (ref 22–32)
Calcium: 9 mg/dL (ref 8.9–10.3)
Chloride: 106 mmol/L (ref 98–111)
Creatinine, Ser: 1.28 mg/dL — ABNORMAL HIGH (ref 0.61–1.24)
GFR calc Af Amer: 54 mL/min — ABNORMAL LOW (ref >60)
GFR calc non Af Amer: 47 mL/min — ABNORMAL LOW (ref >60)
Glucose, Bld: 188 mg/dL — ABNORMAL HIGH (ref 70–99)
Potassium: 4.7 mmol/L (ref 3.5–5.1)
Sodium: 138 mmol/L (ref 135–145)
Total Bilirubin: 1.6 mg/dL — ABNORMAL HIGH (ref 0.3–1.2)
Total Protein: 6.3 g/dL — ABNORMAL LOW (ref 6.5–8.1)

## 2018-11-09 LAB — CBC
HCT: 42.5 % (ref 39.0–52.0)
Hemoglobin: 13.7 g/dL (ref 13.0–17.0)
MCH: 31.4 pg (ref 26.0–34.0)
MCHC: 32.2 g/dL (ref 30.0–36.0)
MCV: 97.5 fL (ref 80.0–100.0)
Platelets: 148 10*3/uL — ABNORMAL LOW (ref 150–400)
RBC: 4.36 MIL/uL (ref 4.22–5.81)
RDW: 13.4 % (ref 11.5–15.5)
WBC: 9.7 10*3/uL (ref 4.0–10.5)
nRBC: 0 % (ref 0.0–0.2)

## 2018-11-09 NOTE — Care Management Important Message (Signed)
Important Message  Patient Details  Name: [name] MRN: [id number] Date of Birth: 1919/04/01   Medicare Important Message Given:  Yes    Kerin Salen 11/09/2018, 1:00 [place] Message  Patient Details  Name: [name] MRN: [id number] Date of Birth: September 21, 1919   Medicare Important Message Given:  Yes    Kerin Salen 11/09/2018, 1:00 PM

## 2018-11-09 NOTE — Progress Notes (Signed)
1 Day Post-Op    CC:  chest discomfort,abdominal discomfort, anxious, urinary frequency, disorientation, fever and chills  Subjective: [name] was confused last PM and knows it.  It seems to worry him.  [name] is better this AM , [name] says his stomach hurts when [name] coughs but that seems to be the only time [name] has noticed it so far.  [name] has tolerated clears so far but says [name] does not believe [name] is ready for more food.  No I-S in the room so that is not been done yet.  Port sites all look good.  Objective: Vital signs in last 24 hours: Temp:  [97.3 F (36.3 C)-98.4 F (36.9 C)] 98.4 F (36.9 C) (12/13 0609) Pulse Rate:  [73-86] 75 (12/13 0609) Resp:  [15-24] 17 (12/13 0609) BP: (129-167)/(76-101) 129/82 (12/13 0609) SpO2:  [92 %-96 %] 96 % (12/13 0609) Last BM Date: 11/07/18 540 PO 1381 IV 450 urine recorded  Afebrile, VSS LFT's improving, Creatinine stable  WBC 9.7 H/H stable IOC:  Contrast fills the biliary tree and duodenum without filling defects in the common bile duct.  Intake/Output from previous day: 12/12 0701 - 12/13 0700 In: 2026.3 [P.O.:540; I.V.:1381.3; IV Piggyback:105] Out: 500 [Urine:450; Blood:50] Intake/Output this shift: Total I/O In: 240 [P.O.:240] Out: -   General appearance: alert, cooperative and no distress Resp: clear to auscultation bilaterally and anterior exam GI: soft, sore, sites OK with some ecchymosis umbilical port  Lab Results:  Recent Labs    11/08/18 0405 11/09/18 0424  WBC 4.4 9.7  HGB 11.8* 13.7  HCT 36.8* 42.5  PLT 120* 148*    BMET Recent Labs    11/08/18 0405 11/09/18 0424  NA 141 138  K 4.1 4.7  CL 110 106  CO2 23 20*  GLUCOSE 86 188*  BUN 16 17  CREATININE 1.30* 1.28*  CALCIUM 8.7* 9.0   PT/INR Recent Labs    11/06/18 1855  LABPROT 13.3  INR 1.02    Recent Labs  Lab 11/06/18 1855 11/07/18 0332 11/08/18 0405 11/09/18 0424  AST 744* 538* 218* 139*  ALT 421* 420* 247* 199*  ALKPHOS 103 86 71 70  BILITOT  2.0* 3.4* 2.8* 1.6*  PROT 7.4 6.7 6.0* 6.3*  ALBUMIN 4.3 3.7 3.3* 3.5     Lipase     Component Value Date/Time   LIPASE 27 11/07/2018 0332     Medications: . acetaminophen  1,000 mg Oral Q8H  . amLODipine  5 mg Oral Daily  . aspirin EC  81 mg Oral Daily  . [START ON 11/10/2018] enalapril  20 mg Oral Daily  . gabapentin  200 mg Oral q morning - 10a  . gabapentin  300 mg Oral QHS  . levothyroxine  112 mcg Oral QAC breakfast  . pantoprazole  40 mg Oral Q1200  . vitamin B-12  1,000 mcg Oral Daily   . sodium chloride 75 mL/hr at 11/09/18 0200   Anti-infectives (From admission, onward)   Start     Dose/Rate Route Frequency Ordered Stop   11/08/18 2200  piperacillin-tazobactam (ZOSYN) IVPB 3.375 g     3.375 g 12.5 mL/hr over 240 Minutes Intravenous Every 8 hours 11/08/18 1715 11/09/18 0230   11/07/18 0500  piperacillin-tazobactam (ZOSYN) IVPB 3.375 g  Status:  Discontinued     3.375 g 12.5 mL/hr over 240 Minutes Intravenous Every 8 hours 11/07/18 0259 11/08/18 1715   11/06/18 2000  vancomycin (VANCOCIN) 2,000 mg in sodium chloride 0.9 % 500 mL IVPB  2,000 mg 250 mL/hr over 120 Minutes Intravenous  Once 11/06/18 1942 11/07/18 0211   11/06/18 1945  ceFEPIme (MAXIPIME) 2 g in sodium chloride 0.9 % 100 mL IVPB     2 g 200 mL/hr over 30 Minutes Intravenous  Once 11/06/18 1939 11/06/18 2127   11/06/18 1945  metroNIDAZOLE (FLAGYL) IVPB 500 mg  Status:  Discontinued     500 mg 100 mL/hr over 60 Minutes Intravenous Every 8 hours 11/06/18 1939 11/07/18 0233   11/06/18 1945  vancomycin (VANCOCIN) IVPB 1000 mg/200 mL premix  Status:  Discontinued     1,000 mg 200 mL/hr over 60 Minutes Intravenous  Once 11/06/18 1939 11/06/18 1942     Assessment/Plan  Hx non-Hodgkin's lymphoma left neck with prior excisional left neck biopsy Hx malaria-[place] World War [employer] Hypertension Venous stasis disease-bilateral Hx peripheral neuropathy Hypothyroid on supplement Hx  GERD Thrombocytopenia DNR CKD /dehydration - mild  - creatinine up  1.2 >>1.12 >> 1.3 today  Abdominal pain with fever/sepsis Cholelithiasis with gallbladder wall thickening CBD 3 mm, gallbladder wall edema on CT, elevated LFTs. Laparoscopic cholecystectomy with IOC 11/08/18, Dr. [name]  FEN:  IV fluids@ 75 cc/hr - clears =>> advance as tolerated ID: Vancomycin/Flagyl/Maxipime x 12/10;  Zosyn 12/11 =>> day 2 DVT:  SCD's, Heparin x 1 dose last PM, on hold till surgery Follow up:  [facility]  Plan:  [name] has PT/OT evals ordered, activity orders, advance diet orders.  From our standpoint [name] can go when [name] is voiding well, labs stable, tolerating diet and pain controlled by PO pain medicines.  I will have follow up and DC instructions in the AVS.  [name] can shower/wash sites with soap and water. Would just give him Tylenol/Tramadol for pain.    LOS: 2 days    [name] 11/09/2018 [phone]

## 2018-11-09 NOTE — Discharge Instructions (Signed)
[facility] ______[facility] °LAPAROSCOPIC SURGERY: POST OP INSTRUCTIONS °Always review your discharge instruction sheet given to you by the facility where your surgery was performed. °IF YOU HAVE DISABILITY OR FAMILY LEAVE FORMS, YOU MUST BRING THEM TO THE OFFICE FOR PROCESSING.   °DO NOT GIVE THEM TO YOUR DOCTOR. ° °1. A prescription for pain medication may be given to you upon discharge.  Take your pain medication as prescribed, if needed.  If narcotic pain medicine is not needed, then you may take acetaminophen (Tylenol) or ibuprofen (Advil) as needed. °2. Take your usually prescribed medications unless otherwise directed. °3. If you need a refill on your pain medication, please contact your pharmacy.  They will contact our office to request authorization. Prescriptions will not be filled after 5pm or on week-ends. °4. You should follow a light diet the first few days after arrival home, such as soup and crackers, etc.  Be sure to include lots of fluids daily. °5. Most patients will experience some swelling and bruising in the area of the incisions.  Ice packs will help.  Swelling and bruising can take several days to resolve.  °6. It is common to experience some constipation if taking pain medication after surgery.  Increasing fluid intake and taking a stool softener (such as Colace) will usually help or prevent this problem from occurring.  A mild laxative (Milk of Magnesia or Miralax) should be taken according to package instructions if there are no bowel movements after 48 hours. °7. Unless discharge instructions indicate otherwise, you may remove your bandages 24-48 hours after surgery, and you may shower at that time.  You may have steri-strips (small skin tapes) in place directly over the incision.  These strips should be left on the skin for 7-10 days.  If your surgeon used skin glue on the incision, you may shower in 24 hours.  The glue will flake off over the next 2-3 weeks.  Any sutures or  staples will be removed at the office during your follow-up visit. °8. ACTIVITIES:  You may resume regular (light) daily activities beginning the next day--such as daily self-care, walking, climbing stairs--gradually increasing activities as tolerated.  You may have sexual intercourse when it is comfortable.  Refrain from any heavy lifting or straining until approved by your doctor. °a. You may drive when you are no longer taking prescription pain medication, you can comfortably wear a seatbelt, and you can safely maneuver your car and apply brakes. °b. RETURN TO WORK:  __________________________________________________________ °9. You should see your doctor in the office for a follow-up appointment approximately 2-3 weeks after your surgery.  Make sure that you call for this appointment within a day or two after you arrive home to insure a convenient appointment time. °10. OTHER INSTRUCTIONS: __________________________________________________________________________________________________________________________ __________________________________________________________________________________________________________________________ °WHEN TO CALL YOUR DOCTOR: °1. Fever over 101.0 °2. Inability to urinate °3. Continued bleeding from incision. °4. Increased pain, redness, or drainage from the incision. °5. Increasing abdominal pain ° °The clinic staff is available to answer your questions during regular business hours.  Please don’t hesitate to call and ask to speak to one of the nurses for clinical concerns.  If you have a medical emergency, go to the nearest emergency room or call 911.  A surgeon from [facility] is always on call at the hospital. °[address], [address], [place], [place]  [address] ? [address] [address], [place],    [address] °[phone] ? [phone] ? FAX [phone] °Web site:   [email or web address] ° ° °Laparoscopic Cholecystectomy, Care After °This sheet  gives you information about how to care for yourself after your procedure. Your health care provider may also give you more specific instructions. If you have problems or questions, contact your health care provider. °What can I expect after the procedure? °After the procedure, it is common to have: °· Pain at your incision sites. You will be given medicines to control this pain. °· Mild nausea or vomiting. °· Bloating and possible shoulder pain from the air-like gas that was used during the procedure. ° °Follow these instructions at home: °Incision care ° °· Follow instructions from your health care provider about how to take care of your incisions. Make sure you: °? Wash your hands with soap and water before you change your bandage (dressing). If soap and water are not available, use hand sanitizer. °? Change your dressing as told by your health care provider. °? Leave stitches (sutures), skin glue, or adhesive strips in place. These skin closures may need to be in place for 2 weeks or longer. If adhesive strip edges start to loosen and curl up, you may trim the loose edges. Do not remove adhesive strips completely unless your health care provider tells you to do that. °· Do not take baths, swim, or use a hot tub until your health care provider approves. Ask your health care provider if you can take showers. You may only be allowed to take sponge baths for bathing. °· Check your incision area every day for signs of infection. Check for: °? More redness, swelling, or pain. °? More fluid or blood. °? Warmth. °? Pus or a bad smell. °Activity °· Do not drive or use heavy machinery while taking prescription pain medicine. °· Do not lift anything that is heavier than 10 lb (4.5 kg) until your health care provider approves. °· Do not play contact sports until your health care provider approves. °· Do not drive for 24 hours if you were given a medicine to help you relax (sedative). °· Rest as needed. Do not return to work  or school until your health care provider approves. °General instructions °· Take over-the-counter and prescription medicines only as told by your health care provider. °· To prevent or treat constipation while you are taking prescription pain medicine, your health care provider may recommend that you: °? Drink enough fluid to keep your urine clear or pale yellow. °? Take over-the-counter or prescription medicines. °? Eat foods that are high in fiber, such as fresh fruits and vegetables, whole grains, and beans. °? Limit foods that are high in fat and processed sugars, such as fried and sweet foods. °Contact a health care provider if: °· You develop a rash. °· You have more redness, swelling, or pain around your incisions. °· You have more fluid or blood coming from your incisions. °· Your incisions feel warm to the touch. °· You have pus or a bad smell coming from your incisions. °· You have a fever. °· One or more of your incisions breaks open. °Get help right away if: °· You have trouble breathing. °· You have chest pain. °· You have increasing pain in your shoulders. °· You faint or feel dizzy when you stand. °· You have severe pain in your abdomen. °· You have nausea or vomiting that lasts for more than one day. °· You have leg pain. °This information is not intended to replace advice given to you by your health care provider. Make sure you   discuss any questions you have with your health care provider. Document Released: 11/14/2005 Document Revised: 06/04/2016 Document Reviewed: 05/02/2016 Elsevier Interactive Patient Education  2018 [employer].

## 2018-11-09 NOTE — Progress Notes (Signed)
PROGRESS NOTE    STYLES FAMBRO  XBD:[id number] DOB: Apr 19, 1919 DOA: 11/06/2018 PCP: Susy Frizzle, MD   Brief Narrative:  84 year old with past medical history relevant for non-Hodgkin's lymphoma in remission, hypertension, hypothyroidism, chronic pain, reflux, stage II CKD admitted with fever and vague reports of abdominal pain and found to have possible acute cholecystitis on CT and right upper quadrant ultrasound status post cholecystectomy on 11/08/2018.   Assessment & Plan:   Principal Problem:   Cholecystitis, acute Active Problems:   Acid reflux   Hypertension   Gallstone   Hypothyroidism   CKD (chronic kidney disease) stage 3, GFR 30-59 ml/min (HCC)   Acute cholecystitis   Abnormal liver function   NHL (non-Hodgkin's lymphoma) ([place])   #) Acute cholecystitis status post cholecystectomy 11/08/2018: Patient is doing well postoperatively.  He did have a period of confusion last night after the surgery but is back to his baseline. -Clear liquids, advance diet as tolerated -General surgery consult, appreciate recommendations, they recommend discharge as soon as he can tolerate p.o.  #) Hypertension: -Continue amlodipine 5 mg daily -Continue aspirin 81 mg -Continue enalapril 20 mg daily  #) Chronic venous stasis dermatitis: -Hold furosemide 40 mg daily  #) Hypothyroidism: -Continue levothyroxine 112 mcg daily  #) Pain/psych: -Continue gabapentin 200 mg every morning and 300 mg nightly  #) Stage II CKD: -Stable  Fluids: IV fluids discontinued today Electrolytes: Monitor and supplement Nutrition: Clear liquid diet advance as tolerated  Prophylaxis: SCDs  Disposition: Pending tolerating diet and PT/OT evaluation  DNR   Consultants:   General surgery  Procedures:   11/07/2018 HIDA scan: No evidence of acute cholecystitis, no choledocholithiasis  Antimicrobials:   IV Zosyn started 11/07/2018 to 11/09/2018   Subjective: This morning the patient  reports he is feeling somewhat poorly.  He reports that he is tolerating clears but he does not want to eat much.  He denies any nausea, vomiting, diarrhea, cough, congestion, rhinorrhea.  Objective: Vitals:   11/08/18 1711 11/08/18 1849 11/08/18 2257 11/09/18 0609  BP: (!) 146/93 (!) 155/82 139/80 129/82  Pulse: 80 80 81 75  Resp: 18 16 16 17   Temp: (!) 97.3 F (36.3 C)  (!) 97.5 F (36.4 C) 98.4 F (36.9 C)  TempSrc: Oral  Oral Oral  SpO2: 95%  96% 96%  Weight:      Height:        Intake/Output Summary (Last 24 hours) at 11/09/2018 0940 Last data filed at 11/09/2018 0902 Gross per 24 hour  Intake 2266.25 ml  Output 525 ml  Net 1741.25 ml   Filed Weights   11/06/18 1856 11/07/18 0431  Weight: 90.7 kg 97.9 kg    Examination:  General exam: Appears calm and comfortable  Respiratory system: Clear to auscultation. Respiratory effort normal. Cardiovascular system: Regular rate and rhythm, no murmurs Gastrointestinal system: Soft, nondistended, hypoactive bowel sounds, no rebound or guarding Central nervous system: Alert and oriented.  Grossly intact, moving all extremities Extremities: Trace lower extremity edema. Skin: Chronic venous stasis dermatitis, incision sites look clean dry and intact Psychiatry: Judgement and insight appear normal. Mood & affect appropriate.     Data Reviewed: I have personally reviewed following labs and imaging studies  CBC: Recent Labs  Lab 11/06/18 1855 11/07/18 0332 11/08/18 0405 11/09/18 0424  WBC 4.3 5.6 4.4 9.7  NEUTROABS 3.4  --   --   --   HGB 14.4 13.1 11.8* 13.7  HCT 45.4 40.5 36.8* 42.5  MCV 96.6 97.4  98.1 97.5  PLT 134* 135* 120* 157*   Basic Metabolic Panel: Recent Labs  Lab 11/06/18 1855 11/07/18 0332 11/08/18 0405 11/09/18 0424  NA 142 143 141 138  K 4.3 4.3 4.1 4.7  CL 109 111 110 106  CO2 21* 25 23 20*  GLUCOSE 144* 112* 86 188*  BUN 22 19 16 17   CREATININE 1.20 1.12 1.30* 1.28*  CALCIUM 9.0 9.1 8.7*  9.0  MG  --   --  1.8  --    GFR: Estimated Creatinine Clearance: 38.4 mL/min (A) (by C-G formula based on SCr of 1.28 mg/dL (H)). Liver Function Tests: Recent Labs  Lab 11/06/18 1855 11/07/18 0332 11/08/18 0405 11/09/18 0424  AST 744* 538* 218* 139*  ALT 421* 420* 247* 199*  ALKPHOS 103 86 71 70  BILITOT 2.0* 3.4* 2.8* 1.6*  PROT 7.4 6.7 6.0* 6.3*  ALBUMIN 4.3 3.7 3.3* 3.5   Recent Labs  Lab 11/06/18 1855 11/07/18 0332  LIPASE 30 27   No results for input(s): AMMONIA in the last 168 hours. Coagulation Profile: Recent Labs  Lab 11/06/18 1855  INR 1.02   Cardiac Enzymes: No results for input(s): CKTOTAL, CKMB, CKMBINDEX, TROPONINI in the last 168 hours. BNP (last 3 results) No results for input(s): PROBNP in the last 8760 hours. HbA1C: No results for input(s): HGBA1C in the last 72 hours. CBG: No results for input(s): GLUCAP in the last 168 hours. Lipid Profile: No results for input(s): CHOL, HDL, LDLCALC, TRIG, CHOLHDL, LDLDIRECT in the last 72 hours. Thyroid Function Tests: No results for input(s): TSH, T4TOTAL, FREET4, T3FREE, THYROIDAB in the last 72 hours. Anemia Panel: No results for input(s): VITAMINB12, FOLATE, FERRITIN, TIBC, IRON, RETICCTPCT in the last 72 hours. Sepsis Labs: Recent Labs  Lab 11/06/18 1856  LATICACIDVEN 0.81    Recent Results (from the past 240 hour(s))  Culture, blood (Routine x 2)     Status: None (Preliminary result)   Collection Time: 11/06/18  6:55 PM  Result Value Ref Range Status   Specimen Description   Final    BLOOD LEFT ARM Performed at [facility], [place] [address], [place], [place] [address]    Special Requests   Final    BOTTLES DRAWN AEROBIC AND ANAEROBIC Blood Culture adequate volume Performed at [facility] [address] [place], [place] [address]    Culture   Final    NO GROWTH 2 DAYS Performed at [facility] [address], [place], [place] [address]     Report Status PENDING  Incomplete  Culture, blood (Routine x 2)     Status: None (Preliminary result)   Collection Time: 11/06/18  7:05 PM  Result Value Ref Range Status   Specimen Description   Final    BLOOD LEFT ANTECUBITAL Performed at [facility] [address], [place], [place] [address]    Special Requests   Final    BOTTLES DRAWN AEROBIC AND ANAEROBIC Blood Culture results may not be optimal due to an excessive volume of blood received in culture bottles Performed at [facility] [address], [place], [place] [address]    Culture   Final    NO GROWTH 2 DAYS Performed at [facility] [address] [place],  [address]    Report Status PENDING  Incomplete         Radiology Studies: Dg Cholangiogram Operative  Result Date: 11/08/2018 CLINICAL DATA:  Cholelithiasis EXAM: INTRAOPERATIVE  CHOLANGIOGRAM TECHNIQUE: Cholangiographic images from the C-arm fluoroscopic device were submitted for interpretation post-operatively. Please see the procedural report for the amount of contrast and the fluoroscopy time utilized. COMPARISON:  None. FINDINGS: Contrast fills the biliary tree and duodenum without filling defects in the common bile duct. IMPRESSION: Patent biliary tree. Electronically Signed   By: Marybelle Killings M.D.   On: 11/08/2018 15:16   Nm Hepatobiliary Liver Func  Result Date: 11/07/2018 CLINICAL DATA:  RIGHT upper quadrant pain, fever, leukocytosis. Cholelithiasis and gallbladder wall thickening by ultrasound question acute cholecystitis, mildly thickened gallbladder wall by CT; bilirubin 3.4 EXAM: NUCLEAR MEDICINE HEPATOBILIARY IMAGING TECHNIQUE: Sequential images of the abdomen were obtained out to 60 minutes following intravenous administration of radiopharmaceutical. RADIOPHARMACEUTICALS:  7.5 mCi Tc-81m  Choletec IV COMPARISON:  Ultrasound abdomen 11/06/2018, CT abdomen and pelvis 11/06/2018 FINDINGS: Delayed  clearance of tracer from bloodstream indicating hepatocellular dysfunction. Delayed excretion of tracer into biliary tree. Gallbladder did not visualized during the first hour of imaging. Small bowel visualized at approximately 100 minutes. Delayed imaging of the gallbladder over an additional lower also fails to demonstrate tracer within the gallbladder. Significant tracer retention is seen within the liver parenchyma at the conclusion of 2 hours of imaging. IMPRESSION: Nonvisualization of the gallbladder over 2 hours consistent with acute cholecystitis. Patent CBD. Prolonged retention of tracer within hepatic parenchyma consistent with hepatocellular dysfunction. Electronically Signed   By: Lavonia Dana M.D.   On: 11/07/2018 15:02        Scheduled Meds: . acetaminophen  1,000 mg Oral Q8H  . amLODipine  5 mg Oral Daily  . aspirin EC  81 mg Oral Daily  . [START ON 11/10/2018] enalapril  20 mg Oral Daily  . gabapentin  200 mg Oral q morning - 10a  . gabapentin  300 mg Oral QHS  . levothyroxine  112 mcg Oral QAC breakfast  . pantoprazole  40 mg Oral Q1200  . vitamin B-12  1,000 mcg Oral Daily   Continuous Infusions: . sodium chloride 75 mL/hr at 11/09/18 0200     LOS: 2 days    Time spent: [place], MD [facility]  If 7PM-7AM, please contact night-coverage [email or web address] Password [email or web address] 11/09/2018, 9:40 AM

## 2018-11-09 NOTE — Plan of Care (Signed)

## 2018-11-09 NOTE — Evaluation (Signed)
Physical Therapy Evaluation Patient Details Name: [name] MRN: [id number] DOB: 01-17-1919 Today's Date: 11/09/2018   History of Present Illness  s/p lap chole.  PMH:  PVD, HTN, NHL in remission, amputation L great toe  Clinical Impression  The patient demonstrates decreased balance, requiring multiple assists for balance losses during ambulation. Patient himself reports that his balance is worse than PTA. No family present. Patient agreeable to rehab after Dc. Pt admitted with above diagnosis. Pt currently with functional limitations due to the deficits listed below (see PT Problem List).  Pt will benefit from skilled PT to increase their independence and safety with mobility to allow discharge to the venue listed below.       Follow Up Recommendations SNF    Equipment Recommendations  None recommended by PT    Recommendations for Other Services       Precautions / Restrictions Precautions Precautions: Fall Restrictions Weight Bearing Restrictions: No      Mobility  Bed Mobility               General bed mobility comments: oob  Transfers Overall transfer level: Needs assistance Equipment used: Rolling walker (2 wheeled) Transfers: Sit to/from Stand Sit to Stand: Mod assist         General transfer comment: assist to rise and stabilize. Pt with posterior bias  Ambulation/Gait Ambulation/Gait assistance: Mod assist;Max assist;+2 safety/equipment Gait Distance (Feet): 60 Feet Assistive device: Rolling walker (2 wheeled) Gait Pattern/deviations: Step-through pattern;Step-to pattern;Drifts right/left;Staggering right;Staggering left Gait velocity: decr   General Gait Details: tends to place the right leg out from RW. Required mod/max assist  for balance loss multiple times while ambulating.  Stairs            Wheelchair Mobility    Modified Rankin (Stroke Patients Only)       Balance Overall balance assessment: History of Falls;Needs  assistance Sitting-balance support: Bilateral upper extremity supported;Feet supported Sitting balance-Leahy Scale: Good     Standing balance support: Bilateral upper extremity supported;During functional activity Standing balance-Leahy Scale: Poor Standing balance comment: loses balance posteriorly                             Pertinent Vitals/Pain Pain Assessment: Faces Faces Pain Scale: Hurts a little bit Pain Location: abdomen/incisions Pain Descriptors / Indicators: Sore Pain Intervention(s): Monitored during session    Home Living Family/patient expects to be discharged to:: Private residence Living Arrangements: Spouse/significant other Available Help at Discharge: Friend(s) Type of Home: House Home Access: Stairs to enter Entrance Stairs-Rails: Right Entrance Stairs-Number of Steps: 6   Home Equipment: Walker - 2 wheels;Cane - single point;Toilet riser;Shower seat Additional Comments: 4 children take turns staying at night.  Pt's wife has dementia    Prior Function Level of Independence: Independent with assistive device(s)         Comments: does not drive     Hand Dominance        Extremity/Trunk Assessment   Upper Extremity Assessment Upper Extremity Assessment: Defer to OT evaluation    Lower Extremity Assessment Lower Extremity Assessment: Generalized weakness    Cervical / Trunk Assessment Cervical / Trunk Assessment: Kyphotic  Communication   Communication: HOH  Cognition Arousal/Alertness: Awake/alert Behavior During Therapy: WFL for tasks assessed/performed Overall Cognitive Status: Within Functional Limits for tasks assessed  General Comments General comments (skin integrity, edema, etc.): this was pt's first time up walking after sx. He was up in chair    Exercises     Assessment/Plan    PT Assessment Patient needs continued PT services  PT Problem List Decreased  strength;Decreased activity tolerance;Decreased balance;Decreased mobility;Decreased knowledge of precautions;Decreased safety awareness;Decreased knowledge of use of DME;Pain       PT Treatment Interventions DME instruction;Therapeutic exercise;Gait training;Functional mobility training;Therapeutic activities;Patient/family education    PT Goals (Current goals can be found in the Care Plan section)  Acute Rehab PT Goals Patient Stated Goal: to get my baLANCE BACK PT Goal Formulation: With patient Time For Goal Achievement: 11/23/18 Potential to Achieve Goals: Good    Frequency Min 2X/week   Barriers to discharge Decreased caregiver support      Co-evaluation               AM-PAC PT "6 Clicks" Mobility  Outcome Measure Help needed turning from your back to your side while in a flat bed without using bedrails?: Total Help needed moving from lying on your back to sitting on the side of a flat bed without using bedrails?: Total Help needed moving to and from a bed to a chair (including a wheelchair)?: Total Help needed standing up from a chair using your arms (e.g., wheelchair or bedside chair)?: Total Help needed to walk in hospital room?: Total Help needed climbing 3-5 steps with a railing? : Total 6 Click Score: 6    End of Session Equipment Utilized During Treatment: Gait belt Activity Tolerance: Patient tolerated treatment well Patient left: in chair;with call bell/phone within reach;with chair alarm set Nurse Communication: Mobility status PT Visit Diagnosis: Unsteadiness on feet (R26.81);Pain    Time: 1225-1253 PT Time Calculation (min) (ACUTE ONLY): 28 min   Charges:   PT Evaluation $PT Eval Low Complexity: 1 Low PT Treatments $Gait Training: 8-22 mins        Tresa Endo PT Acute Rehabilitation Services Pager [phone] Office [phone]   Claretha Cooper 11/09/2018, 1:10 PM

## 2018-11-09 NOTE — Evaluation (Signed)
Occupational Therapy Evaluation Patient Details Name: [name] MRN: [id number] DOB: [birth year] Today's Date: 11/09/2018    History of Present Illness s/p lap chole.  PMH:  PVD, HTN, NHL in remission, amputation L great toe   Clinical Impression   This 84 year old man was admitted for the above.  Pt is mod I at home at baseline. Children stay at night, mostly for his wife who has dementia.  Pt has had several LOB posteriorly and required mod A to recover/avoid fall. Recommend continued OT at SNF to regain strength for safe d/c home    Follow Up Recommendations  SNF    Equipment Recommendations  None recommended by OT    Recommendations for Other Services       Precautions / Restrictions Precautions Precautions: Fall Restrictions Weight Bearing Restrictions: No      Mobility Bed Mobility               General bed mobility comments: oob  Transfers Overall transfer level: Needs assistance Equipment used: Rolling walker (2 wheeled) Transfers: Sit to/from Stand Sit to Stand: Mod assist         General transfer comment: assist to rise and stabilize. Pt with posterior bias    Balance Overall balance assessment: Needs assistance           Standing balance-Leahy Scale: Poor Standing balance comment: loses balance posteriorly                           ADL either performed or assessed with clinical judgement   ADL Overall ADL's : Needs assistance/impaired Eating/Feeding: Independent   Grooming: Set up;Sitting   Upper Body Bathing: Set up;Sitting   Lower Body Bathing: Moderate assistance;Sit to/from stand   Upper Body Dressing : Set up;Sitting   Lower Body Dressing: Maximal assistance;Sit to/from stand   Toilet Transfer: Moderate assistance;Ambulation;BSC;RW Toilet Transfer Details (indicate cue type and reason): pt with several posterior LOB; +2 safety recommended Toileting- Clothing Manipulation and Hygiene: Moderate assistance;Sit  to/from stand         General ADL Comments: ambulated to bathroom; son present in room     Vision   Additional Comments: decreased vision at baseline     Perception     Praxis      Pertinent Vitals/Pain Pain Assessment: Faces Faces Pain Scale: Hurts a little bit Pain Location: abdomen/incisions Pain Descriptors / Indicators: Sore Pain Intervention(s): Limited activity within patient's tolerance;Monitored during session;Premedicated before session;Repositioned     Hand Dominance     Extremity/Trunk Assessment Upper Extremity Assessment Upper Extremity Assessment: Overall WFL for tasks assessed           Communication Communication Communication: HOH   Cognition Arousal/Alertness: Awake/alert Behavior During Therapy: WFL for tasks assessed/performed Overall Cognitive Status: Within Functional Limits for tasks assessed(very HOH)                                     General Comments  this was pt's first time up walking after sx. He was up in chair    Exercises     Shoulder Instructions      Why expects to be discharged to:: Private residence Living Arrangements: Spouse/significant other Available Help at Discharge: Friend(s)               Bathroom Shower/Tub: Occupational psychologist: Handicapped  height     Home Equipment: Walker - 2 wheels;Cane - single point;Toilet riser;Shower seat   Additional Comments: 4 children take turns staying at night.  Pt's wife has dementia      Prior Functioning/Environment Level of Independence: Independent with assistive device(s)                 OT Problem List: Decreased strength;Decreased activity tolerance;Impaired balance (sitting and/or standing);Decreased knowledge of use of DME or AE;Pain      OT Treatment/Interventions: Self-care/ADL training;DME and/or AE instruction;Balance training;Patient/family education;Therapeutic activities;Energy conservation     OT Goals(Current goals can be found in the care plan section) Acute Rehab OT Goals Patient Stated Goal: stay sitting up in chair; walk again OT Goal Formulation: With patient Time For Goal Achievement: 11/23/18 Potential to Achieve Goals: Good  OT Frequency: Min 2X/week   Barriers to D/C:            Co-evaluation              AM-PAC OT "6 Clicks" Daily Activity     Outcome Measure Help from another person eating meals?: None Help from another person taking care of personal grooming?: A Little Help from another person toileting, which includes using toliet, bedpan, or urinal?: A Lot Help from another person bathing (including washing, rinsing, drying)?: A Lot Help from another person to put on and taking off regular upper body clothing?: A Lot Help from another person to put on and taking off regular lower body clothing?: A Lot 6 Click Score: 15   End of Session    Activity Tolerance: Patient tolerated treatment well Patient left: in chair;with call bell/phone within reach;with family/visitor present  OT Visit Diagnosis: Unsteadiness on feet (R26.81)                Time: 2035-5974 OT Time Calculation (min): 21 min Charges:  OT General Charges $OT Visit: 1 Visit OT Evaluation $OT Eval Low Complexity: [place], OTR/L Acute Rehabilitation Services [phone] [facility] pager [phone] office 11/09/2018  [name] 11/09/2018, 11:23 AM

## 2018-11-10 LAB — CBC
HCT: 40.6 % (ref 39.0–52.0)
Hemoglobin: 13.1 g/dL (ref 13.0–17.0)
MCH: 31.3 pg (ref 26.0–34.0)
MCHC: 32.3 g/dL (ref 30.0–36.0)
MCV: 96.9 fL (ref 80.0–100.0)
Platelets: 142 10*3/uL — ABNORMAL LOW (ref 150–400)
RBC: 4.19 MIL/uL — ABNORMAL LOW (ref 4.22–5.81)
RDW: 13.6 % (ref 11.5–15.5)
WBC: 8.4 10*3/uL (ref 4.0–10.5)
nRBC: 0 % (ref 0.0–0.2)

## 2018-11-10 LAB — COMPREHENSIVE METABOLIC PANEL
ALT: 138 U/L — ABNORMAL HIGH (ref 0–44)
AST: 73 U/L — ABNORMAL HIGH (ref 15–41)
Albumin: 3.6 g/dL (ref 3.5–5.0)
Alkaline Phosphatase: 60 U/L (ref 38–126)
Anion gap: 8 (ref 5–15)
BUN: 20 mg/dL (ref 8–23)
CO2: 27 mmol/L (ref 22–32)
Calcium: 8.9 mg/dL (ref 8.9–10.3)
Chloride: 105 mmol/L (ref 98–111)
Creatinine, Ser: 1.36 mg/dL — ABNORMAL HIGH (ref 0.61–1.24)
GFR calc Af Amer: 50 mL/min — ABNORMAL LOW (ref >60)
GFR calc non Af Amer: 43 mL/min — ABNORMAL LOW (ref >60)
Glucose, Bld: 109 mg/dL — ABNORMAL HIGH (ref 70–99)
Potassium: 4.4 mmol/L (ref 3.5–5.1)
Sodium: 140 mmol/L (ref 135–145)
Total Bilirubin: 1.1 mg/dL (ref 0.3–1.2)
Total Protein: 6.6 g/dL (ref 6.5–8.1)

## 2018-11-10 MED ORDER — FUROSEMIDE 40 MG PO TABS
40.0000 mg | ORAL_TABLET | Freq: Every day | ORAL | Status: DC
  Administered 2018-11-10 – 2018-11-13 (×4): 40 mg via ORAL
  Filled 2018-11-10 (×4): qty 1

## 2018-11-10 NOTE — Progress Notes (Signed)
Patient ID: [name], male   DOB: May 23, 1919, 84 y.o.   MRN: [id number] 2 Days Post-Op   Subjective: Feels better today.  Confusion has cleared.  Has been working with PT but the legs are not steady.  Denies abdominal pain or nausea and ate a little better.  Objective: Vital signs in last 24 hours: Temp:  [97.3 F (36.3 C)-98.3 F (36.8 C)] 98.3 F (36.8 C) (12/14 0500) Pulse Rate:  [71-72] 72 (12/14 0500) Resp:  [16-18] 18 (12/14 0500) BP: (133-141)/(79-88) 135/86 (12/14 0500) SpO2:  [91 %-95 %] 92 % (12/14 0500) Last BM Date: 11/07/18  Intake/Output from previous day: 12/13 0701 - 12/14 0700 In: 990 [P.O.:990] Out: 475 [Urine:475] Intake/Output this shift: No intake/output data recorded.  General appearance: alert, cooperative and no distress GI: Nontender.  Possible mild distention. Incision/Wound: Clean and dry.  Lab Results:  Recent Labs    11/09/18 0424 11/10/18 0406  WBC 9.7 8.4  HGB 13.7 13.1  HCT 42.5 40.6  PLT 148* 142*   BMET Recent Labs    11/09/18 0424 11/10/18 0406  NA 138 140  K 4.7 4.4  CL 106 105  CO2 20* 27  GLUCOSE 188* 109*  BUN 17 20  CREATININE 1.28* 1.36*  CALCIUM 9.0 8.9     Studies/Results: Dg Cholangiogram Operative  Result Date: 11/08/2018 CLINICAL DATA:  Cholelithiasis EXAM: INTRAOPERATIVE CHOLANGIOGRAM TECHNIQUE: Cholangiographic images from the C-arm fluoroscopic device were submitted for interpretation post-operatively. Please see the procedural report for the amount of contrast and the fluoroscopy time utilized. COMPARISON:  None. FINDINGS: Contrast fills the biliary tree and duodenum without filling defects in the common bile duct. IMPRESSION: Patent biliary tree. Electronically Signed   By: Marybelle Killings M.D.   On: 11/08/2018 15:16    Anti-infectives: Anti-infectives (From admission, onward)   Start     Dose/Rate Route Frequency Ordered Stop   11/08/18 2200  piperacillin-tazobactam (ZOSYN) IVPB 3.375 g     3.375  g 12.5 mL/hr over 240 Minutes Intravenous Every 8 hours 11/08/18 1715 11/09/18 1113   11/07/18 0500  piperacillin-tazobactam (ZOSYN) IVPB 3.375 g  Status:  Discontinued     3.375 g 12.5 mL/hr over 240 Minutes Intravenous Every 8 hours 11/07/18 0259 11/08/18 1715   11/06/18 2000  vancomycin (VANCOCIN) 2,000 mg in sodium chloride 0.9 % 500 mL IVPB     2,000 mg 250 mL/hr over 120 Minutes Intravenous  Once 11/06/18 1942 11/07/18 0211   11/06/18 1945  ceFEPIme (MAXIPIME) 2 g in sodium chloride 0.9 % 100 mL IVPB     2 g 200 mL/hr over 30 Minutes Intravenous  Once 11/06/18 1939 11/06/18 2127   11/06/18 1945  metroNIDAZOLE (FLAGYL) IVPB 500 mg  Status:  Discontinued     500 mg 100 mL/hr over 60 Minutes Intravenous Every 8 hours 11/06/18 1939 11/07/18 0233   11/06/18 1945  vancomycin (VANCOCIN) IVPB 1000 mg/200 mL premix  Status:  Discontinued     1,000 mg 200 mL/hr over 60 Minutes Intravenous  Once 11/06/18 1939 11/06/18 1942      Assessment/Plan: s/p Procedure(s): LAPAROSCOPIC CHOLECYSTECTOMY WITH INTRAOPERATIVE CHOLANGIOGRAM Doing well without apparent complication PT has recommended SNF.  Discussed with patient and son.   LOS: 3 days    Edward Jolly 11/10/2018

## 2018-11-10 NOTE — Progress Notes (Signed)
PROGRESS NOTE    [name]  WUJ:[id number] DOB: 1919/11/18 DOA: 11/06/2018 PCP: Susy Frizzle, MD   Brief Narrative:  84 year old with past medical history relevant for non-Hodgkin's lymphoma in remission, hypertension, hypothyroidism, chronic pain, reflux, stage II CKD admitted with fever and vague reports of abdominal pain and found to have possible acute cholecystitis on CT and right upper quadrant ultrasound status post cholecystectomy on 11/08/2018.   Assessment & Plan:   Principal Problem:   Cholecystitis, acute Active Problems:   Acid reflux   Hypertension   Gallstone   Hypothyroidism   CKD (chronic kidney disease) stage 3, GFR 30-59 ml/min (HCC)   Acute cholecystitis   Abnormal liver function   NHL (non-Hodgkin's lymphoma) ([place])   #) Acute cholecystitis status post cholecystectomy 11/08/2018: Patient is doing well postoperatively.  -Tolerating regular diet -General surgery consult, appreciate recommendations -Pending discharge to skilled nursing facility  #) Hypertension: -Continue amlodipine 5 mg daily -Continue aspirin 81 mg -Continue enalapril 20 mg daily  #) Chronic venous stasis dermatitis: -Restart furosemide 40 mg daily  #) Hypothyroidism: -Continue levothyroxine 112 mcg daily  #) Pain/psych: -Continue gabapentin 200 mg every morning and 300 mg nightly  #) Stage II CKD: -Stable  Fluids: Tolerating p.o. Electrolytes: Monitor and supplement Nutrition: Regular diet  Prophylaxis: SCDs  Disposition: Pending discharge to skilled nursing facility  DNR   Consultants:   General surgery  Procedures:   11/07/2018 HIDA scan: No evidence of acute cholecystitis, no choledocholithiasis  Antimicrobials:   IV Zosyn started 11/07/2018 to 11/09/2018   Subjective: This morning the patient reports he is doing well.  He denies any nausea, vomiting, diarrhea, cough, congestion, rhinorrhea.  He does report some  constipation.  Objective: Vitals:   11/09/18 1358 11/09/18 2217 11/10/18 0223 11/10/18 0500  BP: 134/79 (!) 141/87 133/88 135/86  Pulse: 71 72 72 72  Resp: 16 16 18 18   Temp: 97.6 F (36.4 C) (!) 97.3 F (36.3 C) (!) 97.4 F (36.3 C) 98.3 F (36.8 C)  TempSrc: Oral Oral Oral Oral  SpO2: 95% 93% 91% 92%  Weight:      Height:        Intake/Output Summary (Last 24 hours) at 11/10/2018 1001 Last data filed at 11/10/2018 0600 Gross per 24 hour  Intake 630 ml  Output 300 ml  Net 330 ml   Filed Weights   11/06/18 1856 11/07/18 0431  Weight: 90.7 kg 97.9 kg    Examination:  General exam: Appears calm and comfortable  Respiratory system: Clear to auscultation. Respiratory effort normal. Cardiovascular system: Regular rate and rhythm, no murmurs Gastrointestinal system: Soft, nondistended, hypoactive bowel sounds, no rebound or guarding Central nervous system: Alert and oriented.  Grossly intact, moving all extremities Extremities: Trace lower extremity edema. Skin: Chronic venous stasis dermatitis, incision sites look clean dry and intact Psychiatry: Judgement and insight appear normal. Mood & affect appropriate.     Data Reviewed: I have personally reviewed following labs and imaging studies  CBC: Recent Labs  Lab 11/06/18 1855 11/07/18 0332 11/08/18 0405 11/09/18 0424 11/10/18 0406  WBC 4.3 5.6 4.4 9.7 8.4  NEUTROABS 3.4  --   --   --   --   HGB 14.4 13.1 11.8* 13.7 13.1  HCT 45.4 40.5 36.8* 42.5 40.6  MCV 96.6 97.4 98.1 97.5 96.9  PLT 134* 135* 120* 148* 956*   Basic Metabolic Panel: Recent Labs  Lab 11/06/18 1855 11/07/18 0332 11/08/18 0405 11/09/18 0424 11/10/18 0406  NA 142  143 141 138 140  K 4.3 4.3 4.1 4.7 4.4  CL 109 111 110 106 105  CO2 21* 25 23 20* 27  GLUCOSE 144* 112* 86 188* 109*  BUN 22 19 16 17 20   CREATININE 1.20 1.12 1.30* 1.28* 1.36*  CALCIUM 9.0 9.1 8.7* 9.0 8.9  MG  --   --  1.8  --   --    GFR: Estimated Creatinine  Clearance: 36.1 mL/min (A) (by C-G formula based on SCr of 1.36 mg/dL (H)). Liver Function Tests: Recent Labs  Lab 11/06/18 1855 11/07/18 0332 11/08/18 0405 11/09/18 0424 11/10/18 0406  AST 744* 538* 218* 139* 73*  ALT 421* 420* 247* 199* 138*  ALKPHOS 103 86 71 70 60  BILITOT 2.0* 3.4* 2.8* 1.6* 1.1  PROT 7.4 6.7 6.0* 6.3* 6.6  ALBUMIN 4.3 3.7 3.3* 3.5 3.6   Recent Labs  Lab 11/06/18 1855 11/07/18 0332  LIPASE 30 27   No results for input(s): AMMONIA in the last 168 hours. Coagulation Profile: Recent Labs  Lab 11/06/18 1855  INR 1.02   Cardiac Enzymes: No results for input(s): CKTOTAL, CKMB, CKMBINDEX, TROPONINI in the last 168 hours. BNP (last 3 results) No results for input(s): PROBNP in the last 8760 hours. HbA1C: No results for input(s): HGBA1C in the last 72 hours. CBG: No results for input(s): GLUCAP in the last 168 hours. Lipid Profile: No results for input(s): CHOL, HDL, LDLCALC, TRIG, CHOLHDL, LDLDIRECT in the last 72 hours. Thyroid Function Tests: No results for input(s): TSH, T4TOTAL, FREET4, T3FREE, THYROIDAB in the last 72 hours. Anemia Panel: No results for input(s): VITAMINB12, FOLATE, FERRITIN, TIBC, IRON, RETICCTPCT in the last 72 hours. Sepsis Labs: Recent Labs  Lab 11/06/18 1856  LATICACIDVEN 0.81    Recent Results (from the past 240 hour(s))  Culture, blood (Routine x 2)     Status: None (Preliminary result)   Collection Time: 11/06/18  6:55 PM  Result Value Ref Range Status   Specimen Description   Final    BLOOD LEFT ARM Performed at [facility], [place] [address] [place], [place] [address]    Special Requests   Final    BOTTLES DRAWN AEROBIC AND ANAEROBIC Blood Culture adequate volume Performed at [facility] [address] [place], [place] [address]    Culture   Final    NO GROWTH 2 DAYS Performed at [facility] [address], [place], [place] [address]    Report Status PENDING   Incomplete  Culture, blood (Routine x 2)     Status: None (Preliminary result)   Collection Time: 11/06/18  7:05 PM  Result Value Ref Range Status   Specimen Description   Final    BLOOD LEFT ANTECUBITAL Performed at [facility] [address], [place], [place] [address]    Special Requests   Final    BOTTLES DRAWN AEROBIC AND ANAEROBIC Blood Culture results may not be optimal due to an excessive volume of blood received in culture bottles Performed at [facility] [address], [place], [place] [address]    Culture   Final    NO GROWTH 2 DAYS Performed at [facility] [address] [place], [place] [address]    Report Status PENDING  Incomplete         Radiology Studies: Dg Cholangiogram Operative  Result Date: 11/08/2018 CLINICAL DATA:  Cholelithiasis EXAM: INTRAOPERATIVE CHOLANGIOGRAM TECHNIQUE: Cholangiographic images from the C-arm fluoroscopic device  were submitted for interpretation post-operatively. Please see the procedural report for the amount of contrast and the fluoroscopy time utilized. COMPARISON:  None. FINDINGS: Contrast fills the biliary tree and duodenum without filling defects in the common bile duct. IMPRESSION: Patent biliary tree. Electronically Signed   By: Marybelle Killings M.D.   On: 11/08/2018 15:16        Scheduled Meds: . acetaminophen  1,000 mg Oral Q8H  . amLODipine  5 mg Oral Daily  . aspirin EC  81 mg Oral Daily  . enalapril  20 mg Oral Daily  . gabapentin  200 mg Oral q morning - 10a  . gabapentin  300 mg Oral QHS  . levothyroxine  112 mcg Oral QAC breakfast  . pantoprazole  40 mg Oral Q1200  . vitamin B-12  1,000 mcg Oral Daily   Continuous Infusions:    LOS: 3 days    Time spent: [place], MD [facility]  If 7PM-7AM, please contact night-coverage [email or web address] Password [email or web address] 11/10/2018, 10:01 AM

## 2018-11-10 NOTE — Clinical Social Work Note (Signed)
Clinical Social Work Assessment  Patient Details  Name: [name] MRN: [id number] Date of Birth: 1919-01-19  Date of referral:  11/09/18               Reason for consult:  Facility Placement                Permission sought to share information with:  Facility Art therapist granted to share information::  Yes, Verbal Permission Granted  Name::     [name]  Agency::  SNF  Relationship::  Son  Contact Information:  [phone]  Housing/Transportation Living arrangements for the past 2 months:  Single Family Home Source of Information:  Adult Children Patient Interpreter Needed:  None Criminal Activity/Legal Involvement Pertinent to Current Situation/Hospitalization:  No - Comment as needed Significant Relationships:  Adult Children, Spouse Lives with:  Spouse Do you feel safe going back to the place where you live?  No Need for family participation in patient care:  Yes (Comment)  Care giving concerns:  Patient admitted with fever and vague reports of abdominal pain and found to have possible acute cholecystitis on CT and right upper quadrant ultrasound status post cholecystectomy on 11/08/2018. PT recommending short term rehab at SNF before returning home.    Social Worker assessment / plan:  CSW spoke with patient's son, [name], at bedside to discuss discharge plan and SNF referral process. Patient was in the bathroom during conversation.   Per son, patient lives at home with his wife who has dementia. Patient has three children who rotate staying with patient and wife. They all live in close proximity to patient. PTA, patient was ambulating with walker and able to complete his ADLs. Patient and wife receive meals on wheels for lunch and their children provide meals in the evening. Patient has not been able to ambulate as well as baseline during this hospitalization and will need short term therapy before returning home.  Patient's son agreeable to CSW  pursuing SNF placement at [facility] area facilities. He is familiar with [facility], [facility] but is open to other options.  Patient will need [facility] insurance Josem Kaufmann - CSW explained this process to son.  CSW will complete FL2 and send out referrals.   Employment status:  Retired Nurse, adult PT Recommendations:  [place] / Referral to community resources:  [place]  Patient/Family's Response to care:  Patient's son was appreciative of CSW involvement and discussion of discharge plan. His goal is for patient to return home and be able to ambulate like he was PTA.  Patient/Family's Understanding of and Emotional Response to Diagnosis, Current Treatment, and Prognosis:  Patient's son understands SNF process and [facility] insurance auth process. Explained that [facility] won't approve until Monday at the earliest, reports understanding.  Emotional Assessment Appearance:  Appears stated age Attitude/Demeanor/Rapport:  Unable to Assess Affect (typically observed):  Unable to Assess Orientation:  Oriented to Self, Oriented to Place, Oriented to  Time, Oriented to Situation Alcohol / Substance use:  Not Applicable Psych involvement (Current and /or in the community):  No (Comment)  Discharge Needs  Concerns to be addressed:  Care Coordination Readmission within the last 30 days:  No Current discharge risk:  Physical Impairment Barriers to Discharge:  Ship broker, Continued Medical Work up   [employer], [place] 11/10/2018, 4:59 PM

## 2018-11-10 NOTE — NC FL2 (Signed)
[place] LEVEL OF CARE SCREENING TOOL     IDENTIFICATION  Patient Name: [name] Birthdate: 10/07/1919 Sex: male Admission Date (Current Location): 11/06/2018  [facility] and [place] Number:  Herbalist and Address:  [facility],  [place] [address] [place]      Provider Number: [id number]  Attending Physician Name and Address:  Cristy Folks, MD  Relative Name and Phone Number:  Argelio Granier: [phone]    Current Level of Care: Hospital Recommended Level of Care: [place] Prior Approval Number:    Date Approved/Denied:   PASRR Number: [id number]  Discharge Plan: SNF    Current Diagnoses: Patient Active Problem List   Diagnosis Date Noted  . Cholecystitis, acute 11/07/2018  . Gallstone 11/07/2018  . Hypothyroidism 11/07/2018  . CKD (chronic kidney disease) stage 3, GFR 30-59 ml/min (HCC) 11/07/2018  . Acute cholecystitis 11/07/2018  . Abnormal liver function 11/07/2018  . NHL (non-Hodgkin's lymphoma) ([place]) 11/07/2018  . Cellulitis 01/18/2017  . Venous stasis dermatitis 05/28/2015  . Wound, open, toe 05/28/2015  . Insomnia 05/28/2015  . Cancer ([place]) 09/05/2013  . Lymphadenopathy, cervical 08/29/2013    Class: Chronic  . Acid reflux   . Hypertension   . Peripheral vascular disease (HCC)     Orientation RESPIRATION BLADDER Height & Weight     Self, Time, Situation, Place  Normal Continent Weight: 215 lb 13.3 oz (97.9 kg) Height:  6\' 2"  (188 cm)  BEHAVIORAL SYMPTOMS/MOOD NEUROLOGICAL BOWEL NUTRITION STATUS      Continent Diet(Regular)  AMBULATORY STATUS COMMUNICATION OF NEEDS Skin   Limited Assist Verbally Surgical wounds(Abdomen incision)                       Personal Care Assistance Level of Assistance  Bathing, Feeding, Dressing Bathing Assistance: Limited assistance Feeding assistance: Limited assistance Dressing Assistance: Limited assistance     Functional Limitations  Info  Speech, Hearing, Sight Sight Info: Adequate Hearing Info: Adequate Speech Info: Adequate    SPECIAL CARE FACTORS FREQUENCY  PT (By licensed PT), OT (By licensed OT)     PT Frequency: 5x/week OT Frequency: 5x/week            Contractures Contractures Info: Not present    Additional Factors Info  Code Status, Allergies Code Status Info: DNR Allergies Info: CODEINE, HYDROCODONE, PERCOCET OXYCODONE-ACETAMINOPHEN            Current Medications (11/10/2018):  This is the current hospital active medication list Current Facility-Administered Medications  Medication Dose Route Frequency Provider Last Rate Last Dose  . acetaminophen (TYLENOL) tablet 1,000 mg  1,000 mg Oral Q8H Earnstine Regal, PA-C   1,000 mg at 11/10/18 1705  . amLODipine (NORVASC) tablet 5 mg  5 mg Oral Daily Earnstine Regal, PA-C   5 mg at 11/10/18 1134  . aspirin EC tablet 81 mg  81 mg Oral Daily Earnstine Regal, PA-C   81 mg at 11/10/18 1134  . enalapril (VASOTEC) tablet 20 mg  20 mg Oral Daily Earnstine Regal, PA-C   20 mg at 11/10/18 1342  . furosemide (LASIX) tablet 40 mg  40 mg Oral Daily Purohit, Shrey C, MD   40 mg at 11/10/18 1135  . gabapentin (NEURONTIN) capsule 200 mg  200 mg Oral q morning - 10a Earnstine Regal, PA-C   200 mg at 11/10/18 1135  . gabapentin (NEURONTIN) capsule 300 mg  300 mg Oral QHS Earnstine Regal, PA-C  300 mg at 11/09/18 2121  . hydrALAZINE (APRESOLINE) injection 5 mg  5 mg Intravenous Q2H PRN Earnstine Regal, PA-C      . levothyroxine (SYNTHROID, LEVOTHROID) tablet 112 mcg  112 mcg Oral QAC breakfast Earnstine Regal, PA-C   112 mcg at 11/10/18 2446  . morphine 2 MG/ML injection 2 mg  2 mg Intravenous Q2H PRN Earnstine Regal, PA-C      . ondansetron [facility]) tablet 4 mg  4 mg Oral Q6H PRN Earnstine Regal, PA-C       Or  . ondansetron [facility]) injection 4 mg  4 mg Intravenous Q6H PRN Earnstine Regal, PA-C   4 mg at 11/08/18 1340  . pantoprazole  (PROTONIX) EC tablet 40 mg  40 mg Oral Q1200 Earnstine Regal, PA-C   40 mg at 11/10/18 1341  . senna-docusate (Senokot-S) tablet 1 tablet  1 tablet Oral QHS PRN Earnstine Regal, PA-C   1 tablet at 11/10/18 1141  . traMADol (ULTRAM) tablet 50 mg  50 mg Oral Q8H PRN Earnstine Regal, PA-C   50 mg at 11/09/18 1457  . vitamin B-12 (CYANOCOBALAMIN) tablet 1,000 mcg  1,000 mcg Oral Daily Earnstine Regal, PA-C   1,000 mcg at 11/10/18 1138  . zolpidem (AMBIEN) tablet 5 mg  5 mg Oral QHS PRN Earnstine Regal, PA-C         Discharge Medications: Please see discharge summary for a list of discharge medications.  Relevant Imaging Results:  Relevant Lab Results:   Additional Information SSN: [id number]  Pricilla Holm, [place]

## 2018-11-11 NOTE — Discharge Summary (Signed)
Physician Discharge Summary  [name] FUX:[id number] DOB: July 16, 1919 DOA: 11/06/2018  PCP: Susy Frizzle, MD  Admit date: 11/06/2018 Discharge date: 11/11/2018  Admitted From: Home Disposition:  SNF  Recommendations for Outpatient Follow-up:  1. Follow up with PCP in 1-2 weeks 2. Please obtain CMP/CBC in one week   Home Health: No Equipment/Devices: No  Discharge Condition: stabke CODE STATUS: DNR Diet recommendation: Regular   Brief/Interim Summary:  #) Acute cholecystitis status post cholecystectomy on 11/08/2018: Patient was admitted with fever, abdominal pain, elevated LFTs.  CT scan and right upper quadrant ultrasound were concerning for acute cholecystitis.  Patient was started on IV Zosyn.  HIDA scan confirmed acute cholecystitis.  Patient was noted to have elevated LFTs and bilirubin.  Patient was taken to the OR and had a cholecystectomy and intraoperative cholangiogram that showed no evidence of choledocholithiasis.  Patient was advanced to regular diet.  He was evaluated by physical therapy recommended skilled nursing facility.  #) Hypertension: Patient was continued on aspirin, loading, now appropriate  #) Chronic venous stasis dermatitis: Patient's home furosemide was restarted prior to discharge.  #) Hypothyroidism: Patient is home levothyroxine was continued.  #) Pain/psych: Patient was continued on home gabapentin.  #) Stage II CKD: Stable  Discharge Diagnoses:  Principal Problem:   Cholecystitis, acute Active Problems:   Acid reflux   Hypertension   Gallstone   Hypothyroidism   CKD (chronic kidney disease) stage 3, GFR 30-59 ml/min (HCC)   Acute cholecystitis   Abnormal liver function   NHL (non-Hodgkin's lymphoma) (HCC)    Discharge Instructions   Allergies as of 11/11/2018      Reactions   Codeine Nausea And Vomiting   Hydrocodone Nausea And Vomiting   Percocet [oxycodone-acetaminophen] Nausea And Vomiting      Medication List     TAKE these medications   amLODipine 5 MG tablet Commonly known as:  NORVASC Take 1 tablet (5 mg total) by mouth daily.   aspirin EC 81 MG tablet Take 1 tablet (81 mg total) by mouth daily.   enalapril 20 MG tablet Commonly known as:  VASOTEC TAKE 1 TABLET BY MOUTH  EVERY DAY   furosemide 20 MG tablet Commonly known as:  LASIX TAKE 2 TABLETS BY MOUTH  EVERY DAY   gabapentin 100 MG capsule Commonly known as:  NEURONTIN TAKE 2 CAPSULES EVERY  MORNING   gabapentin 300 MG capsule Commonly known as:  NEURONTIN TAKE 1 CAPSULE BY MOUTH AT  BEDTIME   levothyroxine 112 MCG tablet Commonly known as:  SYNTHROID, LEVOTHROID Take 1 tablet (112 mcg total) by mouth daily.   traMADol 50 MG tablet Commonly known as:  ULTRAM TAKE 1 TABLET EVERY 8 HOURS AS NEEDED FOR PAIN   vitamin B-12 1000 MCG tablet Commonly known as:  CYANOCOBALAMIN Take 1 tablet (1,000 mcg total) by mouth daily.      Follow-up Information    [facility] Follow up on 11/27/2018.   Specialty:  General Surgery Why:  Your appointment is at 9:15 AM.  Be at the office 30 minutes early for check-in, bring photo ID and insurance information. Contact information: [address] [place] [address] [phone]          Allergies  Allergen Reactions  . Codeine Nausea And Vomiting  . Hydrocodone Nausea And Vomiting  . Percocet [Oxycodone-Acetaminophen] Nausea And Vomiting    Consultations:  General surgery   Procedures/Studies: Dg Chest 2 View  Result Date: 11/06/2018 CLINICAL  DATA:  Cough EXAM: CHEST - 2 VIEW COMPARISON:  09/02/2013, CT chest 04/08/2014 FINDINGS: Low lung volumes. Subsegmental atelectasis at the left base. No pleural effusion. Mild cardiomegaly. Moderate hiatal hernia. IMPRESSION: 1. Mild cardiomegaly with subsegmental atelectasis at the left base. 2. Moderate hiatal hernia Electronically Signed   By: Donavan Foil M.D.   On: 11/06/2018 19:55   Dg Cholangiogram  Operative  Result Date: 11/08/2018 CLINICAL DATA:  Cholelithiasis EXAM: INTRAOPERATIVE CHOLANGIOGRAM TECHNIQUE: Cholangiographic images from the C-arm fluoroscopic device were submitted for interpretation post-operatively. Please see the procedural report for the amount of contrast and the fluoroscopy time utilized. COMPARISON:  None. FINDINGS: Contrast fills the biliary tree and duodenum without filling defects in the common bile duct. IMPRESSION: Patent biliary tree. Electronically Signed   By: Marybelle Killings M.D.   On: 11/08/2018 15:16   Nm Hepatobiliary Liver Func  Result Date: 11/07/2018 CLINICAL DATA:  RIGHT upper quadrant pain, fever, leukocytosis. Cholelithiasis and gallbladder wall thickening by ultrasound question acute cholecystitis, mildly thickened gallbladder wall by CT; bilirubin 3.4 EXAM: NUCLEAR MEDICINE HEPATOBILIARY IMAGING TECHNIQUE: Sequential images of the abdomen were obtained out to 60 minutes following intravenous administration of radiopharmaceutical. RADIOPHARMACEUTICALS:  7.5 mCi Tc-29m  Choletec IV COMPARISON:  Ultrasound abdomen 11/06/2018, CT abdomen and pelvis 11/06/2018 FINDINGS: Delayed clearance of tracer from bloodstream indicating hepatocellular dysfunction. Delayed excretion of tracer into biliary tree. Gallbladder did not visualized during the first hour of imaging. Small bowel visualized at approximately 100 minutes. Delayed imaging of the gallbladder over an additional lower also fails to demonstrate tracer within the gallbladder. Significant tracer retention is seen within the liver parenchyma at the conclusion of 2 hours of imaging. IMPRESSION: Nonvisualization of the gallbladder over 2 hours consistent with acute cholecystitis. Patent CBD. Prolonged retention of tracer within hepatic parenchyma consistent with hepatocellular dysfunction. Electronically Signed   By: Lavonia Dana M.D.   On: 11/07/2018 15:02   Ct Abdomen Pelvis W Contrast  Result Date:  11/06/2018 CLINICAL DATA:  84 year old male with abdominal distention. EXAM: CT ABDOMEN AND PELVIS WITH CONTRAST TECHNIQUE: Multidetector CT imaging of the abdomen and pelvis was performed using the standard protocol following bolus administration of intravenous contrast. CONTRAST:  149mL ISOVUE-300 IOPAMIDOL (ISOVUE-300) INJECTION 61% COMPARISON:  CT dated 08/30/2013 FINDINGS: Lower chest: There are bibasilar interstitial coarsening may represent chronic changes and possible superimposed edema. Clinical correlation is recommended. Trace bilateral pleural effusions noted. There is multi vessel coronary vascular calcification. No intra-abdominal free air or free fluid. Hepatobiliary: The liver is unremarkable. No intrahepatic biliary ductal dilatation. There is mild gallbladder wall edema. No calcified stone. No pericholecystic fluid. Ultrasound may provide better evaluation of the gallbladder. Pancreas: Unremarkable. No pancreatic ductal dilatation or surrounding inflammatory changes. Spleen: Normal in size without focal abnormality. Adrenals/Urinary Tract: The adrenal glands are unremarkable. There is no hydronephrosis on either side. There is symmetric enhancement and excretion of contrast by both kidneys. The visualized ureters and urinary bladder are unremarkable. Stomach/Bowel: There is extensive sigmoid and diffuse colonic diverticulosis without active inflammatory changes. There is a moderate size hiatal hernia. There is no bowel obstruction or active inflammation. Normal appendix. Vascular/Lymphatic: Moderate aortoiliac atherosclerotic disease. There is a 2.7 cm infrarenal aortic ectasia. The IVC is grossly unremarkable. No portal venous gas. Multiple small retroperitoneal lymph nodes. No adenopathy. Reproductive: Enlarged prostate gland measuring 5.3 cm in transverse axial diameter. The seminal vesicles are symmetric. Other: None Musculoskeletal: Osteopenia with multilevel degenerative changes of the spine  and lower thoracic kyphosis. T12 lucent  lesion appears similar to prior CT of 2014, likely benign. No acute osseous pathology. IMPRESSION: 1. Mild gallbladder wall edema. No calcified stone. Ultrasound may provide better evaluation of the gallbladder. 2. Extensive colonic diverticulosis without active inflammatory changes. No bowel obstruction or active inflammation. Normal appendix. 3. Moderate size hiatal hernia. 4. Bibasilar interstitial coarsening may represent chronic changes and possible superimposed edema. Clinical correlation is recommended. 5. Multi vessel coronary vascular calcification. Electronically Signed   By: Anner Crete M.D.   On: 11/06/2018 22:36   US Abdomen Limited Ruq  Result Date: 11/07/2018 CLINICAL DATA:  84 y/o  M; nausea and vomiting. EXAM: ULTRASOUND ABDOMEN LIMITED RIGHT UPPER QUADRANT COMPARISON:  11/06/2018 CT abdomen and pelvis. FINDINGS: Gallbladder: Multiple echogenic gallstones with distal acoustic shadowing. Gallbladder wall thickening to 4 mm. Negative sonographic Murphy's sign. No pericholecystic fluid. Common bile duct: Diameter: 3 mm Liver: No focal lesion identified. Within normal limits in parenchymal echogenicity. Portal vein is patent on color Doppler imaging with normal direction of blood flow towards the liver. IMPRESSION: Cholelithiasis and gallbladder wall thickening may represent acute cholecystitis in the appropriate clinical setting. Electronically Signed   By: Kristine Garbe M.D.   On: 11/07/2018 00:06       Subjective:   Discharge Exam: Vitals:   11/10/18 2103 11/11/18 0521  BP: 140/88 (!) 155/90  Pulse: 74 67  Resp: 16 18  Temp: (!) 97.4 F (36.3 C) 97.6 F (36.4 C)  SpO2: 95% 96%   Vitals:   11/10/18 1044 11/10/18 1434 11/10/18 2103 11/11/18 0521  BP: (!) 141/81 138/84 140/88 (!) 155/90  Pulse: 75 84 74 67  Resp: 16 15 16 18   Temp: 97.6 F (36.4 C) 97.6 F (36.4 C) (!) 97.4 F (36.3 C) 97.6 F (36.4 C)  TempSrc:  Oral Oral Axillary Oral  SpO2: 94% 96% 95% 96%  Weight:      Height:        General: Pt is alert, awake, not in acute distress Cardiovascular: RRR, S1/S2 +, no rubs, no gallops Respiratory: CTA bilaterally, no wheezing, no rhonchi Abdominal: Soft, NT, ND, bowel sounds + Extremities: no edema, no cyanosis    The results of significant diagnostics from this hospitalization (including imaging, microbiology, ancillary and laboratory) are listed below for reference.     Microbiology: Recent Results (from the past 240 hour(s))  Culture, blood (Routine x 2)     Status: None (Preliminary result)   Collection Time: 11/06/18  6:55 PM  Result Value Ref Range Status   Specimen Description   Final    BLOOD LEFT ARM Performed at [facility] [address], [place], [place] [address]    Special Requests   Final    BOTTLES DRAWN AEROBIC AND ANAEROBIC Blood Culture adequate volume Performed at [facility] [address], [place], [place] [address]    Culture   Final    NO GROWTH 3 DAYS Performed at [facility], [place] [address], [place], [place] [address]    Report Status PENDING  Incomplete  Culture, blood (Routine x 2)     Status: None (Preliminary result)   Collection Time: 11/06/18  7:05 PM  Result Value Ref Range Status   Specimen Description   Final    BLOOD LEFT ANTECUBITAL Performed at [facility] [address], [place], [place] [address]    Special Requests   Final    BOTTLES DRAWN AEROBIC AND ANAEROBIC Blood Culture results may not  be optimal due to an excessive volume of blood received in culture bottles Performed at [facility] [address] [place], [place] [address]    Culture   Final    NO GROWTH 3 DAYS Performed at [facility], [place] [address], [place], [place] [address]    Report Status PENDING  Incomplete     Labs: BNP (last 3 results) No results for input(s): BNP  in the last 8760 hours. Basic Metabolic Panel: Recent Labs  Lab 11/06/18 1855 11/07/18 0332 11/08/18 0405 11/09/18 0424 11/10/18 0406  NA 142 143 141 138 140  K 4.3 4.3 4.1 4.7 4.4  CL 109 111 110 106 105  CO2 21* 25 23 20* 27  GLUCOSE 144* 112* 86 188* 109*  BUN 22 19 16 17 20   CREATININE 1.20 1.12 1.30* 1.28* 1.36*  CALCIUM 9.0 9.1 8.7* 9.0 8.9  MG  --   --  1.8  --   --    Liver Function Tests: Recent Labs  Lab 11/06/18 1855 11/07/18 0332 11/08/18 0405 11/09/18 0424 11/10/18 0406  AST 744* 538* 218* 139* 73*  ALT 421* 420* 247* 199* 138*  ALKPHOS 103 86 71 70 60  BILITOT 2.0* 3.4* 2.8* 1.6* 1.1  PROT 7.4 6.7 6.0* 6.3* 6.6  ALBUMIN 4.3 3.7 3.3* 3.5 3.6   Recent Labs  Lab 11/06/18 1855 11/07/18 0332  LIPASE 30 27   No results for input(s): AMMONIA in the last 168 hours. CBC: Recent Labs  Lab 11/06/18 1855 11/07/18 0332 11/08/18 0405 11/09/18 0424 11/10/18 0406  WBC 4.3 5.6 4.4 9.7 8.4  NEUTROABS 3.4  --   --   --   --   HGB 14.4 13.1 11.8* 13.7 13.1  HCT 45.4 40.5 36.8* 42.5 40.6  MCV 96.6 97.4 98.1 97.5 96.9  PLT 134* 135* 120* 148* 142*   Cardiac Enzymes: No results for input(s): CKTOTAL, CKMB, CKMBINDEX, TROPONINI in the last 168 hours. BNP: Invalid input(s): POCBNP CBG: No results for input(s): GLUCAP in the last 168 hours. D-Dimer No results for input(s): DDIMER in the last 72 hours. Hgb A1c No results for input(s): HGBA1C in the last 72 hours. Lipid Profile No results for input(s): CHOL, HDL, LDLCALC, TRIG, CHOLHDL, LDLDIRECT in the last 72 hours. Thyroid function studies No results for input(s): TSH, T4TOTAL, T3FREE, THYROIDAB in the last 72 hours.  Invalid input(s): FREET3 Anemia work up No results for input(s): VITAMINB12, FOLATE, FERRITIN, TIBC, IRON, RETICCTPCT in the last 72 hours. Urinalysis    Component Value Date/Time   COLORURINE YELLOW 03/03/2017 1029   APPEARANCEUR CLEAR 03/03/2017 1029   LABSPEC 1.020 03/03/2017 1029    PHURINE 5.5 03/03/2017 1029   GLUCOSEU NEGATIVE 03/03/2017 1029   HGBUR NEGATIVE 03/03/2017 1029   BILIRUBINUR NEGATIVE 03/03/2017 1029   KETONESUR NEGATIVE 03/03/2017 1029   PROTEINUR NEGATIVE 03/03/2017 1029   UROBILINOGEN 1.0 02/13/2012 1401   NITRITE NEGATIVE 03/03/2017 1029   LEUKOCYTESUR 1+ (A) 03/03/2017 1029   Sepsis Labs Invalid input(s): PROCALCITONIN,  WBC,  LACTICIDVEN Microbiology Recent Results (from the past 240 hour(s))  Culture, blood (Routine x 2)     Status: None (Preliminary result)   Collection Time: 11/06/18  6:55 PM  Result Value Ref Range Status   Specimen Description   Final    BLOOD LEFT ARM Performed at [facility], [place] [address], [place], [place] [address]    Special Requests   Final    BOTTLES DRAWN AEROBIC AND  ANAEROBIC Blood Culture adequate volume Performed at [facility] [address] [place], [place] [address]    Culture   Final    NO GROWTH 3 DAYS Performed at [facility], [place] [address] [place], [place] [address]    Report Status PENDING  Incomplete  Culture, blood (Routine x 2)     Status: None (Preliminary result)   Collection Time: 11/06/18  7:05 PM  Result Value Ref Range Status   Specimen Description   Final    BLOOD LEFT ANTECUBITAL Performed at [facility] [address], [place], [place] [address]    Special Requests   Final    BOTTLES DRAWN AEROBIC AND ANAEROBIC Blood Culture results may not be optimal due to an excessive volume of blood received in culture bottles Performed at [facility] [address], [place], [place] [address]    Culture   Final    NO GROWTH 3 DAYS Performed at [facility], [place] [address] [place], [place] [address]    Report Status PENDING  Incomplete     Time coordinating discharge: 60  SIGNED:   Cristy Folks, MD  [facility] 11/11/2018, 10:36 AM  If 7PM-7AM, please contact  night-coverage [email or web address] Password [email or web address]

## 2018-11-11 NOTE — Progress Notes (Signed)
Patient ID: [name], male   DOB: [birth year], 84 y.o.   MRN: [id number] 3 Days Post-Op   Subjective: No complaints today.  Denies abdominal pain or nausea.  Up out of bed.  Objective: Vital signs in last 24 hours: Temp:  [97.4 F (36.3 C)-97.6 F (36.4 C)] 97.6 F (36.4 C) (12/15 0521) Pulse Rate:  [67-84] 67 (12/15 0521) Resp:  [15-18] 18 (12/15 0521) BP: (138-155)/(81-90) 155/90 (12/15 0521) SpO2:  [94 %-96 %] 96 % (12/15 0521) Last BM Date: 11/10/18  Intake/Output from previous day: 12/14 0701 - 12/15 0700 In: 560 [P.O.:560] Out: 1200 [Urine:1200] Intake/Output this shift: No intake/output data recorded.  General appearance: alert, cooperative and no distress GI: normal findings: soft, non-tender Incision/Wound: No erythema or drainage  Lab Results:  Recent Labs    11/09/18 0424 11/10/18 0406  WBC 9.7 8.4  HGB 13.7 13.1  HCT 42.5 40.6  PLT 148* 142*   BMET Recent Labs    11/09/18 0424 11/10/18 0406  NA 138 140  K 4.7 4.4  CL 106 105  CO2 20* 27  GLUCOSE 188* 109*  BUN 17 20  CREATININE 1.28* 1.36*  CALCIUM 9.0 8.9     Studies/Results: No results found.  Anti-infectives: Anti-infectives (From admission, onward)   Start     Dose/Rate Route Frequency Ordered Stop   11/08/18 2200  piperacillin-tazobactam (ZOSYN) IVPB 3.375 g     3.375 g 12.5 mL/hr over 240 Minutes Intravenous Every 8 hours 11/08/18 1715 11/09/18 1113   11/07/18 0500  piperacillin-tazobactam (ZOSYN) IVPB 3.375 g  Status:  Discontinued     3.375 g 12.5 mL/hr over 240 Minutes Intravenous Every 8 hours 11/07/18 0259 11/08/18 1715   11/06/18 2000  vancomycin (VANCOCIN) 2,000 mg in sodium chloride 0.9 % 500 mL IVPB     2,000 mg 250 mL/hr over 120 Minutes Intravenous  Once 11/06/18 1942 11/07/18 0211   11/06/18 1945  ceFEPIme (MAXIPIME) 2 g in sodium chloride 0.9 % 100 mL IVPB     2 g 200 mL/hr over 30 Minutes Intravenous  Once 11/06/18 1939 11/06/18 2127   11/06/18 1945   metroNIDAZOLE (FLAGYL) IVPB 500 mg  Status:  Discontinued     500 mg 100 mL/hr over 60 Minutes Intravenous Every 8 hours 11/06/18 1939 11/07/18 0233   11/06/18 1945  vancomycin (VANCOCIN) IVPB 1000 mg/200 mL premix  Status:  Discontinued     1,000 mg 200 mL/hr over 60 Minutes Intravenous  Once 11/06/18 1939 11/06/18 1942      Assessment/Plan: s/p Procedure(s): LAPAROSCOPIC CHOLECYSTECTOMY WITH INTRAOPERATIVE CHOLANGIOGRAM Doing well. Decondition.  Plan SNF possibly tomorrow.   LOS: 4 days    Edward Jolly 11/11/2018

## 2018-11-12 DIAGNOSIS — I10 Essential (primary) hypertension: Secondary | ICD-10-CM

## 2018-11-12 DIAGNOSIS — N183 Chronic kidney disease, stage 3 (moderate): Secondary | ICD-10-CM

## 2018-11-12 DIAGNOSIS — K810 Acute cholecystitis: Principal | ICD-10-CM

## 2018-11-12 DIAGNOSIS — K8000 Calculus of gallbladder with acute cholecystitis without obstruction: Secondary | ICD-10-CM

## 2018-11-12 LAB — CULTURE, BLOOD (ROUTINE X 2)
Culture: NO GROWTH
Culture: NO GROWTH
Special Requests: ADEQUATE

## 2018-11-12 NOTE — Care Management Important Message (Signed)
Important Message  Patient Details  Name: [name] MRN: [id number] Date of Birth: 1919-06-28   Medicare Important Message Given:  Yes    Kerin Salen 11/12/2018, 3:13 PMImportant Message  Patient Details  Name: [name] MRN: [id number] Date of Birth: [birth year]   Medicare Important Message Given:  Yes    Kerin Salen 11/12/2018, 3:13 PM

## 2018-11-12 NOTE — Progress Notes (Signed)
[facility] PROGRESS NOTE  [name] PFX:[id number] DOB: [birth year] DOA: 11/06/2018 PCP: Susy Frizzle, MD  Brief summary   84 year old with past medical history relevant for non-Hodgkin's lymphoma in remission, hypertension, hypothyroidism, chronic pain, reflux, stage II CKD admitted with fever and vague reports of abdominal pain and found to have possible acute cholecystitis on CT and right upper quadrant ultrasound status post cholecystectomy on 11/08/2018.  Assessment/Plan:   Acute cholecystitis status post cholecystectomy on 11/08/2018: Patient was admitted with fever, abdominal pain, elevated LFTs.  CT scan and right upper quadrant ultrasound were concerning for acute cholecystitis.  Patient was started on IV Zosyn.  HIDA scan confirmed acute cholecystitis.  Patient was noted to have elevated LFTs and bilirubin.  Patient was taken to the OR and had a cholecystectomy and intraoperative cholangiogram that showed no evidence of choledocholithiasis.  Patient was advanced to regular diet.  He was evaluated by physical therapy recommended skilled nursing facility.  Hypertension: Patient was continued on aspirin, loading, now appropriate  Chronic venous stasis dermatitis: Patient's home furosemide was restarted prior to discharge.  Hypothyroidism: Patient is home levothyroxine was continued.  Pain/psych: Patient was continued on home gabapentin.  Stage II CKD: Stable  Code Status: DNR Family Communication: d/w patient, his family, RN (indicate person spoken with, relationship, and if by phone, the number) Disposition Plan: awaiting SNF   Consultants:  Surgery   Procedures:  11/07/2018 HIDA scan: No evidence of acute cholecystitis, no choledocholithiasis  Antibiotics: Anti-infectives (From admission, onward)   Start     Dose/Rate Route Frequency Ordered Stop   11/08/18 2200  piperacillin-tazobactam (ZOSYN) IVPB 3.375 g     3.375 g 12.5 mL/hr over 240 Minutes  Intravenous Every 8 hours 11/08/18 1715 11/09/18 1113   11/07/18 0500  piperacillin-tazobactam (ZOSYN) IVPB 3.375 g  Status:  Discontinued     3.375 g 12.5 mL/hr over 240 Minutes Intravenous Every 8 hours 11/07/18 0259 11/08/18 1715   11/06/18 2000  vancomycin (VANCOCIN) 2,000 mg in sodium chloride 0.9 % 500 mL IVPB     2,000 mg 250 mL/hr over 120 Minutes Intravenous  Once 11/06/18 1942 11/07/18 0211   11/06/18 1945  ceFEPIme (MAXIPIME) 2 g in sodium chloride 0.9 % 100 mL IVPB     2 g 200 mL/hr over 30 Minutes Intravenous  Once 11/06/18 1939 11/06/18 2127   11/06/18 1945  metroNIDAZOLE (FLAGYL) IVPB 500 mg  Status:  Discontinued     500 mg 100 mL/hr over 60 Minutes Intravenous Every 8 hours 11/06/18 1939 11/07/18 0233   11/06/18 1945  vancomycin (VANCOCIN) IVPB 1000 mg/200 mL premix  Status:  Discontinued     1,000 mg 200 mL/hr over 60 Minutes Intravenous  Once 11/06/18 1939 11/06/18 1942        (indicate start date, and stop date if known)  HPI/Subjective: No distress. Reports feeling well. awaiting SNF  Objective: Vitals:   11/11/18 2328 11/12/18 0524  BP: (!) 149/86 111/82  Pulse: 73 67  Resp: 18 18  Temp: 98.1 F (36.7 C) (!) 97.5 F (36.4 C)  SpO2: 97% 97%    Intake/Output Summary (Last 24 hours) at 11/12/2018 0909 Last data filed at 11/12/2018 3299 Gross per 24 hour  Intake 800 ml  Output 1200 ml  Net -400 ml   Filed Weights   11/06/18 1856 11/07/18 0431  Weight: 90.7 kg 97.9 kg    Exam:   General:  No distress   Cardiovascular: s1,s2 rrr  Respiratory: CTA BL  Abdomen: soft, nt   Musculoskeletal: no leg edema    Data Reviewed: Basic Metabolic Panel: Recent Labs  Lab 11/06/18 1855 11/07/18 0332 11/08/18 0405 11/09/18 0424 11/10/18 0406  NA 142 143 141 138 140  K 4.3 4.3 4.1 4.7 4.4  CL 109 111 110 106 105  CO2 21* 25 23 20* 27  GLUCOSE 144* 112* 86 188* 109*  BUN 22 19 16 17 20   CREATININE 1.20 1.12 1.30* 1.28* 1.36*  CALCIUM 9.0 9.1  8.7* 9.0 8.9  MG  --   --  1.8  --   --    Liver Function Tests: Recent Labs  Lab 11/06/18 1855 11/07/18 0332 11/08/18 0405 11/09/18 0424 11/10/18 0406  AST 744* 538* 218* 139* 73*  ALT 421* 420* 247* 199* 138*  ALKPHOS 103 86 71 70 60  BILITOT 2.0* 3.4* 2.8* 1.6* 1.1  PROT 7.4 6.7 6.0* 6.3* 6.6  ALBUMIN 4.3 3.7 3.3* 3.5 3.6   Recent Labs  Lab 11/06/18 1855 11/07/18 0332  LIPASE 30 27   No results for input(s): AMMONIA in the last 168 hours. CBC: Recent Labs  Lab 11/06/18 1855 11/07/18 0332 11/08/18 0405 11/09/18 0424 11/10/18 0406  WBC 4.3 5.6 4.4 9.7 8.4  NEUTROABS 3.4  --   --   --   --   HGB 14.4 13.1 11.8* 13.7 13.1  HCT 45.4 40.5 36.8* 42.5 40.6  MCV 96.6 97.4 98.1 97.5 96.9  PLT 134* 135* 120* 148* 142*   Cardiac Enzymes: No results for input(s): CKTOTAL, CKMB, CKMBINDEX, TROPONINI in the last 168 hours. BNP (last 3 results) No results for input(s): BNP in the last 8760 hours.  ProBNP (last 3 results) No results for input(s): PROBNP in the last 8760 hours.  CBG: No results for input(s): GLUCAP in the last 168 hours.  Recent Results (from the past 240 hour(s))  Culture, blood (Routine x 2)     Status: None   Collection Time: 11/06/18  6:55 PM  Result Value Ref Range Status   Specimen Description   Final    BLOOD LEFT ARM Performed at [facility] [address], [place], [place] [address]    Special Requests   Final    BOTTLES DRAWN AEROBIC AND ANAEROBIC Blood Culture adequate volume Performed at [facility] [address], [place], [place] [address]    Culture   Final    NO GROWTH 5 DAYS Performed at [facility], [place] [address] [place], [place] [address]    Report Status 11/12/2018 FINAL  Final  Culture, blood (Routine x 2)     Status: None   Collection Time: 11/06/18  7:05 PM  Result Value Ref Range Status   Specimen Description   Final    BLOOD LEFT ANTECUBITAL Performed at [facility] [address], [place], [place] [address]    Special Requests   Final    BOTTLES DRAWN AEROBIC AND ANAEROBIC Blood Culture results may not be optimal due to an excessive volume of blood received in culture bottles Performed at [facility] [address] [place], [place] [address]    Culture   Final    NO GROWTH 5 DAYS Performed at [facility], [place] [address] [place], [place] [address]    Report Status 11/12/2018 FINAL  Final     Studies: No results found.  Scheduled Meds: . acetaminophen  1,000 mg Oral Q8H  . amLODipine  5 mg Oral Daily  . aspirin EC  81 mg Oral Daily  . enalapril  20 mg Oral Daily  . furosemide  40 mg Oral Daily  . gabapentin  200 mg Oral q morning - 10a  . gabapentin  300 mg Oral QHS  . levothyroxine  112 mcg Oral QAC breakfast  . pantoprazole  40 mg Oral Q1200  . vitamin B-12  1,000 mcg Oral Daily   Continuous Infusions:  Principal Problem:   Cholecystitis, acute Active Problems:   Acid reflux   Hypertension   Gallstone   Hypothyroidism   CKD (chronic kidney disease) stage 3, GFR 30-59 ml/min (HCC)   Acute cholecystitis   Abnormal liver function   NHL (non-Hodgkin's lymphoma) ([place])    Time spent: >35 minutes     Kinnie Feil  [facility] Pager [phone]. If 7PM-7AM, please contact night-coverage at [email or web address], password [facility] 11/12/2018, 9:09 AM  LOS: 5 days

## 2018-11-12 NOTE — Progress Notes (Signed)
Physical Therapy Treatment Patient Details Name: [name] MRN: [id number] DOB: 1919/09/21 Today's Date: 11/12/2018    History of Present Illness s/p lap chole.  PMH:  PVD, HTN, NHL in remission, amputation L great toe    PT Comments    POD # 4 Assisted pt out of recliner.  General transfer comment: assist to rise and stabilize. Pt with posterior bias and poor posture.  amb to bathroom.  Assisted with toilet transfer.  General transfer comment: assist to rise and stabilize. Pt with posterior bias and poor posture  Pt will need ST Rehab at SNF to regain prior level of mobility.    Follow Up Recommendations  SNF     Equipment Recommendations  None recommended by PT    Recommendations for Other Services       Precautions / Restrictions Precautions Precautions: Fall Precaution Comments: s/p L great toe amp (balance)  Restrictions Weight Bearing Restrictions: No    Mobility  Bed Mobility Overal bed mobility: Needs Assistance Bed Mobility: Sit to Supine       Sit to supine: Min assist   General bed mobility comments: assist B LE up onto bed due to ABD pain and recent surgery   Transfers Overall transfer level: Needs assistance Equipment used: Rolling walker (2 wheeled) Transfers: Sit to/from [facility] Sit to Stand: Mod assist Stand pivot transfers: Mod assist;Max assist       General transfer comment: assist to rise and stabilize. Pt with posterior bias and poor posture   Ambulation/Gait Ambulation/Gait assistance: Mod assist;+2 safety/equipment Gait Distance (Feet): 35 Feet Assistive device: Rolling walker (2 wheeled) Gait Pattern/deviations: Step-through pattern;Step-to pattern;Drifts right/left;Staggering right;Staggering left Gait velocity: decr   General Gait Details: very unsteady gait with poor forward flex posture and flex B hips and knees.  Also present with delayed coorective reaction.  HIGH FALL RISK.   Stairs              Wheelchair Mobility    Modified Rankin (Stroke Patients Only)       Balance                                            Cognition Arousal/Alertness: Awake/alert Behavior During Therapy: WFL for tasks assessed/performed Overall Cognitive Status: Within Functional Limits for tasks assessed                                 General Comments: AxO x 4 pleasant and motivated       Exercises      General Comments        Pertinent Vitals/Pain Pain Assessment: Faces Pain Score: 3  Pain Location: abdomen/incisions Pain Descriptors / Indicators: Grimacing Pain Intervention(s): Monitored during session    Home Living                      Prior Function            PT Goals (current goals can now be found in the care plan section) Progress towards PT goals: Progressing toward goals    Frequency    Min 2X/week      PT Plan Current plan remains appropriate    Co-evaluation              AM-PAC PT "6 Clicks" Mobility  Outcome Measure  Help needed turning from your back to your side while in a flat bed without using bedrails?: A Lot Help needed moving from lying on your back to sitting on the side of a flat bed without using bedrails?: A Lot Help needed moving to and from a bed to a chair (including a wheelchair)?: A Lot Help needed standing up from a chair using your arms (e.g., wheelchair or bedside chair)?: A Lot Help needed to walk in hospital room?: Total Help needed climbing 3-5 steps with a railing? : Total 6 Click Score: 10    End of Session Equipment Utilized During Treatment: Gait belt Activity Tolerance: Patient tolerated treatment well Patient left: in bed;with call bell/phone within reach;with bed alarm set Nurse Communication: Mobility status PT Visit Diagnosis: Unsteadiness on feet (R26.81);Pain     Time: 1030-1055 PT Time Calculation (min) (ACUTE ONLY): 25 min  Charges:  $Gait Training: 8-22  mins                     Rica Koyanagi  PTA Acute  Rehabilitation Services Pager      [phone] Office      [phone]

## 2018-11-12 NOTE — Progress Notes (Signed)
[facility] Progress Note  4 Days Post-Op  Subjective: CC: no complaints Patient denies abdominal pain. Tolerating diet. Had 2 BMs yesterday. Son at bedside and would like to see him up walking more.   Objective: Vital signs in last 24 hours: Temp:  [97.5 F (36.4 C)-98.1 F (36.7 C)] 97.5 F (36.4 C) (12/16 0524) Pulse Rate:  [66-73] 67 (12/16 0524) Resp:  [14-18] 18 (12/16 0524) BP: (107-149)/(68-86) 111/82 (12/16 0524) SpO2:  [97 %-98 %] 97 % (12/16 0524) Last BM Date: 11/11/18  Intake/Output from previous day: 12/15 0701 - 12/16 0700 In: 1040 [P.O.:1040] Out: 1200 [Urine:1200] Intake/Output this shift: Total I/O In: 120 [P.O.:120] Out: -   PE: Gen:  Alert, NAD, pleasant Card:  Regular rate and rhythm, pedal pulses 2+ BL Pulm:  Normal effort, clear to auscultation bilaterally Abd: Soft, non-tender, non-distended, bowel sounds present, no HSM, incisions C/D/I with moderate ecchymosis Skin: warm and dry, no rashes  Psych: A&Ox3   Lab Results:  Recent Labs    11/10/18 0406  WBC 8.4  HGB 13.1  HCT 40.6  PLT 142*   BMET Recent Labs    11/10/18 0406  NA 140  K 4.4  CL 105  CO2 27  GLUCOSE 109*  BUN 20  CREATININE 1.36*  CALCIUM 8.9   PT/INR No results for input(s): LABPROT, INR in the last 72 hours. CMP     Component Value Date/Time   NA 140 11/10/2018 0406   NA 146 (H) 04/08/2014 0957   K 4.4 11/10/2018 0406   K 4.2 04/08/2014 0957   CL 105 11/10/2018 0406   CO2 27 11/10/2018 0406   CO2 26 04/08/2014 0957   GLUCOSE 109 (H) 11/10/2018 0406   GLUCOSE 91 04/08/2014 0957   BUN 20 11/10/2018 0406   BUN 17.5 04/08/2014 0957   CREATININE 1.36 (H) 11/10/2018 0406   CREATININE 1.49 (H) 02/20/2018 1029   CREATININE 1.4 (H) 04/08/2014 0957   CALCIUM 8.9 11/10/2018 0406   CALCIUM 9.6 04/08/2014 0957   PROT 6.6 11/10/2018 0406   PROT 6.7 04/08/2014 0957   ALBUMIN 3.6 11/10/2018 0406   ALBUMIN 3.9 04/08/2014 0957   AST 73 (H) 11/10/2018  0406   AST 19 04/08/2014 0957   ALT 138 (H) 11/10/2018 0406   ALT 13 04/08/2014 0957   ALKPHOS 60 11/10/2018 0406   ALKPHOS 64 04/08/2014 0957   BILITOT 1.1 11/10/2018 0406   BILITOT 0.66 04/08/2014 0957   GFRNONAA 43 (L) 11/10/2018 0406   GFRNONAA 39 (L) 02/20/2018 1029   GFRAA 50 (L) 11/10/2018 0406   GFRAA 45 (L) 02/20/2018 1029   Lipase     Component Value Date/Time   LIPASE 27 11/07/2018 0332       Studies/Results: No results found.  Anti-infectives: Anti-infectives (From admission, onward)   Start     Dose/Rate Route Frequency Ordered Stop   11/08/18 2200  piperacillin-tazobactam (ZOSYN) IVPB 3.375 g     3.375 g 12.5 mL/hr over 240 Minutes Intravenous Every 8 hours 11/08/18 1715 11/09/18 1113   11/07/18 0500  piperacillin-tazobactam (ZOSYN) IVPB 3.375 g  Status:  Discontinued     3.375 g 12.5 mL/hr over 240 Minutes Intravenous Every 8 hours 11/07/18 0259 11/08/18 1715   11/06/18 2000  vancomycin (VANCOCIN) 2,000 mg in sodium chloride 0.9 % 500 mL IVPB     2,000 mg 250 mL/hr over 120 Minutes Intravenous  Once 11/06/18 1942 11/07/18 0211   11/06/18 1945  ceFEPIme (MAXIPIME) 2 g  in sodium chloride 0.9 % 100 mL IVPB     2 g 200 mL/hr over 30 Minutes Intravenous  Once 11/06/18 1939 11/06/18 2127   11/06/18 1945  metroNIDAZOLE (FLAGYL) IVPB 500 mg  Status:  Discontinued     500 mg 100 mL/hr over 60 Minutes Intravenous Every 8 hours 11/06/18 1939 11/07/18 0233   11/06/18 1945  vancomycin (VANCOCIN) IVPB 1000 mg/200 mL premix  Status:  Discontinued     1,000 mg 200 mL/hr over 60 Minutes Intravenous  Once 11/06/18 1939 11/06/18 1942       Assessment/Plan HTN Chronic venous stasis dermatitis Hypothyroidism Stage II CKD  Acute cholecystitis S/P lap chole 12/12 Dr. [name] - POD4 - tolerating diet - incisions c/d/i - continue to mobilize - stable for discharge to SNF when bed available, will ensure patient has surgical follow up  LOS: 5 days    Brigid Re  , [facility] 11/12/2018, 9:33 AM Pager: [phone] Consults: [phone] Mon-Fri 7:00 am-4:30 pm Sat-Sun 7:00 am-11:30 am

## 2018-11-13 DIAGNOSIS — I87393 Chronic venous hypertension (idiopathic) with other complications of bilateral lower extremity: Secondary | ICD-10-CM | POA: Diagnosis not present

## 2018-11-13 DIAGNOSIS — R338 Other retention of urine: Secondary | ICD-10-CM | POA: Diagnosis not present

## 2018-11-13 DIAGNOSIS — K810 Acute cholecystitis: Secondary | ICD-10-CM | POA: Diagnosis not present

## 2018-11-13 DIAGNOSIS — E039 Hypothyroidism, unspecified: Secondary | ICD-10-CM

## 2018-11-13 DIAGNOSIS — C8590 Non-Hodgkin lymphoma, unspecified, unspecified site: Secondary | ICD-10-CM | POA: Diagnosis not present

## 2018-11-13 DIAGNOSIS — N183 Chronic kidney disease, stage 3 (moderate): Secondary | ICD-10-CM | POA: Diagnosis not present

## 2018-11-13 DIAGNOSIS — R278 Other lack of coordination: Secondary | ICD-10-CM | POA: Diagnosis not present

## 2018-11-13 DIAGNOSIS — Z743 Need for continuous supervision: Secondary | ICD-10-CM | POA: Diagnosis not present

## 2018-11-13 DIAGNOSIS — E038 Other specified hypothyroidism: Secondary | ICD-10-CM | POA: Diagnosis not present

## 2018-11-13 DIAGNOSIS — R279 Unspecified lack of coordination: Secondary | ICD-10-CM | POA: Diagnosis not present

## 2018-11-13 DIAGNOSIS — Z48815 Encounter for surgical aftercare following surgery on the digestive system: Secondary | ICD-10-CM | POA: Diagnosis not present

## 2018-11-13 DIAGNOSIS — G894 Chronic pain syndrome: Secondary | ICD-10-CM | POA: Diagnosis not present

## 2018-11-13 DIAGNOSIS — I739 Peripheral vascular disease, unspecified: Secondary | ICD-10-CM | POA: Diagnosis not present

## 2018-11-13 DIAGNOSIS — I872 Venous insufficiency (chronic) (peripheral): Secondary | ICD-10-CM | POA: Diagnosis not present

## 2018-11-13 DIAGNOSIS — I10 Essential (primary) hypertension: Secondary | ICD-10-CM | POA: Diagnosis not present

## 2018-11-13 DIAGNOSIS — R2681 Unsteadiness on feet: Secondary | ICD-10-CM | POA: Diagnosis not present

## 2018-11-13 NOTE — Clinical Social Work Placement (Signed)
D/C Summary sent.  Nurse call report: [phone] [facility] arranged to transport.   CLINICAL SOCIAL WORK PLACEMENT  NOTE  Date:  11/13/2018  Patient Details  Name: [name] MRN: [id number] Date of Birth: 1919-07-05  Clinical Social Work is seeking post-discharge placement for this patient at the [place] level of care (*CSW will initial, date and re-position this form in  chart as items are completed):  Yes   Patient/family provided with [facility] Work Department's list of facilities offering this level of care within the geographic area requested by the patient (or if unable, by the patient's family).  Yes   Patient/family informed of their freedom to choose among providers that offer the needed level of care, that participate in Medicare, Medicaid or managed care program needed by the patient, have an available bed and are willing to accept the patient.  Yes   Patient/family informed of [facility]'s ownership interest in [facility] and [facility], as well as of the fact that they are under no obligation to receive care at these facilities.  PASRR submitted to EDS on       PASRR number received on       Existing PASRR number confirmed on 11/13/18     FL2 transmitted to all facilities in geographic area requested by pt/family on       FL2 transmitted to all facilities within larger geographic area on 11/11/18     Patient informed that his/her managed care company has contracts with or will negotiate with certain facilities, including the following:  [facility]     Yes   Patient/family informed of bed offers received.  Patient chooses bed at [facility]     Physician recommends and patient chooses bed at      Patient to be transferred to [facility] on 11/13/18.  Patient to be transferred to facility by [facility]     Patient family notified on 11/13/18 of transfer.  Name of family  member notified:  Son- [name]      PHYSICIAN Please prepare prescriptions     Additional Comment:    _______________________________________________ Lia Hopping, LCSW 11/13/2018, 10:55 AM

## 2018-11-13 NOTE — Progress Notes (Signed)
Pt alert and oriented, transported by [facility] to [facility]. Report given to receiving RN.

## 2018-11-13 NOTE — Progress Notes (Signed)
[facility] PROGRESS NOTE  [name] NFA:[id number] DOB: December 08, 1918 DOA: 11/06/2018 PCP: Susy Frizzle, MD  Brief summary   84 year old with past medical history relevant for non-Hodgkin's lymphoma in remission, hypertension, hypothyroidism, chronic pain, reflux, stage II CKD admitted with fever and vague reports of abdominal pain and found to have possible acute cholecystitis on CT and right upper quadrant ultrasound status post cholecystectomy on 11/08/2018.  Assessment/Plan:   Acute cholecystitis status post cholecystectomy on 11/08/2018: Patient was admitted with fever, abdominal pain, elevated LFTs.  CT scan and right upper quadrant ultrasound were concerning for acute cholecystitis.  Patient was started on IV Zosyn.  HIDA scan confirmed acute cholecystitis.  Patient was noted to have elevated LFTs and bilirubin.  Patient was taken to the OR and had a cholecystectomy and intraoperative cholangiogram that showed no evidence of choledocholithiasis.  Patient was advanced to regular diet.  He was evaluated by physical therapy recommended skilled nursing facility.  Hypertension: Patient was continued on aspirin, loading, now appropriate  Chronic venous stasis dermatitis: Patient's home furosemide was restarted prior to discharge.  Hypothyroidism: Patient is home levothyroxine was continued.  Chronic Pain. Patient was continued on home gabapentin.  Stage II CKD: Stable  Code Status: DNR Family Communication: d/w patient, his family, RN (indicate person spoken with, relationship, and if by phone, the number) Disposition Plan: awaiting SNF   Consultants:  Surgery   Procedures:  11/07/2018 HIDA scan: No evidence of acute cholecystitis, no choledocholithiasis  Antibiotics: Anti-infectives (From admission, onward)   Start     Dose/Rate Route Frequency Ordered Stop   11/08/18 2200  piperacillin-tazobactam (ZOSYN) IVPB 3.375 g     3.375 g 12.5 mL/hr over 240  Minutes Intravenous Every 8 hours 11/08/18 1715 11/09/18 1113   11/07/18 0500  piperacillin-tazobactam (ZOSYN) IVPB 3.375 g  Status:  Discontinued     3.375 g 12.5 mL/hr over 240 Minutes Intravenous Every 8 hours 11/07/18 0259 11/08/18 1715   11/06/18 2000  vancomycin (VANCOCIN) 2,000 mg in sodium chloride 0.9 % 500 mL IVPB     2,000 mg 250 mL/hr over 120 Minutes Intravenous  Once 11/06/18 1942 11/07/18 0211   11/06/18 1945  ceFEPIme (MAXIPIME) 2 g in sodium chloride 0.9 % 100 mL IVPB     2 g 200 mL/hr over 30 Minutes Intravenous  Once 11/06/18 1939 11/06/18 2127   11/06/18 1945  metroNIDAZOLE (FLAGYL) IVPB 500 mg  Status:  Discontinued     500 mg 100 mL/hr over 60 Minutes Intravenous Every 8 hours 11/06/18 1939 11/07/18 0233   11/06/18 1945  vancomycin (VANCOCIN) IVPB 1000 mg/200 mL premix  Status:  Discontinued     1,000 mg 200 mL/hr over 60 Minutes Intravenous  Once 11/06/18 1939 11/06/18 1942       (indicate start date, and stop date if known)  HPI/Subjective:  Reports feeling well. awaiting SNF  Objective: Vitals:   11/13/18 0542 11/13/18 0915  BP: 113/72 115/72  Pulse: 69 75  Resp:  14  Temp: 97.9 F (36.6 C) 97.9 F (36.6 C)  SpO2: 94% 95%    Intake/Output Summary (Last 24 hours) at 11/13/2018 0938 Last data filed at 11/13/2018 0929 Gross per 24 hour  Intake 360 ml  Output 275 ml  Net 85 ml   Filed Weights   11/06/18 1856 11/07/18 0431  Weight: 90.7 kg 97.9 kg    Exam:   General:  No distress   Cardiovascular: s1,s2 rrr  Respiratory: CTA BL  Abdomen: soft,  nt   Musculoskeletal: no leg edema    Data Reviewed: Basic Metabolic Panel: Recent Labs  Lab 11/06/18 1855 11/07/18 0332 11/08/18 0405 11/09/18 0424 11/10/18 0406  NA 142 143 141 138 140  K 4.3 4.3 4.1 4.7 4.4  CL 109 111 110 106 105  CO2 21* 25 23 20* 27  GLUCOSE 144* 112* 86 188* 109*  BUN 22 19 16 17 20   CREATININE 1.20 1.12 1.30* 1.28* 1.36*  CALCIUM 9.0 9.1 8.7* 9.0 8.9  MG   --   --  1.8  --   --    Liver Function Tests: Recent Labs  Lab 11/06/18 1855 11/07/18 0332 11/08/18 0405 11/09/18 0424 11/10/18 0406  AST 744* 538* 218* 139* 73*  ALT 421* 420* 247* 199* 138*  ALKPHOS 103 86 71 70 60  BILITOT 2.0* 3.4* 2.8* 1.6* 1.1  PROT 7.4 6.7 6.0* 6.3* 6.6  ALBUMIN 4.3 3.7 3.3* 3.5 3.6   Recent Labs  Lab 11/06/18 1855 11/07/18 0332  LIPASE 30 27   No results for input(s): AMMONIA in the last 168 hours. CBC: Recent Labs  Lab 11/06/18 1855 11/07/18 0332 11/08/18 0405 11/09/18 0424 11/10/18 0406  WBC 4.3 5.6 4.4 9.7 8.4  NEUTROABS 3.4  --   --   --   --   HGB 14.4 13.1 11.8* 13.7 13.1  HCT 45.4 40.5 36.8* 42.5 40.6  MCV 96.6 97.4 98.1 97.5 96.9  PLT 134* 135* 120* 148* 142*   Cardiac Enzymes: No results for input(s): CKTOTAL, CKMB, CKMBINDEX, TROPONINI in the last 168 hours. BNP (last 3 results) No results for input(s): BNP in the last 8760 hours.  ProBNP (last 3 results) No results for input(s): PROBNP in the last 8760 hours.  CBG: No results for input(s): GLUCAP in the last 168 hours.  Recent Results (from the past 240 hour(s))  Culture, blood (Routine x 2)     Status: None   Collection Time: 11/06/18  6:55 PM  Result Value Ref Range Status   Specimen Description   Final    BLOOD LEFT ARM Performed at [facility] [address] [place], [place] [address]    Special Requests   Final    BOTTLES DRAWN AEROBIC AND ANAEROBIC Blood Culture adequate volume Performed at [facility] [address] [place], [place] [address]    Culture   Final    NO GROWTH 5 DAYS Performed at [facility], [place] [address], [place], [place] [address]    Report Status 11/12/2018 FINAL  Final  Culture, blood (Routine x 2)     Status: None   Collection Time: 11/06/18  7:05 PM  Result Value Ref Range Status   Specimen Description   Final    BLOOD LEFT ANTECUBITAL Performed at [facility] [address] [place], [place] [address]    Special Requests   Final    BOTTLES DRAWN AEROBIC AND ANAEROBIC Blood Culture results may not be optimal due to an excessive volume of blood received in culture bottles Performed at [facility] [address] [place], [place] [address]    Culture   Final    NO GROWTH 5 DAYS Performed at [facility], [place] [address], [place], [place] [address]    Report Status 11/12/2018 FINAL  Final     Studies: No results found.  Scheduled Meds: . acetaminophen  1,000 mg Oral Q8H  . amLODipine  5 mg  Oral Daily  . aspirin EC  81 mg Oral Daily  . enalapril  20 mg Oral Daily  . furosemide  40 mg Oral Daily  . gabapentin  200 mg Oral q morning - 10a  . gabapentin  300 mg Oral QHS  . levothyroxine  112 mcg Oral QAC breakfast  . pantoprazole  40 mg Oral Q1200  . vitamin B-12  1,000 mcg Oral Daily   Continuous Infusions:  Principal Problem:   Cholecystitis, acute Active Problems:   Acid reflux   Hypertension   Gallstone   Hypothyroidism   CKD (chronic kidney disease) stage 3, GFR 30-59 ml/min (HCC)   Acute cholecystitis   Abnormal liver function   NHL (non-Hodgkin's lymphoma) ([place])    Time spent: >35 minutes     Kinnie Feil  [facility] Pager [phone]. If 7PM-7AM, please contact night-coverage at [email or web address], password [facility] 11/13/2018, 9:38 AM  LOS: 6 days

## 2018-11-13 NOTE — Discharge Summary (Signed)
Physician Discharge Summary  [name] MHD:[id number] DOB: 18-Dec-1918 DOA: 11/06/2018  PCP: Susy Frizzle, MD  Admit date: 11/06/2018 Discharge date: 11/13/2018  Time spent: >35 minutes  Recommendations for Outpatient Follow-up:  PCP in 3-7 days Surgery  in 10-14 day s  Discharge Diagnoses:  Principal Problem:   Cholecystitis, acute Active Problems:   Acid reflux   Hypertension   Gallstone   Hypothyroidism   CKD (chronic kidney disease) stage 3, GFR 30-59 ml/min (HCC)   Acute cholecystitis   Abnormal liver function   NHL (non-Hodgkin's lymphoma) ([place])   Discharge Condition: stable   Diet recommendation: regular   Filed Weights   11/06/18 1856 11/07/18 0431  Weight: 90.7 kg 97.9 kg    History of present illness:   84 year old with past medical history relevant for non-Hodgkin's lymphoma in remission, hypertension, hypothyroidism, chronic pain, reflux, stage II CKD admitted with fever and vague reports of abdominal pain and found to have possible acute cholecystitis on CT and right upper quadrant ultrasoundstatus post cholecystectomy on 11/08/2018.   Hospital Course:   Acute cholecystitis status post cholecystectomy on 11/08/2018: Patient was admitted with fever, abdominal pain, elevated LFTs. CT scan and right upper quadrant ultrasound were concerning for acute cholecystitis. Patient was started on IV Zosyn. HIDA scan confirmed acute cholecystitis. Patient was noted to have elevated LFTs and bilirubin. Patient was taken to the OR and had a cholecystectomy and intraoperative cholangiogram that showed no evidence of choledocholithiasis. Patient was advanced to regular diet. He was evaluated by physical therapy recommended skilled nursing facility. F/u with surgery in 2 weeks   Hypertension: resume home regimen. Stable   Chronic venous stasis dermatitis: Patient's home furosemide was restarted prior to discharge.  Hypothyroidism: Patient is home  levothyroxine was continued.  Chronic Pain. Patient was continued on home gabapentin.  Stage II CKD: Stable  Procedures:  status post cholecystectomy  (i.e. Studies not automatically included, echos, thoracentesis, etc; not x-rays)  Consultations:  Surgery   Discharge Exam: Vitals:   11/13/18 0542 11/13/18 0915  BP: 113/72 115/72  Pulse: 69 75  Resp:  14  Temp: 97.9 F (36.6 C) 97.9 F (36.6 C)  SpO2: 94% 95%    General: no distress  Cardiovascular: s1,s2 rrr Respiratory: CTA BL  Discharge Instructions  Discharge Instructions    Diet - low sodium heart healthy   Complete by:  As directed    Increase activity slowly   Complete by:  As directed      Allergies as of 11/13/2018      Reactions   Codeine Nausea And Vomiting   Hydrocodone Nausea And Vomiting   Percocet [oxycodone-acetaminophen] Nausea And Vomiting      Medication List    STOP taking these medications   traMADol 50 MG tablet Commonly known as:  ULTRAM     TAKE these medications   amLODipine 5 MG tablet Commonly known as:  NORVASC Take 1 tablet (5 mg total) by mouth daily.   aspirin EC 81 MG tablet Take 1 tablet (81 mg total) by mouth daily.   enalapril 20 MG tablet Commonly known as:  VASOTEC TAKE 1 TABLET BY MOUTH  EVERY DAY   furosemide 20 MG tablet Commonly known as:  LASIX TAKE 2 TABLETS BY MOUTH  EVERY DAY   gabapentin 100 MG capsule Commonly known as:  NEURONTIN TAKE 2 CAPSULES EVERY  MORNING   gabapentin 300 MG capsule Commonly known as:  NEURONTIN TAKE 1 CAPSULE BY MOUTH AT  BEDTIME  levothyroxine 112 MCG tablet Commonly known as:  SYNTHROID, LEVOTHROID Take 1 tablet (112 mcg total) by mouth daily.   vitamin B-12 1000 MCG tablet Commonly known as:  CYANOCOBALAMIN Take 1 tablet (1,000 mcg total) by mouth daily.      Allergies  Allergen Reactions  . Codeine Nausea And Vomiting  . Hydrocodone Nausea And Vomiting  . Percocet [Oxycodone-Acetaminophen] Nausea And  Vomiting   Follow-up Information    Surgery, [facility] Follow up on 11/27/2018.   Specialty:  General Surgery Why:  Your appointment is at 9:15 AM.  Be at the office 30 minutes early for check-in, bring photo ID and insurance information. Contact information: [place] [address] [phone]            The results of significant diagnostics from this hospitalization (including imaging, microbiology, ancillary and laboratory) are listed below for reference.    Significant Diagnostic Studies: Dg Chest 2 View  Result Date: 11/06/2018 CLINICAL DATA:  Cough EXAM: CHEST - 2 VIEW COMPARISON:  09/02/2013, CT chest 04/08/2014 FINDINGS: Low lung volumes. Subsegmental atelectasis at the left base. No pleural effusion. Mild cardiomegaly. Moderate hiatal hernia. IMPRESSION: 1. Mild cardiomegaly with subsegmental atelectasis at the left base. 2. Moderate hiatal hernia Electronically Signed   By: Donavan Foil M.D.   On: 11/06/2018 19:55   Dg Cholangiogram Operative  Result Date: 11/08/2018 CLINICAL DATA:  Cholelithiasis EXAM: INTRAOPERATIVE CHOLANGIOGRAM TECHNIQUE: Cholangiographic images from the C-arm fluoroscopic device were submitted for interpretation post-operatively. Please see the procedural report for the amount of contrast and the fluoroscopy time utilized. COMPARISON:  None. FINDINGS: Contrast fills the biliary tree and duodenum without filling defects in the common bile duct. IMPRESSION: Patent biliary tree. Electronically Signed   By: Marybelle Killings M.D.   On: 11/08/2018 15:16   Nm Hepatobiliary Liver Func  Result Date: 11/07/2018 CLINICAL DATA:  RIGHT upper quadrant pain, fever, leukocytosis. Cholelithiasis and gallbladder wall thickening by ultrasound question acute cholecystitis, mildly thickened gallbladder wall by CT; bilirubin 3.4 EXAM: NUCLEAR MEDICINE HEPATOBILIARY IMAGING TECHNIQUE: Sequential images of the abdomen were obtained out to 60  minutes following intravenous administration of radiopharmaceutical. RADIOPHARMACEUTICALS:  7.5 mCi Tc-57m  Choletec IV COMPARISON:  Ultrasound abdomen 11/06/2018, CT abdomen and pelvis 11/06/2018 FINDINGS: Delayed clearance of tracer from bloodstream indicating hepatocellular dysfunction. Delayed excretion of tracer into biliary tree. Gallbladder did not visualized during the first hour of imaging. Small bowel visualized at approximately 100 minutes. Delayed imaging of the gallbladder over an additional lower also fails to demonstrate tracer within the gallbladder. Significant tracer retention is seen within the liver parenchyma at the conclusion of 2 hours of imaging. IMPRESSION: Nonvisualization of the gallbladder over 2 hours consistent with acute cholecystitis. Patent CBD. Prolonged retention of tracer within hepatic parenchyma consistent with hepatocellular dysfunction. Electronically Signed   By: Lavonia Dana M.D.   On: 11/07/2018 15:02   Ct Abdomen Pelvis W Contrast  Result Date: 11/06/2018 CLINICAL DATA:  84 year old male with abdominal distention. EXAM: CT ABDOMEN AND PELVIS WITH CONTRAST TECHNIQUE: Multidetector CT imaging of the abdomen and pelvis was performed using the standard protocol following bolus administration of intravenous contrast. CONTRAST:  159mL ISOVUE-300 IOPAMIDOL (ISOVUE-300) INJECTION 61% COMPARISON:  CT dated 08/30/2013 FINDINGS: Lower chest: There are bibasilar interstitial coarsening may represent chronic changes and possible superimposed edema. Clinical correlation is recommended. Trace bilateral pleural effusions noted. There is multi vessel coronary vascular calcification. No intra-abdominal free air or free fluid. Hepatobiliary: The liver is  unremarkable. No intrahepatic biliary ductal dilatation. There is mild gallbladder wall edema. No calcified stone. No pericholecystic fluid. Ultrasound may provide better evaluation of the gallbladder. Pancreas: Unremarkable. No  pancreatic ductal dilatation or surrounding inflammatory changes. Spleen: Normal in size without focal abnormality. Adrenals/Urinary Tract: The adrenal glands are unremarkable. There is no hydronephrosis on either side. There is symmetric enhancement and excretion of contrast by both kidneys. The visualized ureters and urinary bladder are unremarkable. Stomach/Bowel: There is extensive sigmoid and diffuse colonic diverticulosis without active inflammatory changes. There is a moderate size hiatal hernia. There is no bowel obstruction or active inflammation. Normal appendix. Vascular/Lymphatic: Moderate aortoiliac atherosclerotic disease. There is a 2.7 cm infrarenal aortic ectasia. The IVC is grossly unremarkable. No portal venous gas. Multiple small retroperitoneal lymph nodes. No adenopathy. Reproductive: Enlarged prostate gland measuring 5.3 cm in transverse axial diameter. The seminal vesicles are symmetric. Other: None Musculoskeletal: Osteopenia with multilevel degenerative changes of the spine and lower thoracic kyphosis. T12 lucent lesion appears similar to prior CT of 2014, likely benign. No acute osseous pathology. IMPRESSION: 1. Mild gallbladder wall edema. No calcified stone. Ultrasound may provide better evaluation of the gallbladder. 2. Extensive colonic diverticulosis without active inflammatory changes. No bowel obstruction or active inflammation. Normal appendix. 3. Moderate size hiatal hernia. 4. Bibasilar interstitial coarsening may represent chronic changes and possible superimposed edema. Clinical correlation is recommended. 5. Multi vessel coronary vascular calcification. Electronically Signed   By: Anner Crete M.D.   On: 11/06/2018 22:36   US Abdomen Limited Ruq  Result Date: 11/07/2018 CLINICAL DATA:  84 y/o  M; nausea and vomiting. EXAM: ULTRASOUND ABDOMEN LIMITED RIGHT UPPER QUADRANT COMPARISON:  11/06/2018 CT abdomen and pelvis. FINDINGS: Gallbladder: Multiple echogenic gallstones  with distal acoustic shadowing. Gallbladder wall thickening to 4 mm. Negative sonographic Murphy's sign. No pericholecystic fluid. Common bile duct: Diameter: 3 mm Liver: No focal lesion identified. Within normal limits in parenchymal echogenicity. Portal vein is patent on color Doppler imaging with normal direction of blood flow towards the liver. IMPRESSION: Cholelithiasis and gallbladder wall thickening may represent acute cholecystitis in the appropriate clinical setting. Electronically Signed   By: Kristine Garbe M.D.   On: 11/07/2018 00:06    Microbiology: Recent Results (from the past 240 hour(s))  Culture, blood (Routine x 2)     Status: None   Collection Time: 11/06/18  6:55 PM  Result Value Ref Range Status   Specimen Description   Final    BLOOD LEFT ARM Performed at [facility] [address], [place], [place] [address]    Special Requests   Final    BOTTLES DRAWN AEROBIC AND ANAEROBIC Blood Culture adequate volume Performed at [facility] [address], [place], [place] [address]    Culture   Final    NO GROWTH 5 DAYS Performed at [facility], [place] [address], [place], [place] [address]    Report Status 11/12/2018 FINAL  Final  Culture, blood (Routine x 2)     Status: None   Collection Time: 11/06/18  7:05 PM  Result Value Ref Range Status   Specimen Description   Final    BLOOD LEFT ANTECUBITAL Performed at [facility] [address] [place],  [address]    Special Requests   Final    BOTTLES DRAWN AEROBIC AND ANAEROBIC Blood Culture results may not be optimal due to an excessive volume of blood received in culture bottles Performed at [facility], [place] [address], [place], [place] [address]    Culture   Final    NO GROWTH 5 DAYS Performed at [facility], [place] [address] [place], [place] [address]    Report Status 11/12/2018 FINAL  Final      Labs: Basic Metabolic Panel: Recent Labs  Lab 11/06/18 1855 11/07/18 0332 11/08/18 0405 11/09/18 0424 11/10/18 0406  NA 142 143 141 138 140  K 4.3 4.3 4.1 4.7 4.4  CL 109 111 110 106 105  CO2 21* 25 23 20* 27  GLUCOSE 144* 112* 86 188* 109*  BUN 22 19 16 17 20   CREATININE 1.20 1.12 1.30* 1.28* 1.36*  CALCIUM 9.0 9.1 8.7* 9.0 8.9  MG  --   --  1.8  --   --    Liver Function Tests: Recent Labs  Lab 11/06/18 1855 11/07/18 0332 11/08/18 0405 11/09/18 0424 11/10/18 0406  AST 744* 538* 218* 139* 73*  ALT 421* 420* 247* 199* 138*  ALKPHOS 103 86 71 70 60  BILITOT 2.0* 3.4* 2.8* 1.6* 1.1  PROT 7.4 6.7 6.0* 6.3* 6.6  ALBUMIN 4.3 3.7 3.3* 3.5 3.6   Recent Labs  Lab 11/06/18 1855 11/07/18 0332  LIPASE 30 27   No results for input(s): AMMONIA in the last 168 hours. CBC: Recent Labs  Lab 11/06/18 1855 11/07/18 0332 11/08/18 0405 11/09/18 0424 11/10/18 0406  WBC 4.3 5.6 4.4 9.7 8.4  NEUTROABS 3.4  --   --   --   --   HGB 14.4 13.1 11.8* 13.7 13.1  HCT 45.4 40.5 36.8* 42.5 40.6  MCV 96.6 97.4 98.1 97.5 96.9  PLT 134* 135* 120* 148* 142*   Cardiac Enzymes: No results for input(s): CKTOTAL, CKMB, CKMBINDEX, TROPONINI in the last 168 hours. BNP: BNP (last 3 results) No results for input(s): BNP in the last 8760 hours.  ProBNP (last 3 results) No results for input(s): PROBNP in the last 8760 hours.  CBG: No results for input(s): GLUCAP in the last 168 hours.     SignedKinnie Feil  [facility] 11/13/2018, 9:54 AM

## 2018-11-13 NOTE — Plan of Care (Signed)
Pt alert and oriented, OOB to chair this am. Doing well, pain well controlled with tylenol.  Awaiting SNF placement. RN will monitor.

## 2018-11-14 DIAGNOSIS — I10 Essential (primary) hypertension: Secondary | ICD-10-CM | POA: Diagnosis not present

## 2018-11-14 DIAGNOSIS — K810 Acute cholecystitis: Secondary | ICD-10-CM | POA: Diagnosis not present

## 2018-11-14 DIAGNOSIS — E038 Other specified hypothyroidism: Secondary | ICD-10-CM | POA: Diagnosis not present

## 2018-11-14 DIAGNOSIS — C8590 Non-Hodgkin lymphoma, unspecified, unspecified site: Secondary | ICD-10-CM | POA: Diagnosis not present

## 2018-11-15 DIAGNOSIS — K810 Acute cholecystitis: Secondary | ICD-10-CM | POA: Diagnosis not present

## 2018-11-15 DIAGNOSIS — I87393 Chronic venous hypertension (idiopathic) with other complications of bilateral lower extremity: Secondary | ICD-10-CM | POA: Diagnosis not present

## 2018-11-15 DIAGNOSIS — I10 Essential (primary) hypertension: Secondary | ICD-10-CM | POA: Diagnosis not present

## 2018-11-15 DIAGNOSIS — R338 Other retention of urine: Secondary | ICD-10-CM | POA: Diagnosis not present

## 2018-11-19 DIAGNOSIS — R338 Other retention of urine: Secondary | ICD-10-CM | POA: Diagnosis not present

## 2018-11-19 DIAGNOSIS — E038 Other specified hypothyroidism: Secondary | ICD-10-CM | POA: Diagnosis not present

## 2018-11-19 DIAGNOSIS — K810 Acute cholecystitis: Secondary | ICD-10-CM | POA: Diagnosis not present

## 2018-11-19 DIAGNOSIS — I10 Essential (primary) hypertension: Secondary | ICD-10-CM | POA: Diagnosis not present

## 2018-11-20 DIAGNOSIS — E039 Hypothyroidism, unspecified: Secondary | ICD-10-CM | POA: Diagnosis not present

## 2018-11-20 DIAGNOSIS — I10 Essential (primary) hypertension: Secondary | ICD-10-CM | POA: Diagnosis not present

## 2018-11-20 DIAGNOSIS — I872 Venous insufficiency (chronic) (peripheral): Secondary | ICD-10-CM | POA: Diagnosis not present

## 2018-11-20 DIAGNOSIS — K810 Acute cholecystitis: Secondary | ICD-10-CM | POA: Diagnosis not present

## 2018-11-20 DIAGNOSIS — N183 Chronic kidney disease, stage 3 (moderate): Secondary | ICD-10-CM | POA: Diagnosis not present

## 2018-11-25 DIAGNOSIS — G4700 Insomnia, unspecified: Secondary | ICD-10-CM | POA: Diagnosis not present

## 2018-11-25 DIAGNOSIS — Z7982 Long term (current) use of aspirin: Secondary | ICD-10-CM | POA: Diagnosis not present

## 2018-11-25 DIAGNOSIS — I739 Peripheral vascular disease, unspecified: Secondary | ICD-10-CM | POA: Diagnosis not present

## 2018-11-25 DIAGNOSIS — Z48815 Encounter for surgical aftercare following surgery on the digestive system: Secondary | ICD-10-CM | POA: Diagnosis not present

## 2018-11-25 DIAGNOSIS — G894 Chronic pain syndrome: Secondary | ICD-10-CM | POA: Diagnosis not present

## 2018-11-25 DIAGNOSIS — Z8572 Personal history of non-Hodgkin lymphomas: Secondary | ICD-10-CM | POA: Diagnosis not present

## 2018-11-25 DIAGNOSIS — K219 Gastro-esophageal reflux disease without esophagitis: Secondary | ICD-10-CM | POA: Diagnosis not present

## 2018-11-25 DIAGNOSIS — Z9181 History of falling: Secondary | ICD-10-CM | POA: Diagnosis not present

## 2018-11-25 DIAGNOSIS — I129 Hypertensive chronic kidney disease with stage 1 through stage 4 chronic kidney disease, or unspecified chronic kidney disease: Secondary | ICD-10-CM | POA: Diagnosis not present

## 2018-11-25 DIAGNOSIS — E039 Hypothyroidism, unspecified: Secondary | ICD-10-CM | POA: Diagnosis not present

## 2018-11-25 DIAGNOSIS — N183 Chronic kidney disease, stage 3 (moderate): Secondary | ICD-10-CM | POA: Diagnosis not present

## 2018-11-27 DIAGNOSIS — Z8572 Personal history of non-Hodgkin lymphomas: Secondary | ICD-10-CM | POA: Diagnosis not present

## 2018-11-27 DIAGNOSIS — K219 Gastro-esophageal reflux disease without esophagitis: Secondary | ICD-10-CM | POA: Diagnosis not present

## 2018-11-27 DIAGNOSIS — I129 Hypertensive chronic kidney disease with stage 1 through stage 4 chronic kidney disease, or unspecified chronic kidney disease: Secondary | ICD-10-CM | POA: Diagnosis not present

## 2018-11-27 DIAGNOSIS — Z7982 Long term (current) use of aspirin: Secondary | ICD-10-CM | POA: Diagnosis not present

## 2018-11-27 DIAGNOSIS — Z9181 History of falling: Secondary | ICD-10-CM | POA: Diagnosis not present

## 2018-11-27 DIAGNOSIS — G894 Chronic pain syndrome: Secondary | ICD-10-CM | POA: Diagnosis not present

## 2018-11-27 DIAGNOSIS — N183 Chronic kidney disease, stage 3 (moderate): Secondary | ICD-10-CM | POA: Diagnosis not present

## 2018-11-27 DIAGNOSIS — Z48815 Encounter for surgical aftercare following surgery on the digestive system: Secondary | ICD-10-CM | POA: Diagnosis not present

## 2018-11-27 DIAGNOSIS — I739 Peripheral vascular disease, unspecified: Secondary | ICD-10-CM | POA: Diagnosis not present

## 2018-11-27 DIAGNOSIS — G4700 Insomnia, unspecified: Secondary | ICD-10-CM | POA: Diagnosis not present

## 2018-11-27 DIAGNOSIS — E039 Hypothyroidism, unspecified: Secondary | ICD-10-CM | POA: Diagnosis not present

## 2018-11-29 ENCOUNTER — Encounter: Payer: Self-pay | Admitting: Family Medicine

## 2018-11-29 ENCOUNTER — Ambulatory Visit (INDEPENDENT_AMBULATORY_CARE_PROVIDER_SITE_OTHER): Payer: Medicare Other | Admitting: Family Medicine

## 2018-11-29 VITALS — BP 146/90 | HR 72 | Temp 97.5°F | Resp 18 | Ht 73.0 in | Wt 206.0 lb

## 2018-11-29 DIAGNOSIS — G4700 Insomnia, unspecified: Secondary | ICD-10-CM | POA: Diagnosis not present

## 2018-11-29 DIAGNOSIS — I739 Peripheral vascular disease, unspecified: Secondary | ICD-10-CM | POA: Diagnosis not present

## 2018-11-29 DIAGNOSIS — Z09 Encounter for follow-up examination after completed treatment for conditions other than malignant neoplasm: Secondary | ICD-10-CM

## 2018-11-29 DIAGNOSIS — K219 Gastro-esophageal reflux disease without esophagitis: Secondary | ICD-10-CM | POA: Diagnosis not present

## 2018-11-29 DIAGNOSIS — I129 Hypertensive chronic kidney disease with stage 1 through stage 4 chronic kidney disease, or unspecified chronic kidney disease: Secondary | ICD-10-CM | POA: Diagnosis not present

## 2018-11-29 DIAGNOSIS — Z8572 Personal history of non-Hodgkin lymphomas: Secondary | ICD-10-CM | POA: Diagnosis not present

## 2018-11-29 DIAGNOSIS — N183 Chronic kidney disease, stage 3 (moderate): Secondary | ICD-10-CM | POA: Diagnosis not present

## 2018-11-29 DIAGNOSIS — I10 Essential (primary) hypertension: Secondary | ICD-10-CM | POA: Diagnosis not present

## 2018-11-29 DIAGNOSIS — Z7982 Long term (current) use of aspirin: Secondary | ICD-10-CM | POA: Diagnosis not present

## 2018-11-29 DIAGNOSIS — E039 Hypothyroidism, unspecified: Secondary | ICD-10-CM | POA: Diagnosis not present

## 2018-11-29 DIAGNOSIS — Z48815 Encounter for surgical aftercare following surgery on the digestive system: Secondary | ICD-10-CM | POA: Diagnosis not present

## 2018-11-29 DIAGNOSIS — G894 Chronic pain syndrome: Secondary | ICD-10-CM | POA: Diagnosis not present

## 2018-11-29 DIAGNOSIS — Z9181 History of falling: Secondary | ICD-10-CM | POA: Diagnosis not present

## 2018-11-29 MED ORDER — TAMSULOSIN HCL 0.4 MG PO CAPS: 0.4000 mg | ORAL_CAPSULE | Freq: Every day | ORAL | 1 refills | Status: DC

## 2018-11-29 NOTE — Progress Notes (Signed)
Subjective:    Patient ID: [name], male    DOB: [birth year], 85 y.o.   MRN: [id number]  HPI  Patient was recently admitted to the hospital.  I have copied relevant portions of the discharge summary and included them below for my reference:  Admit date: 11/06/2018 Discharge date: 11/13/2018  Time spent: >35 minutes  Recommendations for Outpatient Follow-up:  PCP in 3-7 days Surgery  in 10-14 day s  Discharge Diagnoses:  Principal Problem:   Cholecystitis, acute Active Problems:   Acid reflux   Hypertension   Gallstone   Hypothyroidism   CKD (chronic kidney disease) stage 3, GFR 30-59 ml/min (HCC)   Acute cholecystitis   Abnormal liver function   NHL (non-Hodgkin's lymphoma) ([place])   Discharge Condition: stable   Diet recommendation: regular       Filed Weights   11/06/18 1856 11/07/18 0431  Weight: 90.7 kg 97.9 kg    History of present illness:   85 year old with past medical history relevant for non-Hodgkin's lymphoma in remission, hypertension, hypothyroidism, chronic pain, reflux, stage II CKD admitted with fever and vague reports of abdominal pain and found to have possible acute cholecystitis on CT and right upper quadrant ultrasoundstatus post cholecystectomy on 11/08/2018.   Hospital Course:   Acute cholecystitis status post cholecystectomyon 11/08/2018: Patient was admitted with fever, abdominal pain, elevated LFTs. CT scan and right upper quadrant ultrasound were concerning for acute cholecystitis. Patient was started on IV Zosyn. HIDA scan confirmed acute cholecystitis. Patient was noted to have elevated LFTs and bilirubin. Patient was taken to the OR and had a cholecystectomy and intraoperative cholangiogram that showed no evidence of choledocholithiasis. Patient was advanced to regular diet. He was evaluated by physical therapy recommended skilled nursing facility. F/u with surgery in 2 weeks   Hypertension: resume home  regimen. Stable   Chronic venous stasis dermatitis: Patient's home furosemide was restarted prior to discharge.  Hypothyroidism: Patient is home levothyroxine was continued.  ChronicPain.Patient was continued on home gabapentin.  Stage II CKD: Stable  Procedures:  status post cholecystectomy (i.e. Studies not automatically included, echos, thoracentesis, etc; not x-rays)  11/29/18 Here today for follow-up.  Patient looks remarkably well.  He is here today with his daughter.  He has been discharged from rehab.  He denies any abdominal pain.  He states that he is moving his bowels normally.  He is had a few isolated episodes of diarrhea but nothing consistently.  He denies any nausea vomiting or fever.  There is no evidence of jaundice today on exam.  He denies any cough or shortness of breath or chest pain after his hospitalization.  He denies any pleurisy.  He has a history of chronic venous insufficiency but his legs look better today than they have looked previously.  There is no swelling.  He is wearing his compression hose.  He has a negative Homans sign bilaterally.  He did require in and out catheterizations in the nursing facility/rehab facility after they discontinued his Flomax due to urinary retention.  However he denies any dysuria.  He is urinating normally without any evidence of urinary retention.  He is back on the Flomax.  However he is having more profound dizziness and difficulty standing.  Physical therapy has yet to start at home.  They were supposed to be coming today.  He is on numerous medications that can contribute to dizziness particulate orthostatic dizziness including his blood pressure pills, Flomax, and gabapentin. Past Medical History:  Diagnosis  Date  . Acid reflux   . Cancer ([place]) 09/05/13   left neck-non-hodgkins lymphoma  . Hypertension    requires diuretic  . Hypothyroidism   . Malaria    while in the service  . Peripheral vascular disease ([place])     slow wound healing on legs   Past Surgical History:  Procedure Laterality Date  . AMPUTATION Left 06/25/2015   Procedure: LEFT  HALLUX AMPUTATION,;  Surgeon: Wylene Simmer, MD;  Location: [place];  Service: Orthopedics;  Laterality: Left;  LOCAL/MAC  . CHOLECYSTECTOMY N/A 11/08/2018   Procedure: LAPAROSCOPIC CHOLECYSTECTOMY WITH INTRAOPERATIVE CHOLANGIOGRAM;  Surgeon: [name], MD;  Location: [facility];  Service: General;  Laterality: N/A;  . MASS BIOPSY Left 09/05/2013   Procedure: EXCISIONAL BIOPSY OF LEFT NECK MASS;  Surgeon: Jerrell Belfast, MD;  Location: [place];  Service: ENT;  Laterality: Left;  . TONSILLECTOMY    . YAG LASER APPLICATION Right 1/94/1740   Procedure: YAG LASER APPLICATION;  Surgeon: [name], MD;  Location: [facility];  Service: Ophthalmology;  Laterality: Right;   Current Outpatient Medications on File Prior to Visit  Medication Sig Dispense Refill  . amLODipine (NORVASC) 5 MG tablet Take 1 tablet (5 mg total) by mouth daily. 90 tablet 3  . aspirin EC 81 MG tablet Take 1 tablet (81 mg total) by mouth daily.    . enalapril (VASOTEC) 20 MG tablet TAKE 1 TABLET BY MOUTH  EVERY DAY 90 tablet 3  . furosemide (LASIX) 20 MG tablet TAKE 2 TABLETS BY MOUTH  EVERY DAY 180 tablet 3  . gabapentin (NEURONTIN) 100 MG capsule TAKE 2 CAPSULES EVERY  MORNING 180 capsule 4  . gabapentin (NEURONTIN) 300 MG capsule TAKE 1 CAPSULE BY MOUTH AT  BEDTIME 90 capsule 4  . levothyroxine (SYNTHROID, LEVOTHROID) 112 MCG tablet Take 1 tablet (112 mcg total) by mouth daily. 90 tablet 3  . vitamin B-12 (CYANOCOBALAMIN) 1000 MCG tablet Take 1 tablet (1,000 mcg total) by mouth daily. 30 tablet 5  . [DISCONTINUED] famotidine (PEPCID) 20 MG tablet Take 20 mg by mouth 2 (two) times daily.     No current facility-administered medications on file prior to visit.    Allergies  Allergen Reactions  . Codeine Nausea And Vomiting  . Hydrocodone Nausea And Vomiting  . Percocet  [Oxycodone-Acetaminophen] Nausea And Vomiting   Social History   Socioeconomic History  . Marital status: Married    Spouse name: Not on file  . Number of children: 4  . Years of education: Not on file  . Highest education level: Not on file  Occupational History  . Occupation: retired  Scientific laboratory technician  . Financial resource strain: Not on file  . Food insecurity:    Worry: Not on file    Inability: Not on file  . Transportation needs:    Medical: Not on file    Non-medical: Not on file  Tobacco Use  . Smoking status: Former Smoker    Types: Cigars  . Smokeless tobacco: Former Systems developer    Quit date: 10/05/1979  Substance and Sexual Activity  . Alcohol use: No  . Drug use: No  . Sexual activity: Not on file  Lifestyle  . Physical activity:    Days per week: Not on file    Minutes per session: Not on file  . Stress: Not on file  Relationships  . Social connections:    Talks on phone: Not on file    Gets together: Not on  file    Attends religious service: Not on file    Active member of club or organization: Not on file    Attends meetings of clubs or organizations: Not on file    Relationship status: Not on file  . Intimate partner violence:    Fear of current or ex partner: Not on file    Emotionally abused: Not on file    Physically abused: Not on file    Forced sexual activity: Not on file  Other Topics Concern  . Not on file  Social History Narrative  . Not on file     Review of Systems  All other systems reviewed and are negative.      Objective:   Physical Exam Vitals signs reviewed.  Constitutional:      General: He is not in acute distress.    Appearance: Normal appearance. He is not ill-appearing, toxic-appearing or diaphoretic.  Cardiovascular:     Rate and Rhythm: Normal rate and regular rhythm.     Pulses: Normal pulses.     Heart sounds: No murmur.  Pulmonary:     Effort: Pulmonary effort is normal. No respiratory distress.     Breath sounds:  Normal breath sounds. No stridor. No wheezing, rhonchi or rales.  Chest:     Chest wall: No tenderness.  Abdominal:     General: Bowel sounds are normal. There is no distension.     Palpations: Abdomen is soft.     Tenderness: There is no abdominal tenderness. There is no guarding.  Musculoskeletal:     Right lower leg: No edema.     Left lower leg: No edema.  Lymphadenopathy:     Cervical: No cervical adenopathy.  Neurological:     Mental Status: He is alert.    Laparoscopic port sites appear healed without any evidence of secondary cellulitis.  There is no erythema on the abdomen, there is no warmth or tenderness to palpation       Assessment & Plan:  Hospital discharge follow-up - Plan: CBC with Differential/Platelet, COMPLETE METABOLIC PANEL WITH GFR  Patient has done extremely well.  I have recommended physical therapy to try to improve his strength, stamina, and balance.  I also recommended changing some of his medication to see if it will help avoid dizziness.  Therefore I recommended decreasing his gabapentin to 100 mg twice daily.  If there is no substantial increase in his pain, we may even try discontinuing it.  I would also continue the Flomax for 1 additional week and then have a voiding trial where he discontinues the Flomax.  I encouraged the patient's daughter to check closely daily to make sure he is not having any evidence of urinary retention.  As long as he does not have any residual urinary retention after the discontinuation of the Flomax he could come off that medication which may help some with orthostatic dizziness.  I will monitor a CBC and a CMP today.

## 2018-11-30 LAB — CBC WITH DIFFERENTIAL/PLATELET
Absolute Monocytes: 495 cells/uL (ref 200–950)
Basophils Absolute: 39 cells/uL (ref 0–200)
Basophils Relative: 0.8 %
Eosinophils Absolute: 103 cells/uL (ref 15–500)
Eosinophils Relative: 2.1 %
HCT: 40.3 % (ref 38.5–50.0)
Hemoglobin: 13.7 g/dL (ref 13.2–17.1)
Lymphs Abs: 1495 cells/uL (ref 850–3900)
MCH: 31.7 pg (ref 27.0–33.0)
MCHC: 34 g/dL (ref 32.0–36.0)
MCV: 93.3 fL (ref 80.0–100.0)
MPV: 10.6 fL (ref 7.5–12.5)
Monocytes Relative: 10.1 %
Neutro Abs: 2769 cells/uL (ref 1500–7800)
Neutrophils Relative %: 56.5 %
Platelets: 205 10*3/uL (ref 140–400)
RBC: 4.32 10*6/uL (ref 4.20–5.80)
RDW: 12.8 % (ref 11.0–15.0)
Total Lymphocyte: 30.5 %
WBC: 4.9 10*3/uL (ref 3.8–10.8)

## 2018-11-30 LAB — COMPLETE METABOLIC PANEL WITH GFR
AG Ratio: 1.6 (calc) (ref 1.0–2.5)
ALT: 14 U/L (ref 9–46)
AST: 20 U/L (ref 10–35)
Albumin: 4.5 g/dL (ref 3.6–5.1)
Alkaline phosphatase (APISO): 65 U/L (ref 40–115)
BUN/Creatinine Ratio: 14 (calc) (ref 6–22)
BUN: 20 mg/dL (ref 7–25)
CO2: 28 mmol/L (ref 20–32)
Calcium: 10.1 mg/dL (ref 8.6–10.3)
Chloride: 108 mmol/L (ref 98–110)
Creat: 1.45 mg/dL — ABNORMAL HIGH (ref 0.70–1.11)
GFR, Est African American: 46 mL/min/{1.73_m2} — ABNORMAL LOW (ref >=60)
GFR, Est Non African American: 40 mL/min/{1.73_m2} — ABNORMAL LOW (ref >=60)
Globulin: 2.9 g/dL (calc) (ref 1.9–3.7)
Glucose, Bld: 109 mg/dL — ABNORMAL HIGH (ref 65–99)
Potassium: 4.9 mmol/L (ref 3.5–5.3)
Sodium: 145 mmol/L (ref 135–146)
Total Bilirubin: 0.6 mg/dL (ref 0.2–1.2)
Total Protein: 7.4 g/dL (ref 6.1–8.1)

## 2018-12-04 DIAGNOSIS — Z9181 History of falling: Secondary | ICD-10-CM | POA: Diagnosis not present

## 2018-12-04 DIAGNOSIS — Z48815 Encounter for surgical aftercare following surgery on the digestive system: Secondary | ICD-10-CM | POA: Diagnosis not present

## 2018-12-04 DIAGNOSIS — Z8572 Personal history of non-Hodgkin lymphomas: Secondary | ICD-10-CM | POA: Diagnosis not present

## 2018-12-04 DIAGNOSIS — I129 Hypertensive chronic kidney disease with stage 1 through stage 4 chronic kidney disease, or unspecified chronic kidney disease: Secondary | ICD-10-CM | POA: Diagnosis not present

## 2018-12-04 DIAGNOSIS — I739 Peripheral vascular disease, unspecified: Secondary | ICD-10-CM | POA: Diagnosis not present

## 2018-12-04 DIAGNOSIS — N183 Chronic kidney disease, stage 3 (moderate): Secondary | ICD-10-CM | POA: Diagnosis not present

## 2018-12-04 DIAGNOSIS — Z7982 Long term (current) use of aspirin: Secondary | ICD-10-CM | POA: Diagnosis not present

## 2018-12-04 DIAGNOSIS — K219 Gastro-esophageal reflux disease without esophagitis: Secondary | ICD-10-CM | POA: Diagnosis not present

## 2018-12-04 DIAGNOSIS — G4700 Insomnia, unspecified: Secondary | ICD-10-CM | POA: Diagnosis not present

## 2018-12-04 DIAGNOSIS — E039 Hypothyroidism, unspecified: Secondary | ICD-10-CM | POA: Diagnosis not present

## 2018-12-04 DIAGNOSIS — G894 Chronic pain syndrome: Secondary | ICD-10-CM | POA: Diagnosis not present

## 2018-12-06 DIAGNOSIS — Z48815 Encounter for surgical aftercare following surgery on the digestive system: Secondary | ICD-10-CM | POA: Diagnosis not present

## 2018-12-06 DIAGNOSIS — K219 Gastro-esophageal reflux disease without esophagitis: Secondary | ICD-10-CM | POA: Diagnosis not present

## 2018-12-06 DIAGNOSIS — N183 Chronic kidney disease, stage 3 (moderate): Secondary | ICD-10-CM | POA: Diagnosis not present

## 2018-12-06 DIAGNOSIS — I129 Hypertensive chronic kidney disease with stage 1 through stage 4 chronic kidney disease, or unspecified chronic kidney disease: Secondary | ICD-10-CM | POA: Diagnosis not present

## 2018-12-06 DIAGNOSIS — G4700 Insomnia, unspecified: Secondary | ICD-10-CM | POA: Diagnosis not present

## 2018-12-06 DIAGNOSIS — Z8572 Personal history of non-Hodgkin lymphomas: Secondary | ICD-10-CM | POA: Diagnosis not present

## 2018-12-06 DIAGNOSIS — Z7982 Long term (current) use of aspirin: Secondary | ICD-10-CM | POA: Diagnosis not present

## 2018-12-06 DIAGNOSIS — E039 Hypothyroidism, unspecified: Secondary | ICD-10-CM | POA: Diagnosis not present

## 2018-12-06 DIAGNOSIS — I739 Peripheral vascular disease, unspecified: Secondary | ICD-10-CM | POA: Diagnosis not present

## 2018-12-06 DIAGNOSIS — G894 Chronic pain syndrome: Secondary | ICD-10-CM | POA: Diagnosis not present

## 2018-12-06 DIAGNOSIS — Z9181 History of falling: Secondary | ICD-10-CM | POA: Diagnosis not present

## 2018-12-10 DIAGNOSIS — I129 Hypertensive chronic kidney disease with stage 1 through stage 4 chronic kidney disease, or unspecified chronic kidney disease: Secondary | ICD-10-CM | POA: Diagnosis not present

## 2018-12-10 DIAGNOSIS — E039 Hypothyroidism, unspecified: Secondary | ICD-10-CM | POA: Diagnosis not present

## 2018-12-10 DIAGNOSIS — Z7982 Long term (current) use of aspirin: Secondary | ICD-10-CM | POA: Diagnosis not present

## 2018-12-10 DIAGNOSIS — G4700 Insomnia, unspecified: Secondary | ICD-10-CM | POA: Diagnosis not present

## 2018-12-10 DIAGNOSIS — I739 Peripheral vascular disease, unspecified: Secondary | ICD-10-CM | POA: Diagnosis not present

## 2018-12-10 DIAGNOSIS — Z48815 Encounter for surgical aftercare following surgery on the digestive system: Secondary | ICD-10-CM | POA: Diagnosis not present

## 2018-12-10 DIAGNOSIS — Z8572 Personal history of non-Hodgkin lymphomas: Secondary | ICD-10-CM | POA: Diagnosis not present

## 2018-12-10 DIAGNOSIS — Z9181 History of falling: Secondary | ICD-10-CM | POA: Diagnosis not present

## 2018-12-10 DIAGNOSIS — N183 Chronic kidney disease, stage 3 (moderate): Secondary | ICD-10-CM | POA: Diagnosis not present

## 2018-12-10 DIAGNOSIS — G894 Chronic pain syndrome: Secondary | ICD-10-CM | POA: Diagnosis not present

## 2018-12-10 DIAGNOSIS — K219 Gastro-esophageal reflux disease without esophagitis: Secondary | ICD-10-CM | POA: Diagnosis not present

## 2018-12-11 DIAGNOSIS — G894 Chronic pain syndrome: Secondary | ICD-10-CM | POA: Diagnosis not present

## 2018-12-11 DIAGNOSIS — Z9181 History of falling: Secondary | ICD-10-CM | POA: Diagnosis not present

## 2018-12-11 DIAGNOSIS — Z8572 Personal history of non-Hodgkin lymphomas: Secondary | ICD-10-CM | POA: Diagnosis not present

## 2018-12-11 DIAGNOSIS — K219 Gastro-esophageal reflux disease without esophagitis: Secondary | ICD-10-CM | POA: Diagnosis not present

## 2018-12-11 DIAGNOSIS — I129 Hypertensive chronic kidney disease with stage 1 through stage 4 chronic kidney disease, or unspecified chronic kidney disease: Secondary | ICD-10-CM | POA: Diagnosis not present

## 2018-12-11 DIAGNOSIS — I739 Peripheral vascular disease, unspecified: Secondary | ICD-10-CM | POA: Diagnosis not present

## 2018-12-11 DIAGNOSIS — N183 Chronic kidney disease, stage 3 (moderate): Secondary | ICD-10-CM | POA: Diagnosis not present

## 2018-12-11 DIAGNOSIS — Z7982 Long term (current) use of aspirin: Secondary | ICD-10-CM | POA: Diagnosis not present

## 2018-12-11 DIAGNOSIS — G4700 Insomnia, unspecified: Secondary | ICD-10-CM | POA: Diagnosis not present

## 2018-12-11 DIAGNOSIS — E039 Hypothyroidism, unspecified: Secondary | ICD-10-CM | POA: Diagnosis not present

## 2018-12-11 DIAGNOSIS — Z48815 Encounter for surgical aftercare following surgery on the digestive system: Secondary | ICD-10-CM | POA: Diagnosis not present

## 2018-12-13 DIAGNOSIS — K219 Gastro-esophageal reflux disease without esophagitis: Secondary | ICD-10-CM | POA: Diagnosis not present

## 2018-12-13 DIAGNOSIS — E039 Hypothyroidism, unspecified: Secondary | ICD-10-CM | POA: Diagnosis not present

## 2018-12-13 DIAGNOSIS — G894 Chronic pain syndrome: Secondary | ICD-10-CM | POA: Diagnosis not present

## 2018-12-13 DIAGNOSIS — G4700 Insomnia, unspecified: Secondary | ICD-10-CM | POA: Diagnosis not present

## 2018-12-13 DIAGNOSIS — N183 Chronic kidney disease, stage 3 (moderate): Secondary | ICD-10-CM | POA: Diagnosis not present

## 2018-12-13 DIAGNOSIS — Z48815 Encounter for surgical aftercare following surgery on the digestive system: Secondary | ICD-10-CM | POA: Diagnosis not present

## 2018-12-13 DIAGNOSIS — Z9181 History of falling: Secondary | ICD-10-CM | POA: Diagnosis not present

## 2018-12-13 DIAGNOSIS — Z8572 Personal history of non-Hodgkin lymphomas: Secondary | ICD-10-CM | POA: Diagnosis not present

## 2018-12-13 DIAGNOSIS — I739 Peripheral vascular disease, unspecified: Secondary | ICD-10-CM | POA: Diagnosis not present

## 2018-12-13 DIAGNOSIS — Z7982 Long term (current) use of aspirin: Secondary | ICD-10-CM | POA: Diagnosis not present

## 2018-12-13 DIAGNOSIS — I129 Hypertensive chronic kidney disease with stage 1 through stage 4 chronic kidney disease, or unspecified chronic kidney disease: Secondary | ICD-10-CM | POA: Diagnosis not present

## 2018-12-14 ENCOUNTER — Other Ambulatory Visit: Payer: Self-pay | Admitting: Podiatry

## 2018-12-17 DIAGNOSIS — I129 Hypertensive chronic kidney disease with stage 1 through stage 4 chronic kidney disease, or unspecified chronic kidney disease: Secondary | ICD-10-CM | POA: Diagnosis not present

## 2018-12-17 DIAGNOSIS — N183 Chronic kidney disease, stage 3 (moderate): Secondary | ICD-10-CM | POA: Diagnosis not present

## 2018-12-17 DIAGNOSIS — K219 Gastro-esophageal reflux disease without esophagitis: Secondary | ICD-10-CM | POA: Diagnosis not present

## 2018-12-17 DIAGNOSIS — G4700 Insomnia, unspecified: Secondary | ICD-10-CM | POA: Diagnosis not present

## 2018-12-17 DIAGNOSIS — E039 Hypothyroidism, unspecified: Secondary | ICD-10-CM | POA: Diagnosis not present

## 2018-12-17 DIAGNOSIS — Z9181 History of falling: Secondary | ICD-10-CM | POA: Diagnosis not present

## 2018-12-17 DIAGNOSIS — Z7982 Long term (current) use of aspirin: Secondary | ICD-10-CM | POA: Diagnosis not present

## 2018-12-17 DIAGNOSIS — Z8572 Personal history of non-Hodgkin lymphomas: Secondary | ICD-10-CM | POA: Diagnosis not present

## 2018-12-17 DIAGNOSIS — G894 Chronic pain syndrome: Secondary | ICD-10-CM | POA: Diagnosis not present

## 2018-12-17 DIAGNOSIS — Z48815 Encounter for surgical aftercare following surgery on the digestive system: Secondary | ICD-10-CM | POA: Diagnosis not present

## 2018-12-17 DIAGNOSIS — I739 Peripheral vascular disease, unspecified: Secondary | ICD-10-CM | POA: Diagnosis not present

## 2018-12-20 DIAGNOSIS — I739 Peripheral vascular disease, unspecified: Secondary | ICD-10-CM | POA: Diagnosis not present

## 2018-12-20 DIAGNOSIS — G894 Chronic pain syndrome: Secondary | ICD-10-CM | POA: Diagnosis not present

## 2018-12-20 DIAGNOSIS — Z7982 Long term (current) use of aspirin: Secondary | ICD-10-CM | POA: Diagnosis not present

## 2018-12-20 DIAGNOSIS — N183 Chronic kidney disease, stage 3 (moderate): Secondary | ICD-10-CM | POA: Diagnosis not present

## 2018-12-20 DIAGNOSIS — G4700 Insomnia, unspecified: Secondary | ICD-10-CM | POA: Diagnosis not present

## 2018-12-20 DIAGNOSIS — Z48815 Encounter for surgical aftercare following surgery on the digestive system: Secondary | ICD-10-CM | POA: Diagnosis not present

## 2018-12-20 DIAGNOSIS — K219 Gastro-esophageal reflux disease without esophagitis: Secondary | ICD-10-CM | POA: Diagnosis not present

## 2018-12-20 DIAGNOSIS — E039 Hypothyroidism, unspecified: Secondary | ICD-10-CM | POA: Diagnosis not present

## 2018-12-20 DIAGNOSIS — Z8572 Personal history of non-Hodgkin lymphomas: Secondary | ICD-10-CM | POA: Diagnosis not present

## 2018-12-20 DIAGNOSIS — Z9181 History of falling: Secondary | ICD-10-CM | POA: Diagnosis not present

## 2018-12-20 DIAGNOSIS — I129 Hypertensive chronic kidney disease with stage 1 through stage 4 chronic kidney disease, or unspecified chronic kidney disease: Secondary | ICD-10-CM | POA: Diagnosis not present

## 2018-12-28 ENCOUNTER — Other Ambulatory Visit: Payer: Self-pay | Admitting: Family Medicine

## 2019-01-05 ENCOUNTER — Other Ambulatory Visit: Payer: Self-pay | Admitting: Family Medicine

## 2019-01-07 MED ORDER — TAMSULOSIN HCL 0.4 MG PO CAPS: 0.4000 mg | ORAL_CAPSULE | Freq: Every day | ORAL | 1 refills | Status: DC

## 2019-01-07 NOTE — Addendum Note (Signed)
Addended by: Sheral Flow on: 01/07/2019 09:11 AM   Modules accepted: Orders

## 2019-01-17 ENCOUNTER — Telehealth: Payer: Self-pay | Admitting: Family Medicine

## 2019-01-17 NOTE — Telephone Encounter (Signed)
[facility]  Patient needs refill on hydrocodone

## 2019-01-17 NOTE — Telephone Encounter (Signed)
Do not see on list and has not been refilled since 07/2018 - OK to refill?

## 2019-01-18 MED ORDER — HYDROCODONE-ACETAMINOPHEN 5-325 MG PO TABS: 1.0000 | ORAL_TABLET | Freq: Four times a day (QID) | ORAL | 0 refills | Status: DC | PRN

## 2019-01-18 NOTE — Telephone Encounter (Signed)
[facility]. He uses it sparingly for neuropathy

## 2019-02-05 ENCOUNTER — Encounter: Payer: Self-pay | Admitting: Family Medicine

## 2019-02-05 ENCOUNTER — Ambulatory Visit (INDEPENDENT_AMBULATORY_CARE_PROVIDER_SITE_OTHER): Payer: Medicare Other | Admitting: Family Medicine

## 2019-02-05 VITALS — BP 138/78 | HR 73 | Temp 97.9°F | Resp 16 | Ht 73.0 in

## 2019-02-05 DIAGNOSIS — H60331 Swimmer's ear, right ear: Secondary | ICD-10-CM | POA: Diagnosis not present

## 2019-02-05 DIAGNOSIS — J069 Acute upper respiratory infection, unspecified: Secondary | ICD-10-CM | POA: Diagnosis not present

## 2019-02-05 MED ORDER — AZITHROMYCIN 250 MG PO TABS: ORAL_TABLET | ORAL | 0 refills | Status: DC

## 2019-02-05 MED ORDER — BENZONATATE 100 MG PO CAPS: 100.0000 mg | ORAL_CAPSULE | Freq: Two times a day (BID) | ORAL | 0 refills | Status: DC | PRN

## 2019-02-05 MED ORDER — NEOMYCIN-COLIST-HC-THONZONIUM 3.3-3-10-0.5 MG/ML OT SUSP: 4.0000 [drp] | Freq: Four times a day (QID) | OTIC | 0 refills | Status: DC

## 2019-02-05 NOTE — Progress Notes (Signed)
Subjective:    Patient ID: [name], male    DOB: 08/10/1919, 85 y.o.   MRN: [id number]  HPI Patient is here today with a one-week history of head congestion, rhinorrhea, and nonproductive cough.  He denies any fever.  He denies any chills.  He denies any shortness of breath.  He has been using Coricidin over-the-counter with minimal relief.  Recently use some of his wife's Tessalon which helped his cough immensely and he would like some Tessalon of his own.  He denies any purulent sputum.  He denies any pleurisy or chest pain.  There is no respiratory distress.  He does complain of itching in his right ear.  On examination there is exudate and drainage in his right auditory canal.  This is consistent with otitis externa.  However the tympanic membrane is pearly gray with no erythema Past Medical History:  Diagnosis Date  . Acid reflux   . Cancer ([place]) 09/05/13   left neck-non-hodgkins lymphoma  . Hypertension    requires diuretic  . Hypothyroidism   . Malaria    while in the service  . Peripheral vascular disease ([place])    slow wound healing on legs   Past Surgical History:  Procedure Laterality Date  . AMPUTATION Left 06/25/2015   Procedure: LEFT  HALLUX AMPUTATION,;  Surgeon: Wylene Simmer, MD;  Location: [place];  Service: Orthopedics;  Laterality: Left;  LOCAL/MAC  . CHOLECYSTECTOMY N/A 11/08/2018   Procedure: LAPAROSCOPIC CHOLECYSTECTOMY WITH INTRAOPERATIVE CHOLANGIOGRAM;  Surgeon: [name], MD;  [facility];  Service: General;  Laterality: N/A;  . MASS BIOPSY Left 09/05/2013   Procedure: EXCISIONAL BIOPSY OF LEFT NECK MASS;  Surgeon: Jerrell Belfast, MD;  Location: [place];  Service: ENT;  Laterality: Left;  . TONSILLECTOMY    . YAG LASER APPLICATION Right 8/67/6195   Procedure: YAG LASER APPLICATION;  Surgeon: [name], MD;  [facility];  Service: Ophthalmology;  Laterality: Right;   Current Outpatient Medications on File Prior to Visit    Medication Sig Dispense Refill  . amLODipine (NORVASC) 5 MG tablet Take 1 tablet (5 mg total) by mouth daily. 90 tablet 3  . aspirin EC 81 MG tablet Take 1 tablet (81 mg total) by mouth daily.    . enalapril (VASOTEC) 20 MG tablet TAKE 1 TABLET BY MOUTH  EVERY DAY 90 tablet 3  . furosemide (LASIX) 20 MG tablet TAKE 2 TABLETS BY MOUTH  EVERY DAY 180 tablet 3  . gabapentin (NEURONTIN) 100 MG capsule TAKE 2 CAPSULES BY MOUTH  EVERY MORNING 180 capsule 4  . gabapentin (NEURONTIN) 300 MG capsule TAKE 1 CAPSULE BY MOUTH AT  BEDTIME 90 capsule 4  . HYDROcodone-acetaminophen (NORCO) 5-325 MG tablet Take 1 tablet by mouth every 6 (six) hours as needed for moderate pain. 30 tablet 0  . levothyroxine (SYNTHROID, LEVOTHROID) 112 MCG tablet Take 1 tablet (112 mcg total) by mouth daily. 90 tablet 3  . tamsulosin (FLOMAX) 0.4 MG CAPS capsule Take 1 capsule (0.4 mg total) by mouth daily. 90 capsule 1  . vitamin B-12 (CYANOCOBALAMIN) 1000 MCG tablet Take 1 tablet (1,000 mcg total) by mouth daily. 30 tablet 5  . [DISCONTINUED] famotidine (PEPCID) 20 MG tablet Take 20 mg by mouth 2 (two) times daily.     No current facility-administered medications on file prior to visit.    Allergies  Allergen Reactions  . Codeine Nausea And Vomiting  . Hydrocodone Nausea And Vomiting  . Percocet [Oxycodone-Acetaminophen]  Nausea And Vomiting   Social History   Socioeconomic History  . Marital status: Married    Spouse name: Not on file  . Number of children: 4  . Years of education: Not on file  . Highest education level: Not on file  Occupational History  . Occupation: retired  Scientific laboratory technician  . Financial resource strain: Not on file  . Food insecurity:    Worry: Not on file    Inability: Not on file  . Transportation needs:    Medical: Not on file    Non-medical: Not on file  Tobacco Use  . Smoking status: Former Smoker    Types: Cigars  . Smokeless tobacco: Former Systems developer    Quit date: 10/05/1979  Substance  and Sexual Activity  . Alcohol use: No  . Drug use: No  . Sexual activity: Not on file  Lifestyle  . Physical activity:    Days per week: Not on file    Minutes per session: Not on file  . Stress: Not on file  Relationships  . Social connections:    Talks on phone: Not on file    Gets together: Not on file    Attends religious service: Not on file    Active member of club or organization: Not on file    Attends meetings of clubs or organizations: Not on file    Relationship status: Not on file  . Intimate partner violence:    Fear of current or ex partner: Not on file    Emotionally abused: Not on file    Physically abused: Not on file    Forced sexual activity: Not on file  Other Topics Concern  . Not on file  Social History Narrative  . Not on file      Review of Systems  All other systems reviewed and are negative.      Objective:   Physical Exam  Constitutional: He appears well-developed and well-nourished. No distress.  HENT:  Right Ear: External ear normal.  Left Ear: External ear normal.  Nose: Mucosal edema and rhinorrhea present.  Mouth/Throat: Oropharynx is clear and moist. No oropharyngeal exudate.  Cardiovascular: Normal rate, regular rhythm and normal heart sounds. Exam reveals no gallop and no friction rub.  No murmur heard. Pulmonary/Chest: Effort normal and breath sounds normal. No respiratory distress. He has no wheezes. He has no rales. He exhibits no tenderness.  Abdominal: Soft. Bowel sounds are normal. He exhibits no distension. There is no abdominal tenderness. There is no rebound and no guarding.  Musculoskeletal:        General: Edema present.  Skin: He is not diaphoretic.  Vitals reviewed.         Assessment & Plan:  URI, acute  Acute swimmer's ear of right side  Patient has a viral upper respiratory infection.  I recommended Coricidin HBP for head congestion and cough.  For severe cough he can use Tessalon 100 mg every 8 hours as  needed for severe cough.  I will treat the swimmer's ear with Cortisporin HC otic 4 drops in the right ear 4 times a day for 1 week.  If symptoms worsen, I would have a low threshold for starting the patient on antibiotics given his advanced age

## 2019-02-10 ENCOUNTER — Other Ambulatory Visit: Payer: Self-pay | Admitting: Family Medicine

## 2019-02-10 DIAGNOSIS — R509 Fever, unspecified: Secondary | ICD-10-CM

## 2019-02-11 ENCOUNTER — Telehealth: Payer: Self-pay | Admitting: Family Medicine

## 2019-02-11 NOTE — Telephone Encounter (Signed)
Do not refill levaquin for pt he does not need it.

## 2019-02-12 NOTE — Telephone Encounter (Signed)
noted 

## 2019-03-27 ENCOUNTER — Other Ambulatory Visit: Payer: Self-pay | Admitting: Family Medicine

## 2019-04-13 ENCOUNTER — Other Ambulatory Visit: Payer: Self-pay | Admitting: Family Medicine

## 2019-04-13 DIAGNOSIS — J189 Pneumonia, unspecified organism: Secondary | ICD-10-CM

## 2019-05-26 ENCOUNTER — Other Ambulatory Visit: Payer: Self-pay | Admitting: Family Medicine

## 2019-06-06 ENCOUNTER — Ambulatory Visit: Payer: Medicare Other

## 2019-06-06 ENCOUNTER — Ambulatory Visit: Payer: Medicare Other | Admitting: Podiatry

## 2019-06-06 ENCOUNTER — Encounter: Payer: Self-pay | Admitting: Podiatry

## 2019-06-06 ENCOUNTER — Other Ambulatory Visit: Payer: Self-pay

## 2019-06-06 VITALS — Temp 98.1°F

## 2019-06-06 DIAGNOSIS — G5762 Lesion of plantar nerve, left lower limb: Secondary | ICD-10-CM | POA: Diagnosis not present

## 2019-06-06 DIAGNOSIS — M79672 Pain in left foot: Secondary | ICD-10-CM

## 2019-06-17 DIAGNOSIS — H35433 Paving stone degeneration of retina, bilateral: Secondary | ICD-10-CM | POA: Diagnosis not present

## 2019-06-17 DIAGNOSIS — H43813 Vitreous degeneration, bilateral: Secondary | ICD-10-CM | POA: Diagnosis not present

## 2019-06-17 DIAGNOSIS — H43391 Other vitreous opacities, right eye: Secondary | ICD-10-CM | POA: Diagnosis not present

## 2019-06-17 DIAGNOSIS — H353233 Exudative age-related macular degeneration, bilateral, with inactive scar: Secondary | ICD-10-CM | POA: Diagnosis not present

## 2019-06-20 ENCOUNTER — Other Ambulatory Visit: Payer: Self-pay

## 2019-06-20 ENCOUNTER — Ambulatory Visit: Payer: Medicare Other | Admitting: Podiatry

## 2019-06-20 VITALS — Temp 97.7°F

## 2019-06-20 DIAGNOSIS — G5762 Lesion of plantar nerve, left lower limb: Secondary | ICD-10-CM

## 2019-06-20 NOTE — Progress Notes (Signed)
Subjective:  Patient ID: [name], male    DOB: 05/27/1919,  MRN: [id number]  Chief Complaint  Patient presents with  . Nail Problem    pt is experiencing left toenail pain in toes 2-5, pt states that it has been going on for several years, pt has tried clipping them but not much has been helping    85 y.o. male presents with the above complaint. Hx as above   Review of Systems: Negative except as noted in the HPI. Denies N/V/F/Ch.  Past Medical History:  Diagnosis Date  . Acid reflux   . Cancer ([place]) 09/05/13   left neck-non-hodgkins lymphoma  . Hypertension    requires diuretic  . Hypothyroidism   . Malaria    while in the service  . Peripheral vascular disease ([facility])    slow wound healing on legs    Current Outpatient Medications:  .  amLODipine (NORVASC) 5 MG tablet, TAKE 1 TABLET BY MOUTH  DAILY, Disp: 90 tablet, Rfl: 3 .  aspirin EC 81 MG tablet, Take 1 tablet (81 mg total) by mouth daily., Disp: , Rfl:  .  azithromycin (ZITHROMAX) 250 MG tablet, 2 tabs poqday1, 1 tab poqday 2-5, Disp: 6 tablet, Rfl: 0 .  benzonatate (TESSALON) 100 MG capsule, Take 1 capsule (100 mg total) by mouth 2 (two) times daily as needed for cough., Disp: 30 capsule, Rfl: 0 .  betamethasone dipropionate (DIPROLENE) 0.05 % cream, APPLY TO AFFECTED AREA TWICE A DAY, Disp: 45 g, Rfl: 1 .  enalapril (VASOTEC) 20 MG tablet, TAKE 1 TABLET BY MOUTH  EVERY DAY, Disp: 90 tablet, Rfl: 1 .  furosemide (LASIX) 20 MG tablet, TAKE 2 TABLETS BY MOUTH  EVERY DAY, Disp: 180 tablet, Rfl: 3 .  gabapentin (NEURONTIN) 100 MG capsule, TAKE 2 CAPSULES BY MOUTH  EVERY MORNING, Disp: 180 capsule, Rfl: 4 .  gabapentin (NEURONTIN) 300 MG capsule, TAKE 1 CAPSULE BY MOUTH AT  BEDTIME, Disp: 90 capsule, Rfl: 4 .  HYDROcodone-acetaminophen (NORCO) 5-325 MG tablet, Take 1 tablet by mouth every 6 (six) hours as needed for moderate pain., Disp: 30 tablet, Rfl: 0 .  levothyroxine (SYNTHROID) 112 MCG tablet, TAKE 1 TABLET BY  MOUTH  DAILY, Disp: 90 tablet, Rfl: 3 .  neomycin-colistin-hydrocortisone-thonzonium (CORTISPORIN-TC) 3.01-28-09-0.5 MG/ML OTIC suspension, Place 4 drops into the right ear 4 (four) times daily., Disp: 10 mL, Rfl: 0 .  tamsulosin (FLOMAX) 0.4 MG CAPS capsule, Take 1 capsule (0.4 mg total) by mouth daily., Disp: 90 capsule, Rfl: 1 .  vitamin B-12 (CYANOCOBALAMIN) 1000 MCG tablet, Take 1 tablet (1,000 mcg total) by mouth daily., Disp: 30 tablet, Rfl: 5  Social History   Tobacco Use  Smoking Status Former Smoker  . Types: Cigars  Smokeless Tobacco Former Systems developer  . Quit date: 10/05/1979    Allergies  Allergen Reactions  . Codeine Nausea And Vomiting  . Hydrocodone Nausea And Vomiting  . Percocet [Oxycodone-Acetaminophen] Nausea And Vomiting   Objective:   Vitals:   06/06/19 1645  Temp: 98.1 F (36.7 C)   There is no height or weight on file to calculate BMI. Constitutional Well developed. Well nourished.  Vascular Dorsalis pedis pulses palpable bilaterally. Posterior tibial pulses palpable bilaterally. Capillary refill normal to all digits.  No cyanosis or clubbing noted. Pedal hair growth normal.  Neurologic Normal speech. Oriented to person, place, and time. Epicritic sensation to light touch grossly present bilaterally.  Dermatologic Nails well groomed and normal in appearance. No open wounds. No skin  lesions.  Orthopedic: POP left 3rd IS with mulder's click   Assessment:   1. Morton neuroma, left    Plan:  Patient was evaluated and treated and all questions answered.  Interdigital Neuroma, left -Educated on etiology -Interspace injection delivered as below. -Educated on padding and proper shoegear  Procedure: Neuroma Injection Location: Left 3rd interspace Skin Prep: Alcohol. Injectate: 0.5 cc 0.5% marcaine plain, 0.5 cc dexamethasone phosphate. Disposition: Patient tolerated procedure well. Injection site dressed with a band-aid.  Return in about 3 weeks  (around 06/27/2019) for Neuroma, Left.

## 2019-06-23 NOTE — Progress Notes (Signed)
Subjective:  Patient ID: [name], male    DOB: 1919-07-08,  MRN: [id number]  Chief Complaint  Patient presents with  . Neuroma    Left foot neuroma follow up. Pt states no pain since his injection. No new concerns.    85 y.o. male presents with the above complaint. Hx as above. States the pain went away shortly after the injection.   Review of Systems: Negative except as noted in the HPI. Denies N/V/F/Ch.  Past Medical History:  Diagnosis Date  . Acid reflux   . Cancer ([place]) 09/05/13   left neck-non-hodgkins lymphoma  . Hypertension    requires diuretic  . Hypothyroidism   . Malaria    while in the service  . Peripheral vascular disease ([facility])    slow wound healing on legs    Current Outpatient Medications:  .  amLODipine (NORVASC) 5 MG tablet, TAKE 1 TABLET BY MOUTH  DAILY, Disp: 90 tablet, Rfl: 3 .  aspirin EC 81 MG tablet, Take 1 tablet (81 mg total) by mouth daily., Disp: , Rfl:  .  azithromycin (ZITHROMAX) 250 MG tablet, 2 tabs poqday1, 1 tab poqday 2-5, Disp: 6 tablet, Rfl: 0 .  benzonatate (TESSALON) 100 MG capsule, Take 1 capsule (100 mg total) by mouth 2 (two) times daily as needed for cough., Disp: 30 capsule, Rfl: 0 .  betamethasone dipropionate (DIPROLENE) 0.05 % cream, APPLY TO AFFECTED AREA TWICE A DAY, Disp: 45 g, Rfl: 1 .  enalapril (VASOTEC) 20 MG tablet, TAKE 1 TABLET BY MOUTH  EVERY DAY, Disp: 90 tablet, Rfl: 1 .  furosemide (LASIX) 20 MG tablet, TAKE 2 TABLETS BY MOUTH  EVERY DAY, Disp: 180 tablet, Rfl: 3 .  gabapentin (NEURONTIN) 100 MG capsule, TAKE 2 CAPSULES BY MOUTH  EVERY MORNING, Disp: 180 capsule, Rfl: 4 .  gabapentin (NEURONTIN) 300 MG capsule, TAKE 1 CAPSULE BY MOUTH AT  BEDTIME, Disp: 90 capsule, Rfl: 4 .  HYDROcodone-acetaminophen (NORCO) 5-325 MG tablet, Take 1 tablet by mouth every 6 (six) hours as needed for moderate pain., Disp: 30 tablet, Rfl: 0 .  levothyroxine (SYNTHROID) 112 MCG tablet, TAKE 1 TABLET BY MOUTH  DAILY, Disp: 90 tablet,  Rfl: 3 .  neomycin-colistin-hydrocortisone-thonzonium (CORTISPORIN-TC) 3.01-28-09-0.5 MG/ML OTIC suspension, Place 4 drops into the right ear 4 (four) times daily., Disp: 10 mL, Rfl: 0 .  tamsulosin (FLOMAX) 0.4 MG CAPS capsule, Take 1 capsule (0.4 mg total) by mouth daily., Disp: 90 capsule, Rfl: 1 .  vitamin B-12 (CYANOCOBALAMIN) 1000 MCG tablet, Take 1 tablet (1,000 mcg total) by mouth daily., Disp: 30 tablet, Rfl: 5  Social History   Tobacco Use  Smoking Status Former Smoker  . Types: Cigars  Smokeless Tobacco Former Systems developer  . Quit date: 10/05/1979    Allergies  Allergen Reactions  . Codeine Nausea And Vomiting  . Hydrocodone Nausea And Vomiting  . Percocet [Oxycodone-Acetaminophen] Nausea And Vomiting   Objective:   Vitals:   06/20/19 1601  Temp: 97.7 F (36.5 C)   There is no height or weight on file to calculate BMI. Constitutional Well developed. Well nourished.  Vascular Dorsalis pedis pulses palpable bilaterally. Posterior tibial pulses palpable bilaterally. Capillary refill normal to all digits.  No cyanosis or clubbing noted. Pedal hair growth normal.  Neurologic Normal speech. Oriented to person, place, and time. Epicritic sensation to light touch grossly present bilaterally.  Dermatologic Nails well groomed and normal in appearance. No open wounds. No skin lesions.  Orthopedic: No POP left 3rd IS  with mulder's click   Assessment:   1. Morton neuroma, left    Plan:  Patient was evaluated and treated and all questions answered.  Interdigital Neuroma, left -Improved s/p injection. No repeat injection today. -F/u should pain recur for further injection therapy.  No follow-ups on file.

## 2019-06-27 ENCOUNTER — Telehealth: Payer: Self-pay | Admitting: Family Medicine

## 2019-06-27 NOTE — Telephone Encounter (Signed)
Beck May called and states that you had said if she though pt needed therapy for his legs that we could set that up? Please advise. (he was just discharged from [facility] PT in March 2020)

## 2019-06-28 ENCOUNTER — Other Ambulatory Visit: Payer: Self-pay | Admitting: Family Medicine

## 2019-06-28 DIAGNOSIS — R531 Weakness: Secondary | ICD-10-CM

## 2019-06-28 NOTE — Telephone Encounter (Signed)
In last week or so pt c/o legs not wanting to work. Pt is sleeping a lot.

## 2019-06-28 NOTE — Telephone Encounter (Signed)
I ordered pt.

## 2019-07-08 ENCOUNTER — Telehealth: Payer: Self-pay | Admitting: Family Medicine

## 2019-07-08 NOTE — Telephone Encounter (Signed)
I received a community message from [facility] stating that the patient will need a face to face encounter before they can process a home Health referral for him due to medicare guidelines. Is it ok to bring this patient in?

## 2019-07-08 NOTE — Telephone Encounter (Signed)
Called and spoke with the patient and he stated that he would speak with his daughter and see when she can bring him and he will schedule at that time.

## 2019-07-08 NOTE — Telephone Encounter (Signed)
Explain to family.  Ok to schedule if they want to do that.

## 2019-07-16 ENCOUNTER — Other Ambulatory Visit: Payer: Self-pay

## 2019-07-16 ENCOUNTER — Ambulatory Visit (INDEPENDENT_AMBULATORY_CARE_PROVIDER_SITE_OTHER): Payer: Medicare Other | Admitting: Family Medicine

## 2019-07-16 ENCOUNTER — Encounter: Payer: Self-pay | Admitting: Family Medicine

## 2019-07-16 VITALS — BP 156/92 | HR 78 | Temp 97.8°F | Resp 18 | Ht 73.0 in | Wt 191.0 lb

## 2019-07-16 DIAGNOSIS — R531 Weakness: Secondary | ICD-10-CM

## 2019-07-16 NOTE — Progress Notes (Signed)
Subjective:    Patient ID: [name], male    DOB: September 30, 1919, 85 y.o.   MRN: [id number]  HPI Patient is a very pleasant 85 year old Caucasian male who presents today with his daughter due to weakness in his legs.  He has a history of degenerative disc disease in his lumbar spine.  This causes occasional low back pain.  About 2 weeks ago, the patient's back began to hurt him more.  As a result the patient became more sedentary.  He is having a difficult time standing up to get out of a chair.  He is having difficulty walking with his walker.  He feels more unsteady on his feet.  He denies any radiating pain going down his legs.  He denies any numbness or tingling in his legs.  He denies any bowel or bladder incontinence.  He denies any fevers or chills.  The pain in his back is better.  He denies any injuries.  He denies any tenderness to palpation over the spinous processes in his lumbar spine.  However he continues to report weakness primarily in his quadriceps and hamstring muscles.  On stand up and go test, the patient is very shaky as he stands up from the chair.  He wavers even holding onto a walker as though he may fall.  Once he is able to become fully erect, he is able to walk holding onto a walker but standing up causes him great difficulty.  He has normal reflexes checked his patella and his Achilles.  Muscle strength is 4/5 equal and symmetric in the lower extremities from his knees to his ankles.  He does have more weakness with hip flexion and hip extension.  I would rate this at 3/5 equal and symmetric bilaterally Past Medical History:  Diagnosis Date  . Acid reflux   . Cancer ([place]) 09/05/13   left neck-non-hodgkins lymphoma  . Hypertension    requires diuretic  . Hypothyroidism   . Malaria    while in the service  . Peripheral vascular disease ([place])    slow wound healing on legs   Past Surgical History:  Procedure Laterality Date  . AMPUTATION Left 06/25/2015   Procedure:  LEFT  HALLUX AMPUTATION,;  Surgeon: Wylene Simmer, MD;  Location: [place];  Service: Orthopedics;  Laterality: Left;  LOCAL/MAC  . CHOLECYSTECTOMY N/A 11/08/2018   Procedure: LAPAROSCOPIC CHOLECYSTECTOMY WITH INTRAOPERATIVE CHOLANGIOGRAM;  Surgeon: [name], MD;  [facility];  Service: General;  Laterality: N/A;  . MASS BIOPSY Left 09/05/2013   Procedure: EXCISIONAL BIOPSY OF LEFT NECK MASS;  Surgeon: Jerrell Belfast, MD;  Location: [place];  Service: ENT;  Laterality: Left;  . TONSILLECTOMY    . YAG LASER APPLICATION Right 4/97/0263   Procedure: YAG LASER APPLICATION;  Surgeon: [name], MD;  [facility];  Service: Ophthalmology;  Laterality: Right;   Current Outpatient Medications on File Prior to Visit  Medication Sig Dispense Refill  . amLODipine (NORVASC) 5 MG tablet TAKE 1 TABLET BY MOUTH  DAILY 90 tablet 3  . aspirin EC 81 MG tablet Take 1 tablet (81 mg total) by mouth daily.    . betamethasone dipropionate (DIPROLENE) 0.05 % cream APPLY TO AFFECTED AREA TWICE A DAY 45 g 1  . enalapril (VASOTEC) 20 MG tablet TAKE 1 TABLET BY MOUTH  EVERY DAY 90 tablet 1  . furosemide (LASIX) 20 MG tablet TAKE 2 TABLETS BY MOUTH  EVERY DAY 180 tablet 3  . HYDROcodone-acetaminophen (  NORCO) 5-325 MG tablet Take 1 tablet by mouth every 6 (six) hours as needed for moderate pain. 30 tablet 0  . levothyroxine (SYNTHROID) 112 MCG tablet TAKE 1 TABLET BY MOUTH  DAILY 90 tablet 3  . neomycin-colistin-hydrocortisone-thonzonium (CORTISPORIN-TC) 3.01-28-09-0.5 MG/ML OTIC suspension Place 4 drops into the right ear 4 (four) times daily. 10 mL 0  . tamsulosin (FLOMAX) 0.4 MG CAPS capsule Take 1 capsule (0.4 mg total) by mouth daily. 90 capsule 1  . vitamin B-12 (CYANOCOBALAMIN) 1000 MCG tablet Take 1 tablet (1,000 mcg total) by mouth daily. 30 tablet 5  . [DISCONTINUED] famotidine (PEPCID) 20 MG tablet Take 20 mg by mouth 2 (two) times daily.     No current facility-administered  medications on file prior to visit.    Allergies  Allergen Reactions  . Codeine Nausea And Vomiting  . Hydrocodone Nausea And Vomiting  . Percocet [Oxycodone-Acetaminophen] Nausea And Vomiting   Social History   Socioeconomic History  . Marital status: Married    Spouse name: Not on file  . Number of children: 4  . Years of education: Not on file  . Highest education level: Not on file  Occupational History  . Occupation: retired  Scientific laboratory technician  . Financial resource strain: Not on file  . Food insecurity    Worry: Not on file    Inability: Not on file  . Transportation needs    Medical: Not on file    Non-medical: Not on file  Tobacco Use  . Smoking status: Former Smoker    Types: Cigars  . Smokeless tobacco: Former Systems developer    Quit date: 10/05/1979  Substance and Sexual Activity  . Alcohol use: No  . Drug use: No  . Sexual activity: Not on file  Lifestyle  . Physical activity    Days per week: Not on file    Minutes per session: Not on file  . Stress: Not on file  Relationships  . Social Herbalist on phone: Not on file    Gets together: Not on file    Attends religious service: Not on file    Active member of club or organization: Not on file    Attends meetings of clubs or organizations: Not on file    Relationship status: Not on file  . Intimate partner violence    Fear of current or ex partner: Not on file    Emotionally abused: Not on file    Physically abused: Not on file    Forced sexual activity: Not on file  Other Topics Concern  . Not on file  Social History Narrative  . Not on file      Review of Systems  All other systems reviewed and are negative.      Objective:   Physical Exam  Constitutional: He appears well-developed and well-nourished. No distress.  HENT:  Right Ear: External ear normal.  Left Ear: External ear normal.  Mouth/Throat: Oropharynx is clear and moist. No oropharyngeal exudate.  Cardiovascular: Normal rate,  regular rhythm and normal heart sounds. Exam reveals no gallop and no friction rub.  No murmur heard. Pulmonary/Chest: Effort normal and breath sounds normal. No respiratory distress. He has no wheezes. He has no rales. He exhibits no tenderness.  Abdominal: Soft. Bowel sounds are normal. He exhibits no distension. There is no abdominal tenderness. There is no rebound and no guarding.  Musculoskeletal:        General: No edema.     Right  hip: He exhibits decreased strength. He exhibits normal range of motion and no tenderness.     Left hip: He exhibits decreased strength. He exhibits normal range of motion and no tenderness.     Lumbar back: He exhibits pain. He exhibits normal range of motion, no tenderness, no bony tenderness and no spasm.  Skin: He is not diaphoretic.  Vitals reviewed.         Assessment & Plan:  The encounter diagnosis was Weakness. Patient has weakness in his lower extremities I believe due to age and deconditioning.  This makes him an extremely high fall risk.  I have recommended physical therapy however patient has a tremendous difficulty even leaving his home due to the weakness in his legs.  Therefore I recommended home health physical therapy.  I believe that without the physical therapy he would have a high likelihood of falling and potentially fracturing his hip or another major bone.

## 2019-07-22 DIAGNOSIS — I739 Peripheral vascular disease, unspecified: Secondary | ICD-10-CM | POA: Diagnosis not present

## 2019-07-22 DIAGNOSIS — E039 Hypothyroidism, unspecified: Secondary | ICD-10-CM | POA: Diagnosis not present

## 2019-07-22 DIAGNOSIS — M5135 Other intervertebral disc degeneration, thoracolumbar region: Secondary | ICD-10-CM | POA: Diagnosis not present

## 2019-07-22 DIAGNOSIS — N183 Chronic kidney disease, stage 3 (moderate): Secondary | ICD-10-CM | POA: Diagnosis not present

## 2019-07-22 DIAGNOSIS — I872 Venous insufficiency (chronic) (peripheral): Secondary | ICD-10-CM | POA: Diagnosis not present

## 2019-07-22 DIAGNOSIS — C8591 Non-Hodgkin lymphoma, unspecified, lymph nodes of head, face, and neck: Secondary | ICD-10-CM | POA: Diagnosis not present

## 2019-07-22 DIAGNOSIS — Z89412 Acquired absence of left great toe: Secondary | ICD-10-CM | POA: Diagnosis not present

## 2019-07-22 DIAGNOSIS — Z87891 Personal history of nicotine dependence: Secondary | ICD-10-CM | POA: Diagnosis not present

## 2019-07-22 DIAGNOSIS — I129 Hypertensive chronic kidney disease with stage 1 through stage 4 chronic kidney disease, or unspecified chronic kidney disease: Secondary | ICD-10-CM | POA: Diagnosis not present

## 2019-07-22 DIAGNOSIS — Z7982 Long term (current) use of aspirin: Secondary | ICD-10-CM | POA: Diagnosis not present

## 2019-07-26 DIAGNOSIS — I739 Peripheral vascular disease, unspecified: Secondary | ICD-10-CM | POA: Diagnosis not present

## 2019-07-26 DIAGNOSIS — C8591 Non-Hodgkin lymphoma, unspecified, lymph nodes of head, face, and neck: Secondary | ICD-10-CM | POA: Diagnosis not present

## 2019-07-26 DIAGNOSIS — I129 Hypertensive chronic kidney disease with stage 1 through stage 4 chronic kidney disease, or unspecified chronic kidney disease: Secondary | ICD-10-CM | POA: Diagnosis not present

## 2019-07-26 DIAGNOSIS — N183 Chronic kidney disease, stage 3 (moderate): Secondary | ICD-10-CM | POA: Diagnosis not present

## 2019-07-26 DIAGNOSIS — I872 Venous insufficiency (chronic) (peripheral): Secondary | ICD-10-CM | POA: Diagnosis not present

## 2019-07-26 DIAGNOSIS — M5135 Other intervertebral disc degeneration, thoracolumbar region: Secondary | ICD-10-CM | POA: Diagnosis not present

## 2019-07-26 DIAGNOSIS — Z87891 Personal history of nicotine dependence: Secondary | ICD-10-CM | POA: Diagnosis not present

## 2019-07-26 DIAGNOSIS — Z7982 Long term (current) use of aspirin: Secondary | ICD-10-CM | POA: Diagnosis not present

## 2019-07-26 DIAGNOSIS — E039 Hypothyroidism, unspecified: Secondary | ICD-10-CM | POA: Diagnosis not present

## 2019-07-26 DIAGNOSIS — Z89412 Acquired absence of left great toe: Secondary | ICD-10-CM | POA: Diagnosis not present

## 2019-07-31 DIAGNOSIS — I872 Venous insufficiency (chronic) (peripheral): Secondary | ICD-10-CM | POA: Diagnosis not present

## 2019-07-31 DIAGNOSIS — Z89412 Acquired absence of left great toe: Secondary | ICD-10-CM | POA: Diagnosis not present

## 2019-07-31 DIAGNOSIS — I739 Peripheral vascular disease, unspecified: Secondary | ICD-10-CM | POA: Diagnosis not present

## 2019-07-31 DIAGNOSIS — Z87891 Personal history of nicotine dependence: Secondary | ICD-10-CM | POA: Diagnosis not present

## 2019-07-31 DIAGNOSIS — N183 Chronic kidney disease, stage 3 (moderate): Secondary | ICD-10-CM | POA: Diagnosis not present

## 2019-07-31 DIAGNOSIS — E039 Hypothyroidism, unspecified: Secondary | ICD-10-CM | POA: Diagnosis not present

## 2019-07-31 DIAGNOSIS — C8591 Non-Hodgkin lymphoma, unspecified, lymph nodes of head, face, and neck: Secondary | ICD-10-CM | POA: Diagnosis not present

## 2019-07-31 DIAGNOSIS — I129 Hypertensive chronic kidney disease with stage 1 through stage 4 chronic kidney disease, or unspecified chronic kidney disease: Secondary | ICD-10-CM | POA: Diagnosis not present

## 2019-07-31 DIAGNOSIS — M5135 Other intervertebral disc degeneration, thoracolumbar region: Secondary | ICD-10-CM | POA: Diagnosis not present

## 2019-07-31 DIAGNOSIS — Z7982 Long term (current) use of aspirin: Secondary | ICD-10-CM | POA: Diagnosis not present

## 2019-08-02 DIAGNOSIS — I872 Venous insufficiency (chronic) (peripheral): Secondary | ICD-10-CM | POA: Diagnosis not present

## 2019-08-02 DIAGNOSIS — I739 Peripheral vascular disease, unspecified: Secondary | ICD-10-CM | POA: Diagnosis not present

## 2019-08-02 DIAGNOSIS — C8591 Non-Hodgkin lymphoma, unspecified, lymph nodes of head, face, and neck: Secondary | ICD-10-CM | POA: Diagnosis not present

## 2019-08-02 DIAGNOSIS — M5135 Other intervertebral disc degeneration, thoracolumbar region: Secondary | ICD-10-CM | POA: Diagnosis not present

## 2019-08-02 DIAGNOSIS — Z87891 Personal history of nicotine dependence: Secondary | ICD-10-CM | POA: Diagnosis not present

## 2019-08-02 DIAGNOSIS — Z89412 Acquired absence of left great toe: Secondary | ICD-10-CM | POA: Diagnosis not present

## 2019-08-02 DIAGNOSIS — E039 Hypothyroidism, unspecified: Secondary | ICD-10-CM | POA: Diagnosis not present

## 2019-08-02 DIAGNOSIS — N183 Chronic kidney disease, stage 3 (moderate): Secondary | ICD-10-CM | POA: Diagnosis not present

## 2019-08-02 DIAGNOSIS — Z7982 Long term (current) use of aspirin: Secondary | ICD-10-CM | POA: Diagnosis not present

## 2019-08-02 DIAGNOSIS — I129 Hypertensive chronic kidney disease with stage 1 through stage 4 chronic kidney disease, or unspecified chronic kidney disease: Secondary | ICD-10-CM | POA: Diagnosis not present

## 2019-08-07 DIAGNOSIS — N183 Chronic kidney disease, stage 3 (moderate): Secondary | ICD-10-CM | POA: Diagnosis not present

## 2019-08-07 DIAGNOSIS — Z7982 Long term (current) use of aspirin: Secondary | ICD-10-CM | POA: Diagnosis not present

## 2019-08-07 DIAGNOSIS — E039 Hypothyroidism, unspecified: Secondary | ICD-10-CM | POA: Diagnosis not present

## 2019-08-07 DIAGNOSIS — M5135 Other intervertebral disc degeneration, thoracolumbar region: Secondary | ICD-10-CM | POA: Diagnosis not present

## 2019-08-07 DIAGNOSIS — C8591 Non-Hodgkin lymphoma, unspecified, lymph nodes of head, face, and neck: Secondary | ICD-10-CM | POA: Diagnosis not present

## 2019-08-07 DIAGNOSIS — Z87891 Personal history of nicotine dependence: Secondary | ICD-10-CM | POA: Diagnosis not present

## 2019-08-07 DIAGNOSIS — I129 Hypertensive chronic kidney disease with stage 1 through stage 4 chronic kidney disease, or unspecified chronic kidney disease: Secondary | ICD-10-CM | POA: Diagnosis not present

## 2019-08-07 DIAGNOSIS — I872 Venous insufficiency (chronic) (peripheral): Secondary | ICD-10-CM | POA: Diagnosis not present

## 2019-08-07 DIAGNOSIS — I739 Peripheral vascular disease, unspecified: Secondary | ICD-10-CM | POA: Diagnosis not present

## 2019-08-07 DIAGNOSIS — Z89412 Acquired absence of left great toe: Secondary | ICD-10-CM | POA: Diagnosis not present

## 2019-08-09 DIAGNOSIS — I129 Hypertensive chronic kidney disease with stage 1 through stage 4 chronic kidney disease, or unspecified chronic kidney disease: Secondary | ICD-10-CM | POA: Diagnosis not present

## 2019-08-09 DIAGNOSIS — C8591 Non-Hodgkin lymphoma, unspecified, lymph nodes of head, face, and neck: Secondary | ICD-10-CM | POA: Diagnosis not present

## 2019-08-09 DIAGNOSIS — Z7982 Long term (current) use of aspirin: Secondary | ICD-10-CM | POA: Diagnosis not present

## 2019-08-09 DIAGNOSIS — M5135 Other intervertebral disc degeneration, thoracolumbar region: Secondary | ICD-10-CM | POA: Diagnosis not present

## 2019-08-09 DIAGNOSIS — I739 Peripheral vascular disease, unspecified: Secondary | ICD-10-CM | POA: Diagnosis not present

## 2019-08-09 DIAGNOSIS — I872 Venous insufficiency (chronic) (peripheral): Secondary | ICD-10-CM | POA: Diagnosis not present

## 2019-08-09 DIAGNOSIS — N183 Chronic kidney disease, stage 3 (moderate): Secondary | ICD-10-CM | POA: Diagnosis not present

## 2019-08-09 DIAGNOSIS — E039 Hypothyroidism, unspecified: Secondary | ICD-10-CM | POA: Diagnosis not present

## 2019-08-09 DIAGNOSIS — Z87891 Personal history of nicotine dependence: Secondary | ICD-10-CM | POA: Diagnosis not present

## 2019-08-09 DIAGNOSIS — Z89412 Acquired absence of left great toe: Secondary | ICD-10-CM | POA: Diagnosis not present

## 2019-08-13 DIAGNOSIS — N183 Chronic kidney disease, stage 3 (moderate): Secondary | ICD-10-CM | POA: Diagnosis not present

## 2019-08-13 DIAGNOSIS — Z87891 Personal history of nicotine dependence: Secondary | ICD-10-CM | POA: Diagnosis not present

## 2019-08-13 DIAGNOSIS — Z7982 Long term (current) use of aspirin: Secondary | ICD-10-CM | POA: Diagnosis not present

## 2019-08-13 DIAGNOSIS — I129 Hypertensive chronic kidney disease with stage 1 through stage 4 chronic kidney disease, or unspecified chronic kidney disease: Secondary | ICD-10-CM | POA: Diagnosis not present

## 2019-08-13 DIAGNOSIS — M5135 Other intervertebral disc degeneration, thoracolumbar region: Secondary | ICD-10-CM | POA: Diagnosis not present

## 2019-08-13 DIAGNOSIS — Z89412 Acquired absence of left great toe: Secondary | ICD-10-CM | POA: Diagnosis not present

## 2019-08-13 DIAGNOSIS — E039 Hypothyroidism, unspecified: Secondary | ICD-10-CM | POA: Diagnosis not present

## 2019-08-13 DIAGNOSIS — C8591 Non-Hodgkin lymphoma, unspecified, lymph nodes of head, face, and neck: Secondary | ICD-10-CM | POA: Diagnosis not present

## 2019-08-13 DIAGNOSIS — I739 Peripheral vascular disease, unspecified: Secondary | ICD-10-CM | POA: Diagnosis not present

## 2019-08-13 DIAGNOSIS — I872 Venous insufficiency (chronic) (peripheral): Secondary | ICD-10-CM | POA: Diagnosis not present

## 2019-08-16 DIAGNOSIS — M5135 Other intervertebral disc degeneration, thoracolumbar region: Secondary | ICD-10-CM | POA: Diagnosis not present

## 2019-08-16 DIAGNOSIS — N183 Chronic kidney disease, stage 3 (moderate): Secondary | ICD-10-CM | POA: Diagnosis not present

## 2019-08-16 DIAGNOSIS — E039 Hypothyroidism, unspecified: Secondary | ICD-10-CM | POA: Diagnosis not present

## 2019-08-16 DIAGNOSIS — Z89412 Acquired absence of left great toe: Secondary | ICD-10-CM | POA: Diagnosis not present

## 2019-08-16 DIAGNOSIS — I129 Hypertensive chronic kidney disease with stage 1 through stage 4 chronic kidney disease, or unspecified chronic kidney disease: Secondary | ICD-10-CM | POA: Diagnosis not present

## 2019-08-16 DIAGNOSIS — C8591 Non-Hodgkin lymphoma, unspecified, lymph nodes of head, face, and neck: Secondary | ICD-10-CM | POA: Diagnosis not present

## 2019-08-16 DIAGNOSIS — I739 Peripheral vascular disease, unspecified: Secondary | ICD-10-CM | POA: Diagnosis not present

## 2019-08-16 DIAGNOSIS — Z87891 Personal history of nicotine dependence: Secondary | ICD-10-CM | POA: Diagnosis not present

## 2019-08-16 DIAGNOSIS — I872 Venous insufficiency (chronic) (peripheral): Secondary | ICD-10-CM | POA: Diagnosis not present

## 2019-08-16 DIAGNOSIS — Z7982 Long term (current) use of aspirin: Secondary | ICD-10-CM | POA: Diagnosis not present

## 2019-08-20 DIAGNOSIS — I739 Peripheral vascular disease, unspecified: Secondary | ICD-10-CM | POA: Diagnosis not present

## 2019-08-20 DIAGNOSIS — Z89412 Acquired absence of left great toe: Secondary | ICD-10-CM | POA: Diagnosis not present

## 2019-08-20 DIAGNOSIS — N183 Chronic kidney disease, stage 3 (moderate): Secondary | ICD-10-CM | POA: Diagnosis not present

## 2019-08-20 DIAGNOSIS — I872 Venous insufficiency (chronic) (peripheral): Secondary | ICD-10-CM | POA: Diagnosis not present

## 2019-08-20 DIAGNOSIS — C8591 Non-Hodgkin lymphoma, unspecified, lymph nodes of head, face, and neck: Secondary | ICD-10-CM | POA: Diagnosis not present

## 2019-08-20 DIAGNOSIS — M5135 Other intervertebral disc degeneration, thoracolumbar region: Secondary | ICD-10-CM | POA: Diagnosis not present

## 2019-08-20 DIAGNOSIS — Z87891 Personal history of nicotine dependence: Secondary | ICD-10-CM | POA: Diagnosis not present

## 2019-08-20 DIAGNOSIS — Z7982 Long term (current) use of aspirin: Secondary | ICD-10-CM | POA: Diagnosis not present

## 2019-08-20 DIAGNOSIS — E039 Hypothyroidism, unspecified: Secondary | ICD-10-CM | POA: Diagnosis not present

## 2019-08-20 DIAGNOSIS — I129 Hypertensive chronic kidney disease with stage 1 through stage 4 chronic kidney disease, or unspecified chronic kidney disease: Secondary | ICD-10-CM | POA: Diagnosis not present

## 2019-08-22 DIAGNOSIS — M5135 Other intervertebral disc degeneration, thoracolumbar region: Secondary | ICD-10-CM | POA: Diagnosis not present

## 2019-08-22 DIAGNOSIS — Z87891 Personal history of nicotine dependence: Secondary | ICD-10-CM | POA: Diagnosis not present

## 2019-08-22 DIAGNOSIS — E039 Hypothyroidism, unspecified: Secondary | ICD-10-CM | POA: Diagnosis not present

## 2019-08-22 DIAGNOSIS — Z7982 Long term (current) use of aspirin: Secondary | ICD-10-CM | POA: Diagnosis not present

## 2019-08-22 DIAGNOSIS — I129 Hypertensive chronic kidney disease with stage 1 through stage 4 chronic kidney disease, or unspecified chronic kidney disease: Secondary | ICD-10-CM | POA: Diagnosis not present

## 2019-08-22 DIAGNOSIS — N183 Chronic kidney disease, stage 3 (moderate): Secondary | ICD-10-CM | POA: Diagnosis not present

## 2019-08-22 DIAGNOSIS — I872 Venous insufficiency (chronic) (peripheral): Secondary | ICD-10-CM | POA: Diagnosis not present

## 2019-08-22 DIAGNOSIS — I739 Peripheral vascular disease, unspecified: Secondary | ICD-10-CM | POA: Diagnosis not present

## 2019-08-22 DIAGNOSIS — Z89412 Acquired absence of left great toe: Secondary | ICD-10-CM | POA: Diagnosis not present

## 2019-08-22 DIAGNOSIS — C8591 Non-Hodgkin lymphoma, unspecified, lymph nodes of head, face, and neck: Secondary | ICD-10-CM | POA: Diagnosis not present

## 2019-08-27 DIAGNOSIS — M5135 Other intervertebral disc degeneration, thoracolumbar region: Secondary | ICD-10-CM | POA: Diagnosis not present

## 2019-08-27 DIAGNOSIS — Z89412 Acquired absence of left great toe: Secondary | ICD-10-CM | POA: Diagnosis not present

## 2019-08-27 DIAGNOSIS — I739 Peripheral vascular disease, unspecified: Secondary | ICD-10-CM | POA: Diagnosis not present

## 2019-08-27 DIAGNOSIS — Z7982 Long term (current) use of aspirin: Secondary | ICD-10-CM | POA: Diagnosis not present

## 2019-08-27 DIAGNOSIS — Z87891 Personal history of nicotine dependence: Secondary | ICD-10-CM | POA: Diagnosis not present

## 2019-08-27 DIAGNOSIS — C8591 Non-Hodgkin lymphoma, unspecified, lymph nodes of head, face, and neck: Secondary | ICD-10-CM | POA: Diagnosis not present

## 2019-08-27 DIAGNOSIS — I872 Venous insufficiency (chronic) (peripheral): Secondary | ICD-10-CM | POA: Diagnosis not present

## 2019-08-27 DIAGNOSIS — I129 Hypertensive chronic kidney disease with stage 1 through stage 4 chronic kidney disease, or unspecified chronic kidney disease: Secondary | ICD-10-CM | POA: Diagnosis not present

## 2019-08-27 DIAGNOSIS — N183 Chronic kidney disease, stage 3 (moderate): Secondary | ICD-10-CM | POA: Diagnosis not present

## 2019-08-27 DIAGNOSIS — E039 Hypothyroidism, unspecified: Secondary | ICD-10-CM | POA: Diagnosis not present

## 2019-08-28 ENCOUNTER — Telehealth: Payer: Self-pay | Admitting: Family Medicine

## 2019-08-28 NOTE — Telephone Encounter (Signed)
[name] from [facility] calling to get verbal orders for this patient [phone]

## 2019-08-28 NOTE — Telephone Encounter (Signed)
VO Given.

## 2019-08-30 DIAGNOSIS — I739 Peripheral vascular disease, unspecified: Secondary | ICD-10-CM | POA: Diagnosis not present

## 2019-08-30 DIAGNOSIS — M5135 Other intervertebral disc degeneration, thoracolumbar region: Secondary | ICD-10-CM | POA: Diagnosis not present

## 2019-08-30 DIAGNOSIS — Z89412 Acquired absence of left great toe: Secondary | ICD-10-CM | POA: Diagnosis not present

## 2019-08-30 DIAGNOSIS — C8591 Non-Hodgkin lymphoma, unspecified, lymph nodes of head, face, and neck: Secondary | ICD-10-CM | POA: Diagnosis not present

## 2019-08-30 DIAGNOSIS — Z87891 Personal history of nicotine dependence: Secondary | ICD-10-CM | POA: Diagnosis not present

## 2019-08-30 DIAGNOSIS — I872 Venous insufficiency (chronic) (peripheral): Secondary | ICD-10-CM | POA: Diagnosis not present

## 2019-08-30 DIAGNOSIS — E039 Hypothyroidism, unspecified: Secondary | ICD-10-CM | POA: Diagnosis not present

## 2019-08-30 DIAGNOSIS — Z7982 Long term (current) use of aspirin: Secondary | ICD-10-CM | POA: Diagnosis not present

## 2019-08-30 DIAGNOSIS — I129 Hypertensive chronic kidney disease with stage 1 through stage 4 chronic kidney disease, or unspecified chronic kidney disease: Secondary | ICD-10-CM | POA: Diagnosis not present

## 2019-09-03 DIAGNOSIS — Z7982 Long term (current) use of aspirin: Secondary | ICD-10-CM | POA: Diagnosis not present

## 2019-09-03 DIAGNOSIS — E039 Hypothyroidism, unspecified: Secondary | ICD-10-CM | POA: Diagnosis not present

## 2019-09-03 DIAGNOSIS — Z87891 Personal history of nicotine dependence: Secondary | ICD-10-CM | POA: Diagnosis not present

## 2019-09-03 DIAGNOSIS — I872 Venous insufficiency (chronic) (peripheral): Secondary | ICD-10-CM | POA: Diagnosis not present

## 2019-09-03 DIAGNOSIS — I129 Hypertensive chronic kidney disease with stage 1 through stage 4 chronic kidney disease, or unspecified chronic kidney disease: Secondary | ICD-10-CM | POA: Diagnosis not present

## 2019-09-03 DIAGNOSIS — M5135 Other intervertebral disc degeneration, thoracolumbar region: Secondary | ICD-10-CM | POA: Diagnosis not present

## 2019-09-03 DIAGNOSIS — C8591 Non-Hodgkin lymphoma, unspecified, lymph nodes of head, face, and neck: Secondary | ICD-10-CM | POA: Diagnosis not present

## 2019-09-03 DIAGNOSIS — I739 Peripheral vascular disease, unspecified: Secondary | ICD-10-CM | POA: Diagnosis not present

## 2019-09-03 DIAGNOSIS — Z89412 Acquired absence of left great toe: Secondary | ICD-10-CM | POA: Diagnosis not present

## 2019-09-06 DIAGNOSIS — I872 Venous insufficiency (chronic) (peripheral): Secondary | ICD-10-CM | POA: Diagnosis not present

## 2019-09-06 DIAGNOSIS — M5135 Other intervertebral disc degeneration, thoracolumbar region: Secondary | ICD-10-CM | POA: Diagnosis not present

## 2019-09-06 DIAGNOSIS — Z7982 Long term (current) use of aspirin: Secondary | ICD-10-CM | POA: Diagnosis not present

## 2019-09-06 DIAGNOSIS — C8591 Non-Hodgkin lymphoma, unspecified, lymph nodes of head, face, and neck: Secondary | ICD-10-CM | POA: Diagnosis not present

## 2019-09-06 DIAGNOSIS — I129 Hypertensive chronic kidney disease with stage 1 through stage 4 chronic kidney disease, or unspecified chronic kidney disease: Secondary | ICD-10-CM | POA: Diagnosis not present

## 2019-09-06 DIAGNOSIS — I739 Peripheral vascular disease, unspecified: Secondary | ICD-10-CM | POA: Diagnosis not present

## 2019-09-06 DIAGNOSIS — E039 Hypothyroidism, unspecified: Secondary | ICD-10-CM | POA: Diagnosis not present

## 2019-09-06 DIAGNOSIS — Z89412 Acquired absence of left great toe: Secondary | ICD-10-CM | POA: Diagnosis not present

## 2019-09-06 DIAGNOSIS — Z87891 Personal history of nicotine dependence: Secondary | ICD-10-CM | POA: Diagnosis not present

## 2019-09-09 ENCOUNTER — Ambulatory Visit (INDEPENDENT_AMBULATORY_CARE_PROVIDER_SITE_OTHER): Payer: Medicare Other

## 2019-09-09 ENCOUNTER — Other Ambulatory Visit: Payer: Self-pay

## 2019-09-09 DIAGNOSIS — Z23 Encounter for immunization: Secondary | ICD-10-CM | POA: Diagnosis not present

## 2019-09-10 DIAGNOSIS — Z87891 Personal history of nicotine dependence: Secondary | ICD-10-CM | POA: Diagnosis not present

## 2019-09-10 DIAGNOSIS — Z7982 Long term (current) use of aspirin: Secondary | ICD-10-CM | POA: Diagnosis not present

## 2019-09-10 DIAGNOSIS — I872 Venous insufficiency (chronic) (peripheral): Secondary | ICD-10-CM | POA: Diagnosis not present

## 2019-09-10 DIAGNOSIS — C8591 Non-Hodgkin lymphoma, unspecified, lymph nodes of head, face, and neck: Secondary | ICD-10-CM | POA: Diagnosis not present

## 2019-09-10 DIAGNOSIS — Z89412 Acquired absence of left great toe: Secondary | ICD-10-CM | POA: Diagnosis not present

## 2019-09-10 DIAGNOSIS — E039 Hypothyroidism, unspecified: Secondary | ICD-10-CM | POA: Diagnosis not present

## 2019-09-10 DIAGNOSIS — I129 Hypertensive chronic kidney disease with stage 1 through stage 4 chronic kidney disease, or unspecified chronic kidney disease: Secondary | ICD-10-CM | POA: Diagnosis not present

## 2019-09-10 DIAGNOSIS — M5135 Other intervertebral disc degeneration, thoracolumbar region: Secondary | ICD-10-CM | POA: Diagnosis not present

## 2019-09-10 DIAGNOSIS — I739 Peripheral vascular disease, unspecified: Secondary | ICD-10-CM | POA: Diagnosis not present

## 2019-09-12 DIAGNOSIS — I872 Venous insufficiency (chronic) (peripheral): Secondary | ICD-10-CM | POA: Diagnosis not present

## 2019-09-12 DIAGNOSIS — I129 Hypertensive chronic kidney disease with stage 1 through stage 4 chronic kidney disease, or unspecified chronic kidney disease: Secondary | ICD-10-CM | POA: Diagnosis not present

## 2019-09-12 DIAGNOSIS — I739 Peripheral vascular disease, unspecified: Secondary | ICD-10-CM | POA: Diagnosis not present

## 2019-09-12 DIAGNOSIS — E039 Hypothyroidism, unspecified: Secondary | ICD-10-CM | POA: Diagnosis not present

## 2019-09-12 DIAGNOSIS — Z87891 Personal history of nicotine dependence: Secondary | ICD-10-CM | POA: Diagnosis not present

## 2019-09-12 DIAGNOSIS — C8591 Non-Hodgkin lymphoma, unspecified, lymph nodes of head, face, and neck: Secondary | ICD-10-CM | POA: Diagnosis not present

## 2019-09-12 DIAGNOSIS — Z89412 Acquired absence of left great toe: Secondary | ICD-10-CM | POA: Diagnosis not present

## 2019-09-12 DIAGNOSIS — Z7982 Long term (current) use of aspirin: Secondary | ICD-10-CM | POA: Diagnosis not present

## 2019-09-12 DIAGNOSIS — M5135 Other intervertebral disc degeneration, thoracolumbar region: Secondary | ICD-10-CM | POA: Diagnosis not present

## 2019-09-18 ENCOUNTER — Other Ambulatory Visit: Payer: Self-pay

## 2019-09-18 NOTE — Patient Outreach (Signed)
[facility]) Care Management  09/18/2019  KIYAN BURMESTER 16-Feb-1919 [id number]   Medication Adherence call to Mr. Ramondo Dietze [facility] spoke with patient he is past due on Enalepril 20 mg patient explain he takes 1 tablet daily he has a pill box that his daughter set up.patient said his daughter takes care of all his medications and has medication at this time. Mr. Coleman is showing past due under [place].   [facility] Direct Dial [phone]  Fax [phone] [email or web address]

## 2019-10-27 ENCOUNTER — Other Ambulatory Visit: Payer: Self-pay | Admitting: Family Medicine

## 2019-11-09 ENCOUNTER — Other Ambulatory Visit: Payer: Self-pay | Admitting: Family Medicine

## 2019-12-08 ENCOUNTER — Other Ambulatory Visit: Payer: Self-pay | Admitting: Family Medicine

## 2019-12-19 DIAGNOSIS — H353233 Exudative age-related macular degeneration, bilateral, with inactive scar: Secondary | ICD-10-CM | POA: Diagnosis not present

## 2019-12-19 DIAGNOSIS — H43391 Other vitreous opacities, right eye: Secondary | ICD-10-CM | POA: Diagnosis not present

## 2019-12-19 DIAGNOSIS — H43813 Vitreous degeneration, bilateral: Secondary | ICD-10-CM | POA: Diagnosis not present

## 2019-12-19 DIAGNOSIS — H35433 Paving stone degeneration of retina, bilateral: Secondary | ICD-10-CM | POA: Diagnosis not present

## 2020-01-24 DIAGNOSIS — D485 Neoplasm of uncertain behavior of skin: Secondary | ICD-10-CM | POA: Diagnosis not present

## 2020-01-24 DIAGNOSIS — Z85828 Personal history of other malignant neoplasm of skin: Secondary | ICD-10-CM | POA: Diagnosis not present

## 2020-01-24 DIAGNOSIS — L720 Epidermal cyst: Secondary | ICD-10-CM | POA: Diagnosis not present

## 2020-01-24 DIAGNOSIS — C44311 Basal cell carcinoma of skin of nose: Secondary | ICD-10-CM | POA: Diagnosis not present

## 2020-01-24 DIAGNOSIS — C44212 Basal cell carcinoma of skin of right ear and external auricular canal: Secondary | ICD-10-CM | POA: Diagnosis not present

## 2020-01-24 DIAGNOSIS — C44219 Basal cell carcinoma of skin of left ear and external auricular canal: Secondary | ICD-10-CM | POA: Diagnosis not present

## 2020-01-24 DIAGNOSIS — C44529 Squamous cell carcinoma of skin of other part of trunk: Secondary | ICD-10-CM | POA: Diagnosis not present

## 2020-01-24 DIAGNOSIS — C4441 Basal cell carcinoma of skin of scalp and neck: Secondary | ICD-10-CM | POA: Diagnosis not present

## 2020-02-13 DIAGNOSIS — L308 Other specified dermatitis: Secondary | ICD-10-CM | POA: Diagnosis not present

## 2020-02-13 DIAGNOSIS — L821 Other seborrheic keratosis: Secondary | ICD-10-CM | POA: Diagnosis not present

## 2020-02-13 DIAGNOSIS — Z85828 Personal history of other malignant neoplasm of skin: Secondary | ICD-10-CM | POA: Diagnosis not present

## 2020-02-18 DIAGNOSIS — C44219 Basal cell carcinoma of skin of left ear and external auricular canal: Secondary | ICD-10-CM | POA: Diagnosis not present

## 2020-02-18 DIAGNOSIS — C44529 Squamous cell carcinoma of skin of other part of trunk: Secondary | ICD-10-CM | POA: Diagnosis not present

## 2020-02-26 ENCOUNTER — Telehealth: Payer: Self-pay | Admitting: Family Medicine

## 2020-02-26 NOTE — Telephone Encounter (Signed)
Becky called and states that pt is not sleeping at night and "talking out of his head". She would like to know if you could prescribed him something to help him sleep?

## 2020-02-27 ENCOUNTER — Other Ambulatory Visit: Payer: Self-pay | Admitting: Family Medicine

## 2020-02-27 MED ORDER — ALPRAZOLAM 0.5 MG PO TABS: 0.5000 mg | ORAL_TABLET | Freq: Every evening | ORAL | 0 refills | Status: DC | PRN

## 2020-02-27 NOTE — Telephone Encounter (Signed)
Try xanax at night.  I will send to pharmacy.

## 2020-02-27 NOTE — Telephone Encounter (Signed)
Becky aware.

## 2020-03-02 DIAGNOSIS — C44311 Basal cell carcinoma of skin of nose: Secondary | ICD-10-CM | POA: Diagnosis not present

## 2020-03-02 DIAGNOSIS — C44212 Basal cell carcinoma of skin of right ear and external auricular canal: Secondary | ICD-10-CM | POA: Diagnosis not present

## 2020-03-11 ENCOUNTER — Telehealth: Payer: Self-pay | Admitting: Family Medicine

## 2020-03-11 NOTE — Telephone Encounter (Signed)
Becky called and states that she needs someone to call her back about the pt. States that the xanax knocked him out so bad that they like to have never got him awake the next day. He is still not sleeping and his tremors are getting worse. She would like for someone to call her back.  Called Olevia Bowens  - [place]

## 2020-03-11 NOTE — Telephone Encounter (Signed)
[name] called back and states that pt is having hallucinations still and talking out of his head. He is not sleeping at night but does not want soemething that will knock him out for days. She will bring him in if necessary.

## 2020-03-12 NOTE — Telephone Encounter (Signed)
NTBS for evaluation

## 2020-03-12 NOTE — Telephone Encounter (Signed)
Becky aware and apt made

## 2020-03-16 ENCOUNTER — Ambulatory Visit: Payer: Medicare Other | Admitting: Family Medicine

## 2020-03-19 ENCOUNTER — Other Ambulatory Visit: Payer: Self-pay

## 2020-03-19 ENCOUNTER — Other Ambulatory Visit: Payer: Medicare Other

## 2020-03-19 DIAGNOSIS — R443 Hallucinations, unspecified: Secondary | ICD-10-CM

## 2020-03-19 LAB — URINALYSIS, ROUTINE W REFLEX MICROSCOPIC
Bilirubin Urine: NEGATIVE
Glucose, UA: NEGATIVE
Hgb urine dipstick: NEGATIVE
Ketones, ur: NEGATIVE
Leukocytes,Ua: NEGATIVE
Nitrite: NEGATIVE
Protein, ur: NEGATIVE
Specific Gravity, Urine: 1.025 (ref 1.001–1.03)
pH: 5 (ref 5.0–8.0)

## 2020-04-01 ENCOUNTER — Telehealth: Payer: Self-pay | Admitting: Family Medicine

## 2020-04-01 NOTE — Telephone Encounter (Signed)
Becky called and would like something called in to help his sleep and calm down during the day as needed. She mentioned that she had some ativan she has been giving him and it seems to work well. Would like that and needs to know how often and how much to give him?

## 2020-04-02 ENCOUNTER — Other Ambulatory Visit: Payer: Self-pay | Admitting: Family Medicine

## 2020-04-02 MED ORDER — LORAZEPAM 0.5 MG PO TABS: 0.5000 mg | ORAL_TABLET | Freq: Three times a day (TID) | ORAL | 1 refills | Status: DC | PRN

## 2020-04-02 NOTE — Telephone Encounter (Signed)
Could do ativan 0.5 mg poq8 hrs prn.  I will send in.

## 2020-04-02 NOTE — Telephone Encounter (Signed)
Becky aware.

## 2020-05-05 IMAGING — US US EXTREM LOW VENOUS*R*
1 series · 13 of 24 positions shown · non-contrast
Comparison: None.

CLINICAL DATA: [AGE] male with right lower extremity edema



[Series 1: us extrem low venous*right* · 0.08mm/px · 13 of 38 slices shown]
[im 1/38]
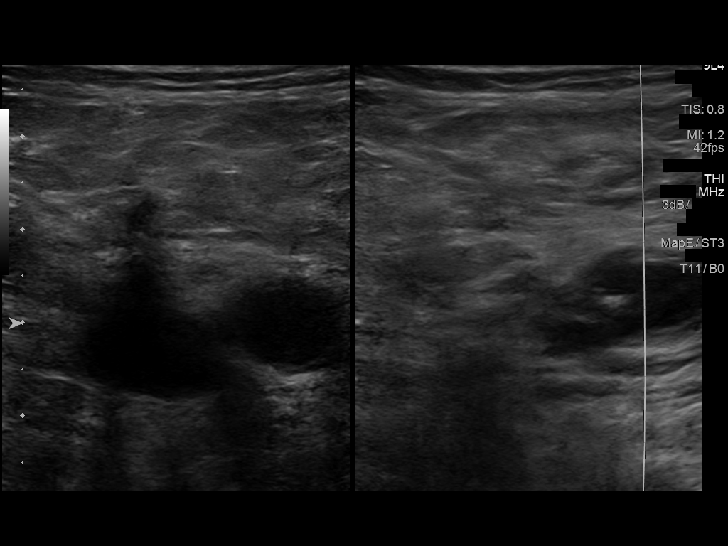
[im 4/38]
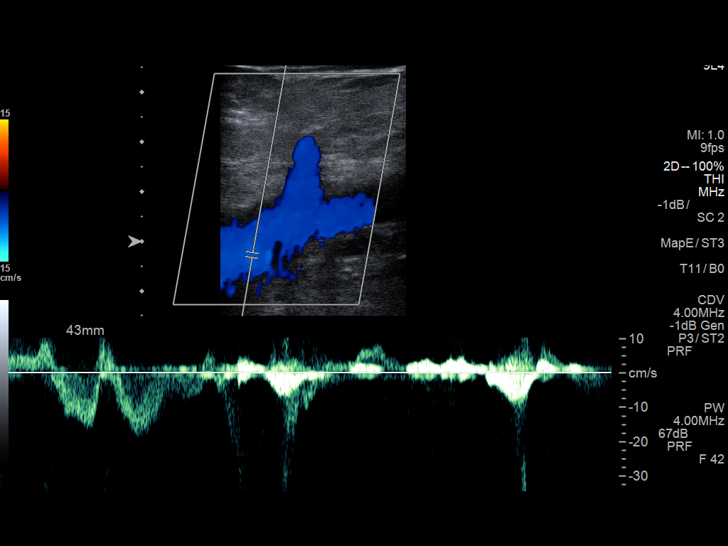
[im 7/38]
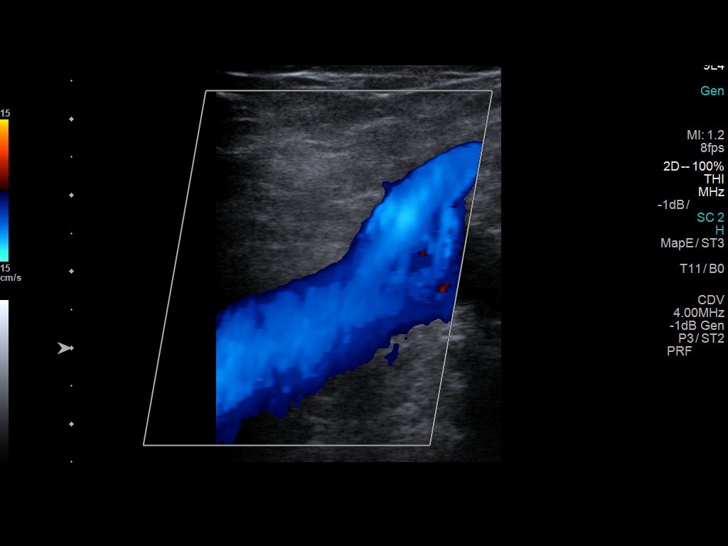
[im 10/38]
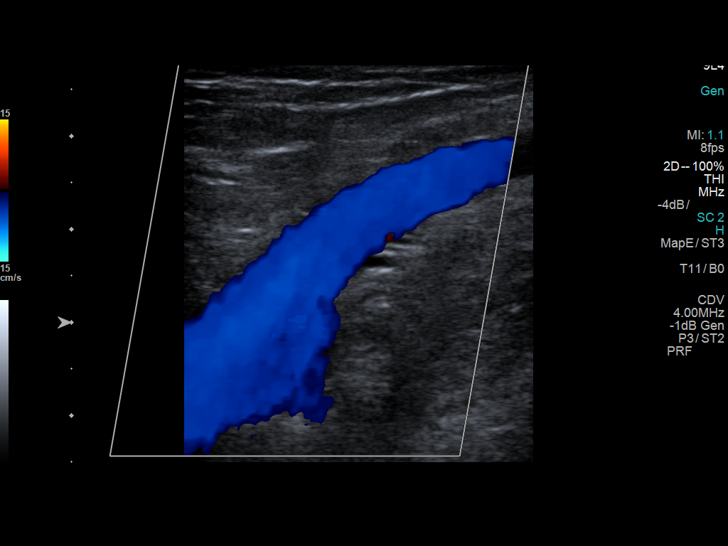
[im 13/38]
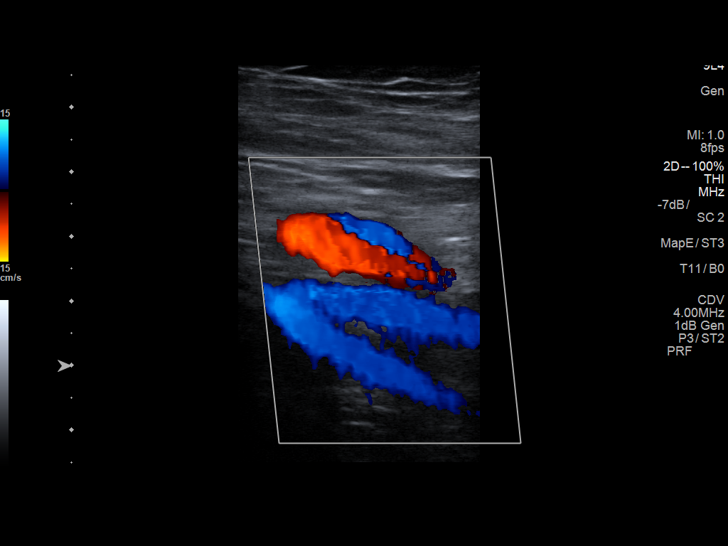
[im 17/38]
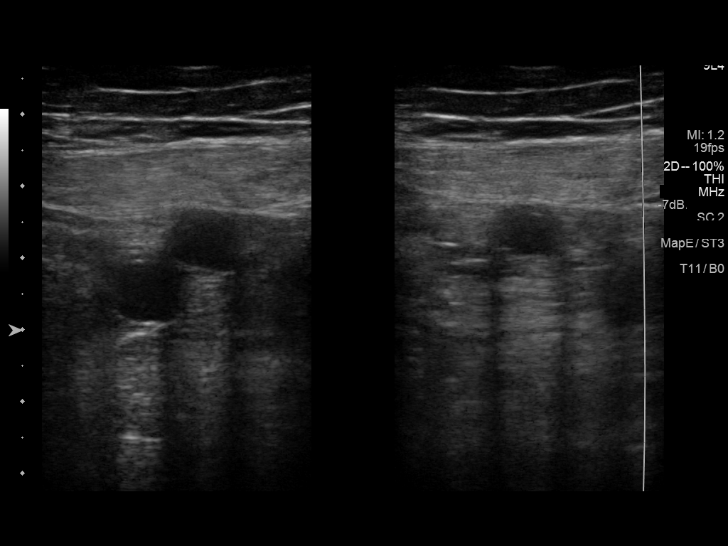
[im 20/38]
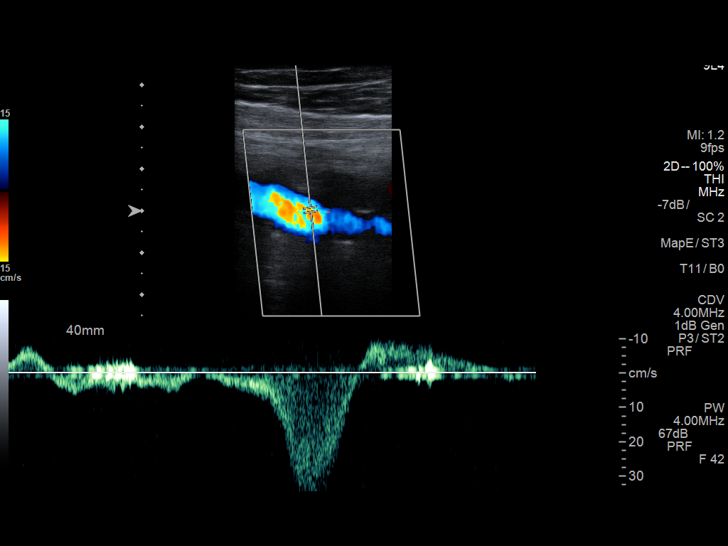
[im 21/38]
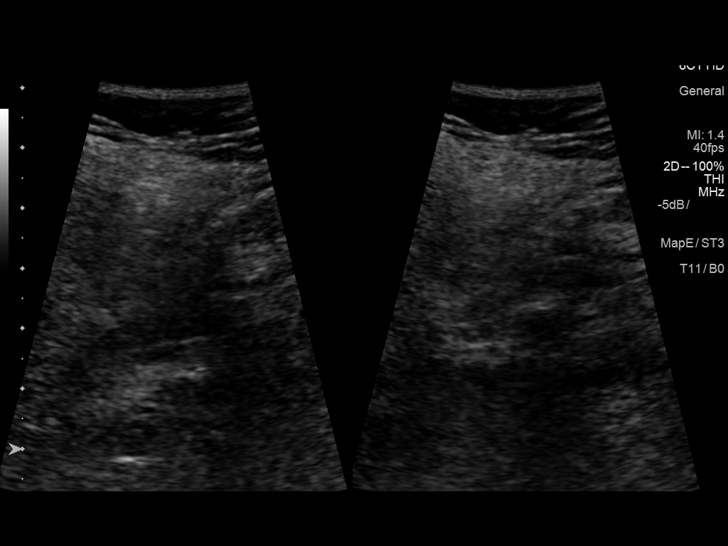
[im 25/38]
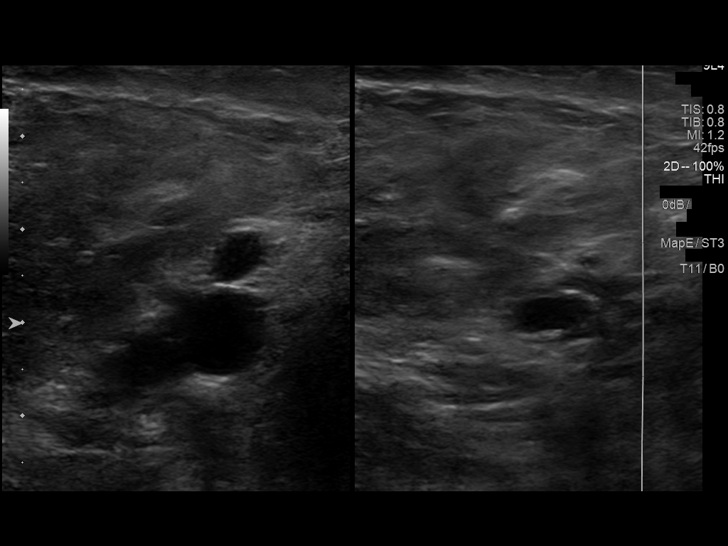
[im 28/38]
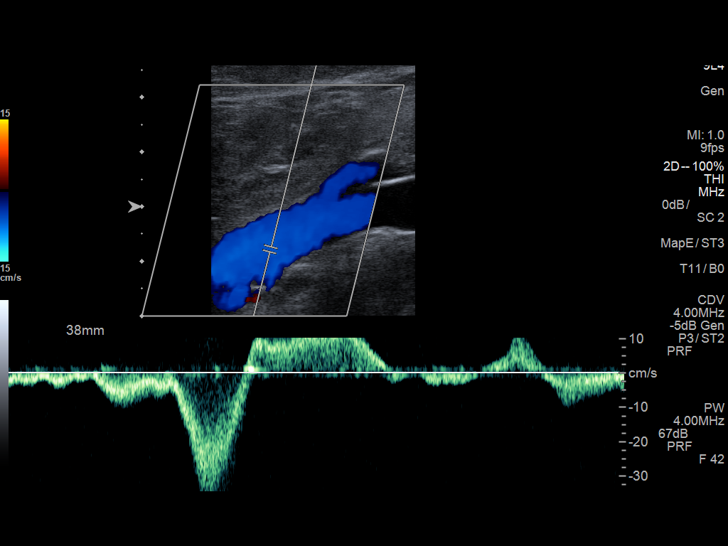
[im 31/38]
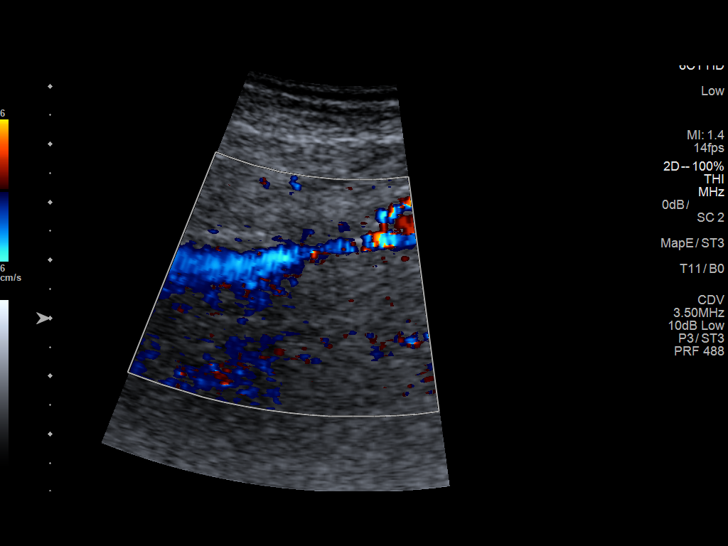
[im 34/38]
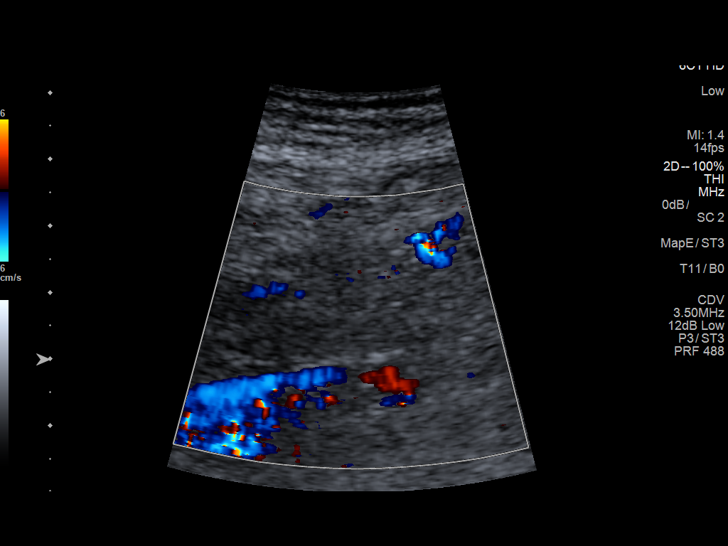
[im 38/38]
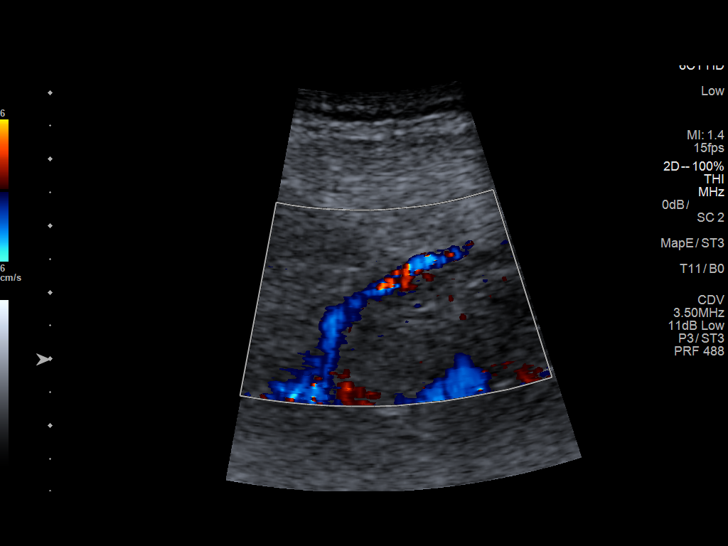

[13 of 24 positions shown; findings below may reference images not displayed]

FINDINGS: Contralateral Common Femoral Vein: Respiratory phasicity is normal
and symmetric with the symptomatic side. No evidence of thrombus.
Normal compressibility.

Common Femoral Vein: No evidence of thrombus. Normal
compressibility, respiratory phasicity and response to augmentation.

Saphenofemoral Junction: No evidence of thrombus. Normal
compressibility and flow on color Doppler imaging.

Profunda Femoral Vein: No evidence of thrombus. Normal
compressibility and flow on color Doppler imaging.

Femoral Vein: No evidence of thrombus. Normal compressibility,
respiratory phasicity and response to augmentation.

Popliteal Vein: No evidence of thrombus. Normal compressibility,
respiratory phasicity and response to augmentation.

Calf Veins: No evidence of thrombus. Normal compressibility and flow
on color Doppler imaging.

Superficial Great Saphenous Vein: No evidence of thrombus. Normal
compressibility.

Venous Reflux:  None.

Other Findings:  None.
IMPRESSION: No evidence of deep venous thrombosis.

## 2020-05-15 ENCOUNTER — Telehealth: Payer: Self-pay | Admitting: *Deleted

## 2020-05-15 ENCOUNTER — Ambulatory Visit: Payer: Medicare Other | Admitting: Family Medicine

## 2020-05-15 NOTE — Telephone Encounter (Signed)
Noted patient on fellow MD schedule for "not feeling well, won't eat". Was advised that patient son came to office to schedule appointment.   Call placed to patient daughter [name] to inquire. Reports that patient has been having increased grief reaction since wife passed in April. Reports that he voices C/O loss of appetite and loss of energy. Patient reports that he just doesn't feel like himself.   Also reports that patient would like to have PT come to home to assist him with strengthening and mobility. Reports that patient has some slight edema around his knees.   Advised that if patient is in crisis, take to ER for [facility] evaluation. Daughter reports that it has not gotten that bad yet.   Advised that patient would be better served to come in to see PCP as Dr Dennard Schaumann knows his baseline and has built a strong relationship with patent. Advised Dr Buelah Manis can see him for edema  And ordering [facility] services today if she would like to proceed. Appointment re-scheduled for Monday.

## 2020-05-15 NOTE — Telephone Encounter (Signed)
Noted  

## 2020-05-18 ENCOUNTER — Ambulatory Visit (INDEPENDENT_AMBULATORY_CARE_PROVIDER_SITE_OTHER): Payer: Medicare Other | Admitting: Family Medicine

## 2020-05-18 ENCOUNTER — Other Ambulatory Visit: Payer: Self-pay

## 2020-05-18 VITALS — BP 170/100 | HR 70 | Temp 96.9°F | Ht 73.0 in | Wt 234.0 lb

## 2020-05-18 DIAGNOSIS — F4320 Adjustment disorder, unspecified: Secondary | ICD-10-CM | POA: Diagnosis not present

## 2020-05-18 DIAGNOSIS — E038 Other specified hypothyroidism: Secondary | ICD-10-CM | POA: Diagnosis not present

## 2020-05-18 DIAGNOSIS — R531 Weakness: Secondary | ICD-10-CM | POA: Diagnosis not present

## 2020-05-18 DIAGNOSIS — I442 Atrioventricular block, complete: Secondary | ICD-10-CM | POA: Diagnosis not present

## 2020-05-18 MED ORDER — ESCITALOPRAM OXALATE 10 MG PO TABS: 10.0000 mg | ORAL_TABLET | Freq: Every day | ORAL | 3 refills | Status: DC

## 2020-05-18 NOTE — Progress Notes (Addendum)
Subjective:    Patient ID: [name], male    DOB: Oct 28, 1919, 86 y.o.   MRN: [id number]  HPI Patient is a very pleasant 86 year old Caucasian male who presents today with his daughter.  His wife recently passed away.  Since that time, the patient has been extremely upset.  He has very little appetite.  He reports feeling early satiety.  He also reports increasing anxiety.  He reports grieving and sadness.  He reports depression.  At times he feels extremely anxious and his blood pressure will shoot up causing him to have a headache and feel dizzy.  They have been treating his dizziness with meclizine which seems to help relax him.  He denies any abdominal pain or melena or hematochezia.  He denies any fevers or chills.  He denies any chest pain or shortness of breath or dyspnea on exertion.  He is extremely sedentary and is confined essentially to a wheelchair due to weakness in his legs.  Family is requesting physical therapy at home as he is extremely high fall risk. Past Medical History:  Diagnosis Date  . Acid reflux   . Cancer ([place]) 09/05/13   left neck-non-hodgkins lymphoma  . Hypertension    requires diuretic  . Hypothyroidism   . Malaria    while in the service  . Peripheral vascular disease ([place])    slow wound healing on legs   Past Surgical History:  Procedure Laterality Date  . AMPUTATION Left 06/25/2015   Procedure: LEFT  HALLUX AMPUTATION,;  Surgeon: Wylene Simmer, MD;  Location: [place];  Service: Orthopedics;  Laterality: Left;  LOCAL/MAC  . CHOLECYSTECTOMY N/A 11/08/2018   Procedure: LAPAROSCOPIC CHOLECYSTECTOMY WITH INTRAOPERATIVE CHOLANGIOGRAM;  Surgeon: [name], MD;  [facility];  Service: General;  Laterality: N/A;  . MASS BIOPSY Left 09/05/2013   Procedure: EXCISIONAL BIOPSY OF LEFT NECK MASS;  Surgeon: Jerrell Belfast, MD;  Location: [place];  Service: ENT;  Laterality: Left;  . TONSILLECTOMY    . YAG LASER APPLICATION Right 05/04/3709    Procedure: YAG LASER APPLICATION;  Surgeon: [name], MD;  [facility];  Service: Ophthalmology;  Laterality: Right;   Current Outpatient Medications on File Prior to Visit  Medication Sig Dispense Refill  . amLODipine (NORVASC) 5 MG tablet TAKE 1 TABLET BY MOUTH  DAILY 90 tablet 3  . aspirin EC 81 MG tablet Take 1 tablet (81 mg total) by mouth daily.    . enalapril (VASOTEC) 20 MG tablet TAKE 1 TABLET BY MOUTH  DAILY 90 tablet 3  . furosemide (LASIX) 20 MG tablet TAKE 2 TABLETS BY MOUTH  EVERY DAY 180 tablet 3  . levothyroxine (SYNTHROID) 112 MCG tablet TAKE 1 TABLET BY MOUTH  DAILY 90 tablet 3  . LORazepam (ATIVAN) 0.5 MG tablet Take 1 tablet (0.5 mg total) by mouth every 8 (eight) hours as needed for anxiety. 60 tablet 1  . neomycin-colistin-hydrocortisone-thonzonium (CORTISPORIN-TC) 3.01-28-09-0.5 MG/ML OTIC suspension Place 4 drops into the right ear 4 (four) times daily. 10 mL 0  . vitamin B-12 (CYANOCOBALAMIN) 1000 MCG tablet Take 1 tablet (1,000 mcg total) by mouth daily. 30 tablet 5  . betamethasone dipropionate 0.05 % cream APPLY TO AFFECTED AREA TWICE A DAY (Patient not taking: Reported on 05/18/2020) 45 g 1  . HYDROcodone-acetaminophen (NORCO) 5-325 MG tablet Take 1 tablet by mouth every 6 (six) hours as needed for moderate pain. (Patient not taking: Reported on 05/18/2020) 30 tablet 0  . tamsulosin (FLOMAX)  0.4 MG CAPS capsule Take 1 capsule (0.4 mg total) by mouth daily. (Patient not taking: Reported on 05/18/2020) 90 capsule 1  . [DISCONTINUED] famotidine (PEPCID) 20 MG tablet Take 20 mg by mouth 2 (two) times daily.     No current facility-administered medications on file prior to visit.   Allergies  Allergen Reactions  . Codeine Nausea And Vomiting  . Hydrocodone Nausea And Vomiting  . Percocet [Oxycodone-Acetaminophen] Nausea And Vomiting   Social History   Socioeconomic History  . Marital status: Married    Spouse name: Not on file  . Number of children: 4  .  Years of education: Not on file  . Highest education level: Not on file  Occupational History  . Occupation: retired  Tobacco Use  . Smoking status: Former Smoker    Types: Cigars  . Smokeless tobacco: Former Systems developer    Quit date: 10/05/1979  Substance and Sexual Activity  . Alcohol use: No  . Drug use: No  . Sexual activity: Not on file  Other Topics Concern  . Not on file  Social History Narrative  . Not on file   Social Determinants of Health   Financial Resource Strain:   . Difficulty of Paying Living Expenses:   Food Insecurity:   . Worried About Charity fundraiser in the Last Year:   . Arboriculturist in the Last Year:   Transportation Needs:   . Film/video editor (Medical):   Marland Kitchen Lack of Transportation (Non-Medical):   Physical Activity:   . Days of Exercise per Week:   . Minutes of Exercise per Session:   Stress:   . Feeling of Stress :   Social Connections:   . Frequency of Communication with Friends and Family:   . Frequency of Social Gatherings with Friends and Family:   . Attends Religious Services:   . Active Member of Clubs or Organizations:   . Attends Archivist Meetings:   Marland Kitchen Marital Status:   Intimate Partner Violence:   . Fear of Current or Ex-Partner:   . Emotionally Abused:   Marland Kitchen Physically Abused:   . Sexually Abused:       Review of Systems  Neurological: Positive for dizziness.  All other systems reviewed and are negative.      Objective:   Physical Exam Vitals reviewed.  Constitutional:      General: He is not in acute distress.    Appearance: He is well-developed. He is not diaphoretic.  HENT:     Right Ear: External ear normal.     Left Ear: External ear normal.     Mouth/Throat:     Pharynx: No oropharyngeal exudate.  Cardiovascular:     Rate and Rhythm: Normal rate and regular rhythm.     Heart sounds: Normal heart sounds. No murmur heard.  No friction rub. No gallop.   Pulmonary:     Effort: Pulmonary effort  is normal. No respiratory distress.     Breath sounds: Normal breath sounds. No wheezing or rales.  Chest:     Chest wall: No tenderness.  Abdominal:     General: Bowel sounds are normal. There is no distension.     Palpations: Abdomen is soft.     Tenderness: There is no abdominal tenderness. There is no guarding or rebound.  Musculoskeletal:     Lumbar back: No spasms, tenderness or bony tenderness. Normal range of motion.     Right hip: No tenderness. Normal range  of motion. Decreased strength.     Left hip: No tenderness. Normal range of motion. Decreased strength.           Assessment & Plan:  Other specified hypothyroidism - Plan: CBC with Differential/Platelet, COMPLETE METABOLIC PANEL WITH GFR, TSH  Abnormal grief reaction  Weakness - Plan: Ambulatory referral to [facility] will check baseline lab work to rule out other potential causes of weakness including a CBC, CMP, and TSH particular given his history of hypothyroidism.  However I believe that his early satiety, fatigue, depression, and extreme sadness are due to his grieving the death of his wife.  We will try adding Lexapro 10 mg a day.  He is already using Ativan at home sparingly for panic and anxiety which seems to be helping.  I will consult home health for physical therapy at home to try to improve balance and to facilitate transfers and reduce his risk of falls.  Reassess in 2 to 3 weeks.  Increase amlodipine to 10 mg given his elevated blood pressure. 06/04/20 Recently was found to have heart block requiring pacemaker adding to his weakness.  Was admitted to the hospital for this and received a pacemaker.  Would benefit from PT.

## 2020-05-19 LAB — CBC WITH DIFFERENTIAL/PLATELET
Absolute Monocytes: 485 cells/uL (ref 200–950)
Basophils Absolute: 29 cells/uL (ref 0–200)
Basophils Relative: 0.5 %
Eosinophils Absolute: 63 cells/uL (ref 15–500)
Eosinophils Relative: 1.1 %
HCT: 41.5 % (ref 38.5–50.0)
Hemoglobin: 14.1 g/dL (ref 13.2–17.1)
Lymphs Abs: 1818 cells/uL (ref 850–3900)
MCH: 32.4 pg (ref 27.0–33.0)
MCHC: 34 g/dL (ref 32.0–36.0)
MCV: 95.4 fL (ref 80.0–100.0)
MPV: 10.7 fL (ref 7.5–12.5)
Monocytes Relative: 8.5 %
Neutro Abs: 3306 cells/uL (ref 1500–7800)
Neutrophils Relative %: 58 %
Platelets: 163 10*3/uL (ref 140–400)
RBC: 4.35 10*6/uL (ref 4.20–5.80)
RDW: 12.4 % (ref 11.0–15.0)
Total Lymphocyte: 31.9 %
WBC: 5.7 10*3/uL (ref 3.8–10.8)

## 2020-05-19 LAB — COMPLETE METABOLIC PANEL WITH GFR
AG Ratio: 1.6 (calc) (ref 1.0–2.5)
ALT: 9 U/L (ref 9–46)
AST: 19 U/L (ref 10–35)
Albumin: 4.7 g/dL (ref 3.6–5.1)
Alkaline phosphatase (APISO): 55 U/L (ref 35–144)
BUN/Creatinine Ratio: 15 (calc) (ref 6–22)
BUN: 24 mg/dL (ref 7–25)
CO2: 24 mmol/L (ref 20–32)
Calcium: 10.2 mg/dL (ref 8.6–10.3)
Chloride: 103 mmol/L (ref 98–110)
Creat: 1.62 mg/dL — ABNORMAL HIGH (ref 0.70–1.11)
GFR, Est African American: 40 mL/min/{1.73_m2} — ABNORMAL LOW (ref >=60)
GFR, Est Non African American: 35 mL/min/{1.73_m2} — ABNORMAL LOW (ref >=60)
Globulin: 2.9 g/dL (calc) (ref 1.9–3.7)
Glucose, Bld: 101 mg/dL — ABNORMAL HIGH (ref 65–99)
Potassium: 5.4 mmol/L — ABNORMAL HIGH (ref 3.5–5.3)
Sodium: 142 mmol/L (ref 135–146)
Total Bilirubin: 0.6 mg/dL (ref 0.2–1.2)
Total Protein: 7.6 g/dL (ref 6.1–8.1)

## 2020-05-19 LAB — TSH: TSH: 2.59 mIU/L (ref 0.40–4.50)

## 2020-05-27 ENCOUNTER — Emergency Department (HOSPITAL_BASED_OUTPATIENT_CLINIC_OR_DEPARTMENT_OTHER): Payer: Medicare Other

## 2020-05-27 ENCOUNTER — Inpatient Hospital Stay (HOSPITAL_BASED_OUTPATIENT_CLINIC_OR_DEPARTMENT_OTHER)
Admission: EM | Admit: 2020-05-27 | Discharge: 2020-05-29 | DRG: 244 | Disposition: A | Discharge status: Home / Self Care | Payer: Medicare Other | Attending: Internal Medicine | Admitting: Internal Medicine

## 2020-05-27 ENCOUNTER — Other Ambulatory Visit: Payer: Self-pay

## 2020-05-27 ENCOUNTER — Encounter (HOSPITAL_BASED_OUTPATIENT_CLINIC_OR_DEPARTMENT_OTHER): Payer: Self-pay

## 2020-05-27 DIAGNOSIS — Z993 Dependence on wheelchair: Secondary | ICD-10-CM | POA: Diagnosis not present

## 2020-05-27 DIAGNOSIS — K219 Gastro-esophageal reflux disease without esophagitis: Secondary | ICD-10-CM | POA: Diagnosis present

## 2020-05-27 DIAGNOSIS — I459 Conduction disorder, unspecified: Secondary | ICD-10-CM | POA: Diagnosis not present

## 2020-05-27 DIAGNOSIS — I442 Atrioventricular block, complete: Secondary | ICD-10-CM | POA: Diagnosis not present

## 2020-05-27 DIAGNOSIS — E039 Hypothyroidism, unspecified: Secondary | ICD-10-CM | POA: Diagnosis not present

## 2020-05-27 DIAGNOSIS — I129 Hypertensive chronic kidney disease with stage 1 through stage 4 chronic kidney disease, or unspecified chronic kidney disease: Secondary | ICD-10-CM | POA: Diagnosis not present

## 2020-05-27 DIAGNOSIS — Y92002 Bathroom of unspecified non-institutional (private) residence single-family (private) house as the place of occurrence of the external cause: Secondary | ICD-10-CM

## 2020-05-27 DIAGNOSIS — Z79899 Other long term (current) drug therapy: Secondary | ICD-10-CM

## 2020-05-27 DIAGNOSIS — I6523 Occlusion and stenosis of bilateral carotid arteries: Secondary | ICD-10-CM | POA: Diagnosis not present

## 2020-05-27 DIAGNOSIS — I517 Cardiomegaly: Secondary | ICD-10-CM | POA: Diagnosis not present

## 2020-05-27 DIAGNOSIS — I872 Venous insufficiency (chronic) (peripheral): Secondary | ICD-10-CM | POA: Diagnosis present

## 2020-05-27 DIAGNOSIS — S8012XA Contusion of left lower leg, initial encounter: Secondary | ICD-10-CM | POA: Diagnosis not present

## 2020-05-27 DIAGNOSIS — Z79890 Hormone replacement therapy (postmenopausal): Secondary | ICD-10-CM

## 2020-05-27 DIAGNOSIS — Z801 Family history of malignant neoplasm of trachea, bronchus and lung: Secondary | ICD-10-CM

## 2020-05-27 DIAGNOSIS — S51812A Laceration without foreign body of left forearm, initial encounter: Secondary | ICD-10-CM | POA: Diagnosis present

## 2020-05-27 DIAGNOSIS — Z87891 Personal history of nicotine dependence: Secondary | ICD-10-CM

## 2020-05-27 DIAGNOSIS — R9431 Abnormal electrocardiogram [ECG] [EKG]: Secondary | ICD-10-CM | POA: Diagnosis not present

## 2020-05-27 DIAGNOSIS — R531 Weakness: Secondary | ICD-10-CM

## 2020-05-27 DIAGNOSIS — I441 Atrioventricular block, second degree: Secondary | ICD-10-CM | POA: Diagnosis not present

## 2020-05-27 DIAGNOSIS — I10 Essential (primary) hypertension: Secondary | ICD-10-CM | POA: Diagnosis present

## 2020-05-27 DIAGNOSIS — S40022A Contusion of left upper arm, initial encounter: Secondary | ICD-10-CM | POA: Diagnosis not present

## 2020-05-27 DIAGNOSIS — Z20822 Contact with and (suspected) exposure to covid-19: Secondary | ICD-10-CM | POA: Diagnosis not present

## 2020-05-27 DIAGNOSIS — Z8572 Personal history of non-Hodgkin lymphomas: Secondary | ICD-10-CM | POA: Diagnosis not present

## 2020-05-27 DIAGNOSIS — W19XXXA Unspecified fall, initial encounter: Secondary | ICD-10-CM | POA: Diagnosis present

## 2020-05-27 DIAGNOSIS — S0990XA Unspecified injury of head, initial encounter: Secondary | ICD-10-CM | POA: Diagnosis not present

## 2020-05-27 DIAGNOSIS — S8011XA Contusion of right lower leg, initial encounter: Secondary | ICD-10-CM | POA: Diagnosis not present

## 2020-05-27 DIAGNOSIS — Z7982 Long term (current) use of aspirin: Secondary | ICD-10-CM | POA: Diagnosis not present

## 2020-05-27 DIAGNOSIS — I7090 Unspecified atherosclerosis: Secondary | ICD-10-CM | POA: Diagnosis not present

## 2020-05-27 DIAGNOSIS — Z8249 Family history of ischemic heart disease and other diseases of the circulatory system: Secondary | ICD-10-CM | POA: Diagnosis not present

## 2020-05-27 DIAGNOSIS — W01198A Fall on same level from slipping, tripping and stumbling with subsequent striking against other object, initial encounter: Secondary | ICD-10-CM | POA: Diagnosis present

## 2020-05-27 DIAGNOSIS — Z823 Family history of stroke: Secondary | ICD-10-CM | POA: Diagnosis not present

## 2020-05-27 DIAGNOSIS — N1832 Chronic kidney disease, stage 3b: Secondary | ICD-10-CM | POA: Diagnosis present

## 2020-05-27 DIAGNOSIS — J9811 Atelectasis: Secondary | ICD-10-CM | POA: Diagnosis not present

## 2020-05-27 DIAGNOSIS — N184 Chronic kidney disease, stage 4 (severe): Secondary | ICD-10-CM | POA: Diagnosis present

## 2020-05-27 DIAGNOSIS — K449 Diaphragmatic hernia without obstruction or gangrene: Secondary | ICD-10-CM | POA: Diagnosis not present

## 2020-05-27 DIAGNOSIS — Z959 Presence of cardiac and vascular implant and graft, unspecified: Secondary | ICD-10-CM

## 2020-05-27 DIAGNOSIS — R9082 White matter disease, unspecified: Secondary | ICD-10-CM | POA: Diagnosis not present

## 2020-05-27 DIAGNOSIS — F4321 Adjustment disorder with depressed mood: Secondary | ICD-10-CM | POA: Diagnosis present

## 2020-05-27 DIAGNOSIS — N1831 Chronic kidney disease, stage 3a: Secondary | ICD-10-CM | POA: Diagnosis not present

## 2020-05-27 DIAGNOSIS — N183 Chronic kidney disease, stage 3 unspecified: Secondary | ICD-10-CM | POA: Diagnosis present

## 2020-05-27 DIAGNOSIS — S199XXA Unspecified injury of neck, initial encounter: Secondary | ICD-10-CM | POA: Diagnosis not present

## 2020-05-27 LAB — CBC WITH DIFFERENTIAL/PLATELET
Abs Immature Granulocytes: 0.02 10*3/uL (ref 0.00–0.07)
Basophils Absolute: 0 10*3/uL (ref 0.0–0.1)
Basophils Relative: 1 %
Eosinophils Absolute: 0 10*3/uL (ref 0.0–0.5)
Eosinophils Relative: 0 %
HCT: 43.3 % (ref 39.0–52.0)
Hemoglobin: 14.2 g/dL (ref 13.0–17.0)
Immature Granulocytes: 0 %
Lymphocytes Relative: 32 %
Lymphs Abs: 2.1 10*3/uL (ref 0.7–4.0)
MCH: 32.1 pg (ref 26.0–34.0)
MCHC: 32.8 g/dL (ref 30.0–36.0)
MCV: 97.7 fL (ref 80.0–100.0)
Monocytes Absolute: 0.6 10*3/uL (ref 0.1–1.0)
Monocytes Relative: 9 %
Neutro Abs: 3.8 10*3/uL (ref 1.7–7.7)
Neutrophils Relative %: 58 %
Platelets: 182 10*3/uL (ref 150–400)
RBC: 4.43 MIL/uL (ref 4.22–5.81)
RDW: 13.1 % (ref 11.5–15.5)
WBC: 6.5 10*3/uL (ref 4.0–10.5)
nRBC: 0 % (ref 0.0–0.2)

## 2020-05-27 LAB — COMPREHENSIVE METABOLIC PANEL
ALT: 14 U/L (ref 0–44)
AST: 25 U/L (ref 15–41)
Albumin: 4.8 g/dL (ref 3.5–5.0)
Alkaline Phosphatase: 51 U/L (ref 38–126)
Anion gap: 12 (ref 5–15)
BUN: 24 mg/dL — ABNORMAL HIGH (ref 8–23)
CO2: 25 mmol/L (ref 22–32)
Calcium: 9.6 mg/dL (ref 8.9–10.3)
Chloride: 100 mmol/L (ref 98–111)
Creatinine, Ser: 1.35 mg/dL — ABNORMAL HIGH (ref 0.61–1.24)
GFR calc Af Amer: 50 mL/min — ABNORMAL LOW (ref >60)
GFR calc non Af Amer: 43 mL/min — ABNORMAL LOW (ref >60)
Glucose, Bld: 111 mg/dL — ABNORMAL HIGH (ref 70–99)
Potassium: 4.9 mmol/L (ref 3.5–5.1)
Sodium: 137 mmol/L (ref 135–145)
Total Bilirubin: 1 mg/dL (ref 0.3–1.2)
Total Protein: 8.1 g/dL (ref 6.5–8.1)

## 2020-05-27 LAB — SARS CORONAVIRUS 2 BY RT PCR (HOSPITAL ORDER, PERFORMED IN ~~LOC~~ HOSPITAL LAB): SARS Coronavirus 2: NEGATIVE

## 2020-05-27 LAB — URINALYSIS, MICROSCOPIC (REFLEX)

## 2020-05-27 LAB — URINALYSIS, ROUTINE W REFLEX MICROSCOPIC
Bilirubin Urine: NEGATIVE
Glucose, UA: NEGATIVE mg/dL
Ketones, ur: NEGATIVE mg/dL
Leukocytes,Ua: NEGATIVE
Nitrite: NEGATIVE
Protein, ur: NEGATIVE mg/dL
Specific Gravity, Urine: 1.01 (ref 1.005–1.030)
pH: 6 (ref 5.0–8.0)

## 2020-05-27 MED ORDER — AMLODIPINE BESYLATE 5 MG PO TABS
5.0000 mg | ORAL_TABLET | Freq: Every day | ORAL | Status: DC
  Administered 2020-05-28 – 2020-05-29 (×2): 5 mg via ORAL
  Filled 2020-05-27 (×3): qty 1

## 2020-05-27 MED ORDER — ESCITALOPRAM OXALATE 10 MG PO TABS
10.0000 mg | ORAL_TABLET | Freq: Every day | ORAL | Status: DC
  Administered 2020-05-28 – 2020-05-29 (×2): 10 mg via ORAL
  Filled 2020-05-27 (×2): qty 1

## 2020-05-27 MED ORDER — VITAMIN B-12 1000 MCG PO TABS
1000.0000 ug | ORAL_TABLET | Freq: Every day | ORAL | Status: DC
  Administered 2020-05-28 – 2020-05-29 (×2): 1000 ug via ORAL
  Filled 2020-05-27 (×2): qty 1

## 2020-05-27 MED ORDER — ENSURE ENLIVE PO LIQD
237.0000 mL | Freq: Two times a day (BID) | ORAL | Status: DC
  Administered 2020-05-29: 237 mL via ORAL

## 2020-05-27 MED ORDER — LORAZEPAM 0.5 MG PO TABS
0.5000 mg | ORAL_TABLET | Freq: Every day | ORAL | Status: DC
  Administered 2020-05-27 – 2020-05-28 (×2): 0.5 mg via ORAL
  Filled 2020-05-27 (×2): qty 1

## 2020-05-27 MED ORDER — LEVOTHYROXINE SODIUM 112 MCG PO TABS
112.0000 ug | ORAL_TABLET | Freq: Every day | ORAL | Status: DC
  Administered 2020-05-28 – 2020-05-29 (×2): 112 ug via ORAL
  Filled 2020-05-27 (×2): qty 1

## 2020-05-27 MED ORDER — DIPHENHYDRAMINE HCL 50 MG/ML IJ SOLN
25.0000 mg | Freq: Once | INTRAMUSCULAR | Status: AC
  Administered 2020-05-27: 25 mg via INTRAVENOUS
  Filled 2020-05-27: qty 1

## 2020-05-27 MED ORDER — POLYETHYLENE GLYCOL 3350 17 G PO PACK: 17.0000 g | PACK | Freq: Every day | ORAL | Status: DC | PRN

## 2020-05-27 MED ORDER — METOCLOPRAMIDE HCL 5 MG/ML IJ SOLN
10.0000 mg | Freq: Once | INTRAMUSCULAR | Status: AC
  Administered 2020-05-27: 10 mg via INTRAVENOUS
  Filled 2020-05-27: qty 2

## 2020-05-27 MED ORDER — FUROSEMIDE 40 MG PO TABS
40.0000 mg | ORAL_TABLET | Freq: Every day | ORAL | Status: DC
  Administered 2020-05-28: 40 mg via ORAL
  Filled 2020-05-27: qty 1

## 2020-05-27 MED ORDER — LORAZEPAM 1 MG PO TABS
0.5000 mg | ORAL_TABLET | Freq: Once | ORAL | Status: AC
  Administered 2020-05-27: 0.5 mg via ORAL
  Filled 2020-05-27: qty 1

## 2020-05-27 MED ORDER — DOCUSATE SODIUM 100 MG PO CAPS: 100.0000 mg | ORAL_CAPSULE | Freq: Two times a day (BID) | ORAL | Status: DC | PRN

## 2020-05-27 MED ORDER — SODIUM CHLORIDE 0.9 % IV BOLUS
500.0000 mL | Freq: Once | INTRAVENOUS | Status: AC
  Administered 2020-05-27: 500 mL via INTRAVENOUS

## 2020-05-27 MED ORDER — ENALAPRIL MALEATE 20 MG PO TABS
20.0000 mg | ORAL_TABLET | Freq: Every day | ORAL | Status: DC
  Administered 2020-05-28 – 2020-05-29 (×2): 20 mg via ORAL
  Filled 2020-05-27 (×2): qty 1

## 2020-05-27 NOTE — ED Provider Notes (Signed)
[facility]ERGENCY DEPARTMENT Provider Note   CSN: [id number] Arrival date & time: 05/27/20  1201     History Chief Complaint  Patient presents with  . Weakness  . Fall    [name] is a 86 y.o. male.  86 y.o male with a PMH of Acid Reflux, CKD, HTN, Malaria presents to the ED presents to the ED brought in by daughter [name] s/p fall. Patient's daughter is providing most of the history, reports patient was placed 2 weeks ago was diagnosed with depression palced on Lexapro by PCP after the death of his wife.  Ever since patient has had some decrease in appetite, has not been eating or drinking, has been weaker per daughter.  Patient reports he had blood work done couple weeks ago, reports he was diagnosed with dehydration.  Patient is currently living alone but has his kids check up on him.  Reports today, he was being checked on by son, they heard a loud noise in the bathroom, found patient had fallen on the shower, states he struck his head on the bathroom shelf, was laying there backwards.  Patient endorses pain along his head which she has had for the past 3 weeks, also reports pain along his neck.  He is currently not on any blood thinners.  Patient does not complain of any pain.  He is alert and oriented x4.  The history is provided by the patient and a relative.  Weakness Associated symptoms: headaches   Associated symptoms: no abdominal pain, no chest pain, no fever, no nausea, no shortness of breath and no vomiting   Fall Associated symptoms include headaches. Pertinent negatives include no chest pain, no abdominal pain and no shortness of breath.       Past Medical History:  Diagnosis Date  . Acid reflux   . Cancer ([place]) 09/05/13   left neck-non-hodgkins lymphoma  . Hypertension    requires diuretic  . Hypothyroidism   . Malaria    while in the service  . Peripheral vascular disease ([place])    slow wound healing on legs    Patient Active Problem List    Diagnosis Date Noted  . Cholecystitis, acute 11/07/2018  . Gallstone 11/07/2018  . Hypothyroidism 11/07/2018  . CKD (chronic kidney disease) stage 3, GFR 30-59 ml/min 11/07/2018  . Acute cholecystitis 11/07/2018  . Abnormal liver function 11/07/2018  . NHL (non-Hodgkin's lymphoma) ([place]) 11/07/2018  . Cellulitis 01/18/2017  . Venous stasis dermatitis 05/28/2015  . Wound, open, toe 05/28/2015  . Insomnia 05/28/2015  . Cancer ([place]) 09/05/2013  . Lymphadenopathy, cervical 08/29/2013    Class: Chronic  . Acid reflux   . Hypertension   . Peripheral vascular disease [facility])     Past Surgical History:  Procedure Laterality Date  . AMPUTATION Left 06/25/2015   Procedure: LEFT  HALLUX AMPUTATION,;  Surgeon: Wylene Simmer, MD;  Location: [facility];  Service: Orthopedics;  Laterality: Left;  LOCAL/MAC  . CHOLECYSTECTOMY N/A 11/08/2018   Procedure: LAPAROSCOPIC CHOLECYSTECTOMY WITH INTRAOPERATIVE CHOLANGIOGRAM;  Surgeon: [name], MD;  Location: [facility];  Service: General;  Laterality: N/A;  . MASS BIOPSY Left 09/05/2013   Procedure: EXCISIONAL BIOPSY OF LEFT NECK MASS;  Surgeon: Jerrell Belfast, MD;  Location: [place];  Service: ENT;  Laterality: Left;  . TONSILLECTOMY    . YAG LASER APPLICATION Right 2/45/8099   Procedure: YAG LASER APPLICATION;  Surgeon: [name], MD;  Location: [facility];  Service: Ophthalmology;  Laterality: Right;  Family History  Problem Relation Age of Onset  . Lung cancer Brother   . Heart disease Brother   . Stroke Brother     Social History   Tobacco Use  . Smoking status: Former Smoker    Types: Cigars  . Smokeless tobacco: Former Systems developer    Quit date: 10/05/1979  Substance Use Topics  . Alcohol use: No  . Drug use: No    Home Medications Prior to Admission medications   Medication Sig Start Date End Date Taking? Authorizing Provider  amLODipine (NORVASC) 5 MG tablet TAKE 1 TABLET BY MOUTH  DAILY 04/15/19   Susy Frizzle, MD    aspirin EC 81 MG tablet Take 1 tablet (81 mg total) by mouth daily. 09/05/13   Jerrell Belfast, MD  betamethasone dipropionate 0.05 % cream APPLY TO AFFECTED AREA TWICE A DAY Patient not taking: Reported on 05/18/2020 10/28/19   Susy Frizzle, MD  enalapril (VASOTEC) 20 MG tablet TAKE 1 TABLET BY MOUTH  DAILY 11/11/19   Susy Frizzle, MD  escitalopram (LEXAPRO) 10 MG tablet Take 1 tablet (10 mg total) by mouth daily. 05/18/20   Susy Frizzle, MD  furosemide (LASIX) 20 MG tablet TAKE 2 TABLETS BY MOUTH  EVERY DAY 12/09/19   Susy Frizzle, MD  HYDROcodone-acetaminophen (NORCO) 5-325 MG tablet Take 1 tablet by mouth every 6 (six) hours as needed for moderate pain. Patient not taking: Reported on 05/18/2020 01/18/19   Susy Frizzle, MD  levothyroxine (SYNTHROID) 112 MCG tablet TAKE 1 TABLET BY MOUTH  DAILY 04/15/19   Susy Frizzle, MD  LORazepam (ATIVAN) 0.5 MG tablet Take 1 tablet (0.5 mg total) by mouth every 8 (eight) hours as needed for anxiety. 04/02/20   Susy Frizzle, MD  neomycin-colistin-hydrocortisone-thonzonium (CORTISPORIN-TC) 3.01-28-09-0.5 MG/ML OTIC suspension Place 4 drops into the right ear 4 (four) times daily. 02/05/19   Susy Frizzle, MD  tamsulosin (FLOMAX) 0.4 MG CAPS capsule Take 1 capsule (0.4 mg total) by mouth daily. Patient not taking: Reported on 05/18/2020 01/07/19   Susy Frizzle, MD  vitamin B-12 (CYANOCOBALAMIN) 1000 MCG tablet Take 1 tablet (1,000 mcg total) by mouth daily. 05/01/15   Susy Frizzle, MD  famotidine (PEPCID) 20 MG tablet Take 20 mg by mouth 2 (two) times daily.  11/29/18  [provider]    Allergies    Codeine and Percocet [oxycodone-acetaminophen]  Review of Systems   Review of Systems  Constitutional: Negative for chills and fever.  HENT: Negative for sore throat.   Respiratory: Negative for shortness of breath.   Cardiovascular: Negative for chest pain.  Gastrointestinal: Negative for abdominal pain, nausea  and vomiting.  Genitourinary: Negative for flank pain.  Musculoskeletal: Positive for neck pain.  Neurological: Positive for weakness and headaches.  All other systems reviewed and are negative.   Physical Exam Updated Vital Signs BP (!) 180/71   Pulse 73   Temp 98.8 F (37.1 C) (Oral)   Resp 17   Ht _0  (1.854 m)   Wt 106.6 kg   SpO2 97%   BMI 31.00 kg/m   Physical Exam Vitals and nursing note reviewed.  Constitutional:      Appearance: Normal appearance.  HENT:     Head: Normocephalic.      Mouth/Throat:     Mouth: Mucous membranes are moist.  Eyes:     Pupils: Pupils are equal, round, and reactive to light.  Cardiovascular:     Rate and  Rhythm: Normal rate.  Pulmonary:     Effort: Pulmonary effort is normal.     Breath sounds: No wheezing.  Abdominal:     General: Abdomen is flat.     Tenderness: There is no abdominal tenderness. There is no right CVA tenderness or left CVA tenderness.  Musculoskeletal:     Cervical back: Tenderness present.  Skin:    General: Skin is warm and dry.     Findings: Abrasion, bruising and laceration present.          Comments: 1 cm skin tear to the left forearm.   Neurological:     Mental Status: He is alert and oriented to person, place, and time.     Comments: Alert, oriented, thought content appropriate. Speech fluent without evidence of aphasia. Able to follow 2 step commands without difficulty.  Cranial Nerves:  II:  Peripheral visual fields grossly normal, pupils, round, reactive to light III,IV, VI: ptosis not present, extra-ocular motions intact bilaterally  V,VII: smile symmetric, facial light touch sensation equal VIII: hearing grossly normal bilaterally  IX,X: midline uvula rise  XI: bilateral shoulder shrug equal and strong XII: midline tongue extension  Motor:  5/5 in upper and lower extremities bilaterally including strong and equal grip strength and dorsiflexion/plantar flexion Sensory: light touch normal in  all extremities.  Cerebellar: normal finger-to-nose with bilateral upper extremities, pronator drift negative      ED Results / Procedures / Treatments   Labs (all labs ordered are listed, but only abnormal results are displayed) Labs Reviewed  URINALYSIS, ROUTINE W REFLEX MICROSCOPIC - Abnormal; Notable for the following components:      Result Value   Hgb urine dipstick MODERATE (*)    All other components within normal limits  COMPREHENSIVE METABOLIC PANEL - Abnormal; Notable for the following components:   Glucose, Bld 111 (*)    BUN 24 (*)    Creatinine, Ser 1.35 (*)    GFR calc non Af Amer 43 (*)    GFR calc Af Amer 50 (*)    All other components within normal limits  URINALYSIS, MICROSCOPIC (REFLEX) - Abnormal; Notable for the following components:   Bacteria, UA RARE (*)    All other components within normal limits  SARS CORONAVIRUS 2 BY RT PCR (HOSPITAL ORDER, [place] [facility])  CBC WITH DIFFERENTIAL/PLATELET    EKG EKG Interpretation  Date/Time:  Wednesday May 27 2020 14:34:37 EDT Ventricular Rate:  48 PR Interval:    QRS Duration: 119 QT Interval:  483 QTC Calculation: 374 R Axis:   -88 Text Interpretation: Second degree AV block, Mobitz II Atrial premature complexes in couplets Incomplete RBBB and LAFB ST elevation, consider lateral injury type 2 block new since previous, poor baseline, will repeat Confirmed by Wandra Arthurs ([id number]) on 05/27/2020 3:48:07 PM   Radiology DG Chest 2 View  Result Date: 05/27/2020 CLINICAL DATA:  [place] today after losing balance. EXAM: CHEST - 2 VIEW COMPARISON:  11/06/2018 FINDINGS: Lordotic positioning. Heart size is normal. Hiatal hernia. Aortic atherosclerosis. The lungs are clear. No pneumothorax or hemothorax. No visible rib fracture. Chronic thoracic kyphotic curvature. IMPRESSION: No acute or traumatic finding. Kyphotic curvature resulting in lordotic positioning. Hiatal hernia. Aortic atherosclerosis.  Electronically Signed   By: Nelson Chimes M.D.   On: 05/27/2020 14:42   CT Head Wo Contrast  Result Date: 05/27/2020 CLINICAL DATA:  [place].  Hit the back of his head. EXAM: CT HEAD WITHOUT CONTRAST CT CERVICAL SPINE WITHOUT  CONTRAST TECHNIQUE: Multidetector CT imaging of the head and cervical spine was performed following the standard protocol without intravenous contrast. Multiplanar CT image reconstructions of the cervical spine were also generated. COMPARISON:  CT neck dated Apr 08, 2014. FINDINGS: CT HEAD FINDINGS Brain: No evidence of acute infarction, hemorrhage, hydrocephalus, extra-axial collection or mass lesion/mass effect. Mild to moderate generalized cerebral atrophy. Scattered moderate to severe periventricular and subcortical white matter hypodensities are nonspecific, but favored to reflect chronic microvascular ischemic changes. Vascular: Calcified atherosclerosis at the skullbase. No hyperdense vessel. Skull: Normal. Negative for fracture or focal lesion. Sinuses/Orbits: No acute finding. Trace fluid in the left mastoid air cells inferiorly. Other: None. CT CERVICAL SPINE FINDINGS Alignment: No traumatic malalignment. Trace anterolisthesis at C7-T1. Skull base and vertebrae: No acute fracture. No primary bone lesion or focal pathologic process. Soft tissues and spinal canal: No prevertebral fluid or swelling. No visible canal hematoma. Disc levels: Multilevel disc height loss, moderate at C5-C6 and C6-C7. Mild multilevel facet uncovertebral hypertrophy. Ankylosis of the right C3-C4 facet joint. Upper chest: Biapical pleuroparenchymal scarring. Other: Unchanged calcifications in the right thyroid lobe. Bilateral carotid artery calcific atherosclerosis. IMPRESSION: 1. No acute intracranial abnormality. 2. No acute cervical spine fracture or traumatic malalignment. Electronically Signed   By: Titus Dubin M.D.   On: 05/27/2020 14:29   CT Cervical Spine Wo Contrast  Result Date:  05/27/2020 CLINICAL DATA:  [place].  Hit the back of his head. EXAM: CT HEAD WITHOUT CONTRAST CT CERVICAL SPINE WITHOUT CONTRAST TECHNIQUE: Multidetector CT imaging of the head and cervical spine was performed following the standard protocol without intravenous contrast. Multiplanar CT image reconstructions of the cervical spine were also generated. COMPARISON:  CT neck dated Apr 08, 2014. FINDINGS: CT HEAD FINDINGS Brain: No evidence of acute infarction, hemorrhage, hydrocephalus, extra-axial collection or mass lesion/mass effect. Mild to moderate generalized cerebral atrophy. Scattered moderate to severe periventricular and subcortical white matter hypodensities are nonspecific, but favored to reflect chronic microvascular ischemic changes. Vascular: Calcified atherosclerosis at the skullbase. No hyperdense vessel. Skull: Normal. Negative for fracture or focal lesion. Sinuses/Orbits: No acute finding. Trace fluid in the left mastoid air cells inferiorly. Other: None. CT CERVICAL SPINE FINDINGS Alignment: No traumatic malalignment. Trace anterolisthesis at C7-T1. Skull base and vertebrae: No acute fracture. No primary bone lesion or focal pathologic process. Soft tissues and spinal canal: No prevertebral fluid or swelling. No visible canal hematoma. Disc levels: Multilevel disc height loss, moderate at C5-C6 and C6-C7. Mild multilevel facet uncovertebral hypertrophy. Ankylosis of the right C3-C4 facet joint. Upper chest: Biapical pleuroparenchymal scarring. Other: Unchanged calcifications in the right thyroid lobe. Bilateral carotid artery calcific atherosclerosis. IMPRESSION: 1. No acute intracranial abnormality. 2. No acute cervical spine fracture or traumatic malalignment. Electronically Signed   By: Titus Dubin M.D.   On: 05/27/2020 14:29    Procedures Procedures (including critical care time)  Medications Ordered in ED Medications  sodium chloride 0.9 % bolus 500 mL ( Intravenous Stopped 05/27/20 1620)   metoCLOPramide (REGLAN) injection 10 mg (10 mg Intravenous Given 05/27/20 1524)  diphenhydrAMINE (BENADRYL) injection 25 mg (25 mg Intravenous Given 05/27/20 1520)  LORazepam (ATIVAN) tablet 0.5 mg (0.5 mg Oral Given 05/27/20 1648)    ED Course  I have reviewed the triage vital signs and the nursing notes.  Pertinent labs & imaging results that were available during my care of the patient were reviewed by me and considered in my medical decision making (see chart for details).  Clinical Course as  of May 27 1648  Wed May 27, 2020  1450 Bacteria, UA(!): RARE [JS]  1452 Improved from prior  Creatinine(!): 1.35 [JS]    Clinical Course User Index [JS] Janeece Fitting, PA-C   MDM Rules/Calculators/A&P   She with a newly diagnosed history of depression presents to the ED brought in by daughter [name] status post mechanical fall.  According to daughter patient was placed on Lexapro approximately 2 weeks ago after the death of his wife.  He has been eating less, not drinking, has looked over a week.  Today he had a mechanical fall in the shower.  During evaluation patient is alert and oriented x4, is able to answer questions and speaking in full sentences.  Does report neck pain along with headache, these have been ongoing for the past 3 weeks but exacerbated by the fall.  Does have a goose egg to the left posterior aspect of his head.  There is a skin tear to his left forearm, bruising noted to the left upper arm, bruising noted to extremities.  There is pain with palpation along the cervical spine in the midline.  Has not taken anything for pain relief.  Interpretation of his labs by me showed a CBC without any leukocytosis, no signs of anemia.  CMP without any electrolyte derangement, creatinine level is 1.35, improved from previous visit.UA showed moderate hemoglobin, rare bacteria.   CT HEAD/Cervical Spine showed: 1. No acute intracranial abnormality.  2. No acute cervical spine fracture or  traumatic malalignment.    DG CHEST 2 VIEW: No acute or traumatic finding. Kyphotic curvature resulting in  lordotic positioning. Hiatal hernia. Aortic atherosclerosis.     Patient was provided with a 500 mL bolus, labs without signs of dehydration, creatine level improved from prior.    4:14 PM Spoke to Dr. Annabell Sabal who reviewed EKG and showed Complete heart block with complete ventricular escape. Will need to evaluate for pace maker. Will need EP consult. Will need to higher level of care than telemetry. 2 Heart, will need ICU beds.   4:48 PM Patient has been admitted to cardiology service. Patient provided with ativan PO due to restless leg, he is currently on this medication at home.    Portions of this note were generated with [employer]. Dictation errors may occur despite best attempts at proofreading.  Final Clinical Impression(s) / ED Diagnoses Final diagnoses:  Weakness  Fall, initial encounter  Heart block    Rx / DC Orders ED Discharge Orders    None       Janeece Fitting, Hershal Coria 05/27/20 1649    Hayden Rasmussen, MD 05/27/20 2038

## 2020-05-27 NOTE — ED Notes (Signed)
PT states he is restless and legs are bothering him. Provider aware.

## 2020-05-27 NOTE — ED Notes (Signed)
Lost balance this am  [place] hit shower doors w back of head, knot to back of head  Denies loc,  Also c/o ha in the front and neck pain x 2 weeks

## 2020-05-27 NOTE — H&P (Signed)
Cardiology Admission History and Physical:   Patient ID: [name] MRN: [id number]; DOB: 09-03-1919   Admission date: 05/27/2020  Primary Care Provider: Susy Frizzle, MD [facility] Cardiologist: No primary care provider on file.  [facility] Electrophysiologist:  None   Chief Complaint: mechanical fall   Patient Profile:   62M with HTN, PVD, GERD and hx of NHL who presents with mechanical fall and hx of lighheadedness, transferred to [facility] for Mobitz type II AVB.     History of Present Illness:   [name] is a very functional 86 year old who has a lot of family support at home.  He reports that he had gone to the rest room around 0900 and was cleaning up when he suddenly felt that and hit his head on the edge of the shower insert.  He denied any visual disturbances, presyncope, or syncopal event during his fall.  He does not know why he fell and does not have frequent falls at home. Speaking to his daughter she reports that he has had several occurrences of lightheadedness or what he sometimes describes as a swimmy head.  Been sometimes given Dramamine and his symptoms seem to resolve.  He did have one other mechanical fall around 2 years ago however he reports this was in the setting of using a wheelchair that could not fit through the door and upon standing without his walker he lost his balance and fell.  He is pretty reliable about using walker around his house.  Is hard to differentiate if his recent mechanical fall was more related to his AVB or general instability.  Past Medical History:  Diagnosis Date  . Acid reflux   . Cancer ([place]) 09/05/13   left neck-non-hodgkins lymphoma  . Hypertension    requires diuretic  . Hypothyroidism   . Malaria    while in the service  . Peripheral vascular disease ([place])    slow wound healing on legs   Past Surgical History:  Procedure Laterality Date  . AMPUTATION Left 06/25/2015   Procedure: LEFT  HALLUX AMPUTATION,;   Surgeon: Wylene Simmer, MD;  Location: [place];  Service: Orthopedics;  Laterality: Left;  LOCAL/MAC  . CHOLECYSTECTOMY N/A 11/08/2018   Procedure: LAPAROSCOPIC CHOLECYSTECTOMY WITH INTRAOPERATIVE CHOLANGIOGRAM;  Surgeon: [name], MD;  Location: [facility];  Service: General;  Laterality: N/A;  . MASS BIOPSY Left 09/05/2013   Procedure: EXCISIONAL BIOPSY OF LEFT NECK MASS;  Surgeon: Jerrell Belfast, MD;  Location: [place];  Service: ENT;  Laterality: Left;  . TONSILLECTOMY    . YAG LASER APPLICATION Right 4/43/1540   Procedure: YAG LASER APPLICATION;  Surgeon: [name], MD;  Location: [facility];  Service: Ophthalmology;  Laterality: Right;    Medications Prior to Admission: Prior to Admission medications   Medication Sig Start Date End Date Taking? Authorizing Provider  amLODipine (NORVASC) 5 MG tablet TAKE 1 TABLET BY MOUTH  DAILY Patient taking differently: Take 5 mg by mouth daily.  04/15/19  Yes Susy Frizzle, MD  aspirin EC 81 MG tablet Take 1 tablet (81 mg total) by mouth daily. 09/05/13  Yes Jerrell Belfast, MD  enalapril (VASOTEC) 20 MG tablet TAKE 1 TABLET BY MOUTH  DAILY Patient taking differently: Take 20 mg by mouth daily.  11/11/19  Yes Susy Frizzle, MD  escitalopram (LEXAPRO) 10 MG tablet Take 1 tablet (10 mg total) by mouth daily. 05/18/20  Yes Susy Frizzle, MD  furosemide (LASIX) 20 MG tablet TAKE 2  TABLETS BY MOUTH  EVERY DAY Patient taking differently: Take 40 mg by mouth daily.  12/09/19  Yes Susy Frizzle, MD  levothyroxine (SYNTHROID) 112 MCG tablet TAKE 1 TABLET BY MOUTH  DAILY Patient taking differently: Take 112 mcg by mouth daily.  04/15/19  Yes Susy Frizzle, MD  LORazepam (ATIVAN) 0.5 MG tablet Take 1 tablet (0.5 mg total) by mouth every 8 (eight) hours as needed for anxiety. 04/02/20  Yes Susy Frizzle, MD  vitamin B-12 (CYANOCOBALAMIN) 1000 MCG tablet Take 1 tablet (1,000 mcg total) by mouth daily. 05/01/15  Yes Susy Frizzle,  MD  famotidine (PEPCID) 20 MG tablet Take 20 mg by mouth 2 (two) times daily.  11/29/18  [provider]    Allergies:    Allergies  Allergen Reactions  . Codeine Nausea And Vomiting  . Percocet [Oxycodone-Acetaminophen] Nausea And Vomiting   Social History:   Social History   Socioeconomic History  . Marital status: Married    Spouse name: Not on file  . Number of children: 4  . Years of education: Not on file  . Highest education level: Not on file  Occupational History  . Occupation: retired  Tobacco Use  . Smoking status: Former Smoker    Types: Cigars  . Smokeless tobacco: Former Systems developer    Quit date: 10/05/1979  Substance and Sexual Activity  . Alcohol use: No  . Drug use: No  . Sexual activity: Not on file  Other Topics Concern  . Not on file  Social History Narrative  . Not on file   Social Determinants of Health   Financial Resource Strain:   . Difficulty of Paying Living Expenses:   Food Insecurity:   . Worried About Charity fundraiser in the Last Year:   . Arboriculturist in the Last Year:   Transportation Needs:   . Film/video editor (Medical):   Marland Kitchen Lack of Transportation (Non-Medical):   Physical Activity:   . Days of Exercise per Week:   . Minutes of Exercise per Session:   Stress:   . Feeling of Stress :   Social Connections:   . Frequency of Communication with Friends and Family:   . Frequency of Social Gatherings with Friends and Family:   . Attends Religious Services:   . Active Member of Clubs or Organizations:   . Attends Archivist Meetings:   Marland Kitchen Marital Status:   Intimate Partner Violence:   . Fear of Current or Ex-Partner:   . Emotionally Abused:   Marland Kitchen Physically Abused:   . Sexually Abused:     Family History:   The patient's family history includes Heart disease in his brother; Lung cancer in his brother; Stroke in his brother.    ROS:   Review of Systems: [y] = yes, _0  = no       General: Weight gain _1 ;  Weight loss _2 ; Anorexia _3 ; Fatigue _4 ; Fever _5 ; Chills _6 ; Weakness _7     Cardiac: Chest pain/pressure _8 ; Resting SOB _9 ; Exertional SOB _10 ; Orthopnea _11 ; Pedal Edema _12 ; Palpitations _13 ; Syncope _14 ; Presyncope _15 ; Paroxysmal nocturnal dyspnea _16     Pulmonary: Cough _17 ; Wheezing _18 ; Hemoptysis _19 ; Sputum _20 ; Snoring _21     GI: Vomiting _22 ; Dysphagia _23 ; Melena _24 ; Hematochezia _25 ; Heartburn _26 ; Abdominal pain _27 ;  Constipation _0 ; Diarrhea _1 ; BRBPR _2     GU: Hematuria _3 ; Dysuria _4 ; Nocturia _5   Vascular: Pain in legs with walking _6 ; Pain in feet with lying flat _7 ; Non-healing sores _8 ; Stroke _9 ; TIA _10 ; Slurred speech _11 ;    Neuro: Headaches _12 ; Vertigo _13 ; Seizures _14 ; Paresthesias _15 ;Blurred vision _16 ; Diplopia _17 ; Vision changes _18 , mechanical fall [y]   Ortho/Skin: Arthritis _19 ; Joint pain _20 ; Muscle pain _21 ; Joint swelling _22 ; Back Pain _23 ; Rash _24     Psych: Depression _25 ; Anxiety _26     Heme: Bleeding problems _27 ; Clotting disorders _28 ; Anemia _29     Endocrine: Diabetes _30 ; Thyroid dysfunction _31    Physical Exam/Data:   Vitals:   05/27/20 1930 05/27/20 2000 05/27/20 2100 05/27/20 2200  BP: (!) 167/96 (!) 175/92 (!) 163/90 (!) 169/86  Pulse:      Resp: 18 18 (!) 25 17  Temp: 97.7 F (36.5 C)     TempSrc: Oral     SpO2:      Weight:      Height:        Intake/Output Summary (Last 24 hours) at 05/27/2020 2318 Last data filed at 05/27/2020 1723 Gross per 24 hour  Intake 500.11 ml  Output 350 ml  Net 150.11 ml   Last 3 Weights 05/27/2020 05/18/2020 07/16/2019  Weight (lbs) 235 lb 234 lb 191 lb  Weight (kg) 106.595 kg 106.142 kg 86.637 kg     Body mass index is 31 kg/m.  General:  Well nourished, well developed, in no acute distress HEENT: normal Lymph: no adenopathy Neck: no JVD Endocrine:  No thryomegaly Vascular: No carotid bruits; FA pulses 2+ bilaterally without bruits  Cardiac:  normal S1, S2;  RRR; no murmur  Lungs:  clear to auscultation bilaterally, no wheezing, rhonchi or rales  Abd: soft, nontender, no hepatomegaly  Ext: no LE edema Musculoskeletal:  No deformities, BUE and BLE strength normal and equal Skin: warm and dry  Neuro:  CNs 2-12 intact, no focal abnormalities noted Psych:  Normal affect   EKG:  The ECG that was done and was personally reviewed and demonstrates Mobitz type II   Relevant CV Studies: none  Laboratory Data:  High Sensitivity Troponin:  No results for input(s): TROPONINIHS in the last 720 hours.    Chemistry Recent Labs  Lab 05/27/20 1346  NA 137  K 4.9  CL 100  CO2 25  GLUCOSE 111*  BUN 24*  CREATININE 1.35*  CALCIUM 9.6  GFRNONAA 43*  GFRAA 50*  ANIONGAP 12    Recent Labs  Lab 05/27/20 1346  PROT 8.1  ALBUMIN 4.8  AST 25  ALT 14  ALKPHOS 51  BILITOT 1.0   Hematology Recent Labs  Lab 05/27/20 1347  WBC 6.5  RBC 4.43  HGB 14.2  HCT 43.3  MCV 97.7  MCH 32.1  MCHC 32.8  RDW 13.1  PLT 182   BNPNo results for input(s): BNP, PROBNP in the last 168 hours.  DDimer No results for input(s): DDIMER in the last 168 hours.  Radiology/Studies:  DG Chest 2 View  Result Date: 05/27/2020 CLINICAL DATA:  [place] today after losing balance. EXAM: CHEST - 2 VIEW COMPARISON:  11/06/2018 FINDINGS: Lordotic positioning. Heart size is normal. Hiatal hernia. Aortic atherosclerosis. The lungs are clear. No pneumothorax or hemothorax. No  visible rib fracture. Chronic thoracic kyphotic curvature. IMPRESSION: No acute or traumatic finding. Kyphotic curvature resulting in lordotic positioning. Hiatal hernia. Aortic atherosclerosis. Electronically Signed   By: Nelson Chimes M.D.   On: 05/27/2020 14:42   CT Head Wo Contrast  Result Date: 05/27/2020 CLINICAL DATA:  [place].  Hit the back of his head. EXAM: CT HEAD WITHOUT CONTRAST CT CERVICAL SPINE WITHOUT CONTRAST TECHNIQUE: Multidetector CT imaging of the head and cervical spine was performed  following the standard protocol without intravenous contrast. Multiplanar CT image reconstructions of the cervical spine were also generated. COMPARISON:  CT neck dated Apr 08, 2014. FINDINGS: CT HEAD FINDINGS Brain: No evidence of acute infarction, hemorrhage, hydrocephalus, extra-axial collection or mass lesion/mass effect. Mild to moderate generalized cerebral atrophy. Scattered moderate to severe periventricular and subcortical white matter hypodensities are nonspecific, but favored to reflect chronic microvascular ischemic changes. Vascular: Calcified atherosclerosis at the skullbase. No hyperdense vessel. Skull: Normal. Negative for fracture or focal lesion. Sinuses/Orbits: No acute finding. Trace fluid in the left mastoid air cells inferiorly. Other: None. CT CERVICAL SPINE FINDINGS Alignment: No traumatic malalignment. Trace anterolisthesis at C7-T1. Skull base and vertebrae: No acute fracture. No primary bone lesion or focal pathologic process. Soft tissues and spinal canal: No prevertebral fluid or swelling. No visible canal hematoma. Disc levels: Multilevel disc height loss, moderate at C5-C6 and C6-C7. Mild multilevel facet uncovertebral hypertrophy. Ankylosis of the right C3-C4 facet joint. Upper chest: Biapical pleuroparenchymal scarring. Other: Unchanged calcifications in the right thyroid lobe. Bilateral carotid artery calcific atherosclerosis. IMPRESSION: 1. No acute intracranial abnormality. 2. No acute cervical spine fracture or traumatic malalignment. Electronically Signed   By: Titus Dubin M.D.   On: 05/27/2020 14:29   CT Cervical Spine Wo Contrast  Result Date: 05/27/2020 CLINICAL DATA:  [place].  Hit the back of his head. EXAM: CT HEAD WITHOUT CONTRAST CT CERVICAL SPINE WITHOUT CONTRAST TECHNIQUE: Multidetector CT imaging of the head and cervical spine was performed following the standard protocol without intravenous contrast. Multiplanar CT image reconstructions of the cervical spine  were also generated. COMPARISON:  CT neck dated Apr 08, 2014. FINDINGS: CT HEAD FINDINGS Brain: No evidence of acute infarction, hemorrhage, hydrocephalus, extra-axial collection or mass lesion/mass effect. Mild to moderate generalized cerebral atrophy. Scattered moderate to severe periventricular and subcortical white matter hypodensities are nonspecific, but favored to reflect chronic microvascular ischemic changes. Vascular: Calcified atherosclerosis at the skullbase. No hyperdense vessel. Skull: Normal. Negative for fracture or focal lesion. Sinuses/Orbits: No acute finding. Trace fluid in the left mastoid air cells inferiorly. Other: None. CT CERVICAL SPINE FINDINGS Alignment: No traumatic malalignment. Trace anterolisthesis at C7-T1. Skull base and vertebrae: No acute fracture. No primary bone lesion or focal pathologic process. Soft tissues and spinal canal: No prevertebral fluid or swelling. No visible canal hematoma. Disc levels: Multilevel disc height loss, moderate at C5-C6 and C6-C7. Mild multilevel facet uncovertebral hypertrophy. Ankylosis of the right C3-C4 facet joint. Upper chest: Biapical pleuroparenchymal scarring. Other: Unchanged calcifications in the right thyroid lobe. Bilateral carotid artery calcific atherosclerosis. IMPRESSION: 1. No acute intracranial abnormality. 2. No acute cervical spine fracture or traumatic malalignment. Electronically Signed   By: Titus Dubin M.D.   On: 05/27/2020 14:29   Assessment and Plan:   1. Mobitz type II  Although it is difficult to differentiate whether Mr. Losee fall was related to generalized weakness and was purely mechanical or related to his conduction disease, and would be really unfortunate if it is related to his  AV block and he had recurrent fall resulting in hip fracture or other injury.  Given that he has a fairly functional 86 year old with minimal comorbidities and still completing the majority of his ADLs I think reasonable to  consider PPM placement.  I discussed with him that the EP team will see him in the morning and only discussed different potential options if he and his family want to pursue PPM placement.  Given his age, risk with longer anesthesia, likely low burden of pacing and recovery I think it would be reasonable to consider [facility] but will defer to EP team for their preference. Patient is not on any AVN blockade other than amlodipine. - NPO MN, hold ppx anticoagulation   2. HTN  Patient reports he took all AM meds, BP still 140-160, likely will need uptitration/addition of meds as OP.  - continue enalapril 20 mg PO daily - continue amlodipine 5 mg PO daily  3. MDD Unfortunately his wife recently passed and he was started on lexapro and prn ativan at night as OP, family hoping to wean off ativan soon but currently still requiring single dose at night.  - continue lexapro 10 mg PO daily - continue ativan 0.5 mg PO qhs   4. Hypothyroidism  - continue synthroid 112 mcg PO daily   Severity of Illness: The appropriate patient status for this patient is INPATIENT. Inpatient status is judged to be reasonable and necessary in order to provide the required intensity of service to ensure the patient's safety. The patient's presenting symptoms, physical exam findings, and initial radiographic and laboratory data in the context of their chronic comorbidities is felt to place them at high risk for further clinical deterioration. Furthermore, it is not anticipated that the patient will be medically stable for discharge from the hospital within 2 midnights of admission. The following factors support the patient status of inpatient.   " The patient's presenting symptoms include fall. " The worrisome physical exam findings include telemetry with high degree AV block " The initial radiographic and laboratory data are worrisome because of n/a " The chronic co-morbidities include HTN, hypothyroidism   * I certify that  at the point of admission it is my clinical judgment that the patient will require inpatient hospital care spanning beyond 2 midnights from the point of admission due to high intensity of service, high risk for further deterioration and high frequency of surveillance required.*   For questions or updates, please contact [place] Please consult [email or web address] for contact info under   Signed, Dion Body, MD  05/27/2020 11:18 PM

## 2020-05-27 NOTE — ED Notes (Signed)
PT able to have a visitor overnight per ICU charge RN. Daughter in room and informed.

## 2020-05-27 NOTE — ED Triage Notes (Signed)
Per daughter pt with hx of weakness/off balance since April after loss of spouse-pt states he got weak and fell in bathroom this am and struck back of head and skin tear to right FA-pt states he has been having pain to back of head and neck x 1 week-pt with skin tear to right FA-dsg applied in triage-pt seen by PCP 6/21 dx with dehydration and was started on med for depression

## 2020-05-28 ENCOUNTER — Encounter (HOSPITAL_COMMUNITY)
Admission: EM | Disposition: A | Discharge status: Home / Self Care | Payer: Self-pay | Source: Home / Self Care | Attending: Cardiology

## 2020-05-28 ENCOUNTER — Inpatient Hospital Stay (HOSPITAL_COMMUNITY): Payer: Medicare Other

## 2020-05-28 DIAGNOSIS — N1831 Chronic kidney disease, stage 3a: Secondary | ICD-10-CM

## 2020-05-28 DIAGNOSIS — R9431 Abnormal electrocardiogram [ECG] [EKG]: Secondary | ICD-10-CM

## 2020-05-28 DIAGNOSIS — I459 Conduction disorder, unspecified: Secondary | ICD-10-CM

## 2020-05-28 DIAGNOSIS — E039 Hypothyroidism, unspecified: Secondary | ICD-10-CM

## 2020-05-28 DIAGNOSIS — W19XXXA Unspecified fall, initial encounter: Secondary | ICD-10-CM

## 2020-05-28 DIAGNOSIS — I10 Essential (primary) hypertension: Secondary | ICD-10-CM

## 2020-05-28 HISTORY — PX: PACEMAKER IMPLANT: EP1218

## 2020-05-28 LAB — ECHOCARDIOGRAM COMPLETE
Height: 73 in
Weight: 3178.15 oz

## 2020-05-28 LAB — SURGICAL PCR SCREEN
MRSA, PCR: NEGATIVE
Staphylococcus aureus: NEGATIVE

## 2020-05-28 SURGERY — PACEMAKER IMPLANT

## 2020-05-28 MED ORDER — IOHEXOL 350 MG/ML SOLN
INTRAVENOUS | Status: DC | PRN
  Administered 2020-05-28: 15 mL

## 2020-05-28 MED ORDER — SODIUM CHLORIDE 0.9% FLUSH
3.0000 mL | Freq: Two times a day (BID) | INTRAVENOUS | Status: DC
  Administered 2020-05-28: 3 mL via INTRAVENOUS

## 2020-05-28 MED ORDER — SODIUM CHLORIDE 0.9 % IV SOLN: 250.0000 mL | INTRAVENOUS | Status: DC

## 2020-05-28 MED ORDER — CHLORHEXIDINE GLUCONATE CLOTH 2 % EX PADS
6.0000 | MEDICATED_PAD | Freq: Every day | CUTANEOUS | Status: DC
  Administered 2020-05-28: 6 via TOPICAL

## 2020-05-28 MED ORDER — CHLORHEXIDINE GLUCONATE 4 % EX LIQD
60.0000 mL | Freq: Once | CUTANEOUS | Status: AC
  Administered 2020-05-28: 4 via TOPICAL
  Filled 2020-05-28: qty 15

## 2020-05-28 MED ORDER — LIDOCAINE HCL (PF) 1 % IJ SOLN
INTRAMUSCULAR | Status: DC | PRN
  Administered 2020-05-28: 60 mL

## 2020-05-28 MED ORDER — CEFAZOLIN SODIUM-DEXTROSE 2-4 GM/100ML-% IV SOLN
2.0000 g | INTRAVENOUS | Status: AC
  Administered 2020-05-28: 2 g via INTRAVENOUS
  Filled 2020-05-28: qty 100

## 2020-05-28 MED ORDER — SODIUM CHLORIDE 0.9 % IV SOLN: INTRAVENOUS | Status: DC

## 2020-05-28 MED ORDER — ACETAMINOPHEN 325 MG PO TABS: 325.0000 mg | ORAL_TABLET | ORAL | Status: DC | PRN

## 2020-05-28 MED ORDER — FUROSEMIDE 40 MG PO TABS
40.0000 mg | ORAL_TABLET | Freq: Every day | ORAL | Status: DC
  Administered 2020-05-29: 40 mg via ORAL
  Filled 2020-05-28: qty 1

## 2020-05-28 MED ORDER — ONDANSETRON HCL 4 MG/2ML IJ SOLN: 4.0000 mg | Freq: Four times a day (QID) | INTRAMUSCULAR | Status: DC | PRN

## 2020-05-28 MED ORDER — SODIUM CHLORIDE 0.9% FLUSH
3.0000 mL | Freq: Two times a day (BID) | INTRAVENOUS | Status: DC
  Administered 2020-05-28 – 2020-05-29 (×2): 3 mL via INTRAVENOUS

## 2020-05-28 MED ORDER — CHLORHEXIDINE GLUCONATE 4 % EX LIQD
60.0000 mL | Freq: Once | CUTANEOUS | Status: AC
  Filled 2020-05-28: qty 15

## 2020-05-28 MED ORDER — CEFAZOLIN SODIUM-DEXTROSE 1-4 GM/50ML-% IV SOLN
1.0000 g | Freq: Four times a day (QID) | INTRAVENOUS | Status: AC
  Administered 2020-05-28 – 2020-05-29 (×3): 1 g via INTRAVENOUS
  Filled 2020-05-28 (×3): qty 50

## 2020-05-28 MED ORDER — SODIUM CHLORIDE 0.9 % IV SOLN
INTRAVENOUS | Status: AC
  Filled 2020-05-28: qty 2

## 2020-05-28 MED ORDER — LIDOCAINE HCL (PF) 1 % IJ SOLN
INTRAMUSCULAR | Status: AC
  Filled 2020-05-28: qty 60

## 2020-05-28 MED ORDER — CEFAZOLIN SODIUM-DEXTROSE 2-4 GM/100ML-% IV SOLN
INTRAVENOUS | Status: AC
  Filled 2020-05-28: qty 100

## 2020-05-28 MED ORDER — SODIUM CHLORIDE 0.9 % IV SOLN
80.0000 mg | INTRAVENOUS | Status: AC
  Administered 2020-05-28: 80 mg
  Filled 2020-05-28: qty 2

## 2020-05-28 MED ORDER — CHLORHEXIDINE GLUCONATE CLOTH 2 % EX PADS: 6.0000 | MEDICATED_PAD | Freq: Every day | CUTANEOUS | Status: DC

## 2020-05-28 MED ORDER — HEPARIN (PORCINE) IN NACL 1000-0.9 UT/500ML-% IV SOLN
INTRAVENOUS | Status: AC
  Filled 2020-05-28: qty 500

## 2020-05-28 MED ORDER — SODIUM CHLORIDE 0.9% FLUSH: 3.0000 mL | INTRAVENOUS | Status: DC | PRN

## 2020-05-28 MED ORDER — SODIUM CHLORIDE 0.9 % IV SOLN
250.0000 mL | INTRAVENOUS | Status: DC | PRN
  Administered 2020-05-28: 250 mL via INTRAVENOUS

## 2020-05-28 MED ORDER — HEPARIN (PORCINE) IN NACL 1000-0.9 UT/500ML-% IV SOLN
INTRAVENOUS | Status: DC | PRN
  Administered 2020-05-28: 500 mL

## 2020-05-28 SURGICAL SUPPLY — 9 items
CABLE SURGICAL S-101-97-12 (CABLE) ×3 IMPLANT
KIT MICROPUNCTURE NIT STIFF (SHEATH) ×2 IMPLANT
LEAD TENDRIL MRI 52CM LPA1200M (Lead) ×2 IMPLANT
LEAD TENDRIL MRI 58CM LPA1200M (Lead) ×2 IMPLANT
PACEMAKER ASSURITY DR-RF (Pacemaker) ×2 IMPLANT
PAD PRO RADIOLUCENT 2001M-C (PAD) ×3 IMPLANT
SHEATH 8FR PRELUDE SNAP 13 (SHEATH) ×4 IMPLANT
SHEATH PINNACLE 6F 10CM (SHEATH) ×2 IMPLANT
TRAY PACEMAKER INSERTION (PACKS) ×3 IMPLANT

## 2020-05-28 NOTE — Consult Note (Addendum)
Cardiology Consultation:   Patient ID: [name] MRN: [id number]; DOB: 1919/05/04  Admit date: 05/27/2020 Date of Consult: 05/28/2020  Primary Care Provider: Susy Frizzle, MD [facility] Cardiologist: Buford Dresser, MD  [place] Electrophysiologist:  None    Patient Profile:   [name] is a 86 y.o. male with a hx of HTN, NHL, PVD (historically with some degree ov chronic venous insuff and mild LE arterial disease), CKD (III), GERD who is being seen today for the evaluation of CHB at the request of Dr. Renella Cunas.  History of Present Illness:   [name] despite his advanced age is described as quite functional and of late started to have some dizziness.  This perhaps not new, but worse of late with a uncertain mechanism with a fall He was found to have high degree AVblock at [facility] and transferred to [facility] for further evaluation and management.  He had a PMD visit 05/18/20, mentioning dizziness being treated at home with meclizine that seemed to help, also with anxiety/depression since the passing of his wife. This note mentions extremely sedentary and basically confined to a wheelchair  The family at bedside disagree with this.  He is ambulatory with his walker and independently does his ADLs, he has excellent family support and lives with his children, but cares for himself basically.,  He did have a dip in energy of late that they blamed on depression after the death of his wife  Cardiology admitted the patient, found no reversible causes for his advanced heart block, EP has been asked to see for PPM   LABS K+ 4.9 BUN/Creat 24/1.35 WBC 6.5 H/H 14/43 Plts 182  05/18/20, TSH 2.59  Home meds reviewed, amlodipine noted, no noted nodal blocking agents otherwise  BP stable  He denies any CP, palpitations.  He has been depressed of late with less energy and not eating as well as he used to, he says he has been sad with the death of his wife, but getting  better.  He says he went to the bathroom, and once inside fell and hit the back of his head.  He does not know why/how he fell, just suddenly fell and hit his head. Not symptomatic here in bed since being here   Past Medical History:  Diagnosis Date  . Acid reflux   . Cancer ([place]) 09/05/13   left neck-non-hodgkins lymphoma  . Hypertension    requires diuretic  . Hypothyroidism   . Malaria    while in the service  . Peripheral vascular disease ([place])    slow wound healing on legs    Past Surgical History:  Procedure Laterality Date  . AMPUTATION Left 06/25/2015   Procedure: LEFT  HALLUX AMPUTATION,;  Surgeon: Wylene Simmer, MD;  Location: [place];  Service: Orthopedics;  Laterality: Left;  LOCAL/MAC  . CHOLECYSTECTOMY N/A 11/08/2018   Procedure: LAPAROSCOPIC CHOLECYSTECTOMY WITH INTRAOPERATIVE CHOLANGIOGRAM;  Surgeon: [name], MD;  Location: [facility];  Service: General;  Laterality: N/A;  . MASS BIOPSY Left 09/05/2013   Procedure: EXCISIONAL BIOPSY OF LEFT NECK MASS;  Surgeon: Jerrell Belfast, MD;  Location: [place];  Service: ENT;  Laterality: Left;  . TONSILLECTOMY    . YAG LASER APPLICATION Right 4/78/2956   Procedure: YAG LASER APPLICATION;  Surgeon: [name], MD;  Location: [facility];  Service: Ophthalmology;  Laterality: Right;     Home Medications:  Prior to Admission medications   Medication Sig Start Date End Date Taking? Authorizing  Provider  amLODipine (NORVASC) 5 MG tablet TAKE 1 TABLET BY MOUTH  DAILY Patient taking differently: Take 5 mg by mouth daily.  04/15/19  Yes Susy Frizzle, MD  aspirin EC 81 MG tablet Take 1 tablet (81 mg total) by mouth daily. 09/05/13  Yes Jerrell Belfast, MD  enalapril (VASOTEC) 20 MG tablet TAKE 1 TABLET BY MOUTH  DAILY Patient taking differently: Take 20 mg by mouth daily.  11/11/19  Yes Susy Frizzle, MD  escitalopram (LEXAPRO) 10 MG tablet Take 1 tablet (10 mg total) by mouth daily. 05/18/20  Yes Susy Frizzle, MD  furosemide (LASIX) 20 MG tablet TAKE 2 TABLETS BY MOUTH  EVERY DAY Patient taking differently: Take 40 mg by mouth daily.  12/09/19  Yes Susy Frizzle, MD  levothyroxine (SYNTHROID) 112 MCG tablet TAKE 1 TABLET BY MOUTH  DAILY Patient taking differently: Take 112 mcg by mouth daily.  04/15/19  Yes Susy Frizzle, MD  LORazepam (ATIVAN) 0.5 MG tablet Take 1 tablet (0.5 mg total) by mouth every 8 (eight) hours as needed for anxiety. 04/02/20  Yes Susy Frizzle, MD  vitamin B-12 (CYANOCOBALAMIN) 1000 MCG tablet Take 1 tablet (1,000 mcg total) by mouth daily. 05/01/15  Yes Susy Frizzle, MD  famotidine (PEPCID) 20 MG tablet Take 20 mg by mouth 2 (two) times daily.  11/29/18  [provider]    Inpatient Medications: Scheduled Meds: . amLODipine  5 mg Oral Daily  . Chlorhexidine Gluconate Cloth  6 each Topical Daily  . enalapril  20 mg Oral Daily  . escitalopram  10 mg Oral Daily  . feeding supplement (ENSURE ENLIVE)  237 mL Oral BID BM  . furosemide  40 mg Oral Daily  . levothyroxine  112 mcg Oral Q0600  . LORazepam  0.5 mg Oral QHS  . vitamin B-12  1,000 mcg Oral Daily   Continuous Infusions:  PRN Meds: docusate sodium, polyethylene glycol  Allergies:    Allergies  Allergen Reactions  . Codeine Nausea And Vomiting  . Percocet [Oxycodone-Acetaminophen] Nausea And Vomiting    Social History:   Social History   Socioeconomic History  . Marital status: Married    Spouse name: Not on file  . Number of children: 4  . Years of education: Not on file  . Highest education level: Not on file  Occupational History  . Occupation: retired  Tobacco Use  . Smoking status: Former Smoker    Types: Cigars  . Smokeless tobacco: Former Systems developer    Quit date: 10/05/1979  Substance and Sexual Activity  . Alcohol use: No  . Drug use: No  . Sexual activity: Not on file  Other Topics Concern  . Not on file  Social History Narrative  . Not on file   Social Determinants  of Health   Financial Resource Strain:   . Difficulty of Paying Living Expenses:   Food Insecurity:   . Worried About Charity fundraiser in the Last Year:   . Arboriculturist in the Last Year:   Transportation Needs:   . Film/video editor (Medical):   Marland Kitchen Lack of Transportation (Non-Medical):   Physical Activity:   . Days of Exercise per Week:   . Minutes of Exercise per Session:   Stress:   . Feeling of Stress :   Social Connections:   . Frequency of Communication with Friends and Family:   . Frequency of Social Gatherings with Friends and Family:   .  Attends Religious Services:   . Active Member of Clubs or Organizations:   . Attends Archivist Meetings:   Marland Kitchen Marital Status:   Intimate Partner Violence:   . Fear of Current or Ex-Partner:   . Emotionally Abused:   Marland Kitchen Physically Abused:   . Sexually Abused:     Family History:   Family History  Problem Relation Age of Onset  . Lung cancer Brother   . Heart disease Brother   . Stroke Brother      ROS:  Please see the history of present illness.  All other ROS reviewed and negative.     Physical Exam/Data:   Vitals:   05/28/20 0500 05/28/20 0600 05/28/20 0747 05/28/20 0800  BP: (!) 145/77 124/65  (!) 149/71  Pulse: (!) 49 (!) 52  68  Resp: _0 Temp:   98.4 F (36.9 C)   TempSrc:   Oral   SpO2: 96% 97%  100%  Weight: 90.1 kg     Height:        Intake/Output Summary (Last 24 hours) at 05/28/2020 0929 Last data filed at 05/28/2020 0800 Gross per 24 hour  Intake 620.11 ml  Output 1400 ml  Net -779.89 ml   Last 3 Weights 05/28/2020 05/27/2020 05/18/2020  Weight (lbs) 198 lb 10.2 oz 235 lb 234 lb  Weight (kg) 90.1 kg 106.595 kg 106.142 kg     Body mass index is 26.21 kg/m.  General:  Well nourished, well developed, in no acute distress HEENT: normal Lymph: no adenopathy Neck: no JVD Endocrine:  No thryomegaly Vascular: No carotid bruits Cardiac:  RRR; 1/6 SM, no gallops or rubs Lungs:  CTA  b/l, no wheezing, rhonchi or rales  Abd: soft, nontender Ext: no edema Musculoskeletal:  No deformities, age appropriate atrophy Skin: warm and dry  Neuro:  No focal abnormalities noted Psych:  Normal affect   EKG:  The EKG was personally reviewed and demonstrates:   2:1 AVBlock > SR long 1st degree, V rate 44, QRS 129m, motion though no clear ST changes SR 82, 1st degree AVblock, 3040m 11/06/2018 artifact, loosk SR 1st degree AVblock, 91bpm, 280-30066mR  Telemetry:  Telemetry was personally reviewed and demonstrates:    SR 1st degree AVblock with periods or 2:1 as well as CHB.  He has had some juncyional escape as well dipping to high 20's with IVCD/BBB    Relevant CV Studies:  Echo has been done, pending official read  Dr. ChrJudeth Cornfieldte this AM mentions "on my review, EF appears normal. Some aortic valve sclerosis without significant stenosis. MAC without severe MR. No clear structural cause based on echo"  Laboratory Data:  High Sensitivity Troponin:  No results for input(s): TROPONINIHS in the last 720 hours.   Chemistry Recent Labs  Lab 05/27/20 1346  NA 137  K 4.9  CL 100  CO2 25  GLUCOSE 111*  BUN 24*  CREATININE 1.35*  CALCIUM 9.6  GFRNONAA 43*  GFRAA 50*  ANIONGAP 12    Recent Labs  Lab 05/27/20 1346  PROT 8.1  ALBUMIN 4.8  AST 25  ALT 14  ALKPHOS 51  BILITOT 1.0   Hematology Recent Labs  Lab 05/27/20 1347  WBC 6.5  RBC 4.43  HGB 14.2  HCT 43.3  MCV 97.7  MCH 32.1  MCHC 32.8  RDW 13.1  PLT 182   BNPNo results for input(s): BNP, PROBNP in the last 168 hours.  DDimer No results for  input(s): DDIMER in the last 168 hours.   Radiology/Studies:   DG Chest 2 View Result Date: 05/27/2020 CLINICAL DATA:  [place] today after losing balance. EXAM: CHEST - 2 VIEW COMPARISON:  11/06/2018 FINDINGS: Lordotic positioning. Heart size is normal. Hiatal hernia. Aortic atherosclerosis. The lungs are clear. No pneumothorax or hemothorax. No  visible rib fracture. Chronic thoracic kyphotic curvature. IMPRESSION: No acute or traumatic finding. Kyphotic curvature resulting in lordotic positioning. Hiatal hernia. Aortic atherosclerosis. Electronically Signed   By: Nelson Chimes M.D.   On: 05/27/2020 14:42    CT Head Wo Contrast Result Date: 05/27/2020 CLINICAL DATA:  [place].  Hit the back of his head. EXAM: CT HEAD WITHOUT CONTRAST CT CERVICAL SPINE WITHOUT CONTRAST TECHNIQUE: Multidetector CT imaging of the head and cervical spine was performed following the standard protocol without intravenous contrast. Multiplanar CT image reconstructions of the cervical spine were also generated. COMPARISON:  CT neck dated Apr 08, 2014. FINDINGS: CT HEAD FINDINGS Brain: No evidence of acute infarction, hemorrhage, hydrocephalus, extra-axial collection or mass lesion/mass effect. Mild to moderate generalized cerebral atrophy. Scattered moderate to severe periventricular and subcortical white matter hypodensities are nonspecific, but favored to reflect chronic microvascular ischemic changes. Vascular: Calcified atherosclerosis at the skullbase. No hyperdense vessel. Skull: Normal. Negative for fracture or focal lesion. Sinuses/Orbits: No acute finding. Trace fluid in the left mastoid air cells inferiorly. Other: None. CT CERVICAL SPINE FINDINGS Alignment: No traumatic malalignment. Trace anterolisthesis at C7-T1. Skull base and vertebrae: No acute fracture. No primary bone lesion or focal pathologic process. Soft tissues and spinal canal: No prevertebral fluid or swelling. No visible canal hematoma. Disc levels: Multilevel disc height loss, moderate at C5-C6 and C6-C7. Mild multilevel facet uncovertebral hypertrophy. Ankylosis of the right C3-C4 facet joint. Upper chest: Biapical pleuroparenchymal scarring. Other: Unchanged calcifications in the right thyroid lobe. Bilateral carotid artery calcific atherosclerosis. IMPRESSION: 1. No acute intracranial abnormality. 2. No  acute cervical spine fracture or traumatic malalignment. Electronically Signed   By: Titus Dubin M.D.   On: 05/27/2020 14:29    Assessment and Plan:   1. Fall suspect syncope 2. High degree AVblock 2:1, and transient CHB has been noted on telemetry as well     No reversible causes  Recommend PPM Discussed with the patient and his daughters at bedside rational for PPM, we discussed implant procedure (for a traditional PPM) and it's potential benefits and risks They are all agreeable to proceed.  Echo has been done, Dr. Harrell Gave this AM note mentions: "EF appears normal. Some aortic valve sclerosis without significant stenosis. MAC without severe MR. No clear structural cause based on echo"  I have placed the patient on the EP schedule for later today Dr. Rayann Heman will see him later this AM   For questions or updates, please contact [facility] Please consult [email or web address] for contact info under    Signed, Baldwin Jamaica, PA-C  05/28/2020 9:29 AM '  I have seen, examined the patient, and reviewed the above assessment and plan.  Changes to above are made where necessary.  On exam, RRR.  The patient presents with intermittent complete heart block.  He has advanced conduction system disease. The patient has symptomatic bradycardia.  I would therefore recommend pacemaker implantation at this time.  Risks, benefits, alternatives to pacemaker implantation were discussed in detail with the patient and his two daughters today. The patient understands that the risks include but are not limited to bleeding, infection, pneumothorax, perforation, tamponade, vascular damage, renal failure, MI,  stroke, death,  and lead dislodgement and wishes to proceed. We will therefore schedule the procedure at the next available time.  We also discussed remote monitoring and its role today.   Co Sign: Thompson Grayer, MD 05/28/2020 12:40 PM

## 2020-05-28 NOTE — Interval H&P Note (Signed)
History and Physical Interval Note:  05/28/2020 12:42 PM  [name]  has presented today for surgery, with the diagnosis of complete heart block.  The various methods of treatment have been discussed with the patient and family. After consideration of risks, benefits and other options for treatment, the patient has consented to  Procedure(s): PACEMAKER IMPLANT (N/A) as a surgical intervention.  The patient's history has been reviewed, patient examined, no change in status, stable for surgery.  I have reviewed the patient's chart and labs.  Questions were answered to the patient's satisfaction.     Thompson Grayer

## 2020-05-28 NOTE — Progress Notes (Signed)
  Echocardiogram 2D Echocardiogram has been performed.  Jennette Dubin 05/28/2020, 8:26 AM

## 2020-05-28 NOTE — H&P (View-Only) (Signed)
Cardiology Consultation:   Patient ID: [name] MRN: [id number]; DOB: 1919/05/04  Admit date: 05/27/2020 Date of Consult: 05/28/2020  Primary Care Provider: Susy Frizzle, MD [facility] Cardiologist: Buford Dresser, MD  [place] Electrophysiologist:  None    Patient Profile:   [name] is a 86 y.o. male with a hx of HTN, NHL, PVD (historically with some degree ov chronic venous insuff and mild LE arterial disease), CKD (III), GERD who is being seen today for the evaluation of CHB at the request of Dr. Renella Cunas.  History of Present Illness:   [name] despite his advanced age is described as quite functional and of late started to have some dizziness.  This perhaps not new, but worse of late with a uncertain mechanism with a fall He was found to have high degree AVblock at [facility] and transferred to [facility] for further evaluation and management.  He had a PMD visit 05/18/20, mentioning dizziness being treated at home with meclizine that seemed to help, also with anxiety/depression since the passing of his wife. This note mentions extremely sedentary and basically confined to a wheelchair  The family at bedside disagree with this.  He is ambulatory with his walker and independently does his ADLs, he has excellent family support and lives with his children, but cares for himself basically.,  He did have a dip in energy of late that they blamed on depression after the death of his wife  Cardiology admitted the patient, found no reversible causes for his advanced heart block, EP has been asked to see for PPM   LABS K+ 4.9 BUN/Creat 24/1.35 WBC 6.5 H/H 14/43 Plts 182  05/18/20, TSH 2.59  Home meds reviewed, amlodipine noted, no noted nodal blocking agents otherwise  BP stable  He denies any CP, palpitations.  He has been depressed of late with less energy and not eating as well as he used to, he says he has been sad with the death of his wife, but getting  better.  He says he went to the bathroom, and once inside fell and hit the back of his head.  He does not know why/how he fell, just suddenly fell and hit his head. Not symptomatic here in bed since being here   Past Medical History:  Diagnosis Date  . Acid reflux   . Cancer ([place]) 09/05/13   left neck-non-hodgkins lymphoma  . Hypertension    requires diuretic  . Hypothyroidism   . Malaria    while in the service  . Peripheral vascular disease ([place])    slow wound healing on legs    Past Surgical History:  Procedure Laterality Date  . AMPUTATION Left 06/25/2015   Procedure: LEFT  HALLUX AMPUTATION,;  Surgeon: Wylene Simmer, MD;  Location: [place];  Service: Orthopedics;  Laterality: Left;  LOCAL/MAC  . CHOLECYSTECTOMY N/A 11/08/2018   Procedure: LAPAROSCOPIC CHOLECYSTECTOMY WITH INTRAOPERATIVE CHOLANGIOGRAM;  Surgeon: [name], MD;  Location: [facility];  Service: General;  Laterality: N/A;  . MASS BIOPSY Left 09/05/2013   Procedure: EXCISIONAL BIOPSY OF LEFT NECK MASS;  Surgeon: Jerrell Belfast, MD;  Location: [place];  Service: ENT;  Laterality: Left;  . TONSILLECTOMY    . YAG LASER APPLICATION Right 4/78/2956   Procedure: YAG LASER APPLICATION;  Surgeon: [name], MD;  Location: [facility];  Service: Ophthalmology;  Laterality: Right;     Home Medications:  Prior to Admission medications   Medication Sig Start Date End Date Taking? Authorizing  Provider  amLODipine (NORVASC) 5 MG tablet TAKE 1 TABLET BY MOUTH  DAILY Patient taking differently: Take 5 mg by mouth daily.  04/15/19  Yes Susy Frizzle, MD  aspirin EC 81 MG tablet Take 1 tablet (81 mg total) by mouth daily. 09/05/13  Yes Jerrell Belfast, MD  enalapril (VASOTEC) 20 MG tablet TAKE 1 TABLET BY MOUTH  DAILY Patient taking differently: Take 20 mg by mouth daily.  11/11/19  Yes Susy Frizzle, MD  escitalopram (LEXAPRO) 10 MG tablet Take 1 tablet (10 mg total) by mouth daily. 05/18/20  Yes Susy Frizzle, MD  furosemide (LASIX) 20 MG tablet TAKE 2 TABLETS BY MOUTH  EVERY DAY Patient taking differently: Take 40 mg by mouth daily.  12/09/19  Yes Susy Frizzle, MD  levothyroxine (SYNTHROID) 112 MCG tablet TAKE 1 TABLET BY MOUTH  DAILY Patient taking differently: Take 112 mcg by mouth daily.  04/15/19  Yes Susy Frizzle, MD  LORazepam (ATIVAN) 0.5 MG tablet Take 1 tablet (0.5 mg total) by mouth every 8 (eight) hours as needed for anxiety. 04/02/20  Yes Susy Frizzle, MD  vitamin B-12 (CYANOCOBALAMIN) 1000 MCG tablet Take 1 tablet (1,000 mcg total) by mouth daily. 05/01/15  Yes Susy Frizzle, MD  famotidine (PEPCID) 20 MG tablet Take 20 mg by mouth 2 (two) times daily.  11/29/18  [provider]    Inpatient Medications: Scheduled Meds: . amLODipine  5 mg Oral Daily  . Chlorhexidine Gluconate Cloth  6 each Topical Daily  . enalapril  20 mg Oral Daily  . escitalopram  10 mg Oral Daily  . feeding supplement (ENSURE ENLIVE)  237 mL Oral BID BM  . furosemide  40 mg Oral Daily  . levothyroxine  112 mcg Oral Q0600  . LORazepam  0.5 mg Oral QHS  . vitamin B-12  1,000 mcg Oral Daily   Continuous Infusions:  PRN Meds: docusate sodium, polyethylene glycol  Allergies:    Allergies  Allergen Reactions  . Codeine Nausea And Vomiting  . Percocet [Oxycodone-Acetaminophen] Nausea And Vomiting    Social History:   Social History   Socioeconomic History  . Marital status: Married    Spouse name: Not on file  . Number of children: 4  . Years of education: Not on file  . Highest education level: Not on file  Occupational History  . Occupation: retired  Tobacco Use  . Smoking status: Former Smoker    Types: Cigars  . Smokeless tobacco: Former Systems developer    Quit date: 10/05/1979  Substance and Sexual Activity  . Alcohol use: No  . Drug use: No  . Sexual activity: Not on file  Other Topics Concern  . Not on file  Social History Narrative  . Not on file   Social Determinants  of Health   Financial Resource Strain:   . Difficulty of Paying Living Expenses:   Food Insecurity:   . Worried About Charity fundraiser in the Last Year:   . Arboriculturist in the Last Year:   Transportation Needs:   . Film/video editor (Medical):   Marland Kitchen Lack of Transportation (Non-Medical):   Physical Activity:   . Days of Exercise per Week:   . Minutes of Exercise per Session:   Stress:   . Feeling of Stress :   Social Connections:   . Frequency of Communication with Friends and Family:   . Frequency of Social Gatherings with Friends and Family:   .  Attends Religious Services:   . Active Member of Clubs or Organizations:   . Attends Archivist Meetings:   Marland Kitchen Marital Status:   Intimate Partner Violence:   . Fear of Current or Ex-Partner:   . Emotionally Abused:   Marland Kitchen Physically Abused:   . Sexually Abused:     Family History:   Family History  Problem Relation Age of Onset  . Lung cancer Brother   . Heart disease Brother   . Stroke Brother      ROS:  Please see the history of present illness.  All other ROS reviewed and negative.     Physical Exam/Data:   Vitals:   05/28/20 0500 05/28/20 0600 05/28/20 0747 05/28/20 0800  BP: (!) 145/77 124/65  (!) 149/71  Pulse: (!) 49 (!) 52  68  Resp: _0 Temp:   98.4 F (36.9 C)   TempSrc:   Oral   SpO2: 96% 97%  100%  Weight: 90.1 kg     Height:        Intake/Output Summary (Last 24 hours) at 05/28/2020 0929 Last data filed at 05/28/2020 0800 Gross per 24 hour  Intake 620.11 ml  Output 1400 ml  Net -779.89 ml   Last 3 Weights 05/28/2020 05/27/2020 05/18/2020  Weight (lbs) 198 lb 10.2 oz 235 lb 234 lb  Weight (kg) 90.1 kg 106.595 kg 106.142 kg     Body mass index is 26.21 kg/m.  General:  Well nourished, well developed, in no acute distress HEENT: normal Lymph: no adenopathy Neck: no JVD Endocrine:  No thryomegaly Vascular: No carotid bruits Cardiac:  RRR; 1/6 SM, no gallops or rubs Lungs:  CTA  b/l, no wheezing, rhonchi or rales  Abd: soft, nontender Ext: no edema Musculoskeletal:  No deformities, age appropriate atrophy Skin: warm and dry  Neuro:  No focal abnormalities noted Psych:  Normal affect   EKG:  The EKG was personally reviewed and demonstrates:   2:1 AVBlock > SR long 1st degree, V rate 44, QRS 129m, motion though no clear ST changes SR 82, 1st degree AVblock, 3040m 11/06/2018 artifact, loosk SR 1st degree AVblock, 91bpm, 280-30066mR  Telemetry:  Telemetry was personally reviewed and demonstrates:    SR 1st degree AVblock with periods or 2:1 as well as CHB.  He has had some juncyional escape as well dipping to high 20's with IVCD/BBB    Relevant CV Studies:  Echo has been done, pending official read  Dr. ChrJudeth Cornfieldte this AM mentions "on my review, EF appears normal. Some aortic valve sclerosis without significant stenosis. MAC without severe MR. No clear structural cause based on echo"  Laboratory Data:  High Sensitivity Troponin:  No results for input(s): TROPONINIHS in the last 720 hours.   Chemistry Recent Labs  Lab 05/27/20 1346  NA 137  K 4.9  CL 100  CO2 25  GLUCOSE 111*  BUN 24*  CREATININE 1.35*  CALCIUM 9.6  GFRNONAA 43*  GFRAA 50*  ANIONGAP 12    Recent Labs  Lab 05/27/20 1346  PROT 8.1  ALBUMIN 4.8  AST 25  ALT 14  ALKPHOS 51  BILITOT 1.0   Hematology Recent Labs  Lab 05/27/20 1347  WBC 6.5  RBC 4.43  HGB 14.2  HCT 43.3  MCV 97.7  MCH 32.1  MCHC 32.8  RDW 13.1  PLT 182   BNPNo results for input(s): BNP, PROBNP in the last 168 hours.  DDimer No results for  input(s): DDIMER in the last 168 hours.   Radiology/Studies:   DG Chest 2 View Result Date: 05/27/2020 CLINICAL DATA:  Fell today after losing balance. EXAM: CHEST - 2 VIEW COMPARISON:  11/06/2018 FINDINGS: Lordotic positioning. Heart size is normal. Hiatal hernia. Aortic atherosclerosis. The lungs are clear. No pneumothorax or hemothorax. No  visible rib fracture. Chronic thoracic kyphotic curvature. IMPRESSION: No acute or traumatic finding. Kyphotic curvature resulting in lordotic positioning. Hiatal hernia. Aortic atherosclerosis. Electronically Signed   By: Mark  Shogry M.D.   On: 05/27/2020 14:42    CT Head Wo Contrast Result Date: 05/27/2020 CLINICAL DATA:  Fell.  Hit the back of his head. EXAM: CT HEAD WITHOUT CONTRAST CT CERVICAL SPINE WITHOUT CONTRAST TECHNIQUE: Multidetector CT imaging of the head and cervical spine was performed following the standard protocol without intravenous contrast. Multiplanar CT image reconstructions of the cervical spine were also generated. COMPARISON:  CT neck dated Apr 08, 2014. FINDINGS: CT HEAD FINDINGS Brain: No evidence of acute infarction, hemorrhage, hydrocephalus, extra-axial collection or mass lesion/mass effect. Mild to moderate generalized cerebral atrophy. Scattered moderate to severe periventricular and subcortical white matter hypodensities are nonspecific, but favored to reflect chronic microvascular ischemic changes. Vascular: Calcified atherosclerosis at the skullbase. No hyperdense vessel. Skull: Normal. Negative for fracture or focal lesion. Sinuses/Orbits: No acute finding. Trace fluid in the left mastoid air cells inferiorly. Other: None. CT CERVICAL SPINE FINDINGS Alignment: No traumatic malalignment. Trace anterolisthesis at C7-T1. Skull base and vertebrae: No acute fracture. No primary bone lesion or focal pathologic process. Soft tissues and spinal canal: No prevertebral fluid or swelling. No visible canal hematoma. Disc levels: Multilevel disc height loss, moderate at C5-C6 and C6-C7. Mild multilevel facet uncovertebral hypertrophy. Ankylosis of the right C3-C4 facet joint. Upper chest: Biapical pleuroparenchymal scarring. Other: Unchanged calcifications in the right thyroid lobe. Bilateral carotid artery calcific atherosclerosis. IMPRESSION: 1. No acute intracranial abnormality. 2. No  acute cervical spine fracture or traumatic malalignment. Electronically Signed   By: William T Derry M.D.   On: 05/27/2020 14:29    Assessment and Plan:   1. Fall suspect syncope 2. High degree AVblock 2:1, and transient CHB has been noted on telemetry as well     No reversible causes  Recommend PPM Discussed with the patient and his daughters at bedside rational for PPM, we discussed implant procedure (for a traditional PPM) and it's potential benefits and risks They are all agreeable to proceed.  Echo has been done, Dr. Christopher this AM note mentions: "EF appears normal. Some aortic valve sclerosis without significant stenosis. MAC without severe MR. No clear structural cause based on echo"  I have placed the patient on the EP schedule for later today Dr. Idania Desouza will see him later this AM   For questions or updates, please contact [facility] Please consult [email or web address] for contact info under    Signed, Renee Lynn Ursuy, PA-C  05/28/2020 9:29 AM '  I have seen, examined the patient, and reviewed the above assessment and plan.  Changes to above are made where necessary.  On exam, RRR.  The patient presents with intermittent complete heart block.  He has advanced conduction system disease. The patient has symptomatic bradycardia.  I would therefore recommend pacemaker implantation at this time.  Risks, benefits, alternatives to pacemaker implantation were discussed in detail with the patient and his two daughters today. The patient understands that the risks include but are not limited to bleeding, infection, pneumothorax, perforation, tamponade, vascular damage, renal failure, MI,   stroke, death,  and lead dislodgement and wishes to proceed. We will therefore schedule the procedure at the next available time.  We also discussed remote monitoring and its role today.   Co Sign: Thompson Grayer, MD 05/28/2020 12:40 PM

## 2020-05-28 NOTE — Progress Notes (Signed)
Progress Note  Patient Name: [name] Date of Encounter: 05/28/2020  [facility] Cardiologist: Buford Dresser, MD   Subjective   Denies any events overnight. Reviewed history, summarized below. I discussed his telemetry findings with patient and his family at bedside. Denies chest pain, shortness of breath.  Inpatient Medications    Scheduled Meds:  amLODipine  5 mg Oral Daily   Chlorhexidine Gluconate Cloth  6 each Topical Daily   enalapril  20 mg Oral Daily   escitalopram  10 mg Oral Daily   feeding supplement (ENSURE ENLIVE)  237 mL Oral BID BM   furosemide  40 mg Oral Daily   levothyroxine  112 mcg Oral Q0600   LORazepam  0.5 mg Oral QHS   vitamin B-12  1,000 mcg Oral Daily   Continuous Infusions:  PRN Meds: docusate sodium, polyethylene glycol   Vital Signs    Vitals:   05/28/20 0500 05/28/20 0600 05/28/20 0747 05/28/20 0800  BP: (!) 145/77 124/65  (!) 149/71  Pulse: (!) 49 (!) 52  68  Resp: _0 Temp:   98.4 F (36.9 C)   TempSrc:   Oral   SpO2: 96% 97%  100%  Weight: 90.1 kg     Height:        Intake/Output Summary (Last 24 hours) at 05/28/2020 0901 Last data filed at 05/28/2020 0800 Gross per 24 hour  Intake 620.11 ml  Output 1250 ml  Net -629.89 ml   Last 3 Weights 05/28/2020 05/27/2020 05/18/2020  Weight (lbs) 198 lb 10.2 oz 235 lb 234 lb  Weight (kg) 90.1 kg 106.595 kg 106.142 kg      Telemetry    Frequent 2nd degree type I block, but also with episodes of consecutive nonconducted P waves with brief pause, nonconducted P waves with junctional rhythm, and 2:1 second degree AV block. He also quickly moves between HR 60-70s to HR around 30 bpm based on telemetry - Personally Reviewed  ECG    Multiple ECGs from yesterday reviewed: 15:51 SR with 1st degree block 14:34  Sinus rhythm with second degree type I (Wenkebach) and 2:1 AV block- Personally Reviewed  Prior ECG in 2019 with difficult baseline, appears to be sinus  rhythm with at least 1st degree AV block  Physical Exam   GEN: No acute distress.   Neck: No JVD Cardiac: predominantly regular S1 and S2 with occasional dropped beat. 1/6 systolic murmur Respiratory: Clear to auscultation bilaterally. GI: Soft, nontender, non-distended  MS: No edema; No deformity. Neuro:  Nonfocal  Psych: Normal affect   Labs    High Sensitivity Troponin:  No results for input(s): TROPONINIHS in the last 720 hours.    Chemistry Recent Labs  Lab 05/27/20 1346  NA 137  K 4.9  CL 100  CO2 25  GLUCOSE 111*  BUN 24*  CREATININE 1.35*  CALCIUM 9.6  PROT 8.1  ALBUMIN 4.8  AST 25  ALT 14  ALKPHOS 51  BILITOT 1.0  GFRNONAA 43*  GFRAA 50*  ANIONGAP 12     Hematology Recent Labs  Lab 05/27/20 1347  WBC 6.5  RBC 4.43  HGB 14.2  HCT 43.3  MCV 97.7  MCH 32.1  MCHC 32.8  RDW 13.1  PLT 182    BNPNo results for input(s): BNP, PROBNP in the last 168 hours.   DDimer No results for input(s): DDIMER in the last 168 hours.   Radiology    DG Chest 2 View  Result  Date: 05/27/2020 CLINICAL DATA:  [place] today after losing balance. EXAM: CHEST - 2 VIEW COMPARISON:  11/06/2018 FINDINGS: Lordotic positioning. Heart size is normal. Hiatal hernia. Aortic atherosclerosis. The lungs are clear. No pneumothorax or hemothorax. No visible rib fracture. Chronic thoracic kyphotic curvature. IMPRESSION: No acute or traumatic finding. Kyphotic curvature resulting in lordotic positioning. Hiatal hernia. Aortic atherosclerosis. Electronically Signed   By: Nelson Chimes M.D.   On: 05/27/2020 14:42   CT Head Wo Contrast  Result Date: 05/27/2020 CLINICAL DATA:  [place].  Hit the back of his head. EXAM: CT HEAD WITHOUT CONTRAST CT CERVICAL SPINE WITHOUT CONTRAST TECHNIQUE: Multidetector CT imaging of the head and cervical spine was performed following the standard protocol without intravenous contrast. Multiplanar CT image reconstructions of the cervical spine were also generated.  COMPARISON:  CT neck dated Apr 08, 2014. FINDINGS: CT HEAD FINDINGS Brain: No evidence of acute infarction, hemorrhage, hydrocephalus, extra-axial collection or mass lesion/mass effect. Mild to moderate generalized cerebral atrophy. Scattered moderate to severe periventricular and subcortical white matter hypodensities are nonspecific, but favored to reflect chronic microvascular ischemic changes. Vascular: Calcified atherosclerosis at the skullbase. No hyperdense vessel. Skull: Normal. Negative for fracture or focal lesion. Sinuses/Orbits: No acute finding. Trace fluid in the left mastoid air cells inferiorly. Other: None. CT CERVICAL SPINE FINDINGS Alignment: No traumatic malalignment. Trace anterolisthesis at C7-T1. Skull base and vertebrae: No acute fracture. No primary bone lesion or focal pathologic process. Soft tissues and spinal canal: No prevertebral fluid or swelling. No visible canal hematoma. Disc levels: Multilevel disc height loss, moderate at C5-C6 and C6-C7. Mild multilevel facet uncovertebral hypertrophy. Ankylosis of the right C3-C4 facet joint. Upper chest: Biapical pleuroparenchymal scarring. Other: Unchanged calcifications in the right thyroid lobe. Bilateral carotid artery calcific atherosclerosis. IMPRESSION: 1. No acute intracranial abnormality. 2. No acute cervical spine fracture or traumatic malalignment. Electronically Signed   By: Titus Dubin M.D.   On: 05/27/2020 14:29   CT Cervical Spine Wo Contrast  Result Date: 05/27/2020 CLINICAL DATA:  [place].  Hit the back of his head. EXAM: CT HEAD WITHOUT CONTRAST CT CERVICAL SPINE WITHOUT CONTRAST TECHNIQUE: Multidetector CT imaging of the head and cervical spine was performed following the standard protocol without intravenous contrast. Multiplanar CT image reconstructions of the cervical spine were also generated. COMPARISON:  CT neck dated Apr 08, 2014. FINDINGS: CT HEAD FINDINGS Brain: No evidence of acute infarction, hemorrhage,  hydrocephalus, extra-axial collection or mass lesion/mass effect. Mild to moderate generalized cerebral atrophy. Scattered moderate to severe periventricular and subcortical white matter hypodensities are nonspecific, but favored to reflect chronic microvascular ischemic changes. Vascular: Calcified atherosclerosis at the skullbase. No hyperdense vessel. Skull: Normal. Negative for fracture or focal lesion. Sinuses/Orbits: No acute finding. Trace fluid in the left mastoid air cells inferiorly. Other: None. CT CERVICAL SPINE FINDINGS Alignment: No traumatic malalignment. Trace anterolisthesis at C7-T1. Skull base and vertebrae: No acute fracture. No primary bone lesion or focal pathologic process. Soft tissues and spinal canal: No prevertebral fluid or swelling. No visible canal hematoma. Disc levels: Multilevel disc height loss, moderate at C5-C6 and C6-C7. Mild multilevel facet uncovertebral hypertrophy. Ankylosis of the right C3-C4 facet joint. Upper chest: Biapical pleuroparenchymal scarring. Other: Unchanged calcifications in the right thyroid lobe. Bilateral carotid artery calcific atherosclerosis. IMPRESSION: 1. No acute intracranial abnormality. 2. No acute cervical spine fracture or traumatic malalignment. Electronically Signed   By: Titus Dubin M.D.   On: 05/27/2020 14:29    Cardiac Studies   Echo done this  AM, my unofficial read below  Patient Profile     86 y.o. male who has generally been very functional and healthy presented after fall. Circumstances unclear, though he denies syncope. Found to have high grade conduction disease on ECG/telemetry, cardiology asked to admit/evaluate further.  Assessment & Plan    Fall: he feels strongly he did not lose consciousness. However, does not have any prodrome, no clear cause of fall, and cannot clearly remember the entire fall. Family at bedside states he has frequent intermittent lightheadedness without syncope and also frequent episodes when he  is sitting still and starts to feel poorly. No chest pain, shortness of breath. Functional activity severely decreased in recent weeks--used to be extremely active, now easily fatigued and can do very minimal activity. -on my review, EF appears normal. Some aortic valve sclerosis without significant stenosis. MAC without severe MR. No clear structural cause based on echo -telemetry above. Intermittent 2nd degree type I AV block, 2nd degree 2:1 AV block, and nonconducted P waves with junctional rhythm -was on no AV nodal agents at home (amlodipine without significant AV nodal effect) -I will ask EP to weigh in to see if pacemaker may be beneficial. Difficult to evaluate full cause of his symptoms, but with lack of prodrome, concerning for conduction system disease as cause. Leadless may be the best option if available, but will defer to EP team. -given his high grade conduction disease, unclear etiology of his fall, and intermittent bradycardia, he is appropriate for ICU for close monitoring and initiation of transcutaneous pacing if required.  Hypertension: may be compensatory due to intermittent bradycardia. Will hold on increasing for now  Hypothyroidism: recent TSH WNL  Complicated grief/depression: will continue home meds for now, though patient and family hope to wean off.  For questions or updates, please contact [place] Please consult [email or web address] for contact info under     Signed, Buford Dresser, MD  05/28/2020, 9:01 AM

## 2020-05-29 ENCOUNTER — Encounter (HOSPITAL_COMMUNITY): Payer: Self-pay | Admitting: Internal Medicine

## 2020-05-29 ENCOUNTER — Inpatient Hospital Stay (HOSPITAL_COMMUNITY): Payer: Medicare Other

## 2020-05-29 NOTE — Discharge Instructions (Signed)
    Supplemental Discharge Instructions for  Pacemaker/Defibrillator Patients  Activity No heavy lifting or vigorous activity with your left/right arm for 6 to 8 weeks.  Do not raise your left/right arm above your head for one week.  Gradually raise your affected arm as drawn below.             06/01/2020                   06/02/2020                  06/03/2020                06/04/2020 __  NO DRIVING patient does not drive.  WOUND CARE - Keep the wound area clean and dry.  Do not get this area wet, no showers until cleared to at your wound check visitThe tape/steri-strips on your wound will fall off; do not pull them off.  No bandage is needed on the site.  DO  NOT apply any creams, oils, or ointments to the wound area. - If you notice any drainage or discharge from the wound, any swelling or bruising at the site, or you develop a fever > 101? F after you are discharged home, call the office at once.  Special Instructions - You are still able to use cellular telephones; use the ear opposite the side where you have your pacemaker/defibrillator.  Avoid carrying your cellular phone near your device. - When traveling through airports, show security personnel your identification card to avoid being screened in the metal detectors.  Ask the security personnel to use the hand wand. - Avoid arc welding equipment, MRI testing (magnetic resonance imaging), TENS units (transcutaneous nerve stimulators).  Call the office for questions about other devices. - Avoid electrical appliances that are in poor condition or are not properly grounded. - Microwave ovens are safe to be near or to operate.

## 2020-05-29 NOTE — Progress Notes (Signed)
Doing well s/p PPM  Device interrogation is normal (personally reviewed).  CXR reveals stable leads, no ptx.   Ok to discharge with close outpatient follow-up Routine wound care and follow-up  Thompson Grayer MD, [place] 05/29/2020 10:55 AM

## 2020-05-29 NOTE — Discharge Summary (Signed)
ELECTROPHYSIOLOGY PROCEDURE DISCHARGE SUMMARY    Patient ID: [name],  MRN: [id number], DOB/AGE: 1919/03/26 86 y.o.  Admit date: 05/27/2020 Discharge date: 05/29/2020  Primary Care Physician: Susy Frizzle, MD  Primary Cardiologist: new to [facility] Electrophysiologist: new to Dr. Rayann Heman  Primary Discharge Diagnosis:  1. Symptomatic bradycardia  2. Advanced heeart block including CHB  Secondary Discharge Diagnosis:  1. HTN 2. Chronic venous insufficiency 3. CKD (II) 4. H/o NHL  Allergies  Allergen Reactions  . Codeine Nausea And Vomiting  . Percocet [Oxycodone-Acetaminophen] Nausea And Vomiting     Procedures This Admission:  1.  Implantation of a SJM dual chamber PPM on 05/28/2020 by Dr Rayann Heman.  The patient received [employer]lass MRI model [id number] (serial number  [id number]) pacemaker, [employer] Tendril MRI model LPA1200M- 52 (serial number  [id number]) right atrial lead and a [employer] Tendril MRI model [id number]  (serial number  [id number]) right ventricular lead There were no immediate post procedure complications. 2.  CXR on 05/29/2020 demonstrated no pneumothorax status post device implantation.     Brief HPI: [name] is a 86 y.o. male was referred to the ER at a PMD visit with dizziness, this perhaps not new, but worse of late with a uncertain mechanism of a fall.  He was found to have high degree AVblock at [facility] and transferred to [facility] for further evaluation and management.   Hospital Course:  The patient was observed to have SR rates 70's with long 1st degree AVBlock with very frequent and prolonged wpisodes of 2:1 AV block and transient CHB as well with HR dipping to the 20's at times. Record noted He had a PMD visit 05/18/20, mentioning dizziness being treated at home with meclizine that seemed to help, also with anxiety/depression since the passing of his wife. This note mentions extremely sedentary and basically confined to a  wheelchair  The family at bedside disagree with this.  He is ambulatory with his walker and independently does his ADLs, he has excellent family support and lives with his children, but cares for himself basically.,  He did have a dip in energy of late that they blamed on depression after the death of his wife.  He deniesdany CP, palpitations.  He had been depressed of late with less energy and not eating as well as he used to, he says he has been sad with the death of his wife, but getting better.  He said he went to the bathroom, and once inside fell and hit the back of his head.  He does not know why/how he fell, just suddenly fell and hit his head.  CT head negative.  Cardiology admitted to ICU noting no reversible causes, TTE was preliminarily reviewed by Dr. Harrell Gave at bedside who noted normal LVEF, no WMA and not felt to have any structural causes for his heart block or to be ischemic.  EP was brought on board for consideratio of PPM. His BP remained stable and in bed without symptoms.  He  underwent implantation of a PPM with details as outlined above. He was monitored on telemetry overnight which demonstrated SR/V pacing, infrequently conducted SR.  Left chest was without hematoma or ecchymosis.  The device was interrogated and found to be functioning normally.  CXR was obtained and demonstrated no pneumothorax status post device implantation.  Wound care, arm mobility, and restrictions were reviewed with the patient and his family at bedside.  The patient feels  better this morning, more energy.  No CP or SOB, he has ambulated in the hall with a walker (his baseline) did well, and remarked to the nurse he was surprised how good he felt.  He was examined by Dr. Rayann Heman and considered stable for discharge to home.    Physical Exam: Vitals:   05/29/20 0804 05/29/20 0900 05/29/20 1000 05/29/20 1100  BP:  109/72 (!) 149/71 (!) 124/103  Pulse:  71 71 80  Resp:  19 16 18   Temp: 98.7 F (37.1  C)     TempSrc: Axillary     SpO2:  99% 96% 100%  Weight:      Height:        GEN- The patient is well appearing, alert and oriented x 3 today.   HEENT: normocephalic, atraumatic, no signs of trauma are appreciated; sclera clear, conjunctiva pink; hearing intact; oropharynx clear; neck supple, no JVP Lungs- CTA b/l, normal work of breathing.  No wheezes, rales, rhonchi Heart-  RRR, no murmurs, rubs or gallops GI- soft, non-tender, non-distendedenomegaly Extremities- no clubbing, cyanosis, or edema MS- no significant deformity, age appropriate atrophy Skin- warm and dry, no rash or lesion, left chest without hematoma slight ecchymosis Psych- euthymic mood, full affect Neuro- no gross deficits   Labs:   Lab Results  Component Value Date   WBC 6.5 05/27/2020   HGB 14.2 05/27/2020   HCT 43.3 05/27/2020   MCV 97.7 05/27/2020   PLT 182 05/27/2020    Recent Labs  Lab 05/27/20 1346  NA 137  K 4.9  CL 100  CO2 25  BUN 24*  CREATININE 1.35*  CALCIUM 9.6  PROT 8.1  BILITOT 1.0  ALKPHOS 51  ALT 14  AST 25  GLUCOSE 111*    Discharge Medications:  Allergies as of 05/29/2020      Reactions   Codeine Nausea And Vomiting   Percocet [oxycodone-acetaminophen] Nausea And Vomiting      Medication List    TAKE these medications   amLODipine 5 MG tablet Commonly known as: NORVASC TAKE 1 TABLET BY MOUTH  DAILY   aspirin EC 81 MG tablet Take 1 tablet (81 mg total) by mouth daily.   enalapril 20 MG tablet Commonly known as: VASOTEC TAKE 1 TABLET BY MOUTH  DAILY   escitalopram 10 MG tablet Commonly known as: Lexapro Take 1 tablet (10 mg total) by mouth daily.   furosemide 20 MG tablet Commonly known as: LASIX TAKE 2 TABLETS BY MOUTH  EVERY DAY Notes to patient: Continue this medicines as your primary doctor has advised   levothyroxine 112 MCG tablet Commonly known as: SYNTHROID TAKE 1 TABLET BY MOUTH  DAILY   LORazepam 0.5 MG tablet Commonly known as:  ATIVAN Take 1 tablet (0.5 mg total) by mouth every 8 (eight) hours as needed for anxiety.   vitamin B-12 1000 MCG tablet Commonly known as: CYANOCOBALAMIN Take 1 tablet (1,000 mcg total) by mouth daily.            Discharge Care Instructions  (From admission, onward)         Start     Ordered   05/29/20 0000  Discharge wound care:       Comments: Wound care as noted in discharge instructions   05/29/20 1204          Disposition: Home (with excellent family support) Discharge Instructions    Diet - low sodium heart healthy   Complete by: As directed    Discharge  wound care:   Complete by: As directed    Wound care as noted in discharge instructions   Increase activity slowly   Complete by: As directed       Follow-up Information    [place] [facility] Follow up.   Specialty: Cardiology Why: 06/11/2020 @ 4:00PM, wound check visit Contact information: [address] [place]       Thompson Grayer, MD Follow up.   Specialty: Cardiology Why: 09/07/2020 @ 2:30PM Contact information: [place] [address] [phone]               Duration of Discharge Encounter: Greater than 30 minutes including physician time.  Venetia Night, PA-C 05/29/2020 12:06 PM

## 2020-05-29 NOTE — Progress Notes (Signed)
Per night shift, some disorientation overnight that was easily redirected and is now resolved Pt's 2 daughters stayed overnight at bedside BM this AM Possible d/c today, pending ambulation Ambulated independently with FWW approx 172ft. Mild dizziness reported as baseline; otherwise NAD VSS  Skin tear redressed with education provided PIV removed without incident Pt dressed; ensured no belongings remain in room Escorted by wheelchair to POV VSS, NAD

## 2020-06-03 ENCOUNTER — Other Ambulatory Visit: Payer: Self-pay | Admitting: Family Medicine

## 2020-06-03 DIAGNOSIS — J189 Pneumonia, unspecified organism: Secondary | ICD-10-CM

## 2020-06-08 ENCOUNTER — Telehealth: Payer: Self-pay

## 2020-06-08 NOTE — Telephone Encounter (Signed)
[name] was hospitalized a week ago this coming Thursday. His daughter is asking if he can get Therapy started as soon as possible. And if you would like to see him?

## 2020-06-08 NOTE — Telephone Encounter (Signed)
I have addended note as requested, please schedule home health pt.

## 2020-06-09 ENCOUNTER — Other Ambulatory Visit: Payer: Self-pay | Admitting: Family Medicine

## 2020-06-11 ENCOUNTER — Other Ambulatory Visit: Payer: Self-pay

## 2020-06-11 ENCOUNTER — Ambulatory Visit (INDEPENDENT_AMBULATORY_CARE_PROVIDER_SITE_OTHER): Payer: Medicare Other | Admitting: Emergency Medicine

## 2020-06-11 DIAGNOSIS — I459 Conduction disorder, unspecified: Secondary | ICD-10-CM | POA: Diagnosis not present

## 2020-06-11 NOTE — Patient Instructions (Signed)
Please keep area clean and dry until next wound check follow-up. Called device clinic [phone] if you notice any increased swelling, drainage, fever or chills.

## 2020-06-12 LAB — CUP PACEART INCLINIC DEVICE CHECK
Battery Remaining Longevity: 111 mo
Battery Voltage: 3.07 V
Brady Statistic RA Percent Paced: 8.4 %
Brady Statistic RV Percent Paced: 96 %
Date Time Interrogation Session: 20210715162600
Implantable Lead Implant Date: 20210701
Implantable Lead Implant Date: 20210701
Implantable Lead Location: 753859
Implantable Lead Location: 753860
Implantable Pulse Generator Implant Date: 20210701
Lead Channel Impedance Value: 412.5 Ohm
Lead Channel Impedance Value: 537.5 Ohm
Lead Channel Pacing Threshold Amplitude: 0.5 V
Lead Channel Pacing Threshold Amplitude: 0.75 V
Lead Channel Pacing Threshold Amplitude: 0.75 V
Lead Channel Pacing Threshold Pulse Width: 0.5 ms
Lead Channel Pacing Threshold Pulse Width: 0.5 ms
Lead Channel Pacing Threshold Pulse Width: 0.5 ms
Lead Channel Sensing Intrinsic Amplitude: 12 mV
Lead Channel Sensing Intrinsic Amplitude: 2.6 mV
Lead Channel Setting Pacing Amplitude: 0.75 V
Lead Channel Setting Pacing Amplitude: 3.5 V
Lead Channel Setting Pacing Pulse Width: 0.5 ms
Lead Channel Setting Sensing Sensitivity: 2 mV
Pulse Gen Model: 2272
Pulse Gen Serial Number: 3842377

## 2020-06-12 NOTE — Progress Notes (Signed)
Wound check appointment. Steri-strips removed. Approx. 1/2 cm of skin not approximated at incision site. Cleaned area of any existing blood, applied steri-strips to site. No yellow or green drainage noted to site. Wound without redness or edema. ,  Normal device function. Thresholds, sensing, and impedances consistent with implant measurements. Device programmed (RV) 3.5V for extra safety margin until 3 month visit . (RV) output programmed with on auto capture on. Histogram distribution appropriate for patient and level of activity. AT/AF burden <1%, 4 PMT episodes occured, unable to reproduce in office. Patient educated about wound care, arm mobility, lifting restrictions. ROV in 3 months with implanting physician. Next home remote 08/28/2020 Wound recheck scheduled for 06/16/20 @ 3:30 PM.

## 2020-06-15 ENCOUNTER — Telehealth: Payer: Self-pay | Admitting: Family Medicine

## 2020-06-15 NOTE — Telephone Encounter (Signed)
CB# [phone] Vaughan Basta from [facility]  call need a verbal order PT  to see pt because Nanine Means not able to see pt

## 2020-06-15 NOTE — Telephone Encounter (Signed)
Please call and give her a verbal order.

## 2020-06-16 ENCOUNTER — Ambulatory Visit (INDEPENDENT_AMBULATORY_CARE_PROVIDER_SITE_OTHER): Payer: Medicare Other | Admitting: Emergency Medicine

## 2020-06-16 ENCOUNTER — Other Ambulatory Visit: Payer: Self-pay

## 2020-06-16 DIAGNOSIS — I459 Conduction disorder, unspecified: Secondary | ICD-10-CM

## 2020-06-16 NOTE — Patient Instructions (Signed)
May shower/ get incision wet.  Left arm restrictions- Do not push/ pull or lift greater than 10lbs. With left arm.  All other activity as tolerated.

## 2020-06-16 NOTE — Progress Notes (Signed)
Wound recheck completed.  3 steri strips removed.  A scant amount of dried blood noted on steri-strips.  Wound edges well approximated.  No active bleeding noted.  Patient and son educated on activity restrictions.

## 2020-06-16 NOTE — Telephone Encounter (Signed)
VO given.

## 2020-06-17 ENCOUNTER — Telehealth: Payer: Self-pay | Admitting: *Deleted

## 2020-06-17 DIAGNOSIS — R29898 Other symptoms and signs involving the musculoskeletal system: Secondary | ICD-10-CM | POA: Diagnosis not present

## 2020-06-17 DIAGNOSIS — I129 Hypertensive chronic kidney disease with stage 1 through stage 4 chronic kidney disease, or unspecified chronic kidney disease: Secondary | ICD-10-CM | POA: Diagnosis not present

## 2020-06-17 DIAGNOSIS — Z48812 Encounter for surgical aftercare following surgery on the circulatory system: Secondary | ICD-10-CM | POA: Diagnosis not present

## 2020-06-17 DIAGNOSIS — I442 Atrioventricular block, complete: Secondary | ICD-10-CM | POA: Diagnosis not present

## 2020-06-17 DIAGNOSIS — K219 Gastro-esophageal reflux disease without esophagitis: Secondary | ICD-10-CM | POA: Diagnosis not present

## 2020-06-17 DIAGNOSIS — R6881 Early satiety: Secondary | ICD-10-CM | POA: Diagnosis not present

## 2020-06-17 DIAGNOSIS — I872 Venous insufficiency (chronic) (peripheral): Secondary | ICD-10-CM | POA: Diagnosis not present

## 2020-06-17 DIAGNOSIS — I739 Peripheral vascular disease, unspecified: Secondary | ICD-10-CM | POA: Diagnosis not present

## 2020-06-17 DIAGNOSIS — R296 Repeated falls: Secondary | ICD-10-CM | POA: Diagnosis not present

## 2020-06-17 DIAGNOSIS — N182 Chronic kidney disease, stage 2 (mild): Secondary | ICD-10-CM | POA: Diagnosis not present

## 2020-06-17 DIAGNOSIS — R001 Bradycardia, unspecified: Secondary | ICD-10-CM | POA: Diagnosis not present

## 2020-06-17 NOTE — Telephone Encounter (Signed)
Received call from Tressia Danas, [facility] PT with [place]. [phone]~ telephone.   Reports that PT evaluation was completed today and requested VO to extend PT services 1x weekly x1 week, then 2x weekly x3 weeks.   PT did not leave plan of care. Call placed to inquire. [place].

## 2020-06-17 NOTE — Telephone Encounter (Signed)
Received return call from Tressia Danas, [facility] PT with [facility].   Reports that plan of care is if balance, gait, strengthening, safety, education, home exercise program, fall preventions, and focusing on L foot drop.   VO given.

## 2020-06-22 DIAGNOSIS — Z48812 Encounter for surgical aftercare following surgery on the circulatory system: Secondary | ICD-10-CM | POA: Diagnosis not present

## 2020-06-22 DIAGNOSIS — I739 Peripheral vascular disease, unspecified: Secondary | ICD-10-CM | POA: Diagnosis not present

## 2020-06-22 DIAGNOSIS — I442 Atrioventricular block, complete: Secondary | ICD-10-CM | POA: Diagnosis not present

## 2020-06-22 DIAGNOSIS — K219 Gastro-esophageal reflux disease without esophagitis: Secondary | ICD-10-CM | POA: Diagnosis not present

## 2020-06-22 DIAGNOSIS — R29898 Other symptoms and signs involving the musculoskeletal system: Secondary | ICD-10-CM | POA: Diagnosis not present

## 2020-06-22 DIAGNOSIS — I129 Hypertensive chronic kidney disease with stage 1 through stage 4 chronic kidney disease, or unspecified chronic kidney disease: Secondary | ICD-10-CM | POA: Diagnosis not present

## 2020-06-22 DIAGNOSIS — R6881 Early satiety: Secondary | ICD-10-CM | POA: Diagnosis not present

## 2020-06-22 DIAGNOSIS — R001 Bradycardia, unspecified: Secondary | ICD-10-CM | POA: Diagnosis not present

## 2020-06-22 DIAGNOSIS — I872 Venous insufficiency (chronic) (peripheral): Secondary | ICD-10-CM | POA: Diagnosis not present

## 2020-06-22 DIAGNOSIS — R296 Repeated falls: Secondary | ICD-10-CM | POA: Diagnosis not present

## 2020-06-22 DIAGNOSIS — N182 Chronic kidney disease, stage 2 (mild): Secondary | ICD-10-CM | POA: Diagnosis not present

## 2020-06-23 ENCOUNTER — Ambulatory Visit: Payer: Medicare Other | Admitting: Podiatry

## 2020-06-23 ENCOUNTER — Encounter: Payer: Self-pay | Admitting: Podiatry

## 2020-06-23 ENCOUNTER — Other Ambulatory Visit: Payer: Self-pay

## 2020-06-23 DIAGNOSIS — M7752 Other enthesopathy of left foot: Secondary | ICD-10-CM | POA: Diagnosis not present

## 2020-06-23 DIAGNOSIS — L909 Atrophic disorder of skin, unspecified: Secondary | ICD-10-CM

## 2020-06-23 DIAGNOSIS — M7751 Other enthesopathy of right foot: Secondary | ICD-10-CM | POA: Diagnosis not present

## 2020-06-24 ENCOUNTER — Encounter: Payer: Self-pay | Admitting: Podiatry

## 2020-06-24 NOTE — Progress Notes (Signed)
Subjective:  Patient ID: [name], male    DOB: [birth year],  MRN: [id number]  Chief Complaint  Patient presents with   Foot Pain    Patient presents today for right foot/toe pain.  He says it hurts worse in the evenings    86 y.o. male presents with the above complaint.  Patient presents with a complaint of right first MPJ capsulitis and left fourth MTPJ capsulitis.  Patient states is very painful in nature.  He states that he was seen here 1 year ago by Dr. March Rummage who gave him an injection in the interspaces which tend to help a lot.  Patient would like another set of injections as it gave him about a year of relief.  He denies any other acute complaints.  His symptoms have not changed since last visit a year ago.  He has not seen anyone else since then.  His pain is dull achy in nature is 3 out of 10.   Review of Systems: Negative except as noted in the HPI. Denies N/V/F/Ch.  Past Medical History:  Diagnosis Date   Acid reflux    Cancer ([place]) 09/05/13   left neck-non-hodgkins lymphoma   Hypertension    requires diuretic   Hypothyroidism    Malaria    while in the service   Peripheral vascular disease ([place])    slow wound healing on legs    Current Outpatient Medications:    amLODipine (NORVASC) 5 MG tablet, TAKE 1 TABLET BY MOUTH  DAILY, Disp: 90 tablet, Rfl: 3   aspirin EC 81 MG tablet, Take 1 tablet (81 mg total) by mouth daily., Disp: , Rfl:    enalapril (VASOTEC) 20 MG tablet, TAKE 1 TABLET BY MOUTH  DAILY (Patient taking differently: Take 20 mg by mouth daily. ), Disp: 90 tablet, Rfl: 3   escitalopram (LEXAPRO) 10 MG tablet, TAKE 1 TABLET BY MOUTH EVERY DAY, Disp: 90 tablet, Rfl: 2   furosemide (LASIX) 20 MG tablet, TAKE 2 TABLETS BY MOUTH  EVERY DAY (Patient taking differently: Take 40 mg by mouth daily. ), Disp: 180 tablet, Rfl: 3   levothyroxine (SYNTHROID) 112 MCG tablet, TAKE 1 TABLET BY MOUTH  DAILY, Disp: 90 tablet, Rfl: 3   LORazepam (ATIVAN) 0.5 MG  tablet, Take 1 tablet (0.5 mg total) by mouth every 8 (eight) hours as needed for anxiety., Disp: 60 tablet, Rfl: 1   vitamin B-12 (CYANOCOBALAMIN) 1000 MCG tablet, Take 1 tablet (1,000 mcg total) by mouth daily., Disp: 30 tablet, Rfl: 5  Social History   Tobacco Use  Smoking Status Former Smoker   Types: Cigars  Smokeless Tobacco Former Systems developer   Quit date: 10/05/1979    Allergies  Allergen Reactions   Codeine Nausea And Vomiting   Percocet [Oxycodone-Acetaminophen] Nausea And Vomiting   Objective:  There were no vitals filed for this visit. There is no height or weight on file to calculate BMI. Constitutional Well developed. Well nourished.  Vascular Dorsalis pedis pulses palpable bilaterally. Posterior tibial pulses palpable bilaterally. Capillary refill normal to all digits.  No cyanosis or clubbing noted. Pedal hair growth normal.  Neurologic Normal speech. Oriented to person, place, and time. Epicritic sensation to light touch grossly present bilaterally.  Dermatologic Nails well groomed and normal in appearance. No open wounds. No skin lesions.  Orthopedic:  Pain on palpation to the first interspace at the first MTPJ joint.  Mild pain with range of motion of the MTPJ joint.  No intra-articular pain noted.  No extensor tendinitis or flexor tendinitis noted.  Negative Mulder's click or neuroma symptoms  Left fourth MTPJ joint capsulitis with range of motion of fourth MTPJ joint.  No intra-articular pain noted.  Negative Mulder's click or neuroma symptoms.   Radiographs: None Assessment:   1. Capsulitis of metatarsophalangeal (MTP) joint of right foot   2. Capsulitis of metatarsophalangeal (MTP) joint of left foot   3. Fat pad atrophy of foot    Plan:  Patient was evaluated and treated and all questions answered.  Right first MTPJ capsulitis and left fourth MTP capsulitis with underlying fat pad atrophy Patient the etiology of capsulitis and various treatment  options were discussed.  Given his age he is likely putting excessive stress on the joint leading to inflammation and therefore pain associated with it with fat pad atrophy making it easier to put excessive stress on the bone..  I believe patient will benefit from a steroid injection to both of those areas as he has previously received injections in those areas.  Patient agrees with the plan would like to proceed with injection. -A steroid injection was performed at right first MTPJ joint and left fourth MTPJ joint using 1% plain Lidocaine and 10 mg of Kenalog. This was well tolerated.   No follow-ups on file.

## 2020-06-25 DIAGNOSIS — R296 Repeated falls: Secondary | ICD-10-CM | POA: Diagnosis not present

## 2020-06-25 DIAGNOSIS — R29898 Other symptoms and signs involving the musculoskeletal system: Secondary | ICD-10-CM | POA: Diagnosis not present

## 2020-06-25 DIAGNOSIS — I739 Peripheral vascular disease, unspecified: Secondary | ICD-10-CM | POA: Diagnosis not present

## 2020-06-25 DIAGNOSIS — I442 Atrioventricular block, complete: Secondary | ICD-10-CM | POA: Diagnosis not present

## 2020-06-25 DIAGNOSIS — K219 Gastro-esophageal reflux disease without esophagitis: Secondary | ICD-10-CM | POA: Diagnosis not present

## 2020-06-25 DIAGNOSIS — R001 Bradycardia, unspecified: Secondary | ICD-10-CM | POA: Diagnosis not present

## 2020-06-25 DIAGNOSIS — N182 Chronic kidney disease, stage 2 (mild): Secondary | ICD-10-CM | POA: Diagnosis not present

## 2020-06-25 DIAGNOSIS — I129 Hypertensive chronic kidney disease with stage 1 through stage 4 chronic kidney disease, or unspecified chronic kidney disease: Secondary | ICD-10-CM | POA: Diagnosis not present

## 2020-06-25 DIAGNOSIS — H353233 Exudative age-related macular degeneration, bilateral, with inactive scar: Secondary | ICD-10-CM | POA: Diagnosis not present

## 2020-06-25 DIAGNOSIS — Z48812 Encounter for surgical aftercare following surgery on the circulatory system: Secondary | ICD-10-CM | POA: Diagnosis not present

## 2020-06-25 DIAGNOSIS — I872 Venous insufficiency (chronic) (peripheral): Secondary | ICD-10-CM | POA: Diagnosis not present

## 2020-06-25 DIAGNOSIS — R6881 Early satiety: Secondary | ICD-10-CM | POA: Diagnosis not present

## 2020-06-30 DIAGNOSIS — R29898 Other symptoms and signs involving the musculoskeletal system: Secondary | ICD-10-CM | POA: Diagnosis not present

## 2020-06-30 DIAGNOSIS — R6881 Early satiety: Secondary | ICD-10-CM | POA: Diagnosis not present

## 2020-06-30 DIAGNOSIS — I129 Hypertensive chronic kidney disease with stage 1 through stage 4 chronic kidney disease, or unspecified chronic kidney disease: Secondary | ICD-10-CM | POA: Diagnosis not present

## 2020-06-30 DIAGNOSIS — N182 Chronic kidney disease, stage 2 (mild): Secondary | ICD-10-CM | POA: Diagnosis not present

## 2020-06-30 DIAGNOSIS — Z48812 Encounter for surgical aftercare following surgery on the circulatory system: Secondary | ICD-10-CM | POA: Diagnosis not present

## 2020-06-30 DIAGNOSIS — K219 Gastro-esophageal reflux disease without esophagitis: Secondary | ICD-10-CM | POA: Diagnosis not present

## 2020-06-30 DIAGNOSIS — I442 Atrioventricular block, complete: Secondary | ICD-10-CM | POA: Diagnosis not present

## 2020-06-30 DIAGNOSIS — R001 Bradycardia, unspecified: Secondary | ICD-10-CM | POA: Diagnosis not present

## 2020-06-30 DIAGNOSIS — I872 Venous insufficiency (chronic) (peripheral): Secondary | ICD-10-CM | POA: Diagnosis not present

## 2020-06-30 DIAGNOSIS — R296 Repeated falls: Secondary | ICD-10-CM | POA: Diagnosis not present

## 2020-06-30 DIAGNOSIS — I739 Peripheral vascular disease, unspecified: Secondary | ICD-10-CM | POA: Diagnosis not present

## 2020-07-03 DIAGNOSIS — I442 Atrioventricular block, complete: Secondary | ICD-10-CM | POA: Diagnosis not present

## 2020-07-03 DIAGNOSIS — R001 Bradycardia, unspecified: Secondary | ICD-10-CM | POA: Diagnosis not present

## 2020-07-03 DIAGNOSIS — K219 Gastro-esophageal reflux disease without esophagitis: Secondary | ICD-10-CM | POA: Diagnosis not present

## 2020-07-03 DIAGNOSIS — I872 Venous insufficiency (chronic) (peripheral): Secondary | ICD-10-CM | POA: Diagnosis not present

## 2020-07-03 DIAGNOSIS — R29898 Other symptoms and signs involving the musculoskeletal system: Secondary | ICD-10-CM | POA: Diagnosis not present

## 2020-07-03 DIAGNOSIS — I739 Peripheral vascular disease, unspecified: Secondary | ICD-10-CM | POA: Diagnosis not present

## 2020-07-03 DIAGNOSIS — R6881 Early satiety: Secondary | ICD-10-CM | POA: Diagnosis not present

## 2020-07-03 DIAGNOSIS — I129 Hypertensive chronic kidney disease with stage 1 through stage 4 chronic kidney disease, or unspecified chronic kidney disease: Secondary | ICD-10-CM | POA: Diagnosis not present

## 2020-07-03 DIAGNOSIS — R296 Repeated falls: Secondary | ICD-10-CM | POA: Diagnosis not present

## 2020-07-03 DIAGNOSIS — N182 Chronic kidney disease, stage 2 (mild): Secondary | ICD-10-CM | POA: Diagnosis not present

## 2020-07-03 DIAGNOSIS — Z48812 Encounter for surgical aftercare following surgery on the circulatory system: Secondary | ICD-10-CM | POA: Diagnosis not present

## 2020-07-07 DIAGNOSIS — I739 Peripheral vascular disease, unspecified: Secondary | ICD-10-CM | POA: Diagnosis not present

## 2020-07-07 DIAGNOSIS — R29898 Other symptoms and signs involving the musculoskeletal system: Secondary | ICD-10-CM | POA: Diagnosis not present

## 2020-07-07 DIAGNOSIS — I872 Venous insufficiency (chronic) (peripheral): Secondary | ICD-10-CM | POA: Diagnosis not present

## 2020-07-07 DIAGNOSIS — I129 Hypertensive chronic kidney disease with stage 1 through stage 4 chronic kidney disease, or unspecified chronic kidney disease: Secondary | ICD-10-CM | POA: Diagnosis not present

## 2020-07-07 DIAGNOSIS — R296 Repeated falls: Secondary | ICD-10-CM | POA: Diagnosis not present

## 2020-07-07 DIAGNOSIS — N182 Chronic kidney disease, stage 2 (mild): Secondary | ICD-10-CM | POA: Diagnosis not present

## 2020-07-07 DIAGNOSIS — R6881 Early satiety: Secondary | ICD-10-CM | POA: Diagnosis not present

## 2020-07-07 DIAGNOSIS — K219 Gastro-esophageal reflux disease without esophagitis: Secondary | ICD-10-CM | POA: Diagnosis not present

## 2020-07-07 DIAGNOSIS — R001 Bradycardia, unspecified: Secondary | ICD-10-CM | POA: Diagnosis not present

## 2020-07-07 DIAGNOSIS — I442 Atrioventricular block, complete: Secondary | ICD-10-CM | POA: Diagnosis not present

## 2020-07-07 DIAGNOSIS — Z48812 Encounter for surgical aftercare following surgery on the circulatory system: Secondary | ICD-10-CM | POA: Diagnosis not present

## 2020-07-09 DIAGNOSIS — I129 Hypertensive chronic kidney disease with stage 1 through stage 4 chronic kidney disease, or unspecified chronic kidney disease: Secondary | ICD-10-CM | POA: Diagnosis not present

## 2020-07-09 DIAGNOSIS — R001 Bradycardia, unspecified: Secondary | ICD-10-CM | POA: Diagnosis not present

## 2020-07-09 DIAGNOSIS — I739 Peripheral vascular disease, unspecified: Secondary | ICD-10-CM | POA: Diagnosis not present

## 2020-07-09 DIAGNOSIS — I442 Atrioventricular block, complete: Secondary | ICD-10-CM | POA: Diagnosis not present

## 2020-07-09 DIAGNOSIS — R6881 Early satiety: Secondary | ICD-10-CM | POA: Diagnosis not present

## 2020-07-09 DIAGNOSIS — N182 Chronic kidney disease, stage 2 (mild): Secondary | ICD-10-CM | POA: Diagnosis not present

## 2020-07-09 DIAGNOSIS — K219 Gastro-esophageal reflux disease without esophagitis: Secondary | ICD-10-CM | POA: Diagnosis not present

## 2020-07-09 DIAGNOSIS — I872 Venous insufficiency (chronic) (peripheral): Secondary | ICD-10-CM | POA: Diagnosis not present

## 2020-07-09 DIAGNOSIS — R29898 Other symptoms and signs involving the musculoskeletal system: Secondary | ICD-10-CM | POA: Diagnosis not present

## 2020-07-09 DIAGNOSIS — Z48812 Encounter for surgical aftercare following surgery on the circulatory system: Secondary | ICD-10-CM | POA: Diagnosis not present

## 2020-07-09 DIAGNOSIS — R296 Repeated falls: Secondary | ICD-10-CM | POA: Diagnosis not present

## 2020-07-15 ENCOUNTER — Telehealth: Payer: Self-pay

## 2020-07-15 DIAGNOSIS — K219 Gastro-esophageal reflux disease without esophagitis: Secondary | ICD-10-CM | POA: Diagnosis not present

## 2020-07-15 DIAGNOSIS — Z48812 Encounter for surgical aftercare following surgery on the circulatory system: Secondary | ICD-10-CM | POA: Diagnosis not present

## 2020-07-15 DIAGNOSIS — R29898 Other symptoms and signs involving the musculoskeletal system: Secondary | ICD-10-CM | POA: Diagnosis not present

## 2020-07-15 DIAGNOSIS — I442 Atrioventricular block, complete: Secondary | ICD-10-CM | POA: Diagnosis not present

## 2020-07-15 DIAGNOSIS — R6881 Early satiety: Secondary | ICD-10-CM | POA: Diagnosis not present

## 2020-07-15 DIAGNOSIS — R296 Repeated falls: Secondary | ICD-10-CM | POA: Diagnosis not present

## 2020-07-15 DIAGNOSIS — I129 Hypertensive chronic kidney disease with stage 1 through stage 4 chronic kidney disease, or unspecified chronic kidney disease: Secondary | ICD-10-CM | POA: Diagnosis not present

## 2020-07-15 DIAGNOSIS — R001 Bradycardia, unspecified: Secondary | ICD-10-CM | POA: Diagnosis not present

## 2020-07-15 DIAGNOSIS — N182 Chronic kidney disease, stage 2 (mild): Secondary | ICD-10-CM | POA: Diagnosis not present

## 2020-07-15 DIAGNOSIS — I739 Peripheral vascular disease, unspecified: Secondary | ICD-10-CM | POA: Diagnosis not present

## 2020-07-15 DIAGNOSIS — I872 Venous insufficiency (chronic) (peripheral): Secondary | ICD-10-CM | POA: Diagnosis not present

## 2020-07-15 NOTE — Telephone Encounter (Signed)
Patient's daughter [name] called stated that patient is in excruciating pain again in his feet and was wandering if she needs to bring him back in or can you send something in for pain.  Please advise

## 2020-07-15 NOTE — Telephone Encounter (Signed)
Tressia Danas from [place] called about mutual Pt, [name] is doing very well, want to extend his care out for 3 more weeks. Progressing well with mobility continued strength.

## 2020-07-16 MED ORDER — TRAMADOL HCL 50 MG PO TABS: 50.0000 mg | ORAL_TABLET | Freq: Three times a day (TID) | ORAL | 0 refills | Status: AC | PRN

## 2020-07-16 NOTE — Telephone Encounter (Signed)
Patient's daughter has been notified of prescription via voice mail.  I informed her that if medication does not help the pain to call office and get him back in to see Dr. Posey Pronto

## 2020-07-16 NOTE — Telephone Encounter (Signed)
Verbal Orders were given, Ok I will Thank You.

## 2020-07-16 NOTE — Telephone Encounter (Signed)
I sent tramadol to the pharmacy. If that doesn't work have them come see me.   Thanks

## 2020-07-16 NOTE — Telephone Encounter (Signed)
I am fine with that.  Please give verbal order.  In the future, I will always agree to continued physical therapy if requested by the physical therapist.  You can go ahead and give that order in the future without running it by me

## 2020-07-17 DIAGNOSIS — R6881 Early satiety: Secondary | ICD-10-CM | POA: Diagnosis not present

## 2020-07-17 DIAGNOSIS — R296 Repeated falls: Secondary | ICD-10-CM | POA: Diagnosis not present

## 2020-07-17 DIAGNOSIS — K219 Gastro-esophageal reflux disease without esophagitis: Secondary | ICD-10-CM | POA: Diagnosis not present

## 2020-07-17 DIAGNOSIS — Z48812 Encounter for surgical aftercare following surgery on the circulatory system: Secondary | ICD-10-CM | POA: Diagnosis not present

## 2020-07-17 DIAGNOSIS — I739 Peripheral vascular disease, unspecified: Secondary | ICD-10-CM | POA: Diagnosis not present

## 2020-07-17 DIAGNOSIS — I872 Venous insufficiency (chronic) (peripheral): Secondary | ICD-10-CM | POA: Diagnosis not present

## 2020-07-17 DIAGNOSIS — R29898 Other symptoms and signs involving the musculoskeletal system: Secondary | ICD-10-CM | POA: Diagnosis not present

## 2020-07-17 DIAGNOSIS — I442 Atrioventricular block, complete: Secondary | ICD-10-CM | POA: Diagnosis not present

## 2020-07-17 DIAGNOSIS — R001 Bradycardia, unspecified: Secondary | ICD-10-CM | POA: Diagnosis not present

## 2020-07-17 DIAGNOSIS — N182 Chronic kidney disease, stage 2 (mild): Secondary | ICD-10-CM | POA: Diagnosis not present

## 2020-07-17 DIAGNOSIS — I129 Hypertensive chronic kidney disease with stage 1 through stage 4 chronic kidney disease, or unspecified chronic kidney disease: Secondary | ICD-10-CM | POA: Diagnosis not present

## 2020-07-21 DIAGNOSIS — I129 Hypertensive chronic kidney disease with stage 1 through stage 4 chronic kidney disease, or unspecified chronic kidney disease: Secondary | ICD-10-CM | POA: Diagnosis not present

## 2020-07-21 DIAGNOSIS — N182 Chronic kidney disease, stage 2 (mild): Secondary | ICD-10-CM | POA: Diagnosis not present

## 2020-07-21 DIAGNOSIS — R29898 Other symptoms and signs involving the musculoskeletal system: Secondary | ICD-10-CM | POA: Diagnosis not present

## 2020-07-21 DIAGNOSIS — I872 Venous insufficiency (chronic) (peripheral): Secondary | ICD-10-CM | POA: Diagnosis not present

## 2020-07-21 DIAGNOSIS — R6881 Early satiety: Secondary | ICD-10-CM | POA: Diagnosis not present

## 2020-07-21 DIAGNOSIS — K219 Gastro-esophageal reflux disease without esophagitis: Secondary | ICD-10-CM | POA: Diagnosis not present

## 2020-07-21 DIAGNOSIS — Z48812 Encounter for surgical aftercare following surgery on the circulatory system: Secondary | ICD-10-CM | POA: Diagnosis not present

## 2020-07-21 DIAGNOSIS — R296 Repeated falls: Secondary | ICD-10-CM | POA: Diagnosis not present

## 2020-07-21 DIAGNOSIS — I739 Peripheral vascular disease, unspecified: Secondary | ICD-10-CM | POA: Diagnosis not present

## 2020-07-21 DIAGNOSIS — R001 Bradycardia, unspecified: Secondary | ICD-10-CM | POA: Diagnosis not present

## 2020-07-21 DIAGNOSIS — I442 Atrioventricular block, complete: Secondary | ICD-10-CM | POA: Diagnosis not present

## 2020-07-23 DIAGNOSIS — I739 Peripheral vascular disease, unspecified: Secondary | ICD-10-CM | POA: Diagnosis not present

## 2020-07-23 DIAGNOSIS — I129 Hypertensive chronic kidney disease with stage 1 through stage 4 chronic kidney disease, or unspecified chronic kidney disease: Secondary | ICD-10-CM | POA: Diagnosis not present

## 2020-07-23 DIAGNOSIS — R001 Bradycardia, unspecified: Secondary | ICD-10-CM | POA: Diagnosis not present

## 2020-07-23 DIAGNOSIS — N182 Chronic kidney disease, stage 2 (mild): Secondary | ICD-10-CM | POA: Diagnosis not present

## 2020-07-23 DIAGNOSIS — R296 Repeated falls: Secondary | ICD-10-CM | POA: Diagnosis not present

## 2020-07-23 DIAGNOSIS — Z48812 Encounter for surgical aftercare following surgery on the circulatory system: Secondary | ICD-10-CM | POA: Diagnosis not present

## 2020-07-23 DIAGNOSIS — I442 Atrioventricular block, complete: Secondary | ICD-10-CM | POA: Diagnosis not present

## 2020-07-23 DIAGNOSIS — I872 Venous insufficiency (chronic) (peripheral): Secondary | ICD-10-CM | POA: Diagnosis not present

## 2020-07-23 DIAGNOSIS — R29898 Other symptoms and signs involving the musculoskeletal system: Secondary | ICD-10-CM | POA: Diagnosis not present

## 2020-07-23 DIAGNOSIS — K219 Gastro-esophageal reflux disease without esophagitis: Secondary | ICD-10-CM | POA: Diagnosis not present

## 2020-07-23 DIAGNOSIS — R6881 Early satiety: Secondary | ICD-10-CM | POA: Diagnosis not present

## 2020-07-27 ENCOUNTER — Telehealth: Payer: Self-pay | Admitting: Podiatry

## 2020-07-27 NOTE — Telephone Encounter (Signed)
I will have him hold off on coming to see me unless if his pain is getting a lot worse.  If there is any issues he can definitely come back and see me make an appointment.

## 2020-07-27 NOTE — Telephone Encounter (Signed)
Pain shooting down in great toe, previous injection last week. Wants to know if he should come in to see Dr. Posey Pronto since it seems like the injection did work. Please call patient

## 2020-07-28 ENCOUNTER — Other Ambulatory Visit: Payer: Self-pay

## 2020-07-28 ENCOUNTER — Ambulatory Visit (INDEPENDENT_AMBULATORY_CARE_PROVIDER_SITE_OTHER): Payer: Medicare Other | Admitting: Podiatry

## 2020-07-28 DIAGNOSIS — M7752 Other enthesopathy of left foot: Secondary | ICD-10-CM

## 2020-07-28 DIAGNOSIS — M7751 Other enthesopathy of right foot: Secondary | ICD-10-CM

## 2020-07-29 ENCOUNTER — Encounter: Payer: Self-pay | Admitting: Podiatry

## 2020-07-29 MED ORDER — NONFORMULARY OR COMPOUNDED ITEM: 2 refills | Status: DC

## 2020-07-29 NOTE — Addendum Note (Signed)
Addended by: Graceann Congress D on: 07/29/2020 05:04 PM   Modules accepted: Orders

## 2020-07-29 NOTE — Progress Notes (Signed)
Subjective:  Patient ID: [name], male    DOB: 03/13/1919,  MRN: [id number]  Chief Complaint  Patient presents with  . Foot Pain    86 y.o. male presents with the above complaint.  Patient presents with a follow-up of right first MPJ capsulitis as well as left fourth MTPJ capsulitis.  Patient states the injection did not help he may have lasted for maybe 2 to 3 weeks but did not last long at all.  Patient states that he would like to discuss doing another injection as well as other treatment options.  He also has secondary complaint of neuropathic pain.  Patient has not tried any kind of cream.  I believe he will benefit from neuropathic cream as it is causing him some numbness and tingling as well.  He denies any other acute complaints.   Review of Systems: Negative except as noted in the HPI. Denies N/V/F/Ch.  Past Medical History:  Diagnosis Date  . Acid reflux   . Cancer ([place]) 09/05/13   left neck-non-hodgkins lymphoma  . Hypertension    requires diuretic  . Hypothyroidism   . Malaria    while in the service  . Peripheral vascular disease ([facility])    slow wound healing on legs    Current Outpatient Medications:  .  amLODipine (NORVASC) 5 MG tablet, TAKE 1 TABLET BY MOUTH  DAILY, Disp: 90 tablet, Rfl: 3 .  aspirin EC 81 MG tablet, Take 1 tablet (81 mg total) by mouth daily., Disp: , Rfl:  .  enalapril (VASOTEC) 20 MG tablet, TAKE 1 TABLET BY MOUTH  DAILY (Patient taking differently: Take 20 mg by mouth daily. ), Disp: 90 tablet, Rfl: 3 .  escitalopram (LEXAPRO) 10 MG tablet, TAKE 1 TABLET BY MOUTH EVERY DAY, Disp: 90 tablet, Rfl: 2 .  furosemide (LASIX) 20 MG tablet, TAKE 2 TABLETS BY MOUTH  EVERY DAY (Patient taking differently: Take 40 mg by mouth daily. ), Disp: 180 tablet, Rfl: 3 .  levothyroxine (SYNTHROID) 112 MCG tablet, TAKE 1 TABLET BY MOUTH  DAILY, Disp: 90 tablet, Rfl: 3 .  LORazepam (ATIVAN) 0.5 MG tablet, Take 1 tablet (0.5 mg total) by mouth every 8 (eight) hours  as needed for anxiety., Disp: 60 tablet, Rfl: 1 .  vitamin B-12 (CYANOCOBALAMIN) 1000 MCG tablet, Take 1 tablet (1,000 mcg total) by mouth daily., Disp: 30 tablet, Rfl: 5  Social History   Tobacco Use  Smoking Status Former Smoker  . Types: Cigars  Smokeless Tobacco Former Systems developer  . Quit date: 10/05/1979    Allergies  Allergen Reactions  . Codeine Nausea And Vomiting  . Percocet [Oxycodone-Acetaminophen] Nausea And Vomiting   Objective:  There were no vitals filed for this visit. There is no height or weight on file to calculate BMI. Constitutional Well developed. Well nourished.  Vascular Dorsalis pedis pulses palpable bilaterally. Posterior tibial pulses palpable bilaterally. Capillary refill normal to all digits.  No cyanosis or clubbing noted. Pedal hair growth normal.  Neurologic Normal speech. Oriented to person, place, and time. Epicritic sensation to light touch grossly present bilaterally.  Dermatologic Nails well groomed and normal in appearance. No open wounds. No skin lesions.  Orthopedic:  Pain on palpation to the first interspace at the first MTPJ joint.  Mild pain with range of motion of the MTPJ joint.  No intra-articular pain noted.  No extensor tendinitis or flexor tendinitis noted.  Negative Mulder's click or neuroma symptoms  Left fourth MTPJ joint capsulitis with range of  motion of fourth MTPJ joint.  No intra-articular pain noted.  Negative Mulder's click or neuroma symptoms.   Radiographs: None Assessment:   1. Capsulitis of metatarsophalangeal (MTP) joint of right foot   2. Capsulitis of metatarsophalangeal (MTP) joint of left foot    Plan:  Patient was evaluated and treated and all questions answered.  Right first MTPJ capsulitis and left fourth MTP capsulitis with underlying fat pad atrophy~second Patient the etiology of capsulitis and various treatment options were discussed.  Given his age he is likely putting excessive stress on the joint  leading to inflammation and therefore pain associated with it with fat pad atrophy making it easier to put excessive stress on the bone..  I believe patient will benefit from a steroid injection to both of those areas as he has previously received injections in those areas.  Patient agrees with the plan would like to proceed with injection. -A second steroid injection was performed at right first MTPJ joint and left fourth MTPJ joint using 1% plain Lidocaine and 10 mg of Kenalog. This was well tolerated.  Neuropathic pain -I explained patient the etiology of neuropathic pain and various treatment options were extensively discussed.  I believe patient will benefit from formulary cream from a specialized pharmacy to help address the neuropathic pain.  Patient agrees with the plan would like to obtain the cream. -A prescription was sent to specialized pharmacy for neuropathic pain.   No follow-ups on file.

## 2020-07-30 DIAGNOSIS — I442 Atrioventricular block, complete: Secondary | ICD-10-CM | POA: Diagnosis not present

## 2020-07-30 DIAGNOSIS — I872 Venous insufficiency (chronic) (peripheral): Secondary | ICD-10-CM | POA: Diagnosis not present

## 2020-07-30 DIAGNOSIS — R29898 Other symptoms and signs involving the musculoskeletal system: Secondary | ICD-10-CM | POA: Diagnosis not present

## 2020-07-30 DIAGNOSIS — R296 Repeated falls: Secondary | ICD-10-CM | POA: Diagnosis not present

## 2020-07-30 DIAGNOSIS — I129 Hypertensive chronic kidney disease with stage 1 through stage 4 chronic kidney disease, or unspecified chronic kidney disease: Secondary | ICD-10-CM | POA: Diagnosis not present

## 2020-07-30 DIAGNOSIS — Z48812 Encounter for surgical aftercare following surgery on the circulatory system: Secondary | ICD-10-CM | POA: Diagnosis not present

## 2020-07-30 DIAGNOSIS — N182 Chronic kidney disease, stage 2 (mild): Secondary | ICD-10-CM | POA: Diagnosis not present

## 2020-07-30 DIAGNOSIS — R001 Bradycardia, unspecified: Secondary | ICD-10-CM | POA: Diagnosis not present

## 2020-07-30 DIAGNOSIS — I739 Peripheral vascular disease, unspecified: Secondary | ICD-10-CM | POA: Diagnosis not present

## 2020-07-30 DIAGNOSIS — R6881 Early satiety: Secondary | ICD-10-CM | POA: Diagnosis not present

## 2020-07-30 DIAGNOSIS — K219 Gastro-esophageal reflux disease without esophagitis: Secondary | ICD-10-CM | POA: Diagnosis not present

## 2020-07-31 DIAGNOSIS — R296 Repeated falls: Secondary | ICD-10-CM | POA: Diagnosis not present

## 2020-07-31 DIAGNOSIS — I739 Peripheral vascular disease, unspecified: Secondary | ICD-10-CM | POA: Diagnosis not present

## 2020-07-31 DIAGNOSIS — I442 Atrioventricular block, complete: Secondary | ICD-10-CM | POA: Diagnosis not present

## 2020-07-31 DIAGNOSIS — I129 Hypertensive chronic kidney disease with stage 1 through stage 4 chronic kidney disease, or unspecified chronic kidney disease: Secondary | ICD-10-CM | POA: Diagnosis not present

## 2020-07-31 DIAGNOSIS — N182 Chronic kidney disease, stage 2 (mild): Secondary | ICD-10-CM | POA: Diagnosis not present

## 2020-07-31 DIAGNOSIS — Z48812 Encounter for surgical aftercare following surgery on the circulatory system: Secondary | ICD-10-CM | POA: Diagnosis not present

## 2020-07-31 DIAGNOSIS — R29898 Other symptoms and signs involving the musculoskeletal system: Secondary | ICD-10-CM | POA: Diagnosis not present

## 2020-07-31 DIAGNOSIS — R6881 Early satiety: Secondary | ICD-10-CM | POA: Diagnosis not present

## 2020-07-31 DIAGNOSIS — R001 Bradycardia, unspecified: Secondary | ICD-10-CM | POA: Diagnosis not present

## 2020-07-31 DIAGNOSIS — K219 Gastro-esophageal reflux disease without esophagitis: Secondary | ICD-10-CM | POA: Diagnosis not present

## 2020-07-31 DIAGNOSIS — I872 Venous insufficiency (chronic) (peripheral): Secondary | ICD-10-CM | POA: Diagnosis not present

## 2020-08-17 ENCOUNTER — Other Ambulatory Visit: Payer: Self-pay

## 2020-08-17 ENCOUNTER — Encounter: Payer: Self-pay | Admitting: Podiatry

## 2020-08-17 ENCOUNTER — Ambulatory Visit: Payer: Medicare Other | Admitting: Podiatry

## 2020-08-17 DIAGNOSIS — M7751 Other enthesopathy of right foot: Secondary | ICD-10-CM

## 2020-08-17 DIAGNOSIS — M7752 Other enthesopathy of left foot: Secondary | ICD-10-CM | POA: Diagnosis not present

## 2020-08-18 ENCOUNTER — Encounter: Payer: Self-pay | Admitting: Podiatry

## 2020-08-18 NOTE — Progress Notes (Signed)
Subjective:  Patient ID: [name], male    DOB: [birth year],  MRN: [id number]  Chief Complaint  Patient presents with  . Nail Problem    86 y.o. male presents with the above complaint.  Patient presents with a follow-up of bilateral MPJ capsulitis.  Patient states is doing well.  He does not have any pain there.  He denies any other acute complaints.   Review of Systems: Negative except as noted in the HPI. Denies N/V/F/Ch.  Past Medical History:  Diagnosis Date  . Acid reflux   . Cancer ([place]) 09/05/13   left neck-non-hodgkins lymphoma  . Hypertension    requires diuretic  . Hypothyroidism   . Malaria    while in the service  . Peripheral vascular disease (HCC)    slow wound healing on legs    Current Outpatient Medications:  .  amLODipine (NORVASC) 5 MG tablet, TAKE 1 TABLET BY MOUTH  DAILY, Disp: 90 tablet, Rfl: 3 .  aspirin EC 81 MG tablet, Take 1 tablet (81 mg total) by mouth daily., Disp: , Rfl:  .  enalapril (VASOTEC) 20 MG tablet, TAKE 1 TABLET BY MOUTH  DAILY (Patient taking differently: Take 20 mg by mouth daily. ), Disp: 90 tablet, Rfl: 3 .  escitalopram (LEXAPRO) 10 MG tablet, TAKE 1 TABLET BY MOUTH EVERY DAY, Disp: 90 tablet, Rfl: 2 .  furosemide (LASIX) 20 MG tablet, TAKE 2 TABLETS BY MOUTH  EVERY DAY (Patient taking differently: Take 40 mg by mouth daily. ), Disp: 180 tablet, Rfl: 3 .  levothyroxine (SYNTHROID) 112 MCG tablet, TAKE 1 TABLET BY MOUTH  DAILY, Disp: 90 tablet, Rfl: 3 .  LORazepam (ATIVAN) 0.5 MG tablet, Take 1 tablet (0.5 mg total) by mouth every 8 (eight) hours as needed for anxiety., Disp: 60 tablet, Rfl: 1 .  NONFORMULARY OR COMPOUNDED ITEM, See pharmacy note, Disp: 120 each, Rfl: 2 .  vitamin B-12 (CYANOCOBALAMIN) 1000 MCG tablet, Take 1 tablet (1,000 mcg total) by mouth daily., Disp: 30 tablet, Rfl: 5  Social History   Tobacco Use  Smoking Status Former Smoker  . Types: Cigars  Smokeless Tobacco Former Systems developer  . Quit date: 10/05/1979     Allergies  Allergen Reactions  . Codeine Nausea And Vomiting  . Percocet [Oxycodone-Acetaminophen] Nausea And Vomiting   Objective:  There were no vitals filed for this visit. There is no height or weight on file to calculate BMI. Constitutional Well developed. Well nourished.  Vascular Dorsalis pedis pulses palpable bilaterally. Posterior tibial pulses palpable bilaterally. Capillary refill normal to all digits.  No cyanosis or clubbing noted. Pedal hair growth normal.  Neurologic Normal speech. Oriented to person, place, and time. Epicritic sensation to light touch grossly present bilaterally.  Dermatologic Nails well groomed and normal in appearance. No open wounds. No skin lesions.  Orthopedic:  No pain on palpation to the first interspace at the first MTPJ joint.  Mild pain with range of motion of the MTPJ joint.  No intra-articular pain noted.  No extensor tendinitis or flexor tendinitis noted.  Negative Mulder's click or neuroma symptoms  No pain left fourth MTPJ joint capsulitis with range of motion of fourth MTPJ joint.  No intra-articular pain noted.  Negative Mulder's click or neuroma symptoms.   Radiographs: None Assessment:   1. Capsulitis of metatarsophalangeal (MTP) joint of right foot   2. Capsulitis of metatarsophalangeal (MTP) joint of left foot    Plan:  Patient was evaluated and treated and all questions answered.  Right first MTPJ capsulitis and left fourth MTP capsulitis with underlying fat pad atrophy~second -Resolved with steroid injections x2.  No further acute complaints.  Overall I discussed with the patient the importance of wearing optimal shoe gear to prevent recurrence of the pain.  Patient states understanding  Neuropathic pain -I explained patient the etiology of neuropathic pain and various treatment options were extensively discussed.  I believe patient will benefit from formulary cream from a specialized pharmacy to help address the  neuropathic pain.  Patient agrees with the plan would like to obtain the cream. -Continue applying specialty pharmacy cream   No follow-ups on file.

## 2020-08-25 ENCOUNTER — Ambulatory Visit: Payer: Medicare Other | Admitting: Podiatry

## 2020-08-28 ENCOUNTER — Ambulatory Visit (INDEPENDENT_AMBULATORY_CARE_PROVIDER_SITE_OTHER): Payer: Medicare Other

## 2020-08-28 DIAGNOSIS — I442 Atrioventricular block, complete: Secondary | ICD-10-CM | POA: Diagnosis not present

## 2020-08-30 LAB — CUP PACEART REMOTE DEVICE CHECK
Battery Remaining Longevity: 80 mo
Battery Remaining Percentage: 95.5 %
Battery Voltage: 2.99 V
Brady Statistic AP VP Percent: 7.5 %
Brady Statistic AP VS Percent: 1 %
Brady Statistic AS VP Percent: 90 %
Brady Statistic AS VS Percent: 1.3 %
Brady Statistic RA Percent Paced: 6.5 %
Brady Statistic RV Percent Paced: 98 %
Date Time Interrogation Session: 20211001020012
Implantable Lead Implant Date: 20210701
Implantable Lead Implant Date: 20210701
Implantable Lead Location: 753859
Implantable Lead Location: 753860
Implantable Pulse Generator Implant Date: 20210701
Lead Channel Impedance Value: 400 Ohm
Lead Channel Impedance Value: 510 Ohm
Lead Channel Pacing Threshold Amplitude: 0.75 V
Lead Channel Pacing Threshold Amplitude: 2 V
Lead Channel Pacing Threshold Pulse Width: 0.5 ms
Lead Channel Pacing Threshold Pulse Width: 0.5 ms
Lead Channel Sensing Intrinsic Amplitude: 1.6 mV
Lead Channel Sensing Intrinsic Amplitude: 12 mV
Lead Channel Setting Pacing Amplitude: 2.25 V
Lead Channel Setting Pacing Amplitude: 3.5 V
Lead Channel Setting Pacing Pulse Width: 0.5 ms
Lead Channel Setting Sensing Sensitivity: 2 mV
Pulse Gen Model: 2272
Pulse Gen Serial Number: 3842377

## 2020-08-31 NOTE — Progress Notes (Signed)
Remote pacemaker transmission.   

## 2020-09-07 ENCOUNTER — Ambulatory Visit: Payer: Medicare Other | Admitting: Internal Medicine

## 2020-09-07 ENCOUNTER — Encounter: Payer: Self-pay | Admitting: Family Medicine

## 2020-09-07 ENCOUNTER — Other Ambulatory Visit: Payer: Self-pay

## 2020-09-07 ENCOUNTER — Encounter: Payer: Self-pay | Admitting: Internal Medicine

## 2020-09-07 ENCOUNTER — Ambulatory Visit (INDEPENDENT_AMBULATORY_CARE_PROVIDER_SITE_OTHER): Payer: Medicare Other | Admitting: Family Medicine

## 2020-09-07 VITALS — BP 128/66 | HR 78 | Temp 98.1°F | Resp 16

## 2020-09-07 VITALS — BP 132/76 | HR 77 | Ht 73.0 in | Wt 189.0 lb

## 2020-09-07 DIAGNOSIS — I10 Essential (primary) hypertension: Secondary | ICD-10-CM | POA: Diagnosis not present

## 2020-09-07 DIAGNOSIS — I442 Atrioventricular block, complete: Secondary | ICD-10-CM | POA: Diagnosis not present

## 2020-09-07 DIAGNOSIS — L309 Dermatitis, unspecified: Secondary | ICD-10-CM | POA: Diagnosis not present

## 2020-09-07 DIAGNOSIS — Z23 Encounter for immunization: Secondary | ICD-10-CM

## 2020-09-07 DIAGNOSIS — H60392 Other infective otitis externa, left ear: Secondary | ICD-10-CM

## 2020-09-07 LAB — CUP PACEART INCLINIC DEVICE CHECK
Battery Remaining Longevity: 92 mo
Battery Voltage: 2.99 V
Brady Statistic RA Percent Paced: 6.5 %
Brady Statistic RV Percent Paced: 98 %
Date Time Interrogation Session: 20211011172857
Implantable Lead Implant Date: 20210701
Implantable Lead Implant Date: 20210701
Implantable Lead Location: 753859
Implantable Lead Location: 753860
Implantable Pulse Generator Implant Date: 20210701
Lead Channel Impedance Value: 412.5 Ohm
Lead Channel Impedance Value: 550 Ohm
Lead Channel Pacing Threshold Amplitude: 1 V
Lead Channel Pacing Threshold Amplitude: 1.25 V
Lead Channel Pacing Threshold Pulse Width: 0.5 ms
Lead Channel Pacing Threshold Pulse Width: 0.5 ms
Lead Channel Sensing Intrinsic Amplitude: 10 mV
Lead Channel Sensing Intrinsic Amplitude: 2.1 mV
Lead Channel Setting Pacing Amplitude: 1.5 V
Lead Channel Setting Pacing Amplitude: 2 V
Lead Channel Setting Pacing Pulse Width: 0.5 ms
Lead Channel Setting Sensing Sensitivity: 2 mV
Pulse Gen Model: 2272
Pulse Gen Serial Number: 3842377

## 2020-09-07 MED ORDER — NEOMYCIN-POLYMYXIN-HC 3.5-10000-1 OT SOLN
3.0000 [drp] | Freq: Four times a day (QID) | OTIC | 0 refills | Status: DC
Start: 2020-09-07 — End: 2021-06-25

## 2020-09-07 MED ORDER — CLOTRIMAZOLE 1 % EX CREA: 1.0000 "application " | TOPICAL_CREAM | Freq: Two times a day (BID) | CUTANEOUS | 0 refills | Status: DC

## 2020-09-07 NOTE — Patient Instructions (Signed)
Apply cream to outside of ear and behind ear twice a day Use drops for 5 days  Flu shot given  F/U as needed

## 2020-09-07 NOTE — Patient Instructions (Signed)
Medication Instructions:  Your physician recommends that you continue on your current medications as directed. Please refer to the Current Medication list given to you today.  *If you need a refill on your cardiac medications before your next appointment, please call your pharmacy*  Lab Work: None ordered.  If you have labs (blood work) drawn today and your tests are completely normal, you will receive your results only by: Marland Kitchen MyChart Message (if you have MyChart) OR . A paper copy in the mail If you have any lab test that is abnormal or we need to change your treatment, we will call you to review the results.  Testing/Procedures: None ordered.  Follow-Up: At [facility], you and your health needs are our priority.  As part of our continuing mission to provide you with exceptional heart care, we have created designated Provider Care Teams.  These Care Teams include your primary Cardiologist (physician) and Advanced Practice Providers (APPs -  Physician Assistants and Nurse Practitioners) who all work together to provide you with the care you need, when you need it.  We recommend signing up for the patient portal called "MyChart".  Sign up information is provided on this After Visit Summary.  MyChart is used to connect with patients for Virtual Visits (Telemedicine).  Patients are able to view lab/test results, encounter notes, upcoming appointments, etc.  Non-urgent messages can be sent to your provider as well.   To learn more about what you can do with MyChart, go to [email or web address].    Your next appointment:   Your physician wants you to follow-up in: 12 months with Tommye Standard. You will receive a reminder letter in the mail two months in advance. If you don't receive a letter, please call our office to schedule the follow-up appointment.  Remote monitoring is used to monitor your Pacemaker  from home. This monitoring reduces the number of office visits required to check your  device to one time per year. It allows [place] to keep an eye on the functioning of your device to ensure it is working properly. You are scheduled for a device check from home on 11/30/20 . You may send your transmission at any time that day. If you have a wireless device, the transmission will be sent automatically. After your physician reviews your transmission, you will receive a postcard with your next transmission date.  Other Instructions:

## 2020-09-07 NOTE — Progress Notes (Signed)
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  Since his PPM implant, the patient reports doing very well.  No further falls or syncope.  Today, he denies symptoms of palpitations, chest pain, shortness of breath,  Or dizziness.  The patient is otherwise without complaint today.   Past Medical History:  Diagnosis Date  . Acid reflux   . Cancer ([place]) 09/05/13   left neck-non-hodgkins lymphoma  . Hypertension    requires diuretic  . Hypothyroidism   . Malaria    while in the service  . Peripheral vascular disease ([place])    slow wound healing on legs   Past Surgical History:  Procedure Laterality Date  . AMPUTATION Left 06/25/2015   Procedure: LEFT  HALLUX AMPUTATION,;  Surgeon: Wylene Simmer, MD;  Location: [place];  Service: Orthopedics;  Laterality: Left;  LOCAL/MAC  . CHOLECYSTECTOMY N/A 11/08/2018   Procedure: LAPAROSCOPIC CHOLECYSTECTOMY WITH INTRAOPERATIVE CHOLANGIOGRAM;  Surgeon: [name], MD;  Location: [facility];  Service: General;  Laterality: N/A;  . MASS BIOPSY Left 09/05/2013   Procedure: EXCISIONAL BIOPSY OF LEFT NECK MASS;  Surgeon: Jerrell Belfast, MD;  Location: [place];  Service: ENT;  Laterality: Left;  . PACEMAKER IMPLANT N/A 05/28/2020   Procedure: PACEMAKER IMPLANT;  Surgeon: Thompson Grayer, MD;  Location: [place] [facility];  Service: Cardiovascular;  Laterality: N/A;  . TONSILLECTOMY    . YAG LASER APPLICATION Right 03/22/9562   Procedure: YAG LASER APPLICATION;  Surgeon: [name], MD;  Location: [facility];  Service: Ophthalmology;  Laterality: Right;    ROS- all systems are reviewed and negative except as per HPI above  Current Outpatient Medications  Medication Sig Dispense Refill  . amLODipine (NORVASC) 5 MG tablet TAKE 1 TABLET BY MOUTH  DAILY 90 tablet 3  . aspirin EC 81 MG tablet Take 1 tablet (81 mg total) by mouth daily.    . clotrimazole  (LOTRIMIN) 1 % cream Apply 1 application topically 2 (two) times daily. Apply to external ear for 1 week 30 g 0  . enalapril (VASOTEC) 20 MG tablet TAKE 1 TABLET BY MOUTH  DAILY 90 tablet 3  . escitalopram (LEXAPRO) 10 MG tablet TAKE 1 TABLET BY MOUTH EVERY DAY 90 tablet 2  . furosemide (LASIX) 20 MG tablet TAKE 2 TABLETS BY MOUTH  EVERY DAY 180 tablet 3  . levothyroxine (SYNTHROID) 112 MCG tablet TAKE 1 TABLET BY MOUTH  DAILY 90 tablet 3  . LORazepam (ATIVAN) 0.5 MG tablet Take 1 tablet (0.5 mg total) by mouth every 8 (eight) hours as needed for anxiety. 60 tablet 1  . neomycin-polymyxin-hydrocortisone (CORTISPORIN) OTIC solution Place 3 drops into the left ear 4 (four) times daily. For 5 days 10 mL 0  . NONFORMULARY OR COMPOUNDED ITEM See pharmacy note 120 each 2  . vitamin B-12 (CYANOCOBALAMIN) 1000 MCG tablet Take 1 tablet (1,000 mcg total) by mouth daily. 30 tablet 5   No current facility-administered medications for this visit.    Physical Exam: Vitals:   09/07/20 1452  BP: 132/76  Pulse: 77  SpO2: (!) 81%  Weight: 189 lb (85.7 kg)  Height: _0  (1.854 m)    GEN- The patient is elderly appearing, alert and oriented x 3 today.  Head- normocephalic, atraumatic Eyes-  L eye surgical pupil Ears- hearing reduced Oropharynx- clear Lungs-   normal work of  breathing Chest- pacemaker pocket is well healed Heart- Regular rate and rhythm  GI- soft  Extremities- no clubbing, cyanosis, + edema  Pacemaker interrogation- reviewed in detail today,  See PACEART report  ekg tracing ordered today is personally reviewed and shows sinus with V pacing  Assessment and Plan:  1. Symptomatic transient complete heart block Normal pacemaker function See Pace Art report No changes today he is not device dependant today  2. HTN Stable No change required today  Return to see EP PA every year I will follow remotely and see when needed in the office  Risks, benefits and potential  toxicities for medications prescribed and/or refilled reviewed with patient today.   Thompson Grayer MD, [facility] 09/07/2020 3:03 PM

## 2020-09-07 NOTE — Progress Notes (Signed)
   Subjective:    Patient ID: [name], male    DOB: 05/18/1919, 86 y.o.   MRN: [id number]  Patient presents for L Ear Pain (x1 day- pain in ear and back of head)  Patient here with left ear pain started yesterday evening.  It is a sharp pain shooting into his ear.  The pain kept him up all night.  He has soreness and pain even if he does manipulate his ear.  His daughter also states that he has had lots of skin cancers around his ear.  They will often see blood on his pillow.  He is moving his head.  They have never seen any active bleeding from any of the spots.  He does have redness behind the ears as well which he states causes pain.  He has not had any sinus pressure drainage no cough or congestion no fever  Review Of Systems:  GEN- denies fatigue, fever, weight loss,weakness, recent illness HEENT- denies eye drainage, change in vision, nasal discharge, CVS- denies chest pain, palpitations RESP- denies SOB, cough, wheeze ABD- denies N/V, change in stools, abd pain MSK- denies joint pain, muscle aches, injury Neuro- denies headache, dizziness, syncope, seizure activity       Objective:    BP 128/66   Pulse 78   Temp 98.1 F (36.7 C) (Temporal)   Resp 16   SpO2 98%  GEN- NAD, alert and oriented x3,sitting in wheelchair  HEENT- PERRL, EOMI, non injected sclera, pink conjunctiva, MMM, oropharynx clear, no sinus tenderness, Right TM clear, no effusion, left TM in tact, mild wax, erythema and swelling entrance of canal - TTP at that region, pain with manipulation of the pinna, erythema with a discharge behind the ear, no active bleeding,multiple skin spots  Neck- Supple, noLAD  CVS- RRR, no murmur RESP-CTAB EXT- No edema Pulses- Radial 2+        Assessment & Plan:      Problem List Items Addressed This Visit    None    Visit Diagnoses    Other infective acute otitis externa of left ear    -  Primary   Treat with cortisporin drops,Tyelnol   Relevant Medications    clotrimazole (LOTRIMIN) 1 % cream   Dermatitis       Dermatitis Noted behind ear, he likely has a fungal component and this is cracking the skin, given clotrimazole topical       Note: This dictation was prepared with Dragon dictation along with smaller phrase technology. Any transcriptional errors that result from this process are unintentional.

## 2020-10-07 ENCOUNTER — Other Ambulatory Visit: Payer: Self-pay | Admitting: Family Medicine

## 2020-10-09 ENCOUNTER — Other Ambulatory Visit: Payer: Self-pay | Admitting: Family Medicine

## 2020-10-12 NOTE — Telephone Encounter (Signed)
Please Advise

## 2020-11-17 ENCOUNTER — Ambulatory Visit: Payer: Medicare Other | Admitting: Podiatry

## 2020-11-19 ENCOUNTER — Ambulatory Visit: Payer: Medicare Other | Admitting: Podiatry

## 2020-11-19 ENCOUNTER — Other Ambulatory Visit: Payer: Self-pay

## 2020-11-19 ENCOUNTER — Encounter: Payer: Self-pay | Admitting: Podiatry

## 2020-11-19 DIAGNOSIS — I739 Peripheral vascular disease, unspecified: Secondary | ICD-10-CM

## 2020-11-19 DIAGNOSIS — L89629 Pressure ulcer of left heel, unspecified stage: Secondary | ICD-10-CM

## 2020-11-23 ENCOUNTER — Encounter: Payer: Self-pay | Admitting: Podiatry

## 2020-11-23 NOTE — Progress Notes (Signed)
Subjective:  Patient ID: [name], male    DOB: [birth year],  MRN: [id number]  Chief Complaint  Patient presents with  . Nail Problem    Nail trim RFC    86 y.o. male presents with the above complaint.  Patient presents with complaint of mild pressure sore to the left heel.  Patient states it painful to walk on.  Patient has a history of peripheral vascular disease but diabetes.  They have not tried offloading it.  Patient does lay on the bed without any offloading.  He denies any other acute complaints.  He would like to discuss treatment options.   Review of Systems: Negative except as noted in the HPI. Denies N/V/F/Ch.  Past Medical History:  Diagnosis Date  . Acid reflux   . Cancer ([place]) 09/05/13   left neck-non-hodgkins lymphoma  . Hypertension    requires diuretic  . Hypothyroidism   . Malaria    while in the service  . Peripheral vascular disease (HCC)    slow wound healing on legs    Current Outpatient Medications:  .  amLODipine (NORVASC) 5 MG tablet, TAKE 1 TABLET BY MOUTH  DAILY, Disp: 90 tablet, Rfl: 3 .  aspirin EC 81 MG tablet, Take 1 tablet (81 mg total) by mouth daily., Disp: , Rfl:  .  clotrimazole (LOTRIMIN) 1 % cream, Apply 1 application topically 2 (two) times daily. Apply to external ear for 1 week, Disp: 30 g, Rfl: 0 .  enalapril (VASOTEC) 20 MG tablet, TAKE 1 TABLET BY MOUTH  DAILY, Disp: 90 tablet, Rfl: 3 .  escitalopram (LEXAPRO) 10 MG tablet, TAKE 1 TABLET BY MOUTH EVERY DAY, Disp: 90 tablet, Rfl: 2 .  furosemide (LASIX) 20 MG tablet, TAKE 2 TABLETS BY MOUTH  DAILY, Disp: 180 tablet, Rfl: 3 .  levothyroxine (SYNTHROID) 112 MCG tablet, TAKE 1 TABLET BY MOUTH  DAILY, Disp: 90 tablet, Rfl: 3 .  LORazepam (ATIVAN) 0.5 MG tablet, TAKE 1 TABLET BY MOUTH EVERY 8 HOURS AS NEEDED FOR ANXIETY., Disp: 60 tablet, Rfl: 0 .  neomycin-polymyxin-hydrocortisone (CORTISPORIN) OTIC solution, Place 3 drops into the left ear 4 (four) times daily. For 5 days, Disp: 10 mL,  Rfl: 0 .  NONFORMULARY OR COMPOUNDED ITEM, See pharmacy note, Disp: 120 each, Rfl: 2 .  vitamin B-12 (CYANOCOBALAMIN) 1000 MCG tablet, Take 1 tablet (1,000 mcg total) by mouth daily., Disp: 30 tablet, Rfl: 5  Social History   Tobacco Use  Smoking Status Former Smoker  . Types: Cigars  Smokeless Tobacco Former Systems developer  . Quit date: 10/05/1979    Allergies  Allergen Reactions  . Codeine Nausea And Vomiting  . Percocet [Oxycodone-Acetaminophen] Nausea And Vomiting   Objective:  There were no vitals filed for this visit. There is no height or weight on file to calculate BMI. Constitutional Well developed. Well nourished.  Vascular Dorsalis pedis pulses palpable bilaterally. Posterior tibial pulses palpable bilaterally. Capillary refill normal to all digits.  No cyanosis or clubbing noted. Pedal hair growth normal.  Neurologic Normal speech. Oriented to person, place, and time. Epicritic sensation to light touch grossly present bilaterally.  Dermatologic  mild pressure injury to the left heel without any concern for deep bone formation.  No ulceration noted.  No pressure sore noted.  Orthopedic: Normal joint ROM without pain or crepitus bilaterally. No visible deformities. No bony tenderness.   Radiographs: None Assessment:   1. Pressure injury of skin of left heel, unspecified injury stage   2. Peripheral vascular  disease [facility])    Plan:  Patient was evaluated and treated and all questions answered.  Left heel superficial pressure injury with involvement of the epidermal layer. -I explained to the patient the etiology of pressure injury and various treatment options were discussed.  Given that patient has peripheral vascular disease he has a hard time healing any type of pressure sore.  He does not have a pressure sore yet however I discussed aggressive offloading with pillow behind the calf region to allow the heel to not have pressure on it.  Patient agrees with the plan and  will proceed to do that.   No follow-ups on file.

## 2020-11-30 ENCOUNTER — Ambulatory Visit (INDEPENDENT_AMBULATORY_CARE_PROVIDER_SITE_OTHER): Payer: Medicare Other

## 2020-11-30 DIAGNOSIS — I459 Conduction disorder, unspecified: Secondary | ICD-10-CM

## 2020-12-01 ENCOUNTER — Telehealth: Payer: Self-pay | Admitting: Emergency Medicine

## 2020-12-01 LAB — CUP PACEART REMOTE DEVICE CHECK
Battery Remaining Longevity: 70 mo
Battery Remaining Percentage: 95.5 %
Battery Voltage: 2.99 V
Brady Statistic AP VP Percent: 17 %
Brady Statistic AP VS Percent: 1 %
Brady Statistic AS VP Percent: 77 %
Brady Statistic AS VS Percent: 2 %
Brady Statistic RA Percent Paced: 13 %
Brady Statistic RV Percent Paced: 94 %
Date Time Interrogation Session: 20220103020013
Implantable Lead Implant Date: 20210701
Implantable Lead Implant Date: 20210701
Implantable Lead Location: 753859
Implantable Lead Location: 753860
Implantable Pulse Generator Implant Date: 20210701
Lead Channel Impedance Value: 350 Ohm
Lead Channel Impedance Value: 450 Ohm
Lead Channel Pacing Threshold Amplitude: 0.875 V
Lead Channel Pacing Threshold Amplitude: 3.5 V
Lead Channel Pacing Threshold Pulse Width: 0.5 ms
Lead Channel Pacing Threshold Pulse Width: 0.5 ms
Lead Channel Sensing Intrinsic Amplitude: 1.5 mV
Lead Channel Sensing Intrinsic Amplitude: 12 mV
Lead Channel Setting Pacing Amplitude: 1.875
Lead Channel Setting Pacing Amplitude: 5 V
Lead Channel Setting Pacing Pulse Width: 0.5 ms
Lead Channel Setting Sensing Sensitivity: 2 mV
Pulse Gen Model: 2272
Pulse Gen Serial Number: 3842377

## 2020-12-01 NOTE — Telephone Encounter (Signed)
Spoke with Terry(DPR) patient's son and scheduled patient for DC appointment 12/03/20 at 4 pm. Device clinic # provided if appointment time needs to be changed.

## 2020-12-02 ENCOUNTER — Encounter: Payer: Self-pay | Admitting: Nurse Practitioner

## 2020-12-02 ENCOUNTER — Telehealth: Payer: Self-pay | Admitting: Internal Medicine

## 2020-12-02 ENCOUNTER — Other Ambulatory Visit: Payer: Self-pay

## 2020-12-02 ENCOUNTER — Ambulatory Visit (INDEPENDENT_AMBULATORY_CARE_PROVIDER_SITE_OTHER): Payer: Medicare Other | Admitting: Nurse Practitioner

## 2020-12-02 VITALS — BP 128/80 | HR 81 | Temp 97.0°F | Ht 73.0 in | Wt 180.0 lb

## 2020-12-02 DIAGNOSIS — M7989 Other specified soft tissue disorders: Secondary | ICD-10-CM

## 2020-12-02 DIAGNOSIS — R609 Edema, unspecified: Secondary | ICD-10-CM

## 2020-12-02 NOTE — Progress Notes (Signed)
Subjective:    Patient ID: [name], male    DOB: [birth year], 87 y.o.   MRN: [id number]  HPI: [name] is a 87 y.o. male presenting for leg swelling.  Chief Complaint  Patient presents with  . Leg Swelling    Past month has been bk on lasix and wearing compressions. Fluid is coming from left foot. This has happened several yrs ago where foot opened up due to swelling. Has lots of pain. Has cards appt tomorrow. Also [facility]   LEG SWELLING Recently, saw foot doctor and said his feet looked great.   Started taking the Lasix about 1 month ago when swelling was getting worse; has not noticed much of a difference and daughter reports noticing not as much urine output as normal with Lasix. Taking 1 pill, 20 mg daily.  Has been wearing compression stockings at nighttime. Duration: months Pain: yes Severity: moderate  Quality:  throbbing Location:  lower legs Bilateral:  yes Onset: sudden Frequency: constant Time of day: at random Sudden unintentional leg jerking:   no Paresthesias:   yes Decreased sensation:  no Weakness:   yes Insomnia:   no Fatigue:   no  Chest pain: no Shortness of breath: no Alleviating factors: elevation Aggravating factors: nothing Status: worse Treatments attempted: elevation  Allergies  Allergen Reactions  . Codeine Nausea And Vomiting  . Percocet [Oxycodone-Acetaminophen] Nausea And Vomiting    Outpatient Encounter Medications as of 12/02/2020  Medication Sig  . amLODipine (NORVASC) 5 MG tablet TAKE 1 TABLET BY MOUTH  DAILY  . aspirin EC 81 MG tablet Take 1 tablet (81 mg total) by mouth daily.  . clotrimazole (LOTRIMIN) 1 % cream Apply 1 application topically 2 (two) times daily. Apply to external ear for 1 week  . enalapril (VASOTEC) 20 MG tablet TAKE 1 TABLET BY MOUTH  DAILY  . escitalopram (LEXAPRO) 10 MG tablet TAKE 1 TABLET BY MOUTH EVERY DAY  . furosemide (LASIX) 20 MG tablet TAKE 2 TABLETS BY MOUTH  DAILY  .  levothyroxine (SYNTHROID) 112 MCG tablet TAKE 1 TABLET BY MOUTH  DAILY  . LORazepam (ATIVAN) 0.5 MG tablet TAKE 1 TABLET BY MOUTH EVERY 8 HOURS AS NEEDED FOR ANXIETY.  Marland Kitchen neomycin-polymyxin-hydrocortisone (CORTISPORIN) OTIC solution Place 3 drops into the left ear 4 (four) times daily. For 5 days  . NONFORMULARY OR COMPOUNDED ITEM See pharmacy note  . vitamin B-12 (CYANOCOBALAMIN) 1000 MCG tablet Take 1 tablet (1,000 mcg total) by mouth daily.  . [DISCONTINUED] famotidine (PEPCID) 20 MG tablet Take 20 mg by mouth 2 (two) times daily.   No facility-administered encounter medications on file as of 12/02/2020.    Patient Active Problem List   Diagnosis Date Noted  . Leg swelling 12/02/2020  . Heart block 05/27/2020  . Fall 05/27/2020  . Heart block AV complete ([place]) 05/27/2020  . Hypothyroidism 11/07/2018  . CKD (chronic kidney disease) stage 3, GFR 30-59 ml/min 11/07/2018  . Abnormal liver function 11/07/2018  . NHL (non-Hodgkin's lymphoma) ([place]) 11/07/2018  . Venous stasis dermatitis 05/28/2015  . Insomnia 05/28/2015  . Cancer ([place]) 09/05/2013  . Lymphadenopathy, cervical 08/29/2013    Class: Chronic  . Acid reflux   . Hypertension   . Peripheral vascular disease ([place])     Past Medical History:  Diagnosis Date  . Acid reflux   . Cancer ([place]) 09/05/13   left neck-non-hodgkins lymphoma  . Hypertension    requires diuretic  . Hypothyroidism   .  Malaria    while in the service  . Peripheral vascular disease (HCC)    slow wound healing on legs    Relevant past medical, surgical, family and social history reviewed and updated as indicated. Interim medical history since our last visit reviewed.  Review of Systems  Constitutional: Positive for unexpected weight change. Negative for activity change, appetite change, fatigue and fever.  Respiratory: Negative.  Negative for cough, chest tightness, shortness of breath and wheezing.   Cardiovascular: Positive for leg swelling. Negative  for chest pain.  Musculoskeletal: Negative.   Skin: Negative.   Neurological: Negative.   Hematological: Negative.   Psychiatric/Behavioral: Negative.     Per HPI unless specifically indicated above     Objective:    BP 128/80   Pulse 81   Temp (!) 97 F (36.1 C)   Ht 6\' 1"  (1.854 m)   Wt 180 lb (81.6 kg)   SpO2 98%   BMI 23.75 kg/m   Wt Readings from Last 3 Encounters:  12/02/20 180 lb (81.6 kg)  09/07/20 189 lb (85.7 kg)  05/28/20 198 lb 10.2 oz (90.1 kg)    Physical Exam Vitals and nursing note reviewed.  Constitutional:      General: He is not in acute distress.    Appearance: Normal appearance. He is not toxic-appearing.  Cardiovascular:     Rate and Rhythm: Normal rate and regular rhythm.     Heart sounds: Normal heart sounds.  Pulmonary:     Effort: Pulmonary effort is normal. No respiratory distress.     Breath sounds: Normal breath sounds. No wheezing, rhonchi or rales.  Musculoskeletal:     Right lower leg: Edema present.     Left lower leg: Edema present.     Comments: Bilateral lower extremity edema; trace pitting on right; 1+ pitting on left  Skin:    General: Skin is warm.     Coloration: Skin is not jaundiced or pale.     Findings: Erythema (bilateral lower extremities) present. No bruising, lesion or rash.     Comments: Cracking noted to dorsal aspect of right foot and lateral aspect of left foot near heel.  Weeping present from left lower extremity.  Neurological:     Mental Status: He is alert and oriented to person, place, and time.     Comments: Difficulty hearing - baseline  Psychiatric:        Mood and Affect: Mood normal.        Behavior: Behavior normal.        Thought Content: Thought content normal.        Judgment: Judgment normal.       Assessment & Plan:   Problem List Items Addressed This Visit      Other   Leg swelling - Primary    Acute, ongoing x 1 month.  Likely due to chronic venous stasis.  Legs erythematous at  baseline and no s/s cellultiis today.  Will check BMET today to monitor kidney function - do not want to increase Lasix if kidney function has worsened.  Unna boots applied today, has responded well with Unna boots in the past.  Return to clinic in 2 days for re-evaluation.  Keep appointment with Cardiologist tomorrow as well.      Relevant Orders   Basic Metabolic Panel       Follow up plan: Return in about 2 days (around 12/04/2020) for leg swelling f/u.

## 2020-12-02 NOTE — Telephone Encounter (Signed)
Left message to call back  

## 2020-12-02 NOTE — Telephone Encounter (Signed)
Patient's daughter returning call. 

## 2020-12-02 NOTE — Assessment & Plan Note (Signed)
Acute, ongoing x 1 month.  Likely due to chronic venous stasis.  Legs erythematous at baseline and no s/s cellultiis today.  Will check BMET today to monitor kidney function - do not want to increase Lasix if kidney function has worsened.  Unna boots applied today, has responded well with Unna boots in the past.  Return to clinic in 2 days for re-evaluation.  Keep appointment with Cardiologist tomorrow as well.

## 2020-12-02 NOTE — Patient Instructions (Signed)

## 2020-12-02 NOTE — Telephone Encounter (Signed)
Spoke to the daughter who informed me that the patient had seen the PCP for his problem after her initial call this morning. Asking if the changes being made tomorrow to his device could have anything to do with what is going on. Checked with device clinic and let the daughter know these two thing are not related.  Grateful for call back and verbalized understanding

## 2020-12-02 NOTE — Telephone Encounter (Signed)
Pt c/o swelling: STAT is pt has developed SOB within 24 hours  1) How much weight have you gained and in what time span? No noticeable weight gain  2) If swelling, where is the swelling located? Feet, ankles, and calves  3) Are you currently taking a fluid pill? Yes   4) Are you currently SOB? Patient's daughter states she is not sure.  5) Do you have a log of your daily weights (if so, list)? No   6) Have you gained 3 pounds in a day or 5 pounds in a week? No   7) Have you traveled recently? No

## 2020-12-03 ENCOUNTER — Ambulatory Visit (INDEPENDENT_AMBULATORY_CARE_PROVIDER_SITE_OTHER): Payer: Medicare Other | Admitting: Emergency Medicine

## 2020-12-03 DIAGNOSIS — I459 Conduction disorder, unspecified: Secondary | ICD-10-CM

## 2020-12-03 LAB — BASIC METABOLIC PANEL
BUN/Creatinine Ratio: 17 (calc) (ref 6–22)
BUN: 22 mg/dL (ref 7–25)
CO2: 28 mmol/L (ref 20–32)
Calcium: 9.3 mg/dL (ref 8.6–10.3)
Chloride: 108 mmol/L (ref 98–110)
Creat: 1.32 mg/dL — ABNORMAL HIGH (ref 0.70–1.11)
Glucose, Bld: 93 mg/dL (ref 65–99)
Potassium: 4.5 mmol/L (ref 3.5–5.3)
Sodium: 144 mmol/L (ref 135–146)

## 2020-12-03 NOTE — Progress Notes (Signed)
Pacemaker check in clinic to assess PMT episodes and RV high outputs.. Normal device function. Thresholds, sensing, impedances consistent with previous measurements. Device programmed to maximize longevity. Industry present for testing. 8 PMT events with EGMs that show retrograde around 310 ms. Retrograde test did not show retrograde at this check. PVARP programmed to 350 ms. No high ventricular rates noted. Evoked response test reran and received the same reading as manual  RV threshold test done at this visit. Device programmed at appropriate safety margins. Histogram distribution appropriate for patient activity level. Device programmed to optimize intrinsic conduction. Estimated longevity 6 years , 6 month. Patient education completed.

## 2020-12-04 ENCOUNTER — Ambulatory Visit (INDEPENDENT_AMBULATORY_CARE_PROVIDER_SITE_OTHER): Payer: Medicare Other | Admitting: Nurse Practitioner

## 2020-12-04 ENCOUNTER — Other Ambulatory Visit: Payer: Self-pay

## 2020-12-04 ENCOUNTER — Encounter: Payer: Self-pay | Admitting: Nurse Practitioner

## 2020-12-04 VITALS — BP 125/67 | HR 65 | Temp 97.2°F | Ht 73.0 in

## 2020-12-04 DIAGNOSIS — I739 Peripheral vascular disease, unspecified: Secondary | ICD-10-CM | POA: Diagnosis not present

## 2020-12-04 MED ORDER — BETAMETHASONE DIPROPIONATE 0.05 % EX CREA: TOPICAL_CREAM | Freq: Two times a day (BID) | CUTANEOUS | 1 refills | Status: DC

## 2020-12-04 NOTE — Progress Notes (Signed)
Subjective:    Patient ID: [name], male    DOB: 02/27/1919, 87 y.o.   MRN: [id number]  HPI: [name] is a 87 y.o. male presenting for leg swelling.  Chief Complaint  Patient presents with  . Follow-up   LEG SWELLING Applied Unna boots earlier this week; daughter reports patient has not slept since we applied them. Reports wearing compression stockings but they are not very tight anymore. Duration: months Pain: yes Severity: moderate  Quality:  throbbing Location:  lower legs Bilateral:  yes Onset: sudden Frequency: constant Time of day: at random Sudden unintentional leg jerking:   no Paresthesias:   yes Decreased sensation:  no Weakness:   no Insomnia:   no Fatigue:   no  Chest pain: no Shortness of breath: no Alleviating factors: elevation Aggravating factors: nothing Status: worse Treatments attempted: elevation  Allergies  Allergen Reactions  . Codeine Nausea And Vomiting  . Percocet [Oxycodone-Acetaminophen] Nausea And Vomiting    Outpatient Encounter Medications as of 12/04/2020  Medication Sig  . amLODipine (NORVASC) 5 MG tablet TAKE 1 TABLET BY MOUTH  DAILY  . aspirin EC 81 MG tablet Take 1 tablet (81 mg total) by mouth daily.  . betamethasone dipropionate 0.05 % cream Apply topically 2 (two) times daily. Apply small amount to red areas on legs twice daily.  . clotrimazole (LOTRIMIN) 1 % cream Apply 1 application topically 2 (two) times daily. Apply to external ear for 1 week  . enalapril (VASOTEC) 20 MG tablet TAKE 1 TABLET BY MOUTH  DAILY  . escitalopram (LEXAPRO) 10 MG tablet TAKE 1 TABLET BY MOUTH EVERY DAY  . furosemide (LASIX) 20 MG tablet TAKE 2 TABLETS BY MOUTH  DAILY  . levothyroxine (SYNTHROID) 112 MCG tablet TAKE 1 TABLET BY MOUTH  DAILY  . LORazepam (ATIVAN) 0.5 MG tablet TAKE 1 TABLET BY MOUTH EVERY 8 HOURS AS NEEDED FOR ANXIETY.  Marland Kitchen neomycin-polymyxin-hydrocortisone (CORTISPORIN) OTIC solution Place 3 drops into the left ear 4  (four) times daily. For 5 days  . NONFORMULARY OR COMPOUNDED ITEM See pharmacy note  . vitamin B-12 (CYANOCOBALAMIN) 1000 MCG tablet Take 1 tablet (1,000 mcg total) by mouth daily.  . [DISCONTINUED] famotidine (PEPCID) 20 MG tablet Take 20 mg by mouth 2 (two) times daily.   No facility-administered encounter medications on file as of 12/04/2020.    Patient Active Problem List   Diagnosis Date Noted  . Leg swelling 12/02/2020  . Heart block 05/27/2020  . Fall 05/27/2020  . Heart block AV complete ([place]) 05/27/2020  . Hypothyroidism 11/07/2018  . CKD (chronic kidney disease) stage 3, GFR 30-59 ml/min 11/07/2018  . Abnormal liver function 11/07/2018  . NHL (non-Hodgkin's lymphoma) ([place]) 11/07/2018  . Venous stasis dermatitis 05/28/2015  . Insomnia 05/28/2015  . Cancer ([place]) 09/05/2013  . Lymphadenopathy, cervical 08/29/2013    Class: Chronic  . Acid reflux   . Hypertension   . Peripheral vascular disease ([place])     Past Medical History:  Diagnosis Date  . Acid reflux   . Cancer ([place]) 09/05/13   left neck-non-hodgkins lymphoma  . Hypertension    requires diuretic  . Hypothyroidism   . Malaria    while in the service  . Peripheral vascular disease ([facility])    slow wound healing on legs    Relevant past medical, surgical, family and social history reviewed and updated as indicated. Interim medical history since our last visit reviewed.  Review of Systems  Constitutional: Negative  for activity change, appetite change, fatigue, fever and unexpected weight change.  Respiratory: Negative.  Negative for cough, chest tightness, shortness of breath and wheezing.   Cardiovascular: Negative.  Negative for chest pain and leg swelling.  Musculoskeletal: Negative.   Skin: Negative.   Neurological: Negative.   Hematological: Negative.   Psychiatric/Behavioral: Negative.     Per HPI unless specifically indicated above     Objective:    BP 125/67   Pulse 65   Temp (!) 97.2 F (36.2  C)   Ht 6\' 1"  (1.854 m)   SpO2 100%   BMI 23.75 kg/m   Wt Readings from Last 3 Encounters:  12/02/20 180 lb (81.6 kg)  09/07/20 189 lb (85.7 kg)  05/28/20 198 lb 10.2 oz (90.1 kg)    Physical Exam Vitals and nursing note reviewed.  Constitutional:      General: He is not in acute distress.    Appearance: Normal appearance. He is not toxic-appearing.  Cardiovascular:     Rate and Rhythm: Normal rate and regular rhythm.     Heart sounds: Normal heart sounds.  Pulmonary:     Effort: Pulmonary effort is normal. No respiratory distress.     Breath sounds: Normal breath sounds. No wheezing, rhonchi or rales.  Musculoskeletal:     Right lower leg: No edema.     Left lower leg: No edema.  Skin:    General: Skin is warm.     Coloration: Skin is not jaundiced or pale.     Findings: Erythema (bilateral lower extremities - baseline per daughter) present. No bruising, lesion or rash.     Comments: Cracking noted to dorsal aspect of right foot near toes.  Neurological:     Mental Status: He is alert and oriented to person, place, and time.     Comments: Difficulty hearing - baseline  Psychiatric:        Mood and Affect: Mood normal.        Behavior: Behavior normal.        Thought Content: Thought content normal.        Judgment: Judgment normal.       Assessment & Plan:   Problem List Items Addressed This Visit      Cardiovascular and Mediastinum   Peripheral vascular disease ([place]) - Primary (Chronic)    I think his lower extremities are closer back to his baseline.  The swelling has improved greatly; does have some reddened areas.  Unna boots removed today.  Encouraged good cleansing and hygiene of lower extremities and to wear compression socks that are tight nightly to help prevent swelling.  Will refill steroid cream per request that was previously prescribed by PCP for chronic venous stasis.          Follow up plan: Return if symptoms worsen or fail to improve.

## 2020-12-04 NOTE — Assessment & Plan Note (Addendum)
I think his lower extremities are closer back to his baseline.  The swelling has improved greatly; does have some reddened areas.  Unna boots removed today.  Encouraged good cleansing and hygiene of lower extremities and to wear compression socks that are tight nightly to help prevent swelling.  Will refill steroid cream per request that was previously prescribed by PCP for chronic venous stasis.

## 2020-12-10 DIAGNOSIS — H353212 Exudative age-related macular degeneration, right eye, with inactive choroidal neovascularization: Secondary | ICD-10-CM | POA: Diagnosis not present

## 2020-12-10 DIAGNOSIS — H353124 Nonexudative age-related macular degeneration, left eye, advanced atrophic with subfoveal involvement: Secondary | ICD-10-CM | POA: Diagnosis not present

## 2020-12-10 DIAGNOSIS — H31013 Macula scars of posterior pole (postinflammatory) (post-traumatic), bilateral: Secondary | ICD-10-CM | POA: Diagnosis not present

## 2020-12-10 DIAGNOSIS — H43813 Vitreous degeneration, bilateral: Secondary | ICD-10-CM | POA: Diagnosis not present

## 2020-12-14 NOTE — Progress Notes (Signed)
Remote pacemaker transmission.   

## 2020-12-17 ENCOUNTER — Other Ambulatory Visit: Payer: Self-pay | Admitting: Family Medicine

## 2020-12-17 NOTE — Telephone Encounter (Signed)
Ok to refill??  Last office visit 12/04/2020.  Last refill 10/12/2020.

## 2021-01-01 DIAGNOSIS — L98499 Non-pressure chronic ulcer of skin of other sites with unspecified severity: Secondary | ICD-10-CM | POA: Diagnosis not present

## 2021-01-01 DIAGNOSIS — Z85828 Personal history of other malignant neoplasm of skin: Secondary | ICD-10-CM | POA: Diagnosis not present

## 2021-01-01 DIAGNOSIS — C44219 Basal cell carcinoma of skin of left ear and external auricular canal: Secondary | ICD-10-CM | POA: Diagnosis not present

## 2021-01-01 DIAGNOSIS — C4442 Squamous cell carcinoma of skin of scalp and neck: Secondary | ICD-10-CM | POA: Diagnosis not present

## 2021-01-01 DIAGNOSIS — C44212 Basal cell carcinoma of skin of right ear and external auricular canal: Secondary | ICD-10-CM | POA: Diagnosis not present

## 2021-01-14 DIAGNOSIS — L98499 Non-pressure chronic ulcer of skin of other sites with unspecified severity: Secondary | ICD-10-CM | POA: Diagnosis not present

## 2021-01-14 DIAGNOSIS — C44219 Basal cell carcinoma of skin of left ear and external auricular canal: Secondary | ICD-10-CM | POA: Diagnosis not present

## 2021-02-09 DIAGNOSIS — C44219 Basal cell carcinoma of skin of left ear and external auricular canal: Secondary | ICD-10-CM | POA: Diagnosis not present

## 2021-02-09 DIAGNOSIS — C44229 Squamous cell carcinoma of skin of left ear and external auricular canal: Secondary | ICD-10-CM | POA: Diagnosis not present

## 2021-02-18 ENCOUNTER — Other Ambulatory Visit: Payer: Self-pay

## 2021-02-18 ENCOUNTER — Encounter: Payer: Self-pay | Admitting: Podiatry

## 2021-02-18 ENCOUNTER — Ambulatory Visit (INDEPENDENT_AMBULATORY_CARE_PROVIDER_SITE_OTHER): Payer: Medicare Other | Admitting: Podiatry

## 2021-02-18 DIAGNOSIS — I739 Peripheral vascular disease, unspecified: Secondary | ICD-10-CM | POA: Diagnosis not present

## 2021-02-18 DIAGNOSIS — M792 Neuralgia and neuritis, unspecified: Secondary | ICD-10-CM

## 2021-02-18 DIAGNOSIS — L97421 Non-pressure chronic ulcer of left heel and midfoot limited to breakdown of skin: Secondary | ICD-10-CM | POA: Diagnosis not present

## 2021-02-18 MED ORDER — PREGABALIN 50 MG PO CAPS: 50.0000 mg | ORAL_CAPSULE | Freq: Three times a day (TID) | ORAL | 1 refills | Status: DC

## 2021-02-18 NOTE — Progress Notes (Signed)
Subjective:  Patient ID: [name], male    DOB: [birth year],  MRN: [id number]  Chief Complaint  Patient presents with  . Nail Problem    Nail trim RFC    87 y.o. male presents for wound care.  Patient presents with primary complaint with complaint of left heel ulceration.  Patient states that this has been going on for quite some time.  Patient putting excessive pressure to the medial side of the heel that could have led to the start of it.  They have been doing local wound care which seems to have gotten better.  Is still present overall.  They have been putting some Neosporin and a Band-Aid.  He also has secondary complaint neuropathic pain.  He is currently on gabapentin which is not helping.  Him and his wife would like to know if they can do Lyrica.  No other medication has helped thus far.   Review of Systems: Negative except as noted in the HPI. Denies N/V/F/Ch.  Past Medical History:  Diagnosis Date  . Acid reflux   . Cancer ([place]) 09/05/13   left neck-non-hodgkins lymphoma  . Hypertension    requires diuretic  . Hypothyroidism   . Malaria    while in the service  . Peripheral vascular disease ([facility])    slow wound healing on legs    Current Outpatient Medications:  .  pregabalin (LYRICA) 50 MG capsule, Take 1 capsule (50 mg total) by mouth 3 (three) times daily., Disp: 30 capsule, Rfl: 1 .  amLODipine (NORVASC) 5 MG tablet, TAKE 1 TABLET BY MOUTH  DAILY, Disp: 90 tablet, Rfl: 3 .  aspirin EC 81 MG tablet, Take 1 tablet (81 mg total) by mouth daily., Disp: , Rfl:  .  betamethasone dipropionate 0.05 % cream, Apply topically 2 (two) times daily. Apply small amount to red areas on legs twice daily., Disp: 45 g, Rfl: 1 .  clotrimazole (LOTRIMIN) 1 % cream, Apply 1 application topically 2 (two) times daily. Apply to external ear for 1 week, Disp: 30 g, Rfl: 0 .  enalapril (VASOTEC) 20 MG tablet, TAKE 1 TABLET BY MOUTH  DAILY, Disp: 90 tablet, Rfl: 3 .  escitalopram (LEXAPRO) 10  MG tablet, TAKE 1 TABLET BY MOUTH EVERY DAY, Disp: 90 tablet, Rfl: 2 .  furosemide (LASIX) 20 MG tablet, TAKE 2 TABLETS BY MOUTH  DAILY, Disp: 180 tablet, Rfl: 3 .  levothyroxine (SYNTHROID) 112 MCG tablet, TAKE 1 TABLET BY MOUTH  DAILY, Disp: 90 tablet, Rfl: 3 .  LORazepam (ATIVAN) 0.5 MG tablet, TAKE 1 TABLET BY MOUTH EVERY 8 HOURS AS NEEDED FOR ANXIETY, Disp: 60 tablet, Rfl: 0 .  neomycin-polymyxin-hydrocortisone (CORTISPORIN) OTIC solution, Place 3 drops into the left ear 4 (four) times daily. For 5 days, Disp: 10 mL, Rfl: 0 .  NONFORMULARY OR COMPOUNDED ITEM, See pharmacy note, Disp: 120 each, Rfl: 2 .  vitamin B-12 (CYANOCOBALAMIN) 1000 MCG tablet, Take 1 tablet (1,000 mcg total) by mouth daily., Disp: 30 tablet, Rfl: 5  Social History   Tobacco Use  Smoking Status Former Smoker  . Types: Cigars  Smokeless Tobacco Former Systems developer  . Quit date: 10/05/1979    Allergies  Allergen Reactions  . Codeine Nausea And Vomiting  . Percocet [Oxycodone-Acetaminophen] Nausea And Vomiting   Objective:  There were no vitals filed for this visit. There is no height or weight on file to calculate BMI. Constitutional Well developed. Well nourished.  Vascular Dorsalis pedis pulses palpable bilaterally. Posterior tibial  pulses palpable bilaterally. Capillary refill normal to all digits.  No cyanosis or clubbing noted. Pedal hair growth normal.  Neurologic Normal speech. Oriented to person, place, and time. Protective sensation absent  Dermatologic Wound Location: Left heel medial side ulcer limited to the breakdown of the skin Wound Base: Mixed Granular/Fibrotic Peri-wound: Calloused Exudate: Scant/small amount Serosanguinous exudate Wound Measurements: -See below  Orthopedic: No pain to palpation either foot.   Radiographs: None Assessment:   1. Peripheral vascular disease ([place])   2. Ulcer of heel, left, limited to breakdown of skin ([place])   3. Neuropathic pain    Plan:  Patient was  evaluated and treated and all questions answered.  Ulcer left heel ulcer with limited to the breakdown of the skin -Debridement as below. -Dressed with Neosporin and Band-Aid,  -Continue off-loading with surgical shoe.   Neuropathic pain -I explained to the patient the etiology of neuropathic pain and various treatment options were discussed.  Given the amount of pain that is having I believe patient will benefit from transition from gabapentin to Lyrica since the gabapentin is not helping.  I discussed all the side effects with Lyrica and drug interaction with Lyrica.  He like to proceed despite all the risks. -Lyrica was sent to the pharmacy  Procedure: Excisional Debridement of Wound Tool: Sharp chisel blade/tissue nipper Rationale: Removal of non-viable soft tissue from the wound to promote healing.  Anesthesia: none Pre-Debridement Wound Measurements: 0.6 cm x 0.4 cm x 0.2 cm  Post-Debridement Wound Measurements: 0.7 cm x 0.4 cm x 0.2 cm  Type of Debridement: Sharp Excisional Tissue Removed: Non-viable soft tissue Blood loss: Minimal (<50cc) Depth of Debridement: subcutaneous tissue. Technique: Sharp excisional debridement to bleeding, viable wound base.  Wound Progress: This is my initial evaluation we will continue monitor the progression of it. Site healing conversation 7 Dressing: Dry, sterile, compression dressing. Disposition: Patient tolerated procedure well. Patient to return in 1 week for follow-up.  No follow-ups on file.

## 2021-02-22 DIAGNOSIS — C44212 Basal cell carcinoma of skin of right ear and external auricular canal: Secondary | ICD-10-CM | POA: Diagnosis not present

## 2021-02-24 ENCOUNTER — Telehealth: Payer: Self-pay | Admitting: Family Medicine

## 2021-02-24 NOTE — Telephone Encounter (Signed)
Received call from patient's daughter; patient having hallucinations from new medication prescribed by podiatrist; the podiatrist referred the patient to this office for follow up. Appt scheduled for Friday, 02/26/21.

## 2021-02-26 ENCOUNTER — Encounter: Payer: Self-pay | Admitting: Family Medicine

## 2021-02-26 ENCOUNTER — Telehealth: Payer: Self-pay

## 2021-02-26 ENCOUNTER — Other Ambulatory Visit: Payer: Self-pay

## 2021-02-26 ENCOUNTER — Ambulatory Visit (INDEPENDENT_AMBULATORY_CARE_PROVIDER_SITE_OTHER): Payer: Medicare Other

## 2021-02-26 ENCOUNTER — Ambulatory Visit (INDEPENDENT_AMBULATORY_CARE_PROVIDER_SITE_OTHER): Payer: Medicare Other | Admitting: Family Medicine

## 2021-02-26 VITALS — BP 108/74 | HR 90 | Ht 73.0 in | Wt 191.4 lb

## 2021-02-26 DIAGNOSIS — I442 Atrioventricular block, complete: Secondary | ICD-10-CM

## 2021-02-26 DIAGNOSIS — M792 Neuralgia and neuritis, unspecified: Secondary | ICD-10-CM | POA: Diagnosis not present

## 2021-02-26 MED ORDER — TRIAMCINOLONE ACETONIDE 0.1 % EX CREA: 1.0000 "application " | TOPICAL_CREAM | Freq: Three times a day (TID) | CUTANEOUS | 1 refills | Status: DC

## 2021-02-26 NOTE — Progress Notes (Signed)
Subjective:    Patient ID: [name], male    DOB: [birth year], 87 y.o.   MRN: [id number]  6/21 Patient is a very pleasant 87 year old Caucasian male who presents today with his daughter.  His wife recently passed away.  Since that time, the patient has been extremely upset.  He has very little appetite.  He reports feeling early satiety.  He also reports increasing anxiety.  He reports grieving and sadness.  He reports depression.  At times he feels extremely anxious and his blood pressure will shoot up causing him to have a headache and feel dizzy.  They have been treating his dizziness with meclizine which seems to help relax him.  He denies any abdominal pain or melena or hematochezia.  He denies any fevers or chills.  He denies any chest pain or shortness of breath or dyspnea on exertion.  He is extremely sedentary and is confined essentially to a wheelchair due to weakness in his legs.  Family is requesting physical therapy at home as he is extremely high fall risk.  02/26/21 Patient has peripheral neuropathy.  In the past we tried him on gabapentin which helped for a while and it stopped working so he discontinued it.  Recently saw a podiatrist who started him on Lyrica for peripheral neuropathy causing severe pain in both feet.  For a few days, the pain was markedly better.  However he then started hallucinating at night.  His daughter blamed it on the Lyrica and discontinue the medication.  However we reviewed her medications and she was giving him Benadryl for a possible allergic reaction along with Ativan to help him sleep and the Lyrica all at the same time.  Therefore the hallucinations may have been due to the polypharmacy.  She has since stopped all the medications and the hallucinations have resolved however he continues to report severe neuropathic pain in both feet. Past Medical History:  Diagnosis Date  . Acid reflux   . Cancer ([place]) 09/05/13   left neck-non-hodgkins lymphoma  .  Hypertension    requires diuretic  . Hypothyroidism   . Malaria    while in the service  . Peripheral vascular disease ([place])    slow wound healing on legs   Past Surgical History:  Procedure Laterality Date  . AMPUTATION Left 06/25/2015   Procedure: LEFT  HALLUX AMPUTATION,;  Surgeon: Wylene Simmer, MD;  Location: [facility];  Service: Orthopedics;  Laterality: Left;  LOCAL/MAC  . CHOLECYSTECTOMY N/A 11/08/2018   Procedure: LAPAROSCOPIC CHOLECYSTECTOMY WITH INTRAOPERATIVE CHOLANGIOGRAM;  Surgeon: [name], MD;  Location: [facility];  Service: General;  Laterality: N/A;  . MASS BIOPSY Left 09/05/2013   Procedure: EXCISIONAL BIOPSY OF LEFT NECK MASS;  Surgeon: Jerrell Belfast, MD;  Location: [facility];  Service: ENT;  Laterality: Left;  . PACEMAKER IMPLANT N/A 05/28/2020   Procedure: PACEMAKER IMPLANT;  Surgeon: Thompson Grayer, MD;  Location: [facility];  Service: Cardiovascular;  Laterality: N/A;  . TONSILLECTOMY    . YAG LASER APPLICATION Right 7/74/1287   Procedure: YAG LASER APPLICATION;  Surgeon: [name], MD;  Location: [facility];  Service: Ophthalmology;  Laterality: Right;   Current Outpatient Medications on File Prior to Visit  Medication Sig Dispense Refill  . amLODipine (NORVASC) 5 MG tablet TAKE 1 TABLET BY MOUTH  DAILY 90 tablet 3  . aspirin EC 81 MG tablet Take 1 tablet (81 mg total) by mouth daily.    . betamethasone dipropionate 0.05 %  cream Apply topically 2 (two) times daily. Apply small amount to red areas on legs twice daily. 45 g 1  . clotrimazole (LOTRIMIN) 1 % cream Apply 1 application topically 2 (two) times daily. Apply to external ear for 1 week 30 g 0  . enalapril (VASOTEC) 20 MG tablet TAKE 1 TABLET BY MOUTH  DAILY 90 tablet 3  . escitalopram (LEXAPRO) 10 MG tablet TAKE 1 TABLET BY MOUTH EVERY DAY 90 tablet 2  . furosemide (LASIX) 20 MG tablet TAKE 2 TABLETS BY MOUTH  DAILY 180 tablet 3  . levothyroxine (SYNTHROID) 112 MCG tablet TAKE 1 TABLET BY  MOUTH  DAILY 90 tablet 3  . LORazepam (ATIVAN) 0.5 MG tablet TAKE 1 TABLET BY MOUTH EVERY 8 HOURS AS NEEDED FOR ANXIETY 60 tablet 0  . neomycin-polymyxin-hydrocortisone (CORTISPORIN) OTIC solution Place 3 drops into the left ear 4 (four) times daily. For 5 days 10 mL 0  . NONFORMULARY OR COMPOUNDED ITEM See pharmacy note 120 each 2  . vitamin B-12 (CYANOCOBALAMIN) 1000 MCG tablet Take 1 tablet (1,000 mcg total) by mouth daily. 30 tablet 5  . pregabalin (LYRICA) 50 MG capsule Take 1 capsule (50 mg total) by mouth 3 (three) times daily. (Patient not taking: Reported on 02/26/2021) 30 capsule 1  . [DISCONTINUED] famotidine (PEPCID) 20 MG tablet Take 20 mg by mouth 2 (two) times daily.     No current facility-administered medications on file prior to visit.   Allergies  Allergen Reactions  . Codeine Nausea And Vomiting  . Percocet [Oxycodone-Acetaminophen] Nausea And Vomiting   Social History   Socioeconomic History  . Marital status: Widowed    Spouse name: Not on file  . Number of children: 4  . Years of education: Not on file  . Highest education level: Not on file  Occupational History  . Occupation: retired  Tobacco Use  . Smoking status: Former Smoker    Types: Cigars  . Smokeless tobacco: Former Systems developer    Quit date: 10/05/1979  Substance and Sexual Activity  . Alcohol use: No  . Drug use: No  . Sexual activity: Not on file  Other Topics Concern  . Not on file  Social History Narrative  . Not on file   Social Determinants of Health   Financial Resource Strain: Not on file  Food Insecurity: Not on file  Transportation Needs: Not on file  Physical Activity: Not on file  Stress: Not on file  Social Connections: Not on file  Intimate Partner Violence: Not on file      Review of Systems  Neurological: Positive for dizziness.  All other systems reviewed and are negative.      Objective:   Physical Exam Vitals reviewed.  Constitutional:      General: He is not in  acute distress.    Appearance: He is well-developed. He is not diaphoretic.  HENT:     Right Ear: External ear normal.     Left Ear: External ear normal.     Mouth/Throat:     Pharynx: No oropharyngeal exudate.  Cardiovascular:     Rate and Rhythm: Normal rate and regular rhythm.     Heart sounds: Normal heart sounds. No murmur heard. No friction rub. No gallop.   Pulmonary:     Effort: Pulmonary effort is normal. No respiratory distress.     Breath sounds: Normal breath sounds. No wheezing or rales.  Chest:     Chest wall: No tenderness.  Abdominal:  General: Bowel sounds are normal. There is no distension.     Palpations: Abdomen is soft.     Tenderness: There is no abdominal tenderness. There is no guarding or rebound.  Musculoskeletal:     Lumbar back: No spasms, tenderness or bony tenderness. Normal range of motion.     Right hip: No tenderness. Normal range of motion. Decreased strength.     Left hip: No tenderness. Normal range of motion. Decreased strength.           Assessment & Plan:  Peripheral neuropathic pain  Recommended trying Lyrica 50 mg at night without Ativan and without Benadryl.  If he does not experience hallucinations, we can gradually uptitrate the medication as tolerated.  If he does experience hallucinations, we will obviously discontinue the Lyrica.  The next that would be to try nortriptyline or amitriptyline for neuropathic pain.  We also discussed possibly trying tramadol.  I recommended they avoid Benadryl and Ativan.  Daughter is also interested in weaning him off Lexapro.  I recommended that we make 1 medication change at a time until we see how he tolerates the Lyrica.  However I am perfectly willing to wean him off Lexapro if he is tolerating the Lyrica and his pain improves.

## 2021-02-26 NOTE — Telephone Encounter (Signed)
The patient daughter states he did not understand what the person was trying to tell him today. I did not see a phone note for this patient. I told her it could have been because he is due for a transmission today but we have not received it. She is going to help him later today. I gave her verbal instructions on how to send the transmission. She states if it does not go thru she will give [place] a call on Monday.

## 2021-03-02 LAB — CUP PACEART REMOTE DEVICE CHECK
Battery Remaining Longevity: 122 mo
Battery Remaining Percentage: 95.5 %
Battery Voltage: 3.01 V
Brady Statistic AP VP Percent: 18 %
Brady Statistic AP VS Percent: 1 %
Brady Statistic AS VP Percent: 80 %
Brady Statistic AS VS Percent: 1 %
Brady Statistic RA Percent Paced: 16 %
Brady Statistic RV Percent Paced: 98 %
Date Time Interrogation Session: 20220401191128
Implantable Lead Implant Date: 20210701
Implantable Lead Implant Date: 20210701
Implantable Lead Location: 753859
Implantable Lead Location: 753860
Implantable Pulse Generator Implant Date: 20210701
Lead Channel Impedance Value: 400 Ohm
Lead Channel Impedance Value: 480 Ohm
Lead Channel Pacing Threshold Amplitude: 0.5 V
Lead Channel Pacing Threshold Amplitude: 1.125 V
Lead Channel Pacing Threshold Pulse Width: 0.5 ms
Lead Channel Pacing Threshold Pulse Width: 0.5 ms
Lead Channel Sensing Intrinsic Amplitude: 1.9 mV
Lead Channel Sensing Intrinsic Amplitude: 12 mV
Lead Channel Setting Pacing Amplitude: 0.75 V
Lead Channel Setting Pacing Amplitude: 2.125
Lead Channel Setting Pacing Pulse Width: 0.5 ms
Lead Channel Setting Sensing Sensitivity: 2 mV
Pulse Gen Model: 2272
Pulse Gen Serial Number: 3842377

## 2021-03-08 ENCOUNTER — Telehealth: Payer: Self-pay | Admitting: Family Medicine

## 2021-03-08 NOTE — Progress Notes (Signed)
  Chronic Care Management   Note  03/08/2021 Name: [name] MRN: [id number] DOB: 09/02/1919  [name] is a 87 y.o. year old male who is a primary care patient of Pickard, Cammie Mcgee, MD. I reached out to Delton Coombes by phone today in response to a referral sent by [name]'s PCP, Susy Frizzle, MD.   [name] was given information about Chronic Care Management services today including:  1. CCM service includes personalized support from designated clinical staff supervised by his physician, including individualized plan of care and coordination with other care providers 2. 24/7 contact phone numbers for assistance for urgent and routine care needs. 3. Service will only be billed when office clinical staff spend 20 minutes or more in a month to coordinate care. 4. Only one practitioner may furnish and bill the service in a calendar month. 5. The patient may stop CCM services at any time (effective at the end of the month) by phone call to the office staff.   Patient agreed to services and verbal consent obtained.   Follow up plan:   Carley Perdue [employer] Scheduler

## 2021-03-09 ENCOUNTER — Ambulatory Visit: Payer: Medicare Other | Admitting: Podiatry

## 2021-03-09 ENCOUNTER — Other Ambulatory Visit: Payer: Self-pay

## 2021-03-09 ENCOUNTER — Encounter: Payer: Self-pay | Admitting: Podiatry

## 2021-03-09 DIAGNOSIS — L97421 Non-pressure chronic ulcer of left heel and midfoot limited to breakdown of skin: Secondary | ICD-10-CM | POA: Diagnosis not present

## 2021-03-09 DIAGNOSIS — I739 Peripheral vascular disease, unspecified: Secondary | ICD-10-CM

## 2021-03-09 NOTE — Progress Notes (Signed)
Subjective:  Patient ID: MONTEZ CUDA, male    DOB: 1919/03/28,  MRN: [id number]  Chief Complaint  Patient presents with  . Foot Ulcer    Follow up wound left heel    87 y.o. male presents for wound care.  Patient presents with a follow-up of left heel ulceration.  Patient seems to be doing well.  Has most closed up.  They denied any other acute complaints.  They also want to know that they are having some drug interaction with Lyrica which they are working with the primary care doctor.   Review of Systems: Negative except as noted in the HPI. Denies N/V/F/Ch.  Past Medical History:  Diagnosis Date  . Acid reflux   . Cancer ([place]) 09/05/13   left neck-non-hodgkins lymphoma  . Hypertension    requires diuretic  . Hypothyroidism   . Malaria    while in the service  . Peripheral vascular disease (HCC)    slow wound healing on legs    Current Outpatient Medications:  .  amLODipine (NORVASC) 5 MG tablet, TAKE 1 TABLET BY MOUTH  DAILY, Disp: 90 tablet, Rfl: 3 .  aspirin EC 81 MG tablet, Take 1 tablet (81 mg total) by mouth daily., Disp: , Rfl:  .  betamethasone dipropionate 0.05 % cream, Apply topically 2 (two) times daily. Apply small amount to red areas on legs twice daily., Disp: 45 g, Rfl: 1 .  clotrimazole (LOTRIMIN) 1 % cream, Apply 1 application topically 2 (two) times daily. Apply to external ear for 1 week, Disp: 30 g, Rfl: 0 .  enalapril (VASOTEC) 20 MG tablet, TAKE 1 TABLET BY MOUTH  DAILY, Disp: 90 tablet, Rfl: 3 .  escitalopram (LEXAPRO) 10 MG tablet, TAKE 1 TABLET BY MOUTH EVERY DAY, Disp: 90 tablet, Rfl: 2 .  furosemide (LASIX) 20 MG tablet, TAKE 2 TABLETS BY MOUTH  DAILY, Disp: 180 tablet, Rfl: 3 .  levothyroxine (SYNTHROID) 112 MCG tablet, TAKE 1 TABLET BY MOUTH  DAILY, Disp: 90 tablet, Rfl: 3 .  LORazepam (ATIVAN) 0.5 MG tablet, TAKE 1 TABLET BY MOUTH EVERY 8 HOURS AS NEEDED FOR ANXIETY, Disp: 60 tablet, Rfl: 0 .  neomycin-polymyxin-hydrocortisone (CORTISPORIN) OTIC  solution, Place 3 drops into the left ear 4 (four) times daily. For 5 days, Disp: 10 mL, Rfl: 0 .  NONFORMULARY OR COMPOUNDED ITEM, See pharmacy note, Disp: 120 each, Rfl: 2 .  pregabalin (LYRICA) 50 MG capsule, Take 1 capsule (50 mg total) by mouth 3 (three) times daily. (Patient not taking: Reported on 02/26/2021), Disp: 30 capsule, Rfl: 1 .  triamcinolone (KENALOG) 0.1 %, Apply 1 application topically 3 (three) times daily., Disp: 30 g, Rfl: 1 .  vitamin B-12 (CYANOCOBALAMIN) 1000 MCG tablet, Take 1 tablet (1,000 mcg total) by mouth daily., Disp: 30 tablet, Rfl: 5  Social History   Tobacco Use  Smoking Status Former Smoker  . Types: Cigars  Smokeless Tobacco Former Systems developer  . Quit date: 10/05/1979    Allergies  Allergen Reactions  . Codeine Nausea And Vomiting  . Percocet [Oxycodone-Acetaminophen] Nausea And Vomiting   Objective:  There were no vitals filed for this visit. There is no height or weight on file to calculate BMI. Constitutional Well developed. Well nourished.  Vascular Dorsalis pedis pulses palpable bilaterally. Posterior tibial pulses palpable bilaterally. Capillary refill normal to all digits.  No cyanosis or clubbing noted. Pedal hair growth normal.  Neurologic Normal speech. Oriented to person, place, and time. Protective sensation absent  Dermatologic Wound  Location: Left heel medial side ulcer limited to the breakdown of the skin.  Clinically has completely reepithelialized.  No concern for ulceration noted.  Orthopedic: No pain to palpation either foot.   Radiographs: None Assessment:   1. Ulcer of heel, left, limited to breakdown of skin ([place])   2. Peripheral vascular disease ([place])    Plan:  Patient was evaluated and treated and all questions answered.  Ulcer left heel ulcer with limited to the breakdown of the skin -Clinically healed.  They can still keep it covered with a Band-Aid.  Continue offloading the area so that way it does not bleed  breakdown.  I discussed shoe gear modification with them as well.   Neuropathic pain -I explained to the patient the etiology of neuropathic pain and various treatment options were discussed.  Given the amount of pain that is having I believe patient will benefit from transition from gabapentin to Lyrica since the gabapentin is not helping.  I discussed all the side effects with Lyrica and drug interaction with Lyrica.  He like to proceed despite all the risks. -I discussed with the patient and his caretaker that they can adjust the Lyrica as needed.  No follow-ups on file.

## 2021-03-09 NOTE — Progress Notes (Signed)
Remote pacemaker transmission.   

## 2021-03-11 ENCOUNTER — Ambulatory Visit: Payer: Medicare Other | Admitting: Podiatry

## 2021-03-24 ENCOUNTER — Telehealth: Payer: Self-pay | Admitting: Pharmacist

## 2021-03-24 NOTE — Progress Notes (Signed)
Chronic Care Management Pharmacy Note  03/25/2021 Name:  [name] MRN:  [id number] DOB:  [birth year]  Subjective: [name] is an 87 y.o. year old male who is a primary patient of Pickard, Cammie Mcgee, MD.  The CCM team was consulted for assistance with disease management and care coordination needs.    Engaged with patient face to face for initial visit in response to provider referral for pharmacy case management and/or care coordination services.   Consent to Services:  The patient was given the following information about Chronic Care Management services today, agreed to services, and gave verbal consent: 1. CCM service includes personalized support from designated clinical staff supervised by the primary care provider, including individualized plan of care and coordination with other care providers 2. 24/7 contact phone numbers for assistance for urgent and routine care needs. 3. Service will only be billed when office clinical staff spend 20 minutes or more in a month to coordinate care. 4. Only one practitioner may furnish and bill the service in a calendar month. 5.The patient may stop CCM services at any time (effective at the end of the month) by phone call to the office staff. 6. The patient will be responsible for cost sharing (co-pay) of up to 20% of the service fee (after annual deductible is met). Patient agreed to services and consent obtained.  Patient Care Team: Susy Frizzle, MD as PCP - General (Family Medicine) Buford Dresser, MD as PCP - Cardiology (Cardiology) Edythe Clarity, [facility] as Pharmacist (Pharmacist)  Recent office visits: 02/26/21 Dr. Dennard Schaumann. For peripheral vascular disease/Medication change. STARTED Triamcinolone Acetonide 0.1% 3 times daily. CHANGED Lyrica to 50 mg at night.  12/04/20 Eulogio Bear, NP. For peripheral vascular disease. STARTED betamethasone dipropionate 0.05% topical 2 times daily  12/02/20 Eulogio Bear, NP. For leg  swelling. Per note: the doctor felt like the leg swelling occurred from chronic venous stasis. No medication changes.  Recent consult visits: 03/09/21 Podiatry Felipa Furnace DPM. For Ulcer of left heel. No medication changes.  02/18/21 Podiatry Felipa Furnace DPM. For peripheral vascular disease. STARTED Pregabalin 50 mg 3 times daily.  11/19/20 Podiatry Felipa Furnace DPM. For pressure injury of left heel. Per note: The patient and doctor discussed off loading his feet while sitting and laying down. No medication changes.  Hospital visits: None in previous 6 months  Objective:  Lab Results  Component Value Date   CREATININE 1.32 (H) 12/02/2020   BUN 22 12/02/2020   GFRNONAA 43 (L) 05/27/2020   GFRAA 50 (L) 05/27/2020   NA 144 12/02/2020   K 4.5 12/02/2020   CALCIUM 9.3 12/02/2020   CO2 28 12/02/2020   GLUCOSE 93 12/02/2020    Lab Results  Component Value Date/Time   HGBA1C 5.4 01/18/2017 05:00 AM    Last diabetic Eye exam: No results found for: HMDIABEYEEXA  Last diabetic Foot exam: No results found for: HMDIABFOOTEX   No results found for: CHOL, HDL, LDLCALC, LDLDIRECT, TRIG, CHOLHDL  Hepatic Function Latest Ref Rng & Units 05/27/2020 05/18/2020 11/29/2018  Total Protein 6.5 - 8.1 g/dL 8.1 7.6 7.4  Albumin 3.5 - 5.0 g/dL 4.8 - -  AST 15 - 41 U/L '25 19 20  ' ALT 0 - 44 U/L '14 9 14  ' Alk Phosphatase 38 - 126 U/L 51 - -  Total Bilirubin 0.3 - 1.2 mg/dL 1.0 0.6 0.6  Bilirubin, Direct 0.0 - 0.2 mg/dL - - -    Lab  Results  Component Value Date/Time   TSH 2.59 05/18/2020 11:26 AM   TSH 0.75 02/20/2018 10:29 AM    CBC Latest Ref Rng & Units 05/27/2020 05/18/2020 11/29/2018  WBC 4.0 - 10.5 K/uL 6.5 5.7 4.9  Hemoglobin 13.0 - 17.0 g/dL 14.2 14.1 13.7  Hematocrit 39.0 - 52.0 % 43.3 41.5 40.3  Platelets 150 - 400 K/uL 182 163 205    No results found for: VD25OH  Clinical ASCVD: No  The ASCVD Risk score Mikey Bussing DC Jr., et al., 2013) failed to calculate for the following  reasons:   The 2013 ASCVD risk score is only valid for ages 28 to 52    Depression screen PHQ 2/9 05/18/2020 02/20/2018 11/27/2017  Decreased Interest 1 0 0  Down, Depressed, Hopeless 1 0 0  PHQ - 2 Score 2 0 0  Altered sleeping 1 - -  Tired, decreased energy 1 - -  Change in appetite 1 - -  Feeling bad or failure about yourself  1 - -  Trouble concentrating 1 - -  Moving slowly or fidgety/restless 1 - -  Suicidal thoughts 0 - -  PHQ-9 Score 8 - -  Difficult doing work/chores Not difficult at all - -      Social History   Tobacco Use  Smoking Status Former Smoker  . Types: Cigars  Smokeless Tobacco Former Systems developer  . Quit date: 10/05/1979   BP Readings from Last 3 Encounters:  02/26/21 108/74  12/04/20 125/67  12/02/20 128/80   Pulse Readings from Last 3 Encounters:  02/26/21 90  12/04/20 65  12/02/20 81   Wt Readings from Last 3 Encounters:  02/26/21 191 lb 6.4 oz (86.8 kg)  12/02/20 180 lb (81.6 kg)  09/07/20 189 lb (85.7 kg)   BMI Readings from Last 3 Encounters:  02/26/21 25.25 kg/m  12/04/20 23.75 kg/m  12/02/20 23.75 kg/m    Assessment/Interventions: Review of patient past medical history, allergies, medications, health status, including review of consultants reports, laboratory and other test data, was performed as part of comprehensive evaluation and provision of chronic care management services.   SDOH:  (Social Determinants of Health) assessments and interventions performed: Yes  SDOH Screenings   Alcohol Screen: Not on file  Depression (PHQ2-9): Medium Risk  . PHQ-2 Score: 8  Financial Resource Strain: Low Risk   . Difficulty of Paying Living Expenses: Not very hard  Food Insecurity: Not on file  Housing: Not on file  Physical Activity: Not on file  Social Connections: Not on file  Stress: Not on file  Tobacco Use: Medium Risk  . Smoking Tobacco Use: Former Smoker  . Smokeless Tobacco Use: Former Soil scientist Needs: Not on file     [place]  Allergies  Allergen Reactions  . Codeine Nausea And Vomiting  . Percocet [Oxycodone-Acetaminophen] Nausea And Vomiting    Medications Reviewed Today    Reviewed by Edythe Clarity, [facility] (Pharmacist) on 03/25/21 at 1615  Med List Status: <None>  Medication Order Taking? Sig Documenting Provider Last Dose Status Informant  amLODipine (NORVASC) 5 MG tablet [id number]  TAKE 1 TABLET BY MOUTH  DAILY Susy Frizzle, MD  Active   aspirin EC 81 MG tablet [id number]  Take 1 tablet (81 mg total) by mouth daily. Jerrell Belfast, MD  Active Child  betamethasone dipropionate 0.05 % cream [id number]  Apply topically 2 (two) times daily. Apply small amount to red areas on legs twice daily. Eulogio Bear, NP  Active  clotrimazole (LOTRIMIN) 1 % cream [id number]  Apply 1 application topically 2 (two) times daily. Apply to external ear for 1 week Alycia Rossetti, MD  Active   enalapril (VASOTEC) 20 MG tablet [id number]  TAKE 1 TABLET BY MOUTH  DAILY Susy Frizzle, MD  Active   escitalopram (LEXAPRO) 10 MG tablet [id number] No TAKE 1 TABLET BY MOUTH EVERY DAY  Patient not taking: Reported on 03/25/2021   Annie Main, FNP Not Taking Active         Discontinued 02/13/12 1334   furosemide (LASIX) 20 MG tablet [id number]  TAKE 2 TABLETS BY MOUTH  DAILY Susy Frizzle, MD  Active   levothyroxine (SYNTHROID) 112 MCG tablet [id number]  TAKE 1 TABLET BY MOUTH  DAILY Susy Frizzle, MD  Active   neomycin-polymyxin-hydrocortisone (CORTISPORIN) OTIC solution [id number]  Place 3 drops into the left ear 4 (four) times daily. For 5 days Alycia Rossetti, MD  Active   NONFORMULARY OR COMPOUNDED ITEM [id number]  See pharmacy note Felipa Furnace, DPM  Active   pregabalin (LYRICA) 50 MG capsule [id number] Yes Take 1 capsule (50 mg total) by mouth 3 (three) times daily. Felipa Furnace, DPM Taking Active            Med Note (Isabellah Sobocinski L   Thu Mar 25, 2021  4:15 PM) Taking at night  only   triamcinolone (KENALOG) 0.1 % [id number]  Apply 1 application topically 3 (three) times daily. Susy Frizzle, MD  Active   vitamin B-12 (CYANOCOBALAMIN) 1000 MCG tablet [id number]  Take 1 tablet (1,000 mcg total) by mouth daily. Susy Frizzle, MD  Active Child          Patient Active Problem List   Diagnosis Date Noted  . Leg swelling 12/02/2020  . Heart block 05/27/2020  . Fall 05/27/2020  . Heart block AV complete ([place]) 05/27/2020  . Hypothyroidism 11/07/2018  . CKD (chronic kidney disease) stage 3, GFR 30-59 ml/min 11/07/2018  . Abnormal liver function 11/07/2018  . NHL (non-Hodgkin's lymphoma) ([place]) 11/07/2018  . Venous stasis dermatitis 05/28/2015  . Insomnia 05/28/2015  . Cancer ([place]) 09/05/2013  . Lymphadenopathy, cervical 08/29/2013    Class: Chronic  . Acid reflux   . Hypertension   . Peripheral vascular disease ([place])     Immunization History  Administered Date(s) Administered  . Fluad Quad(high Dose 65+) 09/09/2019, 09/07/2020  . Influenza, High Dose Seasonal PF 09/09/2015, 09/26/2017, 08/27/2018  . Influenza,inj,Quad PF,6+ Mos 09/11/2014, 09/02/2016  . Influenza-Unspecified 09/26/2017  . PFIZER(Purple Top)SARS-COV-2 Vaccination 12/13/2019, 01/03/2020  . Pneumococcal Conjugate-13 04/10/2014  . Pneumococcal Polysaccharide-23 09/09/2019    Conditions to be addressed/monitored:  HTN, Acid Reflux, Hypothryoidism, CKD, Insomnia  Care Plan : General Pharmacy (Adult)  Updates made by Edythe Clarity, RPH since 03/25/2021 12:00 AM    Problem: HTN, Acid Reflux, Hypothryoidism, CKD, Insomnia   Priority: High  Onset Date: 03/25/2021    Long-Range Goal: Patient-Specific Goal   Start Date: 03/25/2021  Expected End Date: 09/24/2021  This Visit's Progress: On track  Priority: High  Note:   Current Barriers:  . Unable to achieve control of neuropathy.   Pharmacist Clinical Goal(s):  Marland Kitchen Patient will achieve control of neuropathy as evidenced by pain  level and activity. . contact provider office for questions/concerns as evidenced notation of same in electronic health record through collaboration with PharmD and provider.   Interventions: . 1:1 collaboration with Susy Frizzle, MD regarding development and update  of comprehensive plan of care as evidenced by provider attestation and co-signature . Inter-disciplinary care team collaboration (see longitudinal plan of care) . Comprehensive medication review performed; medication list updated in electronic medical record  Hypertension (BP goal <140/90) -Controlled -Current treatment: . Amlodipine 59m daily . Enalapril 262mdaily -Medications previously tried: clonidine 0.53m8mCurrent home readings: none available, daughter is a nurMarine scientistd checks if he is symptomatic but no logs today  -Current exercise habits: he loves to walk, however he is having severe neuropathic pain in feet taking Lyrica, he is often unstable on his feet when he takes this during the day. -Denies hypotensive/hypertensive symptoms -Educated on BP goals and benefits of medications for prevention of heart attack, stroke and kidney damage; Importance of home blood pressure monitoring; Symptoms of hypotension and importance of maintaining adequate hydration; -Counseled to monitor BP at home when symptomatic, document, and provide log at future appointments -Recommended to continue current medication  Neuropathy(Goal: Minimize symptoms) -Controlled -Current treatment  . Lyrica 70m60medications previously tried: none noted -Reports this has helped his pain and since he stopped other medications he has not had any hallucinations.  However, when he takes it during the daytime he often feels unstable on his feet.  -Main goal for him would be to be able to feel good enough to get out and walk outside when he wants to do that. -Recommended to continue current medication Collaborated with PCP on possible dose change to  25mg58ming the day and 70mg 63might to see if patient tolerates the lower dose better during the daytime.  Patient and wife were agreeable.  Will follow up with PCP confirmation.   Insomnia (Goal: Healthy sleep patterns) -Controlled -Current treatment  . None -Medications previously tried: Ativan, Benadryl -No longer taking these medications as thee combination of this was causing him hallucinations with addition of Lyrica -Reports sleep has been good since just taking Lyrica at bedtime, he has not been having hallucinations with just Lyrica alone  -Recommended continue current management strategy  Hypothyroidism (Goal: Maintain TSH) -Controlled -Current treatment  . Levothyroxine 112mcg 60my -Medications previously tried: none noted -taking appropriately, TSH WNL  -Recommended to continue current medication   Acid reflux (Goal: Minimize symptoms) -Controlled -Current treatment  . Omeprazole OTC . Tums OTC -Medications previously tried: famotidine -Counseled on appropriate timing of medication. -States symptoms are controlled.   -Recommended to continue current medication   Patient Goals/Self-Care Activities . Patient will:  - take medications as prescribed check blood pressure as needed, document, and provide at future appointments Await decision on dose change for Lyrica  Follow Up Plan: The care management team will reach out to the patient again over the next 120 days.        Medication Assistance: None required.  Patient affirms current coverage meets needs.  Patient's preferred pharmacy is:  [id number] [id number], [place] [phone] [place] [place] [phone] box? Yes Pt endorses 100%  compliance  We discussed: Benefits of medication synchronization, packaging and delivery as well as enhanced pharmacist oversight with [employer]. Patient decided to: Continue current medication management strategy  Care Plan and Follow Up Patient Decision:  Patient agrees to Care Plan and Follow-up.  Plan: The care management  team will reach out to the patient again over the next 120 days.  Beverly Milch, PharmD Clinical Pharmacist [place] [phone]

## 2021-03-24 NOTE — Progress Notes (Addendum)
Chronic Care Management Pharmacy Assistant   Name: KITO CUFFE  MRN: [id number] DOB: Oct 26, 1919  [name] is an 87 y.o. year old male who presents for his initial CCM visit with the clinical pharmacist.  Reason for Encounter: Chart Prep   Conditions to be addressed/monitored: HTN, Acid Reflux, Insomnia, Hypothyroidism, Stage 3 CKD.  Primary concerns for visit include: Ulcer on left heel.  Recent office visits:  02/26/21 Dr. Dennard Schaumann. For peripheral vascular disease/Medication change. STARTED Triamcinolone Acetonide 0.1% 3 times daily. CHANGED Lyrica to 50 mg at night.  12/04/20 Eulogio Bear, NP. For peripheral vascular disease. STARTED betamethasone dipropionate 0.05% topical 2 times daily  12/02/20 Eulogio Bear, NP. For leg swelling. Per note: the doctor felt like the leg swelling occurred from chronic venous stasis. No medication changes.   Recent consult visits:  03/09/21 Podiatry Felipa Furnace DPM. For Ulcer of left heel. No medication changes.  02/18/21 Podiatry Felipa Furnace DPM. For peripheral vascular disease. STARTED Pregabalin 50 mg 3 times daily.  11/19/20 Podiatry Felipa Furnace DPM. For pressure injury of left heel. Per note: The patient and doctor discussed off loading his feet while sitting and laying down. No medication changes.  Hospital visits:  None in previous 6 months  Medications: Outpatient Encounter Medications as of 03/24/2021  Medication Sig Note   amLODipine (NORVASC) 5 MG tablet TAKE 1 TABLET BY MOUTH  DAILY    aspirin EC 81 MG tablet Take 1 tablet (81 mg total) by mouth daily.    betamethasone dipropionate 0.05 % cream Apply topically 2 (two) times daily. Apply small amount to red areas on legs twice daily.    clotrimazole (LOTRIMIN) 1 % cream Apply 1 application topically 2 (two) times daily. Apply to external ear for 1 week    enalapril (VASOTEC) 20 MG tablet TAKE 1 TABLET BY MOUTH  DAILY    escitalopram (LEXAPRO) 10 MG tablet TAKE 1  TABLET BY MOUTH EVERY DAY    furosemide (LASIX) 20 MG tablet TAKE 2 TABLETS BY MOUTH  DAILY    levothyroxine (SYNTHROID) 112 MCG tablet TAKE 1 TABLET BY MOUTH  DAILY    LORazepam (ATIVAN) 0.5 MG tablet TAKE 1 TABLET BY MOUTH EVERY 8 HOURS AS NEEDED FOR ANXIETY    neomycin-polymyxin-hydrocortisone (CORTISPORIN) OTIC solution Place 3 drops into the left ear 4 (four) times daily. For 5 days    NONFORMULARY OR COMPOUNDED ITEM See pharmacy note    pregabalin (LYRICA) 50 MG capsule Take 1 capsule (50 mg total) by mouth 3 (three) times daily. (Patient not taking: Reported on 02/26/2021) 02/26/2021: Stopped due to hallucinations   triamcinolone (KENALOG) 0.1 % Apply 1 application topically 3 (three) times daily.    vitamin B-12 (CYANOCOBALAMIN) 1000 MCG tablet Take 1 tablet (1,000 mcg total) by mouth daily.    [DISCONTINUED] famotidine (PEPCID) 20 MG tablet Take 20 mg by mouth 2 (two) times daily.    No facility-administered encounter medications on file as of 03/24/2021.    Have you seen any other providers since your last visit? Patient stated no.   Any changes in your medications or health? Patient stated no.  Any side effects from any medications? Patient stated no   Do you have an symptoms or problems not managed by your medications? Patient stated no.  Any concerns about your health right now? Patient stated no.  Has your provider asked that you check blood pressure, blood sugar, or follow special diet at home? Patient  stated he checks his blood pressure daily, daughter helps.   Do you get any type of exercise on a regular basis? Patient stated he walks outside when the weather permits.   Can you think of a goal you would like to reach for your health? Patient stated for his foot to completely heal over.   Do you have any problems getting your medications? Patient stated no.  Is there anything that you would like to discuss during the appointment? Patient stated no.  Please bring  medications and supplements to appointment, patient reminded of face to face  appointment [place], [place] Pharmacist Assistant [phone]

## 2021-03-25 ENCOUNTER — Other Ambulatory Visit: Payer: Self-pay

## 2021-03-25 ENCOUNTER — Ambulatory Visit (INDEPENDENT_AMBULATORY_CARE_PROVIDER_SITE_OTHER): Payer: Medicare Other | Admitting: Pharmacist

## 2021-03-25 DIAGNOSIS — I10 Essential (primary) hypertension: Secondary | ICD-10-CM | POA: Diagnosis not present

## 2021-03-25 DIAGNOSIS — E039 Hypothyroidism, unspecified: Secondary | ICD-10-CM | POA: Diagnosis not present

## 2021-03-25 DIAGNOSIS — G4700 Insomnia, unspecified: Secondary | ICD-10-CM

## 2021-03-25 DIAGNOSIS — M792 Neuralgia and neuritis, unspecified: Secondary | ICD-10-CM

## 2021-03-25 NOTE — Patient Instructions (Addendum)
Visit Information  Goals Addressed            This Visit's Progress   . Manage My Medicine       Timeframe:  Long-Range Goal Priority:  High Start Date: 03/25/21                             Expected End Date: 09/24/21                      Follow Up Date 06/27/21   - call if I am sick and can't take my medicine - keep a list of all the medicines I take; vitamins and herbals too - use a pillbox to sort medicine - use an alarm clock or phone to remind me to take my medicine    Why is this important?   . These steps will help you keep on track with your medicines.   Notes: Keep track of adverse effects from medications.      Patient Care Plan: General Pharmacy (Adult)    Problem Identified: HTN, Acid Reflux, Hypothryoidism, CKD, Insomnia   Priority: High  Onset Date: 03/25/2021    Long-Range Goal: Patient-Specific Goal   Start Date: 03/25/2021  Expected End Date: 09/24/2021  This Visit's Progress: On track  Priority: High  Note:   Current Barriers:  . Unable to achieve control of neuropathy.   Pharmacist Clinical Goal(s):  Marland Kitchen Patient will achieve control of neuropathy as evidenced by pain level and activity. . contact provider office for questions/concerns as evidenced notation of same in electronic health record through collaboration with PharmD and provider.   Interventions: . 1:1 collaboration with Susy Frizzle, MD regarding development and update of comprehensive plan of care as evidenced by provider attestation and co-signature . Inter-disciplinary care team collaboration (see longitudinal plan of care) . Comprehensive medication review performed; medication list updated in electronic medical record  Hypertension (BP goal <140/90) -Controlled -Current treatment: . Amlodipine 5mg  daily . Enalapril 20mg  daily -Medications previously tried: clonidine 0.1mg  -Current home readings: none available, daughter is a nurse and checks if he is symptomatic but no logs  today  -Current exercise habits: he loves to walk, however he is having severe neuropathic pain in feet taking Lyrica, he is often unstable on his feet when he takes this during the day. -Denies hypotensive/hypertensive symptoms -Educated on BP goals and benefits of medications for prevention of heart attack, stroke and kidney damage; Importance of home blood pressure monitoring; Symptoms of hypotension and importance of maintaining adequate hydration; -Counseled to monitor BP at home when symptomatic, document, and provide log at future appointments -Recommended to continue current medication  Neuropathy(Goal: Minimize symptoms) -Controlled -Current treatment  . Lyrica 50mg  -Medications previously tried: none noted -Reports this has helped his pain and since he stopped other medications he has not had any hallucinations.  However, when he takes it during the daytime he often feels unstable on his feet.  -Main goal for him would be to be able to feel good enough to get out and walk outside when he wants to do that. -Recommended to continue current medication Collaborated with PCP on possible dose change to 25mg  during the day and 50mg  at night to see if patient tolerates the lower dose better during the daytime.  Patient and wife were agreeable.  Will follow up with PCP confirmation.   Insomnia (Goal: Healthy sleep patterns) -Controlled -Current treatment  .  None -Medications previously tried: Ativan, Benadryl -No longer taking these medications as thee combination of this was causing him hallucinations with addition of Lyrica -Reports sleep has been good since just taking Lyrica at bedtime, he has not been having hallucinations with just Lyrica alone  -Recommended continue current management strategy  Hypothyroidism (Goal: Maintain TSH) -Controlled -Current treatment  . Levothyroxine 19mcg daily -Medications previously tried: none noted -taking appropriately, TSH WNL   -Recommended to continue current medication   Acid reflux (Goal: Minimize symptoms) -Controlled -Current treatment  . Omeprazole OTC . Tums OTC -Medications previously tried: famotidine -Counseled on appropriate timing of medication. -States symptoms are controlled.   -Recommended to continue current medication   Patient Goals/Self-Care Activities . Patient will:  - take medications as prescribed check blood pressure as needed, document, and provide at future appointments Await decision on dose change for Lyrica  Follow Up Plan: The care management team will reach out to the patient again over the next 120 days.       Mr. Baskette was given information about Chronic Care Management services today including:  1. CCM service includes personalized support from designated clinical staff supervised by his physician, including individualized plan of care and coordination with other care providers 2. 24/7 contact phone numbers for assistance for urgent and routine care needs. 3. Standard insurance, coinsurance, copays and deductibles apply for chronic care management only during months in which we provide at least 20 minutes of these services. Most insurances cover these services at 100%, however patients may be responsible for any copay, coinsurance and/or deductible if applicable. This service may help you avoid the need for more expensive face-to-face services. 4. Only one practitioner may furnish and bill the service in a calendar month. 5. The patient may stop CCM services at any time (effective at the end of the month) by phone call to the office staff.  Patient agreed to services and verbal consent obtained.   The patient verbalized understanding of instructions, educational materials, and care plan provided today and agreed to receive a mailed copy of patient instructions, educational materials, and care plan.  Telephone follow up appointment with pharmacy team member scheduled  for: 6 months  Edythe Clarity, [facility]  Neuropathic Pain Neuropathic pain is pain caused by damage to the nerves that are responsible for certain sensations in your body (sensory nerves). The pain can be caused by:  Damage to the sensory nerves that send signals to your spinal cord and brain (peripheral nervous system).  Damage to the sensory nerves in your brain or spinal cord (central nervous system). Neuropathic pain can make you more sensitive to pain. Even a minor sensation can feel very painful. This is usually a long-term condition that can be difficult to treat. The type of pain differs from person to person. It may:  Start suddenly (acute), or it may develop slowly and last for a long time (chronic).  Come and go as damaged nerves heal, or it may stay at the same level for years.  Cause emotional distress, loss of sleep, and a lower quality of life. What are the causes? The most common cause of this condition is diabetes. Many other diseases and conditions can also cause neuropathic pain. Causes of neuropathic pain can be classified as:  Toxic. This is caused by medicines and chemicals. The most common cause of toxic neuropathic pain is damage from cancer treatments (chemotherapy).  Metabolic. This can be caused by: ? Diabetes. This is the most common  disease that damages the nerves. ? Lack of vitamin B from long-term alcohol abuse.  Traumatic. Any injury that cuts, crushes, or stretches a nerve can cause damage and pain. A common example is feeling pain after losing an arm or leg (phantom limb pain).  Compression-related. If a sensory nerve gets trapped or compressed for a long period of time, the blood supply to the nerve can be cut off.  Vascular. Many blood vessel diseases can cause neuropathic pain by decreasing blood supply and oxygen to nerves.  Autoimmune. This type of pain results from diseases in which the body's defense system (immune system) mistakenly attacks  sensory nerves. Examples of autoimmune diseases that can cause neuropathic pain include lupus and multiple sclerosis.  Infectious. Many types of viral infections can damage sensory nerves and cause pain. Shingles infection is a common cause of this type of pain.  Inherited. Neuropathic pain can be a symptom of many diseases that are passed down through families (genetic). What increases the risk? You are more likely to develop this condition if:  You have diabetes.  You smoke.  You drink too much alcohol.  You are taking certain medicines, including medicines that kill cancer cells (chemotherapy) or that treat immune system disorders. What are the signs or symptoms? The main symptom is pain. Neuropathic pain is often described as:  Burning.  Shock-like.  Stinging.  Hot or cold.  Itching. How is this diagnosed? No single test can diagnose neuropathic pain. It is diagnosed based on:  Physical exam and your symptoms. Your health care provider will ask you about your pain. You may be asked to use a pain scale to describe how bad your pain is.  Tests. These may be done to see if you have a high sensitivity to pain and to help find the cause and location of any sensory nerve damage. They include: ? Nerve conduction studies to test how well nerve signals travel through your sensory nerves (electrodiagnostic testing). ? Stimulating your sensory nerves through electrodes on your skin and measuring the response in your spinal cord and brain (somatosensory evoked potential).  Imaging studies, such as: ? X-rays. ? CT scan. ? MRI. How is this treated? Treatment for neuropathic pain may change over time. You may need to try different treatment options or a combination of treatments. Some options include:  Treating the underlying cause of the neuropathy, such as diabetes, kidney disease, or vitamin deficiencies.  Stopping medicines that can cause neuropathy, such as  chemotherapy.  Medicine to relieve pain. Medicines may include: ? Prescription or over-the-counter pain medicine. ? Anti-seizure medicine. ? Antidepressant medicines. ? Pain-relieving patches that are applied to painful areas of skin. ? A medicine to numb the area (local anesthetic), which can be injected as a nerve block.  Transcutaneous nerve stimulation. This uses electrical currents to block painful nerve signals. The treatment is painless.  Alternative treatments, such as: ? Acupuncture. ? Meditation. ? Massage. ? Physical therapy. ? Pain management programs. ? Counseling. Follow these instructions at home: Medicines  Take over-the-counter and prescription medicines only as told by your health care provider.  Do not drive or use heavy machinery while taking prescription pain medicine.  If you are taking prescription pain medicine, take actions to prevent or treat constipation. Your health care provider may recommend that you: ? Drink enough fluid to keep your urine pale yellow. ? Eat foods that are high in fiber, such as fresh fruits and vegetables, whole grains, and beans. ? Limit foods  that are high in fat and processed sugars, such as fried or sweet foods. ? Take an over-the-counter or prescription medicine for constipation.   Lifestyle  Have a good support system at home.  Consider joining a chronic pain support group.  Do not use any products that contain nicotine or tobacco, such as cigarettes and e-cigarettes. If you need help quitting, ask your health care provider.  Do not drink alcohol.   General instructions  Learn as much as you can about your condition.  Work closely with all your health care providers to find the treatment plan that works best for you.  Ask your health care provider what activities are safe for you.  Keep all follow-up visits as told by your health care provider. This is important. Contact a health care provider if:  Your pain  treatments are not working.  You are having side effects from your medicines.  You are struggling with tiredness (fatigue), mood changes, depression, or anxiety. Summary  Neuropathic pain is pain caused by damage to the nerves that are responsible for certain sensations in your body (sensory nerves).  Neuropathic pain may come and go as damaged nerves heal, or it may stay at the same level for years.  Neuropathic pain is usually a long-term condition that can be difficult to treat. Consider joining a chronic pain support group. This information is not intended to replace advice given to you by your health care provider. Make sure you discuss any questions you have with your health care provider. Document Revised: 03/07/2019 Document Reviewed: 12/01/2017 Elsevier Patient Education  2021 [employer].

## 2021-03-26 ENCOUNTER — Other Ambulatory Visit: Payer: Self-pay | Admitting: Family Medicine

## 2021-03-26 MED ORDER — PREGABALIN 25 MG PO CAPS: ORAL_CAPSULE | ORAL | 2 refills | Status: DC

## 2021-05-17 ENCOUNTER — Telehealth: Payer: Self-pay | Admitting: Family Medicine

## 2021-05-17 ENCOUNTER — Telehealth: Payer: Self-pay

## 2021-05-17 NOTE — Telephone Encounter (Signed)
Alert for long AT/AF event; EGM suggest A. Fib - ongoing; V rates controlled per histogram. No history noted in EMR .  Called and spoke to [place] ([facility]), states she will go to the patients house to send a manual transmission. Advised to call if any issues arise and I will call back to speak with patient to assess and advise on plan.

## 2021-05-17 NOTE — Telephone Encounter (Signed)
Manual transmission received. Presenting rhythm patient was back in SR. Patient reports over the weekend he felt fatigued and palpitations. Today patient is feeling better and was outside walking around during our conversation. Advised patient I will forward to Dr. Rayann Heman and we will call with any changes. Patient agreeable to plan.

## 2021-05-17 NOTE — Telephone Encounter (Signed)
No answer unable to leave a message for patient to call back and schedule Medicare Annual Wellness Visit (AWV) in office.   If not able to come in office, please offer to do virtually or by telephone.   Last AWV: 12/21/12015  Please schedule at anytime with [facility]-Nurse Health Advisor.  If any questions, please contact me at [phone]

## 2021-05-17 NOTE — Telephone Encounter (Signed)
I helped the patient daughter [name] send a manual transmission. Transmission received. I told her the nurse will review the transmission and give her a call back.

## 2021-05-28 ENCOUNTER — Encounter: Payer: Self-pay | Admitting: Internal Medicine

## 2021-05-28 ENCOUNTER — Ambulatory Visit (INDEPENDENT_AMBULATORY_CARE_PROVIDER_SITE_OTHER): Payer: Medicare Other

## 2021-05-28 ENCOUNTER — Telehealth: Payer: Self-pay

## 2021-05-28 DIAGNOSIS — I442 Atrioventricular block, complete: Secondary | ICD-10-CM | POA: Diagnosis not present

## 2021-05-28 LAB — CUP PACEART REMOTE DEVICE CHECK
Battery Remaining Longevity: 87 mo
Battery Remaining Percentage: 89 %
Battery Voltage: 3.01 V
Brady Statistic AP VP Percent: 20 %
Brady Statistic AP VS Percent: 1 %
Brady Statistic AS VP Percent: 78 %
Brady Statistic AS VS Percent: 1 %
Brady Statistic RA Percent Paced: 17 %
Brady Statistic RV Percent Paced: 98 %
Date Time Interrogation Session: 20220701020013
Implantable Lead Implant Date: 20210701
Implantable Lead Implant Date: 20210701
Implantable Lead Location: 753859
Implantable Lead Location: 753860
Implantable Pulse Generator Implant Date: 20210701
Lead Channel Impedance Value: 400 Ohm
Lead Channel Impedance Value: 440 Ohm
Lead Channel Pacing Threshold Amplitude: 0.5 V
Lead Channel Pacing Threshold Amplitude: 2.125 V
Lead Channel Pacing Threshold Pulse Width: 0.5 ms
Lead Channel Pacing Threshold Pulse Width: 0.5 ms
Lead Channel Sensing Intrinsic Amplitude: 1.6 mV
Lead Channel Sensing Intrinsic Amplitude: 11.9 mV
Lead Channel Setting Pacing Amplitude: 0.75 V
Lead Channel Setting Pacing Amplitude: 3.625
Lead Channel Setting Pacing Pulse Width: 0.5 ms
Lead Channel Setting Sensing Sensitivity: 2 mV
Pulse Gen Model: 2272
Pulse Gen Serial Number: 3842377

## 2021-05-28 NOTE — Telephone Encounter (Signed)
Continue to monitor for now

## 2021-05-28 NOTE — Telephone Encounter (Signed)
Scheduled remote pacemaker transmission reflects RA threshold trending up since January.  Pt currently in NSR following recent AF episode.   RA Autocapture currently programmed on, will need to test manually to determine if problem with lead or results due to auto programming.  Patietn scheduled for device clinic 06/09/21 11:20am.

## 2021-06-01 ENCOUNTER — Ambulatory Visit: Payer: Medicare Other | Admitting: Podiatry

## 2021-06-01 ENCOUNTER — Encounter: Payer: Self-pay | Admitting: Podiatry

## 2021-06-01 ENCOUNTER — Other Ambulatory Visit: Payer: Self-pay

## 2021-06-01 DIAGNOSIS — E8770 Fluid overload, unspecified: Secondary | ICD-10-CM | POA: Diagnosis not present

## 2021-06-01 DIAGNOSIS — I739 Peripheral vascular disease, unspecified: Secondary | ICD-10-CM

## 2021-06-01 DIAGNOSIS — R601 Generalized edema: Secondary | ICD-10-CM | POA: Diagnosis not present

## 2021-06-01 MED ORDER — DOXYCYCLINE HYCLATE 100 MG PO TABS: 100.0000 mg | ORAL_TABLET | Freq: Two times a day (BID) | ORAL | 0 refills | Status: AC

## 2021-06-01 NOTE — Progress Notes (Signed)
Subjective:  Patient ID: [name], male    DOB: 1919/09/21,  MRN: [id number]  Chief Complaint  Patient presents with   Wound Check    Left foot wound check     87 y.o. male presents with the above complaint.  Patient presents with complaint of bilateral swelling with right greater than left side.  There is some erythema/redness to the right foot.  Patient is here with his family member who states that he does not have any trauma to the area.  He has been on his dependent position however no more than usual.  He states that the right side started getting swollen for no reason.  He is not taking antibiotics he denies any other acute complaints he would like to discuss treatment options for this.   Review of Systems: Negative except as noted in the HPI. Denies N/V/F/Ch.  Past Medical History:  Diagnosis Date   Acid reflux    Cancer ([place]) 09/05/13   left neck-non-hodgkins lymphoma   Hypertension    requires diuretic   Hypothyroidism    Malaria    while in the service   Peripheral vascular disease ([place])    slow wound healing on legs    Current Outpatient Medications:    doxycycline (VIBRA-TABS) 100 MG tablet, Take 1 tablet (100 mg total) by mouth 2 (two) times daily for 10 days., Disp: 20 tablet, Rfl: 0   amLODipine (NORVASC) 5 MG tablet, TAKE 1 TABLET BY MOUTH  DAILY, Disp: 90 tablet, Rfl: 3   aspirin EC 81 MG tablet, Take 1 tablet (81 mg total) by mouth daily., Disp: , Rfl:    betamethasone dipropionate 0.05 % cream, Apply topically 2 (two) times daily. Apply small amount to red areas on legs twice daily., Disp: 45 g, Rfl: 1   clotrimazole (LOTRIMIN) 1 % cream, Apply 1 application topically 2 (two) times daily. Apply to external ear for 1 week, Disp: 30 g, Rfl: 0   enalapril (VASOTEC) 20 MG tablet, TAKE 1 TABLET BY MOUTH  DAILY, Disp: 90 tablet, Rfl: 3   escitalopram (LEXAPRO) 10 MG tablet, TAKE 1 TABLET BY MOUTH EVERY DAY (Patient not taking: Reported on 03/25/2021), Disp: 90  tablet, Rfl: 2   furosemide (LASIX) 20 MG tablet, TAKE 2 TABLETS BY MOUTH  DAILY, Disp: 180 tablet, Rfl: 3   levothyroxine (SYNTHROID) 112 MCG tablet, TAKE 1 TABLET BY MOUTH  DAILY, Disp: 90 tablet, Rfl: 3   neomycin-polymyxin-hydrocortisone (CORTISPORIN) OTIC solution, Place 3 drops into the left ear 4 (four) times daily. For 5 days, Disp: 10 mL, Rfl: 0   NONFORMULARY OR COMPOUNDED ITEM, See pharmacy note, Disp: 120 each, Rfl: 2   pregabalin (LYRICA) 25 MG capsule, 1 in AM, 2 in PM, Disp: 270 capsule, Rfl: 2   triamcinolone (KENALOG) 0.1 %, Apply 1 application topically 3 (three) times daily., Disp: 30 g, Rfl: 1   vitamin B-12 (CYANOCOBALAMIN) 1000 MCG tablet, Take 1 tablet (1,000 mcg total) by mouth daily., Disp: 30 tablet, Rfl: 5  Social History   Tobacco Use  Smoking Status Former   Pack years: 0.00   Types: Cigars  Smokeless Tobacco Former   Quit date: 10/05/1979    Allergies  Allergen Reactions   Codeine Nausea And Vomiting   Percocet [Oxycodone-Acetaminophen] Nausea And Vomiting   Objective:  There were no vitals filed for this visit. There is no height or weight on file to calculate BMI. Constitutional Well developed. Well nourished.  Vascular Dorsalis pedis pulses palpable  bilaterally. Posterior tibial pulses palpable bilaterally. Capillary refill normal to all digits.  No cyanosis or clubbing noted. Pedal hair growth normal.  Neurologic Normal speech. Oriented to person, place, and time. Epicritic sensation to light touch grossly present bilaterally.  Dermatologic Edema noted 2+ pitting to bilateral lower extremity.  Redness noted to the right side.  No redness noted to the left foot.  No open wounds or lesions noted no clinical signs of infection noted beside erythema.  Orthopedic: Normal joint ROM without pain or crepitus bilaterally. No visible deformities. No bony tenderness.   Radiographs: None Assessment:   1. Generalized edema due to fluid overload   2.  Peripheral vascular disease ([place])    Plan:  Patient was evaluated and treated and all questions answered.  Right foot erythema with underlying lymphedema -I explained to the patient the etiology of edema and various treatment options were extensively discussed.  Given that there is redness present I believe patient will benefit from doxycycline to decrease the inflammation/redness.  Patient agrees to the plan like proceed with taking doxycycline. -He will also benefit from aggressive compression therapy for now we will dispense compression anklet's.  If there is no resolve I will discuss Unna boot therapy.  Patient states understanding.  No follow-ups on file.

## 2021-06-09 ENCOUNTER — Other Ambulatory Visit: Payer: Self-pay

## 2021-06-09 ENCOUNTER — Ambulatory Visit (INDEPENDENT_AMBULATORY_CARE_PROVIDER_SITE_OTHER): Payer: Medicare Other

## 2021-06-09 DIAGNOSIS — I442 Atrioventricular block, complete: Secondary | ICD-10-CM

## 2021-06-09 LAB — CUP PACEART INCLINIC DEVICE CHECK
Battery Remaining Longevity: 126 mo
Battery Voltage: 3.01 V
Brady Statistic RA Percent Paced: 18 %
Brady Statistic RV Percent Paced: 98 %
Date Time Interrogation Session: 20220713124101
Implantable Lead Implant Date: 20210701
Implantable Lead Implant Date: 20210701
Implantable Lead Location: 753859
Implantable Lead Location: 753860
Implantable Pulse Generator Implant Date: 20210701
Lead Channel Impedance Value: 412.5 Ohm
Lead Channel Impedance Value: 462.5 Ohm
Lead Channel Pacing Threshold Amplitude: 0.625 V
Lead Channel Pacing Threshold Amplitude: 1.25 V
Lead Channel Pacing Threshold Pulse Width: 0.5 ms
Lead Channel Pacing Threshold Pulse Width: 0.5 ms
Lead Channel Sensing Intrinsic Amplitude: 1.5 mV
Lead Channel Sensing Intrinsic Amplitude: 10.4 mV
Lead Channel Setting Pacing Amplitude: 0.875
Lead Channel Setting Pacing Amplitude: 2.5 V
Lead Channel Setting Pacing Pulse Width: 0.5 ms
Lead Channel Setting Sensing Sensitivity: 2 mV
Pulse Gen Model: 2272
Pulse Gen Serial Number: 3842377

## 2021-06-09 NOTE — Progress Notes (Signed)
Patient seen in device clinic to check RA threshold d/t elevating trends. RA threshold checked bipolar 1.25 V @ 0.5 ms. RA threshold unipolar 1.0V @ 0.5 ms. Consulted with [employer]. Jude rep and agrees to leave RA threshold Bipolar. RA Autocapture turned off and RA output programmed at 2.5V. LRL decreased to 50 bpm. RA pacing is 18%, current RA sensing at 65 bpm.

## 2021-06-10 ENCOUNTER — Telehealth: Payer: Self-pay | Admitting: Pharmacist

## 2021-06-10 NOTE — Progress Notes (Addendum)
    Chronic Care Management Pharmacy Assistant   Name: [name]  MRN: [id number] DOB: [birth year]  Reason for Encounter: General Disease State   Conditions to be addressed/monitored: HTN, Acid Reflux, Hypothryoidism, CKD, Insomnia  Recent office visits:  None since 03/25/21  Recent consult visits:  06/01/21 Podiatry Felipa Furnace, DPM. For wound check. STARTED Doxycycline Hyclate 100 mg 2 times daily.   Hospital visits:  None since 03/25/21  Medications: Outpatient Encounter Medications as of 06/10/2021  Medication Sig Note   amLODipine (NORVASC) 5 MG tablet TAKE 1 TABLET BY MOUTH  DAILY    aspirin EC 81 MG tablet Take 1 tablet (81 mg total) by mouth daily.    betamethasone dipropionate 0.05 % cream Apply topically 2 (two) times daily. Apply small amount to red areas on legs twice daily.    clotrimazole (LOTRIMIN) 1 % cream Apply 1 application topically 2 (two) times daily. Apply to external ear for 1 week    doxycycline (VIBRA-TABS) 100 MG tablet Take 1 tablet (100 mg total) by mouth 2 (two) times daily for 10 days.    enalapril (VASOTEC) 20 MG tablet TAKE 1 TABLET BY MOUTH  DAILY    escitalopram (LEXAPRO) 10 MG tablet TAKE 1 TABLET BY MOUTH EVERY DAY (Patient not taking: Reported on 03/25/2021)    furosemide (LASIX) 20 MG tablet TAKE 2 TABLETS BY MOUTH  DAILY 03/25/2021: Taking one tablet daily   levothyroxine (SYNTHROID) 112 MCG tablet TAKE 1 TABLET BY MOUTH  DAILY    neomycin-polymyxin-hydrocortisone (CORTISPORIN) OTIC solution Place 3 drops into the left ear 4 (four) times daily. For 5 days    NONFORMULARY OR COMPOUNDED ITEM See pharmacy note    pregabalin (LYRICA) 25 MG capsule 1 in AM, 2 in PM    triamcinolone (KENALOG) 0.1 % Apply 1 application topically 3 (three) times daily.    vitamin B-12 (CYANOCOBALAMIN) 1000 MCG tablet Take 1 tablet (1,000 mcg total) by mouth daily.    [DISCONTINUED] famotidine (PEPCID) 20 MG tablet Take 20 mg by mouth 2 (two) times daily.    No  facility-administered encounter medications on file as of 06/10/2021.   GEN CALL: Patients daughter stated his neuropathy has been better since the medication has been changed. Patients daughter stated he has not had that many bad days. Patients daughter stated he does as much as he can for hisself and her and her siblings spend the night with him. Patients daughter stated he tries to walk some during the day. Patients daughter stated he had gone into Afib a few weeks back but the cardiologist was aware and was recently seen in the office yesterday. Patients daughter stated his appetite is very well, he eats three meals a day.The daughter is concerned about COVID. She stated her brother has [place]negard and he was at Mr. Schmelzle home Sunday night and tested positive on Monday. She wants to know if there is anything they can give her dad incase he starts showing symptoms of COVID.  Star Rating Drugs: N/A  Follow-Up:Pharmacist Review  Charlann Lange, RMA Clinical Pharmacist Assistant [phone]  10 minutes spent in review, coordination, and documentation.  Reviewed by: Beverly Milch, PharmD Clinical Pharmacist [facility] [phone]

## 2021-06-15 ENCOUNTER — Other Ambulatory Visit: Payer: Self-pay | Admitting: Family Medicine

## 2021-06-15 DIAGNOSIS — J189 Pneumonia, unspecified organism: Secondary | ICD-10-CM

## 2021-06-16 DIAGNOSIS — H353124 Nonexudative age-related macular degeneration, left eye, advanced atrophic with subfoveal involvement: Secondary | ICD-10-CM | POA: Diagnosis not present

## 2021-06-16 NOTE — Progress Notes (Signed)
Remote pacemaker transmission.   

## 2021-06-22 ENCOUNTER — Telehealth: Payer: Self-pay

## 2021-06-22 NOTE — Telephone Encounter (Signed)
Given persistent afib, we should probably bring him in to see EP APP to discuss anticoagulation.

## 2021-06-22 NOTE — Telephone Encounter (Signed)
Spoke to the patient's daughter [name] and gave advisement. Verbalized agreement. Made appointment with Oda Kilts PA

## 2021-06-22 NOTE — Telephone Encounter (Signed)
"  Repeat alert for AF episode starting 7/18, CVR, burden 65%. No OAC, route to triage as requested. LR"  Successful telephone call to patients daughter Cindra Eves (on [place]) to follow up on ongoing AT/AF episode and patient's s/s. Daughter present with patient at time of call. Patient denies s/s of AT/AF. Denies increased shortness of breath, palpitations, or increase in lower extremity edema. No OAC or rate control medication prescribed. V rates controlled with V pacing. Watchful waiting per Dr. Rayann Heman for previous episodes. Will re-address since AT/AF ongoing for 7 days. Daughter advised that she/patient will be notified if change in episode management. Appreciative of call.

## 2021-06-25 ENCOUNTER — Encounter: Payer: Self-pay | Admitting: Family Medicine

## 2021-06-25 ENCOUNTER — Ambulatory Visit (INDEPENDENT_AMBULATORY_CARE_PROVIDER_SITE_OTHER): Payer: Medicare Other | Admitting: Family Medicine

## 2021-06-25 ENCOUNTER — Other Ambulatory Visit: Payer: Self-pay

## 2021-06-25 VITALS — BP 106/72 | HR 88 | Temp 98.1°F | Resp 14

## 2021-06-25 DIAGNOSIS — R29898 Other symptoms and signs involving the musculoskeletal system: Secondary | ICD-10-CM

## 2021-06-25 DIAGNOSIS — R6889 Other general symptoms and signs: Secondary | ICD-10-CM | POA: Diagnosis not present

## 2021-06-25 NOTE — Progress Notes (Signed)
Subjective:    Patient ID: [name], male    DOB: 11-03-1919, 87 y.o.   MRN: [id number]  Patient is a 87 year old Caucasian gentleman who is here today with his daughter who is his primary caregiver.  The patient is extremely weak in his leg muscles.  He is essentially confined to a wheelchair.  He has a difficult time even standing to walk.  He is at high fall risk because of this.  Weakness is multifactorial.  Primarily due to advanced age.  However he is still extremely sharp mentally.  He still lives at home independently however his family provides all of his meals and comes over every day to stay with him and then spend the night with him.  They feel that if he were able to walk, his quality of life would be better and he would be stronger. Past Medical History:  Diagnosis Date   Acid reflux    Cancer ([place]) 09/05/13   left neck-non-hodgkins lymphoma   Hypertension    requires diuretic   Hypothyroidism    Malaria    while in the service   Peripheral vascular disease ([place])    slow wound healing on legs   Past Surgical History:  Procedure Laterality Date   AMPUTATION Left 06/25/2015   Procedure: LEFT  HALLUX AMPUTATION,;  Surgeon: Wylene Simmer, MD;  Location: [facility];  Service: Orthopedics;  Laterality: Left;  LOCAL/MAC   CHOLECYSTECTOMY N/A 11/08/2018   Procedure: LAPAROSCOPIC CHOLECYSTECTOMY WITH INTRAOPERATIVE CHOLANGIOGRAM;  Surgeon: [name], MD;  Location: [facility];  Service: General;  Laterality: N/A;   MASS BIOPSY Left 09/05/2013   Procedure: EXCISIONAL BIOPSY OF LEFT NECK MASS;  Surgeon: Jerrell Belfast, MD;  Location: [facility];  Service: ENT;  Laterality: Left;   PACEMAKER IMPLANT N/A 05/28/2020   Procedure: PACEMAKER IMPLANT;  Surgeon: Thompson Grayer, MD;  Location: [facility];  Service: Cardiovascular;  Laterality: N/A;   TONSILLECTOMY     YAG LASER APPLICATION Right 02/24/5187   Procedure: YAG LASER APPLICATION;  Surgeon: [name], MD;   Location: [facility];  Service: Ophthalmology;  Laterality: Right;   Current Outpatient Medications on File Prior to Visit  Medication Sig Dispense Refill   amLODipine (NORVASC) 5 MG tablet TAKE 1 TABLET BY MOUTH  DAILY 90 tablet 3   aspirin EC 81 MG tablet Take 1 tablet (81 mg total) by mouth daily.     betamethasone dipropionate 0.05 % cream Apply topically 2 (two) times daily. Apply small amount to red areas on legs twice daily. 45 g 1   clotrimazole (LOTRIMIN) 1 % cream Apply 1 application topically 2 (two) times daily. Apply to external ear for 1 week 30 g 0   enalapril (VASOTEC) 20 MG tablet TAKE 1 TABLET BY MOUTH  DAILY 90 tablet 3   escitalopram (LEXAPRO) 10 MG tablet TAKE 1 TABLET BY MOUTH EVERY DAY (Patient not taking: Reported on 03/25/2021) 90 tablet 2   furosemide (LASIX) 20 MG tablet TAKE 2 TABLETS BY MOUTH  DAILY 180 tablet 3   levothyroxine (SYNTHROID) 112 MCG tablet TAKE 1 TABLET BY MOUTH  DAILY 90 tablet 3   neomycin-polymyxin-hydrocortisone (CORTISPORIN) OTIC solution Place 3 drops into the left ear 4 (four) times daily. For 5 days 10 mL 0   NONFORMULARY OR COMPOUNDED ITEM See pharmacy note 120 each 2   pregabalin (LYRICA) 25 MG capsule 1 in AM, 2 in PM 270 capsule 2   triamcinolone (KENALOG) 0.1 % Apply  1 application topically 3 (three) times daily. 30 g 1   vitamin B-12 (CYANOCOBALAMIN) 1000 MCG tablet Take 1 tablet (1,000 mcg total) by mouth daily. 30 tablet 5   [DISCONTINUED] famotidine (PEPCID) 20 MG tablet Take 20 mg by mouth 2 (two) times daily.     No current facility-administered medications on file prior to visit.   Allergies  Allergen Reactions   Codeine Nausea And Vomiting   Percocet [Oxycodone-Acetaminophen] Nausea And Vomiting   Social History   Socioeconomic History   Marital status: Widowed    Spouse name: Not on file   Number of children: 4   Years of education: Not on file   Highest education level: Not on file  Occupational History   Occupation:  retired  Tobacco Use   Smoking status: Former    Types: Cigars   Smokeless tobacco: Former    Quit date: 10/05/1979  Substance and Sexual Activity   Alcohol use: No   Drug use: No   Sexual activity: Not on file  Other Topics Concern   Not on file  Social History Narrative   Not on file   Social Determinants of Health   Financial Resource Strain: Low Risk    Difficulty of Paying Living Expenses: Not very hard  Food Insecurity: Not on file  Transportation Needs: Not on file  Physical Activity: Not on file  Stress: Not on file  Social Connections: Not on file  Intimate Partner Violence: Not on file      Review of Systems  Neurological:  Positive for dizziness.  All other systems reviewed and are negative.      Objective:   Physical Exam Vitals reviewed.  Constitutional:      General: He is not in acute distress.    Appearance: He is well-developed. He is not diaphoretic.  HENT:     Right Ear: External ear normal.     Left Ear: External ear normal.     Mouth/Throat:     Pharynx: No oropharyngeal exudate.  Cardiovascular:     Rate and Rhythm: Normal rate and regular rhythm.     Heart sounds: Normal heart sounds. No murmur heard.   No friction rub. No gallop.  Pulmonary:     Effort: Pulmonary effort is normal. No respiratory distress.     Breath sounds: Normal breath sounds. No wheezing or rales.  Chest:     Chest wall: No tenderness.  Abdominal:     General: Bowel sounds are normal. There is no distension.     Palpations: Abdomen is soft.     Tenderness: There is no abdominal tenderness. There is no guarding or rebound.  Musculoskeletal:     Lumbar back: No spasms, tenderness or bony tenderness. Normal range of motion.     Right hip: No tenderness. Normal range of motion. Decreased strength.     Left hip: No tenderness. Normal range of motion. Decreased strength.          Assessment & Plan:  Weakness of both legs - Plan: CBC with Differential/Platelet,  COMPLETE METABOLIC PANEL WITH GFR, TSH, Vitamin B12, Ambulatory referral to Home Health Patient last had physical therapy about 18 months ago and he benefited greatly from this.  Family would like to start this again and I believe that is entirely appropriate.  Patient is still very sharp mentally and is still at home.  I believe that if he were ambulatory around the home, he would be less anxious, less depressed, and have a  better quality of life.  I will also check lab work to rule out other potential causes of fatigue including a CBC, TSH, B12, and CMP.

## 2021-06-26 LAB — COMPLETE METABOLIC PANEL WITH GFR
AG Ratio: 1.6 (calc) (ref 1.0–2.5)
ALT: 10 U/L (ref 9–46)
AST: 17 U/L (ref 10–35)
Albumin: 4.4 g/dL (ref 3.6–5.1)
Alkaline phosphatase (APISO): 68 U/L (ref 35–144)
BUN/Creatinine Ratio: 24 (calc) — ABNORMAL HIGH (ref 6–22)
BUN: 38 mg/dL — ABNORMAL HIGH (ref 7–25)
CO2: 24 mmol/L (ref 20–32)
Calcium: 9.8 mg/dL (ref 8.6–10.3)
Chloride: 108 mmol/L (ref 98–110)
Creat: 1.6 mg/dL — ABNORMAL HIGH (ref 0.70–1.22)
Globulin: 2.8 g/dL (calc) (ref 1.9–3.7)
Glucose, Bld: 103 mg/dL — ABNORMAL HIGH (ref 65–99)
Potassium: 5.4 mmol/L — ABNORMAL HIGH (ref 3.5–5.3)
Sodium: 143 mmol/L (ref 135–146)
Total Bilirubin: 0.4 mg/dL (ref 0.2–1.2)
Total Protein: 7.2 g/dL (ref 6.1–8.1)
eGFR: 38 mL/min/{1.73_m2} — ABNORMAL LOW (ref >=60)

## 2021-06-26 LAB — CBC WITH DIFFERENTIAL/PLATELET
Absolute Monocytes: 687 cells/uL (ref 200–950)
Basophils Absolute: 41 cells/uL (ref 0–200)
Basophils Relative: 0.6 %
Eosinophils Absolute: 129 cells/uL (ref 15–500)
Eosinophils Relative: 1.9 %
HCT: 38.5 % (ref 38.5–50.0)
Hemoglobin: 12.8 g/dL — ABNORMAL LOW (ref 13.2–17.1)
Lymphs Abs: 2632 cells/uL (ref 850–3900)
MCH: 31 pg (ref 27.0–33.0)
MCHC: 33.2 g/dL (ref 32.0–36.0)
MCV: 93.2 fL (ref 80.0–100.0)
MPV: 11 fL (ref 7.5–12.5)
Monocytes Relative: 10.1 %
Neutro Abs: 3312 cells/uL (ref 1500–7800)
Neutrophils Relative %: 48.7 %
Platelets: 151 10*3/uL (ref 140–400)
RBC: 4.13 10*6/uL — ABNORMAL LOW (ref 4.20–5.80)
RDW: 12.4 % (ref 11.0–15.0)
Total Lymphocyte: 38.7 %
WBC: 6.8 10*3/uL (ref 3.8–10.8)

## 2021-06-26 LAB — TSH: TSH: 0.42 mIU/L (ref 0.40–4.50)

## 2021-06-26 LAB — VITAMIN B12: Vitamin B-12: 576 pg/mL (ref 200–1100)

## 2021-07-06 NOTE — Progress Notes (Signed)
Electrophysiology Office Note Date: 07/07/2021  ID:  [name], DOB 07/20/1919, MRN [id number]  PCP: Susy Frizzle, MD Primary Cardiologist: Buford Dresser, MD Electrophysiologist: Thompson Grayer, MD   CC: Pacemaker follow-up  [name] is a 87 y.o. male seen today for Thompson Grayer, MD for acute visit due to new persistent atrial fibrillation.  .  Since discharge from hospital the patient reports doing H.  he denies chest pain, palpitations, dyspnea, PND, orthopnea, nausea, vomiting, dizziness, syncope, edema, weight gain, or early satiety.  Device History: St. Jude Dual Chamber PPM implanted 2021 for symptomatic second degree AV block  Past Medical History:  Diagnosis Date   Acid reflux    Cancer ([place]) 09/05/13   left neck-non-hodgkins lymphoma   Hypertension    requires diuretic   Hypothyroidism    Malaria    while in the service   Peripheral vascular disease ([place])    slow wound healing on legs   Past Surgical History:  Procedure Laterality Date   AMPUTATION Left 06/25/2015   Procedure: LEFT  HALLUX AMPUTATION,;  Surgeon: Wylene Simmer, MD;  Location: [facility];  Service: Orthopedics;  Laterality: Left;  LOCAL/MAC   CHOLECYSTECTOMY N/A 11/08/2018   Procedure: LAPAROSCOPIC CHOLECYSTECTOMY WITH INTRAOPERATIVE CHOLANGIOGRAM;  Surgeon: [name], MD;  [facility];  Service: General;  Laterality: N/A;   MASS BIOPSY Left 09/05/2013   Procedure: EXCISIONAL BIOPSY OF LEFT NECK MASS;  Surgeon: Jerrell Belfast, MD;  Location: [facility];  Service: ENT;  Laterality: Left;   PACEMAKER IMPLANT N/A 05/28/2020   Procedure: PACEMAKER IMPLANT;  Surgeon: Thompson Grayer, MD;  Location: [facility];  Service: Cardiovascular;  Laterality: N/A;   TONSILLECTOMY     YAG LASER APPLICATION Right 5/78/4696   Procedure: YAG LASER APPLICATION;  Surgeon: [name], MD;  [facility];  Service: Ophthalmology;  Laterality: Right;    Current Outpatient  Medications  Medication Sig Dispense Refill   amLODipine (NORVASC) 5 MG tablet TAKE 1 TABLET BY MOUTH  DAILY 90 tablet 3   betamethasone dipropionate 0.05 % cream Apply topically 2 (two) times daily. Apply small amount to red areas on legs twice daily. 45 g 1   diphenhydramine-acetaminophen (TYLENOL PM) 25-500 MG TABS tablet Take 1 tablet by mouth at bedtime as needed.     enalapril (VASOTEC) 20 MG tablet TAKE 1 TABLET BY MOUTH  DAILY 90 tablet 3   escitalopram (LEXAPRO) 10 MG tablet TAKE 1 TABLET BY MOUTH EVERY DAY 90 tablet 2   furosemide (LASIX) 20 MG tablet TAKE 2 TABLETS BY MOUTH  DAILY 180 tablet 3   levothyroxine (SYNTHROID) 112 MCG tablet TAKE 1 TABLET BY MOUTH  DAILY 90 tablet 3   NONFORMULARY OR COMPOUNDED ITEM See pharmacy note (Patient taking differently: as needed. Compound Mixture for feet) 120 each 2   Rivaroxaban (XARELTO) 15 MG TABS tablet Take 1 tablet (15 mg total) by mouth daily with supper. 90 tablet 3   triamcinolone (KENALOG) 0.1 % Apply 1 application topically 3 (three) times daily. 30 g 1   vitamin B-12 (CYANOCOBALAMIN) 1000 MCG tablet Take 1 tablet (1,000 mcg total) by mouth daily. 30 tablet 5   No current facility-administered medications for this visit.    Allergies:   Codeine and Percocet [oxycodone-acetaminophen]   Social History: Social History   Socioeconomic History   Marital status: Widowed    Spouse name: Not on file   Number of children: 4   Years of  education: Not on file   Highest education level: Not on file  Occupational History   Occupation: retired  Tobacco Use   Smoking status: Former    Types: Cigars   Smokeless tobacco: Former    Quit date: 10/05/1979  Substance and Sexual Activity   Alcohol use: No   Drug use: No   Sexual activity: Not on file  Other Topics Concern   Not on file  Social History Narrative   Not on file   Social Determinants of Health   Financial Resource Strain: Low Risk    Difficulty of Paying Living  Expenses: Not very hard  Food Insecurity: Not on file  Transportation Needs: Not on file  Physical Activity: Not on file  Stress: Not on file  Social Connections: Not on file  Intimate Partner Violence: Not on file    Family History: Family History  Problem Relation Age of Onset   Lung cancer Brother    Heart disease Brother    Stroke Brother      Review of Systems: All other systems reviewed and are otherwise negative except as noted above.  Physical Exam: Vitals:   07/07/21 1157  BP: 110/68  Pulse: 62  SpO2: 100%  Weight: 230 lb (104.3 kg)  Height: 6' (1.829 m)     GEN- The patient is well appearing, alert and oriented x 3 today.   HEENT: normocephalic, atraumatic; sclera clear, conjunctiva pink; hearing intact; oropharynx clear; neck supple  Lungs- Clear to ausculation bilaterally, normal work of breathing.  No wheezes, rales, rhonchi Heart- Regular rate and rhythm, no murmurs, rubs or gallops  GI- soft, non-tender, non-distended, bowel sounds present  Extremities- no clubbing or cyanosis. No edema MS- no significant deformity or atrophy Skin- warm and dry, no rash or lesion; PPM pocket well healed Psych- euthymic mood, full affect Neuro- strength and sensation are intact  PPM Interrogation- reviewed in detail today,  See PACEART report  EKG:  EKG is not ordered today.  Recent Labs: 06/25/2021: ALT 10; BUN 38; Creat 1.60; Hemoglobin 12.8; Platelets 151; Potassium 5.4; Sodium 143; TSH 0.42   Wt Readings from Last 3 Encounters:  07/07/21 230 lb (104.3 kg)  02/26/21 191 lb 6.4 oz (86.8 kg)  12/02/20 180 lb (81.6 kg)     Other studies Reviewed: Additional studies/ records that were reviewed today include: Previous EP office notes, Previous remote checks, Most recent labwork.   Assessment and Plan:  1. Symptomatic bradycardia with transient CHB s/p St. Jude PPM  Normal PPM function See Pace Art report No changes today  2. Persistent atrial  fibrillation Short episodes starting in June.  Persistent since 06/24/21.  Poor candidate for NSR. Not candidate for EP procedures. I will discuss with Dr. Rayann Heman but think rate control will be best.  Estimated Creatinine Clearance: 31.4 mL/min (A) (by C-G formula based on SCr of 1.6 mg/dL (H)).  With advanced age, will opt to put on Xarelto 15 mg daily given CrCl. His Eliquis dose would fluctuate based on his renal function.   Current medicines are reviewed at length with the patient today.   The patient does not have concerns regarding his medicines.  The following changes were made today:  Start Xarelto. Recent CBC and BMET stable. Repeat 10 days.   Labs/ tests ordered today include:  Orders Placed This Encounter  Procedures   Basic metabolic panel   CBC   Disposition:   Follow up with EP APP in 4 Weeks. If he is  tolerating poorly, could consider offering Latimer at that time, but will try to follow conservatively with rate control. Family in agreement. Discussed above with Dr. Rayann Heman who agrees.   Jacalyn Lefevre, PA-C  07/07/2021 12:29 PM  [place] [address] [phone] (office) [phone] (fax)

## 2021-07-07 ENCOUNTER — Ambulatory Visit (INDEPENDENT_AMBULATORY_CARE_PROVIDER_SITE_OTHER): Payer: Medicare Other | Admitting: Student

## 2021-07-07 ENCOUNTER — Encounter: Payer: Self-pay | Admitting: Student

## 2021-07-07 ENCOUNTER — Other Ambulatory Visit: Payer: Self-pay

## 2021-07-07 VITALS — BP 110/68 | HR 62 | Ht 72.0 in | Wt 230.0 lb

## 2021-07-07 DIAGNOSIS — I10 Essential (primary) hypertension: Secondary | ICD-10-CM

## 2021-07-07 DIAGNOSIS — I4819 Other persistent atrial fibrillation: Secondary | ICD-10-CM | POA: Diagnosis not present

## 2021-07-07 DIAGNOSIS — I442 Atrioventricular block, complete: Secondary | ICD-10-CM

## 2021-07-07 LAB — CUP PACEART INCLINIC DEVICE CHECK
Battery Remaining Longevity: 114 mo
Battery Voltage: 3.01 V
Brady Statistic RA Percent Paced: 0.34 %
Brady Statistic RV Percent Paced: 98 %
Date Time Interrogation Session: 20220810122750
Implantable Lead Implant Date: 20210701
Implantable Lead Implant Date: 20210701
Implantable Lead Location: 753859
Implantable Lead Location: 753860
Implantable Pulse Generator Implant Date: 20210701
Lead Channel Impedance Value: 437.5 Ohm
Lead Channel Impedance Value: 512.5 Ohm
Lead Channel Pacing Threshold Amplitude: 0.625 V
Lead Channel Pacing Threshold Amplitude: 1.875 V
Lead Channel Pacing Threshold Pulse Width: 0.5 ms
Lead Channel Pacing Threshold Pulse Width: 0.5 ms
Lead Channel Sensing Intrinsic Amplitude: 1.9 mV
Lead Channel Sensing Intrinsic Amplitude: 8.7 mV
Lead Channel Setting Pacing Amplitude: 0.875
Lead Channel Setting Pacing Amplitude: 2.5 V
Lead Channel Setting Pacing Pulse Width: 0.5 ms
Lead Channel Setting Sensing Sensitivity: 2 mV
Pulse Gen Model: 2272
Pulse Gen Serial Number: 3842377

## 2021-07-07 MED ORDER — RIVAROXABAN 15 MG PO TABS: 15.0000 mg | ORAL_TABLET | Freq: Every day | ORAL | 3 refills | Status: DC

## 2021-07-07 NOTE — Patient Instructions (Signed)
Medication Instructions:  Your physician has recommended you make the following change in your medication:   START: Xarelto 15mg  daily STOP: Aspirin  *If you need a refill on your cardiac medications before your next appointment, please call your pharmacy*   Lab Work: BMET, CBC on 07/19/2021  If you have labs (blood work) drawn today and your tests are completely normal, you will receive your results only by: [place] (if you have MyChart) OR A paper copy in the mail If you have any lab test that is abnormal or we need to change your treatment, we will call you to review the results.   Follow-Up: At [facility], you and your health needs are our priority.  As part of our continuing mission to provide you with exceptional heart care, we have created designated Provider Care Teams.  These Care Teams include your primary Cardiologist (physician) and Advanced Practice Providers (APPs -  Physician Assistants and Nurse Practitioners) who all work together to provide you with the care you need, when you need it.  Your next appointment:   08/09/2021 with Oda Kilts, PA

## 2021-07-09 ENCOUNTER — Telehealth: Payer: Self-pay | Admitting: *Deleted

## 2021-07-09 ENCOUNTER — Other Ambulatory Visit: Payer: Medicare Other

## 2021-07-09 ENCOUNTER — Other Ambulatory Visit: Payer: Self-pay

## 2021-07-09 ENCOUNTER — Other Ambulatory Visit: Payer: Self-pay | Admitting: *Deleted

## 2021-07-09 ENCOUNTER — Telehealth: Payer: Self-pay | Admitting: Student

## 2021-07-09 DIAGNOSIS — R319 Hematuria, unspecified: Secondary | ICD-10-CM | POA: Diagnosis not present

## 2021-07-09 LAB — URINALYSIS, ROUTINE W REFLEX MICROSCOPIC
Bacteria, UA: NONE SEEN /HPF
Glucose, UA: NEGATIVE
Hyaline Cast: NONE SEEN /LPF
Leukocytes,Ua: NEGATIVE
Nitrite: NEGATIVE
RBC / HPF: >=60 /HPF — AB (ref 0–2)
Specific Gravity, Urine: 1.02 (ref 1.001–1.035)
Squamous Epithelial / HPF: NONE SEEN /HPF (ref <=5)
WBC, UA: NONE SEEN /HPF (ref 0–5)
pH: 5.5 (ref 5.0–8.0)

## 2021-07-09 LAB — MICROSCOPIC MESSAGE

## 2021-07-09 NOTE — Telephone Encounter (Signed)
Pt c/o medication issue:  1. Name of Medication:  Rivaroxaban (XARELTO) 15 MG TABS tablet  2. How are you currently taking this medication (dosage and times per day)? 1 tablet by mouth daily in the AM. Has not taken today   3. Are you having a reaction (difficulty breathing--STAT)? Yes  4. What is your medication issue? [name] is calling stating Artice started this medication yesterday and then noticed he was having blood in his urine both yesterday and today. [name] has taken a sample that he is taking up to his PCP today to be tested. He is wanting to know if this could be due to this medication. He is not taking it at night as advised on prescription, but in the AM. Curly has not taking the medication today. Pt. does not report any burning when urinating. Please advise.

## 2021-07-09 NOTE — Telephone Encounter (Signed)
Patient son in office. Reports that patient is having blood in urine.    Advised to bring sample to office for UA C&S. Orders placed.

## 2021-07-09 NOTE — Telephone Encounter (Signed)
I see that he is following closely with his PCP (today). He should hold the Xarelto in the meantime.   Does he have any history of hematuria?  Legrand Como [address]" [place], PA-C  07/09/2021 11:41 AM

## 2021-07-10 LAB — URINE CULTURE
MICRO NUMBER:: 12236306
Result:: NO GROWTH
SPECIMEN QUALITY:: ADEQUATE

## 2021-07-13 ENCOUNTER — Telehealth: Payer: Self-pay | Admitting: Student

## 2021-07-13 ENCOUNTER — Other Ambulatory Visit: Payer: Self-pay

## 2021-07-13 ENCOUNTER — Ambulatory Visit: Payer: Medicare Other | Admitting: Podiatry

## 2021-07-13 DIAGNOSIS — I739 Peripheral vascular disease, unspecified: Secondary | ICD-10-CM

## 2021-07-13 DIAGNOSIS — R601 Generalized edema: Secondary | ICD-10-CM | POA: Diagnosis not present

## 2021-07-13 DIAGNOSIS — E8770 Fluid overload, unspecified: Secondary | ICD-10-CM

## 2021-07-13 NOTE — Progress Notes (Signed)
Subjective:  Patient ID: [name], male    DOB: Aug 16, 1919,  MRN: [id number]  Chief Complaint  Patient presents with   Wound Check    Left foot wound check     87 y.o. male presents with the above complaint.  Patient presents with complaint and follow-up of edema with redness.  Patient states the redness has improved considerably.  The doxycycline helped.  He still has some swelling.  For which they are working on decreasing it.  Denies any other acute complaints   Review of Systems: Negative except as noted in the HPI. Denies N/V/F/Ch.  Past Medical History:  Diagnosis Date   Acid reflux    Cancer ([place]) 09/05/13   left neck-non-hodgkins lymphoma   Hypertension    requires diuretic   Hypothyroidism    Malaria    while in the service   Peripheral vascular disease ([place])    slow wound healing on legs    Current Outpatient Medications:    amLODipine (NORVASC) 5 MG tablet, TAKE 1 TABLET BY MOUTH  DAILY, Disp: 90 tablet, Rfl: 3   betamethasone dipropionate 0.05 % cream, Apply topically 2 (two) times daily. Apply small amount to red areas on legs twice daily., Disp: 45 g, Rfl: 1   diphenhydramine-acetaminophen (TYLENOL PM) 25-500 MG TABS tablet, Take 1 tablet by mouth at bedtime as needed., Disp: , Rfl:    enalapril (VASOTEC) 20 MG tablet, TAKE 1 TABLET BY MOUTH  DAILY, Disp: 90 tablet, Rfl: 3   escitalopram (LEXAPRO) 10 MG tablet, TAKE 1 TABLET BY MOUTH EVERY DAY, Disp: 90 tablet, Rfl: 2   furosemide (LASIX) 20 MG tablet, TAKE 2 TABLETS BY MOUTH  DAILY, Disp: 180 tablet, Rfl: 3   levothyroxine (SYNTHROID) 112 MCG tablet, TAKE 1 TABLET BY MOUTH  DAILY, Disp: 90 tablet, Rfl: 3   NONFORMULARY OR COMPOUNDED ITEM, See pharmacy note (Patient taking differently: as needed. Compound Mixture for feet), Disp: 120 each, Rfl: 2   Rivaroxaban (XARELTO) 15 MG TABS tablet, Take 1 tablet (15 mg total) by mouth daily with supper., Disp: 90 tablet, Rfl: 3   triamcinolone (KENALOG) 0.1 %, Apply 1  application topically 3 (three) times daily., Disp: 30 g, Rfl: 1   vitamin B-12 (CYANOCOBALAMIN) 1000 MCG tablet, Take 1 tablet (1,000 mcg total) by mouth daily., Disp: 30 tablet, Rfl: 5  Social History   Tobacco Use  Smoking Status Former   Types: Cigars  Smokeless Tobacco Former   Quit date: 10/05/1979    Allergies  Allergen Reactions   Codeine Nausea And Vomiting   Percocet [Oxycodone-Acetaminophen] Nausea And Vomiting   Objective:  There were no vitals filed for this visit. There is no height or weight on file to calculate BMI. Constitutional Well developed. Well nourished.  Vascular Dorsalis pedis pulses palpable bilaterally. Posterior tibial pulses palpable bilaterally. Capillary refill normal to all digits.  No cyanosis or clubbing noted. Pedal hair growth normal.  Neurologic Normal speech. Oriented to person, place, and time. Epicritic sensation to light touch grossly present bilaterally.  Dermatologic Edema noted 2+ pitting to bilateral lower extremity.  No further redness noted to the right side.  No redness noted to the left foot.  No open wounds or lesions noted no clinical signs of infection noted beside erythema.  Orthopedic: Normal joint ROM without pain or crepitus bilaterally. No visible deformities. No bony tenderness.   Radiographs: None Assessment:   1. Generalized edema due to fluid overload   2. Peripheral vascular disease ([place])  Plan:  Patient was evaluated and treated and all questions answered.  Right foot erythema with underlying lymphedema -I explained to the patient the etiology of edema and various treatment options were extensively discussed.  Clinically improved from doxycycline.  At this time I encouraged wearing compression socks and sleeves to help decrease edema.  Patient states understanding. -They would like to hold off on Unna boot compression therapy.  They will continue with Ace wrap compression  No follow-ups on file.

## 2021-07-13 NOTE — Telephone Encounter (Signed)
I spoke with Becky.  She reports on 8/10 they noticed a small amount of bleeding in patient's underwear.  He started Xarelto later that day. Bleeding increased on Friday morning  so patient stopped Xarelto and had urine checked at PCP's office.  Per last phone note patient to hold Xarelto.   Daughter reports urine sample did not show UTI.  Urine has cleared. Patient does not have history of hematuria. They are asking if patient should resume Xarelto.  Also need to know if lab work should be rescheduled as patient has not been taking Xarelto. I advised them to have patient hold Xarelto for now and message would be sent to Oda Kilts, [place] for recommendations

## 2021-07-13 NOTE — Telephone Encounter (Signed)
[name] is aware to continue to hold Xarelto and [name] will review with Dr Rayann Heman tomorrow.  Aware she will be called back with further instructions once reviewed.

## 2021-07-13 NOTE — Telephone Encounter (Signed)
Pt c/o medication issue:  1. Name of Medication: Rivaroxaban (XARELTO) 15 MG TABS tablet  2. How are you currently taking this medication (dosage and times per day)? Take 1 tablet (15 mg total) by mouth daily with supper.  3. Are you having a reaction (difficulty breathing--STAT)? No  4. What is your medication issue? Pt started taking Xarelto on 07/07/21, pt has been passing blood in his urine so pt stopped taking taking this med on 07/10/21, pt's daughter [name] would like to know what she should do moving forward. Please advise Becky at [phone]

## 2021-07-14 NOTE — Telephone Encounter (Signed)
Spoke with [name] and advised her that the patient should restart taking his Xarelto. She confirms that patient is no longer having any urine in his blood and no other bleeding issues. Lab work has been rescheduled for 8/26

## 2021-07-19 ENCOUNTER — Other Ambulatory Visit: Payer: Medicare Other

## 2021-07-19 NOTE — Telephone Encounter (Signed)
Spoke with the patient's daughter who states that they restarted the patient on Xarelto and bleeding returned. She states that he took Xarelto Thursday and Friday. Saturday morning is when they noticed blood in his urine. They have not given him any xarelto since Friday. Advised will let [name] and Dr. Rayann Heman know and will call back with a plan.

## 2021-07-19 NOTE — Telephone Encounter (Signed)
Spoke with the patient's daughter and advised the patient should stay off of xarelto. He states bleeding has resolved but will follow up with PCP if it reoccurs.

## 2021-07-19 NOTE — Telephone Encounter (Signed)
Becky sent this via Mychart message to the [facility] pool:  Forwarding to Triage  [place], Moe T  P [facility] [address] Scheduling (supporting Mychart, Generic) 2 days ago   We gave daddy the Xarelto Thursday night and Friday. Now there is dark blood in his urine. We have stopped the Xarelto again till we hear from you.

## 2021-07-20 DIAGNOSIS — I129 Hypertensive chronic kidney disease with stage 1 through stage 4 chronic kidney disease, or unspecified chronic kidney disease: Secondary | ICD-10-CM | POA: Diagnosis not present

## 2021-07-20 DIAGNOSIS — C8581 Other specified types of non-Hodgkin lymphoma, lymph nodes of head, face, and neck: Secondary | ICD-10-CM | POA: Diagnosis not present

## 2021-07-20 DIAGNOSIS — N183 Chronic kidney disease, stage 3 unspecified: Secondary | ICD-10-CM | POA: Diagnosis not present

## 2021-07-20 DIAGNOSIS — I442 Atrioventricular block, complete: Secondary | ICD-10-CM | POA: Diagnosis not present

## 2021-07-20 DIAGNOSIS — I739 Peripheral vascular disease, unspecified: Secondary | ICD-10-CM | POA: Diagnosis not present

## 2021-07-23 ENCOUNTER — Other Ambulatory Visit: Payer: Medicare Other

## 2021-07-26 DIAGNOSIS — C8581 Other specified types of non-Hodgkin lymphoma, lymph nodes of head, face, and neck: Secondary | ICD-10-CM | POA: Diagnosis not present

## 2021-07-26 DIAGNOSIS — I129 Hypertensive chronic kidney disease with stage 1 through stage 4 chronic kidney disease, or unspecified chronic kidney disease: Secondary | ICD-10-CM | POA: Diagnosis not present

## 2021-07-26 DIAGNOSIS — I739 Peripheral vascular disease, unspecified: Secondary | ICD-10-CM | POA: Diagnosis not present

## 2021-07-26 DIAGNOSIS — N183 Chronic kidney disease, stage 3 unspecified: Secondary | ICD-10-CM | POA: Diagnosis not present

## 2021-07-26 DIAGNOSIS — I442 Atrioventricular block, complete: Secondary | ICD-10-CM | POA: Diagnosis not present

## 2021-07-29 DIAGNOSIS — N183 Chronic kidney disease, stage 3 unspecified: Secondary | ICD-10-CM | POA: Diagnosis not present

## 2021-07-29 DIAGNOSIS — C8581 Other specified types of non-Hodgkin lymphoma, lymph nodes of head, face, and neck: Secondary | ICD-10-CM | POA: Diagnosis not present

## 2021-07-29 DIAGNOSIS — I129 Hypertensive chronic kidney disease with stage 1 through stage 4 chronic kidney disease, or unspecified chronic kidney disease: Secondary | ICD-10-CM | POA: Diagnosis not present

## 2021-07-29 DIAGNOSIS — I442 Atrioventricular block, complete: Secondary | ICD-10-CM | POA: Diagnosis not present

## 2021-07-29 DIAGNOSIS — I739 Peripheral vascular disease, unspecified: Secondary | ICD-10-CM | POA: Diagnosis not present

## 2021-08-02 DIAGNOSIS — C8581 Other specified types of non-Hodgkin lymphoma, lymph nodes of head, face, and neck: Secondary | ICD-10-CM | POA: Diagnosis not present

## 2021-08-02 DIAGNOSIS — I129 Hypertensive chronic kidney disease with stage 1 through stage 4 chronic kidney disease, or unspecified chronic kidney disease: Secondary | ICD-10-CM | POA: Diagnosis not present

## 2021-08-02 DIAGNOSIS — N183 Chronic kidney disease, stage 3 unspecified: Secondary | ICD-10-CM | POA: Diagnosis not present

## 2021-08-02 DIAGNOSIS — I442 Atrioventricular block, complete: Secondary | ICD-10-CM | POA: Diagnosis not present

## 2021-08-02 DIAGNOSIS — I739 Peripheral vascular disease, unspecified: Secondary | ICD-10-CM | POA: Diagnosis not present

## 2021-08-05 DIAGNOSIS — I739 Peripheral vascular disease, unspecified: Secondary | ICD-10-CM | POA: Diagnosis not present

## 2021-08-05 DIAGNOSIS — N183 Chronic kidney disease, stage 3 unspecified: Secondary | ICD-10-CM | POA: Diagnosis not present

## 2021-08-05 DIAGNOSIS — I129 Hypertensive chronic kidney disease with stage 1 through stage 4 chronic kidney disease, or unspecified chronic kidney disease: Secondary | ICD-10-CM | POA: Diagnosis not present

## 2021-08-05 DIAGNOSIS — C8581 Other specified types of non-Hodgkin lymphoma, lymph nodes of head, face, and neck: Secondary | ICD-10-CM | POA: Diagnosis not present

## 2021-08-05 DIAGNOSIS — I442 Atrioventricular block, complete: Secondary | ICD-10-CM | POA: Diagnosis not present

## 2021-08-09 ENCOUNTER — Encounter: Payer: Self-pay | Admitting: Student

## 2021-08-09 ENCOUNTER — Other Ambulatory Visit: Payer: Self-pay

## 2021-08-09 ENCOUNTER — Ambulatory Visit: Payer: Medicare Other | Admitting: Student

## 2021-08-09 VITALS — BP 118/68 | HR 70 | Ht 72.0 in | Wt 230.0 lb

## 2021-08-09 DIAGNOSIS — I4819 Other persistent atrial fibrillation: Secondary | ICD-10-CM | POA: Diagnosis not present

## 2021-08-09 DIAGNOSIS — I10 Essential (primary) hypertension: Secondary | ICD-10-CM | POA: Diagnosis not present

## 2021-08-09 DIAGNOSIS — N183 Chronic kidney disease, stage 3 unspecified: Secondary | ICD-10-CM | POA: Diagnosis not present

## 2021-08-09 DIAGNOSIS — I442 Atrioventricular block, complete: Secondary | ICD-10-CM

## 2021-08-09 DIAGNOSIS — I739 Peripheral vascular disease, unspecified: Secondary | ICD-10-CM | POA: Diagnosis not present

## 2021-08-09 DIAGNOSIS — I129 Hypertensive chronic kidney disease with stage 1 through stage 4 chronic kidney disease, or unspecified chronic kidney disease: Secondary | ICD-10-CM | POA: Diagnosis not present

## 2021-08-09 DIAGNOSIS — C8581 Other specified types of non-Hodgkin lymphoma, lymph nodes of head, face, and neck: Secondary | ICD-10-CM | POA: Diagnosis not present

## 2021-08-09 NOTE — Progress Notes (Signed)
Electrophysiology Office Note Date: 08/09/2021  ID:  [name], DOB September 21, 1919, MRN [id number]  PCP: Susy Frizzle, MD Primary Cardiologist: Buford Dresser, MD Electrophysiologist: Thompson Grayer, MD   CC: Pacemaker follow-up  [name] is a 87 y.o. male seen today for Thompson Grayer, MD for routine electrophysiology follow up.   At last visit, attempted to start on Xarelto for new persistent AF. Unfortunately, he had hematuria shortly after two separate attempts to start. He has remained off and since has not had any further bleeding. He is tolerating low dose ASA.  His daughter is present today. They understand that he is at risk of stroke without means of helping to prevent it. He is at his baseline functional status. [place] and do some fishing. He denies undue SOB or syncope. No edema.   Device History: St. Jude Dual Chamber PPM implanted 2021 for symptomatic second degree AV block  Past Medical History:  Diagnosis Date   Acid reflux    Cancer ([place]) 09/05/13   left neck-non-hodgkins lymphoma   Hypertension    requires diuretic   Hypothyroidism    Malaria    while in the service   Peripheral vascular disease ([place])    slow wound healing on legs   Past Surgical History:  Procedure Laterality Date   AMPUTATION Left 06/25/2015   Procedure: LEFT  HALLUX AMPUTATION,;  Surgeon: Wylene Simmer, MD;  Location: [facility];  Service: Orthopedics;  Laterality: Left;  LOCAL/MAC   CHOLECYSTECTOMY N/A 11/08/2018   Procedure: LAPAROSCOPIC CHOLECYSTECTOMY WITH INTRAOPERATIVE CHOLANGIOGRAM;  Surgeon: [name], MD;  Location: [facility];  Service: General;  Laterality: N/A;   MASS BIOPSY Left 09/05/2013   Procedure: EXCISIONAL BIOPSY OF LEFT NECK MASS;  Surgeon: Jerrell Belfast, MD;  Location: [facility];  Service: ENT;  Laterality: Left;   PACEMAKER IMPLANT N/A 05/28/2020   Procedure: PACEMAKER IMPLANT;  Surgeon:  Thompson Grayer, MD;  Location: [facility];  Service: Cardiovascular;  Laterality: N/A;   TONSILLECTOMY     YAG LASER APPLICATION Right 0/17/4944   Procedure: YAG LASER APPLICATION;  Surgeon: [name], MD;  Location: [facility];  Service: Ophthalmology;  Laterality: Right;    Current Outpatient Medications  Medication Sig Dispense Refill   amLODipine (NORVASC) 5 MG tablet TAKE 1 TABLET BY MOUTH  DAILY 90 tablet 3   aspirin EC 81 MG tablet Take 81 mg by mouth daily. Swallow whole.     betamethasone dipropionate 0.05 % cream Apply topically 2 (two) times daily. Apply small amount to red areas on legs twice daily. 45 g 1   diphenhydramine-acetaminophen (TYLENOL PM) 25-500 MG TABS tablet Take 1 tablet by mouth at bedtime as needed.     enalapril (VASOTEC) 20 MG tablet TAKE 1 TABLET BY MOUTH  DAILY 90 tablet 3   escitalopram (LEXAPRO) 10 MG tablet TAKE 1 TABLET BY MOUTH EVERY DAY 90 tablet 2   furosemide (LASIX) 20 MG tablet TAKE 2 TABLETS BY MOUTH  DAILY 180 tablet 3   levothyroxine (SYNTHROID) 112 MCG tablet TAKE 1 TABLET BY MOUTH  DAILY 90 tablet 3   NONFORMULARY OR COMPOUNDED ITEM See pharmacy note (Patient taking differently: as needed. Compound Mixture for feet) 120 each 2   Rivaroxaban (XARELTO) 15 MG TABS tablet Take 1 tablet (15 mg total) by mouth daily with supper. 90 tablet 3   vitamin B-12 (CYANOCOBALAMIN) 1000 MCG tablet Take  1 tablet (1,000 mcg total) by mouth daily. 30 tablet 5   triamcinolone (KENALOG) 0.1 % Apply 1 application topically 3 (three) times daily. (Patient not taking: Reported on 08/09/2021) 30 g 1   No current facility-administered medications for this visit.    Allergies:   Codeine and Percocet [oxycodone-acetaminophen]   Social History: Social History   Socioeconomic History   Marital status: Widowed    Spouse name: Not on file   Number of children: 4   Years of education: Not on file   Highest education level: Not on file  Occupational History    Occupation: retired  Tobacco Use   Smoking status: Former    Types: Cigars   Smokeless tobacco: Former    Quit date: 10/05/1979  Substance and Sexual Activity   Alcohol use: No   Drug use: No   Sexual activity: Not on file  Other Topics Concern   Not on file  Social History Narrative   Not on file   Social Determinants of Health   Financial Resource Strain: Low Risk    Difficulty of Paying Living Expenses: Not very hard  Food Insecurity: Not on file  Transportation Needs: Not on file  Physical Activity: Not on file  Stress: Not on file  Social Connections: Not on file  Intimate Partner Violence: Not on file    Family History: Family History  Problem Relation Age of Onset   Lung cancer Brother    Heart disease Brother    Stroke Brother    Review of Systems: All other systems reviewed and are otherwise negative except as noted above.  Physical Exam: Vitals:   08/09/21 1159  BP: 118/68  Pulse: 70  SpO2: 100%  Weight: 230 lb (104.3 kg)  Height: 6' (1.829 m)    GEN- The patient is elderly appearing, alert and oriented x 3 today.   HEENT: normocephalic, atraumatic; sclera clear, conjunctiva pink; hearing intact; oropharynx clear; neck supple  Lungs- Clear to ausculation bilaterally, normal work of breathing.  No wheezes, rales, rhonchi Heart- Regular rate and rhythm, no murmurs, rubs or gallops  GI- soft, non-tender, non-distended, bowel sounds present  Extremities- no clubbing or cyanosis. No edema MS- no significant deformity or atrophy Skin- warm and dry, no rash or lesion; PPM pocket well healed Psych- euthymic mood, full affect Neuro- strength and sensation are intact  PPM Interrogation- Not interrogated today.   EKG:  EKG is ordered today. EKG shows AF vs AFL with V pacing at 70 bpm  Recent Labs: 06/25/2021: ALT 10; BUN 38; Creat 1.60; Hemoglobin 12.8; Platelets 151; Potassium 5.4; Sodium 143; TSH 0.42   Wt Readings from Last 3 Encounters:  08/09/21 230  lb (104.3 kg)  07/07/21 230 lb (104.3 kg)  02/26/21 191 lb 6.4 oz (86.8 kg)     Other studies Reviewed: Additional studies/ records that were reviewed today include: Previous EP office notes, Previous remote checks, Most recent labwork.   Assessment and Plan:  1. Symptomatic bradycardia with transient CHB s/p St. Jude PPM  Normal PPM function See Claudia Desanctis Art report from last visit. No changes   2. Persistent atrial fibrillation Short episodes starting in June. Then persistence since end of July. He is out of rhythm today and not candidate for rhythm control given intolerance of [place] with gross hematuria.  CHA2DS2/VASc is at least 4.  His rates are controlled when out of rhythm.  3. Hematuria This has resolved off Xarelto.  Follow up with PCP if further off  [place].   4. Goals of Care He understands with a risk of stroke and inability to use   Disposition:   Follow up with Dr. Rayann Heman in 6 months after shared decision making today. They understand to call back if he has any issues in the meantime.   Weeks. If he is tolerating poorly, could consider offering [facility] at that time, but will try to follow conservatively with rate control. Family in agreement. Discussed above with Dr. Rayann Heman who agrees.   Jacalyn Lefevre, PA-C  08/09/2021 12:45 PM  [place] [address] [phone] (office) [phone] (fax)

## 2021-08-09 NOTE — Patient Instructions (Signed)
Medication Instructions:  Your physician recommends that you continue on your current medications as directed. Please refer to the Current Medication list given to you today.  *If you need a refill on your cardiac medications before your next appointment, please call your pharmacy*   Lab Work: None If you have labs (blood work) drawn today and your tests are completely normal, you will receive your results only by: MyChart Message (if you have MyChart) OR A paper copy in the mail If you have any lab test that is abnormal or we need to change your treatment, we will call you to review the results.   Follow-Up: At [facility], you and your health needs are our priority.  As part of our continuing mission to provide you with exceptional heart care, we have created designated Provider Care Teams.  These Care Teams include your primary Cardiologist (physician) and Advanced Practice Providers (APPs -  Physician Assistants and Nurse Practitioners) who all work together to provide you with the care you need, when you need it.   Your next appointment:   6 month(s)  The format for your next appointment:   In Person  Provider:   You may see [name], MD or one of the following Advanced Practice Providers on your designated Care Team:   Renee Ursuy, PA-C Michael "Andy" Tillery, PA-C    

## 2021-08-12 ENCOUNTER — Telehealth: Payer: Self-pay | Admitting: Pharmacist

## 2021-08-12 DIAGNOSIS — N183 Chronic kidney disease, stage 3 unspecified: Secondary | ICD-10-CM | POA: Diagnosis not present

## 2021-08-12 DIAGNOSIS — I129 Hypertensive chronic kidney disease with stage 1 through stage 4 chronic kidney disease, or unspecified chronic kidney disease: Secondary | ICD-10-CM | POA: Diagnosis not present

## 2021-08-12 DIAGNOSIS — C8581 Other specified types of non-Hodgkin lymphoma, lymph nodes of head, face, and neck: Secondary | ICD-10-CM | POA: Diagnosis not present

## 2021-08-12 DIAGNOSIS — I739 Peripheral vascular disease, unspecified: Secondary | ICD-10-CM | POA: Diagnosis not present

## 2021-08-12 DIAGNOSIS — I442 Atrioventricular block, complete: Secondary | ICD-10-CM | POA: Diagnosis not present

## 2021-08-12 NOTE — Progress Notes (Addendum)
    Chronic Care Management Pharmacy Assistant   Name: [name]  MRN: [id number] DOB: 1919/10/01  Reason for Encounter: General Disease State Call   Conditions to be addressed/monitored: HTN, Acid Reflux, Hypothryoidism, CKD, Insomnia  Recent office visits:  06/25/21 Dr. Dennard Schaumann For weakness of both legs. STOPPED Clotrimazole, Neomycin, and Pregabalin.   Recent consult visits:  08/09/21 Cardiology Tillery, Runell Gess, PA-C. For heart block AV complete. No medication changes.  07/13/21 Podiatry Felipa Furnace, DPM. For edema/Wound check. No medication changes. 07/09/21 (Telephone) Cardiology Tillery, Runell Gess, PA-C. Per note: Blood in the urine so the doctor stated hold the Rivaroxaban  in the meantime. 07/07/21 Cardiology Tillery, Runell Gess, PA-C. For heart block AV complete. STARTED Rivaroxaban 15 mg daily. STOPPED Aspirin and Famotidine.   Hospital visits:  None since 06/10/21  Medications: Outpatient Encounter Medications as of 08/12/2021  Medication Sig   amLODipine (NORVASC) 5 MG tablet TAKE 1 TABLET BY MOUTH  DAILY   aspirin EC 81 MG tablet Take 81 mg by mouth daily. Swallow whole.   betamethasone dipropionate 0.05 % cream Apply topically 2 (two) times daily. Apply small amount to red areas on legs twice daily.   diphenhydramine-acetaminophen (TYLENOL PM) 25-500 MG TABS tablet Take 1 tablet by mouth at bedtime as needed.   enalapril (VASOTEC) 20 MG tablet TAKE 1 TABLET BY MOUTH  DAILY   escitalopram (LEXAPRO) 10 MG tablet TAKE 1 TABLET BY MOUTH EVERY DAY   furosemide (LASIX) 20 MG tablet TAKE 2 TABLETS BY MOUTH  DAILY   levothyroxine (SYNTHROID) 112 MCG tablet TAKE 1 TABLET BY MOUTH  DAILY   NONFORMULARY OR COMPOUNDED ITEM See pharmacy note (Patient taking differently: as needed. Compound Mixture for feet)   Rivaroxaban (XARELTO) 15 MG TABS tablet Take 1 tablet (15 mg total) by mouth daily with supper.   triamcinolone (KENALOG) 0.1 % Apply 1 application  topically 3 (three) times daily. (Patient not taking: Reported on 08/09/2021)   vitamin B-12 (CYANOCOBALAMIN) 1000 MCG tablet Take 1 tablet (1,000 mcg total) by mouth daily.   No facility-administered encounter medications on file as of 08/12/2021.   GEN CALL: Patient stated his right foot has been hurting a little bit he stated he's been elevating both of them. He stated he eats well. Patient stated he does physical therapy twice a week. His daughter stated he sees a eye specialist and is not due back till January.   Care Gaps:Patient is overdue on his AWV, eye exam (Jan) and he is overdue for his HBA1C.   Star Rating Drugs:Enalapril 20 mg 06/30/21 90 DS.   Follow-Up:Pharmacist Review Charlann Lange, RMA Clinical Pharmacist Assistant [phone]  6 minutes spent in review, coordination, and documentation.  Called patient to discuss scheduling wellness visit, patient's daughter to call back and schedule when convenient.  Reviewed by: Beverly Milch, PharmD Clinical Pharmacist [phone]

## 2021-08-16 DIAGNOSIS — I129 Hypertensive chronic kidney disease with stage 1 through stage 4 chronic kidney disease, or unspecified chronic kidney disease: Secondary | ICD-10-CM | POA: Diagnosis not present

## 2021-08-16 DIAGNOSIS — I739 Peripheral vascular disease, unspecified: Secondary | ICD-10-CM | POA: Diagnosis not present

## 2021-08-16 DIAGNOSIS — C8581 Other specified types of non-Hodgkin lymphoma, lymph nodes of head, face, and neck: Secondary | ICD-10-CM | POA: Diagnosis not present

## 2021-08-16 DIAGNOSIS — I442 Atrioventricular block, complete: Secondary | ICD-10-CM | POA: Diagnosis not present

## 2021-08-16 DIAGNOSIS — N183 Chronic kidney disease, stage 3 unspecified: Secondary | ICD-10-CM | POA: Diagnosis not present

## 2021-08-20 DIAGNOSIS — N183 Chronic kidney disease, stage 3 unspecified: Secondary | ICD-10-CM | POA: Diagnosis not present

## 2021-08-20 DIAGNOSIS — C8581 Other specified types of non-Hodgkin lymphoma, lymph nodes of head, face, and neck: Secondary | ICD-10-CM | POA: Diagnosis not present

## 2021-08-20 DIAGNOSIS — I129 Hypertensive chronic kidney disease with stage 1 through stage 4 chronic kidney disease, or unspecified chronic kidney disease: Secondary | ICD-10-CM | POA: Diagnosis not present

## 2021-08-20 DIAGNOSIS — I442 Atrioventricular block, complete: Secondary | ICD-10-CM | POA: Diagnosis not present

## 2021-08-20 DIAGNOSIS — I739 Peripheral vascular disease, unspecified: Secondary | ICD-10-CM | POA: Diagnosis not present

## 2021-08-25 DIAGNOSIS — I129 Hypertensive chronic kidney disease with stage 1 through stage 4 chronic kidney disease, or unspecified chronic kidney disease: Secondary | ICD-10-CM | POA: Diagnosis not present

## 2021-08-25 DIAGNOSIS — N183 Chronic kidney disease, stage 3 unspecified: Secondary | ICD-10-CM | POA: Diagnosis not present

## 2021-08-25 DIAGNOSIS — C8581 Other specified types of non-Hodgkin lymphoma, lymph nodes of head, face, and neck: Secondary | ICD-10-CM | POA: Diagnosis not present

## 2021-08-25 DIAGNOSIS — I739 Peripheral vascular disease, unspecified: Secondary | ICD-10-CM | POA: Diagnosis not present

## 2021-08-25 DIAGNOSIS — I442 Atrioventricular block, complete: Secondary | ICD-10-CM | POA: Diagnosis not present

## 2021-08-27 ENCOUNTER — Ambulatory Visit (INDEPENDENT_AMBULATORY_CARE_PROVIDER_SITE_OTHER): Payer: Medicare Other

## 2021-08-27 DIAGNOSIS — I442 Atrioventricular block, complete: Secondary | ICD-10-CM

## 2021-08-27 LAB — CUP PACEART REMOTE DEVICE CHECK
Battery Remaining Longevity: 109 mo
Battery Remaining Percentage: 86 %
Battery Voltage: 3.01 V
Brady Statistic AP VP Percent: 0 %
Brady Statistic AP VS Percent: 0 %
Brady Statistic AS VP Percent: 0 %
Brady Statistic AS VS Percent: 0 %
Brady Statistic RA Percent Paced: 0 %
Brady Statistic RV Percent Paced: 98 %
Date Time Interrogation Session: 20220930020014
Implantable Lead Implant Date: 20210701
Implantable Lead Implant Date: 20210701
Implantable Lead Location: 753859
Implantable Lead Location: 753860
Implantable Pulse Generator Implant Date: 20210701
Lead Channel Impedance Value: 440 Ohm
Lead Channel Impedance Value: 530 Ohm
Lead Channel Pacing Threshold Amplitude: 0.625 V
Lead Channel Pacing Threshold Amplitude: 1.25 V
Lead Channel Pacing Threshold Pulse Width: 0.5 ms
Lead Channel Pacing Threshold Pulse Width: 0.5 ms
Lead Channel Sensing Intrinsic Amplitude: 1.9 mV
Lead Channel Sensing Intrinsic Amplitude: 7.9 mV
Lead Channel Setting Pacing Amplitude: 0.875
Lead Channel Setting Pacing Amplitude: 2.5 V
Lead Channel Setting Pacing Pulse Width: 0.5 ms
Lead Channel Setting Sensing Sensitivity: 2 mV
Pulse Gen Model: 2272
Pulse Gen Serial Number: 3842377

## 2021-08-30 DIAGNOSIS — I739 Peripheral vascular disease, unspecified: Secondary | ICD-10-CM | POA: Diagnosis not present

## 2021-08-30 DIAGNOSIS — I129 Hypertensive chronic kidney disease with stage 1 through stage 4 chronic kidney disease, or unspecified chronic kidney disease: Secondary | ICD-10-CM | POA: Diagnosis not present

## 2021-08-30 DIAGNOSIS — I442 Atrioventricular block, complete: Secondary | ICD-10-CM | POA: Diagnosis not present

## 2021-08-30 DIAGNOSIS — C8581 Other specified types of non-Hodgkin lymphoma, lymph nodes of head, face, and neck: Secondary | ICD-10-CM | POA: Diagnosis not present

## 2021-08-30 DIAGNOSIS — N183 Chronic kidney disease, stage 3 unspecified: Secondary | ICD-10-CM | POA: Diagnosis not present

## 2021-08-31 ENCOUNTER — Other Ambulatory Visit: Payer: Self-pay | Admitting: Family Medicine

## 2021-09-01 NOTE — Progress Notes (Signed)
Remote pacemaker transmission.   

## 2021-09-24 ENCOUNTER — Telehealth: Payer: Self-pay

## 2021-09-24 ENCOUNTER — Ambulatory Visit (INDEPENDENT_AMBULATORY_CARE_PROVIDER_SITE_OTHER): Payer: Medicare Other

## 2021-09-24 ENCOUNTER — Other Ambulatory Visit: Payer: Self-pay

## 2021-09-24 VITALS — Ht 72.0 in | Wt 230.0 lb

## 2021-09-24 DIAGNOSIS — Z0000 Encounter for general adult medical examination without abnormal findings: Secondary | ICD-10-CM | POA: Diagnosis not present

## 2021-09-24 NOTE — Telephone Encounter (Signed)
Called pt for AWV. Pt's daughter asks if her dad needs to get flu, shingles or covid booster vaccines, since he is mainly homebound. [name] states all the people that care for him are vaccinated against flu and covid. I told her I would ask you and see what you think. Thank you!

## 2021-09-24 NOTE — Patient Instructions (Signed)
Mr. [name] , Thank you for taking time to come for your Medicare Wellness Visit. I appreciate your ongoing commitment to your health goals. Please review the following plan we discussed and let me know if I can assist you in the future.   Screening recommendations/referrals: Colonoscopy: No longer required due to age.  Recommended yearly ophthalmology/optometry visit for glaucoma screening and checkup Recommended yearly dental visit for hygiene and checkup  Vaccinations: Influenza vaccine: Done 09/07/2020 Repeat annually  Pneumococcal vaccine: Done 04/10/2014 and 09/09/2019 Tdap vaccine: Repeat in 10 years  Shingles vaccine: Shingrix discussed. Please contact your pharmacy for coverage information.     Covid-19: Done 12/13/2019 and 01/03/2020  Advanced directives: Advance directive discussed with you today. I have provided a copy for you to complete at home and have notarized. Once this is complete please bring a copy in to our office so we can scan it into your chart.   Conditions/risks identified: Aim for 30 minutes of exercise each day, drink 6-8 glasses of water and eat lots of fruits and vegetables. KEEP UP THE GOOD WORK!!! LIVE TO AGE 39!!! :)  Next appointment: Follow up in one year for your annual wellness visit. 2023.  Preventive Care 36 Years and Older, Male  Preventive care refers to lifestyle choices and visits with your health care provider that can promote health and wellness. What does preventive care include? A yearly physical exam. This is also called an annual well check. Dental exams once or twice a year. Routine eye exams. Ask your health care provider how often you should have your eyes checked. Personal lifestyle choices, including: Daily care of your teeth and gums. Regular physical activity. Eating a healthy diet. Avoiding tobacco and drug use. Limiting alcohol use. Practicing safe sex. Taking low doses of aspirin every day. Taking vitamin and mineral  supplements as recommended by your health care provider. What happens during an annual well check? The services and screenings done by your health care provider during your annual well check will depend on your age, overall health, lifestyle risk factors, and family history of disease. Counseling  Your health care provider may ask you questions about your: Alcohol use. Tobacco use. Drug use. Emotional well-being. Home and relationship well-being. Sexual activity. Eating habits. History of falls. Memory and ability to understand (cognition). Work and work Statistician. Screening  You may have the following tests or measurements: Height, weight, and BMI. Blood pressure. Lipid and cholesterol levels. These may be checked every 5 years, or more frequently if you are over 21 years old. Skin check. Lung cancer screening. You may have this screening every year starting at age 57 if you have a 30-pack-year history of smoking and currently smoke or have quit within the past 15 years. Fecal occult blood test (FOBT) of the stool. You may have this test every year starting at age 50. Flexible sigmoidoscopy or colonoscopy. You may have a sigmoidoscopy every 5 years or a colonoscopy every 10 years starting at age 50. Prostate cancer screening. Recommendations will vary depending on your family history and other risks. Hepatitis C blood test. Hepatitis B blood test. Sexually transmitted disease (STD) testing. Diabetes screening. This is done by checking your blood sugar (glucose) after you have not eaten for a while (fasting). You may have this done every 1-3 years. Abdominal aortic aneurysm (AAA) screening. You may need this if you are a current or former smoker. Osteoporosis. You may be screened starting at age 84 if you are at high risk.  Talk with your health care provider about your test results, treatment options, and if necessary, the need for more tests. Vaccines  Your health care provider  may recommend certain vaccines, such as: Influenza vaccine. This is recommended every year. Tetanus, diphtheria, and acellular pertussis (Tdap, Td) vaccine. You may need a Td booster every 10 years. Zoster vaccine. You may need this after age 76. Pneumococcal 13-valent conjugate (PCV13) vaccine. One dose is recommended after age 98. Pneumococcal polysaccharide (PPSV23) vaccine. One dose is recommended after age 61. Talk to your health care provider about which screenings and vaccines you need and how often you need them. This information is not intended to replace advice given to you by your health care provider. Make sure you discuss any questions you have with your health care provider. Document Released: 12/11/2015 Document Revised: 08/03/2016 Document Reviewed: 09/15/2015 Elsevier Interactive Patient Education  2017 [employer] Prevention in the Home Falls can cause injuries. They can happen to people of all ages. There are many things you can do to make your home safe and to help prevent falls. What can I do on the outside of my home? Regularly fix the edges of walkways and driveways and fix any cracks. Remove anything that might make you trip as you walk through a door, such as a raised step or threshold. Trim any bushes or trees on the path to your home. Use bright outdoor lighting. Clear any walking paths of anything that might make someone trip, such as rocks or tools. Regularly check to see if handrails are loose or broken. Make sure that both sides of any steps have handrails. Any raised decks and porches should have guardrails on the edges. Have any leaves, snow, or ice cleared regularly. Use sand or salt on walking paths during winter. Clean up any spills in your garage right away. This includes oil or grease spills. What can I do in the bathroom? Use night lights. Install grab bars by the toilet and in the tub and shower. Do not use towel bars as grab bars. Use  non-skid mats or decals in the tub or shower. If you need to sit down in the shower, use a plastic, non-slip stool. Keep the floor dry. Clean up any water that spills on the floor as soon as it happens. Remove soap buildup in the tub or shower regularly. Attach bath mats securely with double-sided non-slip rug tape. Do not have throw rugs and other things on the floor that can make you trip. What can I do in the bedroom? Use night lights. Make sure that you have a light by your bed that is easy to reach. Do not use any sheets or blankets that are too big for your bed. They should not hang down onto the floor. Have a firm chair that has side arms. You can use this for support while you get dressed. Do not have throw rugs and other things on the floor that can make you trip. What can I do in the kitchen? Clean up any spills right away. Avoid walking on wet floors. Keep items that you use a lot in easy-to-reach places. If you need to reach something above you, use a strong step stool that has a grab bar. Keep electrical cords out of the way. Do not use floor polish or wax that makes floors slippery. If you must use wax, use non-skid floor wax. Do not have throw rugs and other things on the floor that can make  you trip. What can I do with my stairs? Do not leave any items on the stairs. Make sure that there are handrails on both sides of the stairs and use them. Fix handrails that are broken or loose. Make sure that handrails are as long as the stairways. Check any carpeting to make sure that it is firmly attached to the stairs. Fix any carpet that is loose or worn. Avoid having throw rugs at the top or bottom of the stairs. If you do have throw rugs, attach them to the floor with carpet tape. Make sure that you have a light switch at the top of the stairs and the bottom of the stairs. If you do not have them, ask someone to add them for you. What else can I do to help prevent falls? Wear  shoes that: Do not have high heels. Have rubber bottoms. Are comfortable and fit you well. Are closed at the toe. Do not wear sandals. If you use a stepladder: Make sure that it is fully opened. Do not climb a closed stepladder. Make sure that both sides of the stepladder are locked into place. Ask someone to hold it for you, if possible. Clearly mark and make sure that you can see: Any grab bars or handrails. First and last steps. Where the edge of each step is. Use tools that help you move around (mobility aids) if they are needed. These include: Canes. Walkers. Scooters. Crutches. Turn on the lights when you go into a dark area. Replace any light bulbs as soon as they burn out. Set up your furniture so you have a clear path. Avoid moving your furniture around. If any of your floors are uneven, fix them. If there are any pets around you, be aware of where they are. Review your medicines with your doctor. Some medicines can make you feel dizzy. This can increase your chance of falling. Ask your doctor what other things that you can do to help prevent falls. This information is not intended to replace advice given to you by your health care provider. Make sure you discuss any questions you have with your health care provider. Document Released: 09/10/2009 Document Revised: 04/21/2016 Document Reviewed: 12/19/2014 Elsevier Interactive Patient Education  2017 [employer].

## 2021-09-24 NOTE — Progress Notes (Signed)
Subjective:   [name] is a 87 y.o. male who presents for an Initial Medicare Annual Wellness Visit. Virtual Visit via Telephone Note  I connected with  [name] on 09/24/21 at  2:00 PM EDT by telephone and verified that I am speaking with the correct person using two identifiers.  Location: Patient: Home Provider: [facility] Persons participating in the virtual visit: patient/Nurse Health Advisor   I discussed the limitations, risks, security and privacy concerns of performing an evaluation and management service by telephone and the availability of in person appointments. The patient expressed understanding and agreed to proceed.  Interactive audio and video telecommunications were attempted between this nurse and patient, however failed, due to patient having technical difficulties OR patient did not have access to video capability.  We continued and completed visit with audio only.  Some vital signs may be absent or patient reported.   Chriss Driver, LPN  Review of Systems     Cardiac Risk Factors include: advanced age (>67mn, >>4women);hypertension;male gender;sedentary lifestyle     Objective:    Today's Vitals   09/24/21 1359  Weight: 230 lb (104.3 kg)  Height: 6' (1.829 m)   Body mass index is 31.19 kg/m.  Advanced Directives 09/24/2021 05/27/2020 05/27/2020 11/07/2018 11/07/2018 11/06/2018 01/17/2017  Does Patient Have a Medical Advance Directive? No Yes Yes No No No No  Type of Advance Directive - Healthcare Power of [place] Does patient want to make changes to medical advance directive? No - Patient declined No - Patient declined - - - - -  Copy of [place] Chart? - No - copy requested - - - - -  Would patient like information on creating a medical advance directive? No - Patient declined - - No - Patient declined - No - Patient declined No - Patient declined  Pre-existing out of facility DNR  order (yellow form or pink MOST form) - - - - - - -    Current Medications (verified) Outpatient Encounter Medications as of 09/24/2021  Medication Sig   amLODipine (NORVASC) 5 MG tablet TAKE 1 TABLET BY MOUTH  DAILY   aspirin EC 81 MG tablet Take 81 mg by mouth daily. Swallow whole.   betamethasone dipropionate 0.05 % cream Apply topically 2 (two) times daily. Apply small amount to red areas on legs twice daily.   diphenhydramine-acetaminophen (TYLENOL PM) 25-500 MG TABS tablet Take 1 tablet by mouth at bedtime as needed.   enalapril (VASOTEC) 20 MG tablet TAKE 1 TABLET BY MOUTH  DAILY   escitalopram (LEXAPRO) 10 MG tablet TAKE 1 TABLET BY MOUTH EVERY DAY   furosemide (LASIX) 20 MG tablet TAKE 2 TABLETS BY MOUTH  DAILY   levothyroxine (SYNTHROID) 112 MCG tablet TAKE 1 TABLET BY MOUTH  DAILY   NONFORMULARY OR COMPOUNDED ITEM See pharmacy note (Patient taking differently: as needed. Compound Mixture for feet)   Rivaroxaban (XARELTO) 15 MG TABS tablet Take 1 tablet (15 mg total) by mouth daily with supper.   triamcinolone (KENALOG) 0.1 % Apply 1 application topically 3 (three) times daily.   vitamin B-12 (CYANOCOBALAMIN) 1000 MCG tablet Take 1 tablet (1,000 mcg total) by mouth daily.   No facility-administered encounter medications on file as of 09/24/2021.    Allergies (verified) Codeine and Percocet [oxycodone-acetaminophen]   History: Past Medical History:  Diagnosis Date   Acid reflux    [place]cer [place]  left neck-non-hodgkins lymphoma   Hypertension    requires diuretic   Hypothyroidism    Malaria    while in the service   Peripheral vascular disease ([place])    slow wound healing on legs   Past Surgical History:  Procedure Laterality Date   AMPUTATION Left 06/25/2015   Procedure: LEFT  HALLUX AMPUTATION,;  Surgeon: Wylene Simmer, MD;  Location: [place];  Service: Orthopedics;  Laterality: Left;  LOCAL/MAC   CHOLECYSTECTOMY N/A 11/08/2018   Procedure:  LAPAROSCOPIC CHOLECYSTECTOMY WITH INTRAOPERATIVE CHOLANGIOGRAM;  Surgeon: [name], MD;  Location: [facility];  Service: General;  Laterality: N/A;   MASS BIOPSY Left 09/05/2013   Procedure: EXCISIONAL BIOPSY OF LEFT NECK MASS;  Surgeon: Jerrell Belfast, MD;  Location: [place];  Service: ENT;  Laterality: Left;   PACEMAKER IMPLANT N/A 05/28/2020   Procedure: PACEMAKER IMPLANT;  Surgeon: Thompson Grayer, MD;  Location: [facility];  Service: Cardiovascular;  Laterality: N/A;   TONSILLECTOMY     YAG LASER APPLICATION Right 05/25/3661   Procedure: YAG LASER APPLICATION;  Surgeon: [name], MD;  Location: [facility];  Service: Ophthalmology;  Laterality: Right;   Family History  Problem Relation Age of Onset   Lung cancer Brother    Heart disease Brother    Stroke Brother    Social History   Socioeconomic History   Marital status: Widowed    Spouse name: Not on file   Number of children: 4   Years of education: Not on file   Highest education level: Not on file  Occupational History   Occupation: retired  Tobacco Use   Smoking status: Former    Types: Cigars   Smokeless tobacco: Former    Quit date: 10/05/1979  Substance and Sexual Activity   Alcohol use: No   Drug use: No   Sexual activity: Not on file  Other Topics Concern   Not on file  Social History Narrative   Not on file   Social Determinants of Health   Financial Resource Strain: Low Risk    Difficulty of Paying Living Expenses: Not hard at all  Food Insecurity: No Food Insecurity   Worried About Charity fundraiser in the Last Year: Never true   Arboriculturist in the Last Year: Never true  Transportation Needs: No Transportation Needs   Lack of Transportation (Medical): No   Lack of Transportation (Non-Medical): No  Physical Activity: Insufficiently Active   Days of Exercise per Week: 3 days   Minutes of Exercise per Session: 20 min  Stress: No Stress Concern Present   Feeling of Stress : Not at all  Social  Connections: Socially Isolated   Frequency of Communication with Friends and Family: More than three times a week   Frequency of Social Gatherings with Friends and Family: More than three times a week   Attends Religious Services: Never   Marine scientist or Organizations: No   Attends Archivist Meetings: Never   Marital Status: Widowed    Tobacco Counseling Counseling given: Not Answered   Clinical Intake:  Pre-visit preparation completed: Yes  Pain : No/denies pain     BMI - recorded: 31.19 Nutritional Status: BMI > 30  Obese Nutritional Risks: None Diabetes: No  How often do you need to have someone help you when you read instructions, pamphlets, or other written materials from your doctor or pharmacy?: 5 - Always (Due to vision issues.)  Diabetic?NO  Interpreter Needed?:  No  Information entered by :: MJ Yulonda Wheeling, LPN   Activities of Daily Living In your present state of health, do you have any difficulty performing the following activities: 09/24/2021  Hearing? Y  Comment Hearing loss, no hearing aids.  Vision? Y  Comment Macular Degeneration  Difficulty concentrating or making decisions? N  Walking or climbing stairs? N  Dressing or bathing? N  Doing errands, shopping? N  Preparing Food and eating ? N  Using the Toilet? N  In the past six months, have you accidently leaked urine? N  Do you have problems with loss of bowel control? Y  Comment At times since gallbladder removed.  Managing your Medications? Y  Comment Children manage.  Managing your Finances? Y  Housekeeping or managing your Housekeeping? Y  Some recent data might be hidden    Patient Care Team: Susy Frizzle, MD as PCP - General (Family Medicine) Buford Dresser, MD as PCP - Cardiology (Cardiology) Thompson Grayer, MD as PCP - Electrophysiology (Cardiology) Edythe Clarity, [facility] as Pharmacist (Pharmacist)  Indicate any recent Medical Services you may have  received from other than [facility] providers in the past year (date may be approximate).     Assessment:   This is a routine wellness examination for [place].  Hearing/Vision screen Hearing Screening - Comments:: Hearing issues.  Vision Screening - Comments:: No glasses. [place]. Macular Dengeneration. Returns in January 2023.  Dietary issues and exercise activities discussed: Current Exercise Habits: Home exercise routine, Type of exercise: Other - see comments (chair exercises.), Time (Minutes): 10, Frequency (Times/Week): 3, Weekly Exercise (Minutes/Week): 30, Intensity: Mild, Exercise limited by: cardiac condition(s);orthopedic condition(s)   Goals Addressed             This Visit's Progress    Manage My Medicine   On track    Timeframe:  Long-Range Goal Priority:  High Start Date: 03/25/21                             Expected End Date: 09/24/21                      Follow Up Date 06/27/21   - call if I am sick and can't take my medicine - keep a list of all the medicines I take; vitamins and herbals too - use a pillbox to sort medicine - use an alarm clock or phone to remind me to take my medicine    Why is this important?   These steps will help you keep on track with your medicines.   Notes: Keep track of adverse effects from medications.       Depression Screen PHQ 2/9 Scores 09/24/2021 05/18/2020 02/20/2018 11/27/2017  PHQ - 2 Score 0 2 0 0  PHQ- 9 Score - 8 - -    Fall Risk Fall Risk  09/24/2021 02/26/2021 12/02/2020 05/18/2020 02/20/2018  Falls in the past year? 0 0 0 0 No  Number falls in past yr: 0 0 0 0 -  Injury with Fall? 0 - 0 0 -  Comment - - - - -  Risk Factor Category  - - - - -  Risk for fall due to : Impaired balance/gait;Impaired mobility;Impaired vision;History of fall(s) - - - -  Follow up Falls prevention discussed - - Falls evaluation completed -    FALL RISK PREVENTION PERTAINING TO THE HOME:  Any stairs in or around the home?  No  If so,  are there any without handrails? No  Home free of loose throw rugs in walkways, pet beds, electrical cords, etc? Yes  Adequate lighting in your home to reduce risk of falls? Yes   ASSISTIVE DEVICES UTILIZED TO PREVENT FALLS:  Life alert? Yes  Use of a cane, walker or w/c? Yes  Grab bars in the bathroom? Yes  Shower chair or bench in shower? Yes  Elevated toilet seat or a handicapped toilet? Yes   TIMED UP AND GO:  Was the test performed? No .   Cognitive Function:     6CIT Screen 09/24/2021  What Year? 0 points  What month? 0 points  What time? 0 points  Count back from 20 0 points  Months in reverse 4 points  Repeat phrase 6 points  Total Score 10    Immunizations Immunization History  Administered Date(s) Administered   Fluad Quad(high Dose 65+) 09/09/2019, 09/07/2020   Influenza, High Dose Seasonal PF 09/09/2015, 09/26/2017, 08/27/2018   Influenza,inj,Quad PF,6+ Mos 09/11/2014, 09/02/2016   Influenza-Unspecified 09/26/2017   PFIZER(Purple Top)SARS-COV-2 Vaccination 12/13/2019, 01/03/2020   Pneumococcal Conjugate-13 04/10/2014   Pneumococcal Polysaccharide-23 09/09/2019    TDAP status: Due, Education has been provided regarding the importance of this vaccine. Advised may receive this vaccine at local pharmacy or Health Dept. Aware to provide a copy of the vaccination record if obtained from local pharmacy or Health Dept. Verbalized acceptance and understanding.  Flu Vaccine status: Due, Education has been provided regarding the importance of this vaccine. Advised may receive this vaccine at local pharmacy or Health Dept. Aware to provide a copy of the vaccination record if obtained from local pharmacy or Health Dept. Verbalized acceptance and understanding.  Pneumococcal vaccine status: Up to date  Covid-19 vaccine status: Information provided on how to obtain vaccines.   Qualifies for Shingles Vaccine? Yes   Zostavax completed No   Shingrix Completed?: No.     Education has been provided regarding the importance of this vaccine. Patient has been advised to call insurance company to determine out of pocket expense if they have not yet received this vaccine. Advised may also receive vaccine at local pharmacy or Health Dept. Verbalized acceptance and understanding.  Screening Tests Health Maintenance  Topic Date Due   OPHTHALMOLOGY EXAM  Never done   Zoster Vaccines- Shingrix (1 of 2) Never done   HEMOGLOBIN A1C  07/18/2017   COVID-19 Vaccine (3 - Pfizer risk series) 01/31/2020   INFLUENZA VACCINE  06/28/2021   TETANUS/TDAP  12/02/2021 (Originally 09/17/1938)   FOOT EXAM  12/04/2021   Pneumonia Vaccine 39+ Years old  Completed   HPV VACCINES  Aged Out    Health Maintenance  Health Maintenance Due  Topic Date Due   OPHTHALMOLOGY EXAM  Never done   Zoster Vaccines- Shingrix (1 of 2) Never done   HEMOGLOBIN A1C  07/18/2017   COVID-19 Vaccine (3 - Pfizer risk series) 01/31/2020   INFLUENZA VACCINE  06/28/2021    Colorectal cancer screening: No longer required.   Lung Cancer Screening: (Low Dose CT Chest recommended if Age 37-80 years, 30 pack-year currently smoking OR have quit w/in 15years.) does not qualify.    Additional Screening:  Hepatitis C Screening: does not qualify.  Vision Screening: Recommended annual ophthalmology exams for early detection of glaucoma and other disorders of the eye. Is the patient up to date with their annual eye exam?  Yes  Who is the provider or what is the name of the  office in which the patient attends annual eye exams? [facility]. If pt is not established with a provider, would they like to be referred to a provider to establish care? No .   Dental Screening: Recommended annual dental exams for proper oral hygiene  Community Resource Referral / Chronic Care Management: CRR required this visit?  No   CCM required this visit?  No      Plan:     I have personally reviewed and  noted the following in the patient's chart:   Medical and social history Use of alcohol, tobacco or illicit drugs  Current medications and supplements including opioid prescriptions. Patient is not currently taking opioid prescriptions. Functional ability and status Nutritional status Physical activity Advanced directives List of other physicians Hospitalizations, surgeries, and ER visits in previous 12 months Vitals Screenings to include cognitive, depression, and falls Referrals and appointments  In addition, I have reviewed and discussed with patient certain preventive protocols, quality metrics, and best practice recommendations. A written personalized care plan for preventive services as well as general preventive health recommendations were provided to patient.     Chriss Driver, LPN   70/76/1518   Nurse Notes: Pt states he is doing well. Daughter, [name], assisted with visit due to patient's hearing issues. Colonoscopy no longer required due to age. Discussed flu, shingles and covid booster vaccines.

## 2021-09-28 NOTE — Progress Notes (Deleted)
Chronic Care Management Pharmacy Note  09/30/2021 Name:  [name] MRN:  [id number] DOB:  Jul 29, 1919  Subjective: [name] is an 87 y.o. year old male who is a primary patient of Pickard, Cammie Mcgee, MD.  The CCM team was consulted for assistance with disease management and care coordination needs.    Engaged with patient face to face for follow up visit in response to provider referral for pharmacy case management and/or care coordination services.   Consent to Services:  The patient was given the following information about Chronic Care Management services today, agreed to services, and gave verbal consent: 1. CCM service includes personalized support from designated clinical staff supervised by the primary care provider, including individualized plan of care and coordination with other care providers 2. 24/7 contact phone numbers for assistance for urgent and routine care needs. 3. Service will only be billed when office clinical staff spend 20 minutes or more in a month to coordinate care. 4. Only one practitioner may furnish and bill the service in a calendar month. 5.The patient may stop CCM services at any time (effective at the end of the month) by phone call to the office staff. 6. The patient will be responsible for cost sharing (co-pay) of up to 20% of the service fee (after annual deductible is met). Patient agreed to services and consent obtained.  Patient Care Team: Susy Frizzle, MD as PCP - General (Family Medicine) Buford Dresser, MD as PCP - Cardiology (Cardiology) Thompson Grayer, MD as PCP - Electrophysiology (Cardiology) Edythe Clarity, [facility] as Pharmacist (Pharmacist)  Recent office visits: 02/26/21 Dr. Dennard Schaumann. For peripheral vascular disease/Medication change. STARTED Triamcinolone Acetonide 0.1% 3 times daily. CHANGED Lyrica to 50 mg at night.  12/04/20 Eulogio Bear, NP. For peripheral vascular disease. STARTED betamethasone dipropionate 0.05%  topical 2 times daily  12/02/20 Eulogio Bear, NP. For leg swelling. Per note: the doctor felt like the leg swelling occurred from chronic venous stasis. No medication changes.  Recent consult visits: 03/09/21 Podiatry Felipa Furnace DPM. For Ulcer of left heel. No medication changes.  02/18/21 Podiatry Felipa Furnace DPM. For peripheral vascular disease. STARTED Pregabalin 50 mg 3 times daily.  11/19/20 Podiatry Felipa Furnace DPM. For pressure injury of left heel. Per note: The patient and doctor discussed off loading his feet while sitting and laying down. No medication changes.  Hospital visits: None in previous 6 months  Objective:  Lab Results  Component Value Date   CREATININE 1.60 (H) 06/25/2021   BUN 38 (H) 06/25/2021   GFRNONAA 43 (L) 05/27/2020   GFRAA 50 (L) 05/27/2020   NA 143 06/25/2021   K 5.4 (H) 06/25/2021   CALCIUM 9.8 06/25/2021   CO2 24 06/25/2021   GLUCOSE 103 (H) 06/25/2021    Lab Results  Component Value Date/Time   HGBA1C 5.4 01/18/2017 05:00 AM    Last diabetic Eye exam: No results found for: HMDIABEYEEXA  Last diabetic Foot exam: No results found for: HMDIABFOOTEX   No results found for: CHOL, HDL, LDLCALC, LDLDIRECT, TRIG, CHOLHDL  Hepatic Function Latest Ref Rng & Units 06/25/2021 05/27/2020 05/18/2020  Total Protein 6.1 - 8.1 g/dL 7.2 8.1 7.6  Albumin 3.5 - 5.0 g/dL - 4.8 -  AST 10 - 35 U/L '17 25 19  ' ALT 9 - 46 U/L '10 14 9  ' Alk Phosphatase 38 - 126 U/L - 51 -  Total Bilirubin 0.2 - 1.2 mg/dL 0.4 1.0 0.6  Bilirubin,  Direct 0.0 - 0.2 mg/dL - - -    Lab Results  Component Value Date/Time   TSH 0.42 06/25/2021 02:49 PM   TSH 2.59 05/18/2020 11:26 AM    CBC Latest Ref Rng & Units 06/25/2021 05/27/2020 05/18/2020  WBC 3.8 - 10.8 Thousand/uL 6.8 6.5 5.7  Hemoglobin 13.2 - 17.1 g/dL 12.8(L) 14.2 14.1  Hematocrit 38.5 - 50.0 % 38.5 43.3 41.5  Platelets 140 - 400 Thousand/uL 151 182 163    No results found for: VD25OH  Clinical ASCVD: No   The ASCVD Risk score (Arnett DK, et al., 2019) failed to calculate for the following reasons:   The 2019 ASCVD risk score is only valid for ages 38 to 10    Depression screen PHQ 2/9 09/24/2021 05/18/2020 02/20/2018  Decreased Interest 0 1 0  Down, Depressed, Hopeless 0 1 0  PHQ - 2 Score 0 2 0  Altered sleeping - 1 -  Tired, decreased energy - 1 -  Change in appetite - 1 -  Feeling bad or failure about yourself  - 1 -  Trouble concentrating - 1 -  Moving slowly or fidgety/restless - 1 -  Suicidal thoughts - 0 -  PHQ-9 Score - 8 -  Difficult doing work/chores - Not difficult at all -      Social History   Tobacco Use  Smoking Status Former   Types: Cigars  Smokeless Tobacco Former   Quit date: 10/05/1979   BP Readings from Last 3 Encounters:  08/09/21 118/68  07/07/21 110/68  06/25/21 106/72   Pulse Readings from Last 3 Encounters:  08/09/21 70  07/07/21 62  06/25/21 88   Wt Readings from Last 3 Encounters:  09/24/21 230 lb (104.3 kg)  08/09/21 230 lb (104.3 kg)  07/07/21 230 lb (104.3 kg)   BMI Readings from Last 3 Encounters:  09/24/21 31.19 kg/m  08/09/21 31.19 kg/m  07/07/21 31.19 kg/m    Assessment/Interventions: Review of patient past medical history, allergies, medications, health status, including review of consultants reports, laboratory and other test data, was performed as part of comprehensive evaluation and provision of chronic care management services.   SDOH:  (Social Determinants of Health) assessments and interventions performed: Yes  SDOH Screenings   Alcohol Screen: Low Risk    Last Alcohol Screening Score (AUDIT): 0  Depression (PHQ2-9): Low Risk    PHQ-2 Score: 0  Financial Resource Strain: Low Risk    Difficulty of Paying Living Expenses: Not hard at all  Food Insecurity: No Food Insecurity   Worried About Charity fundraiser in the Last Year: Never true   Ran Out of Food in the Last Year: Never true  Housing: Low Risk    Last  Housing Risk Score: 0  Physical Activity: Insufficiently Active   Days of Exercise per Week: 3 days   Minutes of Exercise per Session: 20 min  Social Connections: Socially Isolated   Frequency of Communication with Friends and Family: More than three times a week   Frequency of Social Gatherings with Friends and Family: More than three times a week   Attends Religious Services: Never   Marine scientist or Organizations: No   Attends Archivist Meetings: Never   Marital Status: Widowed  Stress: No Stress Concern Present   Feeling of Stress : Not at all  Tobacco Use: Medium Risk   Smoking Tobacco Use: Former   Smokeless Tobacco Use: Former   Passive Exposure: Not on file  Transportation Needs: No Data processing manager (Medical): No   Lack of Transportation (Non-Medical): No    CCM Care Plan  Allergies  Allergen Reactions   Codeine Nausea And Vomiting   Percocet [Oxycodone-Acetaminophen] Nausea And Vomiting    Medications Reviewed Today     Reviewed by Chriss Driver, LPN (Licensed Practical Nurse) on 09/24/21 at 1403  Med List Status: <None>   Medication Order Taking? Sig Documenting Provider Last Dose Status Informant  amLODipine (NORVASC) 5 MG tablet [id number] Yes TAKE 1 TABLET BY MOUTH  DAILY Susy Frizzle, MD Taking Active   aspirin EC 81 MG tablet [id number] Yes Take 81 mg by mouth daily. Swallow whole. [provider] Taking Active   betamethasone dipropionate 0.05 % cream [id number] Yes Apply topically 2 (two) times daily. Apply small amount to red areas on legs twice daily. Eulogio Bear, NP Taking Active   diphenhydramine-acetaminophen (TYLENOL PM) 25-500 MG TABS tablet [id number] Yes Take 1 tablet by mouth at bedtime as needed. [provider] Taking Active   enalapril (VASOTEC) 20 MG tablet [id number] Yes TAKE 1 TABLET BY MOUTH  DAILY Susy Frizzle, MD Taking Active   escitalopram (LEXAPRO) 10 MG  tablet [id number] Yes TAKE 1 TABLET BY MOUTH EVERY DAY Annie Main, FNP Taking Active   furosemide (LASIX) 20 MG tablet [id number] Yes TAKE 2 TABLETS BY MOUTH  DAILY Susy Frizzle, MD Taking Active            Med Note Gaye Alken Jul 07, 2021 11:51 AM)    levothyroxine (SYNTHROID) 112 MCG tablet [id number] Yes TAKE 1 TABLET BY MOUTH  DAILY Susy Frizzle, MD Taking Active   NONFORMULARY OR COMPOUNDED Peter Congo [id number] Yes See pharmacy note  Patient taking differently: as needed. Compound Mixture for feet   Felipa Furnace, DPM Taking Active   Rivaroxaban (XARELTO) 15 MG TABS tablet [id number] Yes Take 1 tablet (15 mg total) by mouth daily with supper. Shirley Friar, PA-C Taking Active   triamcinolone (KENALOG) 0.1 % [id number] Yes Apply 1 application topically 3 (three) times daily. Susy Frizzle, MD Taking Active   vitamin B-12 (CYANOCOBALAMIN) 1000 MCG tablet [id number] Yes Take 1 tablet (1,000 mcg total) by mouth daily. Susy Frizzle, MD Taking Active Child            Patient Active Problem List   Diagnosis Date Noted   Leg swelling 12/02/2020   Heart block 05/27/2020   Fall 05/27/2020   Heart block AV complete ([place]) 05/27/2020   Hypothyroidism 11/07/2018   CKD (chronic kidney disease) stage 3, GFR 30-59 ml/min 11/07/2018   Abnormal liver function 11/07/2018   NHL (non-Hodgkin's lymphoma) ([place]) 11/07/2018   Venous stasis dermatitis 05/28/2015   Insomnia 05/28/2015   Cancer ([place]) 09/05/2013   Lymphadenopathy, cervical 08/29/2013    Class: Chronic   Acid reflux    Hypertension    Peripheral vascular disease ([place])     Immunization History  Administered Date(s) Administered   Fluad Quad(high Dose 65+) 09/09/2019, 09/07/2020   Influenza, High Dose Seasonal PF 09/09/2015, 09/26/2017, 08/27/2018   Influenza,inj,Quad PF,6+ Mos 09/11/2014, 09/02/2016   Influenza-Unspecified 09/26/2017   PFIZER(Purple Top)SARS-COV-2 Vaccination 12/13/2019,  01/03/2020   Pneumococcal Conjugate-13 04/10/2014   Pneumococcal Polysaccharide-23 09/09/2019    Conditions to be addressed/monitored:  HTN, Acid Reflux, Hypothryoidism, CKD, Insomnia  There are no care plans that you recently modified to display for this patient.  Medication Assistance: None required.  Patient affirms current coverage meets needs.  Patient's preferred pharmacy is:  [id number] [place], [place]Phone: [phone]: [phone] [facility] ([place] [facility]Phone: [phone]: [phone] [facility] - [place] [address] [address]Phone: [phone]: [phone] Uses pill box? Yes Pt endorses 100% compliance  We discussed: Benefits of medication synchronization, packaging and delivery as well as enhanced pharmacist oversight with [employer]. Patient decided to: Continue current medication management strategy  Care Plan and Follow Up Patient Decision:  Patient agrees to Care Plan and Follow-up.  Plan: The care management team will reach out to the patient again over the next 120 days.  CBeverly Milch PharmD Clinical Pharmacist BJonni SangerFamily Medicine ([phone]   Current Barriers:  Unable to achieve control of neuropathy.   Pharmacist Clinical Goal(s):  Patient will achieve control of neuropathy as evidenced by pain level and activity. contact provider office for questions/concerns as evidenced notation of same in electronic health record through collaboration with PharmD and provider.   Interventions: 1:1 collaboration with PSusy Frizzle MD regarding development and update of comprehensive plan of care as evidenced by  provider attestation and co-signature Inter-disciplinary care team collaboration (see longitudinal plan of care) Comprehensive medication review performed; medication list updated in electronic medical record  Hypertension (BP goal <140/90) -Controlled -Current treatment: Amlodipine 571mdaily Enalapril 2032maily -Medications previously tried: clonidine 0.1mg69murrent home readings: none available, daughter is a nurse and checks if he is symptomatic but no logs today  -Current exercise habits: he loves to walk, however he is having severe neuropathic pain in feet taking Lyrica, he is often unstable on his feet when he takes this during the day. -Denies hypotensive/hypertensive symptoms -Educated on BP goals and benefits of medications for prevention of heart attack, stroke and kidney damage; Importance of home blood pressure monitoring; Symptoms of hypotension and importance of maintaining adequate hydration; -Counseled to monitor BP at home when symptomatic, document, and provide log at future appointments -Recommended to continue current medication  Neuropathy(Goal: Minimize symptoms) -Controlled -Current treatment  Lyrica 50mg56mdications previously tried: none noted -Reports this has helped his pain and since he stopped other medications he has not had any hallucinations.  However, when he takes it during the daytime he often feels unstable on his feet.  -Main goal for him would be to be able to feel good enough to get out and walk outside when he wants to do that. -Recommended to continue current medication Collaborated with PCP on possible dose change to 25mg 68mng the day and 50mg a65mght to see if patient tolerates the lower dose better during the daytime.  Patient and wife were agreeable.  Will follow up with PCP confirmation.   Insomnia (Goal: Healthy sleep patterns) -Controlled -Current treatment  None -Medications previously tried: Ativan, Benadryl -No longer taking  these medications as thee combination of this was causing him hallucinations with addition of Lyrica -Reports sleep has been good since just taking Lyrica at bedtime, he has not been having hallucinations with just Lyrica alone  -Recommended continue current management strategy  Hypothyroidism (Goal: Maintain TSH) -Controlled -Current treatment  Levothyroxine  151mg daily -Medications previously tried: none noted -taking appropriately, TSH WNL  -Recommended to continue current medication   Acid reflux (Goal: Minimize symptoms) -Controlled -Current treatment  Omeprazole OTC Tums OTC -Medications previously tried: famotidine -Counseled on appropriate timing of medication. -States symptoms are controlled.   -Recommended to continue current medication   Patient Goals/Self-Care Activities Patient will:  - take medications as prescribed check blood pressure as needed, document, and provide at future appointments Await decision on dose change for Lyrica  Follow Up Plan: The care management team will reach out to the patient again over the next 120 days.

## 2021-09-30 ENCOUNTER — Telehealth: Payer: Self-pay

## 2021-11-26 ENCOUNTER — Ambulatory Visit (INDEPENDENT_AMBULATORY_CARE_PROVIDER_SITE_OTHER): Payer: Medicare Other

## 2021-11-26 DIAGNOSIS — I442 Atrioventricular block, complete: Secondary | ICD-10-CM

## 2021-11-26 LAB — CUP PACEART REMOTE DEVICE CHECK
Battery Remaining Longevity: 103 mo
Battery Remaining Percentage: 83 %
Battery Voltage: 3.01 V
Brady Statistic AP VP Percent: 0 %
Brady Statistic AP VS Percent: 0 %
Brady Statistic AS VP Percent: 0 %
Brady Statistic AS VS Percent: 0 %
Brady Statistic RA Percent Paced: 1 %
Brady Statistic RV Percent Paced: 94 %
Date Time Interrogation Session: 20221230020013
Implantable Lead Implant Date: 20210701
Implantable Lead Implant Date: 20210701
Implantable Lead Location: 753859
Implantable Lead Location: 753860
Implantable Pulse Generator Implant Date: 20210701
Lead Channel Impedance Value: 400 Ohm
Lead Channel Impedance Value: 510 Ohm
Lead Channel Pacing Threshold Amplitude: 0.75 V
Lead Channel Pacing Threshold Amplitude: 1.25 V
Lead Channel Pacing Threshold Pulse Width: 0.5 ms
Lead Channel Pacing Threshold Pulse Width: 0.5 ms
Lead Channel Sensing Intrinsic Amplitude: 1.9 mV
Lead Channel Sensing Intrinsic Amplitude: 12 mV
Lead Channel Setting Pacing Amplitude: 1 V
Lead Channel Setting Pacing Amplitude: 2.5 V
Lead Channel Setting Pacing Pulse Width: 0.5 ms
Lead Channel Setting Sensing Sensitivity: 2 mV
Pulse Gen Model: 2272
Pulse Gen Serial Number: 3842377

## 2021-12-06 ENCOUNTER — Other Ambulatory Visit: Payer: Self-pay

## 2021-12-06 ENCOUNTER — Encounter: Payer: Self-pay | Admitting: Family Medicine

## 2021-12-06 ENCOUNTER — Ambulatory Visit (INDEPENDENT_AMBULATORY_CARE_PROVIDER_SITE_OTHER): Payer: Medicare Other | Admitting: Family Medicine

## 2021-12-06 VITALS — BP 128/68 | HR 52 | Temp 97.7°F | Resp 18 | Ht 72.0 in | Wt 230.0 lb

## 2021-12-06 DIAGNOSIS — R2241 Localized swelling, mass and lump, right lower limb: Secondary | ICD-10-CM

## 2021-12-06 NOTE — Progress Notes (Signed)
Subjective:    Patient ID: MINORU CHAP, male    DOB: 10/23/1919, 88 y.o.   MRN: [id number]  Patient is a very pleasant 88 year old Caucasian gentleman who contracted COVID around December 23.  Roughly around the same time, he lost his balance and fell in his home.  He twisted his right knee as he fell and he landed directly on his patella.  He presents today with swelling in his leg.  There is significant bruising above his knee.  This bruising is yellow and brown indicating an old bruise.  There is palpable edema around the knee and anterior to the patella suggesting swelling from the contusion.  There is also swelling in his right hamstring.  The swelling is subcutaneous.  The leg has a brownish-greenish hue leading me to believe that the swelling that is subcutaneous is likely blood there is a dried hematoma.  There is also a bruise on his anterior right shin that is bright purple and roughly the diameter of a baseball.  He has palpable dorsalis pedis and posterior tibialis pulses.  He denies any pain in the leg.  He is able to wiggle his toes and his ankles without any pain.  He has been walking putting weight on his leg without any pain however the swelling is concerning him causing him to make the appointment today Past Medical History:  Diagnosis Date   Acid reflux    Cancer ([place]) 09/05/13   left neck-non-hodgkins lymphoma   Hypertension    requires diuretic   Hypothyroidism    Malaria    while in the service   Peripheral vascular disease ([place])    slow wound healing on legs   Past Surgical History:  Procedure Laterality Date   AMPUTATION Left 06/25/2015   Procedure: LEFT  HALLUX AMPUTATION,;  Surgeon: Wylene Simmer, MD;  Location: [facility];  Service: Orthopedics;  Laterality: Left;  LOCAL/MAC   CHOLECYSTECTOMY N/A 11/08/2018   Procedure: LAPAROSCOPIC CHOLECYSTECTOMY WITH INTRAOPERATIVE CHOLANGIOGRAM;  Surgeon: [name], MD;  Location: [facility];  Service: General;   Laterality: N/A;   MASS BIOPSY Left 09/05/2013   Procedure: EXCISIONAL BIOPSY OF LEFT NECK MASS;  Surgeon: Jerrell Belfast, MD;  Location: [facility];  Service: ENT;  Laterality: Left;   PACEMAKER IMPLANT N/A 05/28/2020   Procedure: PACEMAKER IMPLANT;  Surgeon: Thompson Grayer, MD;  Location: [facility];  Service: Cardiovascular;  Laterality: N/A;   TONSILLECTOMY     YAG LASER APPLICATION Right 2/35/5732   Procedure: YAG LASER APPLICATION;  Surgeon: [name], MD;  Location: [facility];  Service: Ophthalmology;  Laterality: Right;   Current Outpatient Medications on File Prior to Visit  Medication Sig Dispense Refill   amLODipine (NORVASC) 5 MG tablet TAKE 1 TABLET BY MOUTH  DAILY 90 tablet 3   aspirin EC 81 MG tablet Take 81 mg by mouth daily. Swallow whole.     betamethasone dipropionate 0.05 % cream Apply topically 2 (two) times daily. Apply small amount to red areas on legs twice daily. 45 g 1   diphenhydramine-acetaminophen (TYLENOL PM) 25-500 MG TABS tablet Take 1 tablet by mouth at bedtime as needed.     enalapril (VASOTEC) 20 MG tablet TAKE 1 TABLET BY MOUTH  DAILY 90 tablet 3   escitalopram (LEXAPRO) 10 MG tablet TAKE 1 TABLET BY MOUTH EVERY DAY 90 tablet 2   furosemide (LASIX) 20 MG tablet TAKE 2 TABLETS BY MOUTH  DAILY 180 tablet 3   levothyroxine (SYNTHROID)  112 MCG tablet TAKE 1 TABLET BY MOUTH  DAILY 90 tablet 3   NONFORMULARY OR COMPOUNDED ITEM See pharmacy note (Patient taking differently: as needed. Compound Mixture for feet) 120 each 2   pregabalin (LYRICA) 25 MG capsule Take 25 mg by mouth 3 (three) times daily.     Rivaroxaban (XARELTO) 15 MG TABS tablet Take 1 tablet (15 mg total) by mouth daily with supper. 90 tablet 3   triamcinolone (KENALOG) 0.1 % Apply 1 application topically 3 (three) times daily. 30 g 1   vitamin B-12 (CYANOCOBALAMIN) 1000 MCG tablet Take 1 tablet (1,000 mcg total) by mouth daily. 30 tablet 5   No current facility-administered medications on file  prior to visit.   Allergies  Allergen Reactions   Codeine Nausea And Vomiting   Percocet [Oxycodone-Acetaminophen] Nausea And Vomiting   Social History   Socioeconomic History   Marital status: Widowed    Spouse name: Not on file   Number of children: 4   Years of education: Not on file   Highest education level: Not on file  Occupational History   Occupation: retired  Tobacco Use   Smoking status: Former    Types: Cigars   Smokeless tobacco: Former    Quit date: 10/05/1979  Substance and Sexual Activity   Alcohol use: No   Drug use: No   Sexual activity: Not Currently  Other Topics Concern   Not on file  Social History Narrative   Children all live nearby and rotate staying with patient at night.    Wife died in January 21, 2020, dementia, age 44.   Social Determinants of Health   Financial Resource Strain: Low Risk    Difficulty of Paying Living Expenses: Not hard at all  Food Insecurity: No Food Insecurity   Worried About Charity fundraiser in the Last Year: Never true   [place] in the Last Year: Never true  Transportation Needs: No Transportation Needs   Lack of Transportation (Medical): No   Lack of Transportation (Non-Medical): No  Physical Activity: Insufficiently Active   Days of Exercise per Week: 3 days   Minutes of Exercise per Session: 20 min  Stress: No Stress Concern Present   Feeling of Stress : Not at all  Social Connections: Socially Isolated   Frequency of Communication with Friends and Family: More than three times a week   Frequency of Social Gatherings with Friends and Family: More than three times a week   Attends Religious Services: Never   Marine scientist or Organizations: No   Attends Archivist Meetings: Never   Marital Status: Widowed  Human resources officer Violence: Not At Risk   Fear of Current or Ex-Partner: No   Emotionally Abused: No   Physically Abused: No   Sexually Abused: No      Review of Systems  All other  systems reviewed and are negative.     Objective:   Physical Exam Vitals reviewed.  Constitutional:      General: He is not in acute distress.    Appearance: He is well-developed. He is not diaphoretic.  Cardiovascular:     Rate and Rhythm: Normal rate and regular rhythm.     Heart sounds: Normal heart sounds. No murmur heard.   No friction rub. No gallop.  Pulmonary:     Effort: Pulmonary effort is normal. No respiratory distress.     Breath sounds: Normal breath sounds. No stridor. No wheezing, rhonchi or rales.  Chest:  Chest wall: No tenderness.  Musculoskeletal:     Lumbar back: No spasms, tenderness or bony tenderness. Normal range of motion.     Right hip: No tenderness. Normal range of motion. Decreased strength.     Left hip: No tenderness. Normal range of motion. Decreased strength.     Right lower leg: Edema present.  Skin:    Findings: Bruising present. No erythema.  Neurological:     Motor: No weakness.     Coordination: Coordination normal.     Gait: Gait abnormal.          Assessment & Plan:  Localized swelling of right lower leg Patient has significant bruising from above his right knee all the way to his ankle.  There is a greenish-yellowish hue and discoloration to the leg leading me to believe that he has significant subcutaneous bleeding.  However this occurred 3 weeks ago and this appears to be old.  There is no active sign of ongoing bleeding.  There is no sign of compartment syndrome.  There is no sign of a fracture.  He has no pain with flexion and extension of his knee.  Therefore I believe that this is likely edema and swelling secondary to bruising and third spacing and venous insufficiency.  We discussed getting an ultrasound to rule out a DVT.  However together we have decided to forego that as this is happened to the patient before.  Instead we placed the patient in an [place] boot and we will recheck the patient in 4 days.  I recommended that he  keep his leg elevated and to avoid aspirin.  Recheck immediately if worsening

## 2021-12-08 NOTE — Progress Notes (Signed)
Remote pacemaker transmission.   

## 2021-12-10 ENCOUNTER — Ambulatory Visit (INDEPENDENT_AMBULATORY_CARE_PROVIDER_SITE_OTHER): Payer: Medicare Other | Admitting: Family Medicine

## 2021-12-10 ENCOUNTER — Other Ambulatory Visit: Payer: Self-pay

## 2021-12-10 ENCOUNTER — Encounter: Payer: Self-pay | Admitting: Family Medicine

## 2021-12-10 VITALS — BP 120/60 | HR 67 | Temp 97.5°F | Resp 14 | Ht 72.0 in | Wt 210.0 lb

## 2021-12-10 DIAGNOSIS — R2241 Localized swelling, mass and lump, right lower limb: Secondary | ICD-10-CM

## 2021-12-10 MED ORDER — MOMETASONE FUROATE 0.1 % EX CREA: TOPICAL_CREAM | CUTANEOUS | 1 refills | Status: DC

## 2021-12-10 NOTE — Progress Notes (Signed)
Subjective:    Patient ID: [name], male    DOB: Feb 12, 1919, 88 y.o.   MRN: [id number] 12/06/21 Patient is a very pleasant 88 year old Caucasian gentleman who contracted COVID around December 23.  Roughly around the same time, he lost his balance and fell in his home.  He twisted his right knee as he fell and he landed directly on his patella.  He presents today with swelling in his leg.  There is significant bruising above his knee.  This bruising is yellow and brown indicating an old bruise.  There is palpable edema around the knee and anterior to the patella suggesting swelling from the contusion.  There is also swelling in his right hamstring.  The swelling is subcutaneous.  The leg has a brownish-greenish hue leading me to believe that the swelling that is subcutaneous is likely blood there is a dried hematoma.  There is also a bruise on his anterior right shin that is bright purple and roughly the diameter of a baseball.  He has palpable dorsalis pedis and posterior tibialis pulses.  He denies any pain in the leg.  He is able to wiggle his toes and his ankles without any pain.  He has been walking putting weight on his leg without any pain however the swelling is concerning him causing him to make the appointment today.  At that time, my plan was:  Patient has significant bruising from above his right knee all the way to his ankle.  There is a greenish-yellowish hue and discoloration to the leg leading me to believe that he has significant subcutaneous bleeding.  However this occurred 3 weeks ago and this appears to be old.  There is no active sign of ongoing bleeding.  There is no sign of compartment syndrome.  There is no sign of a fracture.  He has no pain with flexion and extension of his knee.  Therefore I believe that this is likely edema and swelling secondary to bruising and third spacing and venous insufficiency.  We discussed getting an ultrasound to rule out a DVT.  However together we  have decided to forego that as this is happened to the patient before.  Instead we placed the patient in an Unna boot and we will recheck the patient in 4 days.  I recommended that he keep his leg elevated and to avoid aspirin.  Recheck immediately if worsening  12/10/21 Swelling has improved substantially with Unna boot.  There is still brownish discoloration superior medial to the kneecap of an old bruise.  There is still some hyperpigmentation in the skin distal to the knee consistent with bruising and old blood.  However the palpable edema and swelling is at least 50% better.  The pain is 50% better.  He does have an itchy eczema-like rash on his left forearm that is itching Past Medical History:  Diagnosis Date   Acid reflux    Cancer ([place]) 09/05/13   left neck-non-hodgkins lymphoma   Hypertension    requires diuretic   Hypothyroidism    Malaria    while in the service   Peripheral vascular disease ([place])    slow wound healing on legs   Past Surgical History:  Procedure Laterality Date   AMPUTATION Left 06/25/2015   Procedure: LEFT  HALLUX AMPUTATION,;  Surgeon: Wylene Simmer, MD;  Location: [facility];  Service: Orthopedics;  Laterality: Left;  LOCAL/MAC   CHOLECYSTECTOMY N/A 11/08/2018   Procedure: LAPAROSCOPIC CHOLECYSTECTOMY WITH INTRAOPERATIVE CHOLANGIOGRAM;  Surgeon: [name], MD;  Location: [facility];  Service: General;  Laterality: N/A;   MASS BIOPSY Left 09/05/2013   Procedure: EXCISIONAL BIOPSY OF LEFT NECK MASS;  Surgeon: Jerrell Belfast, MD;  Location: [facility];  Service: ENT;  Laterality: Left;   PACEMAKER IMPLANT N/A 05/28/2020   Procedure: PACEMAKER IMPLANT;  Surgeon: Thompson Grayer, MD;  Location: [facility];  Service: Cardiovascular;  Laterality: N/A;   TONSILLECTOMY     YAG LASER APPLICATION Right 6/76/1950   Procedure: YAG LASER APPLICATION;  Surgeon: [name], MD;  Location: [facility];  Service: Ophthalmology;  Laterality: Right;   Current  Outpatient Medications on File Prior to Visit  Medication Sig Dispense Refill   amLODipine (NORVASC) 5 MG tablet TAKE 1 TABLET BY MOUTH  DAILY 90 tablet 3   aspirin EC 81 MG tablet Take 81 mg by mouth daily. Swallow whole.     betamethasone dipropionate 0.05 % cream Apply topically 2 (two) times daily. Apply small amount to red areas on legs twice daily. 45 g 1   diphenhydramine-acetaminophen (TYLENOL PM) 25-500 MG TABS tablet Take 1 tablet by mouth at bedtime as needed.     enalapril (VASOTEC) 20 MG tablet TAKE 1 TABLET BY MOUTH  DAILY 90 tablet 3   escitalopram (LEXAPRO) 10 MG tablet TAKE 1 TABLET BY MOUTH EVERY DAY 90 tablet 2   furosemide (LASIX) 20 MG tablet TAKE 2 TABLETS BY MOUTH  DAILY 180 tablet 3   levothyroxine (SYNTHROID) 112 MCG tablet TAKE 1 TABLET BY MOUTH  DAILY 90 tablet 3   NONFORMULARY OR COMPOUNDED ITEM See pharmacy note (Patient taking differently: as needed. Compound Mixture for feet) 120 each 2   pregabalin (LYRICA) 25 MG capsule Take 25 mg by mouth 3 (three) times daily.     Rivaroxaban (XARELTO) 15 MG TABS tablet Take 1 tablet (15 mg total) by mouth daily with supper. 90 tablet 3   triamcinolone (KENALOG) 0.1 % Apply 1 application topically 3 (three) times daily. 30 g 1   vitamin B-12 (CYANOCOBALAMIN) 1000 MCG tablet Take 1 tablet (1,000 mcg total) by mouth daily. 30 tablet 5   No current facility-administered medications on file prior to visit.   Allergies  Allergen Reactions   Codeine Nausea And Vomiting   Percocet [Oxycodone-Acetaminophen] Nausea And Vomiting   Social History   Socioeconomic History   Marital status: Widowed    Spouse name: Not on file   Number of children: 4   Years of education: Not on file   Highest education level: Not on file  Occupational History   Occupation: retired  Tobacco Use   Smoking status: Former    Types: Cigars   Smokeless tobacco: Former    Quit date: 10/05/1979  Substance and Sexual Activity   Alcohol use: No    Drug use: No   Sexual activity: Not Currently  Other Topics Concern   Not on file  Social History Narrative   Children all live nearby and rotate staying with patient at night.    Wife died in 31-Jan-2020, dementia, age 27.   Social Determinants of Health   Financial Resource Strain: Low Risk    Difficulty of Paying Living Expenses: Not hard at all  Food Insecurity: No Food Insecurity   Worried About Charity fundraiser in the Last Year: Never true   [place] in the Last Year: Never true  Transportation Needs: No Transportation Needs   Lack of Transportation (Medical): No  Lack of Transportation (Non-Medical): No  Physical Activity: Insufficiently Active   Days of Exercise per Week: 3 days   Minutes of Exercise per Session: 20 min  Stress: No Stress Concern Present   Feeling of Stress : Not at all  Social Connections: Socially Isolated   Frequency of Communication with Friends and Family: More than three times a week   Frequency of Social Gatherings with Friends and Family: More than three times a week   Attends Religious Services: Never   Marine scientist or Organizations: No   Attends Archivist Meetings: Never   Marital Status: Widowed  Human resources officer Violence: Not At Risk   Fear of Current or Ex-Partner: No   Emotionally Abused: No   Physically Abused: No   Sexually Abused: No      Review of Systems  All other systems reviewed and are negative.     Objective:   Physical Exam Vitals reviewed.  Constitutional:      General: He is not in acute distress.    Appearance: He is well-developed. He is not diaphoretic.  Cardiovascular:     Rate and Rhythm: Normal rate and regular rhythm.     Heart sounds: Normal heart sounds. No murmur heard.   No friction rub. No gallop.  Pulmonary:     Effort: Pulmonary effort is normal. No respiratory distress.     Breath sounds: Normal breath sounds. No stridor. No wheezing, rhonchi or rales.  Chest:     Chest  wall: No tenderness.  Musculoskeletal:     Lumbar back: No spasms, tenderness or bony tenderness. Normal range of motion.     Right hip: No tenderness. Normal range of motion. Decreased strength.     Left hip: No tenderness. Normal range of motion. Decreased strength.     Right lower leg: Edema present.  Skin:    Findings: Bruising present. No erythema.  Neurological:     Motor: No weakness.     Coordination: Coordination normal.     Gait: Gait abnormal.          Assessment & Plan:  Localized swelling of right lower leg Swelling is markedly better.  I replaced the Unna boot today and will continue this through Tuesday of next week and then discontinue.  Hopefully at that time we can allow it to heal on its own.  I will treat the rash on his left arm like eczema with Elocon cream.

## 2021-12-24 ENCOUNTER — Ambulatory Visit (INDEPENDENT_AMBULATORY_CARE_PROVIDER_SITE_OTHER): Payer: Medicare Other | Admitting: Family Medicine

## 2021-12-24 ENCOUNTER — Other Ambulatory Visit: Payer: Self-pay

## 2021-12-24 ENCOUNTER — Encounter: Payer: Self-pay | Admitting: Family Medicine

## 2021-12-24 ENCOUNTER — Other Ambulatory Visit: Payer: Self-pay | Admitting: Family Medicine

## 2021-12-24 VITALS — BP 98/62 | Temp 97.8°F | Resp 17 | Ht 72.0 in | Wt 210.0 lb

## 2021-12-24 DIAGNOSIS — L98491 Non-pressure chronic ulcer of skin of other sites limited to breakdown of skin: Secondary | ICD-10-CM | POA: Diagnosis not present

## 2021-12-24 DIAGNOSIS — L03115 Cellulitis of right lower limb: Secondary | ICD-10-CM

## 2021-12-24 DIAGNOSIS — S81801A Unspecified open wound, right lower leg, initial encounter: Secondary | ICD-10-CM

## 2021-12-24 MED ORDER — SILVER SULFADIAZINE 1 % EX CREA: 1.0000 "application " | TOPICAL_CREAM | Freq: Every day | CUTANEOUS | 0 refills | Status: DC

## 2021-12-24 MED ORDER — CEPHALEXIN 500 MG PO CAPS: 500.0000 mg | ORAL_CAPSULE | Freq: Three times a day (TID) | ORAL | 0 refills | Status: DC

## 2021-12-24 NOTE — Telephone Encounter (Signed)
Lyrica refill request.  Last seen 12/24/2021, last filled 09/05/2021.

## 2021-12-24 NOTE — Progress Notes (Signed)
Subjective:    Patient ID: [name], male    DOB: 1919-09-29, 88 y.o.   MRN: [id number]  After the family removed the Unna boot from his last visit, they noticed sores on his foot.  On his anterior ankle, there are 2 ulcers.  Each is about 3 cm x 2 cm.  There is a honey like impetigo crust around the crater of each ulcer.  The base shows healthy granulation tissue.  Over the head of the fifth metatarsal, there is a black shallow ulcer.  This is the diameter of a nickel.  The skin breakdown is very shallow only 1 mm.  However it appears to be dry gangrene.  The skin split open on his posterior ankle vertically following the Achilles tendon.  On palpation today this is very painful and purulent material is oozing from the split.  The surrounding skin is erythematous and appears to be infected.  I am concerned that the ulcers appear to be arterial insufficiency ulcers likely due to peripheral vascular disease possibly exacerbated by the last Unna boot.  The other possibility would be friction ulcers.  His blood pressure today is extremely low.  However otherwise he is doing well Past Medical History:  Diagnosis Date   Acid reflux    Cancer ([place]) 09/05/13   left neck-non-hodgkins lymphoma   Hypertension    requires diuretic   Hypothyroidism    Malaria    while in the service   Peripheral vascular disease ([place])    slow wound healing on legs   Past Surgical History:  Procedure Laterality Date   AMPUTATION Left 06/25/2015   Procedure: LEFT  HALLUX AMPUTATION,;  Surgeon: Wylene Simmer, MD;  Location: [facility];  Service: Orthopedics;  Laterality: Left;  LOCAL/MAC   CHOLECYSTECTOMY N/A 11/08/2018   Procedure: LAPAROSCOPIC CHOLECYSTECTOMY WITH INTRAOPERATIVE CHOLANGIOGRAM;  Surgeon: [name], MD;  [facility];  Service: General;  Laterality: N/A;   MASS BIOPSY Left 09/05/2013   Procedure: EXCISIONAL BIOPSY OF LEFT NECK MASS;  Surgeon: Jerrell Belfast, MD;  Location: [facility];   Service: ENT;  Laterality: Left;   PACEMAKER IMPLANT N/A 05/28/2020   Procedure: PACEMAKER IMPLANT;  Surgeon: Thompson Grayer, MD;  Location: [facility];  Service: Cardiovascular;  Laterality: N/A;   TONSILLECTOMY     YAG LASER APPLICATION Right 6/38/1771   Procedure: YAG LASER APPLICATION;  Surgeon: [name], MD;  [facility];  Service: Ophthalmology;  Laterality: Right;   Current Outpatient Medications on File Prior to Visit  Medication Sig Dispense Refill   amLODipine (NORVASC) 5 MG tablet TAKE 1 TABLET BY MOUTH  DAILY 90 tablet 3   aspirin EC 81 MG tablet Take 81 mg by mouth daily. Swallow whole.     betamethasone dipropionate 0.05 % cream Apply topically 2 (two) times daily. Apply small amount to red areas on legs twice daily. 45 g 1   diphenhydramine-acetaminophen (TYLENOL PM) 25-500 MG TABS tablet Take 1 tablet by mouth at bedtime as needed.     enalapril (VASOTEC) 20 MG tablet TAKE 1 TABLET BY MOUTH  DAILY 90 tablet 3   escitalopram (LEXAPRO) 10 MG tablet TAKE 1 TABLET BY MOUTH EVERY DAY 90 tablet 2   furosemide (LASIX) 20 MG tablet TAKE 2 TABLETS BY MOUTH  DAILY 180 tablet 3   levothyroxine (SYNTHROID) 112 MCG tablet TAKE 1 TABLET BY MOUTH  DAILY 90 tablet 3   mometasone (ELOCON) 0.1 % cream USE AS DIRECTED 45  g 1   NONFORMULARY OR COMPOUNDED ITEM See pharmacy note (Patient taking differently: as needed. Compound Mixture for feet) 120 each 2   pregabalin (LYRICA) 25 MG capsule Take 25 mg by mouth 3 (three) times daily.     Rivaroxaban (XARELTO) 15 MG TABS tablet Take 1 tablet (15 mg total) by mouth daily with supper. 90 tablet 3   triamcinolone (KENALOG) 0.1 % Apply 1 application topically 3 (three) times daily. 30 g 1   vitamin B-12 (CYANOCOBALAMIN) 1000 MCG tablet Take 1 tablet (1,000 mcg total) by mouth daily. 30 tablet 5   No current facility-administered medications on file prior to visit.   Allergies  Allergen Reactions   Codeine Nausea And Vomiting   Percocet  [Oxycodone-Acetaminophen] Nausea And Vomiting   Social History   Socioeconomic History   Marital status: Widowed    Spouse name: Not on file   Number of children: 4   Years of education: Not on file   Highest education level: Not on file  Occupational History   Occupation: retired  Tobacco Use   Smoking status: Former    Types: Cigars   Smokeless tobacco: Former    Quit date: 10/05/1979  Substance and Sexual Activity   Alcohol use: No   Drug use: No   Sexual activity: Not Currently  Other Topics Concern   Not on file  Social History Narrative   Children all live nearby and rotate staying with patient at night.    Wife died in 2020-02-03, dementia, age 55.   Social Determinants of Health   Financial Resource Strain: Low Risk    Difficulty of Paying Living Expenses: Not hard at all  Food Insecurity: No Food Insecurity   Worried About Charity fundraiser in the Last Year: Never true   [place] in the Last Year: Never true  Transportation Needs: No Transportation Needs   Lack of Transportation (Medical): No   Lack of Transportation (Non-Medical): No  Physical Activity: Insufficiently Active   Days of Exercise per Week: 3 days   Minutes of Exercise per Session: 20 min  Stress: No Stress Concern Present   Feeling of Stress : Not at all  Social Connections: Socially Isolated   Frequency of Communication with Friends and Family: More than three times a week   Frequency of Social Gatherings with Friends and Family: More than three times a week   Attends Religious Services: Never   Marine scientist or Organizations: No   Attends Archivist Meetings: Never   Marital Status: Widowed  Human resources officer Violence: Not At Risk   Fear of Current or Ex-Partner: No   Emotionally Abused: No   Physically Abused: No   Sexually Abused: No      Review of Systems  All other systems reviewed and are negative.     Objective:   Physical Exam Vitals reviewed.   Constitutional:      General: He is not in acute distress.    Appearance: He is well-developed. He is not diaphoretic.  Cardiovascular:     Rate and Rhythm: Normal rate and regular rhythm.     Pulses:          Dorsalis pedis pulses are 1+ on the right side.       Posterior tibial pulses are 1+ on the right side.     Heart sounds: Normal heart sounds. No murmur heard.   No friction rub. No gallop.  Pulmonary:  Effort: Pulmonary effort is normal. No respiratory distress.     Breath sounds: Normal breath sounds. No stridor. No wheezing, rhonchi or rales.  Chest:     Chest wall: No tenderness.  Musculoskeletal:     Lumbar back: No spasms, tenderness or bony tenderness. Normal range of motion.     Right hip: No tenderness. Normal range of motion. Decreased strength.     Left hip: No tenderness. Normal range of motion. Decreased strength.     Right lower leg: Edema present.       Feet:  Feet:     Right foot:     Skin integrity: Ulcer, skin breakdown, erythema, warmth and fissure present.  Skin:    Findings: Bruising present. No erythema.  Neurological:     Motor: No weakness.     Coordination: Coordination normal.     Gait: Gait abnormal.          Assessment & Plan:  Wound of right lower extremity, initial encounter - Plan: CBC with Differential/Platelet, COMPLETE METABOLIC PANEL WITH GFR, Ambulatory referral to [place], VAS [place] ABI WITH/WO TBI  Ischemic ulcer, limited to breakdown of skin ([place]) - Plan: Ambulatory referral to [place], VAS [place] ABI WITH/WO TBI  Cellulitis of right lower extremity - Plan: VAS [place] ABI WITH/WO TBI Believe the wound on his posterior ankle has gotten infected.  Begin Keflex 500 mg p.o. 3 times daily for 7 days and recheck next week on Tuesday.  I covered each of the wounds with Silvadene, nonadherent gauze, and then wrapping with Coban.  I will consult home health nursing and wound care.  Recommended daily changes using Silvadene and  nonadherent gauze.  Obtain vascular ultrasound with ABI to evaluate for arterial insufficiency in the right leg.  The wound over the fifth metatarsal appears to be dry gangrene.  It is limited to breakdown of the skin.  Recheck on Tuesday for any progression.

## 2021-12-25 LAB — COMPLETE METABOLIC PANEL WITH GFR
AG Ratio: 1.3 (calc) (ref 1.0–2.5)
ALT: 4 U/L — ABNORMAL LOW (ref 9–46)
AST: 14 U/L (ref 10–35)
Albumin: 4 g/dL (ref 3.6–5.1)
Alkaline phosphatase (APISO): 63 U/L (ref 35–144)
BUN/Creatinine Ratio: 14 (calc) (ref 6–22)
BUN: 31 mg/dL — ABNORMAL HIGH (ref 7–25)
CO2: 30 mmol/L (ref 20–32)
Calcium: 9.6 mg/dL (ref 8.6–10.3)
Chloride: 105 mmol/L (ref 98–110)
Creat: 2.19 mg/dL — ABNORMAL HIGH (ref 0.70–1.22)
Globulin: 3 g/dL (calc) (ref 1.9–3.7)
Glucose, Bld: 80 mg/dL (ref 65–99)
Potassium: 4.9 mmol/L (ref 3.5–5.3)
Sodium: 143 mmol/L (ref 135–146)
Total Bilirubin: 0.5 mg/dL (ref 0.2–1.2)
Total Protein: 7 g/dL (ref 6.1–8.1)
eGFR: 26 mL/min/{1.73_m2} — ABNORMAL LOW (ref >=60)

## 2021-12-25 LAB — CBC WITH DIFFERENTIAL/PLATELET
Absolute Monocytes: 863 cells/uL (ref 200–950)
Basophils Absolute: 53 cells/uL (ref 0–200)
Basophils Relative: 0.7 %
Eosinophils Absolute: 120 cells/uL (ref 15–500)
Eosinophils Relative: 1.6 %
HCT: 37.2 % — ABNORMAL LOW (ref 38.5–50.0)
Hemoglobin: 12.4 g/dL — ABNORMAL LOW (ref 13.2–17.1)
Lymphs Abs: 2378 cells/uL (ref 850–3900)
MCH: 31 pg (ref 27.0–33.0)
MCHC: 33.3 g/dL (ref 32.0–36.0)
MCV: 93 fL (ref 80.0–100.0)
MPV: 10.9 fL (ref 7.5–12.5)
Monocytes Relative: 11.5 %
Neutro Abs: 4088 cells/uL (ref 1500–7800)
Neutrophils Relative %: 54.5 %
Platelets: 176 10*3/uL (ref 140–400)
RBC: 4 10*6/uL — ABNORMAL LOW (ref 4.20–5.80)
RDW: 12.1 % (ref 11.0–15.0)
Total Lymphocyte: 31.7 %
WBC: 7.5 10*3/uL (ref 3.8–10.8)

## 2021-12-27 ENCOUNTER — Telehealth: Payer: Self-pay

## 2021-12-27 NOTE — Telephone Encounter (Signed)
Vascular and Vein Specialist called in about a referral for this pt that is scheduled tomorrow (12/28/21), and was needing some pre-certification info for referral. Please advise.  CB#: Vascular and Vein [phone]

## 2021-12-28 ENCOUNTER — Ambulatory Visit (HOSPITAL_COMMUNITY): Admission: RE | Admit: 2021-12-28 | Payer: Medicare Other | Source: Ambulatory Visit

## 2021-12-28 ENCOUNTER — Encounter: Payer: Self-pay | Admitting: Family Medicine

## 2021-12-28 ENCOUNTER — Other Ambulatory Visit: Payer: Self-pay

## 2021-12-28 ENCOUNTER — Ambulatory Visit (INDEPENDENT_AMBULATORY_CARE_PROVIDER_SITE_OTHER): Payer: Medicare Other | Admitting: Family Medicine

## 2021-12-28 VITALS — BP 118/72 | HR 74 | Temp 97.1°F | Resp 18 | Ht 72.0 in | Wt 210.0 lb

## 2021-12-28 DIAGNOSIS — L98491 Non-pressure chronic ulcer of skin of other sites limited to breakdown of skin: Secondary | ICD-10-CM

## 2021-12-28 DIAGNOSIS — S81801D Unspecified open wound, right lower leg, subsequent encounter: Secondary | ICD-10-CM

## 2021-12-28 DIAGNOSIS — N1832 Chronic kidney disease, stage 3b: Secondary | ICD-10-CM

## 2021-12-28 LAB — BASIC METABOLIC PANEL WITH GFR
BUN/Creatinine Ratio: 19 (calc) (ref 6–22)
BUN: 37 mg/dL — ABNORMAL HIGH (ref 7–25)
CO2: 29 mmol/L (ref 20–32)
Calcium: 9.6 mg/dL (ref 8.6–10.3)
Chloride: 102 mmol/L (ref 98–110)
Creat: 1.96 mg/dL — ABNORMAL HIGH (ref 0.70–1.22)
Glucose, Bld: 101 mg/dL — ABNORMAL HIGH (ref 65–99)
Potassium: 4.9 mmol/L (ref 3.5–5.3)
Sodium: 141 mmol/L (ref 135–146)
eGFR: 30 mL/min/{1.73_m2} — ABNORMAL LOW (ref >=60)

## 2021-12-28 NOTE — Progress Notes (Signed)
Subjective:    Patient ID: [name], male    DOB: Sep 29, 1919, 88 y.o.   MRN: [id number] 12/24/21 After the family removed the Unna boot from his last visit, they noticed sores on his foot.  On his anterior ankle, there are 2 ulcers.  Each is about 3 cm x 2 cm.  There is a honey like impetigo crust around the crater of each ulcer.  The base shows healthy granulation tissue.  Over the head of the fifth metatarsal, there is a black shallow ulcer.  This is the diameter of a nickel.  The skin breakdown is very shallow only 1 mm.  However it appears to be dry gangrene.  The skin split open on his posterior ankle vertically following the Achilles tendon.  On palpation today this is very painful and purulent material is oozing from the split.  The surrounding skin is erythematous and appears to be infected.  I am concerned that the ulcers appear to be arterial insufficiency ulcers likely due to peripheral vascular disease possibly exacerbated by the last Unna boot.  The other possibility would be friction ulcers.  His blood pressure today is extremely low.  However otherwise he is doing well. At that time, my plan was:  Believe the wound on his posterior ankle has gotten infected.  Begin Keflex 500 mg p.o. 3 times daily for 7 days and recheck next week on Tuesday.  I covered each of the wounds with Silvadene, nonadherent gauze, and then wrapping with Coban.  I will consult home health nursing and wound care.  Recommended daily changes using Silvadene and nonadherent gauze.  Obtain vascular ultrasound with ABI to evaluate for arterial insufficiency in the right leg.  The wound over the fifth metatarsal appears to be dry gangrene.  It is limited to breakdown of the skin.  Recheck on Tuesday for any progression.  12/28/21 On a positive note, the wounds on his anterior ankle look much better.  There is healthy granulation tissue in the base of each wound.  Each is soft and supple.  The honey colored crust is  gone.  The skin is no longer splitting open.  The peripheral edge of the wound is erythematous and healing with granulation tissue.  The wound on his posterior ankle is also much better.  The skin is no longer erythematous.  The wound itself is no longer covered in a honey like impetigo crust.  These 3 wounds look much better and I feel it is only a matter of time before they heal.  Unfortunately the wound on the side of the foot looks worse.  The black area that appears to be dry gangrene is about the size of a quarter now.  There is also a very superficial bulla that extends proximally along the lateral edge of the foot towards the heel. Past Medical History:  Diagnosis Date   Acid reflux    Cancer ([place]) 09/05/13   left neck-non-hodgkins lymphoma   Hypertension    requires diuretic   Hypothyroidism    Malaria    while in the service   Peripheral vascular disease ([place])    slow wound healing on legs   Past Surgical History:  Procedure Laterality Date   AMPUTATION Left 06/25/2015   Procedure: LEFT  HALLUX AMPUTATION,;  Surgeon: Wylene Simmer, MD;  Location: [place];  Service: Orthopedics;  Laterality: Left;  LOCAL/MAC   CHOLECYSTECTOMY N/A 11/08/2018   Procedure: LAPAROSCOPIC CHOLECYSTECTOMY WITH INTRAOPERATIVE CHOLANGIOGRAM;  Surgeon:  [name], MD;  Location: [facility];  Service: General;  Laterality: N/A;   MASS BIOPSY Left 09/05/2013   Procedure: EXCISIONAL BIOPSY OF LEFT NECK MASS;  Surgeon: Jerrell Belfast, MD;  Location: [place];  Service: ENT;  Laterality: Left;   PACEMAKER IMPLANT N/A 05/28/2020   Procedure: PACEMAKER IMPLANT;  Surgeon: Thompson Grayer, MD;  Location: [place];  Service: Cardiovascular;  Laterality: N/A;   TONSILLECTOMY     YAG LASER APPLICATION Right 1/77/9390   Procedure: YAG LASER APPLICATION;  Surgeon: [name], MD;  Location: [facility];  Service: Ophthalmology;  Laterality: Right;   Current Outpatient Medications on File Prior to Visit   Medication Sig Dispense Refill   amLODipine (NORVASC) 5 MG tablet TAKE 1 TABLET BY MOUTH  DAILY 90 tablet 3   aspirin EC 81 MG tablet Take 81 mg by mouth daily. Swallow whole.     betamethasone dipropionate 0.05 % cream Apply topically 2 (two) times daily. Apply small amount to red areas on legs twice daily. 45 g 1   cephALEXin (KEFLEX) 500 MG capsule Take 1 capsule (500 mg total) by mouth 3 (three) times daily. 21 capsule 0   diphenhydramine-acetaminophen (TYLENOL PM) 25-500 MG TABS tablet Take 1 tablet by mouth at bedtime as needed.     enalapril (VASOTEC) 20 MG tablet TAKE 1 TABLET BY MOUTH  DAILY 90 tablet 3   escitalopram (LEXAPRO) 10 MG tablet TAKE 1 TABLET BY MOUTH EVERY DAY 90 tablet 2   furosemide (LASIX) 20 MG tablet TAKE 2 TABLETS BY MOUTH  DAILY 180 tablet 3   levothyroxine (SYNTHROID) 112 MCG tablet TAKE 1 TABLET BY MOUTH  DAILY 90 tablet 3   mometasone (ELOCON) 0.1 % cream USE AS DIRECTED 45 g 1   NONFORMULARY OR COMPOUNDED ITEM See pharmacy note (Patient taking differently: as needed. Compound Mixture for feet) 120 each 2   pregabalin (LYRICA) 25 MG capsule TAKE 1 CAPSULE BY MOUTH IN  THE MORNING AND 2 CAPSULES  IN THE EVENING 270 capsule 2   Rivaroxaban (XARELTO) 15 MG TABS tablet Take 1 tablet (15 mg total) by mouth daily with supper. 90 tablet 3   silver sulfADIAZINE (SILVADENE) 1 % cream Apply 1 application topically daily. 50 g 0   triamcinolone (KENALOG) 0.1 % Apply 1 application topically 3 (three) times daily. 30 g 1   vitamin B-12 (CYANOCOBALAMIN) 1000 MCG tablet Take 1 tablet (1,000 mcg total) by mouth daily. 30 tablet 5   No current facility-administered medications on file prior to visit.   Allergies  Allergen Reactions   Codeine Nausea And Vomiting   Percocet [Oxycodone-Acetaminophen] Nausea And Vomiting   Social History   Socioeconomic History   Marital status: Widowed    Spouse name: Not on file   Number of children: 4   Years of education: Not on file    Highest education level: Not on file  Occupational History   Occupation: retired  Tobacco Use   Smoking status: Former    Types: Cigars   Smokeless tobacco: Former    Quit date: 10/05/1979  Substance and Sexual Activity   Alcohol use: No   Drug use: No   Sexual activity: Not Currently  Other Topics Concern   Not on file  Social History Narrative   Children all live nearby and rotate staying with patient at night.    Wife died in February 01, 2020, dementia, age 86.   Social Determinants of Health   Financial Resource Strain: Low Risk  Difficulty of Paying Living Expenses: Not hard at all  Food Insecurity: No Food Insecurity   Worried About [place] in the Last Year: Never true   Ran Out of Food in the Last Year: Never true  Transportation Needs: No Transportation Needs   Lack of Transportation (Medical): No   Lack of Transportation (Non-Medical): No  Physical Activity: Insufficiently Active   Days of Exercise per Week: 3 days   Minutes of Exercise per Session: 20 min  Stress: No Stress Concern Present   Feeling of Stress : Not at all  Social Connections: Socially Isolated   Frequency of Communication with Friends and Family: More than three times a week   Frequency of Social Gatherings with Friends and Family: More than three times a week   Attends Religious Services: Never   Marine scientist or Organizations: No   Attends Archivist Meetings: Never   Marital Status: Widowed  Human resources officer Violence: Not At Risk   Fear of Current or Ex-Partner: No   Emotionally Abused: No   Physically Abused: No   Sexually Abused: No      Review of Systems  All other systems reviewed and are negative.     Objective:   Physical Exam Vitals reviewed.  Constitutional:      General: He is not in acute distress.    Appearance: He is well-developed. He is not diaphoretic.  Cardiovascular:     Rate and Rhythm: Normal rate and regular rhythm.     Pulses:           Dorsalis pedis pulses are 1+ on the right side.       Posterior tibial pulses are 1+ on the right side.     Heart sounds: Normal heart sounds. No murmur heard.   No friction rub. No gallop.  Pulmonary:     Effort: Pulmonary effort is normal. No respiratory distress.     Breath sounds: Normal breath sounds. No stridor. No wheezing, rhonchi or rales.  Chest:     Chest wall: No tenderness.  Musculoskeletal:     Lumbar back: No spasms, tenderness or bony tenderness. Normal range of motion.     Right hip: No tenderness. Normal range of motion. Decreased strength.     Left hip: No tenderness. Normal range of motion. Decreased strength.     Right lower leg: Edema present.       Feet:  Feet:     Right foot:     Skin integrity: Ulcer, skin breakdown, erythema, warmth and fissure present.  Skin:    Findings: Bruising present. No erythema.  Neurological:     Motor: No weakness.     Coordination: Coordination normal.     Gait: Gait abnormal.          Assessment & Plan:  Stage 3b chronic kidney disease ([place]) - Plan: BASIC METABOLIC PANEL WITH GFR  Wound of right lower extremity, subsequent encounter  Ischemic ulcer, limited to breakdown of skin ([place]) Wounds on the anterior right ankle look much better.  There is healthy granulation tissue around the peripheral margins of the wound.  There is no evidence of cellulitis.  These are going to heal slowly with time.  The wound on the posterior ankle looks much better as well.  I am very pleased with how these have progressed.  I continue to apply Silvadene to the wound base on a daily basis cover them with nonadherent gauze and wrapped them with  gauze.  Try to keep the wounds moist.  There appears to be an ischemic ulcer that has developed over the fifth metatarsal.  Right now there is no skin breakdown.  This area will need to continue to monitor.  If dry gangrene develops, he will need surgical debridement.  Await the results of his arterial  Dopplers.  The remainder of the foot is neurovascularly intact

## 2021-12-29 ENCOUNTER — Encounter (HOSPITAL_COMMUNITY): Payer: Medicare Other

## 2021-12-31 ENCOUNTER — Telehealth: Payer: Self-pay | Admitting: Family Medicine

## 2021-12-31 ENCOUNTER — Other Ambulatory Visit: Payer: Self-pay

## 2021-12-31 ENCOUNTER — Ambulatory Visit (HOSPITAL_COMMUNITY)
Admission: RE | Admit: 2021-12-31 | Discharge: 2021-12-31 | Disposition: A | Discharge status: Home / Self Care | Payer: Medicare Other | Source: Ambulatory Visit | Attending: Family Medicine | Admitting: Family Medicine

## 2021-12-31 DIAGNOSIS — L03115 Cellulitis of right lower limb: Secondary | ICD-10-CM | POA: Insufficient documentation

## 2021-12-31 DIAGNOSIS — S81801A Unspecified open wound, right lower leg, initial encounter: Secondary | ICD-10-CM | POA: Insufficient documentation

## 2021-12-31 DIAGNOSIS — L98491 Non-pressure chronic ulcer of skin of other sites limited to breakdown of skin: Secondary | ICD-10-CM | POA: Diagnosis not present

## 2021-12-31 NOTE — Telephone Encounter (Signed)
Patient's daughter Vaughan Basta called to follow up on last appointment; still waiting for call from wound care nurse to set up care.  Also wants to know if patient needs a follow up visit.  Please advise at [phone] until 3pm. After that, please advise at [phone].

## 2021-12-31 NOTE — Telephone Encounter (Signed)
Advised pt's daughter, Vaughan Basta, that follow up would likely be determined once dopplers have been resulted, those are scheduled for this afternoon. Also advised I would contact referral coord to request update on [facility]. Message sent to Pilar Grammes for assistance.

## 2022-01-04 NOTE — Telephone Encounter (Signed)
Vaughan Basta - Please update on status of referral for wound care. Thank you!

## 2022-01-05 NOTE — Telephone Encounter (Signed)
Patient aware still working on facility.  Follow up appointment scheduled.

## 2022-01-10 ENCOUNTER — Other Ambulatory Visit: Payer: Self-pay

## 2022-01-10 ENCOUNTER — Ambulatory Visit (INDEPENDENT_AMBULATORY_CARE_PROVIDER_SITE_OTHER): Payer: Medicare Other | Admitting: Family Medicine

## 2022-01-10 VITALS — BP 118/62 | HR 75 | Temp 97.4°F | Ht 73.0 in | Wt 210.0 lb

## 2022-01-10 DIAGNOSIS — S81801D Unspecified open wound, right lower leg, subsequent encounter: Secondary | ICD-10-CM

## 2022-01-10 DIAGNOSIS — L98491 Non-pressure chronic ulcer of skin of other sites limited to breakdown of skin: Secondary | ICD-10-CM | POA: Diagnosis not present

## 2022-01-10 DIAGNOSIS — S81801A Unspecified open wound, right lower leg, initial encounter: Secondary | ICD-10-CM

## 2022-01-10 NOTE — Progress Notes (Signed)
Subjective:    Patient ID: [name], male    DOB: 1919-02-09, 88 y.o.   MRN: [id number] 12/24/21 After the family removed the Unna boot from his last visit, they noticed sores on his foot.  On his anterior ankle, there are 2 ulcers.  Each is about 3 cm x 2 cm.  There is a honey like impetigo crust around the crater of each ulcer.  The base shows healthy granulation tissue.  Over the head of the fifth metatarsal, there is a black shallow ulcer.  This is the diameter of a nickel.  The skin breakdown is very shallow only 1 mm.  However it appears to be dry gangrene.  The skin split open on his posterior ankle vertically following the Achilles tendon.  On palpation today this is very painful and purulent material is oozing from the split.  The surrounding skin is erythematous and appears to be infected.  I am concerned that the ulcers appear to be arterial insufficiency ulcers likely due to peripheral vascular disease possibly exacerbated by the last Unna boot.  The other possibility would be friction ulcers.  His blood pressure today is extremely low.  However otherwise he is doing well. At that time, my plan was:  Believe the wound on his posterior ankle has gotten infected.  Begin Keflex 500 mg p.o. 3 times daily for 7 days and recheck next week on Tuesday.  I covered each of the wounds with Silvadene, nonadherent gauze, and then wrapping with Coban.  I will consult home health nursing and wound care.  Recommended daily changes using Silvadene and nonadherent gauze.  Obtain vascular ultrasound with ABI to evaluate for arterial insufficiency in the right leg.  The wound over the fifth metatarsal appears to be dry gangrene.  It is limited to breakdown of the skin.  Recheck on Tuesday for any progression.  12/28/21 On a positive note, the wounds on his anterior ankle look much better.  There is healthy granulation tissue in the base of each wound.  Each is soft and supple.  The honey colored crust is  gone.  The skin is no longer splitting open.  The peripheral edge of the wound is erythematous and healing with granulation tissue.  The wound on his posterior ankle is also much better.  The skin is no longer erythematous.  The wound itself is no longer covered in a honey like impetigo crust.  These 3 wounds look much better and I feel it is only a matter of time before they heal.  Unfortunately the wound on the side of the foot looks worse.  The black area that appears to be dry gangrene is about the size of a quarter now.  There is also a very superficial bulla that extends proximally along the lateral edge of the foot towards the heel.  At that time, my plan was:  Wounds on the anterior right ankle look much better.  There is healthy granulation tissue around the peripheral margins of the wound.  There is no evidence of cellulitis.  These are going to heal slowly with time.  The wound on the posterior ankle looks much better as well.  I am very pleased with how these have progressed.  I continue to apply Silvadene to the wound base on a daily basis cover them with nonadherent gauze and wrapped them with gauze.  Try to keep the wounds moist.  There appears to be an ischemic ulcer that has developed over the  fifth metatarsal.  Right now there is no skin breakdown.  This area will need to continue to monitor.  If dry gangrene develops, he will need surgical debridement.  Await the results of his arterial Dopplers.  The remainder of the foot is neurovascularly intact  01/10/22  Fortunately, the 2 wounds on the anterior portion of his ankle are healing gradually.  They are much improved as compared to before.  There is no drainage or evidence of cellulitis.  The wound on the posterior ankle over the Achilles tendon has also improved..  There is no significant erythema or drainage or fluctuance.  However, not seen in the photograph above, is an area of necrosis over the head of the fifth metatarsal.  There is an  area roughly the size of a quarter that is black with thick hard necrotic tissue.  This extends up the body of the fifth metatarsal over the plantar aspect towards the midfoot.  It is roughly 4 cm in length.  Using a razor blade, I debrided a significant portion of the necrotic tissue from the distal portion of the wound down to the underlying granulation tissue.  The depth of the ulcer is approximately 3 to 4 mm.  I actually was able to elicit bleeding debriding some of the necrotic tissue.  I was unable to debride the proximal portion of the wound due to the bleeding.  This was resolved with pressure.  I covered the wound with Silvadene, nonadherent gauze, and wrapped with Coban.  After 10 minutes, I remove the pressure dressing and the bleeding can stop.  I then covered each of the 4 ulcers with Silvadene, nonadherent gauze, and wrapped the leg with an Ace wrap. Past Medical History:  Diagnosis Date   Acid reflux    Cancer ([place]) 09/05/13   left neck-non-hodgkins lymphoma   Hypertension    requires diuretic   Hypothyroidism    Malaria    while in the service   Peripheral vascular disease ([place])    slow wound healing on legs   Past Surgical History:  Procedure Laterality Date   AMPUTATION Left 06/25/2015   Procedure: LEFT  HALLUX AMPUTATION,;  Surgeon: Wylene Simmer, MD;  Location: [place];  Service: Orthopedics;  Laterality: Left;  LOCAL/MAC   CHOLECYSTECTOMY N/A 11/08/2018   Procedure: LAPAROSCOPIC CHOLECYSTECTOMY WITH INTRAOPERATIVE CHOLANGIOGRAM;  Surgeon: [name], MD;  [facility];  Service: General;  Laterality: N/A;   MASS BIOPSY Left 09/05/2013   Procedure: EXCISIONAL BIOPSY OF LEFT NECK MASS;  Surgeon: Jerrell Belfast, MD;  Location: [place];  Service: ENT;  Laterality: Left;   PACEMAKER IMPLANT N/A 05/28/2020   Procedure: PACEMAKER IMPLANT;  Surgeon: Thompson Grayer, MD;  Location: [place] [facility];  Service: Cardiovascular;  Laterality: N/A;   TONSILLECTOMY      YAG LASER APPLICATION Right 5/39/7673   Procedure: YAG LASER APPLICATION;  Surgeon: [name], MD;  Location: [facility];  Service: Ophthalmology;  Laterality: Right;   Current Outpatient Medications on File Prior to Visit  Medication Sig Dispense Refill   amLODipine (NORVASC) 5 MG tablet TAKE 1 TABLET BY MOUTH  DAILY 90 tablet 3   aspirin EC 81 MG tablet Take 81 mg by mouth daily. Swallow whole.     betamethasone dipropionate 0.05 % cream Apply topically 2 (two) times daily. Apply small amount to red areas on legs twice daily. 45 g 1   diphenhydramine-acetaminophen (TYLENOL PM) 25-500 MG TABS tablet Take 1 tablet by mouth at  bedtime as needed.     enalapril (VASOTEC) 20 MG tablet TAKE 1 TABLET BY MOUTH  DAILY 90 tablet 3   escitalopram (LEXAPRO) 10 MG tablet TAKE 1 TABLET BY MOUTH EVERY DAY 90 tablet 2   furosemide (LASIX) 20 MG tablet TAKE 2 TABLETS BY MOUTH  DAILY 180 tablet 3   levothyroxine (SYNTHROID) 112 MCG tablet TAKE 1 TABLET BY MOUTH  DAILY 90 tablet 3   mometasone (ELOCON) 0.1 % cream USE AS DIRECTED 45 g 1   NONFORMULARY OR COMPOUNDED ITEM See pharmacy note (Patient taking differently: as needed. Compound Mixture for feet) 120 each 2   pregabalin (LYRICA) 25 MG capsule TAKE 1 CAPSULE BY MOUTH IN  THE MORNING AND 2 CAPSULES  IN THE EVENING 270 capsule 2   Rivaroxaban (XARELTO) 15 MG TABS tablet Take 1 tablet (15 mg total) by mouth daily with supper. 90 tablet 3   silver sulfADIAZINE (SILVADENE) 1 % cream Apply 1 application topically daily. 50 g 0   triamcinolone (KENALOG) 0.1 % Apply 1 application topically 3 (three) times daily. 30 g 1   vitamin B-12 (CYANOCOBALAMIN) 1000 MCG tablet Take 1 tablet (1,000 mcg total) by mouth daily. 30 tablet 5   No current facility-administered medications on file prior to visit.   Allergies  Allergen Reactions   Codeine Nausea And Vomiting   Percocet [Oxycodone-Acetaminophen] Nausea And Vomiting   Social History   Socioeconomic History    Marital status: Widowed    Spouse name: Not on file   Number of children: 4   Years of education: Not on file   Highest education level: Not on file  Occupational History   Occupation: retired  Tobacco Use   Smoking status: Former    Types: Cigars   Smokeless tobacco: Former    Quit date: 10/05/1979  Substance and Sexual Activity   Alcohol use: No   Drug use: No   Sexual activity: Not Currently  Other Topics Concern   Not on file  Social History Narrative   Children all live nearby and rotate staying with patient at night.    Wife died in 01-14-20, dementia, age 30.   Social Determinants of Health   Financial Resource Strain: Low Risk    Difficulty of Paying Living Expenses: Not hard at all  Food Insecurity: No Food Insecurity   Worried About Charity fundraiser in the Last Year: Never true   [place] in the Last Year: Never true  Transportation Needs: No Transportation Needs   Lack of Transportation (Medical): No   Lack of Transportation (Non-Medical): No  Physical Activity: Insufficiently Active   Days of Exercise per Week: 3 days   Minutes of Exercise per Session: 20 min  Stress: No Stress Concern Present   Feeling of Stress : Not at all  Social Connections: Socially Isolated   Frequency of Communication with Friends and Family: More than three times a week   Frequency of Social Gatherings with Friends and Family: More than three times a week   Attends Religious Services: Never   Marine scientist or Organizations: No   Attends Archivist Meetings: Never   Marital Status: Widowed  Human resources officer Violence: Not At Risk   Fear of Current or Ex-Partner: No   Emotionally Abused: No   Physically Abused: No   Sexually Abused: No      Review of Systems  All other systems reviewed and are negative.     Objective:  Physical Exam Vitals reviewed.  Constitutional:      General: He is not in acute distress.    Appearance: He is well-developed.  He is not diaphoretic.  Cardiovascular:     Rate and Rhythm: Normal rate and regular rhythm.     Pulses:          Dorsalis pedis pulses are 1+ on the right side.       Posterior tibial pulses are 1+ on the right side.     Heart sounds: Normal heart sounds. No murmur heard.   No friction rub. No gallop.  Pulmonary:     Effort: Pulmonary effort is normal. No respiratory distress.     Breath sounds: Normal breath sounds. No stridor. No wheezing, rhonchi or rales.  Chest:     Chest wall: No tenderness.  Musculoskeletal:     Lumbar back: No spasms, tenderness or bony tenderness. Normal range of motion.     Right hip: No tenderness. Normal range of motion. Decreased strength.     Left hip: No tenderness. Normal range of motion. Decreased strength.     Right lower leg: Edema present.       Feet:  Feet:     Right foot:     Skin integrity: Ulcer, skin breakdown, erythema, warmth and fissure present.  Skin:    Findings: No bruising or erythema.  Neurological:     Motor: No weakness.     Coordination: Coordination normal.     Gait: Gait abnormal.          Assessment & Plan:  Wound of right lower extremity, subsequent encounter  Ischemic ulcer, limited to breakdown of skin ([facility])  Wound of right lower extremity, initial encounter The wounds on the anterior right ankle are healing well with no evidence of cellulitis or ischemia.  The wound on the posterior right ankle over the Achilles tendon is also healing gradually and slowly.  I anticipate that these will gradually heal over the next 4 to 6 weeks.  Continue to apply Silvadene daily to the affected areas, cover with nonadherent gauze, and wrapped with an Ace wrap to keep the wounds moist.  The lesion over the plantar aspect of the fifth metatarsal shows very superficial dry gangrene.  This was debrided using a razor blade down to viable tissue.  Depth of the wound was 2 to 3 mm.  Eventually, the patient began to bleed with debridement.   This was described in the history of present illness.  Therefore we discontinued debridement today.  Cover the wound with Silvadene, nonadherent gauze and wrapped with an Ace wrap.  Recheck in 1 week and continue to debride necrotic tissue to facilitate healing.  Arterial ultrasound shows moderate peripheral vascular disease.  Given his advanced age, as long as the wounds are healing, I would not refer the patient for intervention such as stenting.  This depends he is not obvious male is top right like bottom shelf on the fact that the wounds continue to heal

## 2022-01-17 ENCOUNTER — Other Ambulatory Visit: Payer: Self-pay

## 2022-01-17 ENCOUNTER — Ambulatory Visit (INDEPENDENT_AMBULATORY_CARE_PROVIDER_SITE_OTHER): Payer: Medicare Other | Admitting: Family Medicine

## 2022-01-17 ENCOUNTER — Encounter: Payer: Self-pay | Admitting: Family Medicine

## 2022-01-17 VITALS — BP 124/82 | HR 70 | Temp 97.2°F | Resp 18 | Ht 73.0 in | Wt 210.0 lb

## 2022-01-17 DIAGNOSIS — S81801D Unspecified open wound, right lower leg, subsequent encounter: Secondary | ICD-10-CM

## 2022-01-17 DIAGNOSIS — R29898 Other symptoms and signs involving the musculoskeletal system: Secondary | ICD-10-CM

## 2022-01-17 MED ORDER — SILVER SULFADIAZINE 1 % EX CREA: 1.0000 "application " | TOPICAL_CREAM | Freq: Every day | CUTANEOUS | 2 refills | Status: DC

## 2022-01-17 MED ORDER — CEPHALEXIN 500 MG PO CAPS
500.0000 mg | ORAL_CAPSULE | Freq: Three times a day (TID) | ORAL | 0 refills | Status: DC
Start: 2022-01-17 — End: 2022-02-24

## 2022-01-17 NOTE — Progress Notes (Addendum)
Subjective:    Patient ID: [name], male    DOB: 1919/01/01, 88 y.o.   MRN: [id number]  01/10/22  Fortunately, the 2 wounds on the anterior portion of his ankle are healing gradually.  They are much improved as compared to before.  There is no drainage or evidence of cellulitis.  The wound on the posterior ankle over the Achilles tendon has also improved..  There is no significant erythema or drainage or fluctuance.  However, not seen in the photograph above, is an area of necrosis over the head of the fifth metatarsal.  There is an area roughly the size of a quarter that is black with thick hard necrotic tissue.  This extends up the body of the fifth metatarsal over the plantar aspect towards the midfoot.  It is roughly 4 cm in length.  Using a razor blade, I debrided a significant portion of the necrotic tissue from the distal portion of the wound down to the underlying granulation tissue.  The depth of the ulcer is approximately 3 to 4 mm.  I actually was able to elicit bleeding debriding some of the necrotic tissue.  I was unable to debride the proximal portion of the wound due to the bleeding.  This was resolved with pressure.  I covered the wound with Silvadene, nonadherent gauze, and wrapped with Coban.  After 10 minutes, I remove the pressure dressing and the bleeding had stopped.  I then covered each of the 4 ulcers with Silvadene, nonadherent gauze, and wrapped the leg with an Ace wrap.  At that time, my plan was:  The wounds on the anterior right ankle are healing well with no evidence of cellulitis or ischemia.  The wound on the posterior right ankle over the Achilles tendon is also healing gradually and slowly.  I anticipate that these will gradually heal over the next 4 to 6 weeks.  Continue to apply Silvadene daily to the affected areas, cover with nonadherent gauze, and wrapped with an Ace wrap to keep the wounds moist.  The lesion over the plantar aspect of the fifth metatarsal shows  very superficial dry gangrene.  This was debrided using a razor blade down to viable tissue.  Depth of the wound was 2 to 3 mm.  Eventually, the patient began to bleed with debridement.  This was described in the history of present illness.  Therefore we discontinued debridement today.  Cover the wound with Silvadene, nonadherent gauze and wrapped with an Ace wrap.  Recheck in 1 week and continue to debride necrotic tissue to facilitate healing.  Arterial ultrasound shows moderate peripheral vascular disease.  Given his advanced age, as long as the wounds are healing, I would not refer the patient for intervention such as stenting.  This depends he is not obvious male is top right like bottom shelf on the fact that the wounds continue to heal  01/17/22 Wounds continue to heal well.  The inferior wound in the picture above has essentially closed.  The superior wound is much smaller in diameter.  There is healthy granulation tissue in the base.  The wound overlying the Achilles tendon is narrower and again has good granulation tissue.  Is very shallow.  There is no surrounding cellulitis.  There is no drainage or exudate.  The patient denies any pain.  The only concern is again the wound on the plantar aspect of the fifth MTP joint.  Today the portion of the wound that I debrided last time  shows good healthy white dermis closing in the ulcer.  There is granulation tissue in the base.  The proximal portion of the wound that I did not debride is starting to slough off.  There is no thick callus or dark black necrotic tissue.  Using a razor blade I tried to remove any residual necrotic tissue and was only able to remove a trace amount of superficial tissue before bleeding began. Past Medical History:  Diagnosis Date   Acid reflux    Cancer ([place]) 09/05/13   left neck-non-hodgkins lymphoma   Hypertension    requires diuretic   Hypothyroidism    Malaria    while in the service   Peripheral vascular disease  ([place])    slow wound healing on legs   Past Surgical History:  Procedure Laterality Date   AMPUTATION Left 06/25/2015   Procedure: LEFT  HALLUX AMPUTATION,;  Surgeon: Wylene Simmer, MD;  Location: [place];  Service: Orthopedics;  Laterality: Left;  LOCAL/MAC   CHOLECYSTECTOMY N/A 11/08/2018   Procedure: LAPAROSCOPIC CHOLECYSTECTOMY WITH INTRAOPERATIVE CHOLANGIOGRAM;  Surgeon: [name], MD;  Location[facility];  Service: General;  Laterality: N/A;   MASS BIOPSY Left 09/05/2013   Procedure: EXCISIONAL BIOPSY OF LEFT NECK MASS;  Surgeon: Jerrell Belfast, MD;  Location: [facility] [facility];  Service: ENT;  Laterality: Left;   PACEMAKER IMPLANT N/A 05/28/2020   Procedure: PACEMAKER IMPLANT;  Surgeon: Thompson Grayer, MD;  Location: [facility];  Service: Cardiovascular;  Laterality: N/A;   TONSILLECTOMY     YAG LASER APPLICATION Right 03/22/9562   Procedure: YAG LASER APPLICATION;  Surgeon: [name], MD;  [facility]: [facility];  Service: Ophthalmology;  Laterality: Right;   Current Outpatient Medications on File Prior to Visit  Medication Sig Dispense Refill   amLODipine (NORVASC) 5 MG tablet TAKE 1 TABLET BY MOUTH  DAILY 90 tablet 3   aspirin EC 81 MG tablet Take 81 mg by mouth daily. Swallow whole.     betamethasone dipropionate 0.05 % cream Apply topically 2 (two) times daily. Apply small amount to red areas on legs twice daily. 45 g 1   diphenhydramine-acetaminophen (TYLENOL PM) 25-500 MG TABS tablet Take 1 tablet by mouth at bedtime as needed.     enalapril (VASOTEC) 20 MG tablet TAKE 1 TABLET BY MOUTH  DAILY 90 tablet 3   escitalopram (LEXAPRO) 10 MG tablet TAKE 1 TABLET BY MOUTH EVERY DAY 90 tablet 2   furosemide (LASIX) 20 MG tablet TAKE 2 TABLETS BY MOUTH  DAILY 180 tablet 3   levothyroxine (SYNTHROID) 112 MCG tablet TAKE 1 TABLET BY MOUTH  DAILY 90 tablet 3   mometasone (ELOCON) 0.1 % cream USE AS DIRECTED 45 g 1   NONFORMULARY OR COMPOUNDED ITEM See pharmacy note  (Patient taking differently: as needed. Compound Mixture for feet) 120 each 2   pregabalin (LYRICA) 25 MG capsule TAKE 1 CAPSULE BY MOUTH IN  THE MORNING AND 2 CAPSULES  IN THE EVENING 270 capsule 2   Rivaroxaban (XARELTO) 15 MG TABS tablet Take 1 tablet (15 mg total) by mouth daily with supper. 90 tablet 3   silver sulfADIAZINE (SILVADENE) 1 % cream Apply 1 application topically daily. 50 g 0   triamcinolone (KENALOG) 0.1 % Apply 1 application topically 3 (three) times daily. 30 g 1   vitamin B-12 (CYANOCOBALAMIN) 1000 MCG tablet Take 1 tablet (1,000 mcg total) by mouth daily. 30 tablet 5   No current facility-administered medications on file prior to visit.  Allergies  Allergen Reactions   Codeine Nausea And Vomiting   Percocet [Oxycodone-Acetaminophen] Nausea And Vomiting   Social History   Socioeconomic History   Marital status: Widowed    Spouse name: Not on file   Number of children: 4   Years of education: Not on file   Highest education level: Not on file  Occupational History   Occupation: retired  Tobacco Use   Smoking status: Former    Types: Cigars   Smokeless tobacco: Former    Quit date: 10/05/1979  Substance and Sexual Activity   Alcohol use: No   Drug use: No   Sexual activity: Not Currently  Other Topics Concern   Not on file  Social History Narrative   Children all live nearby and rotate staying with patient at night.    Wife died in 01/12/2020, dementia, age 54.   Social Determinants of Health   Financial Resource Strain: Low Risk    Difficulty of Paying Living Expenses: Not hard at all  Food Insecurity: No Food Insecurity   Worried About Charity fundraiser in the Last Year: Never true   [place] in the Last Year: Never true  Transportation Needs: No Transportation Needs   Lack of Transportation (Medical): No   Lack of Transportation (Non-Medical): No  Physical Activity: Insufficiently Active   Days of Exercise per Week: 3  days   Minutes of Exercise per Session: 20 min  Stress: No Stress Concern Present   Feeling of Stress : Not at all  Social Connections: Socially Isolated   Frequency of Communication with Friends and Family: More than three times a week   Frequency of Social Gatherings with Friends and Family: More than three times a week   Attends Religious Services: Never   Marine scientist or Organizations: No   Attends Archivist Meetings: Never   Marital Status: Widowed  Human resources officer Violence: Not At Risk   Fear of Current or Ex-Partner: No   Emotionally Abused: No   Physically Abused: No   Sexually Abused: No      Review of Systems  All other systems reviewed and are negative.     Objective:   Physical Exam Vitals reviewed.  Constitutional:      General: He is not in acute distress.    Appearance: He is well-developed. He is not diaphoretic.  Cardiovascular:     Rate and Rhythm: Normal rate and regular rhythm.     Pulses:          Dorsalis pedis pulses are 1+ on the right side.       Posterior tibial pulses are 1+ on the right side.     Heart sounds: Normal heart sounds. No murmur heard.   No friction rub. No gallop.  Pulmonary:     Effort: Pulmonary effort is normal. No respiratory distress.     Breath sounds: Normal breath sounds. No stridor. No wheezing, rhonchi or rales.  Chest:     Chest wall: No tenderness.  Musculoskeletal:     Lumbar back: No spasms, tenderness or bony tenderness. Normal range of motion.     Right hip: No tenderness. Normal range of motion. Decreased strength.     Left hip: No tenderness. Normal range of motion. Decreased strength.     Right lower leg: No edema.       Feet:  Feet:     Right foot:     Skin integrity: Ulcer, skin breakdown  and fissure present. No erythema, warmth or callus.  Skin:    Findings: No bruising or erythema.  Neurological:     Motor: No weakness.     Coordination: Coordination normal.      Gait: Gait abnormal.          Assessment & Plan:  Wound of right lower extremity, subsequent encounter Wounds are healing well.  I anticipate 6 to 8 weeks before they will be totally healed through secondary intention.  Continue to recommend applying Silvadene cream to the wound base to keep the area moist and also to prevent any secondary infection.  I recommended keeping them covered to maintain moisture in the wound.  There is no residual necrotic tissue to debride today.  Recheck in 2 weeks or as needed.  I will send in a prescription for Silvadene cream.  Also sent in a prescription for Keflex with instructions on when to fill if he develops any evidence of a secondary cellulitis that they can start medication immediately before they come in to see me.  However at the present time there is no sign of any secondary infection.  The patient's daughter is a Marine scientist and is doing an exceptional job of caring for the wounds.

## 2022-01-20 DIAGNOSIS — H43813 Vitreous degeneration, bilateral: Secondary | ICD-10-CM | POA: Diagnosis not present

## 2022-01-20 DIAGNOSIS — H353212 Exudative age-related macular degeneration, right eye, with inactive choroidal neovascularization: Secondary | ICD-10-CM | POA: Diagnosis not present

## 2022-01-20 DIAGNOSIS — H31013 Macula scars of posterior pole (postinflammatory) (post-traumatic), bilateral: Secondary | ICD-10-CM | POA: Diagnosis not present

## 2022-01-20 DIAGNOSIS — H353124 Nonexudative age-related macular degeneration, left eye, advanced atrophic with subfoveal involvement: Secondary | ICD-10-CM | POA: Diagnosis not present

## 2022-02-22 ENCOUNTER — Telehealth: Payer: Self-pay

## 2022-02-22 NOTE — Telephone Encounter (Signed)
Pt's daughter called in stating that pt's wounds on foot were seeming to get better, but have now started to leak some fluid from them. Pt's daughter wanted to know if pcp would like to send in a for some more antibiotics or have pt come in to be seen. Please advise. ? ?Cb#: [name] May daughter [phone] ?

## 2022-02-24 ENCOUNTER — Ambulatory Visit (INDEPENDENT_AMBULATORY_CARE_PROVIDER_SITE_OTHER): Payer: Medicare Other | Admitting: Family Medicine

## 2022-02-24 VITALS — BP 132/68 | HR 69 | Temp 96.5°F | Ht 73.0 in

## 2022-02-24 DIAGNOSIS — L98491 Non-pressure chronic ulcer of skin of other sites limited to breakdown of skin: Secondary | ICD-10-CM | POA: Diagnosis not present

## 2022-02-24 MED ORDER — CEPHALEXIN 500 MG PO CAPS: 500.0000 mg | ORAL_CAPSULE | Freq: Three times a day (TID) | ORAL | 0 refills | Status: DC

## 2022-02-24 NOTE — Progress Notes (Signed)
? ?Subjective:  ? ? Patient ID: [name], male    DOB: August 14, 1919, 88 y.o.   MRN: [id number] ? ?01/10/22 ? ?Fortunately, the 2 wounds on the anterior portion of his ankle are healing gradually.  They are much improved as compared to before.  There is no drainage or evidence of cellulitis.  The wound on the posterior ankle over the Achilles tendon has also improved..  There is no significant erythema or drainage or fluctuance.  However, not seen in the photograph above, is an area of necrosis over the head of the fifth metatarsal.  There is an area roughly the size of a quarter that is black with thick hard necrotic tissue.  This extends up the body of the fifth metatarsal over the plantar aspect towards the midfoot.  It is roughly 4 cm in length.  Using a razor blade, I debrided a significant portion of the necrotic tissue from the distal portion of the wound down to the underlying granulation tissue.  The depth of the ulcer is approximately 3 to 4 mm.  I actually was able to elicit bleeding debriding some of the necrotic tissue.  I was unable to debride the proximal portion of the wound due to the bleeding.  This was resolved with pressure.  I covered the wound with Silvadene, nonadherent gauze, and wrapped with Coban.  After 10 minutes, I remove the pressure dressing and the bleeding had stopped.  I then covered each of the 4 ulcers with Silvadene, nonadherent gauze, and wrapped the leg with an Ace wrap.  At that time, my plan was: ? ?The wounds on the anterior right ankle are healing well with no evidence of cellulitis or ischemia.  The wound on the posterior right ankle over the Achilles tendon is also healing gradually and slowly.  I anticipate that these will gradually heal over the next 4 to 6 weeks.  Continue to apply Silvadene daily to the affected areas, cover with nonadherent gauze, and wrapped with an Ace wrap to keep the wounds moist.  The lesion over the plantar aspect of the fifth metatarsal shows  very superficial dry gangrene.  This was debrided using a razor blade down to viable tissue.  Depth of the wound was 2 to 3 mm.  Eventually, the patient began to bleed with debridement.  This was described in the history of present illness.  Therefore we discontinued debridement today.  Cover the wound with Silvadene, nonadherent gauze and wrapped with an Ace wrap.  Recheck in 1 week and continue to debride necrotic tissue to facilitate healing.  Arterial ultrasound shows moderate peripheral vascular disease.  Given his advanced age, as long as the wounds are healing, I would not refer the patient for intervention such as stenting.  This depends he is not obvious male is top right like bottom shelf on the fact that the wounds continue to heal ? ?01/17/22 ?Wounds continue to heal well.  The inferior wound in the picture above has essentially closed.  The superior wound is much smaller in diameter.  There is healthy granulation tissue in the base.  The wound overlying the Achilles tendon is narrower and again has good granulation tissue.  Is very shallow.  There is no surrounding cellulitis.  There is no drainage or exudate.  The patient denies any pain.  The only concern is again the wound on the plantar aspect of the fifth MTP joint.  Today the portion of the wound that I debrided last time  shows good healthy white dermis closing in the ulcer.  There is granulation tissue in the base.  The proximal portion of the wound that I did not debride is starting to slough off.  There is no thick callus or dark black necrotic tissue.  Using a razor blade I tried to remove any residual necrotic tissue and was only able to remove a trace amount of superficial tissue before bleeding began. At hat time, my plan was: ? ?Wounds are healing well.  I anticipate 6 to 8 weeks before they will be totally healed through secondary intention.  Continue to recommend applying Silvadene cream to the wound base to keep the area moist and also  to prevent any secondary infection.  I recommended keeping them covered to maintain moisture in the wound.  There is no residual necrotic tissue to debride today.  Recheck in 2 weeks or as needed.  I will send in a prescription for Silvadene cream.  Also sent in a prescription for Keflex with instructions on when to fill if he develops any evidence of a secondary cellulitis that they can start medication immediately before they come in to see me.  However at the present time there is no sign of any secondary infection.  The patient's daughter is a Marine scientist and is doing an exceptional job of caring for the wounds. ? ?02/24/22 ?Daughters have done an exceptional job caring for the patient's foot.  The wounds on the dorsum of his foot have essentially closed through secondary intention.  The wound over his Achilles tendon has closed the secondary intention.  Unfortunately, the wound over his fifth metatarsal is still unchanged.  There is yellow slough in the center of the wound.  The wound edges have not closed.  Patient also has a black eschar on the tip of his great toe.  I believe this is due to arterial insufficiency.  However the patient will be high risk for any surgical intervention to try to improve circulation.  Patient may benefit from hyperbaric oxygen versus enzymatic debridement of the slough.  Patient has yet to see wound care ?Past Medical History:  ?Diagnosis Date  ? Acid reflux   ? Cancer ([place]) 09/05/13  ? left neck-non-hodgkins lymphoma  ? Hypertension   ? requires diuretic  ? Hypothyroidism   ? Malaria   ? while in the service  ? Peripheral vascular disease ([place])   ? slow wound healing on legs  ? ?Past Surgical History:  ?Procedure Laterality Date  ? AMPUTATION Left 06/25/2015  ? Procedure: LEFT  HALLUX AMPUTATION,;  Surgeon: Wylene Simmer, MD;  Location: [facility];  Service: Orthopedics;  Laterality: Left;  LOCAL/MAC  ? CHOLECYSTECTOMY N/A 11/08/2018  ? Procedure: LAPAROSCOPIC  CHOLECYSTECTOMY WITH INTRAOPERATIVE CHOLANGIOGRAM;  Surgeon: [name], MD;  [facility];  Service: General;  Laterality: N/A;  ? MASS BIOPSY Left 09/05/2013  ? Procedure: EXCISIONAL BIOPSY OF LEFT NECK MASS;  Surgeon: Jerrell Belfast, MD;  Location: [facility];  Service: ENT;  Laterality: Left;  ? PACEMAKER IMPLANT N/A 05/28/2020  ? Procedure: PACEMAKER IMPLANT;  Surgeon: Thompson Grayer, MD;  Location: [facility];  Service: Cardiovascular;  Laterality: N/A;  ? TONSILLECTOMY    ? YAG LASER APPLICATION Right 03/24/622  ? Procedure: YAG LASER APPLICATION;  Surgeon: [name], MD;  Location: [facility];  Service: Ophthalmology;  Laterality: Right;  ? ?Current Outpatient Medications on File Prior to Visit  ?Medication Sig Dispense Refill  ? amLODipine (NORVASC) 5 MG  tablet TAKE 1 TABLET BY MOUTH  DAILY 90 tablet 3  ? aspirin EC 81 MG tablet Take 81 mg by mouth daily. Swallow whole.    ? betamethasone dipropionate 0.05 % cream Apply topically 2 (two) times daily. Apply small amount to red areas on legs twice daily. 45 g 1  ? cephALEXin (KEFLEX) 500 MG capsule Take 1 capsule (500 mg total) by mouth 3 (three) times daily. 21 capsule 0  ? diphenhydramine-acetaminophen (TYLENOL PM) 25-500 MG TABS tablet Take 1 tablet by mouth at bedtime as needed.    ? enalapril (VASOTEC) 20 MG tablet TAKE 1 TABLET BY MOUTH  DAILY 90 tablet 3  ? escitalopram (LEXAPRO) 10 MG tablet TAKE 1 TABLET BY MOUTH EVERY DAY 90 tablet 2  ? furosemide (LASIX) 20 MG tablet TAKE 2 TABLETS BY MOUTH  DAILY 180 tablet 3  ? levothyroxine (SYNTHROID) 112 MCG tablet TAKE 1 TABLET BY MOUTH  DAILY 90 tablet 3  ? mometasone (ELOCON) 0.1 % cream USE AS DIRECTED 45 g 1  ? NONFORMULARY OR COMPOUNDED ITEM See pharmacy note (Patient taking differently: as needed. Compound Mixture for feet) 120 each 2  ? pregabalin (LYRICA) 25 MG capsule TAKE 1 CAPSULE BY MOUTH IN  THE MORNING AND 2 CAPSULES  IN THE EVENING 270 capsule 2  ? Rivaroxaban (XARELTO) 15 MG TABS tablet  Take 1 tablet (15 mg total) by mouth daily with supper. 90 tablet 3  ? silver sulfADIAZINE (SILVADENE) 1 % cream Apply 1 application topically daily. 50 g 2  ? triamcinolone (KENALOG) 0.1 % Apply 1 application topic

## 2022-02-24 NOTE — Telephone Encounter (Signed)
Left vm asking pt to rtn my call so we can schedule him an appt per providers request. ?

## 2022-02-24 NOTE — Telephone Encounter (Signed)
Please call pt to make an appt ?

## 2022-02-25 ENCOUNTER — Ambulatory Visit (INDEPENDENT_AMBULATORY_CARE_PROVIDER_SITE_OTHER): Payer: Medicare Other

## 2022-02-25 DIAGNOSIS — I442 Atrioventricular block, complete: Secondary | ICD-10-CM

## 2022-02-25 LAB — CUP PACEART REMOTE DEVICE CHECK
Battery Remaining Longevity: 98 mo
Battery Remaining Percentage: 81 %
Battery Voltage: 3.01 V
Brady Statistic AP VP Percent: 32 %
Brady Statistic AP VS Percent: 1 %
Brady Statistic AS VP Percent: 65 %
Brady Statistic AS VS Percent: 1 %
Brady Statistic RA Percent Paced: 6.6 %
Brady Statistic RV Percent Paced: 95 %
Date Time Interrogation Session: 20230331020014
Implantable Lead Implant Date: 20210701
Implantable Lead Implant Date: 20210701
Implantable Lead Location: 753859
Implantable Lead Location: 753860
Implantable Pulse Generator Implant Date: 20210701
Lead Channel Impedance Value: 410 Ohm
Lead Channel Impedance Value: 490 Ohm
Lead Channel Pacing Threshold Amplitude: 0.75 V
Lead Channel Pacing Threshold Amplitude: 1.25 V
Lead Channel Pacing Threshold Pulse Width: 0.5 ms
Lead Channel Pacing Threshold Pulse Width: 0.5 ms
Lead Channel Sensing Intrinsic Amplitude: 0.5 mV
Lead Channel Sensing Intrinsic Amplitude: 12 mV
Lead Channel Setting Pacing Amplitude: 1 V
Lead Channel Setting Pacing Amplitude: 2.5 V
Lead Channel Setting Pacing Pulse Width: 0.5 ms
Lead Channel Setting Sensing Sensitivity: 2 mV
Pulse Gen Model: 2272
Pulse Gen Serial Number: 3842377

## 2022-03-03 ENCOUNTER — Ambulatory Visit: Payer: Medicare Other | Admitting: Internal Medicine

## 2022-03-03 VITALS — BP 116/64 | HR 70 | Ht 73.0 in | Wt 197.0 lb

## 2022-03-03 DIAGNOSIS — I442 Atrioventricular block, complete: Secondary | ICD-10-CM | POA: Diagnosis not present

## 2022-03-03 DIAGNOSIS — Z950 Presence of cardiac pacemaker: Secondary | ICD-10-CM

## 2022-03-03 NOTE — Patient Instructions (Signed)
Medication Instructions:  ?Your physician recommends that you continue on your current medications as directed. Please refer to the Current Medication list given to you today. ?*If you need a refill on your cardiac medications before your next appointment, please call your pharmacy* ? ?Lab Work: ?None. ?If you have labs (blood work) drawn today and your tests are completely normal, you will receive your results only by: ?MyChart Message (if you have MyChart) OR ?A paper copy in the mail ?If you have any lab test that is abnormal or we need to change your treatment, we will call you to review the results. ? ?Testing/Procedures: ?None. ? ?Follow-Up: ?At [facility], you and your health needs are our priority.  As part of our continuing mission to provide you with exceptional heart care, we have created designated Provider Care Teams.  These Care Teams include your primary Cardiologist (physician) and Advanced Practice Providers (APPs -  Physician Assistants and Nurse Practitioners) who all work together to provide you with the care you need, when you need it. ? ?Your physician wants you to follow-up in: 12 months with  ?one of the following Advanced Practice Providers on your designated Care Team:   ? ? ?Legrand Como "[name]" [place], PA-C ?  You will receive a reminder letter in the mail two months in advance. If you don't receive a letter, please call our office to schedule the follow-up appointment. ? ?We recommend signing up for the patient portal called "MyChart".  Sign up information is provided on this After Visit Summary.  MyChart is used to connect with patients for Virtual Visits (Telemedicine).  Patients are able to view lab/test results, encounter notes, upcoming appointments, etc.  Non-urgent messages can be sent to your provider as well.   ?To learn more about what you can do with MyChart, go to [email or web address].   ? ?Any Other Special Instructions Will Be Listed Below (If Applicable). ? ? ? ? ?   ? ? ?

## 2022-03-03 NOTE — Progress Notes (Signed)
? ? ?PCP: Susy Frizzle, MD ?  ?Primary EP:  Dr Rayann Heman ? ?[name] is a 88 y.o. male who presents today for routine electrophysiology followup.  Since last being seen in our clinic, the patient reports doing reasonably well given his advanced age.  He is not very active.  He has a wound on his R foot which is not healing.  He is being managed by Dr Dennard Schaumann and has a wound appointment soon.  Today, he denies symptoms of palpitations, chest pain, shortness of breath,   dizziness, presyncope, or syncope. + stable edema.  The patient is otherwise without complaint today.  ? ?Past Medical History:  ?Diagnosis Date  ? Acid reflux   ? Cancer ([place]) 09/05/13  ? left neck-non-hodgkins lymphoma  ? Hypertension   ? requires diuretic  ? Hypothyroidism   ? Malaria   ? while in the service  ? Peripheral vascular disease ([place])   ? slow wound healing on legs  ? ?Past Surgical History:  ?Procedure Laterality Date  ? AMPUTATION Left 06/25/2015  ? Procedure: LEFT  HALLUX AMPUTATION,;  Surgeon: Wylene Simmer, MD;  Location: [facility];  Service: Orthopedics;  Laterality: Left;  LOCAL/MAC  ? CHOLECYSTECTOMY N/A 11/08/2018  ? Procedure: LAPAROSCOPIC CHOLECYSTECTOMY WITH INTRAOPERATIVE CHOLANGIOGRAM;  Surgeon: [name], MD;  Location: [facility];  Service: General;  Laterality: N/A;  ? MASS BIOPSY Left 09/05/2013  ? Procedure: EXCISIONAL BIOPSY OF LEFT NECK MASS;  Surgeon: Jerrell Belfast, MD;  Location: [facility];  Service: ENT;  Laterality: Left;  ? PACEMAKER IMPLANT N/A 05/28/2020  ? Procedure: PACEMAKER IMPLANT;  Surgeon: Thompson Grayer, MD;  Location: [facility];  Service: Cardiovascular;  Laterality: N/A;  ? TONSILLECTOMY    ? YAG LASER APPLICATION Right 0/93/2671  ? Procedure: YAG LASER APPLICATION;  Surgeon: [name], MD;  Location: [facility];  Service: Ophthalmology;  Laterality: Right;  ? ? ?ROS- all systems are reviewed and negative except as per HPI above ? ?Current Outpatient Medications  ?Medication Sig  Dispense Refill  ? aspirin EC 81 MG tablet Take 81 mg by mouth daily. Swallow whole.    ? betamethasone dipropionate 0.05 % cream Apply topically 2 (two) times daily. Apply small amount to red areas on legs twice daily. 45 g 1  ? diphenhydramine-acetaminophen (TYLENOL PM) 25-500 MG TABS tablet Take 1 tablet by mouth at bedtime as needed.    ? furosemide (LASIX) 20 MG tablet TAKE 2 TABLETS BY MOUTH  DAILY 180 tablet 3  ? levothyroxine (SYNTHROID) 112 MCG tablet TAKE 1 TABLET BY MOUTH  DAILY 90 tablet 3  ? mometasone (ELOCON) 0.1 % cream USE AS DIRECTED 45 g 1  ? NONFORMULARY OR COMPOUNDED ITEM See pharmacy note (Patient taking differently: as needed. Compound Mixture for feet) 120 each 2  ? pregabalin (LYRICA) 25 MG capsule TAKE 1 CAPSULE BY MOUTH IN  THE MORNING AND 2 CAPSULES  IN THE EVENING 270 capsule 2  ? silver sulfADIAZINE (SILVADENE) 1 % cream Apply 1 application topically daily. 50 g 2  ? triamcinolone (KENALOG) 0.1 % Apply 1 application topically 3 (three) times daily. 30 g 1  ? vitamin B-12 (CYANOCOBALAMIN) 1000 MCG tablet Take 1 tablet (1,000 mcg total) by mouth daily. 30 tablet 5  ? amLODipine (NORVASC) 5 MG tablet TAKE 1 TABLET BY MOUTH  DAILY (Patient not taking: Reported on 03/03/2022) 90 tablet 3  ? cephALEXin (KEFLEX) 500 MG capsule Take 1 capsule (500 mg total) by  mouth 3 (three) times daily. (Patient not taking: Reported on 03/03/2022) 21 capsule 0  ? enalapril (VASOTEC) 20 MG tablet TAKE 1 TABLET BY MOUTH  DAILY (Patient not taking: Reported on 03/03/2022) 90 tablet 3  ? escitalopram (LEXAPRO) 10 MG tablet TAKE 1 TABLET BY MOUTH EVERY DAY (Patient not taking: Reported on 03/03/2022) 90 tablet 2  ? Rivaroxaban (XARELTO) 15 MG TABS tablet Take 1 tablet (15 mg total) by mouth daily with supper. (Patient not taking: Reported on 03/03/2022) 90 tablet 3  ? ?No current facility-administered medications for this visit.  ? ? ?Physical Exam: ?Vitals:  ? 03/03/22 1424  ?BP: 116/64  ?Pulse: 70  ?SpO2: 99%  ?Weight:  197 lb (89.4 kg)  ?Height: _0  (1.854 m)  ? ? ?GEN- The patient is elderly and frail appearing, alert and oriented x 3 today.   ?Head- normocephalic, atraumatic ?Eyes-  poor vision ?Ears- hearing poor ?Oropharynx- clear ?Lungs- Clear to ausculation bilaterally, normal work of breathing ?Chest- pacemaker pocket is well healed ?Heart- Regular rate and rhythm (paced) ?GI- soft  ?Extremities- + venous stasis changes (R>L).  + wound on R foot is wrapped ? ?Pacemaker interrogation- reviewed in detail today,  See PACEART report ? ?ekg tracing ordered today is personally reviewed and shows afib with V pacing ? ?Assessment and Plan: ? ?1. Symptomatic transient complete heart block ?Normal pacemaker function ?See Claudia Desanctis Art report ?Will make VVIR 60 bpm today ?he is not device dependant today ? ?2. HTN ?Stable ?No change required today ? ?3. Persistent atrial fibrillation ?Not felt to be a candidate for [facility] ?Given advanced age, I would not advise additional EP procedures, including Watchman ?Programmed VVIR today ? ?Return to see EP APP in a year ? ?Thompson Grayer MD, FACC ?03/03/2022 ?2:54 PM ? ? ?

## 2022-03-09 NOTE — Progress Notes (Signed)
Remote pacemaker transmission.   

## 2022-03-15 ENCOUNTER — Ambulatory Visit
Admission: RE | Admit: 2022-03-15 | Discharge: 2022-03-15 | Disposition: A | Discharge status: Home / Self Care | Payer: Medicare Other | Attending: Physician Assistant | Admitting: Physician Assistant

## 2022-03-15 ENCOUNTER — Encounter: Payer: Medicare Other | Attending: Physician Assistant | Admitting: Physician Assistant

## 2022-03-15 ENCOUNTER — Other Ambulatory Visit: Payer: Self-pay | Admitting: Physician Assistant

## 2022-03-15 ENCOUNTER — Ambulatory Visit
Admission: RE | Admit: 2022-03-15 | Discharge: 2022-03-15 | Disposition: A | Discharge status: Home / Self Care | Payer: Medicare Other | Source: Ambulatory Visit | Attending: Physician Assistant | Admitting: Physician Assistant

## 2022-03-15 DIAGNOSIS — L97312 Non-pressure chronic ulcer of right ankle with fat layer exposed: Secondary | ICD-10-CM | POA: Insufficient documentation

## 2022-03-15 DIAGNOSIS — Z8572 Personal history of non-Hodgkin lymphomas: Secondary | ICD-10-CM | POA: Insufficient documentation

## 2022-03-15 DIAGNOSIS — I872 Venous insufficiency (chronic) (peripheral): Secondary | ICD-10-CM | POA: Diagnosis not present

## 2022-03-15 DIAGNOSIS — I739 Peripheral vascular disease, unspecified: Secondary | ICD-10-CM | POA: Diagnosis not present

## 2022-03-15 DIAGNOSIS — S81801A Unspecified open wound, right lower leg, initial encounter: Secondary | ICD-10-CM

## 2022-03-15 DIAGNOSIS — M7989 Other specified soft tissue disorders: Secondary | ICD-10-CM | POA: Diagnosis not present

## 2022-03-15 DIAGNOSIS — Z89412 Acquired absence of left great toe: Secondary | ICD-10-CM | POA: Insufficient documentation

## 2022-03-15 DIAGNOSIS — I10 Essential (primary) hypertension: Secondary | ICD-10-CM | POA: Insufficient documentation

## 2022-03-15 DIAGNOSIS — L97512 Non-pressure chronic ulcer of other part of right foot with fat layer exposed: Secondary | ICD-10-CM | POA: Insufficient documentation

## 2022-03-15 DIAGNOSIS — S91301A Unspecified open wound, right foot, initial encounter: Secondary | ICD-10-CM | POA: Diagnosis not present

## 2022-03-15 NOTE — Progress Notes (Signed)
JEANNE, TERRANCE T. ([id number]) ?Visit Report for 03/15/2022 ?Abuse Risk Screen Details ?Patient Name: ABRAM, SAX ?Date of Service: 03/15/2022 12:45 PM ?Medical Record Number: [id number] ?Patient Account Number: [id number] ?Date of Birth/Sex: [birth year] (88 y.o. M) ?Treating RN: Levora Dredge ?Primary Care Charisse Wendell: Jenna Luo Other Clinician: ?Referring Henya Aguallo: Jenna Luo ?Treating Jerzy Roepke/Extender: Jeri Cos ?Weeks in Treatment: 0 ?Abuse Risk Screen Items ?Answer ?ABUSE RISK SCREEN: ?Has anyone close to you tried to hurt or harm you recentlyo No ?Do you feel uncomfortable with anyone in your familyo No ?Has anyone forced you do things that you didnot want to doo No ?Electronic Signature(s) ?Signed: 03/15/2022 4:40:46 PM By: Levora Dredge ?Entered By: Levora Dredge on 03/15/2022 13:00:32 ?Kamaili, Ameir T. ([id number]) ?-------------------------------------------------------------------------------- ?Activities of Daily Living Details ?Patient Name: HILLERY, BHALLA ?Date of Service: 03/15/2022 12:45 PM ?Medical Record Number: [id number] ?Patient Account Number: [id number] ?Date of Birth/Sex: 04/09/1919 (88 y.o. M) ?Treating RN: Levora Dredge ?Primary Care Darrell Hauk: Jenna Luo Other Clinician: ?Referring Florinda Taflinger: Jenna Luo ?Treating Kymberly Blomberg/Extender: Jeri Cos ?Weeks in Treatment: 0 ?Activities of Daily Living Items ?Answer ?Activities of Daily Living (Please select one for each item) ?Drive Automobile Not Able ?Take Medications Need Assistance ?Use Telephone Need Assistance ?Care for Appearance Need Assistance ?Use Toilet Completely Able ?Bath / Shower Completely Able ?Dress Self Completely Able ?Feed Self Completely Able ?Walk Need Assistance ?Get In / Out Bed Completely Able ?Housework Need Assistance ?Prepare Meals Need Assistance ?Handle Money Need Assistance ?Shop for Self Need Assistance ?Electronic Signature(s) ?Signed: 03/15/2022 4:40:46 PM By: Levora Dredge ?Entered By: Levora Dredge on 03/15/2022 13:01:17 ?Soso, Kendal T. ([id number]) ?-------------------------------------------------------------------------------- ?Education Screening Details ?Patient Name: PHUOC, HUY ?Date of Service: 03/15/2022 12:45 PM ?Medical Record Number: [id number] ?Patient Account Number: [id number] ?Date of Birth/Sex: Dec 16, 1918 (88 y.o. M) ?Treating RN: Levora Dredge ?Primary Care Kamareon Sciandra: Jenna Luo Other Clinician: ?Referring Amar Keenum: Jenna Luo ?Treating Moorea Boissonneault/Extender: Jeri Cos ?Weeks in Treatment: 0 ?Learning Preferences/Education Level/Primary Language ?Learning Preference: Explanation, Demonstration, Video, Communication Board, Printed Material ?Preferred Language: English ?Cognitive Barrier ?Language Barrier: No ?Translator Needed: No ?Memory Deficit: No ?Emotional Barrier: No ?Cultural/Religious Beliefs Affecting Medical Care: No ?Physical Barrier ?Impaired Vision: No ?Impaired Hearing: Yes HOH ?Knowledge/Comprehension ?Knowledge Level: High ?Comprehension Level: High ?Ability to understand written instructions: High ?Ability to understand verbal instructions: High ?Motivation ?Anxiety Level: Calm ?Cooperation: Cooperative ?Education Importance: Acknowledges Need ?Interest in Health Problems: Asks Questions ?Perception: Coherent ?Willingness to Engage in Self-Management ?High ?Activities: ?Readiness to Engage in Self-Management ?High ?Activities: ?Electronic Signature(s) ?Signed: 03/15/2022 4:40:46 PM By: Levora Dredge ?Entered By: Levora Dredge on 03/15/2022 13:01:41 ?Osakis, Abdulkareem T. ([id number]) ?-------------------------------------------------------------------------------- ?Fall Risk Assessment Details ?Patient Name: AVIAN, KONIGSBERG ?Date of Service: 03/15/2022 12:45 PM ?Medical Record Number: [id number] ?Patient Account Number: [id number] ?Date of Birth/Sex: 1919-11-24 (88 y.o. M) ?Treating RN: Levora Dredge ?Primary Care Hassel Uphoff: Jenna Luo Other  Clinician: ?Referring Matison Nuccio: Jenna Luo ?Treating Alaynna Kerwood/Extender: Jeri Cos ?Weeks in Treatment: 0 ?Fall Risk Assessment Items ?Have you had 2 or more falls in the last 12 monthso 0 No ?Have you had any fall that resulted in injury in the last 12 monthso 0 No ?FALLS RISK SCREEN ?History of falling - immediate or within 3 months 0 No ?Secondary diagnosis (Do you have 2 or more medical diagnoseso) 0 No ?Ambulatory aid ?None/bed rest/wheelchair/nurse 0 Yes ?Crutches/cane/walker 0 No ?Furniture 0 No ?Intravenous therapy Access/Saline/Heparin Lock 0 No ?Gait/Transferring ?Normal/ bed rest/ wheelchair 0 Yes ?Weak (short steps with or without shuffle, stooped but able to lift head while  walking, may ?0 No ?seek support from furniture) ?Impaired (short steps with shuffle, may have difficulty arising from chair, head down, impaired ?0 No ?balance) ?Mental Status ?Oriented to own ability 0 Yes ?Electronic Signature(s) ?Signed: 03/15/2022 4:40:46 PM By: Levora Dredge ?Entered By: Levora Dredge on 03/15/2022 13:01:57 ?Selma, Caylon T. ([id number]) ?-------------------------------------------------------------------------------- ?Foot Assessment Details ?Patient Name: KAIKOA, MAGRO ?Date of Service: 03/15/2022 12:45 PM ?Medical Record Number: [id number] ?Patient Account Number: [id number] ?Date of Birth/Sex: 19-Nov-1919 (89 y.o. M) ?Treating RN: Levora Dredge ?Primary Care Eilee Schader: Jenna Luo Other Clinician: ?Referring Janiah Devinney: Jenna Luo ?Treating Ronna Herskowitz/Extender: Jeri Cos ?Weeks in Treatment: 0 ?Foot Assessment Items ?Site Locations ?+ = Sensation present, - = Sensation absent, C = Callus, U = Ulcer ?R = Redness, W = Warmth, M = Maceration, PU = Pre-ulcerative lesion ?F = Fissure, S = Swelling, D = Dryness ?Assessment ?Right: Left: ?Other Deformity: No No ?Prior Foot Ulcer: No No ?Prior Amputation: No No ?Charcot Joint: No No ?Ambulatory Status: Ambulatory With Help ?Assistance Device:  Wheelchair ?Gait: Unsteady ?Electronic Signature(s) ?Signed: 03/15/2022 4:40:46 PM By: Levora Dredge ?Entered By: Levora Dredge on 03/15/2022 13:10:24 ?[place], Milam T. ([id number]) ?-------------------------------------------------------------------------------- ?Nutrition Risk Screening Details ?Patient Name: MARLAN, STEWARD ?Date of Service: 03/15/2022 12:45 PM ?Medical Record Number: [id number] ?Patient Account Number: [id number] ?Date of Birth/Sex: [birth year] (88 y.o. M) ?Treating RN: Levora Dredge ?Primary Care Johnathen Testa: Jenna Luo Other Clinician: ?Referring Shonta Phillis: Jenna Luo ?Treating Jozy Mcphearson/Extender: Jeri Cos ?Weeks in Treatment: 0 ?Height (in): 73 ?Weight (lbs): 200 ?Body Mass Index (BMI): 26.4 ?Nutrition Risk Screening Items ?Score Screening ?NUTRITION RISK SCREEN: ?I have an illness or condition that made me change the kind and/or amount of food I eat 0 No ?I eat fewer than two meals per day 0 No ?I eat few fruits and vegetables, or milk products 0 No ?I have three or more drinks of beer, liquor or wine almost every day 0 No ?I have tooth or mouth problems that make it hard for me to eat 0 No ?I don't always have enough money to buy the food I need 0 No ?I eat alone most of the time 0 No ?I take three or more different prescribed or over-the-counter drugs a day 0 No ?Without wanting to, I have lost or gained 10 pounds in the last six months 0 No ?I am not always physically able to shop, cook and/or feed myself 0 No ?Nutrition Protocols ?Good Risk Protocol 0 No interventions needed ?Moderate Risk Protocol ?High Risk Proctocol ?Risk Level: Good Risk ?Score: 0 ?Electronic Signature(s) ?Signed: 03/15/2022 4:40:46 PM By: Levora Dredge ?Entered By: Levora Dredge on 03/15/2022 13:02:19 ?

## 2022-03-15 NOTE — Progress Notes (Signed)
RAMSES, KLECKA T. ([id number]) ?Visit Report for 03/15/2022 ?Allergy List Details ?Patient Name: [name], [name] ?Date of Service: 03/15/2022 12:45 PM ?Medical Record Number: [id number] ?Patient Account Number: [id number] ?Date of Birth/Sex: Apr 09, 1919 (88 y.o. M) ?Treating RN: Levora Dredge ?Primary Care Rosario Kushner: Jenna Luo Other Clinician: ?Referring Preslynn Bier: Jenna Luo ?Treating Valjean Ruppel/Extender: Jeri Cos ?Weeks in Treatment: 0 ?Allergies ?Active Allergies ?codeine ?Reaction: nausea and vomiting ?Percocet ?Reaction: nausea and vomiting ?Allergy Notes ?Electronic Signature(s) ?Signed: 03/15/2022 4:40:46 PM By: Levora Dredge ?Entered By: Levora Dredge on 03/15/2022 12:56:12 ?Delton Coombes ([id number]) ?-------------------------------------------------------------------------------- ?Arrival Information Details ?Patient Name: [name], [name] ?Date of Service: 03/15/2022 12:45 PM ?Medical Record Number: [id number] ?Patient Account Number: [id number] ?Date of Birth/Sex: 12/26/1918 (88 y.o. M) ?Treating RN: Levora Dredge ?Primary Care Maron Stanzione: Jenna Luo Other Clinician: ?Referring Cortnie Ringel: Jenna Luo ?Treating Estie Sproule/Extender: Jeri Cos ?Weeks in Treatment: 0 ?Visit Information ?Patient Arrived: Wheel Chair ?Arrival Time: 12:52 ?Accompanied By: daughter ?Transfer Assistance: EasyPivot Patient Lift ?Patient Identification Verified: Yes ?Secondary Verification Process Completed: Yes ?Patient Has Alerts: Yes ?Patient Alerts: Patient on Blood Thinner ?12/31/21 ABI L 0.79 R 0.76 ?History Since Last Visit ?Added or deleted any medications: No ?Any new allergies or adverse reactions: No ?Had a fall or experienced change in activities of daily living that may affect risk of falls: No ?Hospitalized since last visit: No ?Electronic Signature(s) ?Signed: 03/15/2022 4:40:46 PM By: Levora Dredge ?Entered By: Levora Dredge on 03/15/2022 13:21:57 ?[name], Jimmy T.  ([id number]) ?-------------------------------------------------------------------------------- ?Clinic Level of Care Assessment Details ?Patient Name: [name], [name] ?Date of Service: 03/15/2022 12:45 PM ?Medical Record Number: [id number] ?Patient Account Number: [id number] ?Date of Birth/Sex: 07/11/1919 (88 y.o. M) ?Treating RN: Levora Dredge ?Primary Care Abdias Hickam: Jenna Luo Other Clinician: ?Referring Becca Bayne: Jenna Luo ?Treating Cayde Held/Extender: Jeri Cos ?Weeks in Treatment: 0 ?Clinic Level of Care Assessment Items ?TOOL 1 Quantity Score ?'[]'$  - Use when EandM and Procedure is performed on INITIAL visit 0 ?ASSESSMENTS - Nursing Assessment / Reassessment ?X - General Physical Exam (combine w/ comprehensive assessment (listed just below) when performed on new ?1 20 ?pt. evals) ?X- 1 25 ?Comprehensive Assessment (HX, ROS, Risk Assessments, Wounds Hx, etc.) ?ASSESSMENTS - Wound and Skin Assessment / Reassessment ?'[]'$  - Dermatologic / Skin Assessment (not related to wound area) 0 ?ASSESSMENTS - Ostomy and/or Continence Assessment and Care ?'[]'$  - Incontinence Assessment and Management 0 ?'[]'$  - 0 ?Ostomy Care Assessment and Management (repouching, etc.) ?PROCESS - Coordination of Care ?X - Simple Patient / Family Education for ongoing care 1 15 ?'[]'$  - 0 ?Complex (extensive) Patient / Family Education for ongoing care ?X- 1 10 ?Staff obtains Consents, Records, Test Results / Process Orders ?'[]'$  - 0 ?Staff telephones HHA, Nursing Homes / Clarify orders / etc ?'[]'$  - 0 ?Routine Transfer to another Facility (non-emergent condition) ?'[]'$  - 0 ?Routine Hospital Admission (non-emergent condition) ?X- 1 15 ?New Admissions / Biomedical engineer / Ordering NPWT, Apligraf, etc. ?'[]'$  - 0 ?Emergency Hospital Admission (emergent condition) ?PROCESS - Special Needs ?'[]'$  - Pediatric / Minor Patient Management 0 ?'[]'$  - 0 ?Isolation Patient Management ?'[]'$  - 0 ?Hearing / Language / Visual special needs ?'[]'$  - 0 ?Assessment of  Community assistance (transportation, D/C planning, etc.) ?'[]'$  - 0 ?Additional assistance / Altered mentation ?'[]'$  - 0 ?Support Surface(s) Assessment (bed, cushion, seat, etc.) ?INTERVENTIONS - Miscellaneous ?'[]'$  - External ear exam 0 ?'[]'$  - 0 ?Patient Transfer (multiple staff / Civil Service fast streamer / Similar devices) ?'[]'$  - 0 ?Simple Staple / Suture removal (25 or less) ?'[]'$  - 0 ?  Complex Staple / Suture removal (26 or more) ?'[]'$  - 0 ?Hypo/Hyperglycemic Management (do not check if billed separately) ?'[]'$  - 0 ?Ankle / Brachial Index (ABI) - do not check if billed separately ?Has the patient been seen at the hospital within the last three years: Yes ?Total Score: 85 ?Level Of Care: New/Established - Level ?3 ?[name], Jimmy T. ([id number]) ?Electronic Signature(s) ?Signed: 03/15/2022 4:40:46 PM By: Levora Dredge ?Entered By: Levora Dredge on 03/15/2022 16:34:03 ?[name], Jimmy T. ([id number]) ?-------------------------------------------------------------------------------- ?Encounter Discharge Information Details ?Patient Name: [name], [name] ?Date of Service: 03/15/2022 12:45 PM ?Medical Record Number: [id number] ?Patient Account Number: [id number] ?Date of Birth/Sex: 07/24/1919 (88 y.o. M) ?Treating RN: Levora Dredge ?Primary Care Madelyne Millikan: Jenna Luo Other Clinician: ?Referring Jeanine Caven: Jenna Luo ?Treating Lidya Mccalister/Extender: Jeri Cos ?Weeks in Treatment: 0 ?Encounter Discharge Information Items Post Procedure Vitals ?Discharge Condition: Stable ?Temperature (?F): 97.6 ?Ambulatory Status: Wheelchair ?Pulse (bpm): 60 ?Discharge Destination: Home ?Respiratory Rate (breaths/min): 18 ?Transportation: Private Auto ?Blood Pressure (mmHg): 143/74 ?Accompanied By: family ?Schedule Follow-up Appointment: Yes ?Clinical Summary of Care: Patient Declined ?Electronic Signature(s) ?Signed: 03/15/2022 4:35:41 PM By: Levora Dredge ?Entered By: Levora Dredge on 03/15/2022 16:35:41 ?[name], Jimmy T.  ([id number]) ?-------------------------------------------------------------------------------- ?Lower Extremity Assessment Details ?Patient Name: [name], [name] ?Date of Service: 03/15/2022 12:45 PM ?Medical Record Number: [id number] ?Patient Account Number: [id number] ?Date of Birth/Sex: 02/24/1919 (88 y.o. M) ?Treating RN: Levora Dredge ?Primary Care Romina Divirgilio: Jenna Luo Other Clinician: ?Referring Kinisha Soper: Jenna Luo ?Treating Shivangi Lutz/Extender: Jeri Cos ?Weeks in Treatment: 0 ?Edema Assessment ?Assessed: [Left: No] [Right: No] ?Edema: [Left: Ye] [Right: s] ?Calf ?Left: Right: ?Point of Measurement: 35 cm From Medial Instep 38.5 cm ?Ankle ?Left: Right: ?Point of Measurement: 12 cm From Medial Instep 25 cm ?Electronic Signature(s) ?Signed: 03/15/2022 4:40:46 PM By: Levora Dredge ?Entered By: Levora Dredge on 03/15/2022 13:21:07 ?[name], Jimmy T. ([id number]) ?-------------------------------------------------------------------------------- ?Multi Wound Chart Details ?Patient Name: [name], [name] ?Date of Service: 03/15/2022 12:45 PM ?Medical Record Number: [id number] ?Patient Account Number: [id number] ?Date of Birth/Sex: 1919-01-31 (88 y.o. M) ?Treating RN: Levora Dredge ?Primary Care Takita Riecke: Jenna Luo Other Clinician: ?Referring Zafira Munos: Jenna Luo ?Treating Viera Okonski/Extender: Jeri Cos ?Weeks in Treatment: 0 ?Vital Signs ?Height(in): 73 ?Pulse(bpm): 60 ?Weight(lbs): 200 ?Blood Pressure(mmHg): 143/74 ?Body Mass Index(BMI): 26.4 ?Temperature(??F): 97.6 ?Respiratory Rate(breaths/min): 18 ?Photos: [N/A:N/A] ?Wound Location: Right, Lateral Foot Right, Medial Ankle N/A ?Wounding Event: Gradually Appeared Gradually Appeared N/A ?Primary Etiology: Venous Leg Ulcer Venous Leg Ulcer N/A ?Comorbid History: Cataracts, Hypertension, Peripheral Cataracts, Hypertension, Peripheral N/A ?Venous Disease, Received Venous Disease, Received ?Chemotherapy, Received Radiation Chemotherapy, Received  Radiation ?Date Acquired: 11/28/2021 03/01/2022 N/A ?Weeks of Treatment: 0 0 N/A ?Wound Status: Open Open N/A ?Wound Recurrence: No No N/A ?Measurements L x W x D (cm) 5.5x1.6x0.3 1x1x0.2 N/A ?Area (cm?) : 6.912 0.785 N/A ?Volume (cm?) : 2.073 0.157 N/A ?Classification: Full Thickness Without Expo

## 2022-03-16 DIAGNOSIS — L97512 Non-pressure chronic ulcer of other part of right foot with fat layer exposed: Secondary | ICD-10-CM | POA: Diagnosis not present

## 2022-03-16 DIAGNOSIS — I10 Essential (primary) hypertension: Secondary | ICD-10-CM | POA: Diagnosis not present

## 2022-03-16 NOTE — Progress Notes (Addendum)
ZAKARIAH, URWIN T. ([id number]) ?Visit Report for 03/15/2022 ?Chief Complaint Document Details ?Patient Name: [name] ?Date of Service: 03/15/2022 12:45 PM ?Medical Record Number: [id number] ?Patient Account Number: [id number] ?Date of Birth/Sex: Nov 28, 1919 (88 y.o. M) ?Treating RN: Levora Dredge ?Primary Care Provider: Jenna Luo Other Clinician: ?Referring Provider: Jenna Luo ?Treating Provider/Extender: Jeri Cos ?Weeks in Treatment: 0 ?Information Obtained from: Patient ?Chief Complaint ?Right foot and ankle ulcers ?Electronic Signature(s) ?Signed: 03/15/2022 1:31:36 PM By: Worthy Keeler PA-C ?Previous Signature: 03/15/2022 1:19:15 PM Version By: Worthy Keeler PA-C ?Entered By: Worthy Keeler on 03/15/2022 13:31:36 ?[place], Timarion T. ([id number]) ?-------------------------------------------------------------------------------- ?Debridement Details ?Patient Name: [name] ?Date of Service: 03/15/2022 12:45 PM ?Medical Record Number: [id number] ?Patient Account Number: [id number] ?Date of Birth/Sex: Jan 06, 1919 (88 y.o. M) ?Treating RN: Levora Dredge ?Primary Care Provider: Jenna Luo Other Clinician: ?Referring Provider: Jenna Luo ?Treating Provider/Extender: Jeri Cos ?Weeks in Treatment: 0 ?Debridement Performed for ?Wound #3 Right,Lateral Foot ?Assessment: ?Performed By: Physician Tommie Sams., PA-C ?Debridement Type: Debridement ?Severity of Tissue Pre Debridement: Fat layer exposed ?Level of Consciousness (Pre- ?Awake and Alert ?procedure): ?Pre-procedure Verification/Time Out ?Yes - 13:38 ?Taken: ?Total Area Debrided (L x W): 5.5 (cm) x 1.6 (cm) = 8.8 (cm?) ?Tissue and other material ?Non-Viable, Subcutaneous ?debrided: ?Level: Skin/Subcutaneous Tissue ?Debridement Description: Excisional ?Instrument: Forceps, Scissors ?Bleeding: Minimum ?Hemostasis Achieved: Pressure ?Response to Treatment: Procedure was tolerated well ?Level of Consciousness (Post- ?Awake and  Alert ?procedure): ?Post Debridement Measurements of Total Wound ?Length: (cm) 5.5 ?Width: (cm) 1.6 ?Depth: (cm) 0.3 ?Volume: (cm?) 2.073 ?Character of Wound/Ulcer Post Debridement: Stable ?Severity of Tissue Post Debridement: Fat layer exposed ?Post Procedure Diagnosis ?Same as Pre-procedure ?Electronic Signature(s) ?Signed: 03/15/2022 4:40:46 PM By: Levora Dredge ?Signed: 03/15/2022 5:00:17 PM By: Worthy Keeler PA-C ?Entered By: Levora Dredge on 03/15/2022 13:39:36 ?Delton Coombes ([id number]) ?-------------------------------------------------------------------------------- ?HPI Details ?Patient Name: [name] ?Date of Service: 03/15/2022 12:45 PM ?Medical Record Number: [id number] ?Patient Account Number: [id number] ?Date of Birth/Sex: 05/11/1919 (88 y.o. M) ?Treating RN: Levora Dredge ?Primary Care Provider: Jenna Luo Other Clinician: ?Referring Provider: Jenna Luo ?Treating Provider/Extender: Jeri Cos ?Weeks in Treatment: 0 ?History of Present Illness ?Location: left and right dorsum foot and in between his toes ?Quality: Patient reports No Pain. ?Severity: Patient states wound are getting worse. ?Duration: Patient has had the wound for < 2 weeks prior to presenting for treatment ?Timing: Pain in wound is Intermittent (comes and goes ?Context: The wound appeared gradually over time ?Modifying Factors: Other treatment(s) tried include:admitted to the hospital and had a arterial workup and has been on oral antibiotics ?Associated Signs and Symptoms: Patient reports having increase discharge. ?HPI Description: 88 year old gentleman comes from [facility] with a history of having recently been admitted to the hospital ?between February 20 and 01/20/2017, and he was admitted with cellulitis and peripheral vascular disease with possible status dermatitis and an ?open toe wound. he is got a medical history significant of peripheral vascular disease, hypertension, non-Hodgkin's lymphoma  in remission and ?amputation of the left great toe. status post amputation of the left big toe in July 2016 and biopsy of the left neck mass in October 2014. he does ?not smoke at present and given In the 80s. I understand in the past he's had a venous duplex study done and was treated for symptomatic ?varicose veins with compression stockings. ?Patient was treated in hospital with Rocephin and Flagyl and was discharged home on Keflex and Diflucan for 7 days. ?He had  a ABI which showed reduced light flow in the left lower extremity and vascular surgery recommended an outpatient angiogram. right ABI ?was 0.94 with a monophasic flow in the left ABI was 0.67 with a monophasic flow. was seen by the vascular surgeon Dr. Gwenlyn Saran who noted wounds ?of both left and right toes with depressed ABIs bilaterally and objective evidence of chronic venous disease. ?x-ray of the left foot -- IMPRESSION:1. Diffuse soft tissue swelling and osteopenia. No obvious bony destructive process or periosteal reaction. ?2. Patient is status post amputation of the tuft of the first distal phalanx. ?Readmission: ?03-15-2022 upon evaluation today patient appears to be doing poorly in regard to a wound over the lateral portion of his right foot as well as the ?medial portion of his right ankle. Subsequently the ankle actually is doing decently well. The foot is not quite as good. There is a lot of eschar ?noted and necrotic tissue in general. The foot has been present since around the beginning of January the ankle for 2 weeks. The patient likely is ?going require an x-ray and it was done that at this time of the foot I think that would be ideal to ensure there is no obvious signs of osteomyelitis. ?The patient has currently been using Silvadene cream. He is currently 88 years old. He will be 88 on September 17, 2021. With that being said ?based on what we are seeing currently he does have some peripheral vascular disease I did review his arterial  studies performed at vascular and ?it did show that he had a right ABI of 0.76 the left ABI of 0.79 and a right TBI of 0.39 and a left TBI of 0.44. ?The patient does have a history of chronic venous insufficiency, peripheral vascular disease, hypertension, but he is not diabetic. ?Electronic Signature(s) ?Signed: 03/15/2022 4:48:52 PM By: Worthy Keeler PA-C ?Entered By: Worthy Keeler on 03/15/2022 16:48:52 ?[place], Peretz T. ([id number]) ?-------------------------------------------------------------------------------- ?Physical Exam Details ?Patient Name: MANVIR, PRABHU ?Date of Service: 03/15/2022 12:45 PM ?Medical Record Number: [id number] ?Patient Account Number: [id number] ?Date of Birth/Sex: Jun 21, 1919 (88 y.o. M) ?Treating RN: Levora Dredge ?Primary Care Provider: Jenna Luo Other Clinician: ?Referring Provider: Jenna Luo ?Treating Provider/Extender: Jeri Cos ?Weeks in Treatment: 0 ?Constitutional ?patient is hypertensive.. pulse regular and within target range for patient.Marland Kitchen respirations regular, non-labored and within target range for patient.. ?temperature within target range for patient.. Well-nourished and well-hydrated in no acute distress. ?Eyes ?conjunctiva clear no eyelid edema noted. pupils equal round and reactive to light and accommodation. ?Ears, Nose, Mouth, and Throat ?no gross abnormality of ear auricles or external auditory canals. normal hearing noted during conversation. mucus membranes moist. ?Respiratory ?normal breathing without difficulty. ?Cardiovascular ?Absent posterior tibial and dorsalis pedis pulses bilateral lower extremities. no clubbing, cyanosis, significant edema, <3 sec cap refill. ?Musculoskeletal ?shuffling gait. no significant deformity or arthritic changes, no loss or range of motion, no clubbing. ?Psychiatric ?this patient is able to make decisions and demonstrates good insight into disease process. Alert and Oriented x 3. pleasant and  cooperative. ?Notes ?Upon inspection patient's wound bed actually showed signs of doing well in regard to the medial right ankle actually was able just to clean this ?with saline and gauze no sharp debridement was necessary today. With re

## 2022-03-22 ENCOUNTER — Encounter: Payer: Medicare Other | Admitting: Physician Assistant

## 2022-03-22 DIAGNOSIS — L97312 Non-pressure chronic ulcer of right ankle with fat layer exposed: Secondary | ICD-10-CM | POA: Diagnosis not present

## 2022-03-22 DIAGNOSIS — Z8572 Personal history of non-Hodgkin lymphomas: Secondary | ICD-10-CM | POA: Diagnosis not present

## 2022-03-22 DIAGNOSIS — I872 Venous insufficiency (chronic) (peripheral): Secondary | ICD-10-CM | POA: Diagnosis not present

## 2022-03-22 DIAGNOSIS — L97518 Non-pressure chronic ulcer of other part of right foot with other specified severity: Secondary | ICD-10-CM | POA: Diagnosis not present

## 2022-03-22 DIAGNOSIS — I10 Essential (primary) hypertension: Secondary | ICD-10-CM | POA: Diagnosis not present

## 2022-03-22 DIAGNOSIS — L97512 Non-pressure chronic ulcer of other part of right foot with fat layer exposed: Secondary | ICD-10-CM | POA: Diagnosis not present

## 2022-03-22 DIAGNOSIS — I739 Peripheral vascular disease, unspecified: Secondary | ICD-10-CM | POA: Diagnosis not present

## 2022-03-22 DIAGNOSIS — Z89412 Acquired absence of left great toe: Secondary | ICD-10-CM | POA: Diagnosis not present

## 2022-03-22 NOTE — Progress Notes (Addendum)
QAADIR, KENT T. ([id number]) ?Visit Report for 03/22/2022 ?Chief Complaint Document Details ?Patient Name: [name], [name] ?Date of Service: 03/22/2022 1:00 PM ?Medical Record Number: [id number] ?Patient Account Number: [id number] ?Date of Birth/Sex: [birth year] (88 y.o. M) ?Treating RN: Donnamarie Poag ?Primary Care Provider: Jenna Luo Other Clinician: ?Referring Provider: Jenna Luo ?Treating Provider/Extender: Jeri Cos ?Weeks in Treatment: 1 ?Information Obtained from: Patient ?Chief Complaint ?Right foot and ankle ulcers ?Electronic Signature(s) ?Signed: 03/22/2022 1:25:55 PM By: Worthy Keeler PA-C ?Entered By: Worthy Keeler on 03/22/2022 13:25:55 ?[name], Jimmy T. ([id number]) ?-------------------------------------------------------------------------------- ?Debridement Details ?Patient Name: RIC, ROSENBERG ?Date of Service: 03/22/2022 1:00 PM ?Medical Record Number: [id number] ?Patient Account Number: [id number] ?Date of Birth/Sex: 1919/02/12 (88 y.o. M) ?Treating RN: Donnamarie Poag ?Primary Care Provider: Jenna Luo Other Clinician: ?Referring Provider: Jenna Luo ?Treating Provider/Extender: Jeri Cos ?Weeks in Treatment: 1 ?Debridement Performed for ?Wound #3 Right,Lateral Foot ?Assessment: ?Performed By: Physician Tommie Sams., PA-C ?Debridement Type: Debridement ?Severity of Tissue Pre Debridement: Other severity specified ?Level of Consciousness (Pre- ?Awake and Alert ?procedure): ?Pre-procedure Verification/Time Out ?Yes - 13:43 ?Taken: ?Start Time: 13:44 ?Pain Control: Lidocaine ?Total Area Debrided (L x W): 5.7 (cm) x 1.8 (cm) = 10.26 (cm?) ?Tissue and other material ?Viable, Non-Viable, [place], Subcutaneous, [place] ?debrided: ?Level: Skin/Subcutaneous Tissue ?Debridement Description: Excisional ?Instrument: Curette ?Bleeding: Minimum ?Hemostasis Achieved: Pressure ?Response to Treatment: Procedure was tolerated well ?Level of Consciousness (Post- ?Awake and Alert ?procedure): ?Post  Debridement Measurements of Total Wound ?Length: (cm) 5.7 ?Width: (cm) 1.8 ?Depth: (cm) 0.3 ?Volume: (cm?) 2.417 ?Character of Wound/Ulcer Post Debridement: Improved ?Severity of Tissue Post Debridement: Other severity specified ?Post Procedure Diagnosis ?Same as Pre-procedure ?Electronic Signature(s) ?Signed: 03/22/2022 5:51:12 PM By: Worthy Keeler PA-C ?Signed: 03/23/2022 4:04:00 PM By: Donnamarie Poag ?Previous Signature: 03/22/2022 2:58:46 PM Version By: Donnamarie Poag ?Entered By: Worthy Keeler on 03/22/2022 17:51:11 ?[name], Jimmy T. ([id number]) ?-------------------------------------------------------------------------------- ?HPI Details ?Patient Name: [name], [name] ?Date of Service: 03/22/2022 1:00 PM ?Medical Record Number: [id number] ?Patient Account Number: [id number] ?Date of Birth/Sex: September 02, 1919 (88 y.o. M) ?Treating RN: Donnamarie Poag ?Primary Care Provider: Jenna Luo Other Clinician: ?Referring Provider: Jenna Luo ?Treating Provider/Extender: Jeri Cos ?Weeks in Treatment: 1 ?History of Present Illness ?Location: left and right dorsum foot and in between his toes ?Quality: Patient reports No Pain. ?Severity: Patient states wound are getting worse. ?Duration: Patient has had the wound for < 2 weeks prior to presenting for treatment ?Timing: Pain in wound is Intermittent (comes and goes ?Context: The wound appeared gradually over time ?Modifying Factors: Other treatment(s) tried include:admitted to the hospital and had a arterial workup and has been on oral antibiotics ?Associated Signs and Symptoms: Patient reports having increase discharge. ?HPI Description: 88 year old gentleman comes from [facility] with a history of having recently been admitted to the hospital ?between February 20 and 01/20/2017, and he was admitted with cellulitis and peripheral vascular disease with possible status dermatitis and an ?open toe wound. he is got a medical history significant of peripheral vascular  disease, hypertension, non-Hodgkin's lymphoma in remission and ?amputation of the left great toe. status post amputation of the left big toe in July 2016 and biopsy of the left neck mass in October 2014. he does ?not smoke at present and given In the 80s. I understand in the past he's had a venous duplex study done and was treated for symptomatic ?varicose veins with compression stockings. ?Patient was treated in hospital with Rocephin and Flagyl and was discharged home on Keflex  and Diflucan for 7 days. ?He had a ABI which showed reduced light flow in the left lower extremity and vascular surgery recommended an outpatient angiogram. right ABI ?was 0.94 with a monophasic flow in the left ABI was 0.67 with a monophasic flow. was seen by the vascular surgeon Dr. Gwenlyn Saran who noted wounds ?of both left and right toes with depressed ABIs bilaterally and objective evidence of chronic venous disease. ?x-ray of the left foot -- IMPRESSION:1. Diffuse soft tissue swelling and osteopenia. No obvious bony destructive process or periosteal reaction. ?2. Patient is status post amputation of the tuft of the first distal phalanx. ?Readmission: ?03-15-2022 upon evaluation today patient appears to be doing poorly in regard to a wound over the lateral portion of his right foot as well as the ?medial portion of his right ankle. Subsequently the ankle actually is doing decently well. The foot is not quite as good. There is a lot of eschar ?noted and necrotic tissue in general. The foot has been present since around the beginning of January the ankle for 2 weeks. The patient likely is ?going require an x-ray and it was done that at this time of the foot I think that would be ideal to ensure there is no obvious signs of osteomyelitis. ?The patient has currently been using Silvadene cream. He is currently 88 years old. He will be 88 on September 17, 2021. With that being said ?based on what we are seeing currently he does have some peripheral  vascular disease I did review his arterial studies performed at vascular and ?it did show that he had a right ABI of 0.76 the left ABI of 0.79 and a right TBI of 0.39 and a left TBI of 0.44. ?The patient does have a history of chronic venous insufficiency, peripheral vascular disease, hypertension, but he is not diabetic. ?03-22-2022 upon evaluation today patient's wound actually is showing signs of improvement which is good news. I do feel like that his blood flow ?is not the absolute best but is also not the absolute worst. He does seem to have some good granulation occurring here and my hope is that we ?will be able to get this to heal without any further significant compromise. Overall the patient seems to be doing quite well. ?Electronic Signature(s) ?Signed: 03/22/2022 5:47:24 PM By: Worthy Keeler PA-C ?Entered By: Worthy Keeler on 03/22/2022 17:47:24 ?[name], Jimmy T. ([id number]) ?-------------------------------------------------------------------------------- ?Physical Exam Details ?Patient Name: [name], [name] ?Date of Service: 03/22/2022 1:00 PM ?Medical Record Number: [id number] ?Patient Account Number: [id number] ?Date of Birth/Sex: [birth year] (88 y.o. M) ?Treating RN: Donnamarie Poag ?Primary Care Provider: Jenna Luo Other Clinician: ?Referring Provider: Jenna Luo ?Treating Provider/Extender: Jeri Cos ?Weeks in Treatment: 1 ?Constitutional ?Well-nourished and well-hydrated in no acute distress. ?Respiratory ?normal breathing without difficulty. ?Psychiatric ?this patient is able to make decisions and demonstrates good insight into disease process. Alert and Oriented x 3. pleasant and cooperative. ?Notes ?Upon inspection patient's wound bed showed signs of good granulation and epithelization for the most part there was some necrotic tissue noted ?currently that I did have to perform a light debridement up to try to clear this away he actually seems to have pretty good blood flow here which ?is  great news. My hope is that this will be sufficient to allow him to heal. I also think he could be a candidate for a skin substitute hopefully will be ?able to get this approved where he can work on that current

## 2022-03-23 NOTE — Progress Notes (Signed)
AMADU, SCHLAGETER T. ([id number]) ?Visit Report for 03/22/2022 ?Arrival Information Details ?Patient Name: [name], [name] ?Date of Service: 03/22/2022 1:00 PM ?Medical Record Number: [id number] ?Patient Account Number: [id number] ?Date of Birth/Sex: [birth year] (88 y.o. M) ?Treating RN: Donnamarie Poag ?Primary Care Ryenne Lynam: Jenna Luo Other Clinician: ?Referring Damira Kem: Jenna Luo ?Treating Verlisa Vara/Extender: Jeri Cos ?Weeks in Treatment: 1 ?Visit Information History Since Last Visit ?Added or deleted any medications: No ?Patient Arrived: Wheel Chair ?Had a fall or experienced change in No ?Arrival Time: 13:05 ?activities of daily living that may affect ?Accompanied By: daughter ?risk of falls: ?Transfer Assistance: [employer] Patient Lift ?Hospitalized since last visit: No ?Patient Identification Verified: Yes ?Has Dressing in Place as Prescribed: Yes ?Secondary Verification Process Completed: Yes ?Pain Present Now: No ?Patient Requires Transmission-Based No ?Precautions: ?Patient Has Alerts: Yes ?Patient Alerts: Patient on Blood ?Thinner ?12/31/21 ABI L 0.79 R ?0.76 ?not Diabetic ?Electronic Signature(s) ?Signed: 03/22/2022 2:58:46 PM By: Donnamarie Poag ?Entered ByDonnamarie Poag on 03/22/2022 13:10:58 ?Granby, Amaar T. ([id number]) ?-------------------------------------------------------------------------------- ?Encounter Discharge Information Details ?Patient Name: [name], [name] ?Date of Service: 03/22/2022 1:00 PM ?Medical Record Number: [id number] ?Patient Account Number: [id number] ?Date of Birth/Sex: Dec 30, 1918 (88 y.o. M) ?Treating RN: Donnamarie Poag ?Primary Care Lasheika Ortloff: Jenna Luo Other Clinician: ?Referring Ariea Rochin: Jenna Luo ?Treating Altamese Deguire/Extender: Jeri Cos ?Weeks in Treatment: 1 ?Encounter Discharge Information Items Post Procedure Vitals ?Discharge Condition: Stable ?Temperature (?F): 96.1 ?Ambulatory Status: Wheelchair ?Pulse (bpm): 92 ?Discharge Destination: Home ?Respiratory Rate  (breaths/min): 16 ?Transportation: Private Auto ?Blood Pressure (mmHg): 138/80 ?Accompanied By: daughter ?Schedule Follow-up Appointment: Yes ?Clinical Summary of Care: ?Electronic Signature(s) ?Signed: 03/22/2022 2:58:46 PM By: Donnamarie Poag ?Entered By: Donnamarie Poag on 03/22/2022 14:02:10 ?Tickner, Dolly Rias ([id number]) ?-------------------------------------------------------------------------------- ?Lower Extremity Assessment Details ?Patient Name: [name], [name] ?Date of Service: 03/22/2022 1:00 PM ?Medical Record Number: [id number] ?Patient Account Number: [id number] ?Date of Birth/Sex: 07/18/1919 (88 y.o. M) ?Treating RN: Donnamarie Poag ?Primary Care Gianny Killman: Jenna Luo Other Clinician: ?Referring Ayda Tancredi: Jenna Luo ?Treating Biviana Saddler/Extender: Jeri Cos ?Weeks in Treatment: 1 ?Edema Assessment ?Assessed: [Left: No] [Right: Yes] ?Edema: [Left: Ye] [Right: s] ?Calf ?Left: Right: ?Point of Measurement: 35 cm From Medial Instep 38.5 cm ?Ankle ?Left: Right: ?Point of Measurement: 12 cm From Medial Instep 27 cm ?Electronic Signature(s) ?Signed: 03/22/2022 2:58:46 PM By: Donnamarie Poag ?Entered ByDonnamarie Poag on 03/22/2022 13:23:41 ?Almont, Cledis T. ([id number]) ?-------------------------------------------------------------------------------- ?Multi Wound Chart Details ?Patient Name: [name], [name] ?Date of Service: 03/22/2022 1:00 PM ?Medical Record Number: [id number] ?Patient Account Number: [id number] ?Date of Birth/Sex: 10-Aug-1919 (88 y.o. M) ?Treating RN: Donnamarie Poag ?Primary Care Tracie Lindbloom: Jenna Luo Other Clinician: ?Referring Aidon [name]: Jenna Luo ?Treating Afsheen Antony/Extender: Jeri Cos ?Weeks in Treatment: 1 ?Vital Signs ?Height(in): 73 ?Pulse(bpm): 62 ?Weight(lbs): 200 ?Blood Pressure(mmHg): 135/80 ?Body Mass Index(BMI): 26.4 ?Temperature(??F): 96.1 ?Respiratory Rate(breaths/min): 16 ?Photos: [N/A:N/A] ?Wound Location: Right, Lateral Foot Right, Medial Ankle N/A ?Wounding Event: Gradually Appeared  Gradually Appeared N/A ?Primary Etiology: Venous Leg Ulcer Venous Leg Ulcer N/A ?Comorbid History: Cataracts, Hypertension, Peripheral Cataracts, Hypertension, Peripheral N/A ?Venous Disease, Received Venous Disease, Received ?Chemotherapy, Received Radiation Chemotherapy, Received Radiation ?Date Acquired: 11/28/2021 03/01/2022 N/A ?Weeks of Treatment: 1 1 N/A ?Wound Status: Open Open N/A ?Wound Recurrence: No No N/A ?Measurements L x W x D (cm) 5.7x1.8x0.3 0.9x0.9x0.2 N/A ?Area (cm?) : 8.058 0.636 N/A ?Volume (cm?) : 2.417 0.127 N/A ?% Reduction in Area: -16.60% 19.00% N/A ?% Reduction in Volume: -16.60% 19.10% N/A ?Classification: Full Thickness With Exposed Full Thickness Without Exposed N/A ?Support Structures Support Structures ?Exudate Amount:  Medium Medium N/A ?Exudate Type: Serosanguineous Serosanguineous N/A ?Exudate Color: red, brown red, brown N/A ?Granulation Amount: Medium (34-66%) Small (1-33%) N/A ?Granulation Quality: Red, Pink Red N/A ?Necrotic Amount: Medium (34-66%) Medium (34-66%) N/A ?Exposed Structures: ?Fat Layer (Subcutaneous Tissue): ?Fat Layer (Subcutaneous Tissue): N/A ?Yes Yes ?Tendon: Yes ?Epithelialization: None None N/A ?Treatment Notes ?Electronic Signature(s) ?Signed: 03/22/2022 2:58:46 PM By: Donnamarie Poag ?Entered ByDonnamarie Poag on 03/22/2022 13:25:45 ?Gross, Lacharles T. ([id number]) ?-------------------------------------------------------------------------------- ?Multi-Disciplinary Care Plan Details ?Patient Name: [name], [name] ?Date of Service: 03/22/2022 1:00 PM ?Medical Record Number: [id number] ?Patient Account Number: [id number] ?Date of Birth/Sex: [birth year] (88 y.o. M) ?Treating RN: Donnamarie Poag ?Primary Care Nabila Albarracin: Jenna Luo Other Clinician: ?Referring Nyron Mozer: Jenna Luo ?Treating Vivion Romano/Extender: Jeri Cos ?Weeks in Treatment: 1 ?Active Inactive ?Wound/Skin Impairment ?Nursing Diagnoses: ?Impaired tissue integrity ?Knowledge deficit related to  ulceration/compromised skin integrity ?Goals: ?Ulcer/skin breakdown will have a volume reduction of 30% by week 4 ?Date Initiated: 03/15/2022 ?Target Resolution Date: 04/12/2022 ?Goal Status: Active ?Ulcer/skin breakdown will have a volume reduction of 50% by week 8 ?Date Initiated: 03/15/2022 ?Target Resolution Date: 05/10/2022 ?Goal Status: Active ?Ulcer/skin breakdown will have a volume reduction of 80% by week 12 ?Date Initiated: 03/15/2022 ?Target Resolution Date: 06/07/2022 ?Goal Status: Active ?Ulcer/skin breakdown will heal within 14 weeks ?Date Initiated: 03/15/2022 ?Target Resolution Date: 06/21/2022 ?Goal Status: Active ?Interventions: ?Assess patient/caregiver ability to obtain necessary supplies ?Assess patient/caregiver ability to perform ulcer/skin care regimen upon admission and as needed ?Assess ulceration(s) every visit ?Provide education on ulcer and skin care ?Notes: ?Electronic Signature(s) ?Signed: 03/22/2022 2:58:46 PM By: Donnamarie Poag ?Entered ByDonnamarie Poag on 03/22/2022 13:25:23 ?Java, Nathanuel T. ([id number]) ?-------------------------------------------------------------------------------- ?Non-Wound Condition Assessment Details ?Patient Name: [name], [name] ?Date of Service: 03/22/2022 1:00 PM ?Medical Record Number: [id number] ?Patient Account Number: [id number] ?Date of Birth/Sex: 12/08/1918 (88 y.o. M) ?Treating RN: Donnamarie Poag ?Primary Care Dow Blahnik: Jenna Luo Other Clinician: ?Referring Coralyn Roselli: Jenna Luo ?Treating Berdell Nevitt/Extender: Jeri Cos ?Weeks in Treatment: 1 ?Non-Wound Condition: ?Condition: Other Dermatologic Condition ?Location: Ankle ?Side: Right ?Notes: posterior ?Photos ?Electronic Signature(s) ?Signed: 03/22/2022 2:58:46 PM By: Donnamarie Poag ?Entered By: Donnamarie Poag on 03/22/2022 13:23:01 ?Breiner, Dolly Rias ([id number]) ?-------------------------------------------------------------------------------- ?Pain Assessment Details ?Patient Name: [name], [name] ?Date of Service:  03/22/2022 1:00 PM ?Medical Record Number: [id number] ?Patient Account Number: [id number] ?Date of Birth/Sex: [birth year] (88 y.o. M) ?Treating RN: Donnamarie Poag ?Primary Care Prudie Guthridge: Jenna Luo Other Clinician: ?Referring Vicki Pasqual:

## 2022-03-29 ENCOUNTER — Encounter: Payer: Medicare Other | Attending: Physician Assistant | Admitting: Physician Assistant

## 2022-03-29 ENCOUNTER — Telehealth: Payer: Self-pay

## 2022-03-29 ENCOUNTER — Other Ambulatory Visit: Payer: Self-pay | Admitting: Family Medicine

## 2022-03-29 DIAGNOSIS — L97312 Non-pressure chronic ulcer of right ankle with fat layer exposed: Secondary | ICD-10-CM | POA: Diagnosis not present

## 2022-03-29 DIAGNOSIS — I872 Venous insufficiency (chronic) (peripheral): Secondary | ICD-10-CM | POA: Diagnosis not present

## 2022-03-29 DIAGNOSIS — I10 Essential (primary) hypertension: Secondary | ICD-10-CM | POA: Insufficient documentation

## 2022-03-29 DIAGNOSIS — I7389 Other specified peripheral vascular diseases: Secondary | ICD-10-CM | POA: Insufficient documentation

## 2022-03-29 DIAGNOSIS — Z89412 Acquired absence of left great toe: Secondary | ICD-10-CM | POA: Insufficient documentation

## 2022-03-29 DIAGNOSIS — L97512 Non-pressure chronic ulcer of other part of right foot with fat layer exposed: Secondary | ICD-10-CM | POA: Diagnosis not present

## 2022-03-29 NOTE — Progress Notes (Addendum)
[name], Jimmy T. ([id number]) ?Visit Report for 03/29/2022 ?Chief Complaint Document Details ?Patient Name: [name] ?Date of Service: 03/29/2022 2:15 PM ?Medical Record Number: [id number] ?Patient Account Number: [id number] ?Date of Birth/Sex: 12/19/1918 (88 y.o. M) ?Treating RN: Levora Dredge ?Primary Care Provider: Jenna Luo Other Clinician: ?Referring Provider: Jenna Luo ?Treating Provider/Extender: Jeri Cos ?Weeks in Treatment: 2 ?Information Obtained from: Patient ?Chief Complaint ?Right foot and ankle ulcers ?Electronic Signature(s) ?Signed: 03/29/2022 2:20:26 PM By: Worthy Keeler PA-C ?Entered By: Worthy Keeler on 03/29/2022 14:20:26 ?Rubens, Dolly Rias ([id number]) ?-------------------------------------------------------------------------------- ?Debridement Details ?Patient Name: [name] ?Date of Service: 03/29/2022 2:15 PM ?Medical Record Number: [id number] ?Patient Account Number: [id number] ?Date of Birth/Sex: 11-01-1919 (88 y.o. M) ?Treating RN: Levora Dredge ?Primary Care Provider: Jenna Luo Other Clinician: ?Referring Provider: Jenna Luo ?Treating Provider/Extender: Jeri Cos ?Weeks in Treatment: 2 ?Debridement Performed for ?Wound #3 Right,Lateral Foot ?Assessment: ?Performed By: Physician Tommie Sams., PA-C ?Debridement Type: Debridement ?Severity of Tissue Pre Debridement: Fat layer exposed ?Level of Consciousness (Pre- ?Awake and Alert ?procedure): ?Pre-procedure Verification/Time Out ?Yes - 15:00 ?Taken: ?Pain Control: Lidocaine 4% Topical Solution ?Total Area Debrided (L x W): 1.9 (cm) x 5.5 (cm) = 10.45 (cm?) ?Tissue and other material ?Viable, Non-Viable, Slough, Subcutaneous, [place] ?debrided: ?Level: Skin/Subcutaneous Tissue ?Debridement Description: Excisional ?Instrument: Curette ?Bleeding: Moderate ?Hemostasis Achieved: Silver Nitrate ?Response to Treatment: Procedure was tolerated well ?Level of Consciousness (Post- ?Awake and Alert ?procedure): ?Post  Debridement Measurements of Total Wound ?Length: (cm) 1.9 ?Width: (cm) 5.5 ?Depth: (cm) 1 ?Volume: (cm?) 8.207 ?Character of Wound/Ulcer Post Debridement: Stable ?Severity of Tissue Post Debridement: Fat layer exposed ?Post Procedure Diagnosis ?Same as Pre-procedure ?Notes ?2 sticks silver nitrate used ?Electronic Signature(s) ?Signed: 03/29/2022 3:42:06 PM By: Levora Dredge ?Signed: 03/29/2022 4:38:50 PM By: Worthy Keeler PA-C ?Entered By: Levora Dredge on 03/29/2022 15:42:06 ?[name], Jimmy T. ([id number]) ?-------------------------------------------------------------------------------- ?HPI Details ?Patient Name: [name] ?Date of Service: 03/29/2022 2:15 PM ?Medical Record Number: [id number] ?Patient Account Number: [id number] ?Date of Birth/Sex: 1919-02-10 (88 y.o. M) ?Treating RN: Levora Dredge ?Primary Care Provider: Jenna Luo Other Clinician: ?Referring Provider: Jenna Luo ?Treating Provider/Extender: Jeri Cos ?Weeks in Treatment: 2 ?History of Present Illness ?Location: left and right dorsum foot and in between his toes ?Quality: Patient reports No Pain. ?Severity: Patient states wound are getting worse. ?Duration: Patient has had the wound for < 2 weeks prior to presenting for treatment ?Timing: Pain in wound is Intermittent (comes and goes ?Context: The wound appeared gradually over time ?Modifying Factors: Other treatment(s) tried include:admitted to the hospital and had a arterial workup and has been on oral antibiotics ?Associated Signs and Symptoms: Patient reports having increase discharge. ?HPI Description: 88 year old gentleman comes [facility] with a history of having recently been admitted to the hospital ?between February 20 and 01/20/2017, and he was admitted with cellulitis and peripheral vascular disease with possible status dermatitis and an ?open toe wound. he is got a medical history significant of peripheral vascular disease, hypertension, non-Hodgkin's  lymphoma in remission and ?amputation of the left great toe. status post amputation of the left big toe in July 2016 and biopsy of the left neck mass in October 2014. he does ?not smoke at present and given In the 80s. I understand in the past he's had a venous duplex study done and was treated for symptomatic ?varicose veins with compression stockings. ?Patient was treated in hospital with Rocephin and Flagyl and was discharged home on Keflex and Diflucan for  7 days. ?He had a ABI which showed reduced light flow in the left lower extremity and vascular surgery recommended an outpatient angiogram. right ABI ?was 0.94 with a monophasic flow in the left ABI was 0.67 with a monophasic flow. was seen by the vascular surgeon Dr. Gwenlyn Saran who noted wounds ?of both left and right toes with depressed ABIs bilaterally and objective evidence of chronic venous disease. ?x-ray of the left foot -- IMPRESSION:1. Diffuse soft tissue swelling and osteopenia. No obvious bony destructive process or periosteal reaction. ?2. Patient is status post amputation of the tuft of the first distal phalanx. ?Readmission: ?03-15-2022 upon evaluation today patient appears to be doing poorly in regard to a wound over the lateral portion of his right foot as well as the ?medial portion of his right ankle. Subsequently the ankle actually is doing decently well. The foot is not quite as good. There is a lot of eschar ?noted and necrotic tissue in general. The foot has been present since around the beginning of January the ankle for 2 weeks. The patient likely is ?going require an x-ray and it was done that at this time of the foot I think that would be ideal to ensure there is no obvious signs of osteomyelitis. ?The patient has currently been using Silvadene cream. He is currently 88 years old. He will be 89 on September 17, 2019. With that being said ?based on what we are seeing currently he does have some peripheral vascular disease I did review his  arterial studies performed at vascular and ?it did show that he had a right ABI of 0.76 the left ABI of 0.79 and a right TBI of 0.39 and a left TBI of 0.44. ?The patient does have a history of chronic venous insufficiency, peripheral vascular disease, hypertension, but he is not diabetic. ?03-22-2022 upon evaluation today patient's wound actually is showing signs of improvement which is good news. I do feel like that his blood flow ?is not the absolute best but is also not the absolute worst. He does seem to have some good granulation occurring here and my hope is that we ?will be able to get this to heal without any further significant compromise. Overall the patient seems to be doing quite well. ?03-29-2022 upon evaluation today patient appears to be doing well currently in regard to his wounds. He has been tolerating the dressing changes ?without complication. Fortunately I do not see any evidence of active infection locally or systemically which is great news. I did have to perform ?some debridement regard to the lateral foot he tolerated that without complication there is tendon exposed I do believe we need to switch at this ?point to a different dressing to try to see if we can help with some additional granulation growth. ?Electronic Signature(s) ?Signed: 03/29/2022 3:33:16 PM By: Worthy Keeler PA-C ?Entered By: Worthy Keeler on 03/29/2022 15:33:16 ?[name] ([id number]) ?-------------------------------------------------------------------------------- ?Physical Exam Details ?Patient Name: [name] ?Date of Service: 03/29/2022 2:15 PM ?Medical Record Number: [id number] ?Patient Account Number: [id number] ?Date of Birth/Sex: November 05, 1919 (88 y.o. M) ?Treating RN: Levora Dredge ?Primary Care Provider: Jenna Luo Other Clinician: ?Referring Provider: Jenna Luo ?Treating Provider/Extender: Jeri Cos ?Weeks in Treatment: 2 ?Constitutional ?Well-nourished and well-hydrated in no acute  distress. ?Respiratory ?normal breathing without difficulty. ?Psychiatric ?this patient is able to make decisions and demonstrates good insight into disease process. Alert and Oriented x 3. pleasant and cooperative. ?

## 2022-03-29 NOTE — Progress Notes (Signed)
CHANZ, CAHALL T. ([id number]) ?Visit Report for 03/29/2022 ?Arrival Information Details ?Patient Name: [name] ?Date of Service: 03/29/2022 2:15 PM ?Medical Record Number: [id number] ?Patient Account Number: [id number] ?Date of Birth/Sex: 1918-12-07 (88 y.o. M) ?Treating RN: Levora Dredge ?Primary Care Veanna Dower: Jenna Luo Other Clinician: ?Referring Joahan Swatzell: Jenna Luo ?Treating Antoria Lanza/Extender: Jeri Cos ?Weeks in Treatment: 2 ?Visit Information History Since Last Visit ?Added or deleted any medications: No ?Patient Arrived: Wheel Chair ?Any new allergies or adverse reactions: No ?Arrival Time: 14:10 ?Had a fall or experienced change in No ?Accompanied By: family ?activities of daily living that may affect ?Transfer Assistance: EasyPivot Patient Lift ?risk of falls: ?Patient Identification Verified: Yes ?Hospitalized since last visit: No ?Secondary Verification Process Completed: Yes ?Has Dressing in Place as Prescribed: Yes ?Patient Requires Transmission-Based No ?Pain Present Now: No ?Precautions: ?Patient Has Alerts: Yes ?Patient Alerts: Patient on Blood ?Thinner ?12/31/21 ABI L 0.79 R ?0.76 ?not Diabetic ?Electronic Signature(s) ?Signed: 03/29/2022 5:01:14 PM By: Levora Dredge ?Entered By: Levora Dredge on 03/29/2022 14:15:43 ?Alicia, Aikeem T. ([id number]) ?-------------------------------------------------------------------------------- ?Clinic Level of Care Assessment Details ?Patient Name: [name] ?Date of Service: 03/29/2022 2:15 PM ?Medical Record Number: [id number] ?Patient Account Number: [id number] ?Date of Birth/Sex: [birth year] (88 y.o. M) ?Treating RN: Levora Dredge ?Primary Care Rynell Ciotti: Jenna Luo Other Clinician: ?Referring Sheron Tallman: Jenna Luo ?Treating Kolin Erdahl/Extender: Jeri Cos ?Weeks in Treatment: 2 ?Clinic Level of Care Assessment Items ?TOOL 1 Quantity Score ?'[]'$  - Use when EandM and Procedure is performed on INITIAL visit 0 ?ASSESSMENTS - Nursing Assessment /  Reassessment ?'[]'$  - General Physical Exam (combine w/ comprehensive assessment (listed just below) when performed on new ?0 ?pt. evals) ?'[]'$  - 0 ?Comprehensive Assessment (HX, ROS, Risk Assessments, Wounds Hx, etc.) ?ASSESSMENTS - Wound and Skin Assessment / Reassessment ?'[]'$  - Dermatologic / Skin Assessment (not related to wound area) 0 ?ASSESSMENTS - Ostomy and/or Continence Assessment and Care ?'[]'$  - Incontinence Assessment and Management 0 ?'[]'$  - 0 ?Ostomy Care Assessment and Management (repouching, etc.) ?PROCESS - Coordination of Care ?'[]'$  - Simple Patient / Family Education for ongoing care 0 ?'[]'$  - 0 ?Complex (extensive) Patient / Family Education for ongoing care ?'[]'$  - 0 ?Staff obtains Consents, Records, Test Results / Process Orders ?'[]'$  - 0 ?Staff telephones HHA, Nursing Homes / Clarify orders / etc ?'[]'$  - 0 ?Routine Transfer to another Facility (non-emergent condition) ?'[]'$  - 0 ?Routine Hospital Admission (non-emergent condition) ?'[]'$  - 0 ?New Admissions / Biomedical engineer / Ordering NPWT, Apligraf, etc. ?'[]'$  - 0 ?Emergency Hospital Admission (emergent condition) ?PROCESS - Special Needs ?'[]'$  - Pediatric / Minor Patient Management 0 ?'[]'$  - 0 ?Isolation Patient Management ?'[]'$  - 0 ?Hearing / Language / Visual special needs ?'[]'$  - 0 ?Assessment of Community assistance (transportation, D/C planning, etc.) ?'[]'$  - 0 ?Additional assistance / Altered mentation ?'[]'$  - 0 ?Support Surface(s) Assessment (bed, cushion, seat, etc.) ?INTERVENTIONS - Miscellaneous ?'[]'$  - External ear exam 0 ?'[]'$  - 0 ?Patient Transfer (multiple staff / Civil Service fast streamer / Similar devices) ?'[]'$  - 0 ?Simple Staple / Suture removal (25 or less) ?'[]'$  - 0 ?Complex Staple / Suture removal (26 or more) ?'[]'$  - 0 ?Hypo/Hyperglycemic Management (do not check if billed separately) ?'[]'$  - 0 ?Ankle / Brachial Index (ABI) - do not check if billed separately ?Has the patient been seen at the hospital within the last three years: Yes ?Total Score: 0 ?Level Of Care:  ____ ?DIANNE, BADY T. ([id number]) ?Electronic Signature(s) ?Signed: 03/29/2022 5:01:14 PM By: Levora Dredge ?Entered By: Levora Dredge  on 03/29/2022 15:42:12 ?[place], Jodi T. ([id number]) ?-------------------------------------------------------------------------------- ?Encounter Discharge Information Details ?Patient Name: [name] ?Date of Service: 03/29/2022 2:15 PM ?Medical Record Number: [id number] ?Patient Account Number: [id number] ?Date of Birth/Sex: [birth year] (88 y.o. M) ?Treating RN: Levora Dredge ?Primary Care Robinson Brinkley: Jenna Luo Other Clinician: ?Referring Marget Outten: Jenna Luo ?Treating Brixton Franko/Extender: Jeri Cos ?Weeks in Treatment: 2 ?Encounter Discharge Information Items Post Procedure Vitals ?Discharge Condition: Stable ?Temperature (?F): 97.4 ?Ambulatory Status: Wheelchair ?Pulse (bpm): 69 ?Discharge Destination: Home ?Respiratory Rate (breaths/min): 18 ?Transportation: Private Auto ?Blood Pressure (mmHg): 126/74 ?Accompanied By: daughter ?Schedule Follow-up Appointment: Yes ?Clinical Summary of Care: Patient Declined ?Electronic Signature(s) ?Signed: 03/29/2022 3:43:33 PM By: Levora Dredge ?Entered By: Levora Dredge on 03/29/2022 15:43:32 ?[place], Tyr T. ([id number]) ?-------------------------------------------------------------------------------- ?Lower Extremity Assessment Details ?Patient Name: [name] ?Date of Service: 03/29/2022 2:15 PM ?Medical Record Number: [id number] ?Patient Account Number: [id number] ?Date of Birth/Sex: 1919-04-02 (88 y.o. M) ?Treating RN: Levora Dredge ?Primary Care Feliberto Stockley: Jenna Luo Other Clinician: ?Referring Lani Mendiola: Jenna Luo ?Treating Damonie Furney/Extender: Jeri Cos ?Weeks in Treatment: 2 ?Edema Assessment ?Assessed: [Left: No] [Right: No] ?Edema: [Left: Ye] [Right: s] ?Calf ?Left: Right: ?Point of Measurement: 35 cm From Medial Instep 38 cm ?Ankle ?Left: Right: ?Point of Measurement: 12 cm From Medial Instep 25 cm ?Vascular  Assessment ?Pulses: ?Dorsalis Pedis ?Palpable: [Right:Yes] ?Electronic Signature(s) ?Signed: 03/29/2022 5:01:14 PM By: Levora Dredge ?Entered By: Levora Dredge on 03/29/2022 14:31:13 ?[place], Braun T. ([id number]) ?-------------------------------------------------------------------------------- ?Multi Wound Chart Details ?Patient Name: [name] ?Date of Service: 03/29/2022 2:15 PM ?Medical Record Number: [id number] ?Patient Account Number: [id number] ?Date of Birth/Sex: 01/22/1919 (88 y.o. M) ?Treating RN: Levora Dredge ?Primary Care Brocha Gilliam: Jenna Luo Other Clinician: ?Referring Jeovani Weisenburger: Jenna Luo ?Treating Mellina Benison/Extender: Jeri Cos ?Weeks in Treatment: 2 ?Vital Signs ?Height(in): 73 ?Pulse(bpm): 69 ?Weight(lbs): 200 ?Blood Pressure(mmHg): 126/74 ?Body Mass Index(BMI): 26.4 ?Temperature(??F): 97.4 ?Respiratory Rate(breaths/min): 18 ?Photos: [N/A:N/A] ?Wound Location: Right, Lateral Foot Right, Medial Ankle N/A ?Wounding Event: Gradually Appeared Gradually Appeared N/A ?Primary Etiology: Venous Leg Ulcer Venous Leg Ulcer N/A ?Comorbid History: Cataracts, Hypertension, Peripheral Cataracts, Hypertension, Peripheral N/A ?Venous Disease, Received Venous Disease, Received ?Chemotherapy, Received Radiation Chemotherapy, Received Radiation ?Date Acquired: 11/28/2021 03/01/2022 N/A ?Weeks of Treatment: 2 2 N/A ?Wound Status: Open Open N/A ?Wound Recurrence: No No N/A ?Measurements L x W x D (cm) 1.9x5.5x1 0.8x0.8x0.2 N/A ?Area (cm?) : 8.207 0.503 N/A ?Volume (cm?) : 8.207 0.101 N/A ?% Reduction in Area: -18.70% 35.90% N/A ?% Reduction in Volume: -295.90% 35.70% N/A ?Classification: Full Thickness With Exposed Full Thickness Without Exposed N/A ?Support Structures Support Structures ?Exudate Amount: Medium Medium N/A ?Exudate Type: Serosanguineous Serosanguineous N/A ?Exudate Color: red, brown red, brown N/A ?Granulation Amount: Medium (34-66%) Medium (34-66%) N/A ?Granulation Quality: Red, Pink Red  N/A ?Necrotic Amount: Medium (34-66%) Medium (34-66%) N/A ?Exposed Structures: ?Fat Layer (Subcutaneous Tissue): ?Fat Layer (Subcutaneous Tissue): N/A ?Yes Yes ?Tendon: Yes ?Epithelialization: None Small (1-33%)

## 2022-03-29 NOTE — Telephone Encounter (Signed)
Spoke with pt's daughter this morning. Daughter wants to know if you can send in an order for PT to help with pt's mobility/leg strength. ? ?Please advice ?

## 2022-03-30 DIAGNOSIS — L97512 Non-pressure chronic ulcer of other part of right foot with fat layer exposed: Secondary | ICD-10-CM | POA: Diagnosis not present

## 2022-03-30 DIAGNOSIS — I10 Essential (primary) hypertension: Secondary | ICD-10-CM | POA: Diagnosis not present

## 2022-04-05 ENCOUNTER — Encounter: Payer: Medicare Other | Admitting: Physician Assistant

## 2022-04-05 DIAGNOSIS — I7389 Other specified peripheral vascular diseases: Secondary | ICD-10-CM | POA: Diagnosis not present

## 2022-04-05 DIAGNOSIS — I10 Essential (primary) hypertension: Secondary | ICD-10-CM | POA: Diagnosis not present

## 2022-04-05 DIAGNOSIS — I872 Venous insufficiency (chronic) (peripheral): Secondary | ICD-10-CM | POA: Diagnosis not present

## 2022-04-05 DIAGNOSIS — L97512 Non-pressure chronic ulcer of other part of right foot with fat layer exposed: Secondary | ICD-10-CM | POA: Diagnosis not present

## 2022-04-05 DIAGNOSIS — Z89412 Acquired absence of left great toe: Secondary | ICD-10-CM | POA: Diagnosis not present

## 2022-04-05 DIAGNOSIS — L97515 Non-pressure chronic ulcer of other part of right foot with muscle involvement without evidence of necrosis: Secondary | ICD-10-CM | POA: Diagnosis not present

## 2022-04-05 DIAGNOSIS — L97312 Non-pressure chronic ulcer of right ankle with fat layer exposed: Secondary | ICD-10-CM | POA: Diagnosis not present

## 2022-04-05 NOTE — Progress Notes (Addendum)
[name], Jimmy T. ([id number]) ?Visit Report for 04/05/2022 ?Chief Complaint Document Rodriguez ?Patient Name: [name] ?Date of Service: 04/05/2022 2:15 PM ?Medical Record Number: [id number] ?Patient Account Number: [id number] ?Date of Birth/Sex: [birth year] (88 y.o. M) ?Treating RN: Levora Dredge ?Primary Care Provider: Jenna Luo Other Clinician: ?Referring Provider: Jenna Luo ?Treating Provider/Extender: Jeri Cos ?Weeks in Treatment: 3 ?Information Obtained from: Patient ?Chief Complaint ?Right foot and ankle ulcers ?Electronic Signature(s) ?Signed: 04/05/2022 2:35:49 PM By: Worthy Keeler PA-C ?Entered By: Worthy Keeler on 04/05/2022 14:35:49 ?[name], Jimmy T. ([id number]) ?-------------------------------------------------------------------------------- ?Cellular or Tissue Based Product Rodriguez ?Patient Name: [name] ?Date of Service: 04/05/2022 2:15 PM ?Medical Record Number: [id number] ?Patient Account Number: [id number] ?Date of Birth/Sex: 1919/07/23 (88 y.o. M) ?Treating RN: Levora Dredge ?Primary Care Provider: Jenna Luo Other Clinician: ?Referring Provider: Jenna Luo ?Treating Provider/Extender: Jeri Cos ?Weeks in Treatment: 3 ?Cellular or Tissue Based Product Type Wound #3 Right,Lateral Foot ?Applied to: ?Performed By: Physician Tommie Sams., PA-C ?Cellular or Tissue Based Product [place] ?Type: ?Level of Consciousness (Pre- Awake and Alert ?procedure): ?Pre-procedure Verification/Time Out Yes - 14:50 ?Taken: ?Location: genitalia / hands / feet / multiple digits ?Wound Size (sq cm): 11.4 ?Product Size (sq cm): 12 ?Waste Size (sq cm): 1 ?Waste Reason: wound size ?Amount of Product Applied ?(sq cm): 11 ?Instrument Used: Forceps, Scissors ?Lot #: [id number] ?Order #: [id number] ?Expiration Date: 08/28/2026 ?Fenestrated: No ?Reconstituted: Yes ?Solution Type: saline ?Solution Amount: 2 ?Lot #: [id number] ?Solution Expiration Date: 11/29/2023 ?Secured: Yes ?Secured With:  Steri-Strips ?Dressing Applied: Yes ?Primary Dressing: mepitel one ?Procedural Pain: 0 ?Post Procedural Pain: 0 ?Response to Treatment: Procedure was tolerated well ?Level of Consciousness (Post- Awake and Alert ?procedure): ?Post Procedure Diagnosis ?Same as Pre-procedure ?Electronic Signature(s) ?Signed: 04/05/2022 4:57:35 PM By: Levora Dredge ?Entered By: Levora Dredge on 04/05/2022 14:58:37 ?[name], Jimmy T. ([id number]) ?-------------------------------------------------------------------------------- ?Cellular or Tissue Based Product Rodriguez ?Patient Name: [name] ?Date of Service: 04/05/2022 2:15 PM ?Medical Record Number: [id number] ?Patient Account Number: [id number] ?Date of Birth/Sex: Aug 03, 1919 (88 y.o. M) ?Treating RN: Levora Dredge ?Primary Care Provider: Jenna Luo Other Clinician: ?Referring Provider: Jenna Luo ?Treating Provider/Extender: Jeri Cos ?Weeks in Treatment: 3 ?Cellular or Tissue Based Product Type Wound #4 Right,Medial Ankle ?Applied to: ?Performed By: Physician Tommie Sams., PA-C ?Cellular or Tissue Based Product N[place]ield ?Type: ?Level of Consciousness (Pre- Awake and Alert ?procedure): ?Pre-procedure Verification/Time Out Yes - 14:58 ?Taken: ?Location: genitalia / hands / feet / multiple digits ?Wound Size (sq cm): 0.72 ?Product Size (sq cm): 1 ?Waste Size (sq cm): 0 ?Amount of Product Applied ?(sq cm): 1 ?Instrument Used: Forceps, Scissors ?Lot #: [id number] ?Order #: [id number] ?Expiration Date: 08/28/2026 ?Fenestrated: No ?Reconstituted: Yes ?Solution Type: saline ?Solution Amount: 2 ?Lot #: [id number] ?Solution Expiration Date: 11/29/2023 ?Secured: Yes ?Secured With: Steri-Strips ?Dressing Applied: Yes ?Primary Dressing: mepitel one ?Procedural Pain: 0 ?Post Procedural Pain: 0 ?Response to Treatment: Procedure was tolerated well ?Level of Consciousness (Post- Awake and Alert ?procedure): ?Post Procedure Diagnosis ?Same as Pre-procedure ?Electronic Signature(s) ?Signed:  04/05/2022 4:57:35 PM By: Levora Dredge ?Entered By: Levora Dredge on 04/05/2022 15:07:17 ?[name], Jimmy T. ([id number]) ?-------------------------------------------------------------------------------- ?Debridement Rodriguez ?Patient Name: [name] ?Date of Service: 04/05/2022 2:15 PM ?Medical Record Number: [id number] ?Patient Account Number: [id number] ?Date of Birth/Sex: 01/30/1919 (88 y.o. M) ?Treating RN: Levora Dredge ?Primary Care Provider: Jenna Luo Other Clinician: ?Referring Provider: Jenna Luo ?Treating Provider/Extender: Jeri Cos ?Weeks in Treatment: 3 ?Debridement Performed for ?Wound #3 Right,Lateral Foot ?Assessment: ?Performed By: Physician  Tommie Sams., PA-C ?Debridement Type: Debridement ?Severity of Tissue Pre Debridement: Muscle involvement without necrosis ?Level of Consciousness (Pre- ?Awake and Alert ?procedure): ?Pre-procedure Verification/Time Out ?Yes - 14:41 ?Taken: ?Total Area Debrided (L x W): 2 (cm) x 5.7 (cm) = 11.4 (cm?) ?Tissue and other material ?Viable, Non-Viable, Slough, Subcutaneous, [place] ?debrided: ?Level: Skin/Subcutaneous Tissue ?Debridement Description: Excisional ?Instrument: Curette ?Bleeding: Moderate ?Hemostasis Achieved: Silver Nitrate ?Response to Treatment: Procedure was tolerated well ?Level of Consciousness (Post- ?Awake and Alert ?procedure): ?Post Debridement Measurements of Total Wound ?Length: (cm) 2 ?Width: (cm) 5.7 ?Depth: (cm) 0.6 ?Volume: (cm?) 5.372 ?Character of Wound/Ulcer Post Debridement: Stable ?Severity of Tissue Post Debridement: Muscle involvement without necrosis ?Post Procedure Diagnosis ?Same as Pre-procedure ?Electronic Signature(s) ?Signed: 04/06/2022 8:44:28 AM By: Levora Dredge ?Signed: 04/08/2022 8:03:27 AM By: Worthy Keeler PA-C ?Previous Signature: 04/05/2022 4:57:35 PM Version By: Levora Dredge ?Previous Signature: 04/05/2022 5:21:07 PM Version By: Worthy Keeler PA-C ?Entered By: Levora Dredge on 04/06/2022  08:44:28 ?[name], Jimmy T. ([id number]) ?-------------------------------------------------------------------------------- ?[name] ?Patient Name: [name] ?Date of Service: 04/05/2022 2:15 PM ?Medical Record Number: [id number] ?Patient Account Number: [id number] ?Date of Birth/Sex: 1918/12/04 (89 y.o. M) ?Treating RN: Levora Dredge ?Primary Care Provider: Jenna Luo Other Clinician: ?Referring Provider: Jenna Luo ?Treating Provider/Extender: Jeri Cos ?Weeks in Treatment: 3 ?History of Present Illness ?Location: left and right dorsum foot and in between his toes ?Quality: Patient reports No Pain. ?Severity: Patient states wound are getting worse. ?Duration: Patient has had the wound for < 2 weeks prior to presenting for treatment ?Timing: Pain in wound is Intermittent (comes and goes ?Context: The wound appeared gradually over time ?Modifying Factors: Other treatment(s) tried include:admitted to the hospital and had a arterial workup and has been on oral antibiotics ?Associated Signs and Symptoms: Patient reports having increase discharge. ?Jimmy Description: 88 year old gentleman comes [facility] with a history of having recently been admitted to the hospital ?between February 20 and 01/20/2017, and he was admitted with cellulitis and peripheral vascular disease with possible status dermatitis and an ?open toe wound. he is got a medical history significant of peripheral vascular disease, hypertension, non-Hodgkin's lymphoma in remission and ?amputation of the left great toe. status post amputation of the left big toe in July 2016 and biopsy of the left neck mass in October 2014. he does ?not smoke at present and given In the 80s. I understand in the past he's had a venous duplex study done and was treated for symptomatic ?varicose veins with compression stockings. ?Patient was treated in hospital with Rocephin and Flagyl and was discharged home on Keflex and Diflucan for 7 days. ?He  had a ABI which showed reduced light flow in the left lower extremity and vascular surgery recommended an outpatient angiogram. right ABI ?was 0.94 with a monophasic flow in the left ABI was 0.67 with a monophasic flow. was seen b

## 2022-04-05 NOTE — Progress Notes (Signed)
KOTY, ANCTIL T. ([id number]) ?Visit Report for 04/05/2022 ?Arrival Information Details ?Patient Name: [name] ?Date of Service: 04/05/2022 2:15 PM ?Medical Record Number: [id number] ?Patient Account Number: [id number] ?Date of Birth/Sex: 10/31/1919 (88 y.o. M) ?Treating RN: Levora Dredge ?Primary Care Daenerys Buttram: Jenna Luo Other Clinician: ?Referring Anis Degidio: Jenna Luo ?Treating Haizel Gatchell/Extender: Jeri Cos ?Weeks in Treatment: 3 ?Visit Information History Since Last Visit ?Added or deleted any medications: No ?Patient Arrived: Wheel Chair ?Any new allergies or adverse reactions: No ?Arrival Time: 14:19 ?Had a fall or experienced change in No ?Accompanied By: daughter ?activities of daily living that may affect ?Transfer Assistance: Stretcher ?risk of falls: ?Patient Identification Verified: Yes ?Hospitalized since last visit: No ?Secondary Verification Process Completed: Yes ?Has Dressing in Place as Prescribed: Yes ?Patient Requires Transmission-Based No ?Pain Present Now: No ?Precautions: ?Patient Has Alerts: Yes ?Patient Alerts: Patient on Blood ?Thinner ?12/31/21 ABI L 0.79 R ?0.76 ?not Diabetic ?Electronic Signature(s) ?Signed: 04/05/2022 4:57:35 PM By: Levora Dredge ?Entered By: Levora Dredge on 04/05/2022 14:19:43 ?[place], [place] T. ([id number]) ?-------------------------------------------------------------------------------- ?Clinic Level of Care Assessment Details ?Patient Name: [name] ?Date of Service: 04/05/2022 2:15 PM ?Medical Record Number: [id number] ?Patient Account Number: [id number] ?Date of Birth/Sex: 03-09-1919 (88 y.o. M) ?Treating RN: Levora Dredge ?Primary Care Ellamae Lybeck: Jenna Luo Other Clinician: ?Referring Marcus Schwandt: Jenna Luo ?Treating Karuna Balducci/Extender: Jeri Cos ?Weeks in Treatment: 3 ?Clinic Level of Care Assessment Items ?TOOL 1 Quantity Score ?'[]'$  - Use when EandM and Procedure is performed on INITIAL visit 0 ?ASSESSMENTS - Nursing Assessment /  Reassessment ?'[]'$  - General Physical Exam (combine w/ comprehensive assessment (listed just below) when performed on new ?0 ?pt. evals) ?'[]'$  - 0 ?Comprehensive Assessment (HX, ROS, Risk Assessments, Wounds Hx, etc.) ?ASSESSMENTS - Wound and Skin Assessment / Reassessment ?'[]'$  - Dermatologic / Skin Assessment (not related to wound area) 0 ?ASSESSMENTS - Ostomy and/or Continence Assessment and Care ?'[]'$  - Incontinence Assessment and Management 0 ?'[]'$  - 0 ?Ostomy Care Assessment and Management (repouching, etc.) ?PROCESS - Coordination of Care ?'[]'$  - Simple Patient / Family Education for ongoing care 0 ?'[]'$  - 0 ?Complex (extensive) Patient / Family Education for ongoing care ?'[]'$  - 0 ?Staff obtains Consents, Records, Test Results / Process Orders ?'[]'$  - 0 ?Staff telephones HHA, Nursing Homes / Clarify orders / etc ?'[]'$  - 0 ?Routine Transfer to another Facility (non-emergent condition) ?'[]'$  - 0 ?Routine Hospital Admission (non-emergent condition) ?'[]'$  - 0 ?New Admissions / Biomedical engineer / Ordering NPWT, Apligraf, etc. ?'[]'$  - 0 ?Emergency Hospital Admission (emergent condition) ?PROCESS - Special Needs ?'[]'$  - Pediatric / Minor Patient Management 0 ?'[]'$  - 0 ?Isolation Patient Management ?'[]'$  - 0 ?Hearing / Language / Visual special needs ?'[]'$  - 0 ?Assessment of Community assistance (transportation, D/C planning, etc.) ?'[]'$  - 0 ?Additional assistance / Altered mentation ?'[]'$  - 0 ?Support Surface(s) Assessment (bed, cushion, seat, etc.) ?INTERVENTIONS - Miscellaneous ?'[]'$  - External ear exam 0 ?'[]'$  - 0 ?Patient Transfer (multiple staff / Civil Service fast streamer / Similar devices) ?'[]'$  - 0 ?Simple Staple / Suture removal (25 or less) ?'[]'$  - 0 ?Complex Staple / Suture removal (26 or more) ?'[]'$  - 0 ?Hypo/Hyperglycemic Management (do not check if billed separately) ?'[]'$  - 0 ?Ankle / Brachial Index (ABI) - do not check if billed separately ?Has the patient been seen at the hospital within the last three years: Yes ?Total Score: 0 ?Level Of Care:  ____ ?LEEANDRE, NORDLING T. ([id number]) ?Electronic Signature(s) ?Signed: 04/05/2022 4:57:35 PM By: Levora Dredge ?Entered By: Levora Dredge on 04/05/2022  16:52:27 ?[place], Zac T. ([id number]) ?-------------------------------------------------------------------------------- ?Encounter Discharge Information Details ?Patient Name: [name] ?Date of Service: 04/05/2022 2:15 PM ?Medical Record Number: [id number] ?Patient Account Number: [id number] ?Date of Birth/Sex: 1919/07/26 (88 y.o. M) ?Treating RN: Levora Dredge ?Primary Care Loucinda Croy: Jenna Luo Other Clinician: ?Referring Jannett Schmall: Jenna Luo ?Treating Donnia Poplaski/Extender: Jeri Cos ?Weeks in Treatment: 3 ?Encounter Discharge Information Items Post Procedure Vitals ?Discharge Condition: Stable ?Temperature (?F): 97.5 ?Ambulatory Status: Wheelchair ?Pulse (bpm): 78 ?Discharge Destination: Home ?Respiratory Rate (breaths/min): 18 ?Transportation: Private Auto ?Blood Pressure (mmHg): 129/75 ?Accompanied By: daughter ?Schedule Follow-up Appointment: Yes ?Clinical Summary of Care: Patient Declined ?Electronic Signature(s) ?Signed: 04/05/2022 4:53:52 PM By: Levora Dredge ?Entered By: Levora Dredge on 04/05/2022 16:53:52 ?[place], Krew T. ([id number]) ?-------------------------------------------------------------------------------- ?Lower Extremity Assessment Details ?Patient Name: [name] ?Date of Service: 04/05/2022 2:15 PM ?Medical Record Number: [id number] ?Patient Account Number: [id number] ?Date of Birth/Sex: August 09, 1919 (88 y.o. M) ?Treating RN: Levora Dredge ?Primary Care Katharine Rochefort: Jenna Luo Other Clinician: ?Referring Staphanie Harbison: Jenna Luo ?Treating Baily Hovanec/Extender: Jeri Cos ?Weeks in Treatment: 3 ?Edema Assessment ?Assessed: [Left: No] [Right: No] ?Edema: [Left: Ye] [Right: s] ?Calf ?Left: Right: ?Point of Measurement: 35 cm From Medial Instep 39 cm ?Ankle ?Left: Right: ?Point of Measurement: 12 cm From Medial Instep 27 cm ?Vascular  Assessment ?Pulses: ?Dorsalis Pedis ?Palpable: [Right:Yes] ?Notes ?pedal pulse slight ?Electronic Signature(s) ?Signed: 04/05/2022 4:57:35 PM By: Levora Dredge ?Entered By: Levora Dredge on 04/05/2022 14:30:35 ?[place], Matej T. ([id number]) ?-------------------------------------------------------------------------------- ?Multi Wound Chart Details ?Patient Name: [name] ?Date of Service: 04/05/2022 2:15 PM ?Medical Record Number: [id number] ?Patient Account Number: [id number] ?Date of Birth/Sex: Apr 21, 1919 (88 y.o. M) ?Treating RN: Levora Dredge ?Primary Care Sharon Stapel: Jenna Luo Other Clinician: ?Referring Kurstin Dimarzo: Jenna Luo ?Treating Khiem Gargis/Extender: Jeri Cos ?Weeks in Treatment: 3 ?Vital Signs ?Height(in): 73 ?Pulse(bpm): 64 ?Weight(lbs): 200 ?Blood Pressure(mmHg): 125/74 ?Body Mass Index(BMI): 26.4 ?Temperature(??F): 97.6 ?Respiratory Rate(breaths/min): 18 ?Photos: [N/A:N/A] ?Wound Location: Right, Lateral Foot Right, Medial Ankle N/A ?Wounding Event: Gradually Appeared Gradually Appeared N/A ?Primary Etiology: Venous Leg Ulcer Venous Leg Ulcer N/A ?Comorbid History: Cataracts, Hypertension, Peripheral Cataracts, Hypertension, Peripheral N/A ?Venous Disease, Received Venous Disease, Received ?Chemotherapy, Received Radiation Chemotherapy, Received Radiation ?Date Acquired: 11/28/2021 03/01/2022 N/A ?Weeks of Treatment: 3 3 N/A ?Wound Status: Open Open N/A ?Wound Recurrence: No No N/A ?Measurements L x W x D (cm) 2x5.7x0.6 0.8x0.9x0.3 N/A ?Area (cm?) : 8.954 0.565 N/A ?Volume (cm?) : 5.372 0.17 N/A ?% Reduction in Area: -29.50% 28.00% N/A ?% Reduction in Volume: -159.10% -8.30% N/A ?Classification: Full Thickness With Exposed Full Thickness Without Exposed N/A ?Support Structures Support Structures ?Exudate Amount: Medium Medium N/A ?Exudate Type: Serosanguineous Serosanguineous N/A ?Exudate Color: red, brown red, brown N/A ?Granulation Amount: Medium (34-66%) Medium (34-66%) N/A ?Granulation  Quality: Red, Pink Red N/A ?Necrotic Amount: Medium (34-66%) Medium (34-66%) N/A ?Exposed Structures: ?Fat Layer (Subcutaneous Tissue): ?Fat Layer (Subcutaneous Tissue): N/A ?Yes Yes ?Tendon: Yes ?Epithelialization: None

## 2022-04-12 ENCOUNTER — Other Ambulatory Visit: Payer: Self-pay

## 2022-04-12 ENCOUNTER — Emergency Department: Payer: Medicare Other

## 2022-04-12 ENCOUNTER — Inpatient Hospital Stay
Admission: EM | Admit: 2022-04-12 | Discharge: 2022-04-21 | DRG: 177 | Disposition: A | Discharge status: Skilled Nursing Facility | Payer: Medicare Other | Attending: Hospitalist | Admitting: Hospitalist

## 2022-04-12 ENCOUNTER — Encounter: Payer: Medicare Other | Admitting: Physician Assistant

## 2022-04-12 ENCOUNTER — Telehealth: Payer: Self-pay

## 2022-04-12 DIAGNOSIS — J690 Pneumonitis due to inhalation of food and vomit: Secondary | ICD-10-CM | POA: Diagnosis not present

## 2022-04-12 DIAGNOSIS — G9341 Metabolic encephalopathy: Secondary | ICD-10-CM | POA: Diagnosis not present

## 2022-04-12 DIAGNOSIS — R0989 Other specified symptoms and signs involving the circulatory and respiratory systems: Secondary | ICD-10-CM | POA: Diagnosis present

## 2022-04-12 DIAGNOSIS — R4182 Altered mental status, unspecified: Secondary | ICD-10-CM | POA: Diagnosis present

## 2022-04-12 DIAGNOSIS — Z950 Presence of cardiac pacemaker: Secondary | ICD-10-CM

## 2022-04-12 DIAGNOSIS — M869 Osteomyelitis, unspecified: Secondary | ICD-10-CM | POA: Diagnosis present

## 2022-04-12 DIAGNOSIS — I7389 Other specified peripheral vascular diseases: Secondary | ICD-10-CM | POA: Diagnosis not present

## 2022-04-12 DIAGNOSIS — R630 Anorexia: Secondary | ICD-10-CM | POA: Diagnosis present

## 2022-04-12 DIAGNOSIS — Z7901 Long term (current) use of anticoagulants: Secondary | ICD-10-CM | POA: Diagnosis not present

## 2022-04-12 DIAGNOSIS — R0602 Shortness of breath: Secondary | ICD-10-CM | POA: Diagnosis not present

## 2022-04-12 DIAGNOSIS — R278 Other lack of coordination: Secondary | ICD-10-CM | POA: Diagnosis not present

## 2022-04-12 DIAGNOSIS — L97519 Non-pressure chronic ulcer of other part of right foot with unspecified severity: Secondary | ICD-10-CM | POA: Diagnosis not present

## 2022-04-12 DIAGNOSIS — Z7189 Other specified counseling: Secondary | ICD-10-CM | POA: Diagnosis not present

## 2022-04-12 DIAGNOSIS — R338 Other retention of urine: Secondary | ICD-10-CM | POA: Diagnosis present

## 2022-04-12 DIAGNOSIS — M86171 Other acute osteomyelitis, right ankle and foot: Secondary | ICD-10-CM | POA: Diagnosis not present

## 2022-04-12 DIAGNOSIS — N170 Acute kidney failure with tubular necrosis: Secondary | ICD-10-CM | POA: Diagnosis not present

## 2022-04-12 DIAGNOSIS — M86671 Other chronic osteomyelitis, right ankle and foot: Secondary | ICD-10-CM | POA: Diagnosis not present

## 2022-04-12 DIAGNOSIS — Z8249 Family history of ischemic heart disease and other diseases of the circulatory system: Secondary | ICD-10-CM

## 2022-04-12 DIAGNOSIS — Z79890 Hormone replacement therapy (postmenopausal): Secondary | ICD-10-CM

## 2022-04-12 DIAGNOSIS — R54 Age-related physical debility: Secondary | ICD-10-CM | POA: Diagnosis present

## 2022-04-12 DIAGNOSIS — Z9049 Acquired absence of other specified parts of digestive tract: Secondary | ICD-10-CM

## 2022-04-12 DIAGNOSIS — I4819 Other persistent atrial fibrillation: Secondary | ICD-10-CM | POA: Diagnosis present

## 2022-04-12 DIAGNOSIS — N184 Chronic kidney disease, stage 4 (severe): Secondary | ICD-10-CM | POA: Diagnosis present

## 2022-04-12 DIAGNOSIS — R404 Transient alteration of awareness: Secondary | ICD-10-CM | POA: Diagnosis not present

## 2022-04-12 DIAGNOSIS — Z66 Do not resuscitate: Secondary | ICD-10-CM | POA: Diagnosis not present

## 2022-04-12 DIAGNOSIS — E039 Hypothyroidism, unspecified: Secondary | ICD-10-CM | POA: Diagnosis not present

## 2022-04-12 DIAGNOSIS — R0600 Dyspnea, unspecified: Secondary | ICD-10-CM | POA: Diagnosis not present

## 2022-04-12 DIAGNOSIS — J9601 Acute respiratory failure with hypoxia: Secondary | ICD-10-CM

## 2022-04-12 DIAGNOSIS — R29898 Other symptoms and signs involving the musculoskeletal system: Secondary | ICD-10-CM | POA: Diagnosis not present

## 2022-04-12 DIAGNOSIS — I739 Peripheral vascular disease, unspecified: Secondary | ICD-10-CM | POA: Diagnosis present

## 2022-04-12 DIAGNOSIS — I129 Hypertensive chronic kidney disease with stage 1 through stage 4 chronic kidney disease, or unspecified chronic kidney disease: Secondary | ICD-10-CM | POA: Diagnosis not present

## 2022-04-12 DIAGNOSIS — Z79899 Other long term (current) drug therapy: Secondary | ICD-10-CM | POA: Diagnosis not present

## 2022-04-12 DIAGNOSIS — Z7982 Long term (current) use of aspirin: Secondary | ICD-10-CM | POA: Diagnosis not present

## 2022-04-12 DIAGNOSIS — I10 Essential (primary) hypertension: Secondary | ICD-10-CM | POA: Diagnosis not present

## 2022-04-12 DIAGNOSIS — N139 Obstructive and reflux uropathy, unspecified: Secondary | ICD-10-CM | POA: Diagnosis not present

## 2022-04-12 DIAGNOSIS — J698 Pneumonitis due to inhalation of other solids and liquids: Secondary | ICD-10-CM | POA: Diagnosis not present

## 2022-04-12 DIAGNOSIS — Z801 Family history of malignant neoplasm of trachea, bronchus and lung: Secondary | ICD-10-CM | POA: Diagnosis not present

## 2022-04-12 DIAGNOSIS — K219 Gastro-esophageal reflux disease without esophagitis: Secondary | ICD-10-CM | POA: Diagnosis not present

## 2022-04-12 DIAGNOSIS — Z7401 Bed confinement status: Secondary | ICD-10-CM | POA: Diagnosis not present

## 2022-04-12 DIAGNOSIS — L97312 Non-pressure chronic ulcer of right ankle with fat layer exposed: Secondary | ICD-10-CM | POA: Diagnosis not present

## 2022-04-12 DIAGNOSIS — Z87891 Personal history of nicotine dependence: Secondary | ICD-10-CM

## 2022-04-12 DIAGNOSIS — Z72820 Sleep deprivation: Secondary | ICD-10-CM

## 2022-04-12 DIAGNOSIS — I5022 Chronic systolic (congestive) heart failure: Secondary | ICD-10-CM | POA: Diagnosis not present

## 2022-04-12 DIAGNOSIS — R293 Abnormal posture: Secondary | ICD-10-CM | POA: Diagnosis not present

## 2022-04-12 DIAGNOSIS — N179 Acute kidney failure, unspecified: Secondary | ICD-10-CM | POA: Diagnosis present

## 2022-04-12 DIAGNOSIS — N1832 Chronic kidney disease, stage 3b: Secondary | ICD-10-CM

## 2022-04-12 DIAGNOSIS — Z823 Family history of stroke: Secondary | ICD-10-CM | POA: Diagnosis not present

## 2022-04-12 DIAGNOSIS — J811 Chronic pulmonary edema: Secondary | ICD-10-CM | POA: Diagnosis not present

## 2022-04-12 DIAGNOSIS — N401 Enlarged prostate with lower urinary tract symptoms: Secondary | ICD-10-CM | POA: Diagnosis present

## 2022-04-12 DIAGNOSIS — Z741 Need for assistance with personal care: Secondary | ICD-10-CM | POA: Diagnosis not present

## 2022-04-12 DIAGNOSIS — R262 Difficulty in walking, not elsewhere classified: Secondary | ICD-10-CM | POA: Diagnosis not present

## 2022-04-12 DIAGNOSIS — K5900 Constipation, unspecified: Secondary | ICD-10-CM | POA: Diagnosis present

## 2022-04-12 DIAGNOSIS — Z8572 Personal history of non-Hodgkin lymphomas: Secondary | ICD-10-CM

## 2022-04-12 DIAGNOSIS — L97512 Non-pressure chronic ulcer of other part of right foot with fat layer exposed: Secondary | ICD-10-CM | POA: Diagnosis not present

## 2022-04-12 DIAGNOSIS — Z743 Need for continuous supervision: Secondary | ICD-10-CM | POA: Diagnosis not present

## 2022-04-12 DIAGNOSIS — M6281 Muscle weakness (generalized): Secondary | ICD-10-CM | POA: Diagnosis not present

## 2022-04-12 DIAGNOSIS — E872 Acidosis, unspecified: Secondary | ICD-10-CM | POA: Diagnosis not present

## 2022-04-12 DIAGNOSIS — R410 Disorientation, unspecified: Secondary | ICD-10-CM | POA: Diagnosis not present

## 2022-04-12 DIAGNOSIS — I4891 Unspecified atrial fibrillation: Secondary | ICD-10-CM | POA: Diagnosis not present

## 2022-04-12 DIAGNOSIS — R1030 Lower abdominal pain, unspecified: Secondary | ICD-10-CM | POA: Diagnosis not present

## 2022-04-12 DIAGNOSIS — R531 Weakness: Secondary | ICD-10-CM | POA: Diagnosis not present

## 2022-04-12 DIAGNOSIS — R2681 Unsteadiness on feet: Secondary | ICD-10-CM | POA: Diagnosis not present

## 2022-04-12 DIAGNOSIS — R339 Retention of urine, unspecified: Secondary | ICD-10-CM | POA: Diagnosis not present

## 2022-04-12 DIAGNOSIS — I872 Venous insufficiency (chronic) (peripheral): Secondary | ICD-10-CM | POA: Diagnosis not present

## 2022-04-12 DIAGNOSIS — M7989 Other specified soft tissue disorders: Secondary | ICD-10-CM | POA: Diagnosis not present

## 2022-04-12 LAB — CBC
HCT: 42.8 % (ref 39.0–52.0)
Hemoglobin: 13.4 g/dL (ref 13.0–17.0)
MCH: 28.8 pg (ref 26.0–34.0)
MCHC: 31.3 g/dL (ref 30.0–36.0)
MCV: 92 fL (ref 80.0–100.0)
Platelets: 163 10*3/uL (ref 150–400)
RBC: 4.65 MIL/uL (ref 4.22–5.81)
RDW: 15.3 % (ref 11.5–15.5)
WBC: 5.9 10*3/uL (ref 4.0–10.5)
nRBC: 0 % (ref 0.0–0.2)

## 2022-04-12 LAB — BRAIN NATRIURETIC PEPTIDE: B Natriuretic Peptide: 159.2 pg/mL — ABNORMAL HIGH (ref 0.0–100.0)

## 2022-04-12 LAB — URINALYSIS, COMPLETE (UACMP) WITH MICROSCOPIC
Bacteria, UA: NONE SEEN
RBC / HPF: >50 RBC/hpf — ABNORMAL HIGH (ref 0–5)
Specific Gravity, Urine: 1.021 (ref 1.005–1.030)
Squamous Epithelial / HPF: NONE SEEN (ref 0–5)
WBC, UA: >50 WBC/hpf — ABNORMAL HIGH (ref 0–5)

## 2022-04-12 LAB — COMPREHENSIVE METABOLIC PANEL
ALT: 11 U/L (ref 0–44)
AST: 23 U/L (ref 15–41)
Albumin: 4.6 g/dL (ref 3.5–5.0)
Alkaline Phosphatase: 70 U/L (ref 38–126)
Anion gap: 9 (ref 5–15)
BUN: 33 mg/dL — ABNORMAL HIGH (ref 8–23)
CO2: 24 mmol/L (ref 22–32)
Calcium: 9.7 mg/dL (ref 8.9–10.3)
Chloride: 103 mmol/L (ref 98–111)
Creatinine, Ser: 2.24 mg/dL — ABNORMAL HIGH (ref 0.61–1.24)
GFR, Estimated: 26 mL/min — ABNORMAL LOW (ref >60)
Glucose, Bld: 121 mg/dL — ABNORMAL HIGH (ref 70–99)
Potassium: 4.9 mmol/L (ref 3.5–5.1)
Sodium: 136 mmol/L (ref 135–145)
Total Bilirubin: 0.4 mg/dL (ref 0.3–1.2)
Total Protein: 8.5 g/dL — ABNORMAL HIGH (ref 6.5–8.1)

## 2022-04-12 LAB — PROCALCITONIN: Procalcitonin: <0.1 ng/mL

## 2022-04-12 LAB — PREALBUMIN: Prealbumin: 23.3 mg/dL (ref 18–38)

## 2022-04-12 LAB — C-REACTIVE PROTEIN: CRP: 0.7 mg/dL (ref <1.0)

## 2022-04-12 LAB — SEDIMENTATION RATE: Sed Rate: 13 mm/hr (ref 0–20)

## 2022-04-12 MED ORDER — LEVOTHYROXINE SODIUM 112 MCG PO TABS
112.0000 ug | ORAL_TABLET | Freq: Every day | ORAL | Status: DC
  Administered 2022-04-13 – 2022-04-21 (×8): 112 ug via ORAL
  Filled 2022-04-12 (×10): qty 1

## 2022-04-12 MED ORDER — HYDRALAZINE HCL 20 MG/ML IJ SOLN
20.0000 mg | Freq: Four times a day (QID) | INTRAMUSCULAR | Status: DC | PRN
Start: 2022-04-12 — End: 2022-04-21

## 2022-04-12 MED ORDER — ASPIRIN EC 81 MG PO TBEC
81.0000 mg | DELAYED_RELEASE_TABLET | Freq: Every day | ORAL | Status: DC
Start: 2022-04-12 — End: 2022-04-21
  Administered 2022-04-12 – 2022-04-21 (×10): 81 mg via ORAL
  Filled 2022-04-12 (×10): qty 1

## 2022-04-12 MED ORDER — SODIUM CHLORIDE 0.9 % IV BOLUS
1000.0000 mL | Freq: Once | INTRAVENOUS | Status: AC
Start: 2022-04-12 — End: 2022-04-12
  Administered 2022-04-12: 1000 mL via INTRAVENOUS

## 2022-04-12 MED ORDER — AMLODIPINE BESYLATE 5 MG PO TABS
5.0000 mg | ORAL_TABLET | Freq: Every day | ORAL | Status: DC
  Administered 2022-04-12 – 2022-04-18 (×7): 5 mg via ORAL
  Filled 2022-04-12 (×7): qty 1

## 2022-04-12 MED ORDER — HEPARIN SODIUM (PORCINE) 5000 UNIT/ML IJ SOLN
5000.0000 [IU] | Freq: Three times a day (TID) | INTRAMUSCULAR | Status: DC
  Administered 2022-04-12 – 2022-04-21 (×25): 5000 [IU] via SUBCUTANEOUS
  Filled 2022-04-12 (×25): qty 1

## 2022-04-12 MED ORDER — VANCOMYCIN HCL 1500 MG/300ML IV SOLN
1500.0000 mg | Freq: Once | INTRAVENOUS | Status: DC
  Filled 2022-04-12 (×2): qty 300

## 2022-04-12 MED ORDER — SODIUM CHLORIDE 0.9% FLUSH
3.0000 mL | Freq: Two times a day (BID) | INTRAVENOUS | Status: DC
  Administered 2022-04-12 – 2022-04-21 (×16): 3 mL via INTRAVENOUS

## 2022-04-12 MED ORDER — VANCOMYCIN HCL 1500 MG/300ML IV SOLN
1500.0000 mg | Freq: Once | INTRAVENOUS | Status: AC
  Administered 2022-04-12: 1500 mg via INTRAVENOUS
  Filled 2022-04-12: qty 300

## 2022-04-12 MED ORDER — LACTATED RINGERS IV SOLN: INTRAVENOUS | Status: AC

## 2022-04-12 MED ORDER — PIPERACILLIN-TAZOBACTAM 3.375 G IVPB 30 MIN
3.3750 g | Freq: Once | INTRAVENOUS | Status: AC
  Administered 2022-04-12: 3.375 g via INTRAVENOUS
  Filled 2022-04-12: qty 50

## 2022-04-12 NOTE — ED Triage Notes (Signed)
Pt comes with c/o AMS via EMs from wound care clinic. Pt was sent over by provider bc he coulndn't remember his daughters name. ? ?Daughters with pt at this time. And state increased weakness, not sleeping, AMS and hallucinations. ? ?Pt does state he is in the operating room.  ?

## 2022-04-12 NOTE — ED Notes (Signed)
Dr. Patel at bedside 

## 2022-04-12 NOTE — H&P (Addendum)
?History and Physical  ? ? ?Patient: [name] TLX:[id number] DOB: 05/18/1919 ?DOA: 04/12/2022 ?DOS: the patient was seen and examined on 04/13/2022 ?PCP: Susy Frizzle, MD  ?Patient coming from: Home ? ?Chief Complaint:  ?Chief Complaint  ?Patient presents with  ? Altered Mental Status  ? ?HPI: [name] is a 88 y.o. male with medical history significant  of Htn and pacemaker for CHB and atrial fibrillation not a candidate for [facility]. ?Pt sees cardiology an last visit of 4/6 [name].  ?Pt has a wound MD Dr.Pickard and was seen  By provider and started on bactrim.  ? ?Daughter at bedside and is describing his confusion and hallucination.  ? ?Review of Systems  ?Unable to perform ROS: Age  ? ?Past Medical History:  ?Diagnosis Date  ? Acid reflux   ? Cancer ([place]) 09/05/13  ? left neck-non-hodgkins lymphoma  ? Hypertension   ? requires diuretic  ? Hypothyroidism   ? Malaria   ? while in the service  ? Peripheral vascular disease ([place])   ? slow wound healing on legs  ? ?Past Surgical History:  ?Procedure Laterality Date  ? AMPUTATION Left 06/25/2015  ? Procedure: LEFT  HALLUX AMPUTATION,;  Surgeon: Wylene Simmer, MD;  Location: [facility];  Service: Orthopedics;  Laterality: Left;  LOCAL/MAC  ? CHOLECYSTECTOMY N/A 11/08/2018  ? Procedure: LAPAROSCOPIC CHOLECYSTECTOMY WITH INTRAOPERATIVE CHOLANGIOGRAM;  Surgeon: [name], MD;  Location: [facility];  Service: General;  Laterality: N/A;  ? MASS BIOPSY Left 09/05/2013  ? Procedure: EXCISIONAL BIOPSY OF LEFT NECK MASS;  Surgeon: Jerrell Belfast, MD;  Location: [facility];  Service: ENT;  Laterality: Left;  ? PACEMAKER IMPLANT N/A 05/28/2020  ? Procedure: PACEMAKER IMPLANT;  Surgeon: Thompson Grayer, MD;  Location: [facility];  Service: Cardiovascular;  Laterality: N/A;  ? TONSILLECTOMY    ? YAG LASER APPLICATION Right 7/41/6384  ? Procedure: YAG LASER APPLICATION;  Surgeon: [name], MD;  Location: [facility];  Service: Ophthalmology;  Laterality: Right;   ? ?Social History:  reports that he has quit smoking. His smoking use included cigars. He quit smokeless tobacco use about 42 years ago. He reports that he does not drink alcohol and does not use drugs. ? ?Allergies  ?Allergen Reactions  ? Codeine Nausea And Vomiting  ? Percocet [Oxycodone-Acetaminophen] Nausea And Vomiting  ? ? ?Family History  ?Problem Relation Age of Onset  ? Lung cancer Brother   ? Heart disease Brother   ? Stroke Brother   ? ? ?Prior to Admission medications   ?Medication Sig Start Date End Date Taking? Authorizing Provider  ?aspirin EC 81 MG tablet Take 81 mg by mouth daily. Swallow whole.   Yes [provider]  ?betamethasone dipropionate 0.05 % cream Apply topically 2 (two) times daily. Apply small amount to red areas on legs twice daily. 12/04/20  Yes Eulogio Bear, NP  ?furosemide (LASIX) 20 MG tablet TAKE 2 TABLETS BY MOUTH  DAILY 10/07/20  Yes Susy Frizzle, MD  ?levothyroxine (SYNTHROID) 112 MCG tablet TAKE 1 TABLET BY MOUTH  DAILY 06/15/21  Yes Susy Frizzle, MD  ?mometasone (ELOCON) 0.1 % cream USE AS DIRECTED 12/10/21  Yes Susy Frizzle, MD  ?pregabalin (LYRICA) 25 MG capsule TAKE 1 CAPSULE BY MOUTH IN  THE MORNING AND 2 CAPSULES  IN THE EVENING 12/24/21  Yes Susy Frizzle, MD  ?silver sulfADIAZINE (SILVADENE) 1 % cream Apply 1 application topically daily. 01/17/22  Yes Pickard,  Cammie Mcgee, MD  ?sulfamethoxazole-trimethoprim (BACTRIM) 400-80 MG tablet Take 1 tablet by mouth 2 (two) times daily.   Yes [provider]  ?triamcinolone (KENALOG) 0.1 % Apply 1 application topically 3 (three) times daily. 02/26/21  Yes Susy Frizzle, MD  ?vitamin B-12 (CYANOCOBALAMIN) 1000 MCG tablet Take 1 tablet (1,000 mcg total) by mouth daily. 05/01/15  Yes Susy Frizzle, MD  ?amLODipine (NORVASC) 5 MG tablet TAKE 1 TABLET BY MOUTH  DAILY ?Patient not taking: Reported on 03/03/2022 06/15/21   Susy Frizzle, MD  ?cephALEXin (KEFLEX) 500 MG capsule Take 1 capsule  (500 mg total) by mouth 3 (three) times daily. ?Patient not taking: Reported on 03/03/2022 02/24/22   Susy Frizzle, MD  ?diphenhydramine-acetaminophen (TYLENOL PM) 25-500 MG TABS tablet Take 1 tablet by mouth at bedtime as needed.    [provider]  ?enalapril (VASOTEC) 20 MG tablet TAKE 1 TABLET BY MOUTH  DAILY ?Patient not taking: Reported on 03/03/2022 08/31/21   Susy Frizzle, MD  ?escitalopram (LEXAPRO) 10 MG tablet TAKE 1 TABLET BY MOUTH EVERY DAY ?Patient not taking: Reported on 03/03/2022 06/11/20   Annie Main, FNP  ?NONFORMULARY OR COMPOUNDED ITEM See pharmacy note ?Patient taking differently: as needed. Compound Mixture for feet 07/29/20   Felipa Furnace, DPM  ?Rivaroxaban (XARELTO) 15 MG TABS tablet Take 1 tablet (15 mg total) by mouth daily with supper. ?Patient not taking: Reported on 03/03/2022 07/07/21   Shirley Friar, PA-C  ? ? ?Physical Exam: ?Vitals:  ? 04/12/22 2300 04/12/22 2315 04/12/22 2330 04/12/22 2345  ?BP: (!) 145/63  (!) 119/54   ?Pulse: 64 63 63 61  ?Resp: _0 ?Temp:      ?TempSrc:      ?SpO2: 99% 100% 98% 94%  ?Weight:      ? ?Physical Exam ?Vitals and nursing note reviewed.  ?Constitutional:   ?   General: He is not in acute distress. ?   Appearance: Normal appearance. He is not ill-appearing, toxic-appearing or diaphoretic.  ?HENT:  ?   Head: Normocephalic and atraumatic.  ?   Right Ear: Hearing and external ear normal.  ?   Left Ear: Hearing and external ear normal.  ?   Nose: Nose normal. No nasal deformity.  ?   Mouth/Throat:  ?   Lips: Pink.  ?   Mouth: Mucous membranes are moist.  ?   Tongue: No lesions.  ?   Pharynx: Oropharynx is clear.  ?Eyes:  ?   Extraocular Movements: Extraocular movements intact.  ?   Pupils: Pupils are equal, round, and reactive to light.  ?Cardiovascular:  ?   Rate and Rhythm: Normal rate and regular rhythm.  ?   Pulses:     ?     Dorsalis pedis pulses are 1+ on the right side and 1+ on the left side.  ?     Posterior tibial  pulses are 1+ on the right side and 1+ on the left side.  ?   Heart sounds: Normal heart sounds.  ?Pulmonary:  ?   Effort: Pulmonary effort is normal.  ?   Breath sounds: Normal breath sounds.  ?Abdominal:  ?   General: Bowel sounds are normal. There is no distension.  ?   Palpations: Abdomen is soft. There is no mass.  ?   Tenderness: There is no abdominal tenderness. There is no guarding.  ?   Hernia: No hernia is present.  ?Musculoskeletal:  ?  Right lower leg: 1+ Edema present.  ?   Left lower leg: 1+ Edema present.  ?Skin: ?   General: Skin is warm.  ?Neurological:  ?   General: No focal deficit present.  ?   Mental Status: He is alert.  ?   Cranial Nerves: Cranial nerves 2-12 are intact.  ?   Motor: Motor function is intact.  ?Psychiatric:     ?   Attention and Perception: Attention normal.     ?   Mood and Affect: Mood normal.     ?   Speech: Speech normal.     ?   Behavior: Behavior normal. Behavior is cooperative.     ?   Cognition and Memory: Cognition normal.  ? ? ?Data Reviewed: ?Results for orders placed or performed during the hospital encounter of 04/12/22 (from the past 24 hour(s))  ?Comprehensive metabolic panel     Status: Abnormal  ? Collection Time: 04/12/22  1:34 PM  ?Result Value Ref Range  ? Sodium 136 135 - 145 mmol/L  ? Potassium 4.9 3.5 - 5.1 mmol/L  ? Chloride 103 98 - 111 mmol/L  ? CO2 24 22 - 32 mmol/L  ? Glucose, Bld 121 (H) 70 - 99 mg/dL  ? BUN 33 (H) 8 - 23 mg/dL  ? Creatinine, Ser 2.24 (H) 0.61 - 1.24 mg/dL  ? Calcium 9.7 8.9 - 10.3 mg/dL  ? Total Protein 8.5 (H) 6.5 - 8.1 g/dL  ? Albumin 4.6 3.5 - 5.0 g/dL  ? AST 23 15 - 41 U/L  ? ALT 11 0 - 44 U/L  ? Alkaline Phosphatase 70 38 - 126 U/L  ? Total Bilirubin 0.4 0.3 - 1.2 mg/dL  ? GFR, Estimated 26 (L) >60 mL/min  ? Anion gap 9 5 - 15  ?CBC     Status: None  ? Collection Time: 04/12/22  1:34 PM  ?Result Value Ref Range  ? WBC 5.9 4.0 - 10.5 K/uL  ? RBC 4.65 4.22 - 5.81 MIL/uL  ? Hemoglobin 13.4 13.0 - 17.0 g/dL  ? HCT 42.8 39.0 -  52.0 %  ? MCV 92.0 80.0 - 100.0 fL  ? MCH 28.8 26.0 - 34.0 pg  ? MCHC 31.3 30.0 - 36.0 g/dL  ? RDW 15.3 11.5 - 15.5 %  ? Platelets 163 150 - 400 K/uL  ? nRBC 0.0 0.0 - 0.2 %  ?Sedimentation rate     Status:

## 2022-04-12 NOTE — Telephone Encounter (Signed)
FYI ? ?Spoke with pt's daughter, [name], per daughter, stated that per Mel pt's PT said that the pt is dehydrated per OV 04/11/22. Per daughter, started to have pt drink more. However, she is concern that pt has now started talking and seeing things that are not real ie points to  a flowers on the wall. Also notice how  hard it is for pt  to stand/or walk, holding on things to walk and get up. All these behavorial change started on Sunday. Per daughter, believes that pt may have had a mini-stroke. Suggest for them to take pt to the ER to be evaluated, However, daughter, Vaughan Basta, stated that she preferred not to do that because pt always have someone with him/plus they will keep an eye on him. Pt has an appointment for Thursday,04/14/22 at [facility].  ? ?Per daughter, [name], Mel/PT will send a nurse to pt's house to evaluate.  ? ?

## 2022-04-12 NOTE — Progress Notes (Addendum)
EDREI, NORGAARD T. ([id number]) ?Visit Report for 04/12/2022 ?Chief Complaint Document Details ?Patient Name: [name], [name] ?Date of Service: 04/12/2022 11:15 AM ?Medical Record Number: [id number] ?Patient Account Number: [id number] ?Date of Birth/Sex: 1919/04/22 (88 y.o. M) ?Treating RN: Levora Dredge ?Primary Care Provider: Jenna Luo Other Clinician: ?Referring Provider: Jenna Luo ?Treating Provider/Extender: Jeri Cos ?Weeks in Treatment: 4 ?Information Obtained from: Patient ?Chief Complaint ?Right foot and ankle ulcers ?Electronic Signature(s) ?Signed: 04/12/2022 11:25:33 AM By: Worthy Keeler PA-C ?Entered By: Worthy Keeler on 04/12/2022 11:25:32 ?CHRISTOPH, COPELAN T. ([id number]) ?-------------------------------------------------------------------------------- ?HPI Details ?Patient Name: [name] ?Date of Service: 04/12/2022 11:15 AM ?Medical Record Number: [id number] ?Patient Account Number: [id number] ?Date of Birth/Sex: 24-Nov-1919 (88 y.o. M) ?Treating RN: Levora Dredge ?Primary Care Provider: Jenna Luo Other Clinician: ?Referring Provider: Jenna Luo ?Treating Provider/Extender: Jeri Cos ?Weeks in Treatment: 4 ?History of Present Illness ?Location: left and right dorsum foot and in between his toes ?Quality: Patient reports No Pain. ?Severity: Patient states wound are getting worse. ?Duration: Patient has had the wound for < 2 weeks prior to presenting for treatment ?Timing: Pain in wound is Intermittent (comes and goes ?Context: The wound appeared gradually over time ?Modifying Factors: Other treatment(s) tried include:admitted to the hospital and had a arterial workup and has been on oral antibiotics ?Associated Signs and Symptoms: Patient reports having increase discharge. ?HPI Description: 88 year old gentleman comes from [facility] with a history of having recently been admitted to the hospital ?between February 20 and 01/20/2017, and he was admitted with cellulitis  and peripheral vascular disease with possible status dermatitis and an ?open toe wound. he is got a medical history significant of peripheral vascular disease, hypertension, non-Hodgkin's lymphoma in remission and ?amputation of the left great toe. status post amputation of the left big toe in July 2016 and biopsy of the left neck mass in October 2014. he does ?not smoke at present and given In the 80s. I understand in the past he's had a venous duplex study done and was treated for symptomatic ?varicose veins with compression stockings. ?Patient was treated in hospital with Rocephin and Flagyl and was discharged home on Keflex and Diflucan for 7 days. ?He had a ABI which showed reduced light flow in the left lower extremity and vascular surgery recommended an outpatient angiogram. right ABI ?was 0.94 with a monophasic flow in the left ABI was 0.67 with a monophasic flow. was seen by the vascular surgeon Dr. Gwenlyn Saran who noted wounds ?of both left and right toes with depressed ABIs bilaterally and objective evidence of chronic venous disease. ?x-ray of the left foot -- IMPRESSION:1. Diffuse soft tissue swelling and osteopenia. No obvious bony destructive process or periosteal reaction. ?2. Patient is status post amputation of the tuft of the first distal phalanx. ?Readmission: ?03-15-2022 upon evaluation today patient appears to be doing poorly in regard to a wound over the lateral portion of his right foot as well as the ?medial portion of his right ankle. Subsequently the ankle actually is doing decently well. The foot is not quite as good. There is a lot of eschar ?noted and necrotic tissue in general. The foot has been present since around the beginning of January the ankle for 2 weeks. The patient likely is ?going require an x-ray and it was done that at this time of the foot I think that would be ideal to ensure there is no obvious signs of osteomyelitis. ?The patient has currently been using Silvadene cream. He is  currently 88 years old. He will be 89 on September 17, 2019. With that being said ?based on what we are seeing currently he does have some peripheral vascular disease I did review his arterial studies performed at vascular and ?it did show that he had a right ABI of 0.76 the left ABI of 0.79 and a right TBI of 0.39 and a left TBI of 0.44. ?The patient does have a history of chronic venous insufficiency, peripheral vascular disease, hypertension, but he is not diabetic. ?03-22-2022 upon evaluation today patient's wound actually is showing signs of improvement which is good news. I do feel like that his blood flow ?is not the absolute best but is also not the absolute worst. He does seem to have some good granulation occurring here and my hope is that we ?will be able to get this to heal without any further significant compromise. Overall the patient seems to be doing quite well. ?03-29-2022 upon evaluation today patient appears to be doing well currently in regard to his wounds. He has been tolerating the dressing changes ?without complication. Fortunately I do not see any evidence of active infection locally or systemically which is great news. I did have to perform ?some debridement regard to the lateral foot he tolerated that without complication there is tendon exposed I do believe we need to switch at this ?point to a different dressing to try to see if we can help with some additional granulation growth. ?04-05-2022 upon evaluation today patient appears to be doing well with regard to his foot ulcer and ankle ulcers. Fortunately there does not appear ?to be any evidence of active infection locally or systemically which is great news. No fevers, chills, nausea, vomiting, or diarrhea. We do have ?NuShield here available for application if indeed we want to go forward with that. ?04-12-2022 upon evaluation today patient appears to be doing poorly in regard to his mental status. This is a change compared to his normal.  He ?generally does not have any issues with dementia Alzheimer's or otherwise. With that being said he did not recognize his daughter this morning at ?all. This is definitely a change for him. He is also normally able to stand and pivot some applying pressure right now they are having a hard time ?even getting in and out of the car and get him in here. Subsequently his daughter stated that they were concerned about him having "a stroke". ?With that being said I do not see any evidence of a stroke based on what I am seeing today but I do feel like that he is not doing nearly as well as ?what he has been in the past. Even last week I did not see any evidence of symptoms similar to what were having an experience at this point. I ?does have me concerned. We had applied the first NuShield last week again we will get a hold off on that for this week. ?Electronic Signature(s) ?Signed: 04/12/2022 12:37:32 PM By: Worthy Keeler PA-C ?Entered By: Worthy Keeler on 04/12/2022 12:37:32 ?[place], Sabastien T. ([id number]) ?-------------------------------------------------------------------------------- ?Physical Exam Details ?Patient Name: GHAZI, RUMPF ?Date of Service: 04/12/2022 11:15 AM ?Medical Record Number: [id number] ?Patient Account Number: [id number] ?Date of Birth/Sex: Nov 22, 1919 (88 y.o. M) ?Treating RN: Levora Dredge ?Primary Care Provider: Jenna Luo Other Clinician: ?Referring Provider: Jenna Luo ?Treating Provider/Extender: Jeri Cos ?Weeks in Treatment: 4 ?Constitutional ?patient is hypertensive.. pulse regular and within target range for patient.Marland Kitchen respirations regular, non-labored and within target range for  patient.. ?temperature within target range for patient.. Chronically ill appearing but in no apparent acute distress. ?Eyes ?conjunctiva clear no eyelid edema noted. pupils equal round and reactive to light and accommodation. ?Ears, Nose, Mouth, and Throat ?no gross abnormality of ear auricles or  external auditory canals. normal hearing noted during conversation. mucus membranes moist. ?Respiratory ?normal breathing without difficulty. ?Psychiatric ?Patient is not able to cooperate in decision ma

## 2022-04-12 NOTE — ED Notes (Signed)
Rainbow was sent to the lab. 

## 2022-04-12 NOTE — Progress Notes (Addendum)
KEMET, NIJJAR T. ([id number]) ?Visit Report for 04/12/2022 ?Arrival Information Details ?Patient Name: [name], [name] ?Date of Service: 04/12/2022 11:15 AM ?Medical Record Number: [id number] ?Patient Account Number: [id number] ?Date of Birth/Sex: 09/06/1919 (88 y.o. M) ?Treating RN: Levora Dredge ?Primary Care Keia Rask: Jenna Luo Other Clinician: ?Referring Shemuel Harkleroad: Jenna Luo ?Treating Kellina Dreese/Extender: Jeri Cos ?Weeks in Treatment: 4 ?Visit Information History Since Last Visit ?Added or deleted any medications: No ?Patient Arrived: Wheel Chair ?Any new allergies or adverse reactions: No ?Arrival Time: 11:37 ?Had a fall or experienced change in No ?Accompanied By: daughter ?activities of daily living that may affect ?Transfer Assistance: None ?risk of falls: ?Patient Identification Verified: Yes ?Hospitalized since last visit: No ?Secondary Verification Process Completed: Yes ?Has Dressing in Place as Prescribed: Yes ?Patient Requires Transmission-Based No ?Pain Present Now: No ?Precautions: ?Patient Has Alerts: Yes ?Patient Alerts: Patient on Blood ?Thinner ?12/31/21 ABI L 0.79 R ?0.76 ?not Diabetic ?Notes ?pt left in wheelchair due to being unstable, and daughters thinking possible stroke, PA Stone made aware ?Electronic Signature(s) ?Signed: 04/12/2022 4:14:45 PM By: Levora Dredge ?Entered By: Levora Dredge on 04/12/2022 11:50:29 ?[place], Jimmy T. ([id number]) ?-------------------------------------------------------------------------------- ?Clinic Level of Care Assessment Details ?Patient Name: [name], [name] ?Date of Service: 04/12/2022 11:15 AM ?Medical Record Number: [id number] ?Patient Account Number: [id number] ?Date of Birth/Sex: 27-Jul-1919 (88 y.o. M) ?Treating RN: Levora Dredge ?Primary Care Clemence Lengyel: Jenna Luo Other Clinician: ?Referring Hildur Bayer: Jenna Luo ?Treating Paiden Caraveo/Extender: Jeri Cos ?Weeks in Treatment: 4 ?Clinic Level of Care Assessment Items ?TOOL 4 Quantity  Score ?'[]'$  - Use when only an EandM is performed on FOLLOW-UP visit 0 ?ASSESSMENTS - Nursing Assessment / Reassessment ?X - Reassessment of Co-morbidities (includes updates in patient status) 1 10 ?X- 1 5 ?Reassessment of Adherence to Treatment Plan ?ASSESSMENTS - Wound and Skin Assessment / Reassessment ?'[]'$  - Simple Wound Assessment / Reassessment - one wound 0 ?'[]'$  - 0 ?Complex Wound Assessment / Reassessment - multiple wounds ?'[]'$  - 0 ?Dermatologic / Skin Assessment (not related to wound area) ?ASSESSMENTS - Focused Assessment ?'[]'$  - Circumferential Edema Measurements - multi extremities 0 ?'[]'$  - 0 ?Nutritional Assessment / Counseling / Intervention ?'[]'$  - 0 ?Lower Extremity Assessment (monofilament, tuning fork, pulses) ?'[]'$  - 0 ?Peripheral Arterial Disease Assessment (using hand held doppler) ?ASSESSMENTS - Ostomy and/or Continence Assessment and Care ?'[]'$  - Incontinence Assessment and Management 0 ?'[]'$  - 0 ?Ostomy Care Assessment and Management (repouching, etc.) ?PROCESS - Coordination of Care ?X - Simple Patient / Family Education for ongoing care 1 15 ?'[]'$  - 0 ?Complex (extensive) Patient / Family Education for ongoing care ?'[]'$  - 0 ?Staff obtains Consents, Records, Test Results / Process Orders ?'[]'$  - 0 ?Staff telephones HHA, Nursing Homes / Clarify orders / etc ?'[]'$  - 0 ?Routine Transfer to another Facility (non-emergent condition) ?'[]'$  - 0 ?Routine Hospital Admission (non-emergent condition) ?'[]'$  - 0 ?New Admissions / Biomedical engineer / Ordering NPWT, Apligraf, etc. ?X- 1 20 ?Emergency Hospital Admission (emergent condition) ?'[]'$  - 0 ?Simple Discharge Coordination ?X- 1 15 ?Complex (extensive) Discharge Coordination ?PROCESS - Special Needs ?'[]'$  - Pediatric / Minor Patient Management 0 ?'[]'$  - 0 ?Isolation Patient Management ?'[]'$  - 0 ?Hearing / Language / Visual special needs ?'[]'$  - 0 ?Assessment of Community assistance (transportation, D/C planning, etc.) ?X- 1 15 ?Additional assistance / Altered mentation ?'[]'$  -  0 ?Support Surface(s) Assessment (bed, cushion, seat, etc.) ?INTERVENTIONS - Wound Cleansing / Measurement ?BERRY, Jimmy T. ([id number]) ?'[]'$  - 0 ?Simple Wound Cleansing - one wound ?'[]'$  - 0 ?Complex  Wound Cleansing - multiple wounds ?'[]'$  - 0 ?Wound Imaging (photographs - any number of wounds) ?'[]'$  - 0 ?Wound Tracing (instead of photographs) ?'[]'$  - 0 ?Simple Wound Measurement - one wound ?'[]'$  - 0 ?Complex Wound Measurement - multiple wounds ?INTERVENTIONS - Wound Dressings ?X - Small Wound Dressing one or multiple wounds 1 10 ?'[]'$  - 0 ?Medium Wound Dressing one or multiple wounds ?'[]'$  - 0 ?Large Wound Dressing one or multiple wounds ?'[]'$  - 0 ?Application of Medications - topical ?'[]'$  - 0 ?Application of Medications - injection ?INTERVENTIONS - Miscellaneous ?'[]'$  - External ear exam 0 ?'[]'$  - 0 ?Specimen Collection (cultures, biopsies, blood, body fluids, etc.) ?'[]'$  - 0 ?Specimen(s) / Culture(s) sent or taken to Lab for analysis ?'[]'$  - 0 ?Patient Transfer (multiple staff / Civil Service fast streamer / Similar devices) ?'[]'$  - 0 ?Simple Staple / Suture removal (25 or less) ?'[]'$  - 0 ?Complex Staple / Suture removal (26 or more) ?'[]'$  - 0 ?Hypo / Hyperglycemic Management (close monitor of Blood Glucose) ?'[]'$  - 0 ?Ankle / Brachial Index (ABI) - do not check if billed separately ?X- 1 5 ?Vital Signs ?Has the patient been seen at the hospital within the last three years: Yes ?Total Score: 95 ?Level Of Care: New/Established - Level ?3 ?Electronic Signature(s) ?Signed: 04/12/2022 4:14:45 PM By: Levora Dredge ?Entered By: Levora Dredge on 04/12/2022 13:07:38 ?[place], Jimmy T. ([id number]) ?-------------------------------------------------------------------------------- ?Encounter Discharge Information Details ?Patient Name: [name], [name] ?Date of Service: 04/12/2022 11:15 AM ?Medical Record Number: [id number] ?Patient Account Number: [id number] ?Date of Birth/Sex: 11/30/1918 (88 y.o. M) ?Treating RN: Levora Dredge ?Primary Care Codylee Patil: Jenna Luo Other  Clinician: ?Referring Del Overfelt: Jenna Luo ?Treating Maiya Kates/Extender: Jeri Cos ?Weeks in Treatment: 4 ?Encounter Discharge Information Items ?Discharge Condition: Stable ?Ambulatory Status: Wheelchair ?Discharge Destination: Emergency Room ?Telephoned: Yes ?Spoke With: Camera operator per PA Stone ?Orders Sent: No ?Transportation: Ambulance ?Accompanied By: daughter ?Schedule Follow-up Appointment: Yes ?Clinical Summary of Care: Patient Declined ?Notes ?pt sent with face sheet, recent vital signs and medication list ?Electronic Signature(s) ?Signed: 04/12/2022 1:09:15 PM By: Levora Dredge ?Entered By: Levora Dredge on 04/12/2022 13:09:15 ?[place] [name] ([id number]) ?-------------------------------------------------------------------------------- ?Lower Extremity Assessment Details ?Patient Name: [name], [name] ?Date of Service: 04/12/2022 11:15 AM ?Medical Record Number: [id number] ?Patient Account Number: [id number] ?Date of Birth/Sex: 1919/05/21 (88 y.o. M) ?Treating RN: Levora Dredge ?Primary Care Jahmier Willadsen: Jenna Luo Other Clinician: ?Referring Antwine Agosto: Jenna Luo ?Treating Navdeep Halt/Extender: Jeri Cos ?Weeks in Treatment: 4 ?Notes ?not assessed due to patients condition and needing to go to Ed ?Electronic Signature(s) ?Signed: 04/12/2022 1:05:24 PM By: Levora Dredge ?Entered By: Levora Dredge on 04/12/2022 13:05:24 ?[place], Jimmy T. ([id number]) ?-------------------------------------------------------------------------------- ?Multi Wound Chart Details ?Patient Name: [name], [name] ?Date of Service: 04/12/2022 11:15 AM ?Medical Record Number: [id number] ?Patient Account Number: [id number] ?Date of Birth/Sex: 10-Nov-1919 (88 y.o. M) ?Treating RN: Levora Dredge ?Primary Care Candee Hoon: Jenna Luo Other Clinician: ?Referring Patrycja Mumpower: Jenna Luo ?Treating Kaleb Linquist/Extender: Jeri Cos ?Weeks in Treatment: 4 ?Vital Signs ?Height(in): 73 ?Pulse(bpm): 63 ?Weight(lbs): 200 ?Blood  Pressure(mmHg): 155/78 ?Body Mass Index(BMI): 26.4 ?Temperature(??F): 97.4 ?Respiratory Rate(breaths/min): 18 ?Wound Assessments ?Treatment Notes ?Electronic Signature(s) ?Signed: 04/12/2022 1:05:41 PM By: Sherre Lain

## 2022-04-12 NOTE — ED Provider Notes (Signed)
? ?[facility] ?Provider Note ? ? ? Event Date/Time  ? First MD Initiated Contact with Patient 04/12/22 1558   ?  (approximate) ? ? ?History  ? ?Altered Mental Status ? ? ?HPI ? ?[name] is a 88 y.o. male who presents to the ED for evaluation of Altered Mental Status ?  ?I reviewed cardiology clinic visit from 4/6.  Wound to the right foot.  Hypothyroidism, CHF, HTN.  Anticoagulated on Xarelto.  Persistent atrial fibrillation and has a pacer due to CHB. ? ?Daughters bring patient to the ED for evaluation of altered mentation.  Patient typically lives at home by himself, but family is nearby and checks in on him multiple times per day to help out.  They help with his medications. ? ?He was seen at the wound care clinic today and apparently did not recognize he did not know the names of his daughters who are with him.  Daughters report this just happened in the past 24 hours. ? ?They tell me that the wound care PA has had the patient on Bactrim for the past 1 month to treat a bone infection in his right foot as an outpatient. ? ?Physical Exam  ? ?Triage Vital Signs: ?ED Triage Vitals  ?Enc Vitals Group  ?   BP 04/12/22 1326 (!) 159/80  ?   Pulse Rate 04/12/22 1326 (!) 122  ?   Resp 04/12/22 1326 (!) 21  ?   Temp 04/12/22 1326 97.6 ?F (36.4 ?C)  ?   Temp Source 04/12/22 1326 Oral  ?   SpO2 04/12/22 1326 94 %  ?   Weight --   ?   Height --   ?   Head Circumference --   ?   Peak Flow --   ?   Pain Score 04/12/22 1259 0  ?   Pain Loc --   ?   Pain Edu? --   ?   Excl. in [place]? --   ? ? ?Most recent vital signs: ?Vitals:  ? 04/12/22 1915 04/12/22 1930  ?BP:  (!) 149/75  ?Pulse: 70 63  ?Resp: 15 14  ?Temp:    ?SpO2: 99% 100%  ? ? ?General: Awake, pleasantly disoriented.  Follows commands in all 4 extremities.  Hard of hearing. ?CV:  Good peripheral perfusion.  ?Resp:  Normal effort.  ?Abd:  No distention.  Soft and benign. ?MSK:  Wound to the lateral aspect of the right foot, as pictured below.  No  surrounding erythema, induration or fluctuance.  No purulence. ?Neuro:  No focal deficits appreciated. ?Other:   ? ? ? ? ?ED Results / Procedures / Treatments  ? ?Labs ?(all labs ordered are listed, but only abnormal results are displayed) ?Labs Reviewed  ?COMPREHENSIVE METABOLIC PANEL - Abnormal; Notable for the following components:  ?    Result Value  ? Glucose, Bld 121 (*)   ? BUN 33 (*)   ? Creatinine, Ser 2.24 (*)   ? Total Protein 8.5 (*)   ? GFR, Estimated 26 (*)   ? All other components within normal limits  ?URINALYSIS, COMPLETE (UACMP) WITH MICROSCOPIC - Abnormal; Notable for the following components:  ? Color, Urine BROWN (*)   ? APPearance TURBID (*)   ? Glucose, UA   (*)   ? Value: TEST NOT REPORTED DUE TO COLOR INTERFERENCE OF URINE PIGMENT  ? Hgb urine dipstick   (*)   ? Value: TEST NOT REPORTED DUE TO COLOR INTERFERENCE OF  URINE PIGMENT  ? Bilirubin Urine   (*)   ? Value: TEST NOT REPORTED DUE TO COLOR INTERFERENCE OF URINE PIGMENT  ? Ketones, ur   (*)   ? Value: TEST NOT REPORTED DUE TO COLOR INTERFERENCE OF URINE PIGMENT  ? Protein, ur   (*)   ? Value: TEST NOT REPORTED DUE TO COLOR INTERFERENCE OF URINE PIGMENT  ? Nitrite   (*)   ? Value: TEST NOT REPORTED DUE TO COLOR INTERFERENCE OF URINE PIGMENT  ? Leukocytes,Ua   (*)   ? Value: TEST NOT REPORTED DUE TO COLOR INTERFERENCE OF URINE PIGMENT  ? RBC / HPF >50 (*)   ? WBC, UA >50 (*)   ? All other components within normal limits  ?BRAIN NATRIURETIC PEPTIDE - Abnormal; Notable for the following components:  ? B Natriuretic Peptide 159.2 (*)   ? All other components within normal limits  ?CBC  ?PROCALCITONIN  ?SEDIMENTATION RATE  ?PROCALCITONIN  ?C-REACTIVE PROTEIN  ?PREALBUMIN  ?COMPREHENSIVE METABOLIC PANEL  ?CBC  ?CBG MONITORING, ED  ? ? ?EKG ?Ventricularly paced rhythm with a rate of 63 bpm.  Normal axis and appropriate intervals with LBBB morphology.  No STEMI per Sgarbossa criteria. ? ?RADIOLOGY ?CT head interpreted by me without evidence of  acute intracranial pathology ?1 view CXR interpreted by me with cardiomegaly and pulmonary vascular congestion without pleural effusion or infiltrate ? ?Official radiology report(s): ?CT ABDOMEN PELVIS WO CONTRAST ? ?Result Date: 04/12/2022 ?CLINICAL DATA:  Nausea, lower abdominal pain, dark urine, question ureteral calculus; also with increased weakness, not sleeping, altered mental status, hallucinations EXAM: CT ABDOMEN AND PELVIS WITHOUT CONTRAST TECHNIQUE: Multidetector CT imaging of the abdomen and pelvis was performed following the standard protocol without IV contrast. RADIATION DOSE REDUCTION: This exam was performed according to the departmental dose-optimization program which includes automated exposure control, adjustment of the mA and/or kV according to patient size and/or use of iterative reconstruction technique. COMPARISON:  11/06/2018 FINDINGS: Lower chest: Bibasilar pulmonary infiltrates. Pacemaker leads RIGHT atrium and RIGHT ventricle. Hepatobiliary: Gallbladder surgically absent.  Liver unremarkable. Pancreas: Atrophic pancreas without mass Spleen: Normal appearing spleen.  Two adjacent splenules. Adrenals/Urinary Tract: Adrenal glands normal appearance. Renal cortical thinning bilaterally. Kidneys, ureters, and bladder otherwise normal appearance. No evidence of renal mass or urinary tract calcification. No hydronephrosis or hydroureter. Bladder unremarkable. Stomach/Bowel: Diffuse colonic diverticulosis without evidence of diverticulitis. Normal appendix. Large hiatal hernia. Remaining bowel loops unremarkable. Vascular/Lymphatic: Atherosclerotic calcifications aorta, iliac arteries, visceral arteries, coronary arteries. Aorta normal caliber. No adenopathy. Reproductive: Prostatic enlargement. Other: No free air or free fluid. No hernia or inflammatory process. Musculoskeletal: Diffuse osseous demineralization. IMPRESSION: Colonic diverticulosis without evidence of diverticulitis. Large hiatal  hernia. Prostatic enlargement. No evidence of urinary tract calcification or obstruction. Bibasilar pulmonary infiltrates. Aortic Atherosclerosis (ICD10-I70.0). Electronically Signed   By: Lavonia Dana M.D.   On: 04/12/2022 17:51  ? ?CT HEAD WO CONTRAST (5MM) ? ?Result Date: 04/12/2022 ?CLINICAL DATA:  Altered mental status, weakness, not sleeping EXAM: CT HEAD WITHOUT CONTRAST TECHNIQUE: Contiguous axial images were obtained from the base of the skull through the vertex without intravenous contrast. RADIATION DOSE REDUCTION: This exam was performed according to the departmental dose-optimization program which includes automated exposure control, adjustment of the mA and/or kV according to patient size and/or use of iterative reconstruction technique. COMPARISON:  CT head 05/27/2020 FINDINGS: Brain: There is no acute intracranial hemorrhage, extra-axial fluid collection, or acute infarct. Parenchymal volume is within normal limits for age. The ventricles are stable  in size. Patchy hypodensity in the subcortical and periventricular white matter likely reflects sequela of chronic white matter microangiopathy. There is no mass lesion.  There is no mass effect or midline shift. Vascular: There is calcification of the bilateral cavernous ICAs and vertebral arteries. Skull: Normal. Negative for fracture or focal lesion. Sinuses/Orbits: Paranasal sinuses are clear. A right lens implant is noted. The globes and orbits are otherwise unremarkable. Other: None. IMPRESSION: No acute intracranial pathology. No significant interval change since 2021. Electronically Signed   By: Valetta Mole M.D.   On: 04/12/2022 13:27  ? ?DG Chest Portable 1 View ? ?Result Date: 04/12/2022 ?CLINICAL DATA:  Altered mental status. EXAM: PORTABLE CHEST 1 VIEW COMPARISON:  05/29/2020 FINDINGS: Cardiomegaly and pulmonary vascular congestion noted. Mild interstitial opacities are noted suspicious for mild edema. A LEFT-sided pacemaker is again identified.  No pneumothorax or large pleural effusions noted. No acute bony abnormalities are identified. IMPRESSION: Cardiomegaly with pulmonary vascular congestion and probable mild interstitial edema. Electroni

## 2022-04-13 ENCOUNTER — Inpatient Hospital Stay: Payer: Medicare Other

## 2022-04-13 ENCOUNTER — Encounter: Payer: Self-pay | Admitting: Internal Medicine

## 2022-04-13 DIAGNOSIS — M86171 Other acute osteomyelitis, right ankle and foot: Secondary | ICD-10-CM | POA: Diagnosis not present

## 2022-04-13 DIAGNOSIS — N179 Acute kidney failure, unspecified: Secondary | ICD-10-CM

## 2022-04-13 DIAGNOSIS — R0989 Other specified symptoms and signs involving the circulatory and respiratory systems: Secondary | ICD-10-CM | POA: Diagnosis present

## 2022-04-13 DIAGNOSIS — J9601 Acute respiratory failure with hypoxia: Secondary | ICD-10-CM | POA: Diagnosis present

## 2022-04-13 LAB — COMPREHENSIVE METABOLIC PANEL
ALT: 10 U/L (ref 0–44)
AST: 22 U/L (ref 15–41)
Albumin: 3.8 g/dL (ref 3.5–5.0)
Alkaline Phosphatase: 57 U/L (ref 38–126)
Anion gap: 7 (ref 5–15)
BUN: 30 mg/dL — ABNORMAL HIGH (ref 8–23)
CO2: 22 mmol/L (ref 22–32)
Calcium: 9 mg/dL (ref 8.9–10.3)
Chloride: 106 mmol/L (ref 98–111)
Creatinine, Ser: 2 mg/dL — ABNORMAL HIGH (ref 0.61–1.24)
GFR, Estimated: 29 mL/min — ABNORMAL LOW (ref >60)
Glucose, Bld: 108 mg/dL — ABNORMAL HIGH (ref 70–99)
Potassium: 5 mmol/L (ref 3.5–5.1)
Sodium: 135 mmol/L (ref 135–145)
Total Bilirubin: 0.7 mg/dL (ref 0.3–1.2)
Total Protein: 7.3 g/dL (ref 6.5–8.1)

## 2022-04-13 LAB — CBC
HCT: 37.3 % — ABNORMAL LOW (ref 39.0–52.0)
Hemoglobin: 12 g/dL — ABNORMAL LOW (ref 13.0–17.0)
MCH: 29.3 pg (ref 26.0–34.0)
MCHC: 32.2 g/dL (ref 30.0–36.0)
MCV: 91 fL (ref 80.0–100.0)
Platelets: 158 10*3/uL (ref 150–400)
RBC: 4.1 MIL/uL — ABNORMAL LOW (ref 4.22–5.81)
RDW: 15.2 % (ref 11.5–15.5)
WBC: 6 10*3/uL (ref 4.0–10.5)
nRBC: 0 % (ref 0.0–0.2)

## 2022-04-13 LAB — PROCALCITONIN: Procalcitonin: <0.1 ng/mL

## 2022-04-13 MED ORDER — PIPERACILLIN-TAZOBACTAM 3.375 G IVPB
3.3750 g | Freq: Three times a day (TID) | INTRAVENOUS | Status: DC
  Administered 2022-04-13 – 2022-04-17 (×12): 3.375 g via INTRAVENOUS
  Filled 2022-04-13 (×13): qty 50

## 2022-04-13 MED ORDER — SODIUM CHLORIDE 0.9 % IV SOLN: INTRAVENOUS | Status: DC

## 2022-04-13 MED ORDER — IPRATROPIUM-ALBUTEROL 0.5-2.5 (3) MG/3ML IN SOLN
3.0000 mL | RESPIRATORY_TRACT | Status: AC
  Administered 2022-04-13: 3 mL via RESPIRATORY_TRACT
  Filled 2022-04-13: qty 3

## 2022-04-13 MED ORDER — ALPRAZOLAM 0.25 MG PO TABS
0.2500 mg | ORAL_TABLET | Freq: Two times a day (BID) | ORAL | Status: DC | PRN
  Administered 2022-04-13: 0.25 mg via ORAL
  Filled 2022-04-13: qty 1

## 2022-04-13 MED ORDER — LORAZEPAM 2 MG/ML IJ SOLN
0.5000 mg | INTRAMUSCULAR | Status: DC | PRN
  Administered 2022-04-13 – 2022-04-14 (×3): 0.5 mg via INTRAVENOUS
  Filled 2022-04-13 (×4): qty 1

## 2022-04-13 MED ORDER — SODIUM CHLORIDE 0.9 % IV SOLN
100.0000 mg | Freq: Two times a day (BID) | INTRAVENOUS | Status: DC
Start: 2022-04-13 — End: 2022-04-14
  Administered 2022-04-13 – 2022-04-14 (×3): 100 mg via INTRAVENOUS
  Filled 2022-04-13 (×4): qty 100

## 2022-04-13 MED ORDER — FUROSEMIDE 10 MG/ML IJ SOLN
40.0000 mg | Freq: Once | INTRAMUSCULAR | Status: AC
  Administered 2022-04-13: 40 mg via INTRAVENOUS
  Filled 2022-04-13: qty 4

## 2022-04-13 MED ORDER — HALOPERIDOL LACTATE 5 MG/ML IJ SOLN
1.0000 mg | Freq: Three times a day (TID) | INTRAMUSCULAR | Status: DC | PRN
  Administered 2022-04-13: 1 mg via INTRAVENOUS
  Filled 2022-04-13 (×2): qty 1

## 2022-04-13 MED ORDER — ACETAMINOPHEN 325 MG PO TABS
650.0000 mg | ORAL_TABLET | Freq: Once | ORAL | Status: AC
  Administered 2022-04-13: 650 mg via ORAL
  Filled 2022-04-13: qty 2

## 2022-04-13 MED ORDER — IPRATROPIUM-ALBUTEROL 0.5-2.5 (3) MG/3ML IN SOLN
3.0000 mL | Freq: Four times a day (QID) | RESPIRATORY_TRACT | Status: DC
  Administered 2022-04-14 (×2): 3 mL via RESPIRATORY_TRACT
  Filled 2022-04-13 (×3): qty 3

## 2022-04-13 MED ORDER — QUETIAPINE FUMARATE 25 MG PO TABS
12.5000 mg | ORAL_TABLET | Freq: Every day | ORAL | Status: DC
Start: 2022-04-13 — End: 2022-04-14

## 2022-04-13 NOTE — Assessment & Plan Note (Signed)
Protonix.  ?

## 2022-04-13 NOTE — Telephone Encounter (Signed)
Msg taken care of by Dr. Dennard Schaumann.  ?

## 2022-04-13 NOTE — ED Notes (Signed)
Pt transported to US

## 2022-04-13 NOTE — Progress Notes (Signed)
OT Cancellation Note ? ?Patient Details ?Name: [name] ?MRN: [id number] ?DOB: [birth year] ? ? ?Cancelled Treatment:    Reason Eval/Treat Not Completed: Medical issues which prohibited therapy. Order received and chart reviewed. Per conversation with RN, wound care team recommending podiatry consult, ok to hold until consult completed for [facility] recommendations. Will re-attempt as able.  ? ?Dessie Coma, M.S. OTR/L  ?04/13/22, 11:28 AM  ?ascom [phone] ? ?

## 2022-04-13 NOTE — Plan of Care (Signed)
PMT spoke with family. They will come tonight to see him and make decisions going forward. Full note to follow.  ?

## 2022-04-13 NOTE — Assessment & Plan Note (Signed)
Pt has 1+ BL and we will obtain ABI. ? ?

## 2022-04-13 NOTE — ED Notes (Addendum)
This RN and Scientist, clinical (histocompatibility and immunogenetics) at bedside. Pt awake and remains altered. Pt slid down in bed and pulling monitoring cords off. Pt repeatedly reaching/posturing towards the ceiling. Pt repositioned in bed. MD messaged for possible palliative consult. ?

## 2022-04-13 NOTE — Consult Note (Addendum)
? ?Reason for Consult: Osteomyelitis right foot ?Referring Physician: Dr. Lupita Leash ? ?[name] is an 88 y.o. male.  ?HPI: 88 year old male with chronic right foot ulcer and likely osteomyelitis followed by the wound care center admitted with altered mental status.  Podiatry consulted for question of osteomyelitis and management.  He is very agitated and unable to provide any history.  Completely and oriented.  His daughter is here and provides some history says the last year has been very difficult, he continues to decline and his wounds have not healed ? ?Past Medical History:  ?Diagnosis Date  ? Acid reflux   ? Cancer ([place]) 09/05/13  ? left neck-non-hodgkins lymphoma  ? CKD (chronic kidney disease), stage IV ([place]) 11/07/2018  ? Hypertension   ? requires diuretic  ? Hypothyroidism   ? Malaria   ? while in the service  ? Peripheral vascular disease ([place])   ? slow wound healing on legs  ? ? ?Past Surgical History:  ?Procedure Laterality Date  ? AMPUTATION Left 06/25/2015  ? Procedure: LEFT  HALLUX AMPUTATION,;  Surgeon: Wylene Simmer, MD;  Location: [facility];  Service: Orthopedics;  Laterality: Left;  LOCAL/MAC  ? CHOLECYSTECTOMY N/A 11/08/2018  ? Procedure: LAPAROSCOPIC CHOLECYSTECTOMY WITH INTRAOPERATIVE CHOLANGIOGRAM;  Surgeon: [name], MD;  Location: [facility];  Service: General;  Laterality: N/A;  ? MASS BIOPSY Left 09/05/2013  ? Procedure: EXCISIONAL BIOPSY OF LEFT NECK MASS;  Surgeon: Jerrell Belfast, MD;  Location: [facility];  Service: ENT;  Laterality: Left;  ? PACEMAKER IMPLANT N/A 05/28/2020  ? Procedure: PACEMAKER IMPLANT;  Surgeon: Thompson Grayer, MD;  Location: [facility];  Service: Cardiovascular;  Laterality: N/A;  ? TONSILLECTOMY    ? YAG LASER APPLICATION Right 0/27/2536  ? Procedure: YAG LASER APPLICATION;  Surgeon: [name], MD;  Location: [facility];  Service: Ophthalmology;  Laterality: Right;  ? ? ?Family History  ?Problem Relation Age of Onset  ? Lung cancer Brother   ? Heart  disease Brother   ? Stroke Brother   ? ? ?Social History:  reports that he has quit smoking. His smoking use included cigars. He quit smokeless tobacco use about 42 years ago. He reports that he does not drink alcohol and does not use drugs. ? ?Allergies:  ?Allergies  ?Allergen Reactions  ? Codeine Nausea And Vomiting  ? Percocet [Oxycodone-Acetaminophen] Nausea And Vomiting  ? ? ?Medications: I have reviewed the patient's current medications. ? ?Results for orders placed or performed during the hospital encounter of 04/12/22 (from the past 48 hour(s))  ?Comprehensive metabolic panel     Status: Abnormal  ? Collection Time: 04/12/22  1:34 PM  ?Result Value Ref Range  ? Sodium 136 135 - 145 mmol/L  ? Potassium 4.9 3.5 - 5.1 mmol/L  ? Chloride 103 98 - 111 mmol/L  ? CO2 24 22 - 32 mmol/L  ? Glucose, Bld 121 (H) 70 - 99 mg/dL  ?  Comment: Glucose reference range applies only to samples taken after fasting for at least 8 hours.  ? BUN 33 (H) 8 - 23 mg/dL  ? Creatinine, Ser 2.24 (H) 0.61 - 1.24 mg/dL  ? Calcium 9.7 8.9 - 10.3 mg/dL  ? Total Protein 8.5 (H) 6.5 - 8.1 g/dL  ? Albumin 4.6 3.5 - 5.0 g/dL  ? AST 23 15 - 41 U/L  ? ALT 11 0 - 44 U/L  ? Alkaline Phosphatase 70 38 - 126 U/L  ? Total Bilirubin 0.4 0.3 -  1.2 mg/dL  ? GFR, Estimated 26 (L) >60 mL/min  ?  Comment: (NOTE) ?Calculated using the CKD-EPI Creatinine Equation (2021) ?  ? Anion gap 9 5 - 15  ?  Comment: Performed at [facility], [address], [place], [place] [address]  ?CBC     Status: None  ? Collection Time: 04/12/22  1:34 PM  ?Result Value Ref Range  ? WBC 5.9 4.0 - 10.5 K/uL  ? RBC 4.65 4.22 - 5.81 MIL/uL  ? Hemoglobin 13.4 13.0 - 17.0 g/dL  ? HCT 42.8 39.0 - 52.0 %  ? MCV 92.0 80.0 - 100.0 fL  ? MCH 28.8 26.0 - 34.0 pg  ? MCHC 31.3 30.0 - 36.0 g/dL  ? RDW 15.3 11.5 - 15.5 %  ? Platelets 163 150 - 400 K/uL  ? nRBC 0.0 0.0 - 0.2 %  ?  Comment: Performed at [facility], [address] [place], [place] [address]  ?Brain  natriuretic peptide     Status: Abnormal  ? Collection Time: 04/12/22  1:34 PM  ?Result Value Ref Range  ? B Natriuretic Peptide 159.2 (H) 0.0 - 100.0 pg/mL  ?  Comment: Performed at [facility], [address] [place], [place] [address]  ?Urinalysis, Complete w Microscopic Urine, Clean Catch     Status: Abnormal  ? Collection Time: 04/12/22  3:37 PM  ?Result Value Ref Range  ? Color, Urine BROWN (A) YELLOW  ? APPearance TURBID (A) CLEAR  ? Specific Gravity, Urine 1.021 1.005 - 1.030  ? pH  5.0 - 8.0  ?  TEST NOT REPORTED DUE TO COLOR INTERFERENCE OF URINE PIGMENT  ? Glucose, UA (A) NEGATIVE mg/dL  ?  TEST NOT REPORTED DUE TO COLOR INTERFERENCE OF URINE PIGMENT  ? Hgb urine dipstick (A) NEGATIVE  ?  TEST NOT REPORTED DUE TO COLOR INTERFERENCE OF URINE PIGMENT  ? Bilirubin Urine (A) NEGATIVE  ?  TEST NOT REPORTED DUE TO COLOR INTERFERENCE OF URINE PIGMENT  ? Ketones, ur (A) NEGATIVE mg/dL  ?  TEST NOT REPORTED DUE TO COLOR INTERFERENCE OF URINE PIGMENT  ? Protein, ur (A) NEGATIVE mg/dL  ?  TEST NOT REPORTED DUE TO COLOR INTERFERENCE OF URINE PIGMENT  ? Nitrite (A) NEGATIVE  ?  TEST NOT REPORTED DUE TO COLOR INTERFERENCE OF URINE PIGMENT  ? Leukocytes,Ua (A) NEGATIVE  ?  TEST NOT REPORTED DUE TO COLOR INTERFERENCE OF URINE PIGMENT  ? RBC / HPF >50 (H) 0 - 5 RBC/hpf  ? WBC, UA >50 (H) 0 - 5 WBC/hpf  ? Bacteria, UA NONE SEEN NONE SEEN  ? Squamous Epithelial / LPF NONE SEEN 0 - 5  ?  Comment: Performed at [facility], [address], [place], [place] [address]  ?Procalcitonin - Baseline     Status: None  ? Collection Time: 04/12/22  4:33 PM  ?Result Value Ref Range  ? Procalcitonin <0.10 ng/mL  ?  Comment:        ?Interpretation: ?PCT (Procalcitonin) <= 0.5 ng/mL: ?Systemic infection (sepsis) is not likely. ?Local bacterial infection is possible. ?(NOTE) ?      Sepsis PCT Algorithm           Lower Respiratory Tract ?                                     Infection PCT Algorithm ?    ----------------------------     ---------------------------- ?  PCT < 0.25 ng/mL                PCT < 0.10 ng/mL ? ?        Strongly encourage             Strongly discourage ?  discontinuation of antibiotics    initiation of antibiotics ?   ----------------------------     ----------------------------- ?      PCT 0.25 - 0.50 ng/mL            PCT 0.10 - 0.25 ng/mL ?              OR ?      >80% decrease in PCT            Discourage initiation of ?                                           antibiotics ?     Encourage discontinuation ?          of antibiotics ?   ----------------------------     ----------------------------- ?        PCT >= 0.50 ng/mL              PCT 0.26 - 0.50 ng/mL ?              AND ?       <80% decrease in PCT             Encourage initiation of ?                                            antibiotics ?      Encourage continuation ?          of antibiotics ?   ----------------------------     ----------------------------- ?       PCT >= 0.50 ng/mL                  PCT > 0.50 ng/mL ?              AND ?        increase in PCT                  Strongly encourage ?                                     initiation of antibiotics ?   Strongly encourage escalation ?          of antibiotics ?                                    ----------------------------- ?                                          PCT <= 0.25 ng/mL ?  OR ?                                       > 80% decrease in PCT ? ?                                    Discontinue / Do not initiate ?                                            antibiotics ? ?Performed at [facility], [place], ?[place] [address] ?  ?Sedimentation rate     Status: None  ? Collection Time: 04/12/22  4:33 PM  ?Result Value Ref Range  ? Sed Rate 13 0 - 20 mm/hr  ?  Comment: Performed at [facility], [address] [place], [place] [address]  ?C-reactive protein     Status: None  ?  Collection Time: 04/12/22  7:51 PM  ?Result Value Ref Range  ? CRP 0.7 <1.0 mg/dL  ?  Comment: Performed at [facility], [place] [address], [place], [place] [address]  ?Prealbumin     Status: None  ? Collection Time: 04/12/22  7:5

## 2022-04-13 NOTE — Progress Notes (Signed)
PT Cancellation Note ? ?Patient Details ?Name: [name] ?MRN: [id number] ?DOB: September 12, 1919 ? ? ?Cancelled Treatment:    Reason Eval/Treat Not Completed: Medical issues which prohibited therapy ?Pt awaiting podiatry consult for R 5th met osteomyelitis.  Also appears he continues to have AMS/restlessness at this time.  Will hold PT today, continue to follow from a distance and will initiate PT eval when appropriate.  ? ?Kreg Shropshire, DPT ?04/13/2022, 3:00 PM ?

## 2022-04-13 NOTE — ED Notes (Signed)
Informed RN bed assigned 

## 2022-04-13 NOTE — Hospital Course (Addendum)
   88 year old male with hypothyroidism, congestive heart failure, CKD with baseline creatinine IV 1.96-2.19 hypertension persistent A-fib with pacer due to complete heart block, on Xarelto, wound on the right foot who lives at home by himself but family nearby brought to the ED by the daughter due to altered mental status.  Patient had delirium secondary to sleep deprivation.  He also developed aspiration pneumonia with acute hypoxemic respiratory failure.  He was treated with the Seroquel and melatonin, mental status much improved.  He has since off oxygen, was able to ambulate with the physical therapist.  Discussed with podiatry, patient will need oral antibiotics for 2 weeks for chronic osteomyelitis.  Currently pending nursing placement

## 2022-04-13 NOTE — Assessment & Plan Note (Signed)
SpO2: 94 % ?While in room pt developed hypoxia of 86 on RA.  ?Pt started on 1 L and increased to 2L nad hypoxia resolved. ?CXR showed pulmonary edema. ?Pt continued on oxygen.  ?Will start PRN lasix if his hypoxia gets worse as we are monitoring renal  Dysfunction.  ?

## 2022-04-13 NOTE — Assessment & Plan Note (Signed)
Levothyroxine 112 mg MCG. ? ?

## 2022-04-13 NOTE — Assessment & Plan Note (Addendum)
Lab Results  ?Component Value Date  ? CREATININE 2.24 (H) 04/12/2022  ? CREATININE 1.96 (H) 12/28/2021  ? CREATININE 2.19 (H) 12/24/2021  ?we will hold enalapril. ?Cont with MIVF  Pt got bolus in ed. ? ?

## 2022-04-13 NOTE — Consult Note (Signed)
Pharmacy Antibiotic Note ? ?[name] is a 88 y.o. male admitted on 04/12/2022 with osteomyelitis.  Pharmacy has been consulted for Zosyn dosing. ? ?Plan: ?Zosyn 3.375g IV q8h (4 hour infusion). ?Patient is also on doxycycline 100 mg IV q12h  ?Monitor clinical picture and renal function ?F/U C&S, abx deescalation / LOT ? ? ?Weight: 89.4 kg (197 lb 1.5 oz) (02/2022 weight) ? ?Temp (24hrs), Avg:97.6 ?F (36.4 ?C), Min:97.6 ?F (36.4 ?C), Max:97.6 ?F (36.4 ?C) ? ?Recent Labs  ?Lab 04/12/22 ?1334 04/13/22 ?0351  ?WBC 5.9 6.0  ?CREATININE 2.24* 2.00*  ?  ?Estimated Creatinine Clearance: 22.2 mL/min (A) (by C-G formula based on SCr of 2 mg/dL (H)).   ? ?Allergies  ?Allergen Reactions  ? Codeine Nausea And Vomiting  ? Percocet [Oxycodone-Acetaminophen] Nausea And Vomiting  ? ? ?Antimicrobials this admission: ?5/16 vancomycin x 1 ?5/16 Zosyn >>  ?5/17 doxycycline >>  ? ?Dose adjustments this admission: ?N/A ? ?Microbiology results: ?5/16 UCx: Pending   ? ? ?Thank you for allowing pharmacy to be a part of this patient?s care. ? ?Darnelle Bos, PharmD ?04/13/2022 8:52 AM ? ?

## 2022-04-13 NOTE — Progress Notes (Signed)
?PROGRESS NOTE ?[name]  ACZ:[id number] DOB: 1918-12-02 DOA: 04/12/2022 ?PCP: Susy Frizzle, MD  ? ?Brief Narrative/Hospital Course: ?88 year old male with hypothyroidism, congestive heart failure, CKD with baseline creatinine IV 1.96-2.19 hypertension persistent A-fib with pacer due to complete heart block, on Xarelto, wound on the right foot who lives at home by himself but family nearby brought to the ED by the daughter due to altered mental status.  Patient was seen at the wound care today and did not recognize and did not know the name of his daughters who were with him, this had happened in the past 24 hours, patient was also placed on Bactrim for the past 1 months to treat a bone infection in the right foot. ?In the ED was afebrile pleasantly disoriented.  Further work-up showed EKG with V paced, CT head no acute finding, chest x-ray cardiomegaly and pulmonary congestion, CT abdomen pelvis without contrast colonic diverticulosis, large hiatal hernia, prostatic enlargement bibasilar pulmonary infiltrates, aortic atherosclerosis, x-ray of the right foot distal fifth metatarsal head/neck osteomyelitis again noted compared to 03/15/2022 x-ray.  Lab work revealed AK, patient was admitted for further management.  Was nonfocal in the ED. work-up with CRP 0.7 procalcitonin less than 0.1 BNP 159, normal CBC, UA WBC/RBC both>50. In ED patient hypoxic S/P on room air placed on 1 to 2 L nasal cannula. ?  ? Subjective: ?Seen and examined this morning. ?Alert awake, confused/agitated trying to adjust in bed. ?Daughter at bedside ?  ?Assessment and Plan: ?Principal Problem: ?  Osteomyelitis ([place]) ?Active Problems: ?  Hypertension ?  Hypothyroidism ?  Acid reflux ?  CKD (chronic kidney disease), stage IV ([place]) ?  Acute respiratory failure with hypoxia ([place]) ?  Decreased pedal pulses ?  Acute kidney injury superimposed on chronic kidney disease ([place]) ? ?Osteomyelitis of right foot distal fifth metatarsal head and  neck: ?ongoing followed by podiatry outpatient.  Consult podiatry, follow-up ABI.  Holding Bactrim due to AKI.  Was on Bactrim x3 weeks.  Podiatry consulted, pharmacy consulted keep on IV antibiotics as ordered.  Ordered wound cultures. ? ?Acute metabolic encephalopathy remains confused agitated, discussed with daughter, hospice suspect multifactorial.  Starting low-dose Xanax, Seroquel bedtime but we discussed minimizing sedatives CT head CT abdomen unremarkable.  PT OT, fall precaution supportive care ? ?Acute respiratory failure with hypoxia: BNP fairly stable 159, chest x-ray/CT shows bibasilar pulmonary infiltrates- ?  Possible pneumonia,Procalcitonin is reassuring-empiric antibiotics,  incentive spirometry, supplemental oxygen, wean oxygen, encourage PT OT. ? ?Acute kidney injury superimposed on chronic kidney disease   ?CKD (chronic kidney disease), stage IV: ?Baseline creatinine since January 1.9-2, creatinine now improved after fluids.  Monitor ?Recent Labs  ?Lab 04/12/22 ?1334 04/13/22 ?0351  ?BUN 33* 30*  ?CREATININE 2.24* 2.00*  ?  ?Hypertension: BP stable, on amlodipine, aspirin ?Hypothyroidism: Continue home levothyroxine 1 one 2 mcg ?GERD:Continue PPI ? ?Pyuria received antibiotics in the ED, urine culture not sent, reordered urine culture from previous sample .  Continue empiric antibiotics with Zosyn for now. ? ?Goals of care: DNR patient w/ advanced age multiple medical Comorbidities.  Currently remains agitated encephalopathic, overall prognosis does not appear bright, discussed with daughter, palliative care consulted.  Patient is already DNR. ? ?DVT prophylaxis: heparin injection 5,000 Units Start: 04/12/22 2200 ?Code Status:   Code Status: DNR ?Family Communication: plan of care discussed with patient/patient's daughter at bedside. ?Patient status is: Inpatient because of ongoing management of multiple infection/acute encephalopathy ?Level of care: Telemetry Cardiac  ? ?Dispo: The  patient is  from: HOME ?           Anticipated disposition: tbd ? ?Mobility Assessment (last 72 hours)   ? ? Mobility Assessment   ? ? [place] Name 04/13/22 0800  ?  ?  ?  ?  ? Does patient have an order for bedrest or is patient medically unstable No - Continue assessment      ? What is the highest level of mobility based on the progressive mobility assessment? Level 2 ([place]) - Balance while sitting on edge of bed and cannot stand      ? ?  ?  ? ?  ?  ? ?Objective: ?Vitals last 24 hrs: ?Vitals:  ? 04/13/22 0630 04/13/22 0730 04/13/22 0830 04/13/22 0900  ?BP: 133/86 133/78 (!) 144/61 120/72  ?Pulse: 60  67 (!) 112  ?Resp: 19  16 (!) 22  ?Temp:      ?TempSrc:      ?SpO2: 97%  95% 95%  ?Weight:      ? ?Weight change:  ? ?Physical Examination: ?General exam: alert awake, oriented to self, frail, agitated confused ?HEENT:Oral mucosa moist, Ear/Nose WNL grossly, dentition normal. ?Respiratory system: bilaterally diminished BS, no use of accessory muscle ?Cardiovascular system: S1 & S2 +, No JVD. ?Gastrointestinal system: Abdomen soft,NT,ND, BS+ ?Nervous System:Alert, awake, moving extremities and grossly nonfocal ?Extremities: LE edema  mild both legs, chronic appearing skin changes , right foot with also see picture distal peripheral pulses palpable.  ?Skin: No rashes,no icterus. ?MSK: Normal muscle bulk,tone, power ? ? ?Medications reviewed:  ?Scheduled Meds: ? amLODipine  5 mg Oral Daily  ? aspirin EC  81 mg Oral Daily  ? heparin  5,000 Units Subcutaneous Q8H  ? levothyroxine  112 mcg Oral Daily  ? QUEtiapine  12.5 mg Oral QHS  ? sodium chloride flush  3 mL Intravenous Q12H  ? ?Continuous Infusions: ? sodium chloride 50 mL/hr at 04/13/22 1113  ? doxycycline (VIBRAMYCIN) IV Stopped (04/13/22 1108)  ? lactated ringers 10 mL/hr at 04/13/22 0342  ? piperacillin-tazobactam (ZOSYN)  IV 3.375 g (04/13/22 1116)  ? ?Diet Order   ? ?       ?  Diet NPO time specified Except for: Sips with Meds  Diet effective midnight       ?  ? ?  ?  ? ?   ?  ? ?  ?  ?  ? ? ?Intake/Output Summary (Last 24 hours) at 04/13/2022 1152 ?Last data filed at 04/13/2022 9323 ?Gross per 24 hour  ?Intake --  ?Output 900 ml  ?Net -900 ml  ? ?Net IO Since Admission: -900 mL [04/13/22 1152]  ?Wt Readings from Last 3 Encounters:  ?04/12/22 89.4 kg  ?03/03/22 89.4 kg  ?01/17/22 95.3 kg  ?  ? ?Unresulted Labs (From admission, onward)  ? ?  Start     Ordered  ? 04/14/22 5573  Basic metabolic panel  Tomorrow morning,   R       ? 04/13/22 0851  ? 04/14/22 0500  CBC  Tomorrow morning,   R       ? 04/13/22 0851  ? 04/13/22 0756  Urine Culture  Add-on,   AD       ?Question:  Indication  Answer:  Altered mental status (if no other cause identified)  ? 04/13/22 0755  ? 04/13/22 0755  Aerobic Culture w Gram Stain (superficial specimen)  Once,   R       ? 04/13/22 0755  ?  04/13/22 0500  Procalcitonin  Daily,   URGENT     ? May 04, 2022 1632  ? ?  ?  ? ?  ?Data Reviewed: I have personally reviewed following labs and imaging studies ?CBC: ?Recent Labs  ?Lab May 04, 2022 ?1334 04/13/22 ?0351  ?WBC 5.9 6.0  ?HGB 13.4 12.0*  ?HCT 42.8 37.3*  ?MCV 92.0 91.0  ?PLT 163 158  ? ?Basic Metabolic Panel: ?Recent Labs  ?Lab May 04, 2022 ?1334 04/13/22 ?0351  ?NA 136 135  ?K 4.9 5.0  ?CL 103 106  ?CO2 24 22  ?GLUCOSE 121* 108*  ?BUN 33* 30*  ?CREATININE 2.24* 2.00*  ?CALCIUM 9.7 9.0  ? ?GFR: ?Estimated Creatinine Clearance: 22.2 mL/min (A) (by C-G formula based on SCr of 2 mg/dL (H)). ?Liver Function Tests: ?Recent Labs  ?Lab 2022-05-04 ?1334 04/13/22 ?0351  ?AST 23 22  ?ALT 11 10  ?ALKPHOS 70 57  ?BILITOT 0.4 0.7  ?PROT 8.5* 7.3  ?ALBUMIN 4.6 3.8  ? ?No results for input(s): LIPASE, AMYLASE in the last 168 hours. ?No results for input(s): AMMONIA in the last 168 hours. ?Coagulation Profile: ?No results for input(s): INR, PROTIME in the last 168 hours. ?BNP (last 3 results) ?No results for input(s): PROBNP in the last 8760 hours. ?HbA1C: ?No results for input(s): HGBA1C in the last 72 hours. ?CBG: ?No results for input(s):  GLUCAP in the last 168 hours. ?Lipid Profile: ?No results for input(s): CHOL, HDL, LDLCALC, TRIG, CHOLHDL, LDLDIRECT in the last 72 hours. ?Thyroid Function Tests: ?No results for input(s): TSH, T4TOTAL, FR

## 2022-04-13 NOTE — ED Notes (Signed)
RN assumed care of pt. Pt family at bedside. Pt currently on male Riley. Pt resting in bed with eyes closed. Lights dimmed for comfort. Monitoring cords in place at this time.  ?

## 2022-04-13 NOTE — Assessment & Plan Note (Signed)
We will cont empiric abx . ?Mri and podiatry consult per am team.  ?  ? ?

## 2022-04-13 NOTE — Consult Note (Addendum)
WOC Nurse Consult Note: ?Reason for Consult: Consult requested for right foot. Performed remotely after review of progress notes and photo in the EMR.  Pt is followed by the outpatient wound care center and was seen there yesterday by Dr Joaquim Lai, according to progress notes.  He has a chronic full thickness wound to right outer foot and they have been using a collagen dressing. This topical treatment is not available in the [facility]; I will substitute Aquacel silver hydrofiber while he is in the hospital, since it is a similar product.  ?X-ray indicates: "There is unchanged cortical resorption along the LATERAL 5th metatarsal head/neck compatible with osteomyelitis.  This complex medical condition is beyond the scope of practice for [facility] nurses. ABI and MRI have been ordered and patient has been placed on systemic antibiotics. Progress notes from primary team indicate they plan to consult podiatry for further plan of care. ?Wound type: Wound is 85% red, 15% yellow ?Dressing procedure/placement/frequency: Topical treatment orders provided for bedside nurses to perform as follows:  Apply a piece of Aquacel Kellie Simmering # [id number]) to right foot wound Q day, then cover with ABD pad and kerlex.  Moisten with saline each time to assist with removal. ?Pt should resume follow-up at the outpatient wound care center after discharge.  ?Please re-consult if further assistance is needed.  Thank-you,  ?Julien Girt MSN, RN, [place], [place], CNS ?[phone]  ?

## 2022-04-13 NOTE — ED Notes (Signed)
This RN assumed care of this patient at this time - Pt is asleep in bed; respirations equal and unlabored; pt on the monitor. Daughter at the bedside.  ?

## 2022-04-13 NOTE — ED Notes (Signed)
RN at bedside. Pt continues to raise legs and arms in the air. Pt restless and continues to pull at monitoring cords and purewick. Unable to keep sheets on bed in place. Family remains at beside.  ?

## 2022-04-13 NOTE — Assessment & Plan Note (Signed)
Lab Results  ?Component Value Date  ? CREATININE 2.24 (H) 04/12/2022  ? CREATININE 1.96 (H) 12/28/2021  ? CREATININE 2.19 (H) 12/24/2021  ?We will monitor and avoid contrast. ?

## 2022-04-13 NOTE — ED Notes (Addendum)
RN and EDT at bedside. Pt slid down in bed and repeatedly asking to be sat up and then laid down. Pt still very restless in bed. Pt repositioned. Brief clean and dry and purewick remains in place. Despite reposition and efforts for low stimulation but remains very unsettled. Family at bedside.  ?

## 2022-04-13 NOTE — ED Notes (Addendum)
Pt back from [place]. RN at bedside. Pt with incomprehensible speech. Pt continues to be restless and [place] informed this RN only able to obtain some Bps. Pt placed back onto purewick, monitoring cords and IVF. Family at beside.  ?

## 2022-04-13 NOTE — Assessment & Plan Note (Signed)
Blood pressure (!) 119/54, pulse 61, temperature 97.6 ?F (36.4 ?C), temperature source Oral, resp. rate 14, weight 89.4 kg, SpO2 94 %. ?We will continue amlodipine and PRN and hydralazine.  ?Stop enalapril.  ?Stop amlodipine.  ? ?

## 2022-04-14 ENCOUNTER — Ambulatory Visit: Payer: Medicare Other | Admitting: Family Medicine

## 2022-04-14 ENCOUNTER — Telehealth: Payer: Self-pay

## 2022-04-14 ENCOUNTER — Inpatient Hospital Stay: Payer: Medicare Other

## 2022-04-14 DIAGNOSIS — R338 Other retention of urine: Secondary | ICD-10-CM

## 2022-04-14 DIAGNOSIS — Z7189 Other specified counseling: Secondary | ICD-10-CM

## 2022-04-14 DIAGNOSIS — G9341 Metabolic encephalopathy: Secondary | ICD-10-CM | POA: Diagnosis not present

## 2022-04-14 DIAGNOSIS — M86171 Other acute osteomyelitis, right ankle and foot: Secondary | ICD-10-CM | POA: Diagnosis not present

## 2022-04-14 DIAGNOSIS — J690 Pneumonitis due to inhalation of food and vomit: Secondary | ICD-10-CM | POA: Diagnosis not present

## 2022-04-14 DIAGNOSIS — J9601 Acute respiratory failure with hypoxia: Secondary | ICD-10-CM | POA: Diagnosis not present

## 2022-04-14 LAB — URINALYSIS, COMPLETE (UACMP) WITH MICROSCOPIC
Bilirubin Urine: NEGATIVE
Glucose, UA: NEGATIVE mg/dL
Ketones, ur: NEGATIVE mg/dL
Leukocytes,Ua: NEGATIVE
Nitrite: NEGATIVE
Protein, ur: NEGATIVE mg/dL
RBC / HPF: >50 RBC/hpf — ABNORMAL HIGH (ref 0–5)
Specific Gravity, Urine: 1.008 (ref 1.005–1.030)
pH: 5 (ref 5.0–8.0)

## 2022-04-14 LAB — CBC
HCT: 39.8 % (ref 39.0–52.0)
Hemoglobin: 12.7 g/dL — ABNORMAL LOW (ref 13.0–17.0)
MCH: 29.8 pg (ref 26.0–34.0)
MCHC: 31.9 g/dL (ref 30.0–36.0)
MCV: 93.4 fL (ref 80.0–100.0)
Platelets: 159 10*3/uL (ref 150–400)
RBC: 4.26 MIL/uL (ref 4.22–5.81)
RDW: 15.8 % — ABNORMAL HIGH (ref 11.5–15.5)
WBC: 13.2 10*3/uL — ABNORMAL HIGH (ref 4.0–10.5)
nRBC: 0 % (ref 0.0–0.2)

## 2022-04-14 LAB — BASIC METABOLIC PANEL
Anion gap: 13 (ref 5–15)
BUN: 30 mg/dL — ABNORMAL HIGH (ref 8–23)
CO2: 22 mmol/L (ref 22–32)
Calcium: 9.2 mg/dL (ref 8.9–10.3)
Chloride: 102 mmol/L (ref 98–111)
Creatinine, Ser: 2.14 mg/dL — ABNORMAL HIGH (ref 0.61–1.24)
GFR, Estimated: 27 mL/min — ABNORMAL LOW (ref >60)
Glucose, Bld: 113 mg/dL — ABNORMAL HIGH (ref 70–99)
Potassium: 4.5 mmol/L (ref 3.5–5.1)
Sodium: 137 mmol/L (ref 135–145)

## 2022-04-14 LAB — URINE CULTURE: Culture: <10000 — AB

## 2022-04-14 LAB — PROCALCITONIN: Procalcitonin: 0.14 ng/mL

## 2022-04-14 MED ORDER — QUETIAPINE FUMARATE 25 MG PO TABS
50.0000 mg | ORAL_TABLET | Freq: Every day | ORAL | Status: DC
  Administered 2022-04-14: 50 mg via ORAL
  Filled 2022-04-14: qty 2

## 2022-04-14 MED ORDER — FUROSEMIDE 10 MG/ML IJ SOLN
60.0000 mg | Freq: Once | INTRAMUSCULAR | Status: DC
Start: 2022-04-14 — End: 2022-04-14

## 2022-04-14 MED ORDER — SODIUM CHLORIDE 0.9 % IV SOLN
100.0000 mg | Freq: Two times a day (BID) | INTRAVENOUS | Status: DC
  Administered 2022-04-14 – 2022-04-16 (×5): 100 mg via INTRAVENOUS
  Filled 2022-04-14 (×6): qty 100

## 2022-04-14 MED ORDER — POLYETHYLENE GLYCOL 3350 17 G PO PACK
17.0000 g | PACK | Freq: Every day | ORAL | Status: DC
  Administered 2022-04-14 – 2022-04-18 (×4): 17 g via ORAL
  Filled 2022-04-14 (×6): qty 1

## 2022-04-14 MED ORDER — FUROSEMIDE 10 MG/ML IJ SOLN
40.0000 mg | Freq: Once | INTRAMUSCULAR | Status: AC
  Administered 2022-04-14: 40 mg via INTRAVENOUS
  Filled 2022-04-14: qty 4

## 2022-04-14 MED ORDER — MELATONIN 5 MG PO TABS
2.5000 mg | ORAL_TABLET | Freq: Every day | ORAL | Status: DC
  Administered 2022-04-14 – 2022-04-20 (×7): 2.5 mg via ORAL
  Filled 2022-04-14 (×7): qty 1

## 2022-04-14 MED ORDER — HYDROMORPHONE HCL 1 MG/ML IJ SOLN
0.5000 mg | Freq: Once | INTRAMUSCULAR | Status: AC
  Administered 2022-04-14: 0.5 mg via INTRAVENOUS
  Filled 2022-04-14: qty 1

## 2022-04-14 MED ORDER — QUETIAPINE FUMARATE 25 MG PO TABS
25.0000 mg | ORAL_TABLET | Freq: Once | ORAL | Status: AC
  Administered 2022-04-14: 25 mg via ORAL
  Filled 2022-04-14: qty 1

## 2022-04-14 MED ORDER — ALBUTEROL SULFATE (2.5 MG/3ML) 0.083% IN NEBU: 2.5000 mg | INHALATION_SOLUTION | RESPIRATORY_TRACT | Status: DC | PRN

## 2022-04-14 MED ORDER — CHLORHEXIDINE GLUCONATE 0.12 % MT SOLN
15.0000 mL | Freq: Two times a day (BID) | OROMUCOSAL | Status: DC
  Administered 2022-04-15 – 2022-04-21 (×12): 15 mL via OROMUCOSAL
  Filled 2022-04-14 (×13): qty 15

## 2022-04-14 MED ORDER — ORAL CARE MOUTH RINSE
15.0000 mL | Freq: Two times a day (BID) | OROMUCOSAL | Status: DC
  Administered 2022-04-15 – 2022-04-20 (×8): 15 mL via OROMUCOSAL

## 2022-04-14 MED ORDER — CHLORHEXIDINE GLUCONATE CLOTH 2 % EX PADS
6.0000 | MEDICATED_PAD | Freq: Every day | CUTANEOUS | Status: DC
  Administered 2022-04-14 – 2022-04-21 (×8): 6 via TOPICAL

## 2022-04-14 MED ORDER — IPRATROPIUM-ALBUTEROL 0.5-2.5 (3) MG/3ML IN SOLN
3.0000 mL | Freq: Three times a day (TID) | RESPIRATORY_TRACT | Status: DC
  Administered 2022-04-14 – 2022-04-16 (×6): 3 mL via RESPIRATORY_TRACT
  Filled 2022-04-14 (×6): qty 3

## 2022-04-14 MED ORDER — LACTATED RINGERS IV SOLN: INTRAVENOUS | Status: DC

## 2022-04-14 MED ORDER — GLYCOPYRROLATE 0.2 MG/ML IJ SOLN
0.2000 mg | Freq: Once | INTRAMUSCULAR | Status: AC
  Administered 2022-04-14: 0.2 mg via INTRAVENOUS
  Filled 2022-04-14: qty 1

## 2022-04-14 NOTE — Evaluation (Signed)
Physical Therapy Evaluation Patient Details Name: [name] MRN: [id number] DOB: 15-Jan-1919 Today's Date: 04/14/2022  History of Present Illness  88 year old male with hypothyroidism, congestive heart failure, CKD, hypertension persistent A-fib with pacer due to complete heart block, on Xarelto, wound on the right foot who is seen at wound care clinic.  He lives at home "alone" but family nearby and around most of the time.  Brought to the ED by daughter due to altered mental status.  Clinical Impression  Per nurse and family pt Is less confused than on arrival, but still far from his baseline regarding mental and physical status.  He apparently had been managing at home relatively well with occasional outings with family in the community and able to get around the home, use the bathroom, etc.  Today he needed direct assist with all mobility and was losing balance backward in sitting and with very labored and assisted standing effort.  Pt on 5L O2 t/o the session with labored breathing, though SpO2 readings, when monitor was working, appeared to stay in the 90s.     Recommendations for follow up therapy are one component of a multi-disciplinary discharge planning process, led by the attending physician.  Recommendations may be updated based on patient status, additional functional criteria and insurance authorization.  Follow Up Recommendations Skilled nursing-short term rehab (<3 hours/day)    Assistance Recommended at Discharge Frequent or constant Supervision/Assistance  Patient can return home with the following  Two people to help with walking and/or transfers;Two people to help with bathing/dressing/bathroom;Assistance with cooking/housework;Direct supervision/assist for medications management;Direct supervision/assist for financial management;Help with stairs or ramp for entrance;Assist for transportation    Equipment Recommendations  (TBD at rehab)  Recommendations for Other  Services       Functional Status Assessment Patient has had a recent decline in their functional status and demonstrates the ability to make significant improvements in function in a reasonable and predictable amount of time.     Precautions / Restrictions Precautions Precautions: Fall Restrictions Weight Bearing Restrictions: Yes RLE Weight Bearing: Weight bearing as tolerated (per podiatry)      Mobility  Bed Mobility Overal bed mobility: Needs Assistance Bed Mobility: Supine to Sit, Sit to Supine     Supine to sit: Mod assist Sit to supine: Mod assist   General bed mobility comments: Pt intermittently able to give more and less purposeful assist but ultimately needed considerable assist to get to    Transfers Overall transfer level: Needs assistance Equipment used: Rolling walker (2 wheels) Transfers: Sit to/from Stand Sit to Stand: Max assist, +2 safety/equipment, From elevated surface           General transfer comment: Pt with limited ability to follow cues, but willing/eager to try standing.  Ultimately needed heavy assist with the transition to standing, maintained retropulsion t/o the effort and struggled to really shift weight to the UEs/walker.  Direct assist the entire time to insure she did not lose balance backwards.    Ambulation/Gait               General Gait Details: unable, tried to do a few small side shuffles steps along EOB with no real weight shift  Stairs            Wheelchair Mobility    Modified Rankin (Stroke Patients Only)       Balance Overall balance assessment: Needs assistance Sitting-balance support: Bilateral upper extremity supported Sitting balance-Leahy Scale: Poor Sitting balance - Comments: Generally needing  at least light assist to keep from falling back, but did manage to maintain static sitting balance w/o direct assist from time to time     Standing balance-Leahy Scale: Zero Standing balance comment:  retropulsion t/o entirety of ambulation, direct assist to maintain upright (At times max assist)                             Pertinent Vitals/Pain Pain Assessment Pain Assessment: Faces Faces Pain Scale: Hurts a little bit Pain Location: vague hesitation with movement, but did not indicate focal pain    Home Living Family/patient expects to be discharged to:: Private residence Living Arrangements: Alone Available Help at Discharge: Available 24 hours/day;Family   Home Access: Ramped entrance       Home Layout: One level Home Equipment: Rollator (4 wheels);Rolling Walker (2 wheels)      Prior Function Prior Level of Function : Independent/Modified Independent (family reports they have had to be around and helping more and more recently)             Mobility Comments: out of the home occasionally, but able to mobilize with walker in the home apparently quite well ADLs Comments: family helps with meals, etc but he typically goes to the bathroom, dresses, etc w/o needing direct assist     Hand Dominance        Extremity/Trunk Assessment   Upper Extremity Assessment Upper Extremity Assessment: Generalized weakness    Lower Extremity Assessment Lower Extremity Assessment: Generalized weakness       Communication   Communication:  (pt somewhat difficult to understand with slurred speech)  Cognition Arousal/Alertness: Awake/alert Behavior During Therapy: Restless, Impulsive Overall Cognitive Status: Impaired/Different from baseline                                 General Comments: Pt oriented to self, but did not know date, location, situation        General Comments General comments (skin integrity, edema, etc.): pt on 5L t/o session, difficult getting consistent O2 readings but SpO2s appear to be appropriate, pt with labored shallow breathing t/o the session.    Exercises     Assessment/Plan    PT Assessment Patient needs  continued PT services  PT Problem List Decreased strength;Decreased range of motion;Decreased activity tolerance;Decreased balance;Decreased mobility;Decreased knowledge of use of DME;Decreased safety awareness;Decreased cognition;Decreased coordination;Cardiopulmonary status limiting activity       PT Treatment Interventions Gait training;Stair training;Functional mobility training;Therapeutic activities;Therapeutic exercise;Balance training;Patient/family education    PT Goals (Current goals can be found in the Care Plan section)  Acute Rehab PT Goals Patient Stated Goal: pt uanble to state, daughter hoping to get stronger at rehab and go home PT Goal Formulation: With patient Time For Goal Achievement: 04/28/22 Potential to Achieve Goals: Fair    Frequency Min 2X/week     Co-evaluation PT/OT/SLP Co-Evaluation/Treatment: Yes Reason for Co-Treatment: Complexity of the patient's impairments (multi-system involvement);Necessary to address cognition/behavior during functional activity;For patient/therapist safety;To address functional/ADL transfers PT goals addressed during session: Mobility/safety with mobility;Strengthening/ROM;Balance;Proper use of DME         AM-PAC PT "6 Clicks" Mobility  Outcome Measure Help needed turning from your back to your side while in a flat bed without using bedrails?: A Lot Help needed moving from lying on your back to sitting on the side of a flat bed without using bedrails?:  A Lot Help needed moving to and from a bed to a chair (including a wheelchair)?: A Lot Help needed standing up from a chair using your arms (e.g., wheelchair or bedside chair)?: A Lot Help needed to walk in hospital room?: Total Help needed climbing 3-5 steps with a railing? : Total 6 Click Score: 10    End of Session Equipment Utilized During Treatment: Gait belt;Oxygen (5L) Activity Tolerance: Patient limited by fatigue Patient left: in bed;with call bell/phone within  reach;with nursing/sitter in room;with family/visitor present Nurse Communication: Mobility status PT Visit Diagnosis: Muscle weakness (generalized) (M62.81);Difficulty in walking, not elsewhere classified (R26.2)    Time: 1345-1410 PT Time Calculation (min) (ACUTE ONLY): 25 min   Charges:   PT Evaluation $PT Eval Low Complexity: 1 Low PT Treatments $Therapeutic Activity: 8-22 mins        Kreg Shropshire, DPT 04/14/2022, 3:32 PM

## 2022-04-14 NOTE — Progress Notes (Signed)
At bedside to star PIV per consult.  Current site bloody and wrapped with Kerlix, infusing at 125 ml/hr without difficulty and without leaking at the site. 2 dtrs and NP at bedside, all in agreement to change PIV dressing when pt calmer and not start a PIV at this time.  [place] RN notified of family request.

## 2022-04-14 NOTE — Progress Notes (Signed)
Daily Progress Note   Patient Name: [name]       Date: 04/14/2022 DOB: 10-24-1919  Age: 88 y.o. MRN#: [id number] Attending Physician: Sharen Hones, MD Primary Care Physician: Susy Frizzle, MD Admit Date: 04/12/2022  Reason for Consultation/Follow-up: Establishing goals of care  Subjective: Notes and labs reviewed.  Spoke with attending MD in detail regarding his thoughts and concern for aspiration pneumonia.  In to see patient.  Patient appears restless and agitated.  Both his daughters [name] and Vaughan Basta are at bedside.  We discussed his status.  [name] tells me she was caught by surprise with my call and conversation yesterday, as her father was only admitted 1 day prior. We discussed the conversation Vaughan Basta and I had prior to [name] calling her on her phone for [place] to talk.  Vaughan Basta discusses that she probably should have primed Becky for the phone call and information.   They tell me that [name] is a man of strong faith, and has been making statements for a while that he is ready to go.  They tell me he was very comfortable with going to see people prior to death and praying with them.  Daughters discuss God's sovereignty.  We discussed care moving forward.  We discussed pain and suffering.  Question of what patient's wishes would be.  Question of what would be considered an acceptable quality of life. They state they are unsure of his wishes, and will need to talk as a family.  Discussed time for outcomes.   Length of Stay: 2  Current Medications: Scheduled Meds:   amLODipine  5 mg Oral Daily   aspirin EC  81 mg Oral Daily   heparin  5,000 Units Subcutaneous Q8H   ipratropium-albuterol  3 mL Nebulization TID   levothyroxine  112 mcg Oral Daily   melatonin  2.5 mg Oral QHS    QUEtiapine  25 mg Oral Once   QUEtiapine  50 mg Oral QHS   sodium chloride flush  3 mL Intravenous Q12H    Continuous Infusions:  doxycycline (VIBRAMYCIN) IV     lactated ringers 10 mL/hr at 04/13/22 0342   lactated ringers     piperacillin-tazobactam (ZOSYN)  IV 3.375 g (04/14/22 1258)    PRN Meds: albuterol, haloperidol lactate, hydrALAZINE, LORazepam  Physical Exam Pulmonary:  Effort: Pulmonary effort is normal.  Neurological:     Mental Status: He is alert.            Vital Signs: BP (!) 167/77 (BP Location: Right Leg)   Pulse 71   Temp 98 F (36.7 C)   Resp 19   Wt 94.3 kg   SpO2 96%   BMI 27.43 kg/m  SpO2: SpO2: 96 % O2 Device: O2 Device: Nasal Cannula O2 Flow Rate: O2 Flow Rate (L/min): 4 L/min  Intake/output summary:  Intake/Output Summary (Last 24 hours) at 04/14/2022 1358 Last data filed at 04/14/2022 1258 Gross per 24 hour  Intake 250 ml  Output 2275 ml  Net -2025 ml   LBM: Last BM Date : 04/10/22 Baseline Weight: Weight: 89.4 kg (02/2022 weight) Most recent weight: Weight: 94.3 kg   Patient Active Problem List   Diagnosis Date Noted   Acute metabolic encephalopathy 29/92/4268   Aspiration pneumonia of both lower lobes ([place]) 04/14/2022   Acute urinary retention 04/14/2022   Acute respiratory failure with hypoxia (HCC) 04/13/2022   Decreased pedal pulses 04/13/2022   Acute kidney injury superimposed on chronic kidney disease (HCC) 04/13/2022   Osteomyelitis ([place]) 04/12/2022   Leg swelling 12/02/2020   Heart block 05/27/2020   Fall 05/27/2020   Heart block AV complete ([place]) 05/27/2020   Hypothyroidism 11/07/2018   CKD (chronic kidney disease), stage IV ([place]) 11/07/2018   Abnormal liver function 11/07/2018   NHL (non-Hodgkin's lymphoma) ([place]) 11/07/2018   Venous stasis dermatitis 05/28/2015   Insomnia 05/28/2015   Cancer ([place]) 09/05/2013   Lymphadenopathy, cervical 08/29/2013   Acid reflux    Hypertension    Peripheral vascular disease  ([place])     Palliative Care Assessment & Plan   Recommendations/Plan:  Family would like time for outcomes   Code Status:    Code Status Orders  (From admission, onward)           Start     Ordered   04/12/22 1838  Do not attempt resuscitation (DNR)  Continuous       Question Answer Comment  In the event of cardiac or respiratory ARREST Do not call a "code blue"   In the event of cardiac or respiratory ARREST Do not perform Intubation, CPR, defibrillation or ACLS   In the event of cardiac or respiratory ARREST Use medication by any route, position, wound care, and other measures to relive pain and suffering. May use oxygen, suction and manual treatment of airway obstruction as needed for comfort.      04/12/22 1838           Code Status History     Date Active Date Inactive Code Status Order ID Comments User Context   05/27/2020 2222 05/29/2020 1831 DNR [id number]  Dion Body, MD Inpatient   11/07/2018 0237 11/13/2018 1448 DNR [id number]  Ivor Costa, MD ED   01/18/2017 0248 01/20/2017 1745 DNR [id number]  Karmen Bongo, MD Inpatient       Prognosis: Very poor   Care plan was discussed with primary MD, and nurse  Thank you for allowing the Palliative Medicine Team to assist in the care of this patient. ent and plan.  Asencion Gowda, NP  Please contact Palliative Medicine Team phone at [phone] for questions and concerns.

## 2022-04-14 NOTE — Evaluation (Signed)
Occupational Therapy Evaluation Patient Details Name: [name] MRN: [id number] DOB: 1919-05-16 Today's Date: 04/14/2022   History of Present Illness 88 year old male with hypothyroidism, congestive heart failure, CKD, hypertension persistent A-fib with pacer due to complete heart block, on Xarelto, wound on the right foot who is seen at wound care clinic.  He lives at home "alone" but family nearby and around most of the time.  Brought to the ED by daughter due to altered mental status.   Clinical Impression   Chart reviewed to date, RN cleared pt for participation in OT evaluation. Co tx completed with PT. PTA pt had assist from family 24/7, as needed but was able to perform ADL with supervision-MOD I; family assisted with IADLs. Pt amb with a RW household distances. At this time pt is oriented to self only, presents with poor awareness, direction following, processing, initiation. MOD A required for bed mobility, MAX A +2 for STS with RW. MAX A required for UB dressing. Recommend STR at this time. Pt is left in bed in care of RN, NAD, all needs met. OT will follow acutely.      Recommendations for follow up therapy are one component of a multi-disciplinary discharge planning process, led by the attending physician.  Recommendations may be updated based on patient status, additional functional criteria and insurance authorization.   Follow Up Recommendations  Skilled nursing-short term rehab (<3 hours/day)    Assistance Recommended at Discharge Frequent or constant Supervision/Assistance  Patient can return home with the following Two people to help with walking and/or transfers;Two people to help with bathing/dressing/bathroom;Direct supervision/assist for medications management;Help with stairs or ramp for entrance;Assist for transportation;Assistance with feeding;Direct supervision/assist for financial management;Assistance with cooking/housework    Functional Status Assessment   Patient has had a recent decline in their functional status and demonstrates the ability to make significant improvements in function in a reasonable and predictable amount of time.  Equipment Recommendations  Other (comment) (per next venue of care)    Recommendations for Other Services       Precautions / Restrictions Precautions Precautions: Fall Restrictions Weight Bearing Restrictions: Yes RLE Weight Bearing: Weight bearing as tolerated      Mobility Bed Mobility Overal bed mobility: Needs Assistance Bed Mobility: Supine to Sit, Sit to Supine     Supine to sit: Mod assist, HOB elevated Sit to supine: Mod assist, HOB elevated        Transfers Overall transfer level: Needs assistance Equipment used: Rolling walker (2 wheels) Transfers: Sit to/from Stand Sit to Stand: Max assist, +2 safety/equipment, From elevated surface           General transfer comment: pt with posterior lean, step by step vcs required throughout      Balance Overall balance assessment: Needs assistance Sitting-balance support: Bilateral upper extremity supported, Feet supported Sitting balance-Leahy Scale: Poor Sitting balance - Comments: posteior lean noted, varied from MAX A-CGA required for upright sitting Postural control: Posterior lean Standing balance support: Bilateral upper extremity supported, During functional activity, Reliant on assistive device for balance Standing balance-Leahy Scale: Zero                             ADL either performed or assessed with clinical judgement   ADL Overall ADL's : Needs assistance/impaired                 Upper Body Dressing : Maximal assistance;Sitting   Lower Body Dressing:  Maximal assistance       Toileting- Clothing Manipulation and Hygiene: Total assistance         General ADL Comments: step by step vcs required throughout     Vision   Additional Comments: will continue to assess     Perception      Praxis      Pertinent Vitals/Pain Pain Assessment Pain Assessment: Faces Faces Pain Scale: Hurts a little bit Pain Location: R foot Pain Descriptors / Indicators: Guarding Pain Intervention(s): Monitored during session     Hand Dominance     Extremity/Trunk Assessment Upper Extremity Assessment Upper Extremity Assessment: Generalized weakness   Lower Extremity Assessment Lower Extremity Assessment: Generalized weakness       Communication Communication Communication:  (pt somewhat difficult to understand with slurred speech)   Cognition Arousal/Alertness: Awake/alert Behavior During Therapy: Restless, Impulsive Overall Cognitive Status: Impaired/Different from baseline Area of Impairment: Orientation, Attention, Memory, Following commands, Awareness, Problem solving, Safety/judgement                 Orientation Level: Disoriented to, Place, Time, Situation ("I'm in the woods") Current Attention Level: Focused Memory: Decreased recall of precautions, Decreased short-term memory Following Commands: Follows one step commands inconsistently Safety/Judgement: Decreased awareness of safety, Decreased awareness of deficits Awareness: Intellectual Problem Solving: Slow processing, Decreased initiation, Difficulty sequencing, Requires verbal cues, Requires tactile cues       General Comments  spo2 appears above 90% on 5L via [place] throughout    Exercises     Shoulder Instructions      Home Living Family/patient expects to be discharged to:: Private residence Living Arrangements: Alone Available Help at Discharge: Available 24 hours/day;Family Type of Home: House Home Access: Ramped entrance     Home Layout: One level     Bathroom Shower/Tub: Walk-in shower (with threshold step)   Bathroom Toilet: Handicapped height Bathroom Accessibility: Yes   Home Equipment: Rollator (4 wheels);Rolling Walker (2 wheels);Shower seat          Prior  Functioning/Environment Prior Level of Function : Independent/Modified Independent;Needs assist       Physical Assist : ADLs (physical)   ADLs (physical): IADLs Mobility Comments: amb household distances with RW with MOD I, limited community mobility per family report ADLs Comments: Family assists with cooking, cleaning, laundry; pt performs ADL with supervision-MOD I at baseline        OT Problem List: Decreased strength;Decreased activity tolerance;Decreased knowledge of use of DME or AE;Cardiopulmonary status limiting activity;Decreased cognition;Impaired balance (sitting and/or standing);Decreased safety awareness      OT Treatment/Interventions: Self-care/ADL training;Therapeutic exercise;Patient/family education;Balance training;DME and/or AE instruction;Energy conservation;Cognitive remediation/compensation;Therapeutic activities    OT Goals(Current goals can be found in the care plan section) Acute Rehab OT Goals Patient Stated Goal: rehab pending goals of care OT Goal Formulation: With family Time For Goal Achievement: 04/28/22 Potential to Achieve Goals: Fair ADL Goals Pt Will Perform Grooming: with supervision;sitting;standing Pt Will Perform Upper Body Dressing: with supervision;sitting Pt Will Perform Lower Body Dressing: with supervision;sit to/from stand Pt Will Transfer to Toilet: with supervision;stand pivot transfer;bedside commode  OT Frequency: Min 2X/week    Co-evaluation PT/OT/SLP Co-Evaluation/Treatment: Yes Reason for Co-Treatment: For patient/therapist safety;Necessary to address cognition/behavior during functional activity;To address functional/ADL transfers PT goals addressed during session: Mobility/safety with mobility;Strengthening/ROM;Balance;Proper use of DME OT goals addressed during session: ADL's and self-care      AM-PAC OT "6 Clicks" Daily Activity     Outcome Measure Help from another person eating meals?: A  Lot Help from another person  taking care of personal grooming?: A Lot Help from another person toileting, which includes using toliet, bedpan, or urinal?: Total Help from another person bathing (including washing, rinsing, drying)?: A Lot Help from another person to put on and taking off regular upper body clothing?: A Lot Help from another person to put on and taking off regular lower body clothing?: Total 6 Click Score: 10   End of Session Equipment Utilized During Treatment: Gait belt;Rolling walker (2 wheels);Oxygen Nurse Communication: Mobility status  Activity Tolerance: Other (comment) (treatment limited by cognition) Patient left: in bed;with call bell/phone within reach;with bed alarm set;with nursing/sitter in room;with family/visitor present  OT Visit Diagnosis: Unsteadiness on feet (R26.81);Muscle weakness (generalized) (M62.81)                Time: 5488-4573 OT Time Calculation (min): 21 min Charges:  OT General Charges $OT Visit: 1 Visit OT Evaluation $OT Eval Moderate Complexity: 1 Mod  Shanon Payor, OTD OTR/L  04/14/22, 4:07 PM

## 2022-04-14 NOTE — Telephone Encounter (Signed)
Spoke Music therapist at [facility]: per [name], requesting for medical dis Dx re pt's PT referral for [facility] Training/balance. Per pt [name] the Dx on the referral will not work.   Resend updated referral w/new DX (R09.89-decreased pedal pulse and I73.9-peripheral vascular) 04/14/22  Send msg to Vaughan Basta w/referral-with updated dx and advice for Vaughan Basta to resend referral with updated information to [facility]

## 2022-04-14 NOTE — Progress Notes (Signed)
   04/14/22 1500  Clinical Encounter Type  Visited With Patient and family together  Visit Type Initial  Referral From Palliative care team  Spiritual Encounters  Spiritual Needs Prayer   Chaplain provided assistance to family as they walk through difficult decisions and transition.

## 2022-04-14 NOTE — Consult Note (Signed)
Consultation Note Date: 04/14/2022   Patient Name: [name]  DOB: 07-15-1919  MRN: [id number]  Age / Sex: 88 y.o., male  PCP: Susy Frizzle, MD Referring Physician: Sharen Hones, MD  Reason for Consultation: Establishing goals of care  HPI/Patient Profile: [name] is a 88 y.o. male with medical history significant  of Htn and pacemaker for CHB and atrial fibrillation not a candidate for [facility]. Pt sees cardiology an last visit of 4/6 james allred.  Pt has a wound MD Dr.Pickard and was seen  By provider and started on bactrim.    Clinical Assessment and Goals of Care: Notes and labs reviewed. In to bedside. Patient is extremely restless and agitated and youngest daughter is at bedside.   She states prior to a week ago, he was "sharp as a tack". She states the mental status changes happened very abruptly. She discusses his previous lower extremity wounds and tells me that it feels like they have been able to get one problem fixed and something else would happen. She states though he remained cognitively "sharp as a tack", he physically has ben declining. She states the children take turns staying overnight with him, and coming during the day to check on him. She states there are 4 children, but because of frailty, the younger 2 children are the primary assistance. She tells me that patient is widowed, and about the experience with their mother. She states they had palliative that then turned to hospice. She discusses their father's pain and suffering. She states patient has been clear for quite a while he is ready to go. She discusses that her siblings are on the same page, and that their older sister is the HPOA.   She called her on speaker phone. I stepped out of room as patient was restless and yelling. Discussed status. She discusses his rapid mental status change and his ongoing physical weakness.  Discussed conversation with sister at bedside. All questions answered, and explained agressive path vs hospice path as requested by daughter at bedside. She tells me she has been advised patient has a UTI in addition to the osteo. She also states their brother Marcello Moores has been unaware of any conversations on comfort focused care and will need to weigh in. She discussed hospital visiting hours, and we discussed that he can come when able to assess his father to help make decisions.  Discussed that he is hospice appropriate if they choose. Discussed that I would follow up tomorrow.      SUMMARY OF RECOMMENDATIONS    PMT to follow.    Prognosis:  Poor       Primary Diagnoses: Present on Admission:  Osteomyelitis ([place])  Acid reflux  Hypertension  Hypothyroidism  CKD (chronic kidney disease), stage IV (HCC)  Acute respiratory failure with hypoxia (HCC)  Decreased pedal pulses  Acute kidney injury superimposed on chronic kidney disease ([place])   I have reviewed the medical record, interviewed the patient and family, and examined the patient. The following aspects  are pertinent.  Past Medical History:  Diagnosis Date   Acid reflux    Cancer ([place]) 09/05/13   left neck-non-hodgkins lymphoma   CKD (chronic kidney disease), stage IV ([place]) 11/07/2018   Hypertension    requires diuretic   Hypothyroidism    Malaria    while in the service   Peripheral vascular disease ([place])    slow wound healing on legs   Social History   Socioeconomic History   Marital status: Widowed    Spouse name: Not on file   Number of children: 4   Years of education: Not on file   Highest education level: Not on file  Occupational History   Occupation: retired  Tobacco Use   Smoking status: Former    Types: Cigars   Smokeless tobacco: Former    Quit date: 10/05/1979  Substance and Sexual Activity   Alcohol use: No   Drug use: No   Sexual activity: Not Currently  Other Topics Concern   Not on file   Social History Narrative   Children all live nearby and rotate staying with patient at night.    Wife died in 02/24/20, dementia, age 61.   Social Determinants of Health   Financial Resource Strain: Low Risk    Difficulty of Paying Living Expenses: Not hard at all  Food Insecurity: No Food Insecurity   Worried About Charity fundraiser in the Last Year: Never true   [place] in the Last Year: Never true  Transportation Needs: No Transportation Needs   Lack of Transportation (Medical): No   Lack of Transportation (Non-Medical): No  Physical Activity: Insufficiently Active   Days of Exercise per Week: 3 days   Minutes of Exercise per Session: 20 min  Stress: No Stress Concern Present   Feeling of Stress : Not at all  Social Connections: Socially Isolated   Frequency of Communication with Friends and Family: More than three times a week   Frequency of Social Gatherings with Friends and Family: More than three times a week   Attends Religious Services: Never   Marine scientist or Organizations: No   Attends Archivist Meetings: Never   Marital Status: Widowed   Family History  Problem Relation Age of Onset   Lung cancer Brother    Heart disease Brother    Stroke Brother    Scheduled Meds:  amLODipine  5 mg Oral Daily   aspirin EC  81 mg Oral Daily   heparin  5,000 Units Subcutaneous Q8H   ipratropium-albuterol  3 mL Nebulization Q6H   levothyroxine  112 mcg Oral Daily   QUEtiapine  12.5 mg Oral QHS   sodium chloride flush  3 mL Intravenous Q12H   Continuous Infusions:  doxycycline (VIBRAMYCIN) IV 100 mg (04/13/22 2324)   lactated ringers 10 mL/hr at 04/13/22 0342   piperacillin-tazobactam (ZOSYN)  IV 3.375 g (04/14/22 0224)   PRN Meds:.haloperidol lactate, hydrALAZINE, LORazepam Medications Prior to Admission:  Prior to Admission medications   Medication Sig Start Date End Date Taking? Authorizing Provider  aspirin EC 81 MG tablet Take 81 mg by  mouth daily. Swallow whole.   Yes [provider]  betamethasone dipropionate 0.05 % cream Apply topically 2 (two) times daily. Apply small amount to red areas on legs twice daily. 12/04/20  Yes Eulogio Bear, NP  furosemide (LASIX) 20 MG tablet TAKE 2 TABLETS BY MOUTH  DAILY 10/07/20  Yes Susy Frizzle, MD  levothyroxine (  SYNTHROID) 112 MCG tablet TAKE 1 TABLET BY MOUTH  DAILY 06/15/21  Yes Susy Frizzle, MD  mometasone (ELOCON) 0.1 % cream USE AS DIRECTED 12/10/21  Yes Susy Frizzle, MD  pregabalin (LYRICA) 25 MG capsule TAKE 1 CAPSULE BY MOUTH IN  THE MORNING AND 2 CAPSULES  IN THE EVENING 12/24/21  Yes Susy Frizzle, MD  silver sulfADIAZINE (SILVADENE) 1 % cream Apply 1 application topically daily. 01/17/22  Yes Susy Frizzle, MD  sulfamethoxazole-trimethoprim (BACTRIM) 400-80 MG tablet Take 1 tablet by mouth 2 (two) times daily.   Yes [provider]  triamcinolone (KENALOG) 0.1 % Apply 1 application topically 3 (three) times daily. 02/26/21  Yes Susy Frizzle, MD  vitamin B-12 (CYANOCOBALAMIN) 1000 MCG tablet Take 1 tablet (1,000 mcg total) by mouth daily. 05/01/15  Yes Susy Frizzle, MD  amLODipine (NORVASC) 5 MG tablet TAKE 1 TABLET BY MOUTH  DAILY Patient not taking: Reported on 03/03/2022 06/15/21   Susy Frizzle, MD  cephALEXin (KEFLEX) 500 MG capsule Take 1 capsule (500 mg total) by mouth 3 (three) times daily. Patient not taking: Reported on 03/03/2022 02/24/22   Susy Frizzle, MD  diphenhydramine-acetaminophen (TYLENOL PM) 25-500 MG TABS tablet Take 1 tablet by mouth at bedtime as needed.    [provider]  enalapril (VASOTEC) 20 MG tablet TAKE 1 TABLET BY MOUTH  DAILY Patient not taking: Reported on 03/03/2022 08/31/21   Susy Frizzle, MD  escitalopram (LEXAPRO) 10 MG tablet TAKE 1 TABLET BY MOUTH EVERY DAY Patient not taking: Reported on 03/03/2022 06/11/20   Annie Main, FNP  NONFORMULARY OR COMPOUNDED ITEM See pharmacy  note Patient taking differently: as needed. Compound Mixture for feet 07/29/20   Felipa Furnace, DPM  Rivaroxaban (XARELTO) 15 MG TABS tablet Take 1 tablet (15 mg total) by mouth daily with supper. Patient not taking: Reported on 03/03/2022 07/07/21   Shirley Friar, PA-C   Allergies  Allergen Reactions   Codeine Nausea And Vomiting   Percocet [Oxycodone-Acetaminophen] Nausea And Vomiting   Review of Systems  Constitutional:        Overall restless.    Physical Exam Neurological:     Mental Status: He is alert.    Vital Signs: BP (!) 149/50 (BP Location: Right Leg)   Pulse 67   Temp 97.6 F (36.4 C) (Axillary)   Resp 17   Wt 94.3 kg   SpO2 97%   BMI 27.43 kg/m  Pain Scale: PAINAD POSS *See Group Information*: 1-Acceptable,Awake and alert Pain Score: Asleep   SpO2: SpO2: 97 % O2 Device:SpO2: 97 % O2 Flow Rate: .O2 Flow Rate (L/min): 4 L/min  IO: Intake/output summary:  Intake/Output Summary (Last 24 hours) at 04/14/2022 0925 Last data filed at 04/14/2022 0800 Gross per 24 hour  Intake --  Output 2275 ml  Net -2275 ml    LBM: Last BM Date : 04/10/22 Baseline Weight: Weight: 89.4 kg (02/2022 weight) Most recent weight: Weight: 94.3 kg       Signed by: Asencion Gowda, NP   Please contact Palliative Medicine Team phone at [phone] for questions and concerns.  For individual provider: See Shea Evans

## 2022-04-14 NOTE — Progress Notes (Signed)
Initial Nutrition Assessment  DOCUMENTATION CODES:   Not applicable  INTERVENTION:   -RD will follow for diet advancement and add supplements as appropriate  NUTRITION DIAGNOSIS:   Inadequate oral intake related to inability to eat as evidenced by NPO status.  GOAL:   Patient will meet greater than or equal to 90% of their needs  MONITOR:   Diet advancement  REASON FOR ASSESSMENT:   Consult Wound healing  ASSESSMENT:   Pt with medical history significant  of Htn and pacemaker for CHB and atrial fibrillation not a candidate for [facility] admitted for osteomeylitis.  Pt admitted with osteomyelitis.   Reviewed I/O's: -1.4 L x 24 hours and -2.3 L since admission  UOP: 1.4 L x 24 hours  Pt with encephalopathy and confusion. He is not alert enough to take PO's.   Per [facility] note, pt with chronic full thickness wound to rt foot with osteomyelitis to 5th toe.   Palliative care following for goals of care discussions. Pt family leaning toward comfort care, but still need to discuss with other family members.   Reviewed wt hx; pt has experienced a 9.6% wt loss over the past 7 months, which is not significant for time frame.   Medications reviewed.   Labs reviewed.   Diet Order:   Diet Order             Diet NPO time specified Except for: Sips with Meds  Diet effective midnight                   EDUCATION NEEDS:   No education needs have been identified at this time  Skin:  Skin Assessment: Skin Integrity Issues: Skin Integrity Issues:: Other (Comment) Other: chronic full thickness wound to right outer foot  Last BM:  04/10/22  Height:   Ht Readings from Last 1 Encounters:  03/03/22 '6\' 1"'$  (1.854 m)    Weight:   Wt Readings from Last 1 Encounters:  04/14/22 94.3 kg    Ideal Body Weight:  83.6 kg  BMI:  Body mass index is 27.43 kg/m.  Estimated Nutritional Needs:   Kcal:  2050-2250  Protein:  105-120 grams  Fluid:  > 2 L    Loistine Chance, RD,  LDN, [place] Registered Dietitian II Certified Diabetes Care and Education Specialist Please refer to [facility] for RD and/or RD on-call/weekend/after hours pager

## 2022-04-14 NOTE — Progress Notes (Signed)
Progress Note   Patient: [name] YNW:[id number] DOB: February 06, 1919 DOA: 04/12/2022     2 DOS: the patient was seen and examined on 04/14/2022   Brief hospital course: 88 year old male with hypothyroidism, congestive heart failure, CKD with baseline creatinine IV 1.96-2.19 hypertension persistent A-fib with pacer due to complete heart block, on Xarelto, wound on the right foot who lives at home by himself but family nearby brought to the ED by the daughter due to altered mental status.   Assessment and Plan: Acute metabolic encephalopathy. Sleep deprivation. Had a long discussion with the family, patient mental status deteriorated over the past week, patient has not been able to sleep at nighttime, is sleeping most time in the daytime.  His sleeping cycle is reversed. Per podiatry, patient foot chronic osteomyelitis is probably not the source of deterioration. At this point, I will start Seroquel at 50 mg every evening in addition to melatonin 3 mg every evening.  Hopefully his sleep pattern can be improved.  Acute hypoxemic respiratory failure. Bilateral aspiration pneumonia. I reviewed the patient chest x-ray, patient has bilateral infiltrates, consistent with aspiration pneumonia, on a minimal elevation of procalcitonin level. Aspiration is due to altered mental status, not because of dysphagia. Acute hypoxemic respite failure was secondary to aspiration pneumonia.  Patient BNP is minimally elevated with chronic kidney disease disease stage IV, no evidence of acute on chronic congestive heart failure. Patient currently on Zosyn, will continue. Continue oxygen treatment for now.  Acute kidney injury superimposed on chronic kidney disease stage IV. Continue hold diuretics as patient does not have any volume overload.  Foot ulcer with chronic osteomyelitis of right foot distal fifth metatarsal head and neck. This is probably not the source of metabolic encephalopathy. Continue to  follow.  Continue antibiotics with doxycycline and Zosyn.  Will transition to oral antibiotics at time of discharge.  Urinary retention due to benign prostate hypertrophy Constipation. Patient has been requiring in and out catheter for the last 2 days.  Residual this morning was 900 mL.  Patient has a severe urinary tension.  Patient also has significant constipation.  Will anchor Foley catheter.  We will start stool softener once patient is able to reliably taking p.o.  Essential hypertension. Blood pressure stable  Hypothyroidism. Continue Synthroid.  Pyuria. UTI ruled out Urine culture had insignificant growth.  Goals of care discussion. Discussed with patient daughters at bedside, discussed with NP from palliative care.  We will continue above treatments outlined, if patient does not improve in 48 hours, can transition to comfort care. Confirmed do not resuscitate status.     Subjective:  Do not sleep last night, but agitated today. Still on high flow oxygen, some shortness of breath. Very constipated.  Patient also had a residual of 900 mL, Foley catheter anchored  Physical Exam: Vitals:   04/14/22 0424 04/14/22 0650 04/14/22 0840 04/14/22 1242  BP:   (!) 147/81 (!) 167/77  Pulse:   69 71  Resp:   20 19  Temp:   98 F (36.7 C) 98 F (36.7 C)  TempSrc:   Axillary   SpO2:  97% 96%   Weight: 94.3 kg      General exam: Appears uncomfortable and agitated. Respiratory system: Decreased breath sounds. Respiratory effort normal. Cardiovascular system: S1 & S2 heard, RRR. No JVD, murmurs, rubs, gallops or clicks. No pedal edema. Gastrointestinal system: Abdomen is nondistended, soft and  lower abd tender. No organomegaly or masses felt. Normal bowel sounds heard. Central nervous  system: Totally confused and agitated. Extremities: Symmetric 5 x 5 power. Skin: No rashes, lesions or ulcers   Data Reviewed:  Reviewed all lab results.  Family Communication: Daughter was  updated as above.  Disposition: Status is: Inpatient Remains inpatient appropriate because: Disease, IV treatment and altered mental status.  Planned Discharge Destination:  To be determined    Time spent: 60 minutes  Author: Sharen Hones, MD 04/14/2022 12:55 PM  For on call review [email or web address].

## 2022-04-14 NOTE — Progress Notes (Addendum)
       CROSS COVER NOTE  NAME: [name] MRN: [id number] DOB : Aug 02, 1919  Secure chat received from nursing "pt confused, agitated. I just gave 0.'5mg'$  ativan IV. he seems uncomfortable. has chronic osteo in legs. takes tylenol at home. can we maybe do IV tylenol? he is too confused for anything PO   he is also short of breath, on 4L. will try to give nebulizer but restless. lots of secretions but unable to cough up. can you come see him please? "  On arrival to bedside [name] is restless in bed with increased work of breathing. Breath sounds are rhonchorous throughout.   Daughter at bedside confirms DNR status but is open to Bipap. She reports family would like to give patient another day to see if he turns the corner before considering comfort care or hospice.  Plan: - Duoneb - Bipap, wean as able - CXR - Dilaudid 0.5 (does not tolerate morphine per prior notes and NPO) for leg pain - Robinol - Lasix.Marland Kitchen.(held 2/2 Creatinine, dose given tonight at 2000. Will redose now) - I/O cath x2, bladder scan >980m each time  KAlesia Banda MHA, FNP-BC Nurse Practitioner [facility]ager ([phone]

## 2022-04-15 DIAGNOSIS — G9341 Metabolic encephalopathy: Secondary | ICD-10-CM | POA: Diagnosis not present

## 2022-04-15 DIAGNOSIS — J690 Pneumonitis due to inhalation of food and vomit: Secondary | ICD-10-CM | POA: Diagnosis not present

## 2022-04-15 DIAGNOSIS — M86171 Other acute osteomyelitis, right ankle and foot: Secondary | ICD-10-CM | POA: Diagnosis not present

## 2022-04-15 LAB — BASIC METABOLIC PANEL
Anion gap: 12 (ref 5–15)
BUN: 34 mg/dL — ABNORMAL HIGH (ref 8–23)
CO2: 21 mmol/L — ABNORMAL LOW (ref 22–32)
Calcium: 8.7 mg/dL — ABNORMAL LOW (ref 8.9–10.3)
Chloride: 105 mmol/L (ref 98–111)
Creatinine, Ser: 2 mg/dL — ABNORMAL HIGH (ref 0.61–1.24)
GFR, Estimated: 29 mL/min — ABNORMAL LOW (ref >60)
Glucose, Bld: 103 mg/dL — ABNORMAL HIGH (ref 70–99)
Potassium: 3.9 mmol/L (ref 3.5–5.1)
Sodium: 138 mmol/L (ref 135–145)

## 2022-04-15 LAB — MAGNESIUM: Magnesium: 2 mg/dL (ref 1.7–2.4)

## 2022-04-15 MED ORDER — TAMSULOSIN HCL 0.4 MG PO CAPS
0.4000 mg | ORAL_CAPSULE | Freq: Every day | ORAL | Status: DC
  Administered 2022-04-15 – 2022-04-20 (×6): 0.4 mg via ORAL
  Filled 2022-04-15 (×6): qty 1

## 2022-04-15 MED ORDER — QUETIAPINE FUMARATE 25 MG PO TABS
25.0000 mg | ORAL_TABLET | Freq: Every day | ORAL | Status: DC
  Administered 2022-04-15 – 2022-04-19 (×5): 25 mg via ORAL
  Filled 2022-04-15 (×5): qty 1

## 2022-04-15 MED ORDER — ENSURE ENLIVE PO LIQD
237.0000 mL | Freq: Two times a day (BID) | ORAL | Status: DC
  Administered 2022-04-15 – 2022-04-20 (×7): 237 mL via ORAL

## 2022-04-15 MED ORDER — ASCORBIC ACID 500 MG PO TABS
500.0000 mg | ORAL_TABLET | Freq: Two times a day (BID) | ORAL | Status: DC
Start: 2022-04-15 — End: 2022-04-15

## 2022-04-15 MED ORDER — ADULT MULTIVITAMIN W/MINERALS CH: 1.0000 | ORAL_TABLET | Freq: Every day | ORAL | Status: DC

## 2022-04-15 MED ORDER — ZINC SULFATE 220 (50 ZN) MG PO CAPS: 220.0000 mg | ORAL_CAPSULE | Freq: Every day | ORAL | Status: DC

## 2022-04-15 NOTE — Telephone Encounter (Signed)
Spoke w/Becky w/[facility]: Per Becky she is going to use DX of Osteomyelitis so she can proceed.

## 2022-04-15 NOTE — Progress Notes (Signed)
Nutrition Follow-up  DOCUMENTATION CODES:   Not applicable  INTERVENTION:   -Ensure Enlive po BID, each supplement provides 350 kcal and 20 grams of protein -MVI with minerals daily -500 mg vitamin C BID -220 mg zinc sulfate daily x 14 days  NUTRITION DIAGNOSIS:   Inadequate oral intake related to inability to eat as evidenced by NPO status.  Progressing; advanced to dysphagia 3 diet  GOAL:   Patient will meet greater than or equal to 90% of their needs  Progressing   MONITOR:   PO intake, Supplement acceptance, Diet advancement  REASON FOR ASSESSMENT:   Consult Wound healing  ASSESSMENT:   [name] with medical history significant  of Htn and pacemaker for CHB and atrial fibrillation not a candidate for [facility] admitted for osteomeylitis.  Reviewed I/O's: -2.3 L x 24 hours and -4.6 L since admission  UOP: 2.8 L x 24 hours  [name] lethargic at time of visit, but would occasionally open his eyes and mumble incoherently when RD engaged him. History obtained from [name]'s two daughters at bedside, who suspect [name] is sleepy due to seroquel; [name] was alert yesterday and was able to work briefly with physical therapy, transferring to the side of bed and chair. Daughters reports that [name] lived alone in his home PTA, but children rotated in and out of his home throughout the day so there was always someone present with him in the home. [name] had a good appetite PTA, consuming 3 meals per day. [name] follows a mechanical soft diet at home secondary to ill fitting dentures and eats a lot of soft vegetables, fish, and beans. Meat is very limited in diet due to difficulty chewing. [name] has not been alert enough to eat present, but daughters report [name] was able to take medications today.   Per daughters, [name]'s wound was followed by the wound center and they were doing daily dressings up until recently when he started "skin treatments" weekly and were told not to use dressings. [name] was not taking any vitamins or  supplements PTA.   [name] daughters deny weight loss. Reviewed wt hx; [name] has experienced a 9.6% wt loss over the past 7 months, which is not significant for time frame.   Palliative care following for goals of care discussions; [name] family hopeful that encephalopathy will improve and are waiting for time for outcomes.    Medications reviewed and include miralax.   Labs reviewed.   NUTRITION - FOCUSED PHYSICAL EXAM:  Flowsheet Row Most Recent Value  Orbital Region No depletion  Upper Arm Region Mild depletion  Thoracic and Lumbar Region No depletion  Buccal Region No depletion  Temple Region No depletion  Clavicle Bone Region No depletion  Clavicle and Acromion Bone Region No depletion  Scapular Bone Region No depletion  Dorsal Hand Mild depletion  Patellar Region No depletion  Anterior Thigh Region No depletion  Posterior Calf Region No depletion  Edema (RD Assessment) Mild  Hair Reviewed  Eyes Reviewed  Mouth Reviewed  Skin Reviewed  Nails Reviewed       Diet Order:   Diet Order             DIET DYS 3 Room service appropriate? Yes; Fluid consistency: Thin  Diet effective now                   EDUCATION NEEDS:   Education needs have been addressed  Skin:  Skin Assessment: Skin Integrity Issues: Skin Integrity Issues:: Other (Comment) Other: chronic full  thickness wound to right outer foot  Last BM:  04/10/22  Height:   Ht Readings from Last 1 Encounters:  03/03/22 '6\' 1"'$  (1.854 m)    Weight:   Wt Readings from Last 1 Encounters:  04/14/22 94.3 kg    Ideal Body Weight:  83.6 kg  BMI:  Body mass index is 27.43 kg/m.  Estimated Nutritional Needs:   Kcal:  2050-2250  Protein:  105-120 grams  Fluid:  > 2 L    Loistine Chance, RD, LDN, [place] Registered Dietitian II Certified Diabetes Care and Education Specialist Please refer to [facility] for RD and/or RD on-call/weekend/after hours pager

## 2022-04-15 NOTE — Progress Notes (Signed)
  Progress Note   Patient: [name] VOH:[id number] DOB: [birth year] DOA: 04/12/2022     3 DOS: the patient was seen and examined on 04/15/2022   Brief hospital course: 88 year old male with hypothyroidism, congestive heart failure, CKD with baseline creatinine IV 1.96-2.19 hypertension persistent A-fib with pacer due to complete heart block, on Xarelto, wound on the right foot who lives at home by himself but family nearby brought to the ED by the daughter due to altered mental status.   Assessment and Plan: Acute metabolic encephalopathy. Sleep deprivation. Patient slept through the night, he no longer is agitated.  He was able to recognize his daughters, he was also able to drink some water today. We will continue Seroquel and melatonin at nighttime.  Discontinue as needed benzodiazepine.   Acute hypoxemic respiratory failure. Bilateral aspiration pneumonia. Patient has increased oxygen requirement overnight.  But he appears to be able to protect his airway today. No gross volume overload. Continue antibiotics with Zosyn.  Continue oxygen treatment, continue DuoNeb as needed.   Acute kidney injury superimposed on chronic kidney disease stage IV. Continue to follow, renal function stable.  Foot ulcer with chronic osteomyelitis of right foot distal fifth metatarsal head and neck. This is probably not the source of metabolic encephalopathy. On Zosyn and doxycycline.   Urinary retention due to benign prostate hypertrophy Constipation. Continue Foley catheter, start Flomax.  Continue MiraLAX started yesterday.  Essential hypertension. Blood pressure stable  Hypothyroidism. Continue Synthroid.  Pyuria. UTI ruled out Urine culture had insignificant growth.        Subjective:  Patient slept the whole night last night, he woke up this morning less confused.  Had an increased oxygen requirement, did not feel short of breath  Physical Exam: Vitals:   04/15/22 0510 04/15/22  0730 04/15/22 0803 04/15/22 1203  BP: (!) 127/59 (!) 111/59  122/60  Pulse:  64 66 64  Resp:  '19 20 16  '$ Temp: 98.2 F (36.8 C) 98.4 F (36.9 C)  98 F (36.7 C)  TempSrc: Oral   Axillary  SpO2: 96% 98% 91% 100%  Weight:       General exam: Appears calm and comfortable  Respiratory system: Clear to auscultation. Respiratory effort normal. Cardiovascular system: S1 & S2 heard, RRR. No JVD, murmurs, rubs, gallops or clicks. No pedal edema. Gastrointestinal system: Abdomen is nondistended, soft and nontender. No organomegaly or masses felt. Normal bowel sounds heard. Central nervous system: Drowsy and oriented x1. and oriented. No focal neurological deficits. Extremities: Symmetric 5 x 5 power. Skin: No rashes, lesions or ulcers    Data Reviewed:  Lab results reviewed  Family Communication: Daughters and son updated over 2 visits to the patient bedside  Disposition: Status is: Inpatient Remains inpatient appropriate because: Severity of disease, altered mental status  Planned Discharge Destination: Skilled nursing facility    Time spent: 55 minutes, more than 50% time involving direct patient care.  Author: Sharen Hones, MD 04/15/2022 2:19 PM  For on call review [email or web address].

## 2022-04-15 NOTE — Progress Notes (Signed)
Pt on 5Lnc at start of shift. RN gradually titrated down to 3Lnc overnight. Around 0235, sats dropped to 70s when pt sleeping with 3L O2 [place]. Pt sleeping and breathing through mouth with mouth wide open. Respirations unlabored. No wheezes heard. Increased O2 up to 6Lnc and sats back >90 within 1-2 minutes. RT and NP notified. Sats stayed 96-98% on 6L then turned back down to 5Lnc, sats remain >90.

## 2022-04-15 NOTE — NC FL2 (Signed)
[place] LEVEL OF CARE SCREENING TOOL     IDENTIFICATION  Patient Name: [name] Birthdate: 10/11/1919 Sex: male Admission Date (Current Location): 04/12/2022  [facility] and [place] Number:  Engineering geologist and Address:   [facility], [address] [employer], [place] [address]      Provider Number: [id number]  Attending Physician Name and Address:  Sharen Hones, MD  Relative Name and Phone Number:  [name] [phone]    Current Level of Care: Hospital Recommended Level of Care: [place] Prior Approval Number:    Date Approved/Denied:   PASRR Number: [id number]  Discharge Plan: SNF    Current Diagnoses: Patient Active Problem List   Diagnosis Date Noted   Acute metabolic encephalopathy 98/92/1194   Aspiration pneumonia of both lower lobes ([place]) 04/14/2022   Acute urinary retention 04/14/2022   Acute respiratory failure with hypoxia ([place]) 04/13/2022   Decreased pedal pulses 04/13/2022   Acute kidney injury superimposed on chronic kidney disease ([place]) 04/13/2022   Osteomyelitis ([place]) 04/12/2022   Leg swelling 12/02/2020   Heart block 05/27/2020   Fall 05/27/2020   Heart block AV complete ([place]) 05/27/2020   Hypothyroidism 11/07/2018   CKD (chronic kidney disease), stage IV ([place]) 11/07/2018   Abnormal liver function 11/07/2018   NHL (non-Hodgkin's lymphoma) ([place]) 11/07/2018   Venous stasis dermatitis 05/28/2015   Insomnia 05/28/2015   Cancer ([place]) 09/05/2013   Lymphadenopathy, cervical 08/29/2013   Acid reflux    Hypertension    Peripheral vascular disease ([place])     Orientation RESPIRATION BLADDER Height & Weight     Self, Time  O2 (HFNC) Incontinent Weight: 94.3 kg Height:     BEHAVIORAL SYMPTOMS/MOOD NEUROLOGICAL BOWEL NUTRITION STATUS      Continent Diet (DYS 3)  AMBULATORY STATUS COMMUNICATION OF NEEDS Skin   Limited Assist Verbally Normal, Skin abrasions (Ecchymosis  arm leg)                        Personal Care Assistance Level of Assistance  Bathing, Feeding, Dressing Bathing Assistance: Limited assistance Feeding assistance: Limited assistance Dressing Assistance: Limited assistance     Functional Limitations Info             SPECIAL CARE FACTORS FREQUENCY  PT (By licensed PT), OT (By licensed OT)     PT Frequency: 2x per week OT Frequency: 2x per week            Contractures Contractures Info: Not present    Additional Factors Info  Code Status, Allergies Code Status Info: DNR Allergies Info: Codeine, Percocet (Oxycodone-acetaminophen)           Current Medications (04/15/2022):  This is the current hospital active medication list Current Facility-Administered Medications  Medication Dose Route Frequency Provider Last Rate Last Admin   albuterol (PROVENTIL) (2.5 MG/3ML) 0.083% nebulizer solution 2.5 mg  2.5 mg Nebulization Q4H PRN Kc, Ramesh, MD       amLODipine (NORVASC) tablet 5 mg  5 mg Oral Daily Florina Ou V, MD   5 mg at 04/15/22 1121   aspirin EC tablet 81 mg  81 mg Oral Daily Para Skeans, MD   81 mg at 04/15/22 1121   chlorhexidine (PERIDEX) 0.12 % solution 15 mL  15 mL Mouth Rinse BID Sharen Hones, MD       Chlorhexidine Gluconate Cloth 2 % PADS 6 each  6 each Topical Daily Sharen Hones, MD   6  each at 04/15/22 1122   doxycycline (VIBRAMYCIN) 100 mg in sodium chloride 0.9 % 250 mL IVPB  100 mg Intravenous Q12H Sharen Hones, MD 125 mL/hr at 04/15/22 1131 100 mg at 04/15/22 1131   feeding supplement (ENSURE ENLIVE / ENSURE PLUS) liquid 237 mL  237 mL Oral BID BM Sharen Hones, MD   237 mL at 04/15/22 1434   haloperidol lactate (HALDOL) injection 1 mg  1 mg Intravenous Q8H PRN Kc, Maren [place], MD   1 mg at 04/13/22 1715   heparin injection 5,000 Units  5,000 Units Subcutaneous Q8H Florina Ou V, MD   5,000 Units at 04/15/22 0748   hydrALAZINE (APRESOLINE) injection 20 mg  20 mg Intravenous Q6H PRN Para Skeans, MD        ipratropium-albuterol (DUONEB) 0.5-2.5 (3) MG/3ML nebulizer solution 3 mL  3 mL Nebulization TID Antonieta Pert, MD   3 mL at 04/15/22 1433   levothyroxine (SYNTHROID) tablet 112 mcg  112 mcg Oral Daily Para Skeans, MD   112 mcg at 04/15/22 1121   MEDLINE mouth rinse  15 mL Mouth Rinse q12n4p Sharen Hones, MD   15 mL at 04/15/22 1132   melatonin tablet 2.5 mg  2.5 mg Oral QHS Sharen Hones, MD   2.5 mg at 04/14/22 2151   piperacillin-tazobactam (ZOSYN) IVPB 3.375 g  3.375 g Intravenous Q8H Ardeen Garland L, RPH 12.5 mL/hr at 04/15/22 1131 3.375 g at 04/15/22 1131   polyethylene glycol (MIRALAX / GLYCOLAX) packet 17 g  17 g Oral Daily Sharen Hones, MD   17 g at 04/15/22 1121   QUEtiapine (SEROQUEL) tablet 25 mg  25 mg Oral QHS Sharen Hones, MD       sodium chloride flush (NS) 0.9 % injection 3 mL  3 mL Intravenous Q12H Florina Ou V, MD   3 mL at 04/15/22 1122   tamsulosin (FLOMAX) capsule 0.4 mg  0.4 mg Oral QPC supper Sharen Hones, MD         Discharge Medications: Please see discharge summary for a list of discharge medications.  Relevant Imaging Results:  Relevant Lab Results:   Additional Information Patient's SS# [id number]  Laurena Slimmer, RN

## 2022-04-15 NOTE — Progress Notes (Signed)
   04/15/22 0900  Clinical Encounter Type  Visited With Patient and family together  Visit Type Follow-up   Chaplain provided follow up support.

## 2022-04-15 NOTE — TOC Initial Note (Signed)
Transition of Care [facility]) - Initial/Assessment Note    Patient Details  Name: [name] MRN: [id number] Date of Birth: [birth year]  Transition [facility]) CM/SW Contact:    Laurena Slimmer, RN Phone Number: 04/15/2022, 4:38 PM  Clinical Narrative:                 Attempt to reach family for choice of facility. Son request to speak with Becky May. FL2 completed.         Patient Goals and CMS Choice        Expected Discharge Plan and Services                                                Prior Living Arrangements/Services                       Activities of Daily Living Home Assistive Devices/Equipment: Gilford Rile (specify type) ADL Screening (condition at time of admission) Patient's cognitive ability adequate to safely complete daily activities?: No Is the patient deaf or have difficulty hearing?: No Does the patient have difficulty seeing, even when wearing glasses/contacts?: No Does the patient have difficulty concentrating, remembering, or making decisions?: Yes Patient able to express need for assistance with ADLs?: No Does the patient have difficulty dressing or bathing?: Yes Independently performs ADLs?: No Communication: Independent Dressing (OT): Needs assistance Is this a change from baseline?: Change from baseline, expected to last >3 days Grooming: Needs assistance Is this a change from baseline?: Change from baseline, expected to last >3 days Feeding: Independent Bathing: Needs assistance Is this a change from baseline?: Change from baseline, expected to last >3 days Toileting: Needs assistance Is this a change from baseline?: Change from baseline, expected to last >3days In/Out Bed: Needs assistance Is this a change from baseline?: Change from baseline, expected to last >3 days Walks in Home: Needs assistance Is this a change from baseline?: Change from baseline, expected to last >3 days Does the patient have difficulty walking or  climbing stairs?: Yes Weakness of Legs: Both Weakness of Arms/Hands: None  Permission Sought/Granted                  Emotional Assessment              Admission diagnosis:  Osteomyelitis ([place]) [E72.0] Acute metabolic encephalopathy [N47.09] Other acute osteomyelitis of right foot ([place]) [M86.171] Patient Active Problem List   Diagnosis Date Noted   Acute metabolic encephalopathy 62/83/6629   Aspiration pneumonia of both lower lobes ([place]) 04/14/2022   Acute urinary retention 04/14/2022   Acute respiratory failure with hypoxia ([place]) 04/13/2022   Decreased pedal pulses 04/13/2022   Acute kidney injury superimposed on chronic kidney disease ([place]) 04/13/2022   Osteomyelitis ([place]) 04/12/2022   Leg swelling 12/02/2020   Heart block 05/27/2020   Fall 05/27/2020   Heart block AV complete ([place]) 05/27/2020   Hypothyroidism 11/07/2018   CKD (chronic kidney disease), stage IV ([place]) 11/07/2018   Abnormal liver function 11/07/2018   NHL (non-Hodgkin's lymphoma) ([place]) 11/07/2018   Venous stasis dermatitis 05/28/2015   Insomnia 05/28/2015   Cancer ([place]) 09/05/2013   Lymphadenopathy, cervical 08/29/2013    Class: Chronic   Acid reflux    Hypertension    Peripheral vascular disease ([place])    PCP:  Susy Frizzle, MD Pharmacy:  [id number]Phone: [phone]: [phone] [facility] [address]Phone: [phone]: [phone] [facility] ) - [address] [address]Phone: [phone]: [phone]    Social Determinants of Health (SDOH) Interventions    Readmission Risk Interventions     View : No data to display.

## 2022-04-16 ENCOUNTER — Encounter: Payer: Self-pay | Admitting: Internal Medicine

## 2022-04-16 DIAGNOSIS — M86171 Other acute osteomyelitis, right ankle and foot: Secondary | ICD-10-CM | POA: Diagnosis not present

## 2022-04-16 DIAGNOSIS — N184 Chronic kidney disease, stage 4 (severe): Secondary | ICD-10-CM | POA: Diagnosis not present

## 2022-04-16 DIAGNOSIS — G9341 Metabolic encephalopathy: Secondary | ICD-10-CM | POA: Diagnosis not present

## 2022-04-16 DIAGNOSIS — N170 Acute kidney failure with tubular necrosis: Secondary | ICD-10-CM

## 2022-04-16 DIAGNOSIS — E872 Acidosis, unspecified: Secondary | ICD-10-CM

## 2022-04-16 LAB — BASIC METABOLIC PANEL
Anion gap: 10 (ref 5–15)
BUN: 37 mg/dL — ABNORMAL HIGH (ref 8–23)
CO2: 20 mmol/L — ABNORMAL LOW (ref 22–32)
Calcium: 8.6 mg/dL — ABNORMAL LOW (ref 8.9–10.3)
Chloride: 107 mmol/L (ref 98–111)
Creatinine, Ser: 1.89 mg/dL — ABNORMAL HIGH (ref 0.61–1.24)
GFR, Estimated: 31 mL/min — ABNORMAL LOW (ref >60)
Glucose, Bld: 99 mg/dL (ref 70–99)
Potassium: 4.1 mmol/L (ref 3.5–5.1)
Sodium: 137 mmol/L (ref 135–145)

## 2022-04-16 MED ORDER — SODIUM BICARBONATE 650 MG PO TABS
650.0000 mg | ORAL_TABLET | Freq: Four times a day (QID) | ORAL | Status: DC
  Administered 2022-04-16 – 2022-04-20 (×15): 650 mg via ORAL
  Filled 2022-04-16 (×14): qty 1

## 2022-04-16 MED ORDER — IPRATROPIUM-ALBUTEROL 0.5-2.5 (3) MG/3ML IN SOLN
3.0000 mL | Freq: Two times a day (BID) | RESPIRATORY_TRACT | Status: DC
  Administered 2022-04-16 – 2022-04-17 (×2): 3 mL via RESPIRATORY_TRACT
  Filled 2022-04-16 (×2): qty 3

## 2022-04-16 NOTE — TOC Progression Note (Signed)
Transition of Care [facility]) - Progression Note    Patient Details  Name: [name] MRN: [id number] Date of Birth: 1919-06-05  [facility]) CM/SW Contact  Izola Price, RN Phone Number: 04/16/2022, 3:57 PM  Clinical Narrative:  355 pm: Attempted to reach daughter, Cindra Eves, at [phone]. Left confidential VM to return call to CM with call back number. Simmie Davies RN CM        Barriers to Discharge: Continued Medical Work up  Expected Discharge Plan and Services                                                 Social Determinants of Health (SDOH) Interventions    Readmission Risk Interventions     View : No data to display.

## 2022-04-16 NOTE — Progress Notes (Signed)
Patient still has foley in as family wants to wait until he wakes up. Propt still has foley family wants to wait until he wakes up. Provider notified. No new orders.

## 2022-04-16 NOTE — Progress Notes (Signed)
  Progress Note   Patient: [name] MBW:[id number] DOB: 11/03/1919 DOA: 04/12/2022     4 DOS: the patient was seen and examined on 04/16/2022   Brief hospital course: 88 year old male with hypothyroidism, congestive heart failure, CKD with baseline creatinine IV 1.96-2.19 hypertension persistent A-fib with pacer due to complete heart block, on Xarelto, wound on the right foot who lives at home by himself but family nearby brought to the ED by the daughter due to altered mental status.   Assessment and Plan:  Acute metabolic encephalopathy. Sleep deprivation. Patient condition much improved, patient was able to sleep at nighttime.  Less confused at daytime.  No longer has any agitation.  We will continue lower dose Seroquel and melatonin.    Acute hypoxemic respiratory failure. Bilateral aspiration pneumonia. Patient is seen by speech therapy, continued on dysphagia 3 diet. Patient seem to better protecting his airway after mental status improved. Continue Zosyn for now. Oxygenation gradually improving, continue wean oxygen.   Acute kidney injury superimposed on chronic kidney disease stage IV. Metabolic acidosis. Function improving, developed mild metabolic acidosis.  Start sodium bicarbonate orally.   Foot ulcer with chronic osteomyelitis of right foot distal fifth metatarsal head and neck. This is probably not the source of metabolic encephalopathy. On Zosyn and doxycycline.   Urinary retention due to benign prostate hypertrophy Constipation. Patient had large bowel movements after giving MiraLAX.  Urinary tension partially was caused by constipation.  At this point, I will discontinue Foley catheter, continue Flomax.  Also continue as needed bladder scan.  Essential hypertension. Continue current treatment     Subjective:  Patient condition much improved, he slept through the night.  He woke up this morning without any significant confusion.  He is protecting his airway  better. He had a large bowel movement after giving MiraLAX.  He feels hungry, request solid diet.  Physical Exam: Vitals:   04/16/22 0735 04/16/22 0804 04/16/22 1051 04/16/22 1117  BP: (!) 105/50   103/60  Pulse: 64 68  64  Resp: '18 16  16  '$ Temp: 98 F (36.7 C)   97.7 F (36.5 C)  TempSrc:      SpO2: 98% 98% 98% 100%  Weight:       General exam: Appears calm and comfortable  Respiratory system: Clear to auscultation. Respiratory effort normal. Cardiovascular system: S1 & S2 heard, RRR. No JVD, murmurs, rubs, gallops or clicks. No pedal edema. Gastrointestinal system: Abdomen is nondistended, soft and nontender. No organomegaly or masses felt. Normal bowel sounds heard. Central nervous system: Alert and oriented x2. No focal neurological deficits. Extremities: Symmetric 5 x 5 power. Skin: No rashes, lesions or ulcers Psychiatry: Judgement and insight appear normal. Mood & affect appropriate.   Data Reviewed:  Lab results reviewed.  Family Communication: Daughter was updated.  Disposition: Status is: Inpatient Remains inpatient appropriate because: Severity of disease, altered mental status, IV treatment  Planned Discharge Destination: Skilled nursing facility    Time spent: 35 minutes  Author: Sharen Hones, MD 04/16/2022 12:14 PM  For on call review [email or web address].

## 2022-04-16 NOTE — Progress Notes (Signed)
Clinical/Bedside Swallow Evaluation Patient Details  Name: [name] MRN: [id number] Date of Birth: 06-03-1919  Today's Date: 04/16/2022 Time: SLP Start Time (ACUTE ONLY): 39 SLP Stop Time (ACUTE ONLY): 1105 SLP Time Calculation (min) (ACUTE ONLY): 13 min  Past Medical History:  Past Medical History:  Diagnosis Date   Acid reflux    Cancer ([place]) 09/05/13   left neck-non-hodgkins lymphoma   CKD (chronic kidney disease), stage IV ([place]) 11/07/2018   Hypertension    requires diuretic   Hypothyroidism    Malaria    while in the service   Peripheral vascular disease ([place])    slow wound healing on legs   Past Surgical History:  Past Surgical History:  Procedure Laterality Date   AMPUTATION Left 06/25/2015   Procedure: LEFT  HALLUX AMPUTATION,;  Surgeon: Wylene Simmer, MD;  Location: [place];  Service: Orthopedics;  Laterality: Left;  LOCAL/MAC   CHOLECYSTECTOMY N/A 11/08/2018   Procedure: LAPAROSCOPIC CHOLECYSTECTOMY WITH INTRAOPERATIVE CHOLANGIOGRAM;  Surgeon: [name], MD;  Location: [facility];  Service: General;  Laterality: N/A;   MASS BIOPSY Left 09/05/2013   Procedure: EXCISIONAL BIOPSY OF LEFT NECK MASS;  Surgeon: Jerrell Belfast, MD;  Location: [place];  Service: ENT;  Laterality: Left;   PACEMAKER IMPLANT N/A 05/28/2020   Procedure: PACEMAKER IMPLANT;  Surgeon: Thompson Grayer, MD;  Location: [place] [facility];  Service: Cardiovascular;  Laterality: N/A;   TONSILLECTOMY     YAG LASER APPLICATION Right 7/74/1287   Procedure: YAG LASER APPLICATION;  Surgeon: [name], MD;  Location: [facility];  Service: Ophthalmology;  Laterality: Right;   HPI:  Pt is a 88 y.o. male with PMH noted for HTN, pacemaker for CHB and afib. Per MD notes concern for bilateral aspiration PNA; CXR on 04/14/22 showed bilateral airspace opacities particularly in the right upper lobe. Palliative care consulting. RN reports pt improving mentation today. Pt tolerated meds, Ensure, broth without  difficulty this morning.    Assessment / Plan / Recommendation  Clinical Impression  Patient presents with appearance of adequate airway protection when consuming thin liquids, puree and softened solids. Patient alert and following commands, responds appropriately to simple questions although he is hard of hearing. Daughters present at bedside. They report pt with signs of aspiration yesterday however this has improved with mentation today. He consumed broth for breakfast this morning without difficulty. RN reports no difficulty with medications or Ensure. Oral motor examination is WFL; pt is without his dentures so avoids eating meats, per daughters.   Patient tolerated upright positioning in bed, but did require assistance for feeding due to fatigue (had PT, then OT prior to ST assessment). Pt accepted and manipulated ice chips functionally with appearance of timely transfer and swallow initiation. He was able to hold a cup in his hand and sip from the cup with min assist for steadying. Swallow appears timely with no overt signs of aspiration. Pt accepted teaspoons of pudding and cracker softened in pudding, again with functional oral manipulations and appearance of adequate airway protection. Liquid wash via straw with no overt signs of aspiration. After a few bites/sips of each consistency, pt reported, "that's enough." And declined further trials. SLP educated daughters re: general aspiration precautions such as positioning fully upright, reducing mealtime distractions and eating only when alert. Reviewed daughter's menu choices for lunch and dinner which appear appropriate. Recommend continuing dysphagia 3 (mechanical soft) and thin liquids, meds as tolerated or whole with puree if pt has any difficulty taking  with liquid. No further skilled ST indicated, however please reconsult if pt exhibits difficulty with POs.      SLP Visit Diagnosis: Dysphagia, oropharyngeal phase (R13.12)    Aspiration  Risk  Mild aspiration risk;Moderate aspiration risk    Diet Recommendation Dysphagia 3 (Mech soft);Thin liquid (meats chopped/moistened)   Liquid Administration via: Cup;Straw Medication Administration: Whole meds with puree Supervision: Staff to assist with self feeding Compensations: Slow rate;Small sips/bites;Minimize environmental distractions Postural Changes: Seated upright at 90 degrees;Remain upright for at least 30 minutes after po intake    Other  Recommendations Oral Care Recommendations: Oral care before and after PO    Recommendations for follow up therapy are one component of a multi-disciplinary discharge planning process, led by the attending physician.  Recommendations may be updated based on patient status, additional functional criteria and insurance authorization.  Follow up Recommendations No SLP follow up      Assistance Recommended at Discharge Frequent or constant Supervision/Assistance  Functional Status Assessment Patient has had a recent decline in their functional status and/or demonstrates limited ability to make significant improvements in function in a reasonable and predictable amount of time  Frequency and Duration Other (Comment) (one time visit)          Prognosis Prognosis for Safe Diet Advancement: Guarded (pt's baseline diet due to dentition)      Swallow Study   General Date of Onset: 04/12/22 HPI: Pt is a 88 y.o. male with PMH noted for HTN, pacemaker for CHB and afib. Per MD notes concern for bilateral aspiration PNA; CXR on 04/14/22 showed bilateral airspace opacities particularly in the right upper lobe. Palliative care consulting. RN reports pt improving mentation today. Pt tolerated meds, Ensure, broth without difficulty this morning. Type of Study: Bedside Swallow Evaluation Previous Swallow Assessment: none on file Diet Prior to this Study: Dysphagia 3 (soft);Thin liquids Temperature Spikes Noted: No (WBC 5/18 13.2) Respiratory  Status: Nasal cannula (5L) History of Recent Intubation: No Behavior/Cognition: Alert;Cooperative Oral Cavity Assessment: Within Functional Limits Oral Care Completed by SLP: Yes Oral Cavity - Dentition: Edentulous;Dentures, not available Vision: Functional for self-feeding Self-Feeding Abilities: Needs assist;Needs set up Patient Positioning: Upright in bed Baseline Vocal Quality: Low vocal intensity Volitional Cough: Strong Volitional Swallow: Able to elicit    Oral/Motor/Sensory Function Overall Oral Motor/Sensory Function: Within functional limits   Ice Chips Ice chips: Within functional limits Presentation: Spoon   Thin Liquid Thin Liquid: Within functional limits Presentation: Cup;Straw    Nectar Thick Nectar Thick Liquid: Not tested   Honey Thick Honey Thick Liquid: Not tested   Puree Puree: Within functional limits Presentation: Spoon   Solid     Solid: Within functional limits (mildly prolonged but functional mastication for softened graham cracker) Presentation: Jamison Oka, [place], CCC-SLP Speech-Language Pathologist [phone]  Aliene Altes 04/16/2022,11:13 AM

## 2022-04-16 NOTE — Progress Notes (Signed)
Occupational Therapy Treatment Patient Details Name: [name] MRN: [id number] DOB: [birth year] Today's Date: 04/16/2022   History of present illness 88 year old male with hypothyroidism, congestive heart failure, CKD, hypertension persistent A-fib with pacer due to complete heart block, on Xarelto, wound on the right foot who is seen at wound care clinic.  He lives at home "alone" but family nearby and around most of the time.  Brought to the ED by daughter due to altered mental status.   OT comments  Chart reviewed to date, RN cleared pt for participation in OT tx session. Pt requesting to return to bed after sitting up in chair. MAX A +2 required for STS with RW with pt unable to maintain standing despite heavy weight on walker. MAX A+2 for squat pivot back to bedside chair. MAX-TOTAL A required for peri care following BM, MOD A required for UB dressing. Pt is able to roll with MOD A with step by step vcs. Pt with improved cognition as compared to previous session however is not at baseline and continues to present with deficits. Pt is performing ADL/functional mobility below PLOF, will continue to benefit from STR to address functional deficits. Pt is left in bed, NAD, all needs met. OT will follow acutely.    Recommendations for follow up therapy are one component of a multi-disciplinary discharge planning process, led by the attending physician.  Recommendations may be updated based on patient status, additional functional criteria and insurance authorization.    Follow Up Recommendations  Skilled nursing-short term rehab (<3 hours/day)    Assistance Recommended at Discharge Frequent or constant Supervision/Assistance  Patient can return home with the following  Two people to help with walking and/or transfers;Two people to help with bathing/dressing/bathroom;Direct supervision/assist for medications management;Help with stairs or ramp for entrance;Assist for transportation;Assistance with  feeding;Direct supervision/assist for financial management;Assistance with cooking/housework   Equipment Recommendations  Other (comment) (per next venue of care)    Recommendations for Other Services      Precautions / Restrictions Precautions Precautions: Fall Restrictions Weight Bearing Restrictions: Yes RLE Weight Bearing: Weight bearing as tolerated       Mobility Bed Mobility Overal bed mobility: Needs Assistance Bed Mobility: Sit to Supine Rolling: Mod assist     Sit to supine: Mod assist, HOB elevated        Transfers Overall transfer level: Needs assistance Equipment used: Rolling walker (2 wheels) Transfers: Sit to/from Stand Sit to Stand: Max assist, +2 physical assistance           General transfer comment: step by step vcs throughout, pt fatigued from sitting up in the chair requiring increased physical assist     Balance Overall balance assessment: Needs assistance Sitting-balance support: Bilateral upper extremity supported, Feet supported Sitting balance-Leahy Scale: Fair   Postural control: Posterior lean Standing balance support: Bilateral upper extremity supported, During functional activity, Reliant on assistive device for balance Standing balance-Leahy Scale: Zero                             ADL either performed or assessed with clinical judgement   ADL Overall ADL's : Needs assistance/impaired             Lower Body Bathing: Maximal assistance;Bed level   Upper Body Dressing : Moderate assistance;Sitting       Toilet Transfer: Maximal assistance;+2 for physical assistance;+2 for safety/equipment;Squat-pivot   Toileting- Clothing Manipulation and Hygiene: Cueing for safety;Bed level;Sit to/from  stand              Extremity/Trunk Assessment              Vision       Perception     Praxis      Cognition Arousal/Alertness: Awake/alert, Lethargic Behavior During Therapy: WFL for tasks  assessed/performed Overall Cognitive Status: Impaired/Different from baseline Area of Impairment: Orientation, Attention, Memory, Following commands, Awareness, Problem solving, Safety/judgement                 Orientation Level: Disoriented to, Situation, Time Current Attention Level: Sustained Memory: Decreased recall of precautions, Decreased short-term memory Following Commands: Follows one step commands with increased time Safety/Judgement: Decreased awareness of safety, Decreased awareness of deficits Awareness: Emergent Problem Solving: Slow processing, Decreased initiation, Difficulty sequencing, Requires verbal cues, Requires tactile cues General Comments: Improved cognition as compared to evaluation however pt not at baseline.        Exercises      Shoulder Instructions       General Comments      Pertinent Vitals/ Pain       Pain Assessment Pain Assessment: No/denies pain  Home Living                                          Prior Functioning/Environment              Frequency  Min 2X/week        Progress Toward Goals  OT Goals(current goals can now be found in the care plan section)  Progress towards OT goals: Progressing toward goals     Plan      Co-evaluation        PT goals addressed during session: Mobility/safety with mobility;Balance;Proper use of DME;Strengthening/ROM        AM-PAC OT "6 Clicks" Daily Activity     Outcome Measure   Help from another person eating meals?: A Little Help from another person taking care of personal grooming?: A Lot Help from another person toileting, which includes using toliet, bedpan, or urinal?: Total Help from another person bathing (including washing, rinsing, drying)?: A Lot Help from another person to put on and taking off regular upper body clothing?: A Lot Help from another person to put on and taking off regular lower body clothing?: A Lot 6 Click Score: 12     End of Session Equipment Utilized During Treatment: Gait belt;Rolling walker (2 wheels)  OT Visit Diagnosis: Other abnormalities of gait and mobility (R26.89)   Activity Tolerance Patient tolerated treatment well   Patient Left in bed;with call bell/phone within reach;with bed alarm set;with nursing/sitter in room;with family/visitor present (in care of speech therapy)   Nurse Communication Mobility status        Time: 1005-1050 OT Time Calculation (min): 45 min  Charges: OT General Charges $OT Visit: 1 Visit OT Treatments $Self Care/Home Management : 38-52 mins  Shanon Payor, OTD OTR/L  04/16/22, 10:57 AM

## 2022-04-16 NOTE — Progress Notes (Signed)
Physical Therapy Treatment Patient Details Name: [name] MRN: [id number] DOB: 1919-07-13 Today's Date: 04/16/2022   History of Present Illness 88 year old male with hypothyroidism, congestive heart failure, CKD, hypertension persistent A-fib with pacer due to complete heart block, on Xarelto, wound on the right foot who is seen at wound care clinic.  He lives at home "alone" but family nearby and around most of the time.  Brought to the ED by daughter due to altered mental status.    PT Comments    Pt was long sitting in bed, awake, but somewhat lethargic. Supportive daughters x 2 in room and very pleasant. Pt is eager to get OOB to eat breakfast. He knew he was in [place] and that he was in the hospital. Unaware of full scope of current situation. He does follow simple commands consistently with increased time to process. He rolled R to short sit with increased time +1 assist. Sat EOB x several minutes prior to standing from elevated bed height and taking a few steps to recliner. Heavy use of RW but overall tolerated well. Pt was on 7 L HFNC throughout session with sao2 >92 %. HR stayed within safe range throughout session. He was repositioned in recliner for breakfast. RN to contact author when assistance is needed to return to bed. Pt is progressing. SLP ordered to assess swallowing abilities. Author recommends DC to STR if plan remains to maximize pt's independence. Acute PT will continue to follow and progress as able per current POC.    Recommendations for follow up therapy are one component of a multi-disciplinary discharge planning process, led by the attending physician.  Recommendations may be updated based on patient status, additional functional criteria and insurance authorization.  Follow Up Recommendations  Skilled nursing-short term rehab (<3 hours/day)     Assistance Recommended at Discharge Frequent or constant Supervision/Assistance  Patient can return home with the  following A lot of help with walking and/or transfers;A lot of help with bathing/dressing/bathroom;Assistance with cooking/housework;Assistance with feeding;Direct supervision/assist for medications management;Direct supervision/assist for financial management;Assist for transportation;Help with stairs or ramp for entrance   Equipment Recommendations  Other (comment) (defer to next level of care)       Precautions / Restrictions Precautions Precautions: Fall Restrictions Weight Bearing Restrictions: Yes RLE Weight Bearing: Weight bearing as tolerated     Mobility  Bed Mobility Overal bed mobility: Needs Assistance Bed Mobility: Supine to Sit, Rolling, Sidelying to Sit Rolling: Mod assist Sidelying to sit: Max assist Supine to sit: Max assist (flat bed)     General bed mobility comments: pt was able to roll R to short sit with increased time and max assist fo one. sat EOB x several minutes. endorses a little dizziness that quickly resolved.    Transfers Overall transfer level: Needs assistance Equipment used: Rolling walker (2 wheels) Transfers: Sit to/from Stand Sit to Stand: Mod assist, From elevated surface           General transfer comment: pt was able to stand from elevated bed height with mod asisst of one. Vcs throughout for improved technique and handplacement.    Ambulation/Gait Ambulation/Gait assistance: Mod assist Gait Distance (Feet): 3 Feet Assistive device: Rolling walker (2 wheels) Gait Pattern/deviations: Step-to pattern Gait velocity: decrease     General Gait Details: pt was able to take a few steps along EOB to recliner. Poor posture in standing however was able to self propel RW and able to clear BLEs to take steps. Author will return to  assist pt with returing to bed.RN aware      Balance Overall balance assessment: Needs assistance Sitting-balance support: Bilateral upper extremity supported, Feet supported Sitting balance-Leahy Scale:  Fair Sitting balance - Comments: close supervision while sitting EOB with all extremity support   Standing balance support: Bilateral upper extremity supported, During functional activity, Reliant on assistive device for balance Standing balance-Leahy Scale: Poor Standing balance comment: high fall risk in standing even with BUE support        Cognition Arousal/Alertness: Awake/alert, Lethargic Behavior During Therapy: WFL for tasks assessed/performed Overall Cognitive Status: Impaired/Different from baseline Area of Impairment: Orientation, Attention, Memory, Following commands, Awareness, Problem solving, Safety/judgement      General Comments: Pt was awake and conversational. He knew he was in the hospital and states he is in [place]. Author questions if he has good insight of current situation. poor awareness of deficits. Supportive daughters present and helpful throughout."We just want him to get stronger so he can return home someday."               Pertinent Vitals/Pain Pain Assessment Pain Assessment: No/denies pain     PT Goals (current goals can now be found in the care plan section) Acute Rehab PT Goals Patient Stated Goal: none stated Progress towards PT goals: Progressing toward goals    Frequency    Min 2X/week      PT Plan Current plan remains appropriate    Co-evaluation     PT goals addressed during session: Mobility/safety with mobility;Balance;Proper use of DME;Strengthening/ROM        AM-PAC PT "6 Clicks" Mobility   Outcome Measure  Help needed turning from your back to your side while in a flat bed without using bedrails?: A Lot Help needed moving from lying on your back to sitting on the side of a flat bed without using bedrails?: A Lot Help needed moving to and from a bed to a chair (including a wheelchair)?: A Lot Help needed standing up from a chair using your arms (e.g., wheelchair or bedside chair)?: A Lot Help needed to walk in  hospital room?: A Lot Help needed climbing 3-5 steps with a railing? : Total 6 Click Score: 11    End of Session Equipment Utilized During Treatment: Oxygen (7L HFNC) Activity Tolerance: Patient tolerated treatment well;Patient limited by lethargy;Patient limited by fatigue Patient left: in chair;with call bell/phone within reach;with nursing/sitter in room;with family/visitor present Nurse Communication: Mobility status PT Visit Diagnosis: Muscle weakness (generalized) (M62.81);Difficulty in walking, not elsewhere classified (R26.2)     Time: 8250-5397 PT Time Calculation (min) (ACUTE ONLY): 34 min  Charges:  $Therapeutic Activity: 23-37 mins                    Julaine Fusi PTA 04/16/22, 9:03 AM

## 2022-04-16 NOTE — TOC Progression Note (Addendum)
Transition of Care [facility]) - Progression Note    Patient Details  Name: [name] MRN: [id number] Date of Birth: [birth year]  Transition of Care [facility]) CM/SW Contact  Izola Price, RN Phone Number: 04/16/2022, 4:45 PM  Clinical Narrative: 5/20: 445 pm.  Ms May (daughter) returned call and requested [facility] as first choice as patient had been there prior to Covid. She lives in [place] so would like it to be close as possible. Kidney functions is biggest issue at this time to her. Was active with [facility] just PTA. (Mel was PT person). Also directed her to Medicare.gov for other possibilities but permission for broad bed search given and started. Simmie Davies RN CM        Barriers to Discharge: Continued Medical Work up  Expected Discharge Plan and Services                                                 Social Determinants of Health (SDOH) Interventions    Readmission Risk Interventions     View : No data to display.

## 2022-04-17 DIAGNOSIS — N170 Acute kidney failure with tubular necrosis: Secondary | ICD-10-CM | POA: Diagnosis not present

## 2022-04-17 DIAGNOSIS — M86171 Other acute osteomyelitis, right ankle and foot: Secondary | ICD-10-CM | POA: Diagnosis not present

## 2022-04-17 DIAGNOSIS — N184 Chronic kidney disease, stage 4 (severe): Secondary | ICD-10-CM | POA: Diagnosis not present

## 2022-04-17 DIAGNOSIS — G9341 Metabolic encephalopathy: Secondary | ICD-10-CM | POA: Diagnosis not present

## 2022-04-17 LAB — BASIC METABOLIC PANEL
Anion gap: 9 (ref 5–15)
BUN: 37 mg/dL — ABNORMAL HIGH (ref 8–23)
CO2: 23 mmol/L (ref 22–32)
Calcium: 8.7 mg/dL — ABNORMAL LOW (ref 8.9–10.3)
Chloride: 110 mmol/L (ref 98–111)
Creatinine, Ser: 1.7 mg/dL — ABNORMAL HIGH (ref 0.61–1.24)
GFR, Estimated: 36 mL/min — ABNORMAL LOW (ref >60)
Glucose, Bld: 103 mg/dL — ABNORMAL HIGH (ref 70–99)
Potassium: 4.3 mmol/L (ref 3.5–5.1)
Sodium: 142 mmol/L (ref 135–145)

## 2022-04-17 MED ORDER — AMOXICILLIN-POT CLAVULANATE 875-125 MG PO TABS
1.0000 | ORAL_TABLET | Freq: Two times a day (BID) | ORAL | Status: DC
  Administered 2022-04-17: 1 via ORAL
  Filled 2022-04-17 (×2): qty 1

## 2022-04-17 MED ORDER — DOXYCYCLINE HYCLATE 100 MG PO TABS
100.0000 mg | ORAL_TABLET | Freq: Two times a day (BID) | ORAL | Status: DC
  Administered 2022-04-17 – 2022-04-21 (×8): 100 mg via ORAL
  Filled 2022-04-17 (×8): qty 1

## 2022-04-17 MED ORDER — MEGESTROL ACETATE 400 MG/10ML PO SUSP
400.0000 mg | Freq: Every day | ORAL | Status: DC
  Administered 2022-04-17 – 2022-04-21 (×5): 400 mg via ORAL
  Filled 2022-04-17 (×5): qty 10

## 2022-04-17 MED ORDER — SODIUM CHLORIDE 0.9 % IV SOLN
100.0000 mg | INTRAVENOUS | Status: DC
  Filled 2022-04-17: qty 100

## 2022-04-17 MED ORDER — LACTATED RINGERS IV SOLN: INTRAVENOUS | Status: DC

## 2022-04-17 MED ORDER — IPRATROPIUM-ALBUTEROL 0.5-2.5 (3) MG/3ML IN SOLN: 3.0000 mL | RESPIRATORY_TRACT | Status: DC | PRN

## 2022-04-17 NOTE — Progress Notes (Addendum)
Physician notified of patient's urinary retention and amount voided. Has instructed day nurse to put foley back in. Orders will be placed.

## 2022-04-17 NOTE — TOC Progression Note (Signed)
Transition of Care [facility]) - Progression Note    Patient Details  Name: [name] MRN: [id number] Date of Birth: 01-Apr-1919  Transition of Care [facility]) CM/SW Contact  Izola Price, RN Phone Number: 04/17/2022, 12:47 PM  Clinical Narrative: 5/21: Request to speak to family in room again by unit RN.  Spoke with Becky May, daughter, at [phone] and updated her on bed search. All still pending as of 1250 pm today. She also wanted to add [facility] and [facility] as potential preferences. She may have a contact at [facility] she is reaching out to them today or tomorrow depending on who is available. Sister present in room for conversation as well. Simmie Davies RN CM       Barriers to Discharge: Continued Medical Work up  Expected Discharge Plan and Services                                                 Social Determinants of Health (SDOH) Interventions    Readmission Risk Interventions     View : No data to display.

## 2022-04-17 NOTE — Progress Notes (Signed)
  Progress Note   Patient: [name] AQT:[id number] DOB: 11-Jun-1919 DOA: 04/12/2022     5 DOS: the patient was seen and examined on 04/17/2022   Brief hospital course: 88 year old male with hypothyroidism, congestive heart failure, CKD with baseline creatinine IV 1.96-2.19 hypertension persistent A-fib with pacer due to complete heart block, on Xarelto, wound on the right foot who lives at home by himself but family nearby brought to the ED by the daughter due to altered mental status.  Patient had delirium secondary to sleep deprivation.  He also developed aspiration pneumonia with acute hypoxemic respite failure.  He was treated with the Seroquel and melatonin, mental status much improved.  Assessment and Plan: Acute metabolic encephalopathy. Sleep deprivation. Anorexia Patient condition continued to improve, he is now alert and oriented to place and person. But still has a poor appetite, inadequate p.o. intake. I will start gentle rehydration for now, added megestrol.     Acute hypoxemic respiratory failure. Bilateral aspiration pneumonia. Condition continue to improve, currently on 3 L oxygen without short of breath.  No evidence of continued aspiration this time. Discussed with podiatry, may be able to change antibiotic to oral.   Acute kidney injury superimposed on chronic kidney disease stage IV. Metabolic acidosis. Condition much improved.   Foot ulcer with chronic osteomyelitis of right foot distal fifth metatarsal head and neck. This is probably not the source of metabolic encephalopathy. Currently on IV Zosyn and doxycycline.  I will discuss with podiatry, potentially changed to Augmentin and doxycycline orally.   Urinary retention due to benign prostate hypertrophy Constipation. Had multiple bowel movements, but still had significant urinary tension.  Foley catheter is anchored again.  Essential hypertension. No change in treatment plan.      Subjective:  Doing  well, no significant confusion.  No agitation.  But appetite is still poor, limited p.o. intake.  Physical Exam: Vitals:   04/17/22 0507 04/17/22 0706 04/17/22 0742 04/17/22 1134  BP: (!) 102/57 (!) 112/56  (!) 114/59  Pulse: 63 60 68 60  Resp: '18 18 16 19  '$ Temp: 98.7 F (37.1 C) 98.2 F (36.8 C)  98 F (36.7 C)  TempSrc:      SpO2: 97% 97% 93% 100%  Weight:      Height:       General exam: Appears calm and comfortable  Respiratory system: Clear to auscultation. Respiratory effort normal. Cardiovascular system: S1 & S2 heard, RRR. No JVD, murmurs, rubs, gallops or clicks. No pedal edema. Gastrointestinal system: Abdomen is nondistended, soft and nontender. No organomegaly or masses felt. Normal bowel sounds heard. Central nervous system: Alert and oriented x2. No focal neurological deficits. Extremities: Symmetric 5 x 5 power. Skin: No rashes, lesions or ulcers  Data Reviewed:  Lab results reviewed  Family Communication: Son and granddaughter updated in room  Disposition: Status is: Inpatient Remains inpatient appropriate because: Severity of disease, IV treatment  Planned Discharge Destination: Skilled nursing facility    Time spent: 35 minutes  Author: Sharen Hones, MD 04/17/2022 1:07 PM  For on call review [email or web address].

## 2022-04-17 NOTE — Progress Notes (Signed)
In and Out cath and volume voided was 500 ml. Bladder scan prior to void was 260 ml. Urine was dark brown in color, what appeared as blood sediments and cloudy. Patient expresses relief thereafter.

## 2022-04-17 NOTE — Progress Notes (Signed)
Patient tolerating RA at SpO2 stating between 92-96%. No resp distress noted.Provider noted.

## 2022-04-17 NOTE — Progress Notes (Signed)
16 Fr foley placed with sterile technique for retention. Urine return noted. Pt tolerated the procedure well.

## 2022-04-17 NOTE — Progress Notes (Signed)
Consult for 2nd PIV due to incompatible antibiotics.  Pharmacist adjusted administration times to aid in IV access.  RN notified via secure chat.

## 2022-04-18 ENCOUNTER — Ambulatory Visit: Payer: Medicare Other | Admitting: Physician Assistant

## 2022-04-18 DIAGNOSIS — J9601 Acute respiratory failure with hypoxia: Secondary | ICD-10-CM | POA: Diagnosis not present

## 2022-04-18 DIAGNOSIS — G9341 Metabolic encephalopathy: Secondary | ICD-10-CM | POA: Diagnosis not present

## 2022-04-18 DIAGNOSIS — M86171 Other acute osteomyelitis, right ankle and foot: Secondary | ICD-10-CM | POA: Diagnosis not present

## 2022-04-18 DIAGNOSIS — Z7189 Other specified counseling: Secondary | ICD-10-CM | POA: Diagnosis not present

## 2022-04-18 LAB — BASIC METABOLIC PANEL
Anion gap: 8 (ref 5–15)
BUN: 36 mg/dL — ABNORMAL HIGH (ref 8–23)
CO2: 24 mmol/L (ref 22–32)
Calcium: 8.9 mg/dL (ref 8.9–10.3)
Chloride: 110 mmol/L (ref 98–111)
Creatinine, Ser: 1.58 mg/dL — ABNORMAL HIGH (ref 0.61–1.24)
GFR, Estimated: 39 mL/min — ABNORMAL LOW (ref >60)
Glucose, Bld: 111 mg/dL — ABNORMAL HIGH (ref 70–99)
Potassium: 4.2 mmol/L (ref 3.5–5.1)
Sodium: 142 mmol/L (ref 135–145)

## 2022-04-18 LAB — CBC
HCT: 34.2 % — ABNORMAL LOW (ref 39.0–52.0)
Hemoglobin: 10.9 g/dL — ABNORMAL LOW (ref 13.0–17.0)
MCH: 29.3 pg (ref 26.0–34.0)
MCHC: 31.9 g/dL (ref 30.0–36.0)
MCV: 91.9 fL (ref 80.0–100.0)
Platelets: 142 10*3/uL — ABNORMAL LOW (ref 150–400)
RBC: 3.72 MIL/uL — ABNORMAL LOW (ref 4.22–5.81)
RDW: 16.2 % — ABNORMAL HIGH (ref 11.5–15.5)
WBC: 5.1 10*3/uL (ref 4.0–10.5)
nRBC: 0 % (ref 0.0–0.2)

## 2022-04-18 LAB — MAGNESIUM: Magnesium: 2 mg/dL (ref 1.7–2.4)

## 2022-04-18 MED ORDER — AMOXICILLIN-POT CLAVULANATE 500-125 MG PO TABS
1.0000 | ORAL_TABLET | Freq: Two times a day (BID) | ORAL | Status: DC
Start: 2022-04-18 — End: 2022-04-21
  Administered 2022-04-18 – 2022-04-21 (×7): 500 mg via ORAL
  Filled 2022-04-18 (×7): qty 1

## 2022-04-18 NOTE — Care Management Important Message (Signed)
Important Message  Patient Details  Name: DEMITRIUS CRASS MRN: [id number] Date of Birth: 11-27-1919   Medicare Important Message Given:  Yes     Dannette Barbara 04/18/2022, 11:51 AM

## 2022-04-18 NOTE — Progress Notes (Addendum)
Daily Progress Note   Patient Name: [name]       Date: 04/18/2022 DOB: 1919/04/13  Age: 88 y.o. MRN#: [id number] Attending Physician: Sharen Hones, MD Primary Care Physician: Susy Frizzle, MD Admit Date: 04/12/2022  Reason for Consultation/Follow-up: Establishing goals of care  Subjective: Notes and labs reviewed. Patient is resting in bed with daughters at bedside. He denies complaint and states he is feeling better today than he has been; he appears frail.  Daughters discuss plans for rehab. They discuss his oral intake has been poor; he is now receiving Megace. Daughters tell me that at home he drinks a good amount of fluids, water and Gatorade. Here, they tell me he has had minimal fluid intake; IVF currently infusing at 75/hr.   Daughters tell me they cannot care for him at home. We discussed plans for rehab, and their expectations of rehab. We discussed potential need for LTC placement if rehab does not produce the independence they hope for. We discussed his poor PO intake, and determining how he does when IVF are discontinued. They confirm he would not want a feeding tube in any event.   Length of Stay: 6  Current Medications: Scheduled Meds:   amLODipine  5 mg Oral Daily   amoxicillin-clavulanate  1 tablet Oral BID   aspirin EC  81 mg Oral Daily   chlorhexidine  15 mL Mouth Rinse BID   Chlorhexidine Gluconate Cloth  6 each Topical Daily   doxycycline  100 mg Oral Q12H   feeding supplement  237 mL Oral BID BM   heparin  5,000 Units Subcutaneous Q8H   levothyroxine  112 mcg Oral Daily   mouth rinse  15 mL Mouth Rinse q12n4p   megestrol  400 mg Oral Daily   melatonin  2.5 mg Oral QHS   polyethylene glycol  17 g Oral Daily   QUEtiapine  25 mg Oral QHS   sodium  bicarbonate  650 mg Oral QID   sodium chloride flush  3 mL Intravenous Q12H   tamsulosin  0.4 mg Oral QPC supper    Continuous Infusions:   PRN Meds: albuterol, haloperidol lactate, hydrALAZINE, ipratropium-albuterol  Physical Exam Pulmonary:     Effort: Pulmonary effort is normal.  Neurological:     Mental Status: He is alert.  Vital Signs: BP 127/74 (BP Location: Right Arm)   Pulse 67   Temp 98 F (36.7 C)   Resp 17   Ht '5\' 10"'$  (1.778 m)   Wt 94.7 kg   SpO2 100%   BMI 29.96 kg/m  SpO2: SpO2: 100 % O2 Device: O2 Device: Room Air O2 Flow Rate: O2 Flow Rate (L/min): 3 L/min  Intake/output summary:  Intake/Output Summary (Last 24 hours) at 04/18/2022 1210 Last data filed at 04/18/2022 8916 Gross per 24 hour  Intake 1340.05 ml  Output 1000 ml  Net 340.05 ml   LBM: Last BM Date : 04/16/22 Baseline Weight: Weight: 89.4 kg (02/2022 weight) Most recent weight: Weight: 94.7 kg   Patient Active Problem List   Diagnosis Date Noted   Metabolic acidosis 94/50/3888   Acute metabolic encephalopathy 28/00/3491   Aspiration pneumonia of both lower lobes ([place]) 04/14/2022   Acute urinary retention 04/14/2022   Acute respiratory failure with hypoxia (H[facility]) 04/13/2022   Decreased pedal pulses 04/13/2022   Acute renal failure superimposed on stage 4 chronic kidney disease (HCC) 04/13/2022   Osteomyelitis ([place]) 04/12/2022   Leg swelling 12/02/2020   Heart block 05/27/2020   Fall 05/27/2020   Heart block AV complete ([place]) 05/27/2020   Hypothyroidism 11/07/2018   CKD (chronic kidney disease), stage IV ([place]) 11/07/2018   Abnormal liver function 11/07/2018   NHL (non-Hodgkin's lymphoma) ([place]) 11/07/2018   Venous stasis dermatitis 05/28/2015   Insomnia 05/28/2015   Cancer ([place]) 09/05/2013   Lymphadenopathy, cervical 08/29/2013   Acid reflux    Hypertension    Peripheral vascular disease [facility])     Palliative Care Assessment & Plan      Recommendations/Plan: Daughters working towards rehab placement.  PMT will continue to follow.   Code Status:    Code Status Orders  (From admission, onward)           Start     Ordered   04/12/22 1838  Do not attempt resuscitation (DNR)  Continuous       Question Answer Comment  In the event of cardiac or respiratory ARREST Do not call a "code blue"   In the event of cardiac or respiratory ARREST Do not perform Intubation, CPR, defibrillation or ACLS   In the event of cardiac or respiratory ARREST Use medication by any route, position, wound care, and other measures to relive pain and suffering. May use oxygen, suction and manual treatment of airway obstruction as needed for comfort.      04/12/22 1838           Code Status History     Date Active Date Inactive Code Status Order ID Comments User Context   05/27/2020 2222 05/29/2020 1831 DNR [id number]  Dion Body, MD Inpatient   11/07/2018 0237 11/13/2018 1448 DNR [id number]  Ivor Costa, MD ED   01/18/2017 0248 01/20/2017 1745 DNR [id number]  Karmen Bongo, MD Inpatient       Prognosis: Poor.     Thank you for allowing the Palliative Medicine Team to assist in the care of this patient.    Asencion Gowda, NP  Please contact Palliative Medicine Team phone at [phone] for questions and concerns.

## 2022-04-18 NOTE — TOC Progression Note (Signed)
Transition of Care [facility]) - Progression Note    Patient Details  Name: DECLYN DELSOL MRN: [id number] Date of Birth: 03/31/1919  Transition of [facility]) CM/SW Contact  Laurena Slimmer, RN Phone Number: 04/18/2022, 1:57 PM  Clinical Narrative:    Spoke with Patient's daughter Cindra Eves. Advised of bed offer at [facility]. Patient's daughter stated she had not made a decision about a facility and would have a decision by tomorrow. Advised patient daughter insurance authorization would be required for SNF approval. Daughter stated she was considereing other facilities and would know more tomorrow.      Barriers to Discharge: Continued Medical Work up  Expected Discharge Plan and Services                                                 Social Determinants of Health (SDOH) Interventions    Readmission Risk Interventions     View : No data to display.

## 2022-04-18 NOTE — Progress Notes (Signed)
  Progress Note   Patient: [name] WEX:[id number] DOB: 05/11/1919 DOA: 04/12/2022     6 DOS: the patient was seen and examined on 04/18/2022   Brief hospital course: 88 year old male with hypothyroidism, congestive heart failure, CKD with baseline creatinine IV 1.96-2.19 hypertension persistent A-fib with pacer due to complete heart block, on Xarelto, wound on the right foot who lives at home by himself but family nearby brought to the ED by the daughter due to altered mental status.  Patient had delirium secondary to sleep deprivation.  He also developed aspiration pneumonia with acute hypoxemic respite failure.  He was treated with the Seroquel and melatonin, mental status much improved.  Assessment and Plan: Acute metabolic encephalopathy. Sleep deprivation. Anorexia Patient received IV fluids overnight, please also started on megestrol.  He was able to eat small meals. Patient no longer has any agitation.     Acute hypoxemic respiratory failure. Bilateral aspiration pneumonia. Condition improved, off oxygen.  Completed 5 days antibiotics after today.   Acute kidney injury superimposed on chronic kidney disease stage IV. Metabolic acidosis. Condition much improved.   Foot ulcer with chronic osteomyelitis of right foot distal fifth metatarsal head and neck. This is probably not the source of metabolic encephalopathy. Continue oral antibiotics with Augmentin and doxycycline.  Patient will be seen by podiatry again.  Discussed with Dr. Guy Sandifer.  Urinary retention due to benign prostate hypertrophy Constipation. Foley catheter was really anchored 5/21, will need to keep it for at least 2 weeks for now.  Continue Flomax.  Essential hypertension. Blood pressure stable.  Discharge planning. Patient will be discharged to nursing home.  If appetite improving, he will be discharged to nursing home with rehab.  If still has a poor appetite, probably nursing home with palliative  care versus hospice.      Subjective:  Patient has some labored breathing this morning after receiving fluids overnight.  Appetite appears to be improving since yesterday.  Physical Exam: Vitals:   04/17/22 2340 04/18/22 0347 04/18/22 0500 04/18/22 0749  BP: (!) 115/58 113/61  126/66  Pulse: 62 60  63  Resp: '18 16  19  '$ Temp: 97.9 F (36.6 C) 98.2 F (36.8 C)  98.7 F (37.1 C)  TempSrc: Oral     SpO2: 93% 95%  96%  Weight:   94.7 kg   Height:       General exam: Appears calm and comfortable  Respiratory system: Decreased breathing sounds, labored breathing when talking Cardiovascular system: S1 & S2 heard, RRR. No JVD, murmurs, rubs, gallops or clicks. No pedal edema. Gastrointestinal system: Abdomen is nondistended, soft and nontender. No organomegaly or masses felt. Normal bowel sounds heard. Central nervous system: Alert and oriented x2. No focal neurological deficits. Extremities: Symmetric 5 x 5 power. Skin: No rashes, lesions or ulcers    Data Reviewed:  Lab results reviewed  Family Communication: Daughters updated at bedside  Disposition: Status is: Inpatient Remains inpatient appropriate because: Severity of disease.  Planned Discharge Destination: Skilled nursing facility    Time spent: 35 minutes  Author: Sharen Hones, MD 04/18/2022 10:33 AM  For on call review [email or web address].

## 2022-04-18 NOTE — Progress Notes (Signed)
PHARMACY NOTE:  ANTIMICROBIAL RENAL DOSAGE ADJUSTMENT  Current antimicrobial regimen includes a mismatch between antimicrobial dosage and estimated renal function.  As per policy approved by the Pharmacy & Therapeutics and Medical Executive Committees, the antimicrobial dosage will be adjusted accordingly.  Current antimicrobial dosage:  Augmentin '875mg'$  BID & Doxy 100 BID  Indication: Foot ulcer with chronic osteomyelitis of right foot distal fifth metatarsal head and neck  Renal Function:  Estimated Creatinine Clearance: 28.7 mL/min (A) (by C-G formula based on SCr of 1.58 mg/dL (H)).    Antimicrobial dosage has been changed to:  Augmentin '500mg'$  BID  Additional comments: Continues doxy no adjust needed   Thank you for allowing pharmacy to be a part of this patient's care.  Lorna Dibble, [facility] 04/18/2022 10:42 AM

## 2022-04-18 NOTE — TOC Progression Note (Signed)
Transition of Care [facility]) - Progression Note    Patient Details  Name: LENIX KIDD MRN: [id number] Date of Birth: 11-Apr-1919  Transition of Care [facility]) CM/SW Contact  Laurena Slimmer, RN Phone Number: 04/18/2022, 12:15 PM  Clinical Narrative:    Spoke with son in patient's room to advised of bed offer at [facility]. Son requested to speak with Becky May, Patient's daughter. Attempt to reach Becky May by phone. No answer. Left a message requesting return call.      Barriers to Discharge: Continued Medical Work up  Expected Discharge Plan and Services                                                 Social Determinants of Health (SDOH) Interventions    Readmission Risk Interventions     View : No data to display.

## 2022-04-19 DIAGNOSIS — N170 Acute kidney failure with tubular necrosis: Secondary | ICD-10-CM | POA: Diagnosis not present

## 2022-04-19 DIAGNOSIS — G9341 Metabolic encephalopathy: Secondary | ICD-10-CM | POA: Diagnosis not present

## 2022-04-19 DIAGNOSIS — M86171 Other acute osteomyelitis, right ankle and foot: Secondary | ICD-10-CM | POA: Diagnosis not present

## 2022-04-19 DIAGNOSIS — J9601 Acute respiratory failure with hypoxia: Secondary | ICD-10-CM | POA: Diagnosis not present

## 2022-04-19 MED ORDER — POLYETHYLENE GLYCOL 3350 17 G PO PACK: 17.0000 g | PACK | Freq: Every day | ORAL | Status: DC | PRN

## 2022-04-19 MED ORDER — ACETAMINOPHEN 325 MG PO TABS
650.0000 mg | ORAL_TABLET | Freq: Four times a day (QID) | ORAL | Status: DC | PRN
  Administered 2022-04-19 – 2022-04-20 (×2): 650 mg via ORAL
  Filled 2022-04-19 (×2): qty 2

## 2022-04-19 NOTE — Progress Notes (Signed)
  Progress Note   Patient: [name] QMV:[id number] DOB: 10/16/1919 DOA: 04/12/2022     7 DOS: the patient was seen and examined on 04/19/2022   Brief hospital course:   88 year old male with hypothyroidism, congestive heart failure, CKD with baseline creatinine IV 1.96-2.19 hypertension persistent A-fib with pacer due to complete heart block, on Xarelto, wound on the right foot who lives at home by himself but family nearby brought to the ED by the daughter due to altered mental status.  Patient had delirium secondary to sleep deprivation.  He also developed aspiration pneumonia with acute hypoxemic respiratory failure.  He was treated with the Seroquel and melatonin, mental status much improved.  He has since off oxygen, was able to ambulate with the physical therapist.  Discussed with podiatry, patient will need oral antibiotics for 2 weeks for chronic osteomyelitis.  Currently pending nursing placement   Assessment and Plan: Acute metabolic encephalopathy. Sleep deprivation. Anorexia Patient mental status has back to baseline.  He slept well at night and no agitation today time.  He was able to walk with the physical therapist.  His appetite is also improving after giving megestrol.     Acute hypoxemic respiratory failure. Bilateral aspiration pneumonia. Condition improved, off oxygen.  Completed 5 days antibiotics.  Currently on antibiotics for chronic osteomyelitis   Acute kidney injury superimposed on chronic kidney disease stage IV. Metabolic acidosis. Renal function has back to baseline.   Foot ulcer with chronic osteomyelitis of right foot distal fifth metatarsal head and neck. This is not the source of metabolic encephalopathy. Discussed with Dr. Amalia Hailey today, it is okay to treat the patient with additional 2 weeks of Augmentin and doxycycline.   Urinary retention due to benign prostate hypertrophy Constipation. Foley catheter was re-anchored 5/21, will need to keep it  for at least 2 weeks for now.  Continue Flomax.  Essential hypertension. Blood pressure stable.      Subjective:  Patient doing much better, no confusion, appetite improving.  Mobility also improving with PT.  Physical Exam: Vitals:   04/19/22 0338 04/19/22 0636 04/19/22 0803 04/19/22 1257  BP: 117/62  122/67 117/67  Pulse: 63  65 64  Resp: '18  18 18  '$ Temp: 98.1 F (36.7 C)  98 F (36.7 C) 98 F (36.7 C)  TempSrc: Oral  Oral   SpO2: 94%  94% 95%  Weight:  92.6 kg    Height:       General exam: Appears calm and comfortable  Respiratory system: Clear to auscultation. Respiratory effort normal. Cardiovascular system: S1 & S2 heard, RRR. No JVD, murmurs, rubs, gallops or clicks. No pedal edema. Gastrointestinal system: Abdomen is nondistended, soft and nontender. No organomegaly or masses felt. Normal bowel sounds heard. Central nervous system: Alert and oriented x3.  No focal neurological deficits. Extremities: Symmetric 5 x 5 power. Skin: No rashes, lesions or ulcers Psychiatry: Judgement and insight appear normal. Mood & affect appropriate.   Data Reviewed:  Lab results reviewed.  Family Communication: Daughter was updated at the bedside.  Disposition: Status is: Inpatient Remains inpatient appropriate because: Unsafe discharge, pending nursing home placement.  Patient is medically stable to be discharged  Planned Discharge Destination: Skilled nursing facility    Time spent: 35 minutes  Author: Sharen Hones, MD 04/19/2022 2:55 PM  For on call review [email or web address].

## 2022-04-19 NOTE — TOC Progression Note (Addendum)
Transition of Care [facility]) - Progression Note    Patient Details  Name: [name] MRN: [id number] Date of Birth: 11/21/1919  [facility]) CM/SW Contact  Laurena Slimmer, RN Phone Number: 04/19/2022, 11:32 AM  Clinical Narrative:         Barriers to Discharge: Continued Medical Work up  Expected Discharge Plan and Services  Spoke with Becky May in the room. Becky May request clinicals be sent to [facility] for bed offer. Bed search expanded for [facility]. Called Admission Director Lynwood [facility] and left a message regarding incoming clinical for referral.                                              Social Determinants of Health (SDOH) Interventions    Readmission Risk Interventions     View : No data to display.

## 2022-04-19 NOTE — Plan of Care (Signed)
  Problem: Clinical Measurements: Goal: Ability to maintain clinical measurements within normal limits will improve Outcome: Progressing   Problem: Clinical Measurements: Goal: Will remain free from infection Outcome: Progressing   

## 2022-04-19 NOTE — Progress Notes (Signed)
   PODIATRY PROGRESS NOTE  NAME [name] MRN [id number] DOB 05/28/1919 DOA 04/12/2022   CC: Osteomyelitis right foot Chief Complaint  Patient presents with   Altered Mental Status   88 year old male PMHx CHF, CKD, HTN, persistent A-fib with pacemaker, osteomyelitis right foot seen today regarding chronic osteomyelitis of the right foot.  Patient resting comfortably in his recliner today upon presentation with family present.  Patient doing well.  Dressing changes are daily with Aquacel and DSD.  Currently on combination Augmentin and doxycycline.  Family has no new complaints or questions regarding the chronic ulcer of the right foot  Past Medical History:  Diagnosis Date   Acid reflux    Cancer ([place]) 09/05/13   left neck-non-hodgkins lymphoma   CKD (chronic kidney disease), stage IV ([place]) 11/07/2018   Hypertension    requires diuretic   Hypothyroidism    Malaria    while in the service   Peripheral vascular disease ([place])    slow wound healing on legs       Latest Ref Rng & Units 04/18/2022    2:04 AM 04/14/2022    9:12 AM 04/13/2022    3:51 AM  CBC  WBC 4.0 - 10.5 K/uL 5.1   13.2   6.0    Hemoglobin 13.0 - 17.0 g/dL 10.9   12.7   12.0    Hematocrit 39.0 - 52.0 % 34.2   39.8   37.3    Platelets 150 - 400 K/uL 142   159   158         Latest Ref Rng & Units 04/18/2022    2:04 AM 04/17/2022    5:42 AM 04/16/2022    5:36 AM  BMP  Glucose 70 - 99 mg/dL 111   103   99    BUN 8 - 23 mg/dL 36   37   37    Creatinine 0.61 - 1.24 mg/dL 1.58   1.70   1.89    Sodium 135 - 145 mmol/L 142   142   137    Potassium 3.5 - 5.1 mmol/L 4.2   4.3   4.1    Chloride 98 - 111 mmol/L 110   110   107    CO2 22 - 32 mmol/L '24   23   20    '$ Calcium 8.9 - 10.3 mg/dL 8.9   8.7   8.6          ASSESSMENT/PLAN OF CARE Chronic osteomyelitis right foot -Family in agreement we we will continue conservative care plan nonsurgical management -Continue oral antibiotics Augmentin and  doxycycline -Continue daily dressing changes consisting of Aquacel Ag and dry sterile dressings -Planning for discharge to rehab facility -Podiatry to sign off.  Resume outpatient wound care at the [facility] wound care center where the patient is already established    Edrick Kins, DPM [facility]  Dr. Edrick Kins, DPM    [address], [place] [address]                Office [phone]  Fax [phone]

## 2022-04-19 NOTE — Plan of Care (Signed)
  Problem: Clinical Measurements: Goal: Will remain free from infection Outcome: Progressing   Problem: Clinical Measurements: Goal: Respiratory complications will improve Outcome: Progressing   Problem: Clinical Measurements: Goal: Cardiovascular complication will be avoided Outcome: Progressing   Problem: Elimination: Goal: Will not experience complications related to bowel motility Outcome: Progressing   Problem: Elimination: Goal: Will not experience complications related to urinary retention Outcome: Progressing   Problem: Pain Managment: Goal: General experience of comfort will improve Outcome: Progressing   Problem: Safety: Goal: Ability to remain free from injury will improve Outcome: Progressing

## 2022-04-19 NOTE — Progress Notes (Signed)
Physical Therapy Treatment Patient Details Name: [name] MRN: [id number] DOB: July 05, 1919 Today's Date: 04/19/2022   History of Present Illness 88 year old male with hypothyroidism, congestive heart failure, CKD, hypertension persistent A-fib with pacer due to complete heart block, on Xarelto, wound on the right foot who is seen at wound care clinic.  He lives at home "alone" but family nearby and around most of the time.  Brought to the ED by daughter due to altered mental status.    PT Comments    Pt is making good progress towards goals with ability to ambulate short distance to recliner this date. IMproved functional independence noted with decreased physical assist required. Follows commands, however continues to be off baseline. Weaned to RA with sats WNL with exertion. Updated whiteboard on current mobility status. Left with family in room. WIll continue to progress as able.   Recommendations for follow up therapy are one component of a multi-disciplinary discharge planning process, led by the attending physician.  Recommendations may be updated based on patient status, additional functional criteria and insurance authorization.  Follow Up Recommendations  Skilled nursing-short term rehab (<3 hours/day)     Assistance Recommended at Discharge Frequent or constant Supervision/Assistance  Patient can return home with the following A lot of help with walking and/or transfers;A lot of help with bathing/dressing/bathroom;Help with stairs or ramp for entrance   Equipment Recommendations   (TBD)    Recommendations for Other Services       Precautions / Restrictions Precautions Precautions: Fall Restrictions Weight Bearing Restrictions: Yes RLE Weight Bearing: Weight bearing as tolerated     Mobility  Bed Mobility Overal bed mobility: Needs Assistance Bed Mobility: Supine to Sit     Supine to sit: Mod assist     General bed mobility comments: safe technique and able to  follow commands. ONce seated, upright posture. All mobility performed on RA with sats WNL.    Transfers Overall transfer level: Needs assistance Equipment used: Rolling walker (2 wheels) Transfers: Sit to/from Stand Sit to Stand: Min assist           General transfer comment: able to stand from slightly elevated bed with cues for hand placement. Slightly impulsive and tries to stand prior to therapist consent. Once standing, upright posture noted    Ambulation/Gait Ambulation/Gait assistance: Min assist Gait Distance (Feet): 3 Feet Assistive device: Rolling walker (2 wheels) Gait Pattern/deviations: Step-to pattern       General Gait Details: able to take steps over to recliner with good weight acceptance on R LE. RW used and follows commands easily. O2 sats at 94% on RA with exertion   Stairs             Wheelchair Mobility    Modified Rankin (Stroke Patients Only)       Balance Overall balance assessment: Needs assistance Sitting-balance support: Bilateral upper extremity supported, Feet supported Sitting balance-Leahy Scale: Good     Standing balance support: Bilateral upper extremity supported, During functional activity, Reliant on assistive device for balance Standing balance-Leahy Scale: Fair                              Cognition Arousal/Alertness: Awake/alert Behavior During Therapy: WFL for tasks assessed/performed Overall Cognitive Status: Impaired/Different from baseline  General Comments: improved cognition, alert to place and time. confused to situation. Per review, was agitated a few nights ago        Exercises Other Exercises Other Exercises: supine ther-ex performed on B LE including quad sets, SLRs, and hip abd/add. 10 reps with min assist. Also performed B UE shoulder raises and abduction. 10 reps with supervision    General Comments        Pertinent Vitals/Pain Pain  Assessment Pain Assessment: No/denies pain    Home Living                          Prior Function            PT Goals (current goals can now be found in the care plan section) Acute Rehab PT Goals Patient Stated Goal: none stated PT Goal Formulation: With patient Time For Goal Achievement: 04/28/22 Potential to Achieve Goals: Fair Progress towards PT goals: Progressing toward goals    Frequency    Min 2X/week      PT Plan Current plan remains appropriate    Co-evaluation              AM-PAC PT "6 Clicks" Mobility   Outcome Measure  Help needed turning from your back to your side while in a flat bed without using bedrails?: A Little Help needed moving from lying on your back to sitting on the side of a flat bed without using bedrails?: A Little Help needed moving to and from a bed to a chair (including a wheelchair)?: A Little Help needed standing up from a chair using your arms (e.g., wheelchair or bedside chair)?: A Little Help needed to walk in hospital room?: A Lot Help needed climbing 3-5 steps with a railing? : Total 6 Click Score: 15    End of Session Equipment Utilized During Treatment: Gait belt Activity Tolerance: Patient tolerated treatment well Patient left: in chair;with chair alarm set;with family/visitor present Nurse Communication: Mobility status PT Visit Diagnosis: Muscle weakness (generalized) (M62.81);Difficulty in walking, not elsewhere classified (R26.2)     Time: 1026-1050 PT Time Calculation (min) (ACUTE ONLY): 24 min  Charges:  $Gait Training: 8-22 mins $Therapeutic Exercise: 8-22 mins                     Greggory Stallion, PT, DPT, GCS [phone]    [name] 04/19/2022, 1:03 PM

## 2022-04-19 NOTE — TOC Progression Note (Signed)
Transition of Care [facility] - Progression Note    Patient Details  Name: [name] MRN: [id number] Date of Birth: 10/13/1919  Transition of Care [facility]) CM/SW Contact  Laurena Slimmer, RN Phone Number: 04/19/2022, 1:34 PM  Clinical Narrative:    Spoke with Admissions Director at [facility], No beds available. Contacted Patient's daughter, [name] May to advised of no bed offer  at [facility] and acceptance at [facility] as discussed earlier today.      Barriers to Discharge: Continued Medical Work up  Expected Discharge Plan and Services                                                 Social Determinants of Health (SDOH) Interventions    Readmission Risk Interventions     View : No data to display.

## 2022-04-20 DIAGNOSIS — M86171 Other acute osteomyelitis, right ankle and foot: Secondary | ICD-10-CM | POA: Diagnosis not present

## 2022-04-20 DIAGNOSIS — Z7189 Other specified counseling: Secondary | ICD-10-CM | POA: Diagnosis not present

## 2022-04-20 MED ORDER — QUETIAPINE FUMARATE 25 MG PO TABS
25.0000 mg | ORAL_TABLET | Freq: Every evening | ORAL | Status: DC | PRN
  Administered 2022-04-20: 25 mg via ORAL
  Filled 2022-04-20: qty 1

## 2022-04-20 MED ORDER — ENSURE ENLIVE PO LIQD
237.0000 mL | Freq: Three times a day (TID) | ORAL | Status: DC
  Administered 2022-04-20 – 2022-04-21 (×2): 237 mL via ORAL

## 2022-04-20 NOTE — Progress Notes (Signed)
  Progress Note   Patient: [name] [id number]:[id number] DOB: 22-Mar-1919 DOA: 04/12/2022     8 DOS: the patient was seen and examined on 04/20/2022   Brief hospital course:   88 year old male with hypothyroidism, congestive heart failure, CKD with baseline creatinine IV 1.96-2.19 hypertension persistent A-fib with pacer due to complete heart block, on Xarelto, wound on the right foot who lives at home by himself but family nearby brought to the ED by the daughter due to altered mental status.  Patient had delirium secondary to sleep deprivation.  He also developed aspiration pneumonia with acute hypoxemic respiratory failure.  He was treated with the Seroquel and melatonin, mental status much improved.  He has since off oxygen, was able to ambulate with the physical therapist.  Discussed with podiatry, patient will need oral antibiotics for 2 weeks for chronic osteomyelitis.  Currently pending nursing placement   Assessment and Plan: Acute metabolic encephalopathy. Sleep deprivation. Anorexia Patient mental status has back to baseline.  His appetite is also improving after giving megestrol. --change seroqeul 25 mg nightly from scheduled to PRN --cont Megace   Acute hypoxemic respiratory failure. Bilateral aspiration pneumonia. Condition improved, off oxygen.  Completed 5 days antibiotics.     Acute kidney injury superimposed on chronic kidney disease stage IV. Metabolic acidosis. Renal function has back to baseline. --d/c Na bicarb today.   Foot ulcer with chronic osteomyelitis of right foot distal fifth metatarsal head and neck. This is not the source of metabolic encephalopathy. Discussed with Dr. Amalia Hailey, it is okay to treat the patient with additional 2 weeks of Augmentin and doxycycline. --outpatient f/u with podiatry   Urinary retention due to benign prostate hypertrophy Constipation. Foley catheter was re-anchored 5/21, will need to keep it for at least 2 weeks for now.  --cont  Flomax   Essential hypertension, not currently active --BP wnl without BP meds      Subjective:  Pt had no complaint, was very appreciative of the nursing care he has been receiving.   Physical Exam: Vitals:   04/20/22 0452 04/20/22 0900 04/20/22 1104 04/20/22 1334  BP:  127/60 124/72   Pulse:  62 64   Resp:      Temp:  97.7 F (36.5 C) 98.2 F (36.8 C)   TempSrc:   Oral   SpO2:  96% 95% 96%  Weight: 90.7 kg     Height:        Constitutional: NAD, AAOx3, sitting in recliner HEENT: conjunctivae normal, erythema around the lower eye lids, EOMI CV: No cyanosis.   RESP: normal respiratory effort, on RA Extremities: right foot wrapped SKIN: warm, dry Psych: Normal mood and affect.     Data Reviewed:  Lab results reviewed.  Family Communication: daughter updated at bedside today  Disposition: Status is: Inpatient   Planned Discharge Destination: Skilled nursing facility    Time spent: 35 minutes  Author: Enzo Bi, MD 04/20/2022 6:39 PM  For on call review [email or web address].

## 2022-04-20 NOTE — Progress Notes (Signed)
Occupational Therapy Treatment Patient Details Name: [name] MRN: [id number] DOB: 10-19-1919 Today's Date: 04/20/2022   History of present illness 88 year old male with hypothyroidism, congestive heart failure, CKD, hypertension persistent A-fib with pacer due to complete heart block, on Xarelto, wound on the right foot who is seen at wound care clinic.  He lives at home "alone" but family nearby and around most of the time.  Brought to the ED by daughter due to altered mental status.   OT comments  Chart reviewed, pt greeted in bed requesting to get out of bed to chair. Pt is alert, oriented to self, place, not oriented to date (however identifies it is Wednesday in 2023), grossly oriented to situation. Tx session targeted progressing functional mobility and activity tolerance. Improvements noted in tolerance and participation in bed mobility, transfer on this date. Pt will continue to benefit from STR to address deficits. OT will continue to follow acutely.    Recommendations for follow up therapy are one component of a multi-disciplinary discharge planning process, led by the attending physician.  Recommendations may be updated based on patient status, additional functional criteria and insurance authorization.    Follow Up Recommendations  Skilled nursing-short term rehab (<3 hours/day)    Assistance Recommended at Discharge Frequent or constant Supervision/Assistance  Patient can return home with the following  Two people to help with walking and/or transfers;Two people to help with bathing/dressing/bathroom;Direct supervision/assist for medications management;Help with stairs or ramp for entrance;Assist for transportation;Assistance with feeding;Direct supervision/assist for financial management;Assistance with cooking/housework   Equipment Recommendations  Other (comment) (per next venue of care)    Recommendations for Other Services      Precautions / Restrictions  Precautions Precautions: Fall Restrictions Weight Bearing Restrictions: Yes RLE Weight Bearing: Weight bearing as tolerated       Mobility Bed Mobility Overal bed mobility: Needs Assistance Bed Mobility: Supine to Sit Rolling: Min assist              Transfers                         Balance Overall balance assessment: Needs assistance Sitting-balance support: Bilateral upper extremity supported, Feet supported Sitting balance-Leahy Scale: Good     Standing balance support: Bilateral upper extremity supported, During functional activity, Reliant on assistive device for balance Standing balance-Leahy Scale: Fair                             ADL either performed or assessed with clinical judgement   ADL Overall ADL's : Needs assistance/impaired                                       General ADL Comments: supine>sit with MIN A, STS with RW with MIN-MOD A, short amb transfer to bedside chair with MIN A with RW, step by step vcs; peri care with TOTAL A    Extremity/Trunk Assessment              Vision       Perception     Praxis      Cognition Arousal/Alertness: Awake/alert Behavior During Therapy: WFL for tasks assessed/performed Overall Cognitive Status: Impaired/Different from baseline Area of Impairment: Attention, Safety/judgement, Awareness, Problem solving, Orientation  Orientation Level: Disoriented to, Time Current Attention Level: Sustained Memory: Decreased recall of precautions, Decreased short-term memory Following Commands: Follows one step commands with increased time Safety/Judgement: Decreased awareness of safety, Decreased awareness of deficits Awareness: Emergent Problem Solving: Slow processing, Decreased initiation, Difficulty sequencing, Requires verbal cues, Requires tactile cues          Exercises      Shoulder Instructions       General Comments spo2>90%  throughout mobility    Pertinent Vitals/ Pain       Pain Assessment Pain Assessment: No/denies pain  Home Living                                          Prior Functioning/Environment              Frequency  Min 2X/week        Progress Toward Goals  OT Goals(current goals can now be found in the care plan section)  Progress towards OT goals: Progressing toward goals     Plan Discharge plan remains appropriate    Co-evaluation                 AM-PAC OT "6 Clicks" Daily Activity     Outcome Measure   Help from another person eating meals?: A Little Help from another person taking care of personal grooming?: A Lot Help from another person toileting, which includes using toliet, bedpan, or urinal?: Total Help from another person bathing (including washing, rinsing, drying)?: A Lot Help from another person to put on and taking off regular upper body clothing?: A Lot Help from another person to put on and taking off regular lower body clothing?: A Lot 6 Click Score: 12    End of Session Equipment Utilized During Treatment: Gait belt;Rolling walker (2 wheels)  OT Visit Diagnosis: Other abnormalities of gait and mobility (R26.89)   Activity Tolerance Patient tolerated treatment well   Patient Left with nursing/sitter in room;with family/visitor present;in chair;with chair alarm set;with call bell/phone within reach   Nurse Communication Mobility status        Time: 5885-0277 OT Time Calculation (min): 8 min  Charges: OT General Charges $OT Visit: 1 Visit OT Treatments $Self Care/Home Management : 8-22 mins Shanon Payor, OTD OTR/L  04/20/22, 1:45 PM

## 2022-04-20 NOTE — TOC Progression Note (Signed)
Transition of Care [facility]) - Progression Note    Patient Details  Name: [name] MRN: [id number] Date of Birth: 07-15-1919  [facility] CM/SW [place] Phone Number: 04/20/2022, 2:21 PM  Clinical Narrative:     CSW spoke with patient's daughter [name] who reports being in agreement with [facility] SNF for patient.   Insurance authorization has been started with [facility], pending auth at this time.   Janie at [facility] Admissions has been updated on above.     Barriers to Discharge: Continued Medical Work up  Expected Discharge Plan and Services                                                 Social Determinants of Health (SDOH) Interventions    Readmission Risk Interventions     View : No data to display.

## 2022-04-20 NOTE — Progress Notes (Signed)
Nutrition Follow-up  DOCUMENTATION CODES:   Not applicable  INTERVENTION:   -Increase Ensure Enlive po to TID, each supplement provides 350 kcal and 20 grams of protein -Continue MVI with minerals daily -Continue 500 mg vitamin C BID -Continue 220 mg zinc sulfate daily x 14 days  NUTRITION DIAGNOSIS:   Inadequate oral intake related to inability to eat as evidenced by NPO status.  Ongoing; progressing advanced to PO diet on 04/15/22  GOAL:   Patient will meet greater than or equal to 90% of their needs  Progressing   MONITOR:   PO intake, Supplement acceptance, Diet advancement  REASON FOR ASSESSMENT:   Consult Wound healing  ASSESSMENT:   Pt with medical history significant  of Htn and pacemaker for CHB and atrial fibrillation not a candidate for [facility] admitted for osteomeylitis.  5/20- s/p BSE- advanced to dysphagia 3 diet with thin liquids  Reviewed I/O's: -220 ml x 24 hours and -4.8 L since admission  UOP: 700 ml x 24 hours   Pt sleeping soundly at time of visit. No family at bedside.   Pt with variable intake. Noted meal completions 20-100%. Pt is drinking Ensure supplements.   Per chart review, pt's mental status is back to baseline.   Plan to discharge to SNF once medically stable.   Labs reviewed.   Diet Order:   Diet Order             DIET DYS 3 Room service appropriate? Yes with Assist; Fluid consistency: Thin  Diet effective now                   EDUCATION NEEDS:   Education needs have been addressed  Skin:  Skin Assessment: Skin Integrity Issues: Skin Integrity Issues:: Other (Comment) Other: chronic full thickness wound to right outer foot, skin tear to rt pretibial  Last BM:  04/18/22  Height:   Ht Readings from Last 1 Encounters:  04/16/22 '5\' 10"'$  (1.778 m)    Weight:   Wt Readings from Last 1 Encounters:  04/20/22 90.7 kg    Ideal Body Weight:  83.6 kg  BMI:  Body mass index is 28.69 kg/m.  Estimated Nutritional  Needs:   Kcal:  2050-2250  Protein:  105-120 grams  Fluid:  > 2 L    Loistine Chance, RD, LDN, [place] Registered Dietitian II Certified Diabetes Care and Education Specialist Please refer to [facility] for RD and/or RD on-call/weekend/after hours pager

## 2022-04-20 NOTE — Progress Notes (Signed)
PT Cancellation Note  Patient Details Name: [name] MRN: [id number] DOB: [birth year]   Cancelled Treatment:    Reason Eval/Treat Not Completed: Other (comment). Pt alert, in bed with family at bedside. Declined PT at this time, stated he just returned to bed (had worked with OT earlier today and had been sitting up in the chair). PT to re-attempt as able.    [name] PT, DPT 3:50 PM,04/20/22

## 2022-04-20 NOTE — Progress Notes (Signed)
Mobility Specialist - Progress Note   04/20/22 1700  Mobility  Activity Ambulated with assistance in room  Level of Assistance Minimal assist, patient does 75% or more  Assistive Device Front wheel walker  Distance Ambulated (ft) 8 ft  Activity Response Tolerated well  $Mobility charge 1 Mobility     Pt lying in bed upon arrival, utilizing RA. ModA to stand from bedside. Pt ambulated fwd/retro with minA---with verbal/tactile cueing for retro-stepping with RW. Mostly follows commands. Pt very fatigued from activity but able to increase distance this date. O2 maintained in 90s throughout activity. Pt returned supine with alarm set, needs in reach, family at bedside.    Kathee Delton Mobility Specialist 04/20/22, 5:16 PM

## 2022-04-21 DIAGNOSIS — K219 Gastro-esophageal reflux disease without esophagitis: Secondary | ICD-10-CM | POA: Diagnosis not present

## 2022-04-21 DIAGNOSIS — G629 Polyneuropathy, unspecified: Secondary | ICD-10-CM | POA: Diagnosis not present

## 2022-04-21 DIAGNOSIS — R278 Other lack of coordination: Secondary | ICD-10-CM | POA: Diagnosis not present

## 2022-04-21 DIAGNOSIS — Z741 Need for assistance with personal care: Secondary | ICD-10-CM | POA: Diagnosis not present

## 2022-04-21 DIAGNOSIS — E46 Unspecified protein-calorie malnutrition: Secondary | ICD-10-CM | POA: Diagnosis not present

## 2022-04-21 DIAGNOSIS — R2681 Unsteadiness on feet: Secondary | ICD-10-CM | POA: Diagnosis not present

## 2022-04-21 DIAGNOSIS — R293 Abnormal posture: Secondary | ICD-10-CM | POA: Diagnosis not present

## 2022-04-21 DIAGNOSIS — E039 Hypothyroidism, unspecified: Secondary | ICD-10-CM | POA: Diagnosis not present

## 2022-04-21 DIAGNOSIS — I5022 Chronic systolic (congestive) heart failure: Secondary | ICD-10-CM | POA: Diagnosis not present

## 2022-04-21 DIAGNOSIS — M86171 Other acute osteomyelitis, right ankle and foot: Secondary | ICD-10-CM | POA: Diagnosis not present

## 2022-04-21 DIAGNOSIS — R339 Retention of urine, unspecified: Secondary | ICD-10-CM | POA: Diagnosis not present

## 2022-04-21 DIAGNOSIS — I87393 Chronic venous hypertension (idiopathic) with other complications of bilateral lower extremity: Secondary | ICD-10-CM | POA: Diagnosis not present

## 2022-04-21 DIAGNOSIS — G479 Sleep disorder, unspecified: Secondary | ICD-10-CM | POA: Diagnosis not present

## 2022-04-21 DIAGNOSIS — L89890 Pressure ulcer of other site, unstageable: Secondary | ICD-10-CM | POA: Diagnosis not present

## 2022-04-21 DIAGNOSIS — G894 Chronic pain syndrome: Secondary | ICD-10-CM | POA: Diagnosis not present

## 2022-04-21 DIAGNOSIS — G9341 Metabolic encephalopathy: Secondary | ICD-10-CM | POA: Diagnosis not present

## 2022-04-21 DIAGNOSIS — J698 Pneumonitis due to inhalation of other solids and liquids: Secondary | ICD-10-CM | POA: Diagnosis not present

## 2022-04-21 DIAGNOSIS — M869 Osteomyelitis, unspecified: Secondary | ICD-10-CM | POA: Diagnosis not present

## 2022-04-21 DIAGNOSIS — I10 Essential (primary) hypertension: Secondary | ICD-10-CM | POA: Diagnosis not present

## 2022-04-21 DIAGNOSIS — M6281 Muscle weakness (generalized): Secondary | ICD-10-CM | POA: Diagnosis not present

## 2022-04-21 DIAGNOSIS — J9601 Acute respiratory failure with hypoxia: Secondary | ICD-10-CM | POA: Diagnosis not present

## 2022-04-21 DIAGNOSIS — I509 Heart failure, unspecified: Secondary | ICD-10-CM | POA: Diagnosis not present

## 2022-04-21 DIAGNOSIS — Z7401 Bed confinement status: Secondary | ICD-10-CM | POA: Diagnosis not present

## 2022-04-21 DIAGNOSIS — R262 Difficulty in walking, not elsewhere classified: Secondary | ICD-10-CM | POA: Diagnosis not present

## 2022-04-21 DIAGNOSIS — J810 Acute pulmonary edema: Secondary | ICD-10-CM | POA: Diagnosis not present

## 2022-04-21 DIAGNOSIS — N139 Obstructive and reflux uropathy, unspecified: Secondary | ICD-10-CM | POA: Diagnosis not present

## 2022-04-21 DIAGNOSIS — J690 Pneumonitis due to inhalation of food and vomit: Secondary | ICD-10-CM | POA: Diagnosis not present

## 2022-04-21 DIAGNOSIS — R338 Other retention of urine: Secondary | ICD-10-CM | POA: Diagnosis not present

## 2022-04-21 DIAGNOSIS — N390 Urinary tract infection, site not specified: Secondary | ICD-10-CM | POA: Diagnosis not present

## 2022-04-21 DIAGNOSIS — R29898 Other symptoms and signs involving the musculoskeletal system: Secondary | ICD-10-CM | POA: Diagnosis not present

## 2022-04-21 DIAGNOSIS — N184 Chronic kidney disease, stage 4 (severe): Secondary | ICD-10-CM | POA: Diagnosis not present

## 2022-04-21 DIAGNOSIS — L97519 Non-pressure chronic ulcer of other part of right foot with unspecified severity: Secondary | ICD-10-CM | POA: Diagnosis not present

## 2022-04-21 DIAGNOSIS — R531 Weakness: Secondary | ICD-10-CM | POA: Diagnosis not present

## 2022-04-21 DIAGNOSIS — I517 Cardiomegaly: Secondary | ICD-10-CM | POA: Diagnosis not present

## 2022-04-21 DIAGNOSIS — I4891 Unspecified atrial fibrillation: Secondary | ICD-10-CM | POA: Diagnosis not present

## 2022-04-21 MED ORDER — FUROSEMIDE 20 MG PO TABS: ORAL_TABLET | ORAL | 3 refills | Status: DC

## 2022-04-21 MED ORDER — TAMSULOSIN HCL 0.4 MG PO CAPS: 0.4000 mg | ORAL_CAPSULE | Freq: Every day | ORAL | Status: DC

## 2022-04-21 MED ORDER — MEGESTROL ACETATE 400 MG/10ML PO SUSP: 400.0000 mg | Freq: Every day | ORAL | 0 refills | Status: DC

## 2022-04-21 MED ORDER — MELATONIN 5 MG PO TABS: 5.0000 mg | ORAL_TABLET | Freq: Every day | ORAL | 0 refills | Status: DC

## 2022-04-21 MED ORDER — ENSURE ENLIVE PO LIQD: 237.0000 mL | Freq: Three times a day (TID) | ORAL | Status: DC

## 2022-04-21 MED ORDER — QUETIAPINE FUMARATE 25 MG PO TABS: 25.0000 mg | ORAL_TABLET | Freq: Every evening | ORAL | Status: DC | PRN

## 2022-04-21 MED ORDER — DOXYCYCLINE HYCLATE 100 MG PO TABS: 100.0000 mg | ORAL_TABLET | Freq: Two times a day (BID) | ORAL | 0 refills | Status: AC

## 2022-04-21 MED ORDER — AMOXICILLIN-POT CLAVULANATE 500-125 MG PO TABS: 1.0000 | ORAL_TABLET | Freq: Two times a day (BID) | ORAL | 0 refills | Status: AC

## 2022-04-21 NOTE — Progress Notes (Signed)
Daily Progress Note   Patient Name: [name]       Date: 04/21/2022 DOB: 1919/04/19  Age: 88 y.o. MRN#: [id number] Attending Physician: Enzo Bi, MD Primary Care Physician: Susy Frizzle, MD Admit Date: 04/12/2022  Reason for Consultation/Follow-up: Establishing goals of care  Subjective: Notes reviewed. In to see patient and daughter who is HPOA is at bedside. Patient denies complaint.  Daughter states her father's status has improved and he is eating and drinking well. She tells me he should be going to rehab tomorrow. Discussed different scenarios. Discussed potential decline and transition to comfort if this occurs. Recommend outpatient palliative.  Length of Stay: 9  Current Medications: Scheduled Meds:   amoxicillin-clavulanate  1 tablet Oral BID   aspirin EC  81 mg Oral Daily   chlorhexidine  15 mL Mouth Rinse BID   Chlorhexidine Gluconate Cloth  6 each Topical Daily   doxycycline  100 mg Oral Q12H   feeding supplement  237 mL Oral TID BM   heparin  5,000 Units Subcutaneous Q8H   levothyroxine  112 mcg Oral Daily   mouth rinse  15 mL Mouth Rinse q12n4p   megestrol  400 mg Oral Daily   melatonin  2.5 mg Oral QHS   sodium chloride flush  3 mL Intravenous Q12H   tamsulosin  0.4 mg Oral QPC supper    Continuous Infusions:   PRN Meds: acetaminophen, albuterol, hydrALAZINE, ipratropium-albuterol, polyethylene glycol, QUEtiapine  Physical Exam Pulmonary:     Effort: Pulmonary effort is normal.  Neurological:     Mental Status: He is alert.            Vital Signs: BP 105/64 (BP Location: Right Arm)   Pulse 78   Temp 98 F (36.7 C) (Oral)   Resp 17   Ht '5\' 10"'$  (1.778 m)   Wt 91.3 kg   SpO2 96%   BMI 28.88 kg/m  SpO2: SpO2: 96 % O2 Device: O2 Device: Room  Air O2 Flow Rate: O2 Flow Rate (L/min): 3 L/min  Intake/output summary:  Intake/Output Summary (Last 24 hours) at 04/21/2022 0916 Last data filed at 04/21/2022 0750 Gross per 24 hour  Intake --  Output 1450 ml  Net -1450 ml   LBM: Last BM Date : 04/20/22 Baseline Weight: Weight: 89.4 kg (02/2022 weight)  Most recent weight: Weight: 91.3 kg    Patient Active Problem List   Diagnosis Date Noted   Metabolic acidosis 16/08/9603   Acute metabolic encephalopathy 54/07/8118   Aspiration pneumonia of both lower lobes ([place]) 04/14/2022   Acute urinary retention 04/14/2022   Acute respiratory failure with hypoxia ([facility]) 04/13/2022   Decreased pedal pulses 04/13/2022   Acute renal failure superimposed on stage 4 chronic kidney disease ([place]) 04/13/2022   Osteomyelitis ([place]) 04/12/2022   Leg swelling 12/02/2020   Heart block 05/27/2020   Fall 05/27/2020   Heart block AV complete ([place]) 05/27/2020   Hypothyroidism 11/07/2018   CKD (chronic kidney disease), stage IV ([place]) 11/07/2018   Abnormal liver function 11/07/2018   NHL (non-Hodgkin's lymphoma) ([place]) 11/07/2018   Venous stasis dermatitis 05/28/2015   Insomnia 05/28/2015   Cancer ([place]) 09/05/2013   Lymphadenopathy, cervical 08/29/2013   Acid reflux    Hypertension    Peripheral vascular disease [facility])     Palliative Care Assessment & Plan   Recommendations/Plan: Recommend outpatient palliative  Code Status:    Code Status Orders  (From admission, onward)           Start     Ordered   04/12/22 1838  Do not attempt resuscitation (DNR)  Continuous       Question Answer Comment  In the event of cardiac or respiratory ARREST Do not call a "code blue"   In the event of cardiac or respiratory ARREST Do not perform Intubation, CPR, defibrillation or ACLS   In the event of cardiac or respiratory ARREST Use medication by any route, position, wound care, and other measures to relive pain and suffering. May use oxygen, suction and  manual treatment of airway obstruction as needed for comfort.      04/12/22 1838           Code Status History     Date Active Date Inactive Code Status Order ID Comments User Context   05/27/2020 2222 05/29/2020 1831 DNR [id number]  Dion Body, MD Inpatient   11/07/2018 0237 11/13/2018 1448 DNR [id number]  Ivor Costa, MD ED   01/18/2017 0248 01/20/2017 1745 DNR [id number]  Karmen Bongo, MD Inpatient       Prognosis:  < 6 months   Thank you for allowing the Palliative Medicine Team to assist in the care of this patient.   Asencion Gowda, NP  Please contact Palliative Medicine Team phone at [phone] for questions and concerns.

## 2022-04-21 NOTE — Progress Notes (Signed)
Report called to [place] at [employer].

## 2022-04-21 NOTE — Care Management Important Message (Signed)
Important Message  Patient Details  Name: [name] MRN: [id number] Date of Birth: 03/03/1919   Medicare Important Message Given:  Yes     Dannette Barbara 04/21/2022, 11:37 AM

## 2022-04-21 NOTE — TOC Transition Note (Signed)
Transition of Care [facility]) - CM/SW Discharge Note   Patient Details  Name: [name] MRN: [id number] Date of Birth: 09/13/1919  [facility]SW Contact:  Alberteen Sam, LCSW Phone Number: 04/21/2022, 10:34 AM   Clinical Narrative:     Patient will DC to: [facility] Anticipated DC date: 04/21/22 Family notified:daughter Becky Transport by: Johnanna Schneiders  Per MD patient ready for DC to [facility]. RN, patient, patient's family, and facility notified of DC. Discharge Summary sent to facility. RN given number for report  [phone]. DC packet on chart. Ambulance transport requested for patient.  CSW signing off.  Pricilla Riffle, LCSW   Final next level of care: Skilled Nursing Facility Barriers to Discharge: No Barriers Identified   Patient Goals and CMS Choice Patient states their goals for this hospitalization and ongoing recovery are:: to go home CMS Medicare.gov Compare Post Acute Care list provided to:: Patient Represenative (must comment)    Discharge Placement              Patient chooses bed at: [facility] Patient to be transferred to facility by: [facility]   Patient and family notified of of transfer: 04/21/22  Discharge Plan and Services                                     Social Determinants of Health (SDOH) Interventions     Readmission Risk Interventions     View : No data to display.

## 2022-04-21 NOTE — Plan of Care (Signed)

## 2022-04-21 NOTE — Discharge Summary (Signed)
Physician Discharge Summary   DAXX TIGGS  male DOB: 1919-07-30  WIO:[id number]  PCP: Susy Frizzle, MD  Admit date: 04/12/2022 Discharge date: 04/21/2022  Admitted From: home Disposition:  SNF CODE STATUS: DNR  Discharge Instructions     Diet general   Complete by: As directed    Discharge wound care:   Complete by: As directed    Wound care  Daily      Apply a piece of Aquacel Kellie Simmering # [phone]) to right foot wound Q day, then cover with ABD pad and kerlex.  Moisten with saline each time to assist with removal. [facility] Course:  For full details, please see H&P, progress notes, consult notes and ancillary notes.  Briefly,  [name] is a 88 year old male with hypothyroidism, congestive heart failure, CKD IV, hypertension, persistent A-fib with pacer due to complete heart block, on Xarelto, wound on the right foot who lives at home by himself but family nearby brought to the ED by the daughter due to altered mental status.   Patient had delirium secondary to sleep deprivation.  He also developed aspiration pneumonia with acute hypoxemic respiratory failure.  He was treated with the Seroquel and melatonin, mental status much improved. He has since been off oxygen, was able to work with the physical therapist.    Acute metabolic encephalopathy. Sleep deprivation. Anorexia Patient mental status has back to baseline.  His appetite is also improving after giving megestrol. --cont seroqeul 25 mg nightly as PRN --cont Megace   Acute hypoxemic respiratory failure. Bilateral aspiration pneumonia. Condition improved, off oxygen.  Completed 5 days of zosyn and doxy.  Foot ulcer with chronic osteomyelitis of right foot distal fifth metatarsal head and neck. This is not the source of metabolic encephalopathy. --Family in agreement we we will continue conservative care plan nonsurgical management. Discussed with Dr. Amalia Hailey, will treat the patient with additional 2  weeks of Augmentin and doxycycline (10 more days left after discharge). --cont dressing change per order. --Resume outpatient wound care at the [facility] wound care center where the patient is already established   Acute Urinary retention due to benign prostate hypertrophy --Pt failed 1 voiding trial.  Foley catheter was re-anchored 5/21, will need to keep it for at least 2 weeks for now before attempting another voiding trial. --started on Flomax    Acute kidney injury superimposed on chronic kidney disease stage IV. Metabolic acidosis. --Cr 2.24 on presentation, back to baseline 1.58 by 04/18/22.   --received Na bicarb during hospitalization with improvement in metabolic acidosis.   --Home lasix held pending outpatient f/u due to AKI and CKD.   Essential hypertension, not currently active --BP wnl without BP meds --Home lasix held pending outpatient f/u due to AKI and CKD.    Discharge Diagnoses:  Principal Problem:   Osteomyelitis ([place]) Active Problems:   Hypertension   Hypothyroidism   Acid reflux   CKD (chronic kidney disease), stage IV (HCC)   Acute respiratory failure with hypoxia (HCC)   Decreased pedal pulses   Acute renal failure superimposed on stage 4 chronic kidney disease (HCC)   Acute metabolic encephalopathy   Aspiration pneumonia of both lower lobes (HCC)   Acute urinary retention   Metabolic acidosis   30 Day Unplanned Readmission Risk Score    Flowsheet Row ED to Hosp-Admission (Current) from 04/12/2022 in [facility]  30 Day Unplanned Readmission Risk Score (%) 15.9 Filed at 04/21/2022  0801       This score is the patient's risk of an unplanned readmission within 30 days of being discharged (0 -100%). The score is based on dignosis, age, lab data, medications, orders, and past utilization.   Low:  0-14.9   Medium: 15-21.9   High: 22-29.9   Extreme: 30 and above         Discharge Instructions:  Allergies as of 04/21/2022        Reactions   Codeine Nausea And Vomiting   Percocet [oxycodone-acetaminophen] Nausea And Vomiting        Medication List     STOP taking these medications    NONFORMULARY OR COMPOUNDED ITEM   sulfamethoxazole-trimethoprim 400-80 MG tablet Commonly known as: BACTRIM       TAKE these medications    amoxicillin-clavulanate 500-125 MG tablet Commonly known as: AUGMENTIN Take 1 tablet (500 mg total) by mouth 2 (two) times daily for 10 days.   aspirin EC 81 MG tablet Take 81 mg by mouth daily. Swallow whole.   betamethasone dipropionate 0.05 % cream Apply topically 2 (two) times daily. Apply small amount to red areas on legs twice daily.   diphenhydramine-acetaminophen 25-500 MG Tabs tablet Commonly known as: TYLENOL PM Take 1 tablet by mouth at bedtime as needed.   doxycycline 100 MG tablet Commonly known as: VIBRA-TABS Take 1 tablet (100 mg total) by mouth every 12 (twelve) hours for 10 days.   feeding supplement Liqd Take 237 mLs by mouth 3 (three) times daily between meals.   furosemide 20 MG tablet Commonly known as: LASIX Hold until outpatient followup due to acute kidney injury. What changed: additional instructions   levothyroxine 112 MCG tablet Commonly known as: SYNTHROID TAKE 1 TABLET BY MOUTH  DAILY   megestrol 400 MG/10ML suspension Commonly known as: MEGACE Take 10 mLs (400 mg total) by mouth daily. Start taking on: Apr 22, 2022   melatonin 5 MG Tabs Take 1 tablet (5 mg total) by mouth at bedtime.   mometasone 0.1 % cream Commonly known as: ELOCON USE AS DIRECTED   pregabalin 25 MG capsule Commonly known as: LYRICA TAKE 1 CAPSULE BY MOUTH IN  THE MORNING AND 2 CAPSULES  IN THE EVENING   QUEtiapine 25 MG tablet Commonly known as: SEROQUEL Take 1 tablet (25 mg total) by mouth at bedtime as needed (to help with sleep).   silver sulfADIAZINE 1 % cream Commonly known as: Silvadene Apply 1 application topically daily.   tamsulosin 0.4 MG  Caps capsule Commonly known as: FLOMAX Take 1 capsule (0.4 mg total) by mouth daily after supper.   triamcinolone cream 0.1 % Commonly known as: KENALOG Apply 1 application topically 3 (three) times daily.   vitamin B-12 1000 MCG tablet Commonly known as: CYANOCOBALAMIN Take 1 tablet (1,000 mcg total) by mouth daily.               Discharge Care Instructions  (From admission, onward)           Start     Ordered   04/21/22 0000  Discharge wound care:       Comments: Wound care  Daily      Apply a piece of Aquacel Kellie Simmering # [phone]) to right foot wound Q day, then cover with ABD pad and kerlex.  Moisten with saline each time to assist with removal. - -   04/21/22 9024             Follow-up Information  Susy Frizzle, MD Follow up.   Specialty: Family Medicine Contact information: [address] [place] [address] [phone]                 Allergies  Allergen Reactions   Codeine Nausea And Vomiting   Percocet [Oxycodone-Acetaminophen] Nausea And Vomiting     The results of significant diagnostics from this hospitalization (including imaging, microbiology, ancillary and laboratory) are listed below for reference.   Consultations:   Procedures/Studies: CT ABDOMEN PELVIS WO CONTRAST  Result Date: 04/12/2022 CLINICAL DATA:  Nausea, lower abdominal pain, dark urine, question ureteral calculus; also with increased weakness, not sleeping, altered mental status, hallucinations EXAM: CT ABDOMEN AND PELVIS WITHOUT CONTRAST TECHNIQUE: Multidetector CT imaging of the abdomen and pelvis was performed following the standard protocol without IV contrast. RADIATION DOSE REDUCTION: This exam was performed according to the departmental dose-optimization program which includes automated exposure control, adjustment of the mA and/or kV according to patient size and/or use of iterative reconstruction technique. COMPARISON:  11/06/2018 FINDINGS: Lower  chest: Bibasilar pulmonary infiltrates. Pacemaker leads RIGHT atrium and RIGHT ventricle. Hepatobiliary: Gallbladder surgically absent.  Liver unremarkable. Pancreas: Atrophic pancreas without mass Spleen: Normal appearing spleen.  Two adjacent splenules. Adrenals/Urinary Tract: Adrenal glands normal appearance. Renal cortical thinning bilaterally. Kidneys, ureters, and bladder otherwise normal appearance. No evidence of renal mass or urinary tract calcification. No hydronephrosis or hydroureter. Bladder unremarkable. Stomach/Bowel: Diffuse colonic diverticulosis without evidence of diverticulitis. Normal appendix. Large hiatal hernia. Remaining bowel loops unremarkable. Vascular/Lymphatic: Atherosclerotic calcifications aorta, iliac arteries, visceral arteries, coronary arteries. Aorta normal caliber. No adenopathy. Reproductive: Prostatic enlargement. Other: No free air or free fluid. No hernia or inflammatory process. Musculoskeletal: Diffuse osseous demineralization. IMPRESSION: Colonic diverticulosis without evidence of diverticulitis. Large hiatal hernia. Prostatic enlargement. No evidence of urinary tract calcification or obstruction. Bibasilar pulmonary infiltrates. Aortic Atherosclerosis (ICD10-I70.0). Electronically Signed   By: Lavonia Dana M.D.   On: 04/12/2022 17:51   CT HEAD WO CONTRAST (5MM)  Result Date: 04/12/2022 CLINICAL DATA:  Altered mental status, weakness, not sleeping EXAM: CT HEAD WITHOUT CONTRAST TECHNIQUE: Contiguous axial images were obtained from the base of the skull through the vertex without intravenous contrast. RADIATION DOSE REDUCTION: This exam was performed according to the departmental dose-optimization program which includes automated exposure control, adjustment of the mA and/or kV according to patient size and/or use of iterative reconstruction technique. COMPARISON:  CT head 05/27/2020 FINDINGS: Brain: There is no acute intracranial hemorrhage, extra-axial fluid  collection, or acute infarct. Parenchymal volume is within normal limits for age. The ventricles are stable in size. Patchy hypodensity in the subcortical and periventricular white matter likely reflects sequela of chronic white matter microangiopathy. There is no mass lesion.  There is no mass effect or midline shift. Vascular: There is calcification of the bilateral cavernous ICAs and vertebral arteries. Skull: Normal. Negative for fracture or focal lesion. Sinuses/Orbits: Paranasal sinuses are clear. A right lens implant is noted. The globes and orbits are otherwise unremarkable. Other: None. IMPRESSION: No acute intracranial pathology. No significant interval change since 2021. Electronically Signed   By: Valetta Mole M.D.   On: 04/12/2022 13:27   US ARTERIAL ABI (SCREENING LOWER EXTREMITY)  Result Date: 04/13/2022 CLINICAL DATA:  Right foot osteomyelitis EXAM: NONINVASIVE PHYSIOLOGIC VASCULAR STUDY OF BILATERAL LOWER EXTREMITIES (ATTEMPTED) TECHNIQUE: Evaluation of both lower extremities was attempted performed at rest, including calculation of ankle-brachial indices with single level Doppler, pressure and pulse volume recording. COMPARISON:  None Available. FINDINGS: Systolic blood  pressure right brachial 147 mmHg, left brachial 150 mmHg. Technologist describes technically difficult study secondary to patient unable to lie still, thrashing during exam, unable to cooperate. Pedal pulses could not be obtained, therefore ABIs cannot be calculated. IMPRESSION: Limited study. Electronically Signed   By: Lucrezia Europe M.D.   On: 04/13/2022 12:02   DG Chest Port 1 View  Result Date: 04/14/2022 CLINICAL DATA:  Dyspnea EXAM: PORTABLE CHEST 1 VIEW COMPARISON:  Film from the previous day. FINDINGS: Cardiac shadow is enlarged but stable. Pacing device is stable. Lungs are hypoinflated with bilateral airspace opacities worst in the right upper lobe increased from the prior exam. No sizable effusion is noted. No bony  abnormality is seen. IMPRESSION: Increasing bilateral airspace opacities particularly in the right upper lobe. Electronically Signed   By: Inez Catalina M.D.   On: 04/14/2022 01:19   DG Chest Port 1 View  Result Date: 04/13/2022 CLINICAL DATA:  Short of breath, altered level of consciousness EXAM: PORTABLE CHEST 1 VIEW COMPARISON:  04/12/2022 FINDINGS: Single frontal view of the chest demonstrates stable dual lead pacer. Cardiac silhouette is mildly enlarged. Lung volumes are diminished, with progressive central vascular congestion and increasing interstitial and ground-glass opacities consistent with pulmonary edema. Small left effusion. No pneumothorax. IMPRESSION: 1. Worsening volume status with increased pulmonary edema. Electronically Signed   By: Randa Ngo M.D.   On: 04/13/2022 16:59   DG Chest Portable 1 View  Result Date: 04/12/2022 CLINICAL DATA:  Altered mental status. EXAM: PORTABLE CHEST 1 VIEW COMPARISON:  05/29/2020 FINDINGS: Cardiomegaly and pulmonary vascular congestion noted. Mild interstitial opacities are noted suspicious for mild edema. A LEFT-sided pacemaker is again identified. No pneumothorax or large pleural effusions noted. No acute bony abnormalities are identified. IMPRESSION: Cardiomegaly with pulmonary vascular congestion and probable mild interstitial edema. Electronically Signed   By: Margarette Canada M.D.   On: 04/12/2022 17:02   DG Foot Complete Right  Result Date: 04/12/2022 CLINICAL DATA:  RIGHT foot wound and pain. EXAM: RIGHT FOOT COMPLETE - 3+ VIEW COMPARISON:  03/15/2022 radiographs FINDINGS: LATERAL skin defect/wound again identified. There is unchanged cortical resorption along the LATERAL 5th metatarsal head/neck compatible with osteomyelitis. No other definite acute bony abnormalities or osteomyelitis noted. Soft tissue swelling is present. Vascular calcifications are identified. IMPRESSION: Distal 5th metatarsal head/neck osteomyelitis again noted with overlying  wound and soft tissue swelling. Electronically Signed   By: Margarette Canada M.D.   On: 04/12/2022 17:01      Labs: BNP (last 3 results) Recent Labs    04/12/22 1334  BNP 601.0*   Basic Metabolic Panel: Recent Labs  Lab 04/15/22 0550 04/16/22 0536 04/17/22 0542 04/18/22 0204  NA 138 137 142 142  K 3.9 4.1 4.3 4.2  CL 105 107 110 110  CO2 21* 20* 23 24  GLUCOSE 103* 99 103* 111*  BUN 34* 37* 37* 36*  CREATININE 2.00* 1.89* 1.70* 1.58*  CALCIUM 8.7* 8.6* 8.7* 8.9  MG 2.0  --   --  2.0   Liver Function Tests: No results for input(s): AST, ALT, ALKPHOS, BILITOT, PROT, ALBUMIN in the last 168 hours. No results for input(s): LIPASE, AMYLASE in the last 168 hours. No results for input(s): AMMONIA in the last 168 hours. CBC: Recent Labs  Lab 04/18/22 0204  WBC 5.1  HGB 10.9*  HCT 34.2*  MCV 91.9  PLT 142*   Cardiac Enzymes: No results for input(s): CKTOTAL, CKMB, CKMBINDEX, TROPONINI in the last 168 hours. BNP: Invalid input(s):  POCBNP CBG: No results for input(s): GLUCAP in the last 168 hours. D-Dimer No results for input(s): DDIMER in the last 72 hours. Hgb A1c No results for input(s): HGBA1C in the last 72 hours. Lipid Profile No results for input(s): CHOL, HDL, LDLCALC, TRIG, CHOLHDL, LDLDIRECT in the last 72 hours. Thyroid function studies No results for input(s): TSH, T4TOTAL, T3FREE, THYROIDAB in the last 72 hours.  Invalid input(s): FREET3 Anemia work up No results for input(s): VITAMINB12, FOLATE, FERRITIN, TIBC, IRON, RETICCTPCT in the last 72 hours. Urinalysis    Component Value Date/Time   COLORURINE YELLOW (A) 04/14/2022 0800   APPEARANCEUR HAZY (A) 04/14/2022 0800   LABSPEC 1.008 04/14/2022 0800   PHURINE 5.0 04/14/2022 0800   GLUCOSEU NEGATIVE 04/14/2022 0800   HGBUR LARGE (A) 04/14/2022 0800   BILIRUBINUR NEGATIVE 04/14/2022 0800   KETONESUR NEGATIVE 04/14/2022 0800   PROTEINUR NEGATIVE 04/14/2022 0800   UROBILINOGEN 1.0 02/13/2012 1401    NITRITE NEGATIVE 04/14/2022 0800   LEUKOCYTESUR NEGATIVE 04/14/2022 0800   Sepsis Labs Invalid input(s): PROCALCITONIN,  WBC,  LACTICIDVEN Microbiology Recent Results (from the past 240 hour(s))  Urine Culture     Status: Abnormal   Collection Time: 04/12/22  4:46 PM   Specimen: Urine, Clean Catch  Result Value Ref Range Status   Specimen Description   Final    URINE, CLEAN CATCH Performed at [facility], [address], [place], [place] [address]    Special Requests   Final    NONE Performed at [facility], [address] [place], [place] [address]    Culture (A)  Final    <10,000 COLONIES/mL INSIGNIFICANT GROWTH Performed at [facility], [place] [address], [place], [place] [address]    Report Status 04/14/2022 FINAL  Final     Total time spend on discharging this patient, including the last patient exam, discussing the hospital stay, instructions for ongoing care as it relates to all pertinent caregivers, as well as preparing the medical discharge records, prescriptions, and/or referrals as applicable, is 30 minutes.    Enzo Bi, MD  [facility] 04/21/2022, 9:39 AM

## 2022-04-26 ENCOUNTER — Ambulatory Visit: Payer: Medicare Other | Admitting: Physician Assistant

## 2022-04-26 DIAGNOSIS — J9601 Acute respiratory failure with hypoxia: Secondary | ICD-10-CM | POA: Diagnosis not present

## 2022-04-26 DIAGNOSIS — G894 Chronic pain syndrome: Secondary | ICD-10-CM | POA: Diagnosis not present

## 2022-04-26 DIAGNOSIS — I87393 Chronic venous hypertension (idiopathic) with other complications of bilateral lower extremity: Secondary | ICD-10-CM | POA: Diagnosis not present

## 2022-04-26 DIAGNOSIS — R338 Other retention of urine: Secondary | ICD-10-CM | POA: Diagnosis not present

## 2022-04-26 DIAGNOSIS — G629 Polyneuropathy, unspecified: Secondary | ICD-10-CM | POA: Diagnosis not present

## 2022-04-26 DIAGNOSIS — G479 Sleep disorder, unspecified: Secondary | ICD-10-CM | POA: Diagnosis not present

## 2022-04-26 DIAGNOSIS — K219 Gastro-esophageal reflux disease without esophagitis: Secondary | ICD-10-CM | POA: Diagnosis not present

## 2022-04-26 DIAGNOSIS — I10 Essential (primary) hypertension: Secondary | ICD-10-CM | POA: Diagnosis not present

## 2022-04-26 DIAGNOSIS — M869 Osteomyelitis, unspecified: Secondary | ICD-10-CM | POA: Diagnosis not present

## 2022-04-26 DIAGNOSIS — N184 Chronic kidney disease, stage 4 (severe): Secondary | ICD-10-CM | POA: Diagnosis not present

## 2022-04-26 DIAGNOSIS — E039 Hypothyroidism, unspecified: Secondary | ICD-10-CM | POA: Diagnosis not present

## 2022-04-28 ENCOUNTER — Telehealth: Payer: Self-pay

## 2022-04-28 DIAGNOSIS — L89890 Pressure ulcer of other site, unstageable: Secondary | ICD-10-CM | POA: Diagnosis not present

## 2022-04-28 DIAGNOSIS — M869 Osteomyelitis, unspecified: Secondary | ICD-10-CM | POA: Diagnosis not present

## 2022-04-28 DIAGNOSIS — I10 Essential (primary) hypertension: Secondary | ICD-10-CM | POA: Diagnosis not present

## 2022-04-28 DIAGNOSIS — G894 Chronic pain syndrome: Secondary | ICD-10-CM | POA: Diagnosis not present

## 2022-04-28 DIAGNOSIS — E039 Hypothyroidism, unspecified: Secondary | ICD-10-CM | POA: Diagnosis not present

## 2022-04-28 NOTE — Telephone Encounter (Signed)
Return call to [facility] we had received a vm they were need a  dx for his PT. Pt is no longer in home PT he was admitted to the hospital and will not need service at this time.

## 2022-05-02 ENCOUNTER — Ambulatory Visit: Payer: Medicare Other | Admitting: Physician Assistant

## 2022-05-02 DIAGNOSIS — N390 Urinary tract infection, site not specified: Secondary | ICD-10-CM | POA: Diagnosis not present

## 2022-05-02 DIAGNOSIS — R338 Other retention of urine: Secondary | ICD-10-CM | POA: Diagnosis not present

## 2022-05-02 DIAGNOSIS — G894 Chronic pain syndrome: Secondary | ICD-10-CM | POA: Diagnosis not present

## 2022-05-02 DIAGNOSIS — I10 Essential (primary) hypertension: Secondary | ICD-10-CM | POA: Diagnosis not present

## 2022-05-02 DIAGNOSIS — M869 Osteomyelitis, unspecified: Secondary | ICD-10-CM | POA: Diagnosis not present

## 2022-05-05 DIAGNOSIS — L89890 Pressure ulcer of other site, unstageable: Secondary | ICD-10-CM | POA: Diagnosis not present

## 2022-05-05 DIAGNOSIS — I10 Essential (primary) hypertension: Secondary | ICD-10-CM | POA: Diagnosis not present

## 2022-05-05 DIAGNOSIS — R338 Other retention of urine: Secondary | ICD-10-CM | POA: Diagnosis not present

## 2022-05-05 DIAGNOSIS — N390 Urinary tract infection, site not specified: Secondary | ICD-10-CM | POA: Diagnosis not present

## 2022-05-05 DIAGNOSIS — G894 Chronic pain syndrome: Secondary | ICD-10-CM | POA: Diagnosis not present

## 2022-05-05 DIAGNOSIS — M869 Osteomyelitis, unspecified: Secondary | ICD-10-CM | POA: Diagnosis not present

## 2022-05-05 DIAGNOSIS — K219 Gastro-esophageal reflux disease without esophagitis: Secondary | ICD-10-CM | POA: Diagnosis not present

## 2022-05-06 DIAGNOSIS — I10 Essential (primary) hypertension: Secondary | ICD-10-CM | POA: Diagnosis not present

## 2022-05-09 ENCOUNTER — Ambulatory Visit: Payer: Medicare Other | Admitting: Physician Assistant

## 2022-05-09 DIAGNOSIS — N390 Urinary tract infection, site not specified: Secondary | ICD-10-CM | POA: Diagnosis not present

## 2022-05-09 DIAGNOSIS — G894 Chronic pain syndrome: Secondary | ICD-10-CM | POA: Diagnosis not present

## 2022-05-09 DIAGNOSIS — N184 Chronic kidney disease, stage 4 (severe): Secondary | ICD-10-CM | POA: Diagnosis not present

## 2022-05-09 DIAGNOSIS — I10 Essential (primary) hypertension: Secondary | ICD-10-CM | POA: Diagnosis not present

## 2022-05-10 ENCOUNTER — Telehealth: Payer: Self-pay

## 2022-05-10 NOTE — Telephone Encounter (Signed)
Pt's daughter called in stating that pt will be discharged from hospital in the next few days and would like to resume [place], PT, and OT services with [facility]. Pt's daughter stated that as pt was starting to get services, pt was hospitalized and was not able to start any rehab or home health. Pt's daughter would like to get a cb about resuming services please.   Please call Cindra Eves (daughter) [phone]

## 2022-05-10 NOTE — Telephone Encounter (Signed)
Spoke w/ pt daughter an explain to that  since her father is in the nurse home , when can send an other until he is discharged. Normal he a signed a case worker and some will arrange HHN for him. However , this doesn't happen please let know and we will sent a order to the Home Health  they will come out and do an assessment inform us of what services her father needs.

## 2022-05-12 DIAGNOSIS — L89890 Pressure ulcer of other site, unstageable: Secondary | ICD-10-CM | POA: Diagnosis not present

## 2022-05-13 ENCOUNTER — Other Ambulatory Visit: Payer: Self-pay

## 2022-05-13 ENCOUNTER — Telehealth: Payer: Self-pay

## 2022-05-13 DIAGNOSIS — M869 Osteomyelitis, unspecified: Secondary | ICD-10-CM | POA: Diagnosis not present

## 2022-05-13 DIAGNOSIS — I459 Conduction disorder, unspecified: Secondary | ICD-10-CM

## 2022-05-13 DIAGNOSIS — R338 Other retention of urine: Secondary | ICD-10-CM

## 2022-05-13 DIAGNOSIS — R531 Weakness: Secondary | ICD-10-CM | POA: Diagnosis not present

## 2022-05-13 DIAGNOSIS — R29898 Other symptoms and signs involving the musculoskeletal system: Secondary | ICD-10-CM

## 2022-05-13 DIAGNOSIS — I442 Atrioventricular block, complete: Secondary | ICD-10-CM

## 2022-05-13 DIAGNOSIS — N1832 Chronic kidney disease, stage 3b: Secondary | ICD-10-CM

## 2022-05-13 DIAGNOSIS — E46 Unspecified protein-calorie malnutrition: Secondary | ICD-10-CM | POA: Diagnosis not present

## 2022-05-13 DIAGNOSIS — N184 Chronic kidney disease, stage 4 (severe): Secondary | ICD-10-CM | POA: Diagnosis not present

## 2022-05-13 DIAGNOSIS — J9601 Acute respiratory failure with hypoxia: Secondary | ICD-10-CM

## 2022-05-13 NOTE — Telephone Encounter (Addendum)
Called spoke with [facility] regarding referral  . Spoke with office explain them to that pt is suppose to d/c from the nursing home today and pt's daughter would like for him start care with them. She  said that along the patient is in the nurse home that they wouldn't start care, and normally the careworker is the one set up this up for the. I explain to the resp. That I was aware of this. However the daughter is the want our office to do this,put in referral . She said that she will be on the look out for the referral.[phone] phone to [facility]

## 2022-05-13 NOTE — Telephone Encounter (Signed)
Called and spoke w/ pt daughter she said that her father would be d/c from the nursing home today need to have [place] arrange for  him at [facility] . She was told by [facility] that she will need a 48 hours notice when he is being d/c to arrange a home assessment.I asked her if he had case worker  because the normally will  sit up any [place] for the patient before he is d/c. She said that she was told that our would have to do it. I asked her for the information for the nursing home and person and her father case worker.  She told me that she had talking with Cori Razor  the Admit and Gertie Gowda  . I told her I would call and speck with them regarding her for care he leaves. Called [phone] asked to speck with speck Roselyn Reef or Nicole Kindred and was told they were in meeting . Gave my info ask for a call back today.

## 2022-05-15 ENCOUNTER — Other Ambulatory Visit: Payer: Self-pay | Admitting: Family Medicine

## 2022-05-16 ENCOUNTER — Telehealth: Payer: Self-pay

## 2022-05-16 NOTE — Telephone Encounter (Signed)
Transition Care Management Follow-up Telephone Call Date of discharge and from where: D/C 05/13/2022 from [facility]. How have you been since you were released from the hospital? Daughter, [name], states that pt is doing much better.  Any questions or concerns? No  Items Reviewed: Did the pt receive and understand the discharge instructions provided? Yes  Medications obtained and verified? Yes  Other? No  Any new allergies since your discharge? No  Dietary orders reviewed? Yes Do you have support at home? Yes   Home Care and Equipment/Supplies: Were home health services ordered? yes If so, what is the name of the agency? [facility]  Has the agency set up a time to come to the patient's home? no Were any new equipment or medical supplies ordered?  No What is the name of the medical supply agency? N/A Were you able to get the supplies/equipment? not applicable Do you have any questions related to the use of the equipment or supplies? No  Functional Questionnaire: (I = Independent and D = Dependent) ADLs: D  Bathing/Dressing- D  Meal Prep- D  Eating- D  Maintaining continence- D  Transferring/Ambulation- D  Managing Meds- D  Follow up appointments reviewed:  PCP Hospital f/u appt confirmed? Yes  Scheduled to see Dr. Dennard Rodriguez on 05/26/22 @ 12:00. [facility] f/u appt confirmed? No  N/A Are transportation arrangements needed? No  If their condition worsens, is the pt aware to call PCP or go to the Emergency Dept.? Yes Was the patient provided with contact information for the PCP's office or ED? Yes Was to pt encouraged to call back with questions or concerns? Yes

## 2022-05-16 NOTE — Telephone Encounter (Signed)
Transition Care Management Unsuccessful Follow-up Telephone Call  Date of discharge and from where:  05/13/2022 [facility]  Attempts:  1st Attempt  Reason for unsuccessful TCM follow-up call:  Left voice message

## 2022-05-16 NOTE — Telephone Encounter (Signed)
Requested Prescriptions  Pending Prescriptions Disp Refills  . levothyroxine (SYNTHROID) 112 MCG tablet [Pharmacy Med Name: Levothyroxine Sodium 112 MCG Oral Tablet] 90 tablet 0    Sig: TAKE 1 TABLET BY MOUTH  DAILY     Endocrinology:  Hypothyroid Agents Passed - 05/16/2022  2:22 PM      Passed - TSH in normal range and within 360 days    TSH  Date Value Ref Range Status  06/25/2021 0.42 0.40 - 4.50 mIU/L Final         Passed - Valid encounter within last 12 months    Recent Outpatient Visits          2 months ago Ischemic ulcer, limited to breakdown of skin ([place])   [place] Pick[facility], Cammie Mcgee, MD   3 months ago Wound of right lower extremity, subsequent encounter   [place] Susy Frizzle, MD   4 months ago Wound of right lower extremity, subsequent encounter   [place] Susy Frizzle, MD   4 months ago Stage 3b chronic kidney disease ([place])   [place] Susy Frizzle, MD   4 months ago Wound of right lower extremity, initial encounter   [place] Pick[facility], Cammie Mcgee, MD

## 2022-05-18 ENCOUNTER — Telehealth: Payer: Self-pay

## 2022-05-18 NOTE — Telephone Encounter (Signed)
Aileen from [place] called and stated that they reached out to [facility] and they are not able to take this pt for Home health services due to staff and she also contact [employer](state programs of [place]) and his insurance dosent cover them /please advise   Aileen/ Customer care center of [place] [facility]# [phone] ext [phone]  She called the wrong office

## 2022-05-18 NOTE — Telephone Encounter (Signed)
Per referral: [3:33 PM] Elenora Fender, [place]  I have contacted [facility] and they have given me a new fax number to try [phone]).   [3:34 PM] Elenora Fender, [place]  Requested information has been refaxed to [phone]. Release ID # [id number]  However, pt's daughter, [name], called stating that [facility] still have not gotten any referral from them. I advice daughter that I will call Donzetta Sprung and find out what is going on. Ill call daughter back as soon as I find something out.

## 2022-05-18 NOTE — Telephone Encounter (Signed)
Pt's daughter called today and still have not heard anything from [facility].   Advice daughter, we'll check w/referral dept to see what the hold up is and will get back to as soon as I hear something from referrals dept.

## 2022-05-19 ENCOUNTER — Telehealth: Payer: Self-pay

## 2022-05-19 ENCOUNTER — Other Ambulatory Visit: Payer: Self-pay

## 2022-05-19 DIAGNOSIS — W19XXXA Unspecified fall, initial encounter: Secondary | ICD-10-CM

## 2022-05-19 DIAGNOSIS — I739 Peripheral vascular disease, unspecified: Secondary | ICD-10-CM

## 2022-05-19 DIAGNOSIS — R0989 Other specified symptoms and signs involving the circulatory and respiratory systems: Secondary | ICD-10-CM

## 2022-05-19 DIAGNOSIS — M7989 Other specified soft tissue disorders: Secondary | ICD-10-CM

## 2022-05-19 NOTE — Telephone Encounter (Signed)
Call spoke with daughter. Daughter is requesting for a referral for PT w/[facility]. Advice daughter to call pt's insurance company to see who pt's insurance is contracted w/for home health. Daughter voiced understanding and let me know once she finds out.   Referral put in for PT

## 2022-05-19 NOTE — Telephone Encounter (Signed)
Pt daughter called in requesting a referral for home health services for pt. Pt's daughter would like a company that accepts [employer]. Please advise.  Cb#: [phone]

## 2022-05-20 ENCOUNTER — Other Ambulatory Visit: Payer: Self-pay

## 2022-05-20 DIAGNOSIS — M7989 Other specified soft tissue disorders: Secondary | ICD-10-CM

## 2022-05-20 DIAGNOSIS — I739 Peripheral vascular disease, unspecified: Secondary | ICD-10-CM

## 2022-05-20 DIAGNOSIS — R29898 Other symptoms and signs involving the musculoskeletal system: Secondary | ICD-10-CM

## 2022-05-20 DIAGNOSIS — R0989 Other specified symptoms and signs involving the circulatory and respiratory systems: Secondary | ICD-10-CM

## 2022-05-20 DIAGNOSIS — J9601 Acute respiratory failure with hypoxia: Secondary | ICD-10-CM

## 2022-05-24 DIAGNOSIS — M86671 Other chronic osteomyelitis, right ankle and foot: Secondary | ICD-10-CM | POA: Diagnosis not present

## 2022-05-24 DIAGNOSIS — I739 Peripheral vascular disease, unspecified: Secondary | ICD-10-CM | POA: Diagnosis not present

## 2022-05-24 DIAGNOSIS — J690 Pneumonitis due to inhalation of food and vomit: Secondary | ICD-10-CM | POA: Diagnosis not present

## 2022-05-24 DIAGNOSIS — I4819 Other persistent atrial fibrillation: Secondary | ICD-10-CM | POA: Diagnosis not present

## 2022-05-24 DIAGNOSIS — L97512 Non-pressure chronic ulcer of other part of right foot with fat layer exposed: Secondary | ICD-10-CM | POA: Diagnosis not present

## 2022-05-24 DIAGNOSIS — Z8572 Personal history of non-Hodgkin lymphomas: Secondary | ICD-10-CM | POA: Diagnosis not present

## 2022-05-24 DIAGNOSIS — I872 Venous insufficiency (chronic) (peripheral): Secondary | ICD-10-CM | POA: Diagnosis not present

## 2022-05-24 DIAGNOSIS — Z950 Presence of cardiac pacemaker: Secondary | ICD-10-CM | POA: Diagnosis not present

## 2022-05-24 DIAGNOSIS — G9341 Metabolic encephalopathy: Secondary | ICD-10-CM | POA: Diagnosis not present

## 2022-05-24 DIAGNOSIS — I509 Heart failure, unspecified: Secondary | ICD-10-CM | POA: Diagnosis not present

## 2022-05-24 DIAGNOSIS — Z87891 Personal history of nicotine dependence: Secondary | ICD-10-CM | POA: Diagnosis not present

## 2022-05-24 DIAGNOSIS — E039 Hypothyroidism, unspecified: Secondary | ICD-10-CM | POA: Diagnosis not present

## 2022-05-24 DIAGNOSIS — N179 Acute kidney failure, unspecified: Secondary | ICD-10-CM | POA: Diagnosis not present

## 2022-05-24 DIAGNOSIS — I442 Atrioventricular block, complete: Secondary | ICD-10-CM | POA: Diagnosis not present

## 2022-05-24 DIAGNOSIS — N184 Chronic kidney disease, stage 4 (severe): Secondary | ICD-10-CM | POA: Diagnosis not present

## 2022-05-24 DIAGNOSIS — K219 Gastro-esophageal reflux disease without esophagitis: Secondary | ICD-10-CM | POA: Diagnosis not present

## 2022-05-24 DIAGNOSIS — Z89421 Acquired absence of other right toe(s): Secondary | ICD-10-CM | POA: Diagnosis not present

## 2022-05-24 DIAGNOSIS — Z466 Encounter for fitting and adjustment of urinary device: Secondary | ICD-10-CM | POA: Diagnosis not present

## 2022-05-24 DIAGNOSIS — R338 Other retention of urine: Secondary | ICD-10-CM | POA: Diagnosis not present

## 2022-05-24 DIAGNOSIS — J9601 Acute respiratory failure with hypoxia: Secondary | ICD-10-CM | POA: Diagnosis not present

## 2022-05-24 DIAGNOSIS — I130 Hypertensive heart and chronic kidney disease with heart failure and stage 1 through stage 4 chronic kidney disease, or unspecified chronic kidney disease: Secondary | ICD-10-CM | POA: Diagnosis not present

## 2022-05-25 DIAGNOSIS — N179 Acute kidney failure, unspecified: Secondary | ICD-10-CM | POA: Diagnosis not present

## 2022-05-25 DIAGNOSIS — I4819 Other persistent atrial fibrillation: Secondary | ICD-10-CM | POA: Diagnosis not present

## 2022-05-25 DIAGNOSIS — K219 Gastro-esophageal reflux disease without esophagitis: Secondary | ICD-10-CM | POA: Diagnosis not present

## 2022-05-25 DIAGNOSIS — I509 Heart failure, unspecified: Secondary | ICD-10-CM | POA: Diagnosis not present

## 2022-05-25 DIAGNOSIS — E039 Hypothyroidism, unspecified: Secondary | ICD-10-CM | POA: Diagnosis not present

## 2022-05-25 DIAGNOSIS — G9341 Metabolic encephalopathy: Secondary | ICD-10-CM | POA: Diagnosis not present

## 2022-05-25 DIAGNOSIS — J690 Pneumonitis due to inhalation of food and vomit: Secondary | ICD-10-CM | POA: Diagnosis not present

## 2022-05-25 DIAGNOSIS — Z87891 Personal history of nicotine dependence: Secondary | ICD-10-CM | POA: Diagnosis not present

## 2022-05-25 DIAGNOSIS — Z466 Encounter for fitting and adjustment of urinary device: Secondary | ICD-10-CM | POA: Diagnosis not present

## 2022-05-25 DIAGNOSIS — I130 Hypertensive heart and chronic kidney disease with heart failure and stage 1 through stage 4 chronic kidney disease, or unspecified chronic kidney disease: Secondary | ICD-10-CM | POA: Diagnosis not present

## 2022-05-25 DIAGNOSIS — R338 Other retention of urine: Secondary | ICD-10-CM | POA: Diagnosis not present

## 2022-05-25 DIAGNOSIS — J9601 Acute respiratory failure with hypoxia: Secondary | ICD-10-CM | POA: Diagnosis not present

## 2022-05-25 DIAGNOSIS — Z89421 Acquired absence of other right toe(s): Secondary | ICD-10-CM | POA: Diagnosis not present

## 2022-05-25 DIAGNOSIS — N184 Chronic kidney disease, stage 4 (severe): Secondary | ICD-10-CM | POA: Diagnosis not present

## 2022-05-25 DIAGNOSIS — I739 Peripheral vascular disease, unspecified: Secondary | ICD-10-CM | POA: Diagnosis not present

## 2022-05-25 DIAGNOSIS — L97512 Non-pressure chronic ulcer of other part of right foot with fat layer exposed: Secondary | ICD-10-CM | POA: Diagnosis not present

## 2022-05-25 DIAGNOSIS — I872 Venous insufficiency (chronic) (peripheral): Secondary | ICD-10-CM | POA: Diagnosis not present

## 2022-05-25 DIAGNOSIS — Z950 Presence of cardiac pacemaker: Secondary | ICD-10-CM | POA: Diagnosis not present

## 2022-05-25 DIAGNOSIS — Z8572 Personal history of non-Hodgkin lymphomas: Secondary | ICD-10-CM | POA: Diagnosis not present

## 2022-05-25 DIAGNOSIS — M86671 Other chronic osteomyelitis, right ankle and foot: Secondary | ICD-10-CM | POA: Diagnosis not present

## 2022-05-25 DIAGNOSIS — I442 Atrioventricular block, complete: Secondary | ICD-10-CM | POA: Diagnosis not present

## 2022-05-26 ENCOUNTER — Ambulatory Visit
Admission: RE | Admit: 2022-05-26 | Discharge: 2022-05-26 | Disposition: A | Discharge status: Home / Self Care | Payer: Medicare Other | Source: Ambulatory Visit | Attending: Family Medicine | Admitting: Family Medicine

## 2022-05-26 ENCOUNTER — Ambulatory Visit (INDEPENDENT_AMBULATORY_CARE_PROVIDER_SITE_OTHER): Payer: Medicare Other | Admitting: Family Medicine

## 2022-05-26 VITALS — BP 122/78 | HR 64 | Temp 97.5°F | Ht 70.0 in

## 2022-05-26 DIAGNOSIS — M86171 Other acute osteomyelitis, right ankle and foot: Secondary | ICD-10-CM

## 2022-05-26 DIAGNOSIS — R0989 Other specified symptoms and signs involving the circulatory and respiratory systems: Secondary | ICD-10-CM

## 2022-05-26 DIAGNOSIS — M419 Scoliosis, unspecified: Secondary | ICD-10-CM | POA: Diagnosis not present

## 2022-05-26 DIAGNOSIS — G9341 Metabolic encephalopathy: Secondary | ICD-10-CM | POA: Diagnosis not present

## 2022-05-26 DIAGNOSIS — K449 Diaphragmatic hernia without obstruction or gangrene: Secondary | ICD-10-CM | POA: Diagnosis not present

## 2022-05-26 DIAGNOSIS — I739 Peripheral vascular disease, unspecified: Secondary | ICD-10-CM | POA: Diagnosis not present

## 2022-05-26 DIAGNOSIS — R29898 Other symptoms and signs involving the musculoskeletal system: Secondary | ICD-10-CM

## 2022-05-26 DIAGNOSIS — N1832 Chronic kidney disease, stage 3b: Secondary | ICD-10-CM

## 2022-05-26 DIAGNOSIS — L97519 Non-pressure chronic ulcer of other part of right foot with unspecified severity: Secondary | ICD-10-CM | POA: Diagnosis not present

## 2022-05-26 DIAGNOSIS — M898X7 Other specified disorders of bone, ankle and foot: Secondary | ICD-10-CM | POA: Diagnosis not present

## 2022-05-26 DIAGNOSIS — R918 Other nonspecific abnormal finding of lung field: Secondary | ICD-10-CM | POA: Diagnosis not present

## 2022-05-26 MED ORDER — QUETIAPINE FUMARATE 25 MG PO TABS: 25.0000 mg | ORAL_TABLET | Freq: Every day | ORAL | 5 refills | Status: DC

## 2022-05-26 NOTE — Progress Notes (Signed)
Subjective:    Patient ID: [name], male    DOB: [birth year], 88 y.o.   MRN: [id number] Admit date: 04/12/2022 Discharge date: 04/21/2022   Admitted From: home Disposition:  SNF CODE STATUS: DNR   Discharge Instructions       Diet general   Complete by: As directed      Discharge wound care:   Complete by: As directed      Wound care  Daily      Apply a piece of Aquacel Kellie Simmering # [phone]) to right foot wound Q day, then cover with ABD pad and kerlex.  Moisten with saline each time to assist with removal. [facility] Course:  For full details, please see H&P, progress notes, consult notes and ancillary notes.  Briefly,  [name] is a 88 year old male with hypothyroidism, congestive heart failure, CKD IV, hypertension, persistent A-fib with pacer due to complete heart block, on Xarelto, wound on the right foot who lives at home by himself but family nearby brought to the ED by the daughter due to altered mental status.    Patient had delirium secondary to sleep deprivation.  He also developed aspiration pneumonia with acute hypoxemic respiratory failure.  He was treated with the Seroquel and melatonin, mental status much improved. He has since been off oxygen, was able to work with the physical therapist.    Acute metabolic encephalopathy. Sleep deprivation. Anorexia Patient mental status has back to baseline.  His appetite is also improving after giving megestrol. --cont seroqeul 25 mg nightly as PRN --cont Megace   Acute hypoxemic respiratory failure. Bilateral aspiration pneumonia. Condition improved, off oxygen.  Completed 5 days of zosyn and doxy.   Foot ulcer with chronic osteomyelitis of right foot distal fifth metatarsal head and neck. This is not the source of metabolic encephalopathy. --Family in agreement we we will continue conservative care plan nonsurgical management. Discussed with Dr. Amalia Hailey, will treat the patient with additional 2 weeks of  Augmentin and doxycycline (10 more days left after discharge). --cont dressing change per order. --Resume outpatient wound care at the [facility] wound care center where the patient is already established   Acute Urinary retention due to benign prostate hypertrophy --Pt failed 1 voiding trial.  Foley catheter was re-anchored 5/21, will need to keep it for at least 2 weeks for now before attempting another voiding trial. --started on Flomax    Acute kidney injury superimposed on chronic kidney disease stage IV. Metabolic acidosis. --Cr 2.24 on presentation, back to baseline 1.58 by 04/18/22.   --received Na bicarb during hospitalization with improvement in metabolic acidosis.   --Home lasix held pending outpatient f/u due to AKI and CKD.   Essential hypertension, not currently active --BP wnl without BP meds --Home lasix held pending outpatient f/u due to AKI and CKD.   05/26/22 Patient is here today with his daughter.  He still has a catheter in place.  He is seeing urology and is currently on sidalosin and finasteride.  He is no longer on tamsulosin.  However he has not had a voiding trial.  They are waiting to hear back from his urologist this week regarding when they are going to try the next voiding trial.  He denies any chest pain.  He denies any shortness of breath.  He has been off antibiotics for more than 2 weeks since being home from the nursing home and he denies any fevers.  His daughter denies any delirium.  He is no longer taking Lasix and has been off Lasix now for more than 3 weeks.  There is no swelling in his legs.  His lungs are clear except in the right base where I am hearing crackles.  This is asymmetric.  He denies any choking with food or coughing when he eats.  He denies any pleurisy or hemoptysis. Past Medical History:  Diagnosis Date   Acid reflux    Cancer ([place]) 09/05/13   left neck-non-hodgkins lymphoma   CKD (chronic kidney disease), stage IV ([place]) 11/07/2018    Hypertension    requires diuretic   Hypothyroidism    Malaria    while in the service   Peripheral vascular disease ([place])    slow wound healing on legs   Past Surgical History:  Procedure Laterality Date   AMPUTATION Left 06/25/2015   Procedure: LEFT  HALLUX AMPUTATION,;  Surgeon: Wylene Simmer, MD;  Location: [facility];  Service: Orthopedics;  Laterality: Left;  LOCAL/MAC   CHOLECYSTECTOMY N/A 11/08/2018   Procedure: LAPAROSCOPIC CHOLECYSTECTOMY WITH INTRAOPERATIVE CHOLANGIOGRAM;  Surgeon: [name], MD;  Location: [facility];  Service: General;  Laterality: N/A;   MASS BIOPSY Left 09/05/2013   Procedure: EXCISIONAL BIOPSY OF LEFT NECK MASS;  Surgeon: Jerrell Belfast, MD;  Location: [facility];  Service: ENT;  Laterality: Left;   PACEMAKER IMPLANT N/A 05/28/2020   Procedure: PACEMAKER IMPLANT;  Surgeon: Thompson Grayer, MD;  Location: [facility];  Service: Cardiovascular;  Laterality: N/A;   TONSILLECTOMY     YAG LASER APPLICATION Right 2/70/3500   Procedure: YAG LASER APPLICATION;  Surgeon: [name], MD;  Location: [facility];  Service: Ophthalmology;  Laterality: Right;   Current Outpatient Medications on File Prior to Visit  Medication Sig Dispense Refill   aspirin EC 81 MG tablet Take 81 mg by mouth daily. Swallow whole.     betamethasone dipropionate 0.05 % cream Apply topically 2 (two) times daily. Apply small amount to red areas on legs twice daily. 45 g 1   diphenhydramine-acetaminophen (TYLENOL PM) 25-500 MG TABS tablet Take 1 tablet by mouth at bedtime as needed.     feeding supplement (ENSURE ENLIVE / ENSURE PLUS) LIQD Take 237 mLs by mouth 3 (three) times daily between meals.     furosemide (LASIX) 20 MG tablet Hold until outpatient followup due to acute kidney injury. 180 tablet 3   levothyroxine (SYNTHROID) 112 MCG tablet TAKE 1 TABLET BY MOUTH  DAILY 90 tablet 0   megestrol (MEGACE) 400 MG/10ML suspension Take 10 mLs (400 mg total) by mouth daily.  0    melatonin 5 MG TABS Take 1 tablet (5 mg total) by mouth at bedtime.  0   mometasone (ELOCON) 0.1 % cream USE AS DIRECTED 45 g 1   pregabalin (LYRICA) 25 MG capsule TAKE 1 CAPSULE BY MOUTH IN  THE MORNING AND 2 CAPSULES  IN THE EVENING 270 capsule 2   QUEtiapine (SEROQUEL) 25 MG tablet Take 1 tablet (25 mg total) by mouth at bedtime as needed (to help with sleep).     silver sulfADIAZINE (SILVADENE) 1 % cream Apply 1 application topically daily. 50 g 2   tamsulosin (FLOMAX) 0.4 MG CAPS capsule Take 1 capsule (0.4 mg total) by mouth daily after supper.     triamcinolone (KENALOG) 0.1 % Apply 1 application topically 3 (three) times daily. 30 g 1   vitamin B-12 (CYANOCOBALAMIN) 1000 MCG tablet Take 1 tablet (1,000  mcg total) by mouth daily. 30 tablet 5   No current facility-administered medications on file prior to visit.   Allergies  Allergen Reactions   Codeine Nausea And Vomiting   Percocet [Oxycodone-Acetaminophen] Nausea And Vomiting   Social History   Socioeconomic History   Marital status: Widowed    Spouse name: Not on file   Number of children: 4   Years of education: Not on file   Highest education level: Not on file  Occupational History   Occupation: retired  Tobacco Use   Smoking status: Former    Types: Cigars   Smokeless tobacco: Former    Quit date: 10/05/1979  Substance and Sexual Activity   Alcohol use: No   Drug use: No   Sexual activity: Not Currently  Other Topics Concern   Not on file  Social History Narrative   Children all live nearby and rotate staying with patient at night.    Wife died in 01-29-2020, dementia, age 73.   Social Determinants of Health   Financial Resource Strain: Low Risk  (09/24/2021)   Overall Financial Resource Strain (CARDIA)    Difficulty of Paying Living Expenses: Not hard at all  Food Insecurity: No Food Insecurity (09/24/2021)   Hunger Vital Sign    Worried About Running Out of Food in the Last Year: Never true    Ran Out of Food  in the Last Year: Never true  Transportation Needs: No Transportation Needs (09/24/2021)   PRAPARE - Hydrologist (Medical): No    Lack of Transportation (Non-Medical): No  Physical Activity: Insufficiently Active (09/24/2021)   Exercise Vital Sign    Days of Exercise per Week: 3 days    Minutes of Exercise per Session: 20 min  Stress: No Stress Concern Present (09/24/2021)   Power    Feeling of Stress : Not at all  Social Connections: Socially Isolated (09/24/2021)   Social Connection and Isolation Panel [NHANES]    Frequency of Communication with Friends and Family: More than three times a week    Frequency of Social Gatherings with Friends and Family: More than three times a week    Attends Religious Services: Never    Marine scientist or Organizations: No    Attends Archivist Meetings: Never    Marital Status: Widowed  Intimate Partner Violence: Not At Risk (09/24/2021)   Humiliation, Afraid, Rape, and Kick questionnaire    Fear of Current or Ex-Partner: No    Emotionally Abused: No    Physically Abused: No    Sexually Abused: No      Review of Systems  All other systems reviewed and are negative.      Objective:   Physical Exam Vitals reviewed.  Constitutional:      General: He is not in acute distress.    Appearance: He is well-developed. He is not diaphoretic.  Cardiovascular:     Rate and Rhythm: Normal rate and regular rhythm.     Pulses:          Dorsalis pedis pulses are 1+ on the right side.       Posterior tibial pulses are 1+ on the right side.     Heart sounds: Normal heart sounds. No murmur heard.    No friction rub. No gallop.  Pulmonary:     Effort: Pulmonary effort is normal. No respiratory distress.     Breath sounds: No stridor.  Rales present. No wheezing or rhonchi.  Chest:     Chest wall: No tenderness.  Musculoskeletal:      Lumbar back: No spasms, tenderness or bony tenderness. Normal range of motion.     Right hip: No tenderness. Normal range of motion. Decreased strength.     Left hip: No tenderness. Normal range of motion. Decreased strength.     Right lower leg: No edema.  Feet:     Right foot:     Skin integrity: Ulcer, skin breakdown and fissure present. No erythema, warmth or callus.  Skin:    Findings: No bruising or erythema.  Neurological:     Motor: No weakness.     Coordination: Coordination normal.     Gait: Gait abnormal.           Assessment & Plan:  Respiratory crackles at right lung base - Plan: DG Chest 2 View, COMPLETE METABOLIC PANEL WITH GFR, CBC with Differential/Platelet  Weakness of both legs  Leg weakness, bilateral  Stage 3b chronic kidney disease (HCC)  Acute metabolic encephalopathy  Other acute osteomyelitis of right foot (HCC) Regarding his leg weakness, physical therapy is coming to his home today to work with him.  Regarding the osteomyelitis of the right foot, he has been off antibiotics now for 2 weeks without any fever or delirium.  He is following up with the wound center.  Will defer to their management.  Regarding encephalopathy, this seems to have resolved.  The question was whether the patient had pneumonia or possibly urinary tract infection.  Even the right basilar crackles I will get a chest x-ray.  I will also check his renal function and his white blood cell count.  Based on his exam today he does not appear to be fluid overloaded so I do not feel that he needs to resume his Lasix.  Defer voiding trial to the decision of urology.

## 2022-05-27 ENCOUNTER — Ambulatory Visit (INDEPENDENT_AMBULATORY_CARE_PROVIDER_SITE_OTHER): Payer: Medicare Other

## 2022-05-27 DIAGNOSIS — I442 Atrioventricular block, complete: Secondary | ICD-10-CM

## 2022-05-27 DIAGNOSIS — J9601 Acute respiratory failure with hypoxia: Secondary | ICD-10-CM | POA: Diagnosis not present

## 2022-05-27 DIAGNOSIS — I872 Venous insufficiency (chronic) (peripheral): Secondary | ICD-10-CM | POA: Diagnosis not present

## 2022-05-27 DIAGNOSIS — J690 Pneumonitis due to inhalation of food and vomit: Secondary | ICD-10-CM | POA: Diagnosis not present

## 2022-05-27 DIAGNOSIS — L97512 Non-pressure chronic ulcer of other part of right foot with fat layer exposed: Secondary | ICD-10-CM | POA: Diagnosis not present

## 2022-05-27 DIAGNOSIS — I509 Heart failure, unspecified: Secondary | ICD-10-CM | POA: Diagnosis not present

## 2022-05-27 DIAGNOSIS — Z87891 Personal history of nicotine dependence: Secondary | ICD-10-CM | POA: Diagnosis not present

## 2022-05-27 DIAGNOSIS — Z950 Presence of cardiac pacemaker: Secondary | ICD-10-CM | POA: Diagnosis not present

## 2022-05-27 DIAGNOSIS — G9341 Metabolic encephalopathy: Secondary | ICD-10-CM | POA: Diagnosis not present

## 2022-05-27 DIAGNOSIS — M86671 Other chronic osteomyelitis, right ankle and foot: Secondary | ICD-10-CM | POA: Diagnosis not present

## 2022-05-27 DIAGNOSIS — R338 Other retention of urine: Secondary | ICD-10-CM | POA: Diagnosis not present

## 2022-05-27 DIAGNOSIS — I4819 Other persistent atrial fibrillation: Secondary | ICD-10-CM | POA: Diagnosis not present

## 2022-05-27 DIAGNOSIS — Z466 Encounter for fitting and adjustment of urinary device: Secondary | ICD-10-CM | POA: Diagnosis not present

## 2022-05-27 DIAGNOSIS — I739 Peripheral vascular disease, unspecified: Secondary | ICD-10-CM | POA: Diagnosis not present

## 2022-05-27 DIAGNOSIS — Z89421 Acquired absence of other right toe(s): Secondary | ICD-10-CM | POA: Diagnosis not present

## 2022-05-27 DIAGNOSIS — N179 Acute kidney failure, unspecified: Secondary | ICD-10-CM | POA: Diagnosis not present

## 2022-05-27 DIAGNOSIS — I130 Hypertensive heart and chronic kidney disease with heart failure and stage 1 through stage 4 chronic kidney disease, or unspecified chronic kidney disease: Secondary | ICD-10-CM | POA: Diagnosis not present

## 2022-05-27 DIAGNOSIS — Z8572 Personal history of non-Hodgkin lymphomas: Secondary | ICD-10-CM | POA: Diagnosis not present

## 2022-05-27 DIAGNOSIS — K219 Gastro-esophageal reflux disease without esophagitis: Secondary | ICD-10-CM | POA: Diagnosis not present

## 2022-05-27 DIAGNOSIS — N184 Chronic kidney disease, stage 4 (severe): Secondary | ICD-10-CM | POA: Diagnosis not present

## 2022-05-27 DIAGNOSIS — E039 Hypothyroidism, unspecified: Secondary | ICD-10-CM | POA: Diagnosis not present

## 2022-05-27 LAB — CUP PACEART REMOTE DEVICE CHECK
Battery Remaining Longevity: 109 mo
Battery Remaining Percentage: 79 %
Battery Voltage: 3.02 V
Brady Statistic RV Percent Paced: 96 %
Date Time Interrogation Session: 20230630020012
Implantable Lead Implant Date: 20210701
Implantable Lead Implant Date: 20210701
Implantable Lead Location: 753859
Implantable Lead Location: 753860
Implantable Pulse Generator Implant Date: 20210701
Lead Channel Impedance Value: 510 Ohm
Lead Channel Pacing Threshold Amplitude: 0.75 V
Lead Channel Pacing Threshold Pulse Width: 0.5 ms
Lead Channel Sensing Intrinsic Amplitude: 12 mV
Lead Channel Setting Pacing Amplitude: 1 V
Lead Channel Setting Pacing Pulse Width: 0.5 ms
Lead Channel Setting Sensing Sensitivity: 2 mV
Pulse Gen Model: 2272
Pulse Gen Serial Number: 3842377

## 2022-05-27 LAB — COMPLETE METABOLIC PANEL WITH GFR
AG Ratio: 1.2 (calc) (ref 1.0–2.5)
ALT: 14 U/L (ref 9–46)
AST: 18 U/L (ref 10–35)
Albumin: 3.7 g/dL (ref 3.6–5.1)
Alkaline phosphatase (APISO): 61 U/L (ref 35–144)
BUN/Creatinine Ratio: 15 (calc) (ref 6–22)
BUN: 19 mg/dL (ref 7–25)
CO2: 26 mmol/L (ref 20–32)
Calcium: 9 mg/dL (ref 8.6–10.3)
Chloride: 110 mmol/L (ref 98–110)
Creat: 1.23 mg/dL — ABNORMAL HIGH (ref 0.70–1.22)
Globulin: 3.2 g/dL (calc) (ref 1.9–3.7)
Glucose, Bld: 85 mg/dL (ref 65–99)
Potassium: 4.6 mmol/L (ref 3.5–5.3)
Sodium: 143 mmol/L (ref 135–146)
Total Bilirubin: 0.5 mg/dL (ref 0.2–1.2)
Total Protein: 6.9 g/dL (ref 6.1–8.1)
eGFR: 52 mL/min/{1.73_m2} — ABNORMAL LOW (ref >=60)

## 2022-05-27 LAB — CBC WITH DIFFERENTIAL/PLATELET
Absolute Monocytes: 742 cells/uL (ref 200–950)
Basophils Absolute: 32 cells/uL (ref 0–200)
Basophils Relative: 0.5 %
Eosinophils Absolute: 154 cells/uL (ref 15–500)
Eosinophils Relative: 2.4 %
HCT: 39.5 % (ref 38.5–50.0)
Hemoglobin: 13.1 g/dL — ABNORMAL LOW (ref 13.2–17.1)
Lymphs Abs: 2118 cells/uL (ref 850–3900)
MCH: 30.3 pg (ref 27.0–33.0)
MCHC: 33.2 g/dL (ref 32.0–36.0)
MCV: 91.2 fL (ref 80.0–100.0)
MPV: 10.3 fL (ref 7.5–12.5)
Monocytes Relative: 11.6 %
Neutro Abs: 3354 cells/uL (ref 1500–7800)
Neutrophils Relative %: 52.4 %
Platelets: 183 10*3/uL (ref 140–400)
RBC: 4.33 10*6/uL (ref 4.20–5.80)
RDW: 15.3 % — ABNORMAL HIGH (ref 11.0–15.0)
Total Lymphocyte: 33.1 %
WBC: 6.4 10*3/uL (ref 3.8–10.8)

## 2022-05-30 DIAGNOSIS — R338 Other retention of urine: Secondary | ICD-10-CM | POA: Diagnosis not present

## 2022-05-30 DIAGNOSIS — N179 Acute kidney failure, unspecified: Secondary | ICD-10-CM | POA: Diagnosis not present

## 2022-05-30 DIAGNOSIS — Z950 Presence of cardiac pacemaker: Secondary | ICD-10-CM | POA: Diagnosis not present

## 2022-05-30 DIAGNOSIS — N184 Chronic kidney disease, stage 4 (severe): Secondary | ICD-10-CM | POA: Diagnosis not present

## 2022-05-30 DIAGNOSIS — I130 Hypertensive heart and chronic kidney disease with heart failure and stage 1 through stage 4 chronic kidney disease, or unspecified chronic kidney disease: Secondary | ICD-10-CM | POA: Diagnosis not present

## 2022-05-30 DIAGNOSIS — M86671 Other chronic osteomyelitis, right ankle and foot: Secondary | ICD-10-CM | POA: Diagnosis not present

## 2022-05-30 DIAGNOSIS — L97512 Non-pressure chronic ulcer of other part of right foot with fat layer exposed: Secondary | ICD-10-CM | POA: Diagnosis not present

## 2022-05-30 DIAGNOSIS — I739 Peripheral vascular disease, unspecified: Secondary | ICD-10-CM | POA: Diagnosis not present

## 2022-05-30 DIAGNOSIS — I509 Heart failure, unspecified: Secondary | ICD-10-CM | POA: Diagnosis not present

## 2022-05-30 DIAGNOSIS — Z87891 Personal history of nicotine dependence: Secondary | ICD-10-CM | POA: Diagnosis not present

## 2022-05-30 DIAGNOSIS — I4819 Other persistent atrial fibrillation: Secondary | ICD-10-CM | POA: Diagnosis not present

## 2022-05-30 DIAGNOSIS — K219 Gastro-esophageal reflux disease without esophagitis: Secondary | ICD-10-CM | POA: Diagnosis not present

## 2022-05-30 DIAGNOSIS — Z466 Encounter for fitting and adjustment of urinary device: Secondary | ICD-10-CM | POA: Diagnosis not present

## 2022-05-30 DIAGNOSIS — E039 Hypothyroidism, unspecified: Secondary | ICD-10-CM | POA: Diagnosis not present

## 2022-05-30 DIAGNOSIS — I442 Atrioventricular block, complete: Secondary | ICD-10-CM | POA: Diagnosis not present

## 2022-05-30 DIAGNOSIS — Z89421 Acquired absence of other right toe(s): Secondary | ICD-10-CM | POA: Diagnosis not present

## 2022-05-30 DIAGNOSIS — G9341 Metabolic encephalopathy: Secondary | ICD-10-CM | POA: Diagnosis not present

## 2022-05-30 DIAGNOSIS — I872 Venous insufficiency (chronic) (peripheral): Secondary | ICD-10-CM | POA: Diagnosis not present

## 2022-05-30 DIAGNOSIS — J9601 Acute respiratory failure with hypoxia: Secondary | ICD-10-CM | POA: Diagnosis not present

## 2022-05-30 DIAGNOSIS — Z8572 Personal history of non-Hodgkin lymphomas: Secondary | ICD-10-CM | POA: Diagnosis not present

## 2022-05-30 DIAGNOSIS — J690 Pneumonitis due to inhalation of food and vomit: Secondary | ICD-10-CM | POA: Diagnosis not present

## 2022-06-01 DIAGNOSIS — J690 Pneumonitis due to inhalation of food and vomit: Secondary | ICD-10-CM | POA: Diagnosis not present

## 2022-06-01 DIAGNOSIS — Z89421 Acquired absence of other right toe(s): Secondary | ICD-10-CM | POA: Diagnosis not present

## 2022-06-01 DIAGNOSIS — N184 Chronic kidney disease, stage 4 (severe): Secondary | ICD-10-CM | POA: Diagnosis not present

## 2022-06-01 DIAGNOSIS — I509 Heart failure, unspecified: Secondary | ICD-10-CM | POA: Diagnosis not present

## 2022-06-01 DIAGNOSIS — J9601 Acute respiratory failure with hypoxia: Secondary | ICD-10-CM | POA: Diagnosis not present

## 2022-06-01 DIAGNOSIS — Z8572 Personal history of non-Hodgkin lymphomas: Secondary | ICD-10-CM | POA: Diagnosis not present

## 2022-06-01 DIAGNOSIS — I872 Venous insufficiency (chronic) (peripheral): Secondary | ICD-10-CM | POA: Diagnosis not present

## 2022-06-01 DIAGNOSIS — L97512 Non-pressure chronic ulcer of other part of right foot with fat layer exposed: Secondary | ICD-10-CM | POA: Diagnosis not present

## 2022-06-01 DIAGNOSIS — M86671 Other chronic osteomyelitis, right ankle and foot: Secondary | ICD-10-CM | POA: Diagnosis not present

## 2022-06-01 DIAGNOSIS — Z466 Encounter for fitting and adjustment of urinary device: Secondary | ICD-10-CM | POA: Diagnosis not present

## 2022-06-01 DIAGNOSIS — Z87891 Personal history of nicotine dependence: Secondary | ICD-10-CM | POA: Diagnosis not present

## 2022-06-01 DIAGNOSIS — I4819 Other persistent atrial fibrillation: Secondary | ICD-10-CM | POA: Diagnosis not present

## 2022-06-01 DIAGNOSIS — N179 Acute kidney failure, unspecified: Secondary | ICD-10-CM | POA: Diagnosis not present

## 2022-06-01 DIAGNOSIS — G9341 Metabolic encephalopathy: Secondary | ICD-10-CM | POA: Diagnosis not present

## 2022-06-01 DIAGNOSIS — I442 Atrioventricular block, complete: Secondary | ICD-10-CM | POA: Diagnosis not present

## 2022-06-01 DIAGNOSIS — Z950 Presence of cardiac pacemaker: Secondary | ICD-10-CM | POA: Diagnosis not present

## 2022-06-01 DIAGNOSIS — I739 Peripheral vascular disease, unspecified: Secondary | ICD-10-CM | POA: Diagnosis not present

## 2022-06-01 DIAGNOSIS — R338 Other retention of urine: Secondary | ICD-10-CM | POA: Diagnosis not present

## 2022-06-01 DIAGNOSIS — I130 Hypertensive heart and chronic kidney disease with heart failure and stage 1 through stage 4 chronic kidney disease, or unspecified chronic kidney disease: Secondary | ICD-10-CM | POA: Diagnosis not present

## 2022-06-01 DIAGNOSIS — K219 Gastro-esophageal reflux disease without esophagitis: Secondary | ICD-10-CM | POA: Diagnosis not present

## 2022-06-01 DIAGNOSIS — E039 Hypothyroidism, unspecified: Secondary | ICD-10-CM | POA: Diagnosis not present

## 2022-06-02 DIAGNOSIS — J9601 Acute respiratory failure with hypoxia: Secondary | ICD-10-CM | POA: Diagnosis not present

## 2022-06-02 DIAGNOSIS — I130 Hypertensive heart and chronic kidney disease with heart failure and stage 1 through stage 4 chronic kidney disease, or unspecified chronic kidney disease: Secondary | ICD-10-CM | POA: Diagnosis not present

## 2022-06-02 DIAGNOSIS — I872 Venous insufficiency (chronic) (peripheral): Secondary | ICD-10-CM | POA: Diagnosis not present

## 2022-06-02 DIAGNOSIS — I739 Peripheral vascular disease, unspecified: Secondary | ICD-10-CM | POA: Diagnosis not present

## 2022-06-02 DIAGNOSIS — K219 Gastro-esophageal reflux disease without esophagitis: Secondary | ICD-10-CM | POA: Diagnosis not present

## 2022-06-02 DIAGNOSIS — G9341 Metabolic encephalopathy: Secondary | ICD-10-CM | POA: Diagnosis not present

## 2022-06-02 DIAGNOSIS — E039 Hypothyroidism, unspecified: Secondary | ICD-10-CM | POA: Diagnosis not present

## 2022-06-02 DIAGNOSIS — J690 Pneumonitis due to inhalation of food and vomit: Secondary | ICD-10-CM | POA: Diagnosis not present

## 2022-06-02 DIAGNOSIS — I509 Heart failure, unspecified: Secondary | ICD-10-CM | POA: Diagnosis not present

## 2022-06-02 DIAGNOSIS — N184 Chronic kidney disease, stage 4 (severe): Secondary | ICD-10-CM | POA: Diagnosis not present

## 2022-06-02 DIAGNOSIS — M86671 Other chronic osteomyelitis, right ankle and foot: Secondary | ICD-10-CM | POA: Diagnosis not present

## 2022-06-02 DIAGNOSIS — R338 Other retention of urine: Secondary | ICD-10-CM | POA: Diagnosis not present

## 2022-06-02 DIAGNOSIS — L97512 Non-pressure chronic ulcer of other part of right foot with fat layer exposed: Secondary | ICD-10-CM | POA: Diagnosis not present

## 2022-06-02 DIAGNOSIS — I442 Atrioventricular block, complete: Secondary | ICD-10-CM | POA: Diagnosis not present

## 2022-06-02 DIAGNOSIS — Z8572 Personal history of non-Hodgkin lymphomas: Secondary | ICD-10-CM | POA: Diagnosis not present

## 2022-06-02 DIAGNOSIS — I4819 Other persistent atrial fibrillation: Secondary | ICD-10-CM | POA: Diagnosis not present

## 2022-06-02 DIAGNOSIS — Z950 Presence of cardiac pacemaker: Secondary | ICD-10-CM | POA: Diagnosis not present

## 2022-06-02 DIAGNOSIS — Z89421 Acquired absence of other right toe(s): Secondary | ICD-10-CM | POA: Diagnosis not present

## 2022-06-02 DIAGNOSIS — N179 Acute kidney failure, unspecified: Secondary | ICD-10-CM | POA: Diagnosis not present

## 2022-06-02 DIAGNOSIS — Z87891 Personal history of nicotine dependence: Secondary | ICD-10-CM | POA: Diagnosis not present

## 2022-06-02 DIAGNOSIS — Z466 Encounter for fitting and adjustment of urinary device: Secondary | ICD-10-CM | POA: Diagnosis not present

## 2022-06-03 DIAGNOSIS — E039 Hypothyroidism, unspecified: Secondary | ICD-10-CM | POA: Diagnosis not present

## 2022-06-03 DIAGNOSIS — M86671 Other chronic osteomyelitis, right ankle and foot: Secondary | ICD-10-CM | POA: Diagnosis not present

## 2022-06-03 DIAGNOSIS — J690 Pneumonitis due to inhalation of food and vomit: Secondary | ICD-10-CM | POA: Diagnosis not present

## 2022-06-03 DIAGNOSIS — R338 Other retention of urine: Secondary | ICD-10-CM | POA: Diagnosis not present

## 2022-06-03 DIAGNOSIS — L97512 Non-pressure chronic ulcer of other part of right foot with fat layer exposed: Secondary | ICD-10-CM | POA: Diagnosis not present

## 2022-06-03 DIAGNOSIS — Z466 Encounter for fitting and adjustment of urinary device: Secondary | ICD-10-CM | POA: Diagnosis not present

## 2022-06-03 DIAGNOSIS — Z89421 Acquired absence of other right toe(s): Secondary | ICD-10-CM | POA: Diagnosis not present

## 2022-06-03 DIAGNOSIS — N184 Chronic kidney disease, stage 4 (severe): Secondary | ICD-10-CM | POA: Diagnosis not present

## 2022-06-03 DIAGNOSIS — I739 Peripheral vascular disease, unspecified: Secondary | ICD-10-CM | POA: Diagnosis not present

## 2022-06-03 DIAGNOSIS — Z950 Presence of cardiac pacemaker: Secondary | ICD-10-CM | POA: Diagnosis not present

## 2022-06-03 DIAGNOSIS — K219 Gastro-esophageal reflux disease without esophagitis: Secondary | ICD-10-CM | POA: Diagnosis not present

## 2022-06-03 DIAGNOSIS — G9341 Metabolic encephalopathy: Secondary | ICD-10-CM | POA: Diagnosis not present

## 2022-06-03 DIAGNOSIS — J9601 Acute respiratory failure with hypoxia: Secondary | ICD-10-CM | POA: Diagnosis not present

## 2022-06-03 DIAGNOSIS — Z8572 Personal history of non-Hodgkin lymphomas: Secondary | ICD-10-CM | POA: Diagnosis not present

## 2022-06-03 DIAGNOSIS — I130 Hypertensive heart and chronic kidney disease with heart failure and stage 1 through stage 4 chronic kidney disease, or unspecified chronic kidney disease: Secondary | ICD-10-CM | POA: Diagnosis not present

## 2022-06-03 DIAGNOSIS — I872 Venous insufficiency (chronic) (peripheral): Secondary | ICD-10-CM | POA: Diagnosis not present

## 2022-06-03 DIAGNOSIS — I4819 Other persistent atrial fibrillation: Secondary | ICD-10-CM | POA: Diagnosis not present

## 2022-06-03 DIAGNOSIS — Z87891 Personal history of nicotine dependence: Secondary | ICD-10-CM | POA: Diagnosis not present

## 2022-06-03 DIAGNOSIS — I442 Atrioventricular block, complete: Secondary | ICD-10-CM | POA: Diagnosis not present

## 2022-06-03 DIAGNOSIS — N179 Acute kidney failure, unspecified: Secondary | ICD-10-CM | POA: Diagnosis not present

## 2022-06-03 DIAGNOSIS — I509 Heart failure, unspecified: Secondary | ICD-10-CM | POA: Diagnosis not present

## 2022-06-06 ENCOUNTER — Encounter: Payer: Self-pay | Admitting: Family Medicine

## 2022-06-06 DIAGNOSIS — K219 Gastro-esophageal reflux disease without esophagitis: Secondary | ICD-10-CM | POA: Diagnosis not present

## 2022-06-06 DIAGNOSIS — Z89421 Acquired absence of other right toe(s): Secondary | ICD-10-CM | POA: Diagnosis not present

## 2022-06-06 DIAGNOSIS — I442 Atrioventricular block, complete: Secondary | ICD-10-CM | POA: Diagnosis not present

## 2022-06-06 DIAGNOSIS — Z466 Encounter for fitting and adjustment of urinary device: Secondary | ICD-10-CM | POA: Diagnosis not present

## 2022-06-06 DIAGNOSIS — G9341 Metabolic encephalopathy: Secondary | ICD-10-CM | POA: Diagnosis not present

## 2022-06-06 DIAGNOSIS — Z8572 Personal history of non-Hodgkin lymphomas: Secondary | ICD-10-CM | POA: Diagnosis not present

## 2022-06-06 DIAGNOSIS — I4819 Other persistent atrial fibrillation: Secondary | ICD-10-CM | POA: Diagnosis not present

## 2022-06-06 DIAGNOSIS — I739 Peripheral vascular disease, unspecified: Secondary | ICD-10-CM | POA: Diagnosis not present

## 2022-06-06 DIAGNOSIS — M86671 Other chronic osteomyelitis, right ankle and foot: Secondary | ICD-10-CM | POA: Diagnosis not present

## 2022-06-06 DIAGNOSIS — N179 Acute kidney failure, unspecified: Secondary | ICD-10-CM | POA: Diagnosis not present

## 2022-06-06 DIAGNOSIS — J9601 Acute respiratory failure with hypoxia: Secondary | ICD-10-CM | POA: Diagnosis not present

## 2022-06-06 DIAGNOSIS — I872 Venous insufficiency (chronic) (peripheral): Secondary | ICD-10-CM | POA: Diagnosis not present

## 2022-06-06 DIAGNOSIS — I130 Hypertensive heart and chronic kidney disease with heart failure and stage 1 through stage 4 chronic kidney disease, or unspecified chronic kidney disease: Secondary | ICD-10-CM | POA: Diagnosis not present

## 2022-06-06 DIAGNOSIS — N184 Chronic kidney disease, stage 4 (severe): Secondary | ICD-10-CM | POA: Diagnosis not present

## 2022-06-06 DIAGNOSIS — Z950 Presence of cardiac pacemaker: Secondary | ICD-10-CM | POA: Diagnosis not present

## 2022-06-06 DIAGNOSIS — Z87891 Personal history of nicotine dependence: Secondary | ICD-10-CM | POA: Diagnosis not present

## 2022-06-06 DIAGNOSIS — I509 Heart failure, unspecified: Secondary | ICD-10-CM | POA: Diagnosis not present

## 2022-06-06 DIAGNOSIS — J690 Pneumonitis due to inhalation of food and vomit: Secondary | ICD-10-CM | POA: Diagnosis not present

## 2022-06-06 DIAGNOSIS — R338 Other retention of urine: Secondary | ICD-10-CM | POA: Diagnosis not present

## 2022-06-06 DIAGNOSIS — L97512 Non-pressure chronic ulcer of other part of right foot with fat layer exposed: Secondary | ICD-10-CM | POA: Diagnosis not present

## 2022-06-06 DIAGNOSIS — E039 Hypothyroidism, unspecified: Secondary | ICD-10-CM | POA: Diagnosis not present

## 2022-06-08 DIAGNOSIS — M86671 Other chronic osteomyelitis, right ankle and foot: Secondary | ICD-10-CM | POA: Diagnosis not present

## 2022-06-08 DIAGNOSIS — N179 Acute kidney failure, unspecified: Secondary | ICD-10-CM | POA: Diagnosis not present

## 2022-06-08 DIAGNOSIS — R338 Other retention of urine: Secondary | ICD-10-CM | POA: Diagnosis not present

## 2022-06-08 DIAGNOSIS — Z950 Presence of cardiac pacemaker: Secondary | ICD-10-CM | POA: Diagnosis not present

## 2022-06-08 DIAGNOSIS — I4819 Other persistent atrial fibrillation: Secondary | ICD-10-CM | POA: Diagnosis not present

## 2022-06-08 DIAGNOSIS — Z466 Encounter for fitting and adjustment of urinary device: Secondary | ICD-10-CM | POA: Diagnosis not present

## 2022-06-08 DIAGNOSIS — I130 Hypertensive heart and chronic kidney disease with heart failure and stage 1 through stage 4 chronic kidney disease, or unspecified chronic kidney disease: Secondary | ICD-10-CM | POA: Diagnosis not present

## 2022-06-08 DIAGNOSIS — I739 Peripheral vascular disease, unspecified: Secondary | ICD-10-CM | POA: Diagnosis not present

## 2022-06-08 DIAGNOSIS — I442 Atrioventricular block, complete: Secondary | ICD-10-CM | POA: Diagnosis not present

## 2022-06-08 DIAGNOSIS — K219 Gastro-esophageal reflux disease without esophagitis: Secondary | ICD-10-CM | POA: Diagnosis not present

## 2022-06-08 DIAGNOSIS — Z87891 Personal history of nicotine dependence: Secondary | ICD-10-CM | POA: Diagnosis not present

## 2022-06-08 DIAGNOSIS — Z8572 Personal history of non-Hodgkin lymphomas: Secondary | ICD-10-CM | POA: Diagnosis not present

## 2022-06-08 DIAGNOSIS — Z89421 Acquired absence of other right toe(s): Secondary | ICD-10-CM | POA: Diagnosis not present

## 2022-06-08 DIAGNOSIS — L97512 Non-pressure chronic ulcer of other part of right foot with fat layer exposed: Secondary | ICD-10-CM | POA: Diagnosis not present

## 2022-06-08 DIAGNOSIS — G9341 Metabolic encephalopathy: Secondary | ICD-10-CM | POA: Diagnosis not present

## 2022-06-08 DIAGNOSIS — I509 Heart failure, unspecified: Secondary | ICD-10-CM | POA: Diagnosis not present

## 2022-06-08 DIAGNOSIS — I872 Venous insufficiency (chronic) (peripheral): Secondary | ICD-10-CM | POA: Diagnosis not present

## 2022-06-08 DIAGNOSIS — J690 Pneumonitis due to inhalation of food and vomit: Secondary | ICD-10-CM | POA: Diagnosis not present

## 2022-06-08 DIAGNOSIS — N184 Chronic kidney disease, stage 4 (severe): Secondary | ICD-10-CM | POA: Diagnosis not present

## 2022-06-08 DIAGNOSIS — J9601 Acute respiratory failure with hypoxia: Secondary | ICD-10-CM | POA: Diagnosis not present

## 2022-06-08 DIAGNOSIS — E039 Hypothyroidism, unspecified: Secondary | ICD-10-CM | POA: Diagnosis not present

## 2022-06-09 ENCOUNTER — Telehealth: Payer: Self-pay

## 2022-06-09 DIAGNOSIS — N179 Acute kidney failure, unspecified: Secondary | ICD-10-CM | POA: Diagnosis not present

## 2022-06-09 DIAGNOSIS — E039 Hypothyroidism, unspecified: Secondary | ICD-10-CM | POA: Diagnosis not present

## 2022-06-09 DIAGNOSIS — K219 Gastro-esophageal reflux disease without esophagitis: Secondary | ICD-10-CM | POA: Diagnosis not present

## 2022-06-09 DIAGNOSIS — J690 Pneumonitis due to inhalation of food and vomit: Secondary | ICD-10-CM | POA: Diagnosis not present

## 2022-06-09 DIAGNOSIS — I130 Hypertensive heart and chronic kidney disease with heart failure and stage 1 through stage 4 chronic kidney disease, or unspecified chronic kidney disease: Secondary | ICD-10-CM | POA: Diagnosis not present

## 2022-06-09 DIAGNOSIS — N184 Chronic kidney disease, stage 4 (severe): Secondary | ICD-10-CM | POA: Diagnosis not present

## 2022-06-09 DIAGNOSIS — G9341 Metabolic encephalopathy: Secondary | ICD-10-CM | POA: Diagnosis not present

## 2022-06-09 DIAGNOSIS — Z8572 Personal history of non-Hodgkin lymphomas: Secondary | ICD-10-CM | POA: Diagnosis not present

## 2022-06-09 DIAGNOSIS — Z466 Encounter for fitting and adjustment of urinary device: Secondary | ICD-10-CM | POA: Diagnosis not present

## 2022-06-09 DIAGNOSIS — I4819 Other persistent atrial fibrillation: Secondary | ICD-10-CM | POA: Diagnosis not present

## 2022-06-09 DIAGNOSIS — M86671 Other chronic osteomyelitis, right ankle and foot: Secondary | ICD-10-CM | POA: Diagnosis not present

## 2022-06-09 DIAGNOSIS — R338 Other retention of urine: Secondary | ICD-10-CM | POA: Diagnosis not present

## 2022-06-09 DIAGNOSIS — J9601 Acute respiratory failure with hypoxia: Secondary | ICD-10-CM | POA: Diagnosis not present

## 2022-06-09 DIAGNOSIS — I739 Peripheral vascular disease, unspecified: Secondary | ICD-10-CM | POA: Diagnosis not present

## 2022-06-09 DIAGNOSIS — Z87891 Personal history of nicotine dependence: Secondary | ICD-10-CM | POA: Diagnosis not present

## 2022-06-09 DIAGNOSIS — Z950 Presence of cardiac pacemaker: Secondary | ICD-10-CM | POA: Diagnosis not present

## 2022-06-09 DIAGNOSIS — I442 Atrioventricular block, complete: Secondary | ICD-10-CM | POA: Diagnosis not present

## 2022-06-09 DIAGNOSIS — L97512 Non-pressure chronic ulcer of other part of right foot with fat layer exposed: Secondary | ICD-10-CM | POA: Diagnosis not present

## 2022-06-09 DIAGNOSIS — I872 Venous insufficiency (chronic) (peripheral): Secondary | ICD-10-CM | POA: Diagnosis not present

## 2022-06-09 DIAGNOSIS — Z89421 Acquired absence of other right toe(s): Secondary | ICD-10-CM | POA: Diagnosis not present

## 2022-06-09 DIAGNOSIS — I509 Heart failure, unspecified: Secondary | ICD-10-CM | POA: Diagnosis not present

## 2022-06-09 NOTE — Progress Notes (Signed)
Remote pacemaker transmission.   

## 2022-06-09 NOTE — Telephone Encounter (Signed)
Pt's daughter, [name] called stated that they would a Rx for hospital due because when pt is on his side he can't not breath. Pt needs his head lift up, suggested by [facility] nurse.   Please send Rx to [facility] in [place]

## 2022-06-10 DIAGNOSIS — R338 Other retention of urine: Secondary | ICD-10-CM | POA: Diagnosis not present

## 2022-06-13 ENCOUNTER — Telehealth: Payer: Self-pay

## 2022-06-13 ENCOUNTER — Other Ambulatory Visit: Payer: Self-pay | Admitting: Family Medicine

## 2022-06-13 DIAGNOSIS — I4819 Other persistent atrial fibrillation: Secondary | ICD-10-CM | POA: Diagnosis not present

## 2022-06-13 DIAGNOSIS — N184 Chronic kidney disease, stage 4 (severe): Secondary | ICD-10-CM | POA: Diagnosis not present

## 2022-06-13 DIAGNOSIS — K219 Gastro-esophageal reflux disease without esophagitis: Secondary | ICD-10-CM | POA: Diagnosis not present

## 2022-06-13 DIAGNOSIS — I739 Peripheral vascular disease, unspecified: Secondary | ICD-10-CM | POA: Diagnosis not present

## 2022-06-13 DIAGNOSIS — Z89421 Acquired absence of other right toe(s): Secondary | ICD-10-CM | POA: Diagnosis not present

## 2022-06-13 DIAGNOSIS — Z87891 Personal history of nicotine dependence: Secondary | ICD-10-CM | POA: Diagnosis not present

## 2022-06-13 DIAGNOSIS — I130 Hypertensive heart and chronic kidney disease with heart failure and stage 1 through stage 4 chronic kidney disease, or unspecified chronic kidney disease: Secondary | ICD-10-CM | POA: Diagnosis not present

## 2022-06-13 DIAGNOSIS — E039 Hypothyroidism, unspecified: Secondary | ICD-10-CM | POA: Diagnosis not present

## 2022-06-13 DIAGNOSIS — J9601 Acute respiratory failure with hypoxia: Secondary | ICD-10-CM | POA: Diagnosis not present

## 2022-06-13 DIAGNOSIS — Z466 Encounter for fitting and adjustment of urinary device: Secondary | ICD-10-CM | POA: Diagnosis not present

## 2022-06-13 DIAGNOSIS — I509 Heart failure, unspecified: Secondary | ICD-10-CM | POA: Diagnosis not present

## 2022-06-13 DIAGNOSIS — J690 Pneumonitis due to inhalation of food and vomit: Secondary | ICD-10-CM | POA: Diagnosis not present

## 2022-06-13 DIAGNOSIS — I872 Venous insufficiency (chronic) (peripheral): Secondary | ICD-10-CM | POA: Diagnosis not present

## 2022-06-13 DIAGNOSIS — M86671 Other chronic osteomyelitis, right ankle and foot: Secondary | ICD-10-CM | POA: Diagnosis not present

## 2022-06-13 DIAGNOSIS — I442 Atrioventricular block, complete: Secondary | ICD-10-CM | POA: Diagnosis not present

## 2022-06-13 DIAGNOSIS — L97512 Non-pressure chronic ulcer of other part of right foot with fat layer exposed: Secondary | ICD-10-CM | POA: Diagnosis not present

## 2022-06-13 DIAGNOSIS — G9341 Metabolic encephalopathy: Secondary | ICD-10-CM | POA: Diagnosis not present

## 2022-06-13 DIAGNOSIS — Z950 Presence of cardiac pacemaker: Secondary | ICD-10-CM | POA: Diagnosis not present

## 2022-06-13 DIAGNOSIS — N179 Acute kidney failure, unspecified: Secondary | ICD-10-CM | POA: Diagnosis not present

## 2022-06-13 DIAGNOSIS — R338 Other retention of urine: Secondary | ICD-10-CM | POA: Diagnosis not present

## 2022-06-13 DIAGNOSIS — Z8572 Personal history of non-Hodgkin lymphomas: Secondary | ICD-10-CM | POA: Diagnosis not present

## 2022-06-13 MED ORDER — CEPHALEXIN 500 MG PO CAPS: 500.0000 mg | ORAL_CAPSULE | Freq: Three times a day (TID) | ORAL | 0 refills | Status: DC

## 2022-06-13 NOTE — Telephone Encounter (Signed)
Given to front desk to faxed to [facility]

## 2022-06-13 NOTE — Telephone Encounter (Signed)
Pt's daughter called stating that pt is starting to see things again ie tomatoes. Pt's urine is so red, looks like blood per pt. Nurse that came and change pt's foley said that pt might have a UTI. Daughter would like to know if yo can fax in Rx for a UTI/antibiotics to [facility]   Please advice  Fax  [phone]

## 2022-06-14 DIAGNOSIS — K219 Gastro-esophageal reflux disease without esophagitis: Secondary | ICD-10-CM | POA: Diagnosis not present

## 2022-06-14 DIAGNOSIS — Z89421 Acquired absence of other right toe(s): Secondary | ICD-10-CM | POA: Diagnosis not present

## 2022-06-14 DIAGNOSIS — I509 Heart failure, unspecified: Secondary | ICD-10-CM | POA: Diagnosis not present

## 2022-06-14 DIAGNOSIS — I4819 Other persistent atrial fibrillation: Secondary | ICD-10-CM | POA: Diagnosis not present

## 2022-06-14 DIAGNOSIS — N184 Chronic kidney disease, stage 4 (severe): Secondary | ICD-10-CM | POA: Diagnosis not present

## 2022-06-14 DIAGNOSIS — Z87891 Personal history of nicotine dependence: Secondary | ICD-10-CM | POA: Diagnosis not present

## 2022-06-14 DIAGNOSIS — I739 Peripheral vascular disease, unspecified: Secondary | ICD-10-CM | POA: Diagnosis not present

## 2022-06-14 DIAGNOSIS — E039 Hypothyroidism, unspecified: Secondary | ICD-10-CM | POA: Diagnosis not present

## 2022-06-14 DIAGNOSIS — G9341 Metabolic encephalopathy: Secondary | ICD-10-CM | POA: Diagnosis not present

## 2022-06-14 DIAGNOSIS — N179 Acute kidney failure, unspecified: Secondary | ICD-10-CM | POA: Diagnosis not present

## 2022-06-14 DIAGNOSIS — R338 Other retention of urine: Secondary | ICD-10-CM | POA: Diagnosis not present

## 2022-06-14 DIAGNOSIS — I872 Venous insufficiency (chronic) (peripheral): Secondary | ICD-10-CM | POA: Diagnosis not present

## 2022-06-14 DIAGNOSIS — Z950 Presence of cardiac pacemaker: Secondary | ICD-10-CM | POA: Diagnosis not present

## 2022-06-14 DIAGNOSIS — Z466 Encounter for fitting and adjustment of urinary device: Secondary | ICD-10-CM | POA: Diagnosis not present

## 2022-06-14 DIAGNOSIS — L97512 Non-pressure chronic ulcer of other part of right foot with fat layer exposed: Secondary | ICD-10-CM | POA: Diagnosis not present

## 2022-06-14 DIAGNOSIS — M86671 Other chronic osteomyelitis, right ankle and foot: Secondary | ICD-10-CM | POA: Diagnosis not present

## 2022-06-14 DIAGNOSIS — J9601 Acute respiratory failure with hypoxia: Secondary | ICD-10-CM | POA: Diagnosis not present

## 2022-06-14 DIAGNOSIS — I130 Hypertensive heart and chronic kidney disease with heart failure and stage 1 through stage 4 chronic kidney disease, or unspecified chronic kidney disease: Secondary | ICD-10-CM | POA: Diagnosis not present

## 2022-06-14 DIAGNOSIS — Z8572 Personal history of non-Hodgkin lymphomas: Secondary | ICD-10-CM | POA: Diagnosis not present

## 2022-06-14 DIAGNOSIS — I442 Atrioventricular block, complete: Secondary | ICD-10-CM | POA: Diagnosis not present

## 2022-06-14 DIAGNOSIS — J690 Pneumonitis due to inhalation of food and vomit: Secondary | ICD-10-CM | POA: Diagnosis not present

## 2022-06-14 NOTE — Telephone Encounter (Signed)
Notify pt via MyChart regarding meds.

## 2022-06-16 DIAGNOSIS — I130 Hypertensive heart and chronic kidney disease with heart failure and stage 1 through stage 4 chronic kidney disease, or unspecified chronic kidney disease: Secondary | ICD-10-CM | POA: Diagnosis not present

## 2022-06-16 DIAGNOSIS — Z89421 Acquired absence of other right toe(s): Secondary | ICD-10-CM | POA: Diagnosis not present

## 2022-06-16 DIAGNOSIS — R338 Other retention of urine: Secondary | ICD-10-CM | POA: Diagnosis not present

## 2022-06-16 DIAGNOSIS — Z950 Presence of cardiac pacemaker: Secondary | ICD-10-CM | POA: Diagnosis not present

## 2022-06-16 DIAGNOSIS — I442 Atrioventricular block, complete: Secondary | ICD-10-CM | POA: Diagnosis not present

## 2022-06-16 DIAGNOSIS — I509 Heart failure, unspecified: Secondary | ICD-10-CM | POA: Diagnosis not present

## 2022-06-16 DIAGNOSIS — N184 Chronic kidney disease, stage 4 (severe): Secondary | ICD-10-CM | POA: Diagnosis not present

## 2022-06-16 DIAGNOSIS — K219 Gastro-esophageal reflux disease without esophagitis: Secondary | ICD-10-CM | POA: Diagnosis not present

## 2022-06-16 DIAGNOSIS — Z466 Encounter for fitting and adjustment of urinary device: Secondary | ICD-10-CM | POA: Diagnosis not present

## 2022-06-16 DIAGNOSIS — J690 Pneumonitis due to inhalation of food and vomit: Secondary | ICD-10-CM | POA: Diagnosis not present

## 2022-06-16 DIAGNOSIS — J9601 Acute respiratory failure with hypoxia: Secondary | ICD-10-CM | POA: Diagnosis not present

## 2022-06-16 DIAGNOSIS — L97512 Non-pressure chronic ulcer of other part of right foot with fat layer exposed: Secondary | ICD-10-CM | POA: Diagnosis not present

## 2022-06-16 DIAGNOSIS — I4819 Other persistent atrial fibrillation: Secondary | ICD-10-CM | POA: Diagnosis not present

## 2022-06-16 DIAGNOSIS — M86671 Other chronic osteomyelitis, right ankle and foot: Secondary | ICD-10-CM | POA: Diagnosis not present

## 2022-06-16 DIAGNOSIS — I739 Peripheral vascular disease, unspecified: Secondary | ICD-10-CM | POA: Diagnosis not present

## 2022-06-16 DIAGNOSIS — N179 Acute kidney failure, unspecified: Secondary | ICD-10-CM | POA: Diagnosis not present

## 2022-06-16 DIAGNOSIS — Z87891 Personal history of nicotine dependence: Secondary | ICD-10-CM | POA: Diagnosis not present

## 2022-06-16 DIAGNOSIS — G9341 Metabolic encephalopathy: Secondary | ICD-10-CM | POA: Diagnosis not present

## 2022-06-16 DIAGNOSIS — Z8572 Personal history of non-Hodgkin lymphomas: Secondary | ICD-10-CM | POA: Diagnosis not present

## 2022-06-16 DIAGNOSIS — E039 Hypothyroidism, unspecified: Secondary | ICD-10-CM | POA: Diagnosis not present

## 2022-06-16 DIAGNOSIS — I872 Venous insufficiency (chronic) (peripheral): Secondary | ICD-10-CM | POA: Diagnosis not present

## 2022-06-17 DIAGNOSIS — I509 Heart failure, unspecified: Secondary | ICD-10-CM | POA: Diagnosis not present

## 2022-06-17 DIAGNOSIS — I130 Hypertensive heart and chronic kidney disease with heart failure and stage 1 through stage 4 chronic kidney disease, or unspecified chronic kidney disease: Secondary | ICD-10-CM | POA: Diagnosis not present

## 2022-06-17 DIAGNOSIS — Z950 Presence of cardiac pacemaker: Secondary | ICD-10-CM | POA: Diagnosis not present

## 2022-06-17 DIAGNOSIS — J9601 Acute respiratory failure with hypoxia: Secondary | ICD-10-CM | POA: Diagnosis not present

## 2022-06-17 DIAGNOSIS — M86671 Other chronic osteomyelitis, right ankle and foot: Secondary | ICD-10-CM | POA: Diagnosis not present

## 2022-06-17 DIAGNOSIS — K219 Gastro-esophageal reflux disease without esophagitis: Secondary | ICD-10-CM | POA: Diagnosis not present

## 2022-06-17 DIAGNOSIS — I442 Atrioventricular block, complete: Secondary | ICD-10-CM | POA: Diagnosis not present

## 2022-06-17 DIAGNOSIS — Z466 Encounter for fitting and adjustment of urinary device: Secondary | ICD-10-CM | POA: Diagnosis not present

## 2022-06-17 DIAGNOSIS — E039 Hypothyroidism, unspecified: Secondary | ICD-10-CM | POA: Diagnosis not present

## 2022-06-17 DIAGNOSIS — I872 Venous insufficiency (chronic) (peripheral): Secondary | ICD-10-CM | POA: Diagnosis not present

## 2022-06-17 DIAGNOSIS — I4819 Other persistent atrial fibrillation: Secondary | ICD-10-CM | POA: Diagnosis not present

## 2022-06-17 DIAGNOSIS — G9341 Metabolic encephalopathy: Secondary | ICD-10-CM | POA: Diagnosis not present

## 2022-06-17 DIAGNOSIS — Z89421 Acquired absence of other right toe(s): Secondary | ICD-10-CM | POA: Diagnosis not present

## 2022-06-17 DIAGNOSIS — N179 Acute kidney failure, unspecified: Secondary | ICD-10-CM | POA: Diagnosis not present

## 2022-06-17 DIAGNOSIS — Z8572 Personal history of non-Hodgkin lymphomas: Secondary | ICD-10-CM | POA: Diagnosis not present

## 2022-06-17 DIAGNOSIS — I739 Peripheral vascular disease, unspecified: Secondary | ICD-10-CM | POA: Diagnosis not present

## 2022-06-17 DIAGNOSIS — R338 Other retention of urine: Secondary | ICD-10-CM | POA: Diagnosis not present

## 2022-06-17 DIAGNOSIS — L97512 Non-pressure chronic ulcer of other part of right foot with fat layer exposed: Secondary | ICD-10-CM | POA: Diagnosis not present

## 2022-06-17 DIAGNOSIS — L89893 Pressure ulcer of other site, stage 3: Secondary | ICD-10-CM | POA: Diagnosis not present

## 2022-06-17 DIAGNOSIS — N184 Chronic kidney disease, stage 4 (severe): Secondary | ICD-10-CM | POA: Diagnosis not present

## 2022-06-17 DIAGNOSIS — Z87891 Personal history of nicotine dependence: Secondary | ICD-10-CM | POA: Diagnosis not present

## 2022-06-17 DIAGNOSIS — J690 Pneumonitis due to inhalation of food and vomit: Secondary | ICD-10-CM | POA: Diagnosis not present

## 2022-06-18 ENCOUNTER — Other Ambulatory Visit: Payer: Self-pay | Admitting: Family Medicine

## 2022-06-20 DIAGNOSIS — I739 Peripheral vascular disease, unspecified: Secondary | ICD-10-CM | POA: Diagnosis not present

## 2022-06-20 DIAGNOSIS — J690 Pneumonitis due to inhalation of food and vomit: Secondary | ICD-10-CM | POA: Diagnosis not present

## 2022-06-20 DIAGNOSIS — I130 Hypertensive heart and chronic kidney disease with heart failure and stage 1 through stage 4 chronic kidney disease, or unspecified chronic kidney disease: Secondary | ICD-10-CM | POA: Diagnosis not present

## 2022-06-20 DIAGNOSIS — Z950 Presence of cardiac pacemaker: Secondary | ICD-10-CM | POA: Diagnosis not present

## 2022-06-20 DIAGNOSIS — I509 Heart failure, unspecified: Secondary | ICD-10-CM | POA: Diagnosis not present

## 2022-06-20 DIAGNOSIS — M7989 Other specified soft tissue disorders: Secondary | ICD-10-CM | POA: Diagnosis not present

## 2022-06-20 DIAGNOSIS — I4819 Other persistent atrial fibrillation: Secondary | ICD-10-CM | POA: Diagnosis not present

## 2022-06-20 DIAGNOSIS — Z89421 Acquired absence of other right toe(s): Secondary | ICD-10-CM | POA: Diagnosis not present

## 2022-06-20 DIAGNOSIS — J9601 Acute respiratory failure with hypoxia: Secondary | ICD-10-CM | POA: Diagnosis not present

## 2022-06-20 DIAGNOSIS — E039 Hypothyroidism, unspecified: Secondary | ICD-10-CM | POA: Diagnosis not present

## 2022-06-20 DIAGNOSIS — R338 Other retention of urine: Secondary | ICD-10-CM | POA: Diagnosis not present

## 2022-06-20 DIAGNOSIS — Z8572 Personal history of non-Hodgkin lymphomas: Secondary | ICD-10-CM | POA: Diagnosis not present

## 2022-06-20 DIAGNOSIS — Z466 Encounter for fitting and adjustment of urinary device: Secondary | ICD-10-CM | POA: Diagnosis not present

## 2022-06-20 DIAGNOSIS — Z87891 Personal history of nicotine dependence: Secondary | ICD-10-CM | POA: Diagnosis not present

## 2022-06-20 DIAGNOSIS — I872 Venous insufficiency (chronic) (peripheral): Secondary | ICD-10-CM | POA: Diagnosis not present

## 2022-06-20 DIAGNOSIS — L97512 Non-pressure chronic ulcer of other part of right foot with fat layer exposed: Secondary | ICD-10-CM | POA: Diagnosis not present

## 2022-06-20 DIAGNOSIS — N184 Chronic kidney disease, stage 4 (severe): Secondary | ICD-10-CM | POA: Diagnosis not present

## 2022-06-20 DIAGNOSIS — G9341 Metabolic encephalopathy: Secondary | ICD-10-CM | POA: Diagnosis not present

## 2022-06-20 DIAGNOSIS — N179 Acute kidney failure, unspecified: Secondary | ICD-10-CM | POA: Diagnosis not present

## 2022-06-20 DIAGNOSIS — I442 Atrioventricular block, complete: Secondary | ICD-10-CM | POA: Diagnosis not present

## 2022-06-20 DIAGNOSIS — M86671 Other chronic osteomyelitis, right ankle and foot: Secondary | ICD-10-CM | POA: Diagnosis not present

## 2022-06-20 DIAGNOSIS — K219 Gastro-esophageal reflux disease without esophagitis: Secondary | ICD-10-CM | POA: Diagnosis not present

## 2022-06-20 NOTE — Telephone Encounter (Signed)
Requested medication (s) are due for refill today: no  Requested medication (s) are on the active medication list: yes  Last refill:  05/26/22  Future visit scheduled: yes  Notes to clinic:  Unable to refill per protocol, last refill by provider 05/26/22 for 30 days and 5 refills. Pt request 90 day supply, routing for approval.     Requested Prescriptions  Pending Prescriptions Disp Refills   QUEtiapine (SEROQUEL) 25 MG tablet [Pharmacy Med Name: QUETIAPINE FUMARATE 25 MG TAB] 90 tablet 2    Sig: TAKE 1 TABLET BY MOUTH EVERYDAY AT BEDTIME     Not Delegated - Psychiatry:  Antipsychotics - Second Generation (Atypical) - quetiapine Failed - 06/18/2022 12:35 PM      Failed - This refill cannot be delegated      Failed - Lipid Panel in normal range within the last 12 months    No results found for: "CHOL", "POCCHOL", "CHOLTOT" No results found for: "[place]", "LDLC", "HIRISKLDL", "POCLDL", "LDLDIRECT", "REALLDLC", "TOTLDLC" No results found for: "HDL", "POCHDL" No results found for: "TRIG", "POCTRIG"       Passed - TSH in normal range and within 360 days    TSH  Date Value Ref Range Status  06/25/2021 0.42 0.40 - 4.50 mIU/L Final         Passed - Last BP in normal range    BP Readings from Last 1 Encounters:  05/26/22 122/78         Passed - Last Heart Rate in normal range    Pulse Readings from Last 1 Encounters:  05/26/22 64         Passed - Valid encounter within last 6 months    Recent Outpatient Visits           3 months ago Ischemic ulcer, limited to breakdown of skin ([place])   [place] Dennard Schaumann, Cammie Mcgee, MD   5 months ago Wound of right lower extremity, subsequent encounter   [place] Dennard Schaumann, Cammie Mcgee, MD   5 months ago Wound of right lower extremity, subsequent encounter   [place] Susy Frizzle, MD   5 months ago Stage 3b chronic kidney disease ([place])   [place] Pickard, Cammie Mcgee,  MD   5 months ago Wound of right lower extremity, initial encounter   [place] Pickard, Cammie Mcgee, MD              Passed - CBC within normal limits and completed in the last 12 months    WBC  Date Value Ref Range Status  05/26/2022 6.4 3.8 - 10.8 Thousand/uL Final   RBC  Date Value Ref Range Status  05/26/2022 4.33 4.20 - 5.80 Million/uL Final   Hemoglobin  Date Value Ref Range Status  05/26/2022 13.1 (L) 13.2 - 17.1 g/dL Final   HGB  Date Value Ref Range Status  04/08/2014 13.7 13.0 - 17.1 g/dL Final   HCT  Date Value Ref Range Status  05/26/2022 39.5 38.5 - 50.0 % Final  04/08/2014 40.7 38.4 - 49.9 % Final   MCHC  Date Value Ref Range Status  05/26/2022 33.2 32.0 - 36.0 g/dL Final   [facility]  Date Value Ref Range Status  05/26/2022 30.3 27.0 - 33.0 pg Final   MCV  Date Value Ref Range Status  05/26/2022 91.2 80.0 - 100.0 fL Final  04/08/2014 92.8 79.3 - 98.0 fL Final   No results found for: "[place]", "  LABPLAT", "POCPLA" RDW  Date Value Ref Range Status  05/26/2022 15.3 (H) 11.0 - 15.0 % Final  04/08/2014 14.0 11.0 - 14.6 % Final         Passed - CMP within normal limits and completed in the last 12 months    Albumin  Date Value Ref Range Status  04/13/2022 3.8 3.5 - 5.0 g/dL Final  04/08/2014 3.9 3.5 - 5.0 g/dL Final   Alkaline Phosphatase  Date Value Ref Range Status  04/13/2022 57 38 - 126 U/L Final  04/08/2014 64 40 - 150 U/L Final   Alkaline phosphatase (APISO)  Date Value Ref Range Status  05/26/2022 61 35 - 144 U/L Final   ALT  Date Value Ref Range Status  05/26/2022 14 9 - 46 U/L Final  04/08/2014 13 0 - 55 U/L Final   AST  Date Value Ref Range Status  05/26/2022 18 10 - 35 U/L Final  04/08/2014 19 5 - 34 U/L Final   BUN  Date Value Ref Range Status  05/26/2022 19 7 - 25 mg/dL Final  04/08/2014 17.5 7.0 - 26.0 mg/dL Final   Calcium  Date Value Ref Range Status  05/26/2022 9.0 8.6 - 10.3 mg/dL Final   04/08/2014 9.6 8.4 - 10.4 mg/dL Final   Calcium, Ion  Date Value Ref Range Status  02/13/2012 1.23 1.12 - 1.32 mmol/L Final   CO2  Date Value Ref Range Status  05/26/2022 26 20 - 32 mmol/L Final  04/08/2014 26 22 - 29 mEq/L Final   TCO2  Date Value Ref Range Status  02/13/2012 27 0 - 100 mmol/L Final   Creatinine  Date Value Ref Range Status  04/08/2014 1.4 (H) 0.7 - 1.3 mg/dL Final   Creat  Date Value Ref Range Status  05/26/2022 1.23 (H) 0.70 - 1.22 mg/dL Final   Glucose  Date Value Ref Range Status  04/08/2014 91 70 - 140 mg/dl Final   Glucose, Bld  Date Value Ref Range Status  05/26/2022 85 65 - 99 mg/dL Final    Comment:    .            Fasting reference interval .    Potassium  Date Value Ref Range Status  05/26/2022 4.6 3.5 - 5.3 mmol/L Final  04/08/2014 4.2 3.5 - 5.1 mEq/L Final   Sodium  Date Value Ref Range Status  05/26/2022 143 135 - 146 mmol/L Final  04/08/2014 146 (H) 136 - 145 mEq/L Final   Total Bilirubin  Date Value Ref Range Status  05/26/2022 0.5 0.2 - 1.2 mg/dL Final  04/08/2014 0.66 0.20 - 1.20 mg/dL Final   Bilirubin, Direct  Date Value Ref Range Status  11/07/2018 2.0 (H) 0.0 - 0.2 mg/dL Final   Indirect Bilirubin  Date Value Ref Range Status  11/07/2018 1.4 (H) 0.3 - 0.9 mg/dL Final    Comment:    Performed at [facility], [place] [address] [place], [place] [address]   Protein, ur  Date Value Ref Range Status  04/14/2022 NEGATIVE NEGATIVE mg/dL Final   Total Protein  Date Value Ref Range Status  05/26/2022 6.9 6.1 - 8.1 g/dL Final  04/08/2014 6.7 6.4 - 8.3 g/dL Final   GFR, Est African American  Date Value Ref Range Status  05/18/2020 40 (L) > OR = 60 mL/min/1.21m Final   GFR calc Af Amer  Date Value Ref Range Status  05/27/2020 50 (L) >60 mL/min Final   eGFR  Date Value Ref Range  Status  05/26/2022 52 (L) > OR = 60 mL/min/1.55m Final    Comment:    The eGFR is based on the CKD-EPI 2021  equation. To calculate  the new eGFR from a previous Creatinine or Cystatin C result, go to https://www.kidney.org/professionals/ kdoqi/gfr%5Fcalculator    GFR, Est Non African American  Date Value Ref Range Status  05/18/2020 35 (L) > OR = 60 mL/min/1.768mFinal   GFR, Estimated  Date Value Ref Range Status  04/18/2022 39 (L) >60 mL/min Final    Comment:    (NOTE) Calculated using the CKD-EPI Creatinine Equation (2021)

## 2022-06-22 DIAGNOSIS — R338 Other retention of urine: Secondary | ICD-10-CM | POA: Diagnosis not present

## 2022-06-22 DIAGNOSIS — Z89421 Acquired absence of other right toe(s): Secondary | ICD-10-CM | POA: Diagnosis not present

## 2022-06-22 DIAGNOSIS — I4819 Other persistent atrial fibrillation: Secondary | ICD-10-CM | POA: Diagnosis not present

## 2022-06-22 DIAGNOSIS — Z8572 Personal history of non-Hodgkin lymphomas: Secondary | ICD-10-CM | POA: Diagnosis not present

## 2022-06-22 DIAGNOSIS — J9601 Acute respiratory failure with hypoxia: Secondary | ICD-10-CM | POA: Diagnosis not present

## 2022-06-22 DIAGNOSIS — N179 Acute kidney failure, unspecified: Secondary | ICD-10-CM | POA: Diagnosis not present

## 2022-06-22 DIAGNOSIS — E039 Hypothyroidism, unspecified: Secondary | ICD-10-CM | POA: Diagnosis not present

## 2022-06-22 DIAGNOSIS — G9341 Metabolic encephalopathy: Secondary | ICD-10-CM | POA: Diagnosis not present

## 2022-06-22 DIAGNOSIS — Z466 Encounter for fitting and adjustment of urinary device: Secondary | ICD-10-CM | POA: Diagnosis not present

## 2022-06-22 DIAGNOSIS — J690 Pneumonitis due to inhalation of food and vomit: Secondary | ICD-10-CM | POA: Diagnosis not present

## 2022-06-22 DIAGNOSIS — I442 Atrioventricular block, complete: Secondary | ICD-10-CM | POA: Diagnosis not present

## 2022-06-22 DIAGNOSIS — N184 Chronic kidney disease, stage 4 (severe): Secondary | ICD-10-CM | POA: Diagnosis not present

## 2022-06-22 DIAGNOSIS — I872 Venous insufficiency (chronic) (peripheral): Secondary | ICD-10-CM | POA: Diagnosis not present

## 2022-06-22 DIAGNOSIS — K219 Gastro-esophageal reflux disease without esophagitis: Secondary | ICD-10-CM | POA: Diagnosis not present

## 2022-06-22 DIAGNOSIS — Z87891 Personal history of nicotine dependence: Secondary | ICD-10-CM | POA: Diagnosis not present

## 2022-06-22 DIAGNOSIS — Z950 Presence of cardiac pacemaker: Secondary | ICD-10-CM | POA: Diagnosis not present

## 2022-06-22 DIAGNOSIS — I130 Hypertensive heart and chronic kidney disease with heart failure and stage 1 through stage 4 chronic kidney disease, or unspecified chronic kidney disease: Secondary | ICD-10-CM | POA: Diagnosis not present

## 2022-06-22 DIAGNOSIS — I509 Heart failure, unspecified: Secondary | ICD-10-CM | POA: Diagnosis not present

## 2022-06-22 DIAGNOSIS — L97512 Non-pressure chronic ulcer of other part of right foot with fat layer exposed: Secondary | ICD-10-CM | POA: Diagnosis not present

## 2022-06-22 DIAGNOSIS — I739 Peripheral vascular disease, unspecified: Secondary | ICD-10-CM | POA: Diagnosis not present

## 2022-06-22 DIAGNOSIS — M86671 Other chronic osteomyelitis, right ankle and foot: Secondary | ICD-10-CM | POA: Diagnosis not present

## 2022-06-24 ENCOUNTER — Telehealth: Payer: Self-pay

## 2022-06-24 DIAGNOSIS — I442 Atrioventricular block, complete: Secondary | ICD-10-CM | POA: Diagnosis not present

## 2022-06-24 DIAGNOSIS — E039 Hypothyroidism, unspecified: Secondary | ICD-10-CM | POA: Diagnosis not present

## 2022-06-24 DIAGNOSIS — I4819 Other persistent atrial fibrillation: Secondary | ICD-10-CM | POA: Diagnosis not present

## 2022-06-24 DIAGNOSIS — I130 Hypertensive heart and chronic kidney disease with heart failure and stage 1 through stage 4 chronic kidney disease, or unspecified chronic kidney disease: Secondary | ICD-10-CM | POA: Diagnosis not present

## 2022-06-24 DIAGNOSIS — I509 Heart failure, unspecified: Secondary | ICD-10-CM | POA: Diagnosis not present

## 2022-06-24 DIAGNOSIS — K219 Gastro-esophageal reflux disease without esophagitis: Secondary | ICD-10-CM | POA: Diagnosis not present

## 2022-06-24 DIAGNOSIS — Z950 Presence of cardiac pacemaker: Secondary | ICD-10-CM | POA: Diagnosis not present

## 2022-06-24 DIAGNOSIS — I739 Peripheral vascular disease, unspecified: Secondary | ICD-10-CM | POA: Diagnosis not present

## 2022-06-24 DIAGNOSIS — N184 Chronic kidney disease, stage 4 (severe): Secondary | ICD-10-CM | POA: Diagnosis not present

## 2022-06-24 DIAGNOSIS — L97512 Non-pressure chronic ulcer of other part of right foot with fat layer exposed: Secondary | ICD-10-CM | POA: Diagnosis not present

## 2022-06-24 DIAGNOSIS — G9341 Metabolic encephalopathy: Secondary | ICD-10-CM | POA: Diagnosis not present

## 2022-06-24 DIAGNOSIS — J690 Pneumonitis due to inhalation of food and vomit: Secondary | ICD-10-CM | POA: Diagnosis not present

## 2022-06-24 DIAGNOSIS — R338 Other retention of urine: Secondary | ICD-10-CM | POA: Diagnosis not present

## 2022-06-24 DIAGNOSIS — I872 Venous insufficiency (chronic) (peripheral): Secondary | ICD-10-CM | POA: Diagnosis not present

## 2022-06-24 DIAGNOSIS — Z89421 Acquired absence of other right toe(s): Secondary | ICD-10-CM | POA: Diagnosis not present

## 2022-06-24 DIAGNOSIS — J9601 Acute respiratory failure with hypoxia: Secondary | ICD-10-CM | POA: Diagnosis not present

## 2022-06-24 DIAGNOSIS — N179 Acute kidney failure, unspecified: Secondary | ICD-10-CM | POA: Diagnosis not present

## 2022-06-24 DIAGNOSIS — Z8572 Personal history of non-Hodgkin lymphomas: Secondary | ICD-10-CM | POA: Diagnosis not present

## 2022-06-24 DIAGNOSIS — Z87891 Personal history of nicotine dependence: Secondary | ICD-10-CM | POA: Diagnosis not present

## 2022-06-24 DIAGNOSIS — M86671 Other chronic osteomyelitis, right ankle and foot: Secondary | ICD-10-CM | POA: Diagnosis not present

## 2022-06-24 DIAGNOSIS — Z466 Encounter for fitting and adjustment of urinary device: Secondary | ICD-10-CM | POA: Diagnosis not present

## 2022-06-24 NOTE — Telephone Encounter (Signed)
[name]/[facility] called requesting verbal orders for   1x's '@2wk'$ , 1 '@3wk'$  and then to back 1x '@2wk'$   Per Doren Custard, Pt has ambulate w/rolator at 185f  Extended PT additional 2-3wks and will call later for recert.  'OFruita per Dr. PDennard Schaumannfor verbal  orders as long as pt needs service

## 2022-06-28 DIAGNOSIS — I872 Venous insufficiency (chronic) (peripheral): Secondary | ICD-10-CM | POA: Diagnosis not present

## 2022-06-28 DIAGNOSIS — I509 Heart failure, unspecified: Secondary | ICD-10-CM | POA: Diagnosis not present

## 2022-06-28 DIAGNOSIS — E039 Hypothyroidism, unspecified: Secondary | ICD-10-CM | POA: Diagnosis not present

## 2022-06-28 DIAGNOSIS — I739 Peripheral vascular disease, unspecified: Secondary | ICD-10-CM | POA: Diagnosis not present

## 2022-06-28 DIAGNOSIS — L97512 Non-pressure chronic ulcer of other part of right foot with fat layer exposed: Secondary | ICD-10-CM | POA: Diagnosis not present

## 2022-06-28 DIAGNOSIS — R338 Other retention of urine: Secondary | ICD-10-CM | POA: Diagnosis not present

## 2022-06-28 DIAGNOSIS — I442 Atrioventricular block, complete: Secondary | ICD-10-CM | POA: Diagnosis not present

## 2022-06-28 DIAGNOSIS — N179 Acute kidney failure, unspecified: Secondary | ICD-10-CM | POA: Diagnosis not present

## 2022-06-28 DIAGNOSIS — Z8572 Personal history of non-Hodgkin lymphomas: Secondary | ICD-10-CM | POA: Diagnosis not present

## 2022-06-28 DIAGNOSIS — Z950 Presence of cardiac pacemaker: Secondary | ICD-10-CM | POA: Diagnosis not present

## 2022-06-28 DIAGNOSIS — G9341 Metabolic encephalopathy: Secondary | ICD-10-CM | POA: Diagnosis not present

## 2022-06-28 DIAGNOSIS — Z466 Encounter for fitting and adjustment of urinary device: Secondary | ICD-10-CM | POA: Diagnosis not present

## 2022-06-28 DIAGNOSIS — Z87891 Personal history of nicotine dependence: Secondary | ICD-10-CM | POA: Diagnosis not present

## 2022-06-28 DIAGNOSIS — I4819 Other persistent atrial fibrillation: Secondary | ICD-10-CM | POA: Diagnosis not present

## 2022-06-28 DIAGNOSIS — I130 Hypertensive heart and chronic kidney disease with heart failure and stage 1 through stage 4 chronic kidney disease, or unspecified chronic kidney disease: Secondary | ICD-10-CM | POA: Diagnosis not present

## 2022-06-28 DIAGNOSIS — K219 Gastro-esophageal reflux disease without esophagitis: Secondary | ICD-10-CM | POA: Diagnosis not present

## 2022-06-28 DIAGNOSIS — J690 Pneumonitis due to inhalation of food and vomit: Secondary | ICD-10-CM | POA: Diagnosis not present

## 2022-06-28 DIAGNOSIS — Z89421 Acquired absence of other right toe(s): Secondary | ICD-10-CM | POA: Diagnosis not present

## 2022-06-28 DIAGNOSIS — M86671 Other chronic osteomyelitis, right ankle and foot: Secondary | ICD-10-CM | POA: Diagnosis not present

## 2022-06-28 DIAGNOSIS — J9601 Acute respiratory failure with hypoxia: Secondary | ICD-10-CM | POA: Diagnosis not present

## 2022-06-28 DIAGNOSIS — N184 Chronic kidney disease, stage 4 (severe): Secondary | ICD-10-CM | POA: Diagnosis not present

## 2022-07-01 DIAGNOSIS — K219 Gastro-esophageal reflux disease without esophagitis: Secondary | ICD-10-CM | POA: Diagnosis not present

## 2022-07-01 DIAGNOSIS — Z950 Presence of cardiac pacemaker: Secondary | ICD-10-CM | POA: Diagnosis not present

## 2022-07-01 DIAGNOSIS — I872 Venous insufficiency (chronic) (peripheral): Secondary | ICD-10-CM | POA: Diagnosis not present

## 2022-07-01 DIAGNOSIS — N184 Chronic kidney disease, stage 4 (severe): Secondary | ICD-10-CM | POA: Diagnosis not present

## 2022-07-01 DIAGNOSIS — I130 Hypertensive heart and chronic kidney disease with heart failure and stage 1 through stage 4 chronic kidney disease, or unspecified chronic kidney disease: Secondary | ICD-10-CM | POA: Diagnosis not present

## 2022-07-01 DIAGNOSIS — N179 Acute kidney failure, unspecified: Secondary | ICD-10-CM | POA: Diagnosis not present

## 2022-07-01 DIAGNOSIS — I739 Peripheral vascular disease, unspecified: Secondary | ICD-10-CM | POA: Diagnosis not present

## 2022-07-01 DIAGNOSIS — M86671 Other chronic osteomyelitis, right ankle and foot: Secondary | ICD-10-CM | POA: Diagnosis not present

## 2022-07-01 DIAGNOSIS — Z8572 Personal history of non-Hodgkin lymphomas: Secondary | ICD-10-CM | POA: Diagnosis not present

## 2022-07-01 DIAGNOSIS — E039 Hypothyroidism, unspecified: Secondary | ICD-10-CM | POA: Diagnosis not present

## 2022-07-01 DIAGNOSIS — I442 Atrioventricular block, complete: Secondary | ICD-10-CM | POA: Diagnosis not present

## 2022-07-01 DIAGNOSIS — Z89421 Acquired absence of other right toe(s): Secondary | ICD-10-CM | POA: Diagnosis not present

## 2022-07-01 DIAGNOSIS — I509 Heart failure, unspecified: Secondary | ICD-10-CM | POA: Diagnosis not present

## 2022-07-01 DIAGNOSIS — R338 Other retention of urine: Secondary | ICD-10-CM | POA: Diagnosis not present

## 2022-07-01 DIAGNOSIS — J9601 Acute respiratory failure with hypoxia: Secondary | ICD-10-CM | POA: Diagnosis not present

## 2022-07-01 DIAGNOSIS — G9341 Metabolic encephalopathy: Secondary | ICD-10-CM | POA: Diagnosis not present

## 2022-07-01 DIAGNOSIS — J690 Pneumonitis due to inhalation of food and vomit: Secondary | ICD-10-CM | POA: Diagnosis not present

## 2022-07-01 DIAGNOSIS — Z87891 Personal history of nicotine dependence: Secondary | ICD-10-CM | POA: Diagnosis not present

## 2022-07-01 DIAGNOSIS — Z466 Encounter for fitting and adjustment of urinary device: Secondary | ICD-10-CM | POA: Diagnosis not present

## 2022-07-01 DIAGNOSIS — I4819 Other persistent atrial fibrillation: Secondary | ICD-10-CM | POA: Diagnosis not present

## 2022-07-01 DIAGNOSIS — L97512 Non-pressure chronic ulcer of other part of right foot with fat layer exposed: Secondary | ICD-10-CM | POA: Diagnosis not present

## 2022-07-02 DIAGNOSIS — G9341 Metabolic encephalopathy: Secondary | ICD-10-CM | POA: Diagnosis not present

## 2022-07-02 DIAGNOSIS — I4819 Other persistent atrial fibrillation: Secondary | ICD-10-CM | POA: Diagnosis not present

## 2022-07-02 DIAGNOSIS — J9601 Acute respiratory failure with hypoxia: Secondary | ICD-10-CM | POA: Diagnosis not present

## 2022-07-02 DIAGNOSIS — K219 Gastro-esophageal reflux disease without esophagitis: Secondary | ICD-10-CM | POA: Diagnosis not present

## 2022-07-02 DIAGNOSIS — M86671 Other chronic osteomyelitis, right ankle and foot: Secondary | ICD-10-CM | POA: Diagnosis not present

## 2022-07-02 DIAGNOSIS — N179 Acute kidney failure, unspecified: Secondary | ICD-10-CM | POA: Diagnosis not present

## 2022-07-02 DIAGNOSIS — Z87891 Personal history of nicotine dependence: Secondary | ICD-10-CM | POA: Diagnosis not present

## 2022-07-02 DIAGNOSIS — I739 Peripheral vascular disease, unspecified: Secondary | ICD-10-CM | POA: Diagnosis not present

## 2022-07-02 DIAGNOSIS — J690 Pneumonitis due to inhalation of food and vomit: Secondary | ICD-10-CM | POA: Diagnosis not present

## 2022-07-02 DIAGNOSIS — L97512 Non-pressure chronic ulcer of other part of right foot with fat layer exposed: Secondary | ICD-10-CM | POA: Diagnosis not present

## 2022-07-02 DIAGNOSIS — Z8572 Personal history of non-Hodgkin lymphomas: Secondary | ICD-10-CM | POA: Diagnosis not present

## 2022-07-02 DIAGNOSIS — Z89421 Acquired absence of other right toe(s): Secondary | ICD-10-CM | POA: Diagnosis not present

## 2022-07-02 DIAGNOSIS — I872 Venous insufficiency (chronic) (peripheral): Secondary | ICD-10-CM | POA: Diagnosis not present

## 2022-07-02 DIAGNOSIS — I130 Hypertensive heart and chronic kidney disease with heart failure and stage 1 through stage 4 chronic kidney disease, or unspecified chronic kidney disease: Secondary | ICD-10-CM | POA: Diagnosis not present

## 2022-07-02 DIAGNOSIS — N184 Chronic kidney disease, stage 4 (severe): Secondary | ICD-10-CM | POA: Diagnosis not present

## 2022-07-02 DIAGNOSIS — I442 Atrioventricular block, complete: Secondary | ICD-10-CM | POA: Diagnosis not present

## 2022-07-02 DIAGNOSIS — E039 Hypothyroidism, unspecified: Secondary | ICD-10-CM | POA: Diagnosis not present

## 2022-07-02 DIAGNOSIS — R338 Other retention of urine: Secondary | ICD-10-CM | POA: Diagnosis not present

## 2022-07-02 DIAGNOSIS — Z466 Encounter for fitting and adjustment of urinary device: Secondary | ICD-10-CM | POA: Diagnosis not present

## 2022-07-02 DIAGNOSIS — I509 Heart failure, unspecified: Secondary | ICD-10-CM | POA: Diagnosis not present

## 2022-07-02 DIAGNOSIS — Z950 Presence of cardiac pacemaker: Secondary | ICD-10-CM | POA: Diagnosis not present

## 2022-07-04 DIAGNOSIS — Z8572 Personal history of non-Hodgkin lymphomas: Secondary | ICD-10-CM | POA: Diagnosis not present

## 2022-07-04 DIAGNOSIS — I872 Venous insufficiency (chronic) (peripheral): Secondary | ICD-10-CM | POA: Diagnosis not present

## 2022-07-04 DIAGNOSIS — L97512 Non-pressure chronic ulcer of other part of right foot with fat layer exposed: Secondary | ICD-10-CM | POA: Diagnosis not present

## 2022-07-04 DIAGNOSIS — I130 Hypertensive heart and chronic kidney disease with heart failure and stage 1 through stage 4 chronic kidney disease, or unspecified chronic kidney disease: Secondary | ICD-10-CM | POA: Diagnosis not present

## 2022-07-04 DIAGNOSIS — I4819 Other persistent atrial fibrillation: Secondary | ICD-10-CM | POA: Diagnosis not present

## 2022-07-04 DIAGNOSIS — J9601 Acute respiratory failure with hypoxia: Secondary | ICD-10-CM | POA: Diagnosis not present

## 2022-07-04 DIAGNOSIS — R338 Other retention of urine: Secondary | ICD-10-CM | POA: Diagnosis not present

## 2022-07-04 DIAGNOSIS — N179 Acute kidney failure, unspecified: Secondary | ICD-10-CM | POA: Diagnosis not present

## 2022-07-04 DIAGNOSIS — I509 Heart failure, unspecified: Secondary | ICD-10-CM | POA: Diagnosis not present

## 2022-07-04 DIAGNOSIS — N184 Chronic kidney disease, stage 4 (severe): Secondary | ICD-10-CM | POA: Diagnosis not present

## 2022-07-04 DIAGNOSIS — G9341 Metabolic encephalopathy: Secondary | ICD-10-CM | POA: Diagnosis not present

## 2022-07-04 DIAGNOSIS — J690 Pneumonitis due to inhalation of food and vomit: Secondary | ICD-10-CM | POA: Diagnosis not present

## 2022-07-04 DIAGNOSIS — M86671 Other chronic osteomyelitis, right ankle and foot: Secondary | ICD-10-CM | POA: Diagnosis not present

## 2022-07-04 DIAGNOSIS — I442 Atrioventricular block, complete: Secondary | ICD-10-CM | POA: Diagnosis not present

## 2022-07-04 DIAGNOSIS — K219 Gastro-esophageal reflux disease without esophagitis: Secondary | ICD-10-CM | POA: Diagnosis not present

## 2022-07-04 DIAGNOSIS — I739 Peripheral vascular disease, unspecified: Secondary | ICD-10-CM | POA: Diagnosis not present

## 2022-07-04 DIAGNOSIS — E039 Hypothyroidism, unspecified: Secondary | ICD-10-CM | POA: Diagnosis not present

## 2022-07-04 DIAGNOSIS — Z466 Encounter for fitting and adjustment of urinary device: Secondary | ICD-10-CM | POA: Diagnosis not present

## 2022-07-04 DIAGNOSIS — Z89421 Acquired absence of other right toe(s): Secondary | ICD-10-CM | POA: Diagnosis not present

## 2022-07-04 DIAGNOSIS — Z950 Presence of cardiac pacemaker: Secondary | ICD-10-CM | POA: Diagnosis not present

## 2022-07-04 DIAGNOSIS — Z87891 Personal history of nicotine dependence: Secondary | ICD-10-CM | POA: Diagnosis not present

## 2022-07-05 DIAGNOSIS — Z87891 Personal history of nicotine dependence: Secondary | ICD-10-CM | POA: Diagnosis not present

## 2022-07-05 DIAGNOSIS — J690 Pneumonitis due to inhalation of food and vomit: Secondary | ICD-10-CM | POA: Diagnosis not present

## 2022-07-05 DIAGNOSIS — Z8572 Personal history of non-Hodgkin lymphomas: Secondary | ICD-10-CM | POA: Diagnosis not present

## 2022-07-05 DIAGNOSIS — I442 Atrioventricular block, complete: Secondary | ICD-10-CM | POA: Diagnosis not present

## 2022-07-05 DIAGNOSIS — K219 Gastro-esophageal reflux disease without esophagitis: Secondary | ICD-10-CM | POA: Diagnosis not present

## 2022-07-05 DIAGNOSIS — N179 Acute kidney failure, unspecified: Secondary | ICD-10-CM | POA: Diagnosis not present

## 2022-07-05 DIAGNOSIS — M86671 Other chronic osteomyelitis, right ankle and foot: Secondary | ICD-10-CM | POA: Diagnosis not present

## 2022-07-05 DIAGNOSIS — Z950 Presence of cardiac pacemaker: Secondary | ICD-10-CM | POA: Diagnosis not present

## 2022-07-05 DIAGNOSIS — E039 Hypothyroidism, unspecified: Secondary | ICD-10-CM | POA: Diagnosis not present

## 2022-07-05 DIAGNOSIS — L97512 Non-pressure chronic ulcer of other part of right foot with fat layer exposed: Secondary | ICD-10-CM | POA: Diagnosis not present

## 2022-07-05 DIAGNOSIS — I739 Peripheral vascular disease, unspecified: Secondary | ICD-10-CM | POA: Diagnosis not present

## 2022-07-05 DIAGNOSIS — N184 Chronic kidney disease, stage 4 (severe): Secondary | ICD-10-CM | POA: Diagnosis not present

## 2022-07-05 DIAGNOSIS — G9341 Metabolic encephalopathy: Secondary | ICD-10-CM | POA: Diagnosis not present

## 2022-07-05 DIAGNOSIS — I509 Heart failure, unspecified: Secondary | ICD-10-CM | POA: Diagnosis not present

## 2022-07-05 DIAGNOSIS — J9601 Acute respiratory failure with hypoxia: Secondary | ICD-10-CM | POA: Diagnosis not present

## 2022-07-05 DIAGNOSIS — Z466 Encounter for fitting and adjustment of urinary device: Secondary | ICD-10-CM | POA: Diagnosis not present

## 2022-07-05 DIAGNOSIS — R338 Other retention of urine: Secondary | ICD-10-CM | POA: Diagnosis not present

## 2022-07-05 DIAGNOSIS — I872 Venous insufficiency (chronic) (peripheral): Secondary | ICD-10-CM | POA: Diagnosis not present

## 2022-07-05 DIAGNOSIS — Z89421 Acquired absence of other right toe(s): Secondary | ICD-10-CM | POA: Diagnosis not present

## 2022-07-05 DIAGNOSIS — I4819 Other persistent atrial fibrillation: Secondary | ICD-10-CM | POA: Diagnosis not present

## 2022-07-05 DIAGNOSIS — I130 Hypertensive heart and chronic kidney disease with heart failure and stage 1 through stage 4 chronic kidney disease, or unspecified chronic kidney disease: Secondary | ICD-10-CM | POA: Diagnosis not present

## 2022-07-06 DIAGNOSIS — I130 Hypertensive heart and chronic kidney disease with heart failure and stage 1 through stage 4 chronic kidney disease, or unspecified chronic kidney disease: Secondary | ICD-10-CM | POA: Diagnosis not present

## 2022-07-06 DIAGNOSIS — Z8572 Personal history of non-Hodgkin lymphomas: Secondary | ICD-10-CM | POA: Diagnosis not present

## 2022-07-06 DIAGNOSIS — N184 Chronic kidney disease, stage 4 (severe): Secondary | ICD-10-CM | POA: Diagnosis not present

## 2022-07-06 DIAGNOSIS — G9341 Metabolic encephalopathy: Secondary | ICD-10-CM | POA: Diagnosis not present

## 2022-07-06 DIAGNOSIS — Z466 Encounter for fitting and adjustment of urinary device: Secondary | ICD-10-CM | POA: Diagnosis not present

## 2022-07-06 DIAGNOSIS — Z950 Presence of cardiac pacemaker: Secondary | ICD-10-CM | POA: Diagnosis not present

## 2022-07-06 DIAGNOSIS — I442 Atrioventricular block, complete: Secondary | ICD-10-CM | POA: Diagnosis not present

## 2022-07-06 DIAGNOSIS — K219 Gastro-esophageal reflux disease without esophagitis: Secondary | ICD-10-CM | POA: Diagnosis not present

## 2022-07-06 DIAGNOSIS — R338 Other retention of urine: Secondary | ICD-10-CM | POA: Diagnosis not present

## 2022-07-06 DIAGNOSIS — I509 Heart failure, unspecified: Secondary | ICD-10-CM | POA: Diagnosis not present

## 2022-07-06 DIAGNOSIS — J9601 Acute respiratory failure with hypoxia: Secondary | ICD-10-CM | POA: Diagnosis not present

## 2022-07-06 DIAGNOSIS — I872 Venous insufficiency (chronic) (peripheral): Secondary | ICD-10-CM | POA: Diagnosis not present

## 2022-07-06 DIAGNOSIS — Z89421 Acquired absence of other right toe(s): Secondary | ICD-10-CM | POA: Diagnosis not present

## 2022-07-06 DIAGNOSIS — L97512 Non-pressure chronic ulcer of other part of right foot with fat layer exposed: Secondary | ICD-10-CM | POA: Diagnosis not present

## 2022-07-06 DIAGNOSIS — I739 Peripheral vascular disease, unspecified: Secondary | ICD-10-CM | POA: Diagnosis not present

## 2022-07-06 DIAGNOSIS — J690 Pneumonitis due to inhalation of food and vomit: Secondary | ICD-10-CM | POA: Diagnosis not present

## 2022-07-06 DIAGNOSIS — Z87891 Personal history of nicotine dependence: Secondary | ICD-10-CM | POA: Diagnosis not present

## 2022-07-06 DIAGNOSIS — I4819 Other persistent atrial fibrillation: Secondary | ICD-10-CM | POA: Diagnosis not present

## 2022-07-06 DIAGNOSIS — E039 Hypothyroidism, unspecified: Secondary | ICD-10-CM | POA: Diagnosis not present

## 2022-07-06 DIAGNOSIS — N179 Acute kidney failure, unspecified: Secondary | ICD-10-CM | POA: Diagnosis not present

## 2022-07-06 DIAGNOSIS — M86671 Other chronic osteomyelitis, right ankle and foot: Secondary | ICD-10-CM | POA: Diagnosis not present

## 2022-07-11 DIAGNOSIS — Z87891 Personal history of nicotine dependence: Secondary | ICD-10-CM | POA: Diagnosis not present

## 2022-07-11 DIAGNOSIS — I4819 Other persistent atrial fibrillation: Secondary | ICD-10-CM | POA: Diagnosis not present

## 2022-07-11 DIAGNOSIS — L97512 Non-pressure chronic ulcer of other part of right foot with fat layer exposed: Secondary | ICD-10-CM | POA: Diagnosis not present

## 2022-07-11 DIAGNOSIS — I442 Atrioventricular block, complete: Secondary | ICD-10-CM | POA: Diagnosis not present

## 2022-07-11 DIAGNOSIS — I130 Hypertensive heart and chronic kidney disease with heart failure and stage 1 through stage 4 chronic kidney disease, or unspecified chronic kidney disease: Secondary | ICD-10-CM | POA: Diagnosis not present

## 2022-07-11 DIAGNOSIS — G9341 Metabolic encephalopathy: Secondary | ICD-10-CM | POA: Diagnosis not present

## 2022-07-11 DIAGNOSIS — I872 Venous insufficiency (chronic) (peripheral): Secondary | ICD-10-CM | POA: Diagnosis not present

## 2022-07-11 DIAGNOSIS — Z950 Presence of cardiac pacemaker: Secondary | ICD-10-CM | POA: Diagnosis not present

## 2022-07-11 DIAGNOSIS — E039 Hypothyroidism, unspecified: Secondary | ICD-10-CM | POA: Diagnosis not present

## 2022-07-11 DIAGNOSIS — R338 Other retention of urine: Secondary | ICD-10-CM | POA: Diagnosis not present

## 2022-07-11 DIAGNOSIS — I509 Heart failure, unspecified: Secondary | ICD-10-CM | POA: Diagnosis not present

## 2022-07-11 DIAGNOSIS — N184 Chronic kidney disease, stage 4 (severe): Secondary | ICD-10-CM | POA: Diagnosis not present

## 2022-07-11 DIAGNOSIS — Z466 Encounter for fitting and adjustment of urinary device: Secondary | ICD-10-CM | POA: Diagnosis not present

## 2022-07-11 DIAGNOSIS — Z89421 Acquired absence of other right toe(s): Secondary | ICD-10-CM | POA: Diagnosis not present

## 2022-07-11 DIAGNOSIS — M86671 Other chronic osteomyelitis, right ankle and foot: Secondary | ICD-10-CM | POA: Diagnosis not present

## 2022-07-11 DIAGNOSIS — J9601 Acute respiratory failure with hypoxia: Secondary | ICD-10-CM | POA: Diagnosis not present

## 2022-07-11 DIAGNOSIS — Z8572 Personal history of non-Hodgkin lymphomas: Secondary | ICD-10-CM | POA: Diagnosis not present

## 2022-07-11 DIAGNOSIS — J690 Pneumonitis due to inhalation of food and vomit: Secondary | ICD-10-CM | POA: Diagnosis not present

## 2022-07-11 DIAGNOSIS — N179 Acute kidney failure, unspecified: Secondary | ICD-10-CM | POA: Diagnosis not present

## 2022-07-11 DIAGNOSIS — K219 Gastro-esophageal reflux disease without esophagitis: Secondary | ICD-10-CM | POA: Diagnosis not present

## 2022-07-11 DIAGNOSIS — I739 Peripheral vascular disease, unspecified: Secondary | ICD-10-CM | POA: Diagnosis not present

## 2022-07-13 DIAGNOSIS — Z466 Encounter for fitting and adjustment of urinary device: Secondary | ICD-10-CM | POA: Diagnosis not present

## 2022-07-13 DIAGNOSIS — I130 Hypertensive heart and chronic kidney disease with heart failure and stage 1 through stage 4 chronic kidney disease, or unspecified chronic kidney disease: Secondary | ICD-10-CM | POA: Diagnosis not present

## 2022-07-13 DIAGNOSIS — I442 Atrioventricular block, complete: Secondary | ICD-10-CM | POA: Diagnosis not present

## 2022-07-13 DIAGNOSIS — N179 Acute kidney failure, unspecified: Secondary | ICD-10-CM | POA: Diagnosis not present

## 2022-07-13 DIAGNOSIS — K219 Gastro-esophageal reflux disease without esophagitis: Secondary | ICD-10-CM | POA: Diagnosis not present

## 2022-07-13 DIAGNOSIS — R338 Other retention of urine: Secondary | ICD-10-CM | POA: Diagnosis not present

## 2022-07-13 DIAGNOSIS — N184 Chronic kidney disease, stage 4 (severe): Secondary | ICD-10-CM | POA: Diagnosis not present

## 2022-07-13 DIAGNOSIS — Z8572 Personal history of non-Hodgkin lymphomas: Secondary | ICD-10-CM | POA: Diagnosis not present

## 2022-07-13 DIAGNOSIS — Z87891 Personal history of nicotine dependence: Secondary | ICD-10-CM | POA: Diagnosis not present

## 2022-07-13 DIAGNOSIS — I4819 Other persistent atrial fibrillation: Secondary | ICD-10-CM | POA: Diagnosis not present

## 2022-07-13 DIAGNOSIS — J690 Pneumonitis due to inhalation of food and vomit: Secondary | ICD-10-CM | POA: Diagnosis not present

## 2022-07-13 DIAGNOSIS — E039 Hypothyroidism, unspecified: Secondary | ICD-10-CM | POA: Diagnosis not present

## 2022-07-13 DIAGNOSIS — I872 Venous insufficiency (chronic) (peripheral): Secondary | ICD-10-CM | POA: Diagnosis not present

## 2022-07-13 DIAGNOSIS — J9601 Acute respiratory failure with hypoxia: Secondary | ICD-10-CM | POA: Diagnosis not present

## 2022-07-13 DIAGNOSIS — L97512 Non-pressure chronic ulcer of other part of right foot with fat layer exposed: Secondary | ICD-10-CM | POA: Diagnosis not present

## 2022-07-13 DIAGNOSIS — I739 Peripheral vascular disease, unspecified: Secondary | ICD-10-CM | POA: Diagnosis not present

## 2022-07-13 DIAGNOSIS — I509 Heart failure, unspecified: Secondary | ICD-10-CM | POA: Diagnosis not present

## 2022-07-13 DIAGNOSIS — Z89421 Acquired absence of other right toe(s): Secondary | ICD-10-CM | POA: Diagnosis not present

## 2022-07-13 DIAGNOSIS — G9341 Metabolic encephalopathy: Secondary | ICD-10-CM | POA: Diagnosis not present

## 2022-07-13 DIAGNOSIS — M86671 Other chronic osteomyelitis, right ankle and foot: Secondary | ICD-10-CM | POA: Diagnosis not present

## 2022-07-13 DIAGNOSIS — Z950 Presence of cardiac pacemaker: Secondary | ICD-10-CM | POA: Diagnosis not present

## 2022-07-14 DIAGNOSIS — K219 Gastro-esophageal reflux disease without esophagitis: Secondary | ICD-10-CM | POA: Diagnosis not present

## 2022-07-14 DIAGNOSIS — J690 Pneumonitis due to inhalation of food and vomit: Secondary | ICD-10-CM | POA: Diagnosis not present

## 2022-07-14 DIAGNOSIS — I4819 Other persistent atrial fibrillation: Secondary | ICD-10-CM | POA: Diagnosis not present

## 2022-07-14 DIAGNOSIS — I509 Heart failure, unspecified: Secondary | ICD-10-CM | POA: Diagnosis not present

## 2022-07-14 DIAGNOSIS — G9341 Metabolic encephalopathy: Secondary | ICD-10-CM | POA: Diagnosis not present

## 2022-07-14 DIAGNOSIS — M86671 Other chronic osteomyelitis, right ankle and foot: Secondary | ICD-10-CM | POA: Diagnosis not present

## 2022-07-14 DIAGNOSIS — J9601 Acute respiratory failure with hypoxia: Secondary | ICD-10-CM | POA: Diagnosis not present

## 2022-07-14 DIAGNOSIS — Z466 Encounter for fitting and adjustment of urinary device: Secondary | ICD-10-CM | POA: Diagnosis not present

## 2022-07-14 DIAGNOSIS — E039 Hypothyroidism, unspecified: Secondary | ICD-10-CM | POA: Diagnosis not present

## 2022-07-14 DIAGNOSIS — N184 Chronic kidney disease, stage 4 (severe): Secondary | ICD-10-CM | POA: Diagnosis not present

## 2022-07-14 DIAGNOSIS — I739 Peripheral vascular disease, unspecified: Secondary | ICD-10-CM | POA: Diagnosis not present

## 2022-07-14 DIAGNOSIS — I130 Hypertensive heart and chronic kidney disease with heart failure and stage 1 through stage 4 chronic kidney disease, or unspecified chronic kidney disease: Secondary | ICD-10-CM | POA: Diagnosis not present

## 2022-07-14 DIAGNOSIS — I442 Atrioventricular block, complete: Secondary | ICD-10-CM | POA: Diagnosis not present

## 2022-07-14 DIAGNOSIS — Z950 Presence of cardiac pacemaker: Secondary | ICD-10-CM | POA: Diagnosis not present

## 2022-07-14 DIAGNOSIS — L97512 Non-pressure chronic ulcer of other part of right foot with fat layer exposed: Secondary | ICD-10-CM | POA: Diagnosis not present

## 2022-07-14 DIAGNOSIS — R338 Other retention of urine: Secondary | ICD-10-CM | POA: Diagnosis not present

## 2022-07-14 DIAGNOSIS — I872 Venous insufficiency (chronic) (peripheral): Secondary | ICD-10-CM | POA: Diagnosis not present

## 2022-07-14 DIAGNOSIS — Z8572 Personal history of non-Hodgkin lymphomas: Secondary | ICD-10-CM | POA: Diagnosis not present

## 2022-07-14 DIAGNOSIS — Z87891 Personal history of nicotine dependence: Secondary | ICD-10-CM | POA: Diagnosis not present

## 2022-07-14 DIAGNOSIS — Z89421 Acquired absence of other right toe(s): Secondary | ICD-10-CM | POA: Diagnosis not present

## 2022-07-14 DIAGNOSIS — N179 Acute kidney failure, unspecified: Secondary | ICD-10-CM | POA: Diagnosis not present

## 2022-07-15 DIAGNOSIS — L97519 Non-pressure chronic ulcer of other part of right foot with unspecified severity: Secondary | ICD-10-CM | POA: Diagnosis not present

## 2022-07-15 DIAGNOSIS — L89893 Pressure ulcer of other site, stage 3: Secondary | ICD-10-CM | POA: Diagnosis not present

## 2022-07-20 DIAGNOSIS — E039 Hypothyroidism, unspecified: Secondary | ICD-10-CM | POA: Diagnosis not present

## 2022-07-20 DIAGNOSIS — Z89421 Acquired absence of other right toe(s): Secondary | ICD-10-CM | POA: Diagnosis not present

## 2022-07-20 DIAGNOSIS — I739 Peripheral vascular disease, unspecified: Secondary | ICD-10-CM | POA: Diagnosis not present

## 2022-07-20 DIAGNOSIS — Z87891 Personal history of nicotine dependence: Secondary | ICD-10-CM | POA: Diagnosis not present

## 2022-07-20 DIAGNOSIS — I4819 Other persistent atrial fibrillation: Secondary | ICD-10-CM | POA: Diagnosis not present

## 2022-07-20 DIAGNOSIS — K219 Gastro-esophageal reflux disease without esophagitis: Secondary | ICD-10-CM | POA: Diagnosis not present

## 2022-07-20 DIAGNOSIS — G9341 Metabolic encephalopathy: Secondary | ICD-10-CM | POA: Diagnosis not present

## 2022-07-20 DIAGNOSIS — I130 Hypertensive heart and chronic kidney disease with heart failure and stage 1 through stage 4 chronic kidney disease, or unspecified chronic kidney disease: Secondary | ICD-10-CM | POA: Diagnosis not present

## 2022-07-20 DIAGNOSIS — I509 Heart failure, unspecified: Secondary | ICD-10-CM | POA: Diagnosis not present

## 2022-07-20 DIAGNOSIS — I872 Venous insufficiency (chronic) (peripheral): Secondary | ICD-10-CM | POA: Diagnosis not present

## 2022-07-20 DIAGNOSIS — J9601 Acute respiratory failure with hypoxia: Secondary | ICD-10-CM | POA: Diagnosis not present

## 2022-07-20 DIAGNOSIS — R338 Other retention of urine: Secondary | ICD-10-CM | POA: Diagnosis not present

## 2022-07-20 DIAGNOSIS — N184 Chronic kidney disease, stage 4 (severe): Secondary | ICD-10-CM | POA: Diagnosis not present

## 2022-07-20 DIAGNOSIS — Z950 Presence of cardiac pacemaker: Secondary | ICD-10-CM | POA: Diagnosis not present

## 2022-07-20 DIAGNOSIS — Z466 Encounter for fitting and adjustment of urinary device: Secondary | ICD-10-CM | POA: Diagnosis not present

## 2022-07-20 DIAGNOSIS — Z8572 Personal history of non-Hodgkin lymphomas: Secondary | ICD-10-CM | POA: Diagnosis not present

## 2022-07-20 DIAGNOSIS — L97512 Non-pressure chronic ulcer of other part of right foot with fat layer exposed: Secondary | ICD-10-CM | POA: Diagnosis not present

## 2022-07-20 DIAGNOSIS — J690 Pneumonitis due to inhalation of food and vomit: Secondary | ICD-10-CM | POA: Diagnosis not present

## 2022-07-20 DIAGNOSIS — N179 Acute kidney failure, unspecified: Secondary | ICD-10-CM | POA: Diagnosis not present

## 2022-07-20 DIAGNOSIS — I442 Atrioventricular block, complete: Secondary | ICD-10-CM | POA: Diagnosis not present

## 2022-07-20 DIAGNOSIS — M86671 Other chronic osteomyelitis, right ankle and foot: Secondary | ICD-10-CM | POA: Diagnosis not present

## 2022-07-21 DIAGNOSIS — M7989 Other specified soft tissue disorders: Secondary | ICD-10-CM | POA: Diagnosis not present

## 2022-07-21 DIAGNOSIS — J9601 Acute respiratory failure with hypoxia: Secondary | ICD-10-CM | POA: Diagnosis not present

## 2022-07-21 DIAGNOSIS — I739 Peripheral vascular disease, unspecified: Secondary | ICD-10-CM | POA: Diagnosis not present

## 2022-07-22 DIAGNOSIS — I872 Venous insufficiency (chronic) (peripheral): Secondary | ICD-10-CM | POA: Diagnosis not present

## 2022-07-22 DIAGNOSIS — N179 Acute kidney failure, unspecified: Secondary | ICD-10-CM | POA: Diagnosis not present

## 2022-07-22 DIAGNOSIS — N184 Chronic kidney disease, stage 4 (severe): Secondary | ICD-10-CM | POA: Diagnosis not present

## 2022-07-22 DIAGNOSIS — Z466 Encounter for fitting and adjustment of urinary device: Secondary | ICD-10-CM | POA: Diagnosis not present

## 2022-07-22 DIAGNOSIS — J690 Pneumonitis due to inhalation of food and vomit: Secondary | ICD-10-CM | POA: Diagnosis not present

## 2022-07-22 DIAGNOSIS — E039 Hypothyroidism, unspecified: Secondary | ICD-10-CM | POA: Diagnosis not present

## 2022-07-22 DIAGNOSIS — I739 Peripheral vascular disease, unspecified: Secondary | ICD-10-CM | POA: Diagnosis not present

## 2022-07-22 DIAGNOSIS — Z87891 Personal history of nicotine dependence: Secondary | ICD-10-CM | POA: Diagnosis not present

## 2022-07-22 DIAGNOSIS — I509 Heart failure, unspecified: Secondary | ICD-10-CM | POA: Diagnosis not present

## 2022-07-22 DIAGNOSIS — R338 Other retention of urine: Secondary | ICD-10-CM | POA: Diagnosis not present

## 2022-07-22 DIAGNOSIS — Z89421 Acquired absence of other right toe(s): Secondary | ICD-10-CM | POA: Diagnosis not present

## 2022-07-22 DIAGNOSIS — I130 Hypertensive heart and chronic kidney disease with heart failure and stage 1 through stage 4 chronic kidney disease, or unspecified chronic kidney disease: Secondary | ICD-10-CM | POA: Diagnosis not present

## 2022-07-22 DIAGNOSIS — I4819 Other persistent atrial fibrillation: Secondary | ICD-10-CM | POA: Diagnosis not present

## 2022-07-22 DIAGNOSIS — I442 Atrioventricular block, complete: Secondary | ICD-10-CM | POA: Diagnosis not present

## 2022-07-22 DIAGNOSIS — J9601 Acute respiratory failure with hypoxia: Secondary | ICD-10-CM | POA: Diagnosis not present

## 2022-07-22 DIAGNOSIS — M86671 Other chronic osteomyelitis, right ankle and foot: Secondary | ICD-10-CM | POA: Diagnosis not present

## 2022-07-22 DIAGNOSIS — Z950 Presence of cardiac pacemaker: Secondary | ICD-10-CM | POA: Diagnosis not present

## 2022-07-22 DIAGNOSIS — L97512 Non-pressure chronic ulcer of other part of right foot with fat layer exposed: Secondary | ICD-10-CM | POA: Diagnosis not present

## 2022-07-22 DIAGNOSIS — Z8572 Personal history of non-Hodgkin lymphomas: Secondary | ICD-10-CM | POA: Diagnosis not present

## 2022-07-22 DIAGNOSIS — K219 Gastro-esophageal reflux disease without esophagitis: Secondary | ICD-10-CM | POA: Diagnosis not present

## 2022-07-22 DIAGNOSIS — G9341 Metabolic encephalopathy: Secondary | ICD-10-CM | POA: Diagnosis not present

## 2022-07-26 ENCOUNTER — Other Ambulatory Visit: Payer: Self-pay | Admitting: Family Medicine

## 2022-07-27 NOTE — Telephone Encounter (Signed)
Requested medication (s) are due for refill today: No  Requested medication (s) are on the active medication list: yes    Last refill: 12/24/21  #270  2 refills  Future visit scheduled no  Notes to clinic:Cannot refuse non-delegated meds per protocol. Please review. Thank you.  Requested Prescriptions  Pending Prescriptions Disp Refills   pregabalin (LYRICA) 25 MG capsule [Pharmacy Med Name: Pregabalin 25 MG Oral Capsule] 270 capsule     Sig: TAKE 1 CAPSULE BY MOUTH IN  THE MORNING AND 2 CAPSULES  IN THE EVENING     Not Delegated - Neurology:  Anticonvulsants - Controlled - pregabalin Failed - 07/26/2022  3:43 PM      Failed - This refill cannot be delegated      Failed - Cr in normal range and within 360 days    Creatinine  Date Value Ref Range Status  04/08/2014 1.4 (H) 0.7 - 1.3 mg/dL Final   Creat  Date Value Ref Range Status  05/26/2022 1.23 (H) 0.70 - 1.22 mg/dL Final         Passed - Completed PHQ-2 or PHQ-9 in the last 360 days      Passed - Valid encounter within last 12 months    Recent Outpatient Visits           5 months ago Ischemic ulcer, limited to breakdown of skin ([place])   [place] Susy Frizzle, MD   6 months ago Wound of right lower extremity, subsequent encounter   [place] Susy Frizzle, MD   6 months ago Wound of right lower extremity, subsequent encounter   [place] Susy Frizzle, MD   7 months ago Stage 3b chronic kidney disease ([place])   [place] Susy Frizzle, MD   7 months ago Wound of right lower extremity, initial encounter   [place] Pickard, Cammie Mcgee, MD

## 2022-07-28 DIAGNOSIS — N184 Chronic kidney disease, stage 4 (severe): Secondary | ICD-10-CM | POA: Diagnosis not present

## 2022-07-28 DIAGNOSIS — K219 Gastro-esophageal reflux disease without esophagitis: Secondary | ICD-10-CM | POA: Diagnosis not present

## 2022-07-28 DIAGNOSIS — I509 Heart failure, unspecified: Secondary | ICD-10-CM | POA: Diagnosis not present

## 2022-07-28 DIAGNOSIS — I442 Atrioventricular block, complete: Secondary | ICD-10-CM | POA: Diagnosis not present

## 2022-07-28 DIAGNOSIS — I872 Venous insufficiency (chronic) (peripheral): Secondary | ICD-10-CM | POA: Diagnosis not present

## 2022-07-28 DIAGNOSIS — E039 Hypothyroidism, unspecified: Secondary | ICD-10-CM | POA: Diagnosis not present

## 2022-07-28 DIAGNOSIS — Z89421 Acquired absence of other right toe(s): Secondary | ICD-10-CM | POA: Diagnosis not present

## 2022-07-28 DIAGNOSIS — Z950 Presence of cardiac pacemaker: Secondary | ICD-10-CM | POA: Diagnosis not present

## 2022-07-28 DIAGNOSIS — R338 Other retention of urine: Secondary | ICD-10-CM | POA: Diagnosis not present

## 2022-07-28 DIAGNOSIS — I130 Hypertensive heart and chronic kidney disease with heart failure and stage 1 through stage 4 chronic kidney disease, or unspecified chronic kidney disease: Secondary | ICD-10-CM | POA: Diagnosis not present

## 2022-07-28 DIAGNOSIS — L97512 Non-pressure chronic ulcer of other part of right foot with fat layer exposed: Secondary | ICD-10-CM | POA: Diagnosis not present

## 2022-07-28 DIAGNOSIS — I739 Peripheral vascular disease, unspecified: Secondary | ICD-10-CM | POA: Diagnosis not present

## 2022-07-28 DIAGNOSIS — Z8572 Personal history of non-Hodgkin lymphomas: Secondary | ICD-10-CM | POA: Diagnosis not present

## 2022-07-28 DIAGNOSIS — Z466 Encounter for fitting and adjustment of urinary device: Secondary | ICD-10-CM | POA: Diagnosis not present

## 2022-07-28 DIAGNOSIS — I4819 Other persistent atrial fibrillation: Secondary | ICD-10-CM | POA: Diagnosis not present

## 2022-07-28 DIAGNOSIS — Z87891 Personal history of nicotine dependence: Secondary | ICD-10-CM | POA: Diagnosis not present

## 2022-07-29 DIAGNOSIS — I442 Atrioventricular block, complete: Secondary | ICD-10-CM | POA: Diagnosis not present

## 2022-07-29 DIAGNOSIS — L97512 Non-pressure chronic ulcer of other part of right foot with fat layer exposed: Secondary | ICD-10-CM | POA: Diagnosis not present

## 2022-07-29 DIAGNOSIS — I4819 Other persistent atrial fibrillation: Secondary | ICD-10-CM | POA: Diagnosis not present

## 2022-07-29 DIAGNOSIS — I739 Peripheral vascular disease, unspecified: Secondary | ICD-10-CM | POA: Diagnosis not present

## 2022-07-29 DIAGNOSIS — Z89421 Acquired absence of other right toe(s): Secondary | ICD-10-CM | POA: Diagnosis not present

## 2022-07-29 DIAGNOSIS — E039 Hypothyroidism, unspecified: Secondary | ICD-10-CM | POA: Diagnosis not present

## 2022-07-29 DIAGNOSIS — Z466 Encounter for fitting and adjustment of urinary device: Secondary | ICD-10-CM | POA: Diagnosis not present

## 2022-07-29 DIAGNOSIS — Z950 Presence of cardiac pacemaker: Secondary | ICD-10-CM | POA: Diagnosis not present

## 2022-07-29 DIAGNOSIS — Z87891 Personal history of nicotine dependence: Secondary | ICD-10-CM | POA: Diagnosis not present

## 2022-07-29 DIAGNOSIS — I872 Venous insufficiency (chronic) (peripheral): Secondary | ICD-10-CM | POA: Diagnosis not present

## 2022-07-29 DIAGNOSIS — R338 Other retention of urine: Secondary | ICD-10-CM | POA: Diagnosis not present

## 2022-07-29 DIAGNOSIS — N184 Chronic kidney disease, stage 4 (severe): Secondary | ICD-10-CM | POA: Diagnosis not present

## 2022-07-29 DIAGNOSIS — I130 Hypertensive heart and chronic kidney disease with heart failure and stage 1 through stage 4 chronic kidney disease, or unspecified chronic kidney disease: Secondary | ICD-10-CM | POA: Diagnosis not present

## 2022-07-29 DIAGNOSIS — K219 Gastro-esophageal reflux disease without esophagitis: Secondary | ICD-10-CM | POA: Diagnosis not present

## 2022-07-29 DIAGNOSIS — Z8572 Personal history of non-Hodgkin lymphomas: Secondary | ICD-10-CM | POA: Diagnosis not present

## 2022-07-29 DIAGNOSIS — I509 Heart failure, unspecified: Secondary | ICD-10-CM | POA: Diagnosis not present

## 2022-08-02 DIAGNOSIS — I739 Peripheral vascular disease, unspecified: Secondary | ICD-10-CM | POA: Diagnosis not present

## 2022-08-02 DIAGNOSIS — I130 Hypertensive heart and chronic kidney disease with heart failure and stage 1 through stage 4 chronic kidney disease, or unspecified chronic kidney disease: Secondary | ICD-10-CM | POA: Diagnosis not present

## 2022-08-02 DIAGNOSIS — N184 Chronic kidney disease, stage 4 (severe): Secondary | ICD-10-CM | POA: Diagnosis not present

## 2022-08-02 DIAGNOSIS — I509 Heart failure, unspecified: Secondary | ICD-10-CM | POA: Diagnosis not present

## 2022-08-02 DIAGNOSIS — L97912 Non-pressure chronic ulcer of unspecified part of right lower leg with fat layer exposed: Secondary | ICD-10-CM | POA: Diagnosis not present

## 2022-08-03 DIAGNOSIS — L97512 Non-pressure chronic ulcer of other part of right foot with fat layer exposed: Secondary | ICD-10-CM | POA: Diagnosis not present

## 2022-08-03 DIAGNOSIS — Z8572 Personal history of non-Hodgkin lymphomas: Secondary | ICD-10-CM | POA: Diagnosis not present

## 2022-08-03 DIAGNOSIS — I872 Venous insufficiency (chronic) (peripheral): Secondary | ICD-10-CM | POA: Diagnosis not present

## 2022-08-03 DIAGNOSIS — I739 Peripheral vascular disease, unspecified: Secondary | ICD-10-CM | POA: Diagnosis not present

## 2022-08-03 DIAGNOSIS — Z466 Encounter for fitting and adjustment of urinary device: Secondary | ICD-10-CM | POA: Diagnosis not present

## 2022-08-03 DIAGNOSIS — K219 Gastro-esophageal reflux disease without esophagitis: Secondary | ICD-10-CM | POA: Diagnosis not present

## 2022-08-03 DIAGNOSIS — I4819 Other persistent atrial fibrillation: Secondary | ICD-10-CM | POA: Diagnosis not present

## 2022-08-03 DIAGNOSIS — E039 Hypothyroidism, unspecified: Secondary | ICD-10-CM | POA: Diagnosis not present

## 2022-08-03 DIAGNOSIS — Z87891 Personal history of nicotine dependence: Secondary | ICD-10-CM | POA: Diagnosis not present

## 2022-08-03 DIAGNOSIS — Z950 Presence of cardiac pacemaker: Secondary | ICD-10-CM | POA: Diagnosis not present

## 2022-08-03 DIAGNOSIS — I130 Hypertensive heart and chronic kidney disease with heart failure and stage 1 through stage 4 chronic kidney disease, or unspecified chronic kidney disease: Secondary | ICD-10-CM | POA: Diagnosis not present

## 2022-08-03 DIAGNOSIS — Z89421 Acquired absence of other right toe(s): Secondary | ICD-10-CM | POA: Diagnosis not present

## 2022-08-03 DIAGNOSIS — N184 Chronic kidney disease, stage 4 (severe): Secondary | ICD-10-CM | POA: Diagnosis not present

## 2022-08-03 DIAGNOSIS — I442 Atrioventricular block, complete: Secondary | ICD-10-CM | POA: Diagnosis not present

## 2022-08-03 DIAGNOSIS — I509 Heart failure, unspecified: Secondary | ICD-10-CM | POA: Diagnosis not present

## 2022-08-03 DIAGNOSIS — R338 Other retention of urine: Secondary | ICD-10-CM | POA: Diagnosis not present

## 2022-08-05 DIAGNOSIS — Z8572 Personal history of non-Hodgkin lymphomas: Secondary | ICD-10-CM | POA: Diagnosis not present

## 2022-08-05 DIAGNOSIS — Z87891 Personal history of nicotine dependence: Secondary | ICD-10-CM | POA: Diagnosis not present

## 2022-08-05 DIAGNOSIS — R338 Other retention of urine: Secondary | ICD-10-CM | POA: Diagnosis not present

## 2022-08-05 DIAGNOSIS — I442 Atrioventricular block, complete: Secondary | ICD-10-CM | POA: Diagnosis not present

## 2022-08-05 DIAGNOSIS — L97512 Non-pressure chronic ulcer of other part of right foot with fat layer exposed: Secondary | ICD-10-CM | POA: Diagnosis not present

## 2022-08-05 DIAGNOSIS — E039 Hypothyroidism, unspecified: Secondary | ICD-10-CM | POA: Diagnosis not present

## 2022-08-05 DIAGNOSIS — Z89421 Acquired absence of other right toe(s): Secondary | ICD-10-CM | POA: Diagnosis not present

## 2022-08-05 DIAGNOSIS — K219 Gastro-esophageal reflux disease without esophagitis: Secondary | ICD-10-CM | POA: Diagnosis not present

## 2022-08-05 DIAGNOSIS — I509 Heart failure, unspecified: Secondary | ICD-10-CM | POA: Diagnosis not present

## 2022-08-05 DIAGNOSIS — I130 Hypertensive heart and chronic kidney disease with heart failure and stage 1 through stage 4 chronic kidney disease, or unspecified chronic kidney disease: Secondary | ICD-10-CM | POA: Diagnosis not present

## 2022-08-05 DIAGNOSIS — Z466 Encounter for fitting and adjustment of urinary device: Secondary | ICD-10-CM | POA: Diagnosis not present

## 2022-08-05 DIAGNOSIS — I739 Peripheral vascular disease, unspecified: Secondary | ICD-10-CM | POA: Diagnosis not present

## 2022-08-05 DIAGNOSIS — N184 Chronic kidney disease, stage 4 (severe): Secondary | ICD-10-CM | POA: Diagnosis not present

## 2022-08-05 DIAGNOSIS — I872 Venous insufficiency (chronic) (peripheral): Secondary | ICD-10-CM | POA: Diagnosis not present

## 2022-08-05 DIAGNOSIS — I4819 Other persistent atrial fibrillation: Secondary | ICD-10-CM | POA: Diagnosis not present

## 2022-08-05 DIAGNOSIS — Z950 Presence of cardiac pacemaker: Secondary | ICD-10-CM | POA: Diagnosis not present

## 2022-08-09 DIAGNOSIS — I509 Heart failure, unspecified: Secondary | ICD-10-CM | POA: Diagnosis not present

## 2022-08-09 DIAGNOSIS — E039 Hypothyroidism, unspecified: Secondary | ICD-10-CM | POA: Diagnosis not present

## 2022-08-09 DIAGNOSIS — I4819 Other persistent atrial fibrillation: Secondary | ICD-10-CM | POA: Diagnosis not present

## 2022-08-09 DIAGNOSIS — R338 Other retention of urine: Secondary | ICD-10-CM | POA: Diagnosis not present

## 2022-08-09 DIAGNOSIS — I130 Hypertensive heart and chronic kidney disease with heart failure and stage 1 through stage 4 chronic kidney disease, or unspecified chronic kidney disease: Secondary | ICD-10-CM | POA: Diagnosis not present

## 2022-08-09 DIAGNOSIS — K219 Gastro-esophageal reflux disease without esophagitis: Secondary | ICD-10-CM | POA: Diagnosis not present

## 2022-08-09 DIAGNOSIS — Z87891 Personal history of nicotine dependence: Secondary | ICD-10-CM | POA: Diagnosis not present

## 2022-08-09 DIAGNOSIS — Z89421 Acquired absence of other right toe(s): Secondary | ICD-10-CM | POA: Diagnosis not present

## 2022-08-09 DIAGNOSIS — L97512 Non-pressure chronic ulcer of other part of right foot with fat layer exposed: Secondary | ICD-10-CM | POA: Diagnosis not present

## 2022-08-09 DIAGNOSIS — I872 Venous insufficiency (chronic) (peripheral): Secondary | ICD-10-CM | POA: Diagnosis not present

## 2022-08-09 DIAGNOSIS — Z950 Presence of cardiac pacemaker: Secondary | ICD-10-CM | POA: Diagnosis not present

## 2022-08-09 DIAGNOSIS — N184 Chronic kidney disease, stage 4 (severe): Secondary | ICD-10-CM | POA: Diagnosis not present

## 2022-08-09 DIAGNOSIS — I442 Atrioventricular block, complete: Secondary | ICD-10-CM | POA: Diagnosis not present

## 2022-08-09 DIAGNOSIS — Z8572 Personal history of non-Hodgkin lymphomas: Secondary | ICD-10-CM | POA: Diagnosis not present

## 2022-08-09 DIAGNOSIS — I739 Peripheral vascular disease, unspecified: Secondary | ICD-10-CM | POA: Diagnosis not present

## 2022-08-09 DIAGNOSIS — Z466 Encounter for fitting and adjustment of urinary device: Secondary | ICD-10-CM | POA: Diagnosis not present

## 2022-08-11 ENCOUNTER — Other Ambulatory Visit: Payer: Self-pay | Admitting: Family Medicine

## 2022-08-11 DIAGNOSIS — Z466 Encounter for fitting and adjustment of urinary device: Secondary | ICD-10-CM | POA: Diagnosis not present

## 2022-08-11 DIAGNOSIS — N184 Chronic kidney disease, stage 4 (severe): Secondary | ICD-10-CM | POA: Diagnosis not present

## 2022-08-11 DIAGNOSIS — I739 Peripheral vascular disease, unspecified: Secondary | ICD-10-CM | POA: Diagnosis not present

## 2022-08-11 DIAGNOSIS — Z950 Presence of cardiac pacemaker: Secondary | ICD-10-CM | POA: Diagnosis not present

## 2022-08-11 DIAGNOSIS — I509 Heart failure, unspecified: Secondary | ICD-10-CM | POA: Diagnosis not present

## 2022-08-11 DIAGNOSIS — I4819 Other persistent atrial fibrillation: Secondary | ICD-10-CM | POA: Diagnosis not present

## 2022-08-11 DIAGNOSIS — I872 Venous insufficiency (chronic) (peripheral): Secondary | ICD-10-CM | POA: Diagnosis not present

## 2022-08-11 DIAGNOSIS — I130 Hypertensive heart and chronic kidney disease with heart failure and stage 1 through stage 4 chronic kidney disease, or unspecified chronic kidney disease: Secondary | ICD-10-CM | POA: Diagnosis not present

## 2022-08-11 DIAGNOSIS — R338 Other retention of urine: Secondary | ICD-10-CM | POA: Diagnosis not present

## 2022-08-11 DIAGNOSIS — Z8572 Personal history of non-Hodgkin lymphomas: Secondary | ICD-10-CM | POA: Diagnosis not present

## 2022-08-11 DIAGNOSIS — E039 Hypothyroidism, unspecified: Secondary | ICD-10-CM | POA: Diagnosis not present

## 2022-08-11 DIAGNOSIS — Z87891 Personal history of nicotine dependence: Secondary | ICD-10-CM | POA: Diagnosis not present

## 2022-08-11 DIAGNOSIS — L97512 Non-pressure chronic ulcer of other part of right foot with fat layer exposed: Secondary | ICD-10-CM | POA: Diagnosis not present

## 2022-08-11 DIAGNOSIS — Z89421 Acquired absence of other right toe(s): Secondary | ICD-10-CM | POA: Diagnosis not present

## 2022-08-11 DIAGNOSIS — K219 Gastro-esophageal reflux disease without esophagitis: Secondary | ICD-10-CM | POA: Diagnosis not present

## 2022-08-11 DIAGNOSIS — I442 Atrioventricular block, complete: Secondary | ICD-10-CM | POA: Diagnosis not present

## 2022-08-21 DIAGNOSIS — J9601 Acute respiratory failure with hypoxia: Secondary | ICD-10-CM | POA: Diagnosis not present

## 2022-08-21 DIAGNOSIS — M7989 Other specified soft tissue disorders: Secondary | ICD-10-CM | POA: Diagnosis not present

## 2022-08-21 DIAGNOSIS — I739 Peripheral vascular disease, unspecified: Secondary | ICD-10-CM | POA: Diagnosis not present

## 2022-08-23 ENCOUNTER — Ambulatory Visit (INDEPENDENT_AMBULATORY_CARE_PROVIDER_SITE_OTHER): Payer: Medicare Other | Admitting: Family Medicine

## 2022-08-23 VITALS — BP 118/76 | HR 65 | Temp 97.6°F

## 2022-08-23 DIAGNOSIS — J209 Acute bronchitis, unspecified: Secondary | ICD-10-CM | POA: Diagnosis not present

## 2022-08-23 MED ORDER — PREDNISONE 20 MG PO TABS: ORAL_TABLET | ORAL | 0 refills | Status: DC

## 2022-08-23 MED ORDER — DOXYCYCLINE HYCLATE 100 MG PO TABS: 100.0000 mg | ORAL_TABLET | Freq: Two times a day (BID) | ORAL | 0 refills | Status: DC

## 2022-08-23 NOTE — Progress Notes (Signed)
Subjective:    Patient ID: [name], male    DOB: 1919-11-08, 88 y.o.   MRN: [id number] Patient reports a 2-day history of cough and chest congestion.  His daughter denies any fevers.  She denies any confusion.  Cough is nonproductive.  However he has a "rattle in his lungs".  He is also heard wheezing at night.  He denies any chest pain or pleurisy or hemoptysis or purulent sputum.  He does have some head congestion.  On examination today, he has right basilar rhonchorous breath sounds.  He also has some expiratory wheezes in the left lung.  I suspect that he has a viral upper respiratory infection triggering bronchospasms and that the rhonchorous breath sounds are due to mucus in the airways.  He states that he is having a difficult time coughing up the mucus. Past Medical History:  Diagnosis Date   Acid reflux    Cancer ([place]) 09/05/13   left neck-non-hodgkins lymphoma   CKD (chronic kidney disease), stage IV ([place]) 11/07/2018   Hypertension    requires diuretic   Hypothyroidism    Malaria    while in the service   Peripheral vascular disease ([place])    slow wound healing on legs   Past Surgical History:  Procedure Laterality Date   AMPUTATION Left 06/25/2015   Procedure: LEFT  HALLUX AMPUTATION,;  Surgeon: Wylene Simmer, MD;  Location: [facility];  Service: Orthopedics;  Laterality: Left;  LOCAL/MAC   CHOLECYSTECTOMY N/A 11/08/2018   Procedure: LAPAROSCOPIC CHOLECYSTECTOMY WITH INTRAOPERATIVE CHOLANGIOGRAM;  Surgeon: [name], MD;  Location: [facility];  Service: General;  Laterality: N/A;   MASS BIOPSY Left 09/05/2013   Procedure: EXCISIONAL BIOPSY OF LEFT NECK MASS;  Surgeon: Jerrell Belfast, MD;  Location: [facility];  Service: ENT;  Laterality: Left;   PACEMAKER IMPLANT N/A 05/28/2020   Procedure: PACEMAKER IMPLANT;  Surgeon: Thompson Grayer, MD;  Location: [facility];  Service: Cardiovascular;  Laterality: N/A;   TONSILLECTOMY     YAG LASER APPLICATION Right  7/35/3299   Procedure: YAG LASER APPLICATION;  Surgeon: [name], MD;  Location: [facility];  Service: Ophthalmology;  Laterality: Right;   Current Outpatient Medications on File Prior to Visit  Medication Sig Dispense Refill   aspirin EC 81 MG tablet Take 81 mg by mouth daily. Swallow whole.     betamethasone dipropionate 0.05 % cream Apply topically 2 (two) times daily. Apply small amount to red areas on legs twice daily. 45 g 1   cephALEXin (KEFLEX) 500 MG capsule Take 1 capsule (500 mg total) by mouth 3 (three) times daily. 21 capsule 0   diphenhydramine-acetaminophen (TYLENOL PM) 25-500 MG TABS tablet Take 1 tablet by mouth at bedtime as needed.     feeding supplement (ENSURE ENLIVE / ENSURE PLUS) LIQD Take 237 mLs by mouth 3 (three) times daily between meals.     furosemide (LASIX) 20 MG tablet Hold until outpatient followup due to acute kidney injury. 180 tablet 3   levothyroxine (SYNTHROID) 112 MCG tablet TAKE 1 TABLET BY MOUTH DAILY 90 tablet 3   megestrol (MEGACE) 400 MG/10ML suspension Take 10 mLs (400 mg total) by mouth daily.  0   melatonin 5 MG TABS Take 1 tablet (5 mg total) by mouth at bedtime.  0   mometasone (ELOCON) 0.1 % cream USE AS DIRECTED 45 g 1   pregabalin (LYRICA) 25 MG capsule TAKE 1 CAPSULE BY MOUTH IN THE  MORNING AND 2 CAPSULES IN  THE  EVENING 270 capsule 3   QUEtiapine (SEROQUEL) 25 MG tablet Take 1 tablet (25 mg total) by mouth at bedtime as needed (to help with sleep).     QUEtiapine (SEROQUEL) 25 MG tablet TAKE 1 TABLET BY MOUTH EVERYDAY AT BEDTIME 90 tablet 1   silver sulfADIAZINE (SILVADENE) 1 % cream Apply 1 application topically daily. 50 g 2   tamsulosin (FLOMAX) 0.4 MG CAPS capsule Take 1 capsule (0.4 mg total) by mouth daily after supper.     triamcinolone (KENALOG) 0.1 % Apply 1 application topically 3 (three) times daily. 30 g 1   vitamin B-12 (CYANOCOBALAMIN) 1000 MCG tablet Take 1 tablet (1,000 mcg total) by mouth daily. 30 tablet 5   No current  facility-administered medications on file prior to visit.   Allergies  Allergen Reactions   Codeine Nausea And Vomiting   Percocet [Oxycodone-Acetaminophen] Nausea And Vomiting   Social History   Socioeconomic History   Marital status: Widowed    Spouse name: Not on file   Number of children: 4   Years of education: Not on file   Highest education level: Not on file  Occupational History   Occupation: retired  Tobacco Use   Smoking status: Former    Types: Cigars   Smokeless tobacco: Former    Quit date: 10/05/1979  Substance and Sexual Activity   Alcohol use: No   Drug use: No   Sexual activity: Not Currently  Other Topics Concern   Not on file  Social History Narrative   Children all live nearby and rotate staying with patient at night.    Wife died in 01-28-2020, dementia, age 84.   Social Determinants of Health   Financial Resource Strain: Low Risk  (09/24/2021)   Overall Financial Resource Strain (CARDIA)    Difficulty of Paying Living Expenses: Not hard at all  Food Insecurity: No Food Insecurity (09/24/2021)   Hunger Vital Sign    Worried About Running Out of Food in the Last Year: Never true    Ran Out of Food in the Last Year: Never true  Transportation Needs: No Transportation Needs (09/24/2021)   PRAPARE - Hydrologist (Medical): No    Lack of Transportation (Non-Medical): No  Physical Activity: Insufficiently Active (09/24/2021)   Exercise Vital Sign    Days of Exercise per Week: 3 days    Minutes of Exercise per Session: 20 min  Stress: No Stress Concern Present (09/24/2021)   [place]    Feeling of Stress : Not at all  Social Connections: Socially Isolated (09/24/2021)   Social Connection and Isolation Panel [NHANES]    Frequency of Communication with Friends and Family: More than three times a week    Frequency of Social Gatherings with Friends and Family:  More than three times a week    Attends Religious Services: Never    Marine scientist or Organizations: No    Attends Archivist Meetings: Never    Marital Status: Widowed  Intimate Partner Violence: Not At Risk (09/24/2021)   Humiliation, Afraid, Rape, and Kick questionnaire    Fear of Current or Ex-Partner: No    Emotionally Abused: No    Physically Abused: No    Sexually Abused: No      Review of Systems  All other systems reviewed and are negative.      Objective:   Physical Exam Vitals reviewed.  Constitutional:  General: He is not in acute distress.    Appearance: He is well-developed. He is not diaphoretic.  HENT:     Right Ear: Tympanic membrane and ear canal normal.     Left Ear: Tympanic membrane and ear canal normal.     Nose: Congestion and rhinorrhea present.  Cardiovascular:     Rate and Rhythm: Normal rate and regular rhythm.     Pulses:          Dorsalis pedis pulses are 1+ on the right side.       Posterior tibial pulses are 1+ on the right side.     Heart sounds: Normal heart sounds. No murmur heard.    No friction rub. No gallop.  Pulmonary:     Effort: Pulmonary effort is normal. No respiratory distress.     Breath sounds: No stridor. Wheezing and rhonchi present. No rales.  Chest:     Chest wall: No tenderness.  Musculoskeletal:     Lumbar back: No spasms, tenderness or bony tenderness. Normal range of motion.     Right hip: No tenderness. Normal range of motion. Decreased strength.     Left hip: No tenderness. Normal range of motion. Decreased strength.     Right lower leg: No edema.  Feet:     Right foot:     Skin integrity: Ulcer, skin breakdown and fissure present. No erythema, warmth or callus.  Lymphadenopathy:     Cervical: No cervical adenopathy.  Skin:    Findings: No bruising or erythema.  Neurological:     Motor: No weakness.     Coordination: Coordination normal.     Gait: Gait abnormal.            Assessment & Plan:  Bronchitis with bronchospasm Patient is almost 88 years old.  I believe he has a viral upper respiratory infection but he is now starting to develop an infection in his lower respiratory tract signified by the rhonchorous breath sounds and wheezing.  Therefore I believe that this is turning into a bronchitis.  Although still a virus I have recommended prednisone for the wheezing and bronchospasm.  I will start him on a prednisone taper pack.  I also recommended Mucinex as a mucolytic to try to break up the congestion.  I recommended incentive spirometry to try to improve pulmonary toilet.  I gave him a prescription for doxycycline.  At the present time I do not feel that he needs this however if he starts running fevers and if the cough worsens I want to err on the side of caution given his advanced age and compromised health

## 2022-08-26 ENCOUNTER — Ambulatory Visit (INDEPENDENT_AMBULATORY_CARE_PROVIDER_SITE_OTHER): Payer: Medicare Other

## 2022-08-26 DIAGNOSIS — I442 Atrioventricular block, complete: Secondary | ICD-10-CM

## 2022-08-26 LAB — CUP PACEART REMOTE DEVICE CHECK
Battery Remaining Longevity: 106 mo
Battery Remaining Percentage: 77 %
Battery Voltage: 3.02 V
Brady Statistic RV Percent Paced: 95 %
Date Time Interrogation Session: 20230929020012
Implantable Lead Implant Date: 20210701
Implantable Lead Implant Date: 20210701
Implantable Lead Location: 753859
Implantable Lead Location: 753860
Implantable Pulse Generator Implant Date: 20210701
Lead Channel Impedance Value: 510 Ohm
Lead Channel Pacing Threshold Amplitude: 0.75 V
Lead Channel Pacing Threshold Pulse Width: 0.5 ms
Lead Channel Sensing Intrinsic Amplitude: 11.5 mV
Lead Channel Setting Pacing Amplitude: 1 V
Lead Channel Setting Pacing Pulse Width: 0.5 ms
Lead Channel Setting Sensing Sensitivity: 2 mV
Pulse Gen Model: 2272
Pulse Gen Serial Number: 3842377

## 2022-08-29 ENCOUNTER — Encounter: Payer: Self-pay | Admitting: Family Medicine

## 2022-08-31 NOTE — Progress Notes (Signed)
Remote pacemaker transmission.   

## 2022-09-07 ENCOUNTER — Ambulatory Visit (INDEPENDENT_AMBULATORY_CARE_PROVIDER_SITE_OTHER): Payer: Medicare Other

## 2022-09-07 DIAGNOSIS — Z23 Encounter for immunization: Secondary | ICD-10-CM

## 2022-09-07 DIAGNOSIS — I442 Atrioventricular block, complete: Secondary | ICD-10-CM

## 2022-09-07 DIAGNOSIS — N1832 Chronic kidney disease, stage 3b: Secondary | ICD-10-CM

## 2022-09-20 DIAGNOSIS — J9601 Acute respiratory failure with hypoxia: Secondary | ICD-10-CM | POA: Diagnosis not present

## 2022-09-20 DIAGNOSIS — M7989 Other specified soft tissue disorders: Secondary | ICD-10-CM | POA: Diagnosis not present

## 2022-09-20 DIAGNOSIS — I739 Peripheral vascular disease, unspecified: Secondary | ICD-10-CM | POA: Diagnosis not present

## 2022-10-10 ENCOUNTER — Telehealth: Payer: Self-pay | Admitting: Pharmacist

## 2022-10-10 NOTE — Progress Notes (Signed)
Chronic Care Management Pharmacy Assistant   Name: [name]  MRN: [id number] DOB: 16-Mar-1919   Reason for Encounter: Disease State - Hypertension Call     Recent office visits:  08/23/22 Jenna Luo, MD - Family Medicine - Bronchitis - doxycycline (VIBRA-TABS) 100 MG tablet and predniSONE (DELTASONE) 20 MG tablet  prescribed. Follow up as scheduled.   05/23/22 Jenna Luo, MD - Family Medicine - Respiratory crackles - Labs were ordered. CXR ordered. QUEtiapine (SEROQUEL) 25 MG tablet  prescribed. Follow up as scheduled.   Recent consult visits:  Several visits with [facility] for acute respiratory failure  Hospital visits: 04/12/22 - 04/21/22 Medication Reconciliation was completed by comparing discharge summary, patient's EMR and Pharmacy list, and upon discussion with patient.  Admitted to the hospital on 04/12/22 due to Osteomyelitis. Discharge date was 04/21/22. Discharged from [facility]?Medications Started at [facility] Discharge:?? amoxicillin-clavulanate (AUGMENTIN) doxycycline (VIBRA-TABS) feeding supplement megestrol (MEGACE) Start taking on: Apr 22, 2022 melatonin QUEtiapine (SEROQUEL) tamsulosin [facility])  Medication Changes at Hospital Discharge: furosemide (LASIX)  Medications Discontinued at Hospital Discharge: sulfamethoxazole-trimethoprim 400-80 MG tablet (BACTRIM)  Medications that remain the same after Hospital Discharge:??  All other medications will remain the same.    Medications: Outpatient Encounter Medications as of 10/10/2022  Medication Sig   aspirin EC 81 MG tablet Take 81 mg by mouth daily. Swallow whole.   betamethasone dipropionate 0.05 % cream Apply topically 2 (two) times daily. Apply small amount to red areas on legs twice daily.   diphenhydramine-acetaminophen (TYLENOL PM) 25-500 MG TABS tablet Take 1 tablet by mouth at bedtime as needed.   doxycycline (VIBRA-TABS) 100 MG tablet Take 1 tablet  (100 mg total) by mouth 2 (two) times daily.   feeding supplement (ENSURE ENLIVE / ENSURE PLUS) LIQD Take 237 mLs by mouth 3 (three) times daily between meals.   furosemide (LASIX) 20 MG tablet Hold until outpatient followup due to acute kidney injury.   levothyroxine (SYNTHROID) 112 MCG tablet TAKE 1 TABLET BY MOUTH DAILY   megestrol (MEGACE) 400 MG/10ML suspension Take 10 mLs (400 mg total) by mouth daily.   melatonin 5 MG TABS Take 1 tablet (5 mg total) by mouth at bedtime.   mometasone (ELOCON) 0.1 % cream USE AS DIRECTED   predniSONE (DELTASONE) 20 MG tablet 3 tabs poqday 1-2, 2 tabs poqday 3-4, 1 tab poqday 5-6   pregabalin (LYRICA) 25 MG capsule TAKE 1 CAPSULE BY MOUTH IN THE  MORNING AND 2 CAPSULES IN THE  EVENING   QUEtiapine (SEROQUEL) 25 MG tablet Take 1 tablet (25 mg total) by mouth at bedtime as needed (to help with sleep).   QUEtiapine (SEROQUEL) 25 MG tablet TAKE 1 TABLET BY MOUTH EVERYDAY AT BEDTIME   silver sulfADIAZINE (SILVADENE) 1 % cream Apply 1 application topically daily.   tamsulosin (FLOMAX) 0.4 MG CAPS capsule Take 1 capsule (0.4 mg total) by mouth daily after supper.   triamcinolone (KENALOG) 0.1 % Apply 1 application topically 3 (three) times daily.   vitamin B-12 (CYANOCOBALAMIN) 1000 MCG tablet Take 1 tablet (1,000 mcg total) by mouth daily.   No facility-administered encounter medications on file as of 10/10/2022.    Current antihypertensive regimen:  furosemide (LASIX) 20 MG tablet   How often are you checking your Blood Pressure?  Patient reported checking blood pressures  Current home BP readings:     What recent interventions/DTPs have been made by any provider to improve Blood Pressure control  since last CPP Visit:     Any recent hospitalizations or ED visits since last visit with CPP? Patient did have a hospitalization 04/12/22-04/21/22 for Osteomyelitis.    What diet changes have been made to improve Blood Pressure Control?     What exercise is  being done to improve your Blood Pressure Control?      Adherence Review: Is the patient currently on ACE/ARB medication?  Does the patient have >5 day gap between last estimated fill dates?       Care Gaps  AWV: done 05/17/21 Colonoscopy: aged out  DM Eye Exam: N/A DM Foot Exam: N/A Microalbumin: N/A HbgAIC: N/A DEXA: N/S Mammogram: N/A   Star Rating Drugs: No star rating drugs noted   Future Appointments  Date Time Provider [place]  11/25/2022  7:10 AM [facility] DEVICE REMOTES [facility]  02/24/2023  7:10 AM [facility] DEVICE REMOTES [facility]   Multiple attempts were made to contact patient. Attempts were unsuccessful. / ls,CMA   Jobe Gibbon, [facility] Pharmacist Assistant  [phone]

## 2022-10-21 DIAGNOSIS — M7989 Other specified soft tissue disorders: Secondary | ICD-10-CM | POA: Diagnosis not present

## 2022-10-21 DIAGNOSIS — I739 Peripheral vascular disease, unspecified: Secondary | ICD-10-CM | POA: Diagnosis not present

## 2022-10-21 DIAGNOSIS — J9601 Acute respiratory failure with hypoxia: Secondary | ICD-10-CM | POA: Diagnosis not present

## 2022-10-26 ENCOUNTER — Telehealth: Payer: Self-pay

## 2022-10-26 ENCOUNTER — Other Ambulatory Visit: Payer: Self-pay | Admitting: Family Medicine

## 2022-10-26 MED ORDER — CEPHALEXIN 500 MG PO CAPS: 500.0000 mg | ORAL_CAPSULE | Freq: Three times a day (TID) | ORAL | 0 refills | Status: DC

## 2022-10-26 NOTE — Telephone Encounter (Signed)
Daughter [name], has called stating [name] has an open wound to R leg that they have been dressing but now the wound is beginning to have  a smell. No fever but area is painful. [name] asks if there is any way an abx could be sent in for [name], since he is now homebound? Thank you!

## 2022-10-27 IMAGING — CR DG FOOT 2V*R*
2 series · 2 of 2 positions shown · non-contrast
Comparison: None.

CLINICAL DATA: Right foot nonhealing wound.

EXAM:
RIGHT FOOT - 2 VIEW

[foot ap]
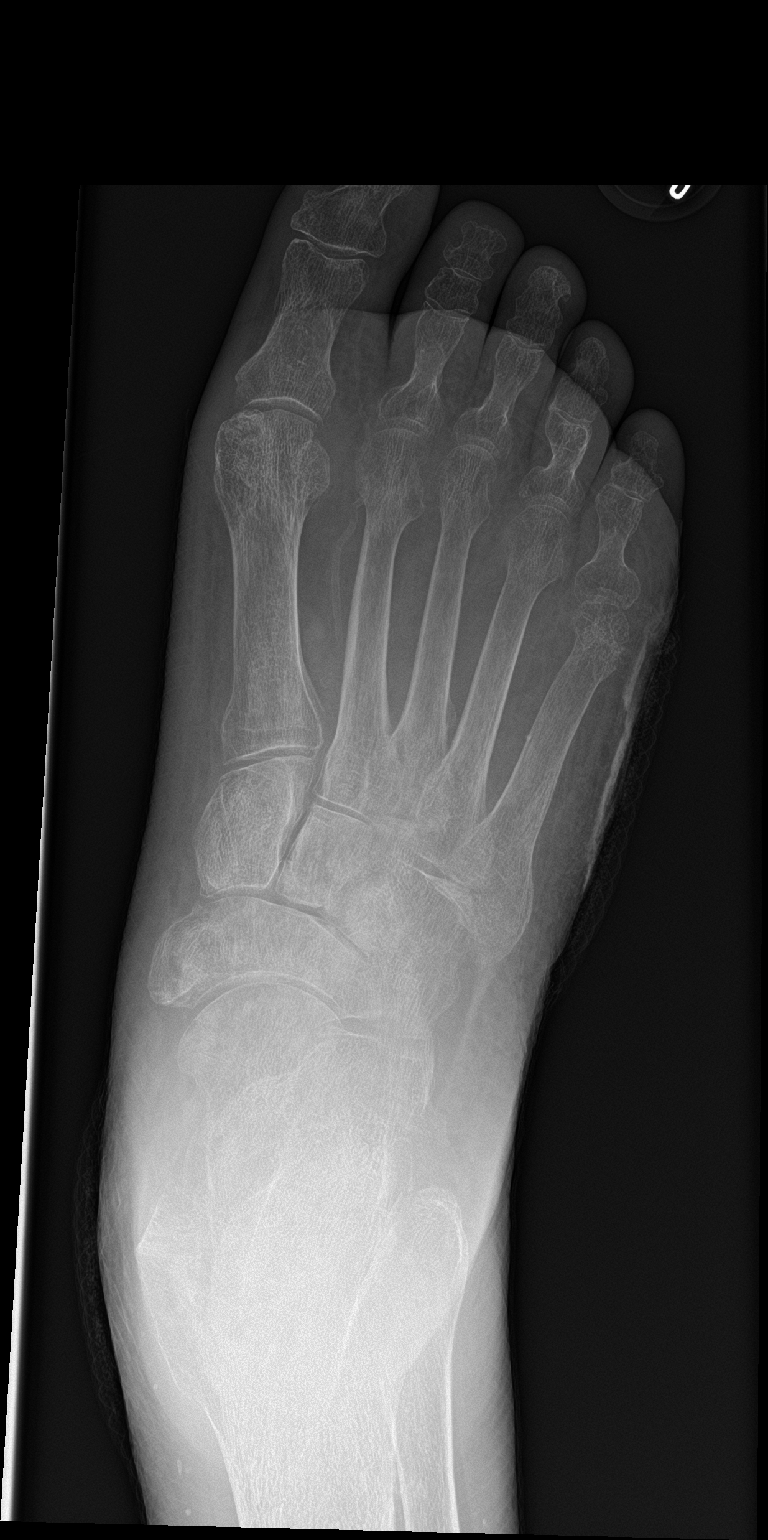

[foot lat]
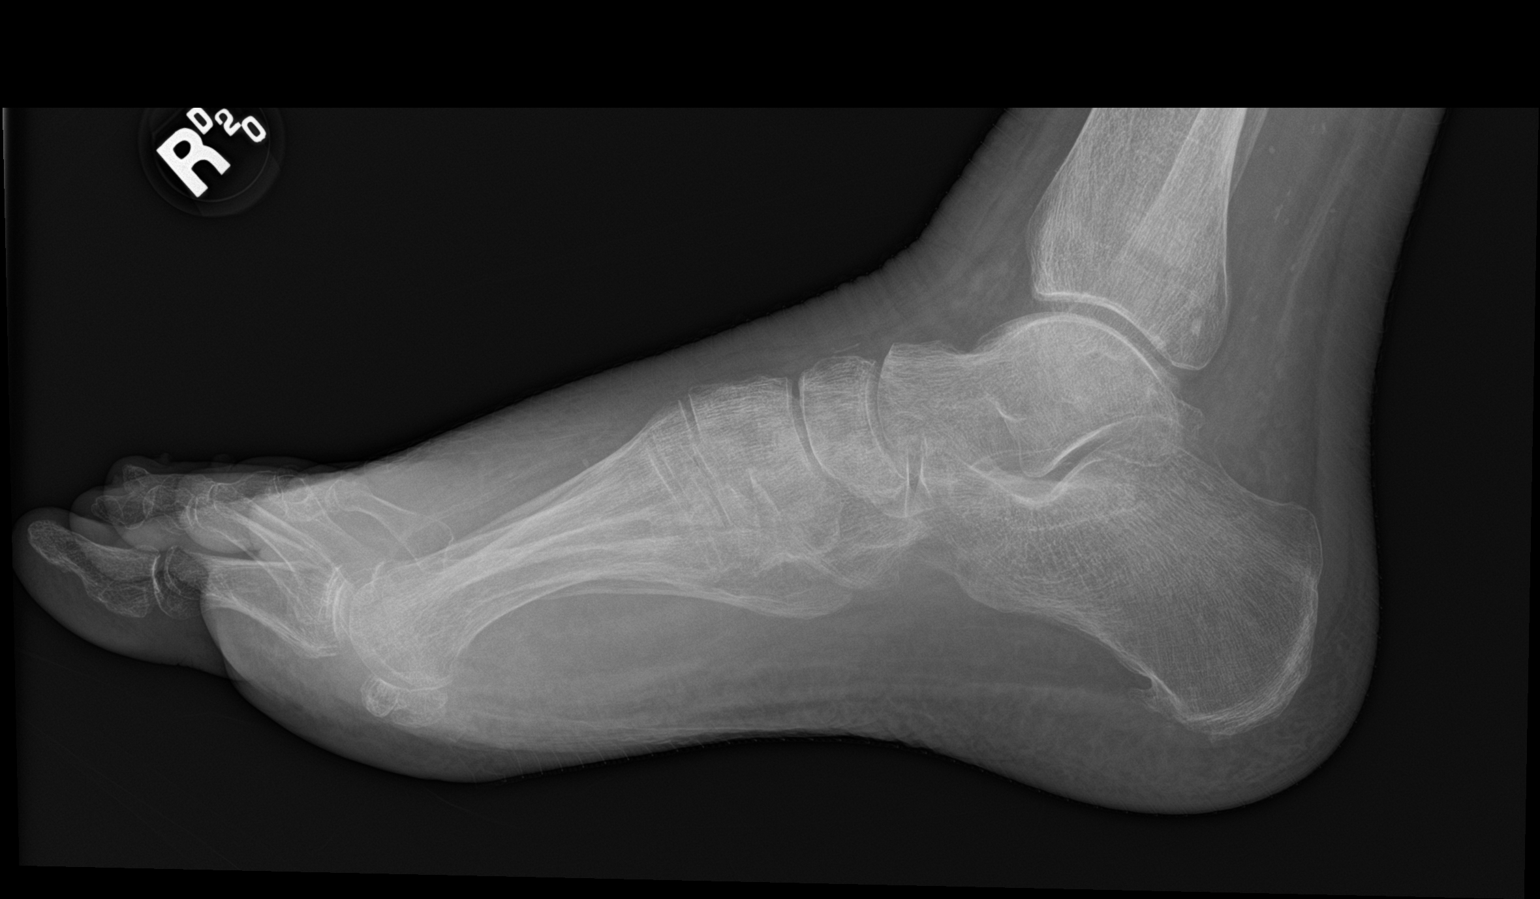

[2 of 2 positions shown; findings below may reference images not displayed]

FINDINGS: Apparent soft tissue wound along the lateral aspect of the forefoot.

There is subtle cortical resorption along the lateral margin of the
fifth metatarsal neck and head, underneath the soft tissue wound.
This is suspicious for osteomyelitis.

There are no other areas of bone resorption to suggest additional
areas of osteomyelitis. No fracture or bone lesion.

Joints are normally aligned.

Skeletal structures are diffusely demineralized.

There is diffuse soft tissue swelling most evident along the
forefoot.
IMPRESSION: 1. Subtle findings consistent with osteomyelitis of the lateral
aspect of the distal fifth metatarsal, head and neck.
2. No other evidence of osteomyelitis.  No fractures.

## 2022-10-28 ENCOUNTER — Telehealth: Payer: Self-pay

## 2022-10-28 NOTE — Telephone Encounter (Signed)
Pt's daughter, Vaughan Basta, called asking if you can pls give a referral for pt for hospice care to be provided at pt's home.   Pls advice?  Vaughan Basta, [phone] if you have any quesitons

## 2022-11-03 ENCOUNTER — Other Ambulatory Visit: Payer: Self-pay | Admitting: Family Medicine

## 2022-11-03 DIAGNOSIS — I739 Peripheral vascular disease, unspecified: Secondary | ICD-10-CM | POA: Insufficient documentation

## 2022-11-14 ENCOUNTER — Other Ambulatory Visit: Payer: Self-pay | Admitting: Family Medicine

## 2022-11-14 NOTE — Telephone Encounter (Signed)
Prescription Request  11/14/2022  Is this a "Controlled Substance" medicine? No  LOV: 08/23/2022  What is the name of the medication or equipment? QUEtiapine (SEROQUEL) 25 MG tablet  Have you contacted your pharmacy to request a refill?   Yes  Which pharmacy would you like this sent to?  [facility] [place], [place] [facility] [address] [place] [address] [place] [address] Phone: [phone] Fax: [phone]    Patient notified that their request is being sent to the clinical staff for review and that they should receive a response within 2 business days.   Please advise at [place]

## 2022-11-15 MED ORDER — QUETIAPINE FUMARATE 25 MG PO TABS: ORAL_TABLET | ORAL | 1 refills | Status: DC

## 2022-11-15 NOTE — Telephone Encounter (Signed)
Requested medication (s) are due for refill today -no  Requested medication (s) are on the active medication list - yes  Future visit scheduled -no  Last refill: 06/20/22 #90 1RF  Notes to clinic: non delegated Rx  Requested Prescriptions  Pending Prescriptions Disp Refills   QUEtiapine (SEROQUEL) 25 MG tablet 90 tablet 1     Not Delegated - Psychiatry:  Antipsychotics - Second Generation (Atypical) - quetiapine Failed - 11/14/2022  4:06 PM      Failed - This refill cannot be delegated      Failed - TSH in normal range and within 360 days    TSH  Date Value Ref Range Status  06/25/2021 0.42 0.40 - 4.50 mIU/L Final         Failed - Valid encounter within last 6 months    Recent Outpatient Visits           8 months ago Ischemic ulcer, limited to breakdown of skin ([place])   [place] Pickard, Cammie Mcgee, MD   10 months ago Wound of right lower extremity, subsequent encounter   [place] Schaumann, Cammie Mcgee, MD   10 months ago Wound of right lower extremity, subsequent encounter   [place] [facility] Schaumann, Cammie Mcgee, MD   10 months ago Stage 3b chronic kidney disease ([place])   [place] Susy Frizzle, MD   10 months ago Wound of right lower extremity, initial encounter   [place] Pickard, Cammie Mcgee, MD              Failed - Lipid Panel in normal range within the last 12 months    No results found for: "CHOL", "POCCHOL", "CHOLTOT" No results found for: "[place]", "LDLC", "HIRISKLDL", "POCLDL", "LDLDIRECT", "REALLDLC", "TOTLDLC" No results found for: "HDL", "POCHDL" No results found for: "TRIG", "POCTRIG"       Passed - Last BP in normal range    BP Readings from Last 1 Encounters:  08/23/22 118/76         Passed - Last Heart Rate in normal range    Pulse Readings from Last 1 Encounters:  08/23/22 65         Passed - CBC within normal limits and completed in the last 12 months     WBC  Date Value Ref Range Status  05/26/2022 6.4 3.8 - 10.8 Thousand/uL Final   RBC  Date Value Ref Range Status  05/26/2022 4.33 4.20 - 5.80 Million/uL Final   Hemoglobin  Date Value Ref Range Status  05/26/2022 13.1 (L) 13.2 - 17.1 g/dL Final   HGB  Date Value Ref Range Status  04/08/2014 13.7 13.0 - 17.1 g/dL Final   HCT  Date Value Ref Range Status  05/26/2022 39.5 38.5 - 50.0 % Final  04/08/2014 40.7 38.4 - 49.9 % Final   MCHC  Date Value Ref Range Status  05/26/2022 33.2 32.0 - 36.0 g/dL Final   [facility]  Date Value Ref Range Status  05/26/2022 30.3 27.0 - 33.0 pg Final   MCV  Date Value Ref Range Status  05/26/2022 91.2 80.0 - 100.0 fL Final  04/08/2014 92.8 79.3 - 98.0 fL Final   No results found for: "PLTCOUNTKUC", "LABPLAT", "POCPLA" RDW  Date Value Ref Range Status  05/26/2022 15.3 (H) 11.0 - 15.0 % Final  04/08/2014 14.0 11.0 - 14.6 % Final         Passed - CMP within normal limits and completed  in the last 12 months    Albumin  Date Value Ref Range Status  04/13/2022 3.8 3.5 - 5.0 g/dL Final  04/08/2014 3.9 3.5 - 5.0 g/dL Final   Alkaline Phosphatase  Date Value Ref Range Status  04/13/2022 57 38 - 126 U/L Final  04/08/2014 64 40 - 150 U/L Final   Alkaline phosphatase (APISO)  Date Value Ref Range Status  05/26/2022 61 35 - 144 U/L Final   ALT  Date Value Ref Range Status  05/26/2022 14 9 - 46 U/L Final  04/08/2014 13 0 - 55 U/L Final   AST  Date Value Ref Range Status  05/26/2022 18 10 - 35 U/L Final  04/08/2014 19 5 - 34 U/L Final   BUN  Date Value Ref Range Status  05/26/2022 19 7 - 25 mg/dL Final  04/08/2014 17.5 7.0 - 26.0 mg/dL Final   Calcium  Date Value Ref Range Status  05/26/2022 9.0 8.6 - 10.3 mg/dL Final  04/08/2014 9.6 8.4 - 10.4 mg/dL Final   Calcium, Ion  Date Value Ref Range Status  02/13/2012 1.23 1.12 - 1.32 mmol/L Final   CO2  Date Value Ref Range Status  05/26/2022 26 20 - 32 mmol/L Final  04/08/2014 26 22  - 29 mEq/L Final   TCO2  Date Value Ref Range Status  02/13/2012 27 0 - 100 mmol/L Final   Creatinine  Date Value Ref Range Status  04/08/2014 1.4 (H) 0.7 - 1.3 mg/dL Final   Creat  Date Value Ref Range Status  05/26/2022 1.23 (H) 0.70 - 1.22 mg/dL Final   Glucose  Date Value Ref Range Status  04/08/2014 91 70 - 140 mg/dl Final   Glucose, Bld  Date Value Ref Range Status  05/26/2022 85 65 - 99 mg/dL Final    Comment:    .            Fasting reference interval .    Potassium  Date Value Ref Range Status  05/26/2022 4.6 3.5 - 5.3 mmol/L Final  04/08/2014 4.2 3.5 - 5.1 mEq/L Final   Sodium  Date Value Ref Range Status  05/26/2022 143 135 - 146 mmol/L Final  04/08/2014 146 (H) 136 - 145 mEq/L Final   Total Bilirubin  Date Value Ref Range Status  05/26/2022 0.5 0.2 - 1.2 mg/dL Final  04/08/2014 0.66 0.20 - 1.20 mg/dL Final   Bilirubin, Direct  Date Value Ref Range Status  11/07/2018 2.0 (H) 0.0 - 0.2 mg/dL Final   Indirect Bilirubin  Date Value Ref Range Status  11/07/2018 1.4 (H) 0.3 - 0.9 mg/dL Final    Comment:    Performed at [facility], [place] [address], [place], [place] [address]   Protein, ur  Date Value Ref Range Status  04/14/2022 NEGATIVE NEGATIVE mg/dL Final   Total Protein  Date Value Ref Range Status  05/26/2022 6.9 6.1 - 8.1 g/dL Final  04/08/2014 6.7 6.4 - 8.3 g/dL Final   GFR, Est African American  Date Value Ref Range Status  05/18/2020 40 (L) > OR = 60 mL/min/1.105m Final   GFR calc Af Amer  Date Value Ref Range Status  05/27/2020 50 (L) >60 mL/min Final   eGFR  Date Value Ref Range Status  05/26/2022 52 (L) > OR = 60 mL/min/1.729mFinal    Comment:    The eGFR is based on the CKD-EPI 2021 equation. To calculate  the new eGFR from a previous Creatinine or Cystatin C result, go  to https://www.kidney.org/professionals/ kdoqi/gfr%5Fcalculator    GFR, Est Non African American  Date Value Ref Range Status   05/18/2020 35 (L) > OR = 60 mL/min/1.27m Final   GFR, Estimated  Date Value Ref Range Status  04/18/2022 39 (L) >60 mL/min Final    Comment:    (NOTE) Calculated using the CKD-EPI Creatinine Equation (2021)             Requested Prescriptions  Pending Prescriptions Disp Refills   QUEtiapine (SEROQUEL) 25 MG tablet 90 tablet 1     Not Delegated - Psychiatry:  Antipsychotics - Second Generation (Atypical) - quetiapine Failed - 11/14/2022  4:06 PM      Failed - This refill cannot be delegated      Failed - TSH in normal range and within 360 days    TSH  Date Value Ref Range Status  06/25/2021 0.42 0.40 - 4.50 mIU/L Final         Failed - Valid encounter within last 6 months    Recent Outpatient Visits           8 months ago Ischemic [place], [place] to [place] skin [place]   [place]ge[place]   10 months ago Wound of right lower extremity, subsequent encounter   [place]   10 months ago Wound of right lower extremity, subsequent encounter   [place]   10 months ago Stage 3b [place]   [place]   10 months ago Wound of right lower extremity, initial encounter   [place]              Failed - Lipid Panel in normal range within the last 12 months    No results found for: "CHOL", "POCCHOL", "CHOLTOT" No results found for: "[place], "LDLC", "HIRISKLDL", "POCLDL", "LDLDIRECT", "REALLDLC", "TOTLDLC" No results found for: "HDL", "POCHDL" No results found for: "TRIG", "POCTRIG"       Passed - Last BP in normal range    BP Readings from Last 1 Encounters:  08/23/22 118/76         Passed - Last Heart Rate in normal range    Pulse Readings from Last 1 Encounters:  08/23/22 65         Passed - CBC within normal limits and completed in the last  12 months    WBC  Date Value Ref Range Status  05/26/2022 6.4 3.8 - 10.8 Thousand/uL Final   RBC  Date Value Ref Range Status  05/26/2022 4.33 4.20 - 5.80 Million/uL Final   Hemoglobin  Date Value Ref Range Status  05/26/2022 13.1 (L) 13.2 - 17.1 g/dL Final   HGB  Date Value Ref Range Status  04/08/2014 13.7 13.0 - 17.1 g/dL Final   HCT  Date Value Ref Range Status  05/26/2022 39.5 38.5 - 50.0 % Final  04/08/2014 40.7 38.4 - 49.9 % Final   MCHC  Date Value Ref Range Status  05/26/2022 33.2 32.0 - 36.0 g/dL Final   [facility] Date Value Ref Range Status  05/26/2022 30.3 27.0 - 33.0 pg Final   MCV  Date Value Ref Range Status  05/26/2022 91.2 80.0 - 100.0 fL Final  04/08/2014 92.8 79.3 - 98.0 fL Final   No results found for: "PLTCOUNTKUC", "LABPLAT", "POCPLA" RDW  Date Value Ref Range Status  05/26/2022 15.3 (H) 11.0 - 15.0 % Final  04/08/2014 14.0 11.0 - 14.6 % Final         Passed - CMP within normal limits and completed in the last 12 months    Albumin  Date Value Ref Range Status  04/13/2022 3.8 3.5 - 5.0 g/dL Final  04/08/2014 3.9 3.5 - 5.0 g/dL Final   Alkaline Phosphatase  Date Value Ref Range Status  04/13/2022 57 38 - 126 U/L Final  04/08/2014 64 40 - 150 U/L Final   Alkaline phosphatase (APISO)  Date Value Ref Range Status  05/26/2022 61 35 - 144 U/L Final   ALT  Date Value Ref Range Status  05/26/2022 14 9 - 46 U/L Final  04/08/2014 13 0 - 55 U/L Final   AST  Date Value Ref Range Status  05/26/2022 18 10 - 35 U/L Final  04/08/2014 19 5 - 34 U/L Final   BUN  Date Value Ref Range Status  05/26/2022 19 7 - 25 mg/dL Final  04/08/2014 17.5 7.0 - 26.0 mg/dL Final   Calcium  Date Value Ref Range Status  05/26/2022 9.0 8.6 - 10.3 mg/dL Final  04/08/2014 9.6 8.4 - 10.4 mg/dL Final   Calcium, Ion  Date Value Ref Range Status  02/13/2012 1.23 1.12 - 1.32 mmol/L Final   CO2  Date Value Ref Range Status  05/26/2022 26 20 - 32 mmol/L Final   04/08/2014 26 22 - 29 mEq/L Final   TCO2  Date Value Ref Range Status  02/13/2012 27 0 - 100 mmol/L Final   Creatinine  Date Value Ref Range Status  04/08/2014 1.4 (H) 0.7 - 1.3 mg/dL Final   Creat  Date Value Ref Range Status  05/26/2022 1.23 (H) 0.70 - 1.22 mg/dL Final   Glucose  Date Value Ref Range Status  04/08/2014 91 70 - 140 mg/dl Final   Glucose, Bld  Date Value Ref Range Status  05/26/2022 85 65 - 99 mg/dL Final    Comment:    .            Fasting reference interval .    Potassium  Date Value Ref Range Status  05/26/2022 4.6 3.5 - 5.3 mmol/L Final  04/08/2014 4.2 3.5 - 5.1 mEq/L Final   Sodium  Date Value Ref Range Status  05/26/2022 143 135 - 146 mmol/L Final  04/08/2014 146 (H) 136 - 145 mEq/L Final   Total Bilirubin  Date Value Ref Range Status  05/26/2022 0.5 0.2 - 1.2 mg/dL Final  04/08/2014 0.66 0.20 - 1.20 mg/dL Final   Bilirubin, Direct  Date Value Ref Range Status  11/07/2018 2.0 (H) 0.0 - 0.2 mg/dL Final   Indirect Bilirubin  Date Value Ref Range Status  11/07/2018 1.4 (H) 0.3 - 0.9 mg/dL Final    Comment:    Performed at [facility], [place] [address], [place], [place] [address]   Protein, ur  Date Value Ref Range Status  04/14/2022 NEGATIVE NEGATIVE mg/dL Final   Total Protein  Date Value Ref Range Status  05/26/2022 6.9 6.1 - 8.1 g/dL Final  04/08/2014 6.7 6.4 - 8.3 g/dL Final   GFR, Est African American  Date Value Ref Range Status  05/18/2020 40 (L) > OR = 60 mL/min/1.9m Final   GFR calc Af Amer  Date Value Ref Range Status  05/27/2020 50 (L) >60 mL/min Final   eGFR  Date Value Ref Range Status  05/26/2022 52 (L) > OR = 60 mL/min/1.796m  Final    Comment:    The eGFR is based on the CKD-EPI 2021 equation. To calculate  the new eGFR from a previous Creatinine or Cystatin C result, go to https://www.kidney.org/professionals/ kdoqi/gfr%5Fcalculator    GFR, Est Non African American  Date Value Ref  Range Status  05/18/2020 35 (L) > OR = 60 mL/min/1.23m Final   GFR, Estimated  Date Value Ref Range Status  04/18/2022 39 (L) >60 mL/min Final    Comment:    (NOTE) Calculated using the CKD-EPI Creatinine Equation (2021)

## 2022-11-25 ENCOUNTER — Ambulatory Visit (INDEPENDENT_AMBULATORY_CARE_PROVIDER_SITE_OTHER)

## 2022-11-25 DIAGNOSIS — I442 Atrioventricular block, complete: Secondary | ICD-10-CM | POA: Diagnosis not present

## 2022-12-01 LAB — CUP PACEART REMOTE DEVICE CHECK
Battery Remaining Longevity: 101 mo
Battery Remaining Percentage: 74 %
Battery Voltage: 3.02 V
Brady Statistic RV Percent Paced: 95 %
Date Time Interrogation Session: 20240103230246
Implantable Lead Connection Status: 753985
Implantable Lead Connection Status: 753985
Implantable Lead Implant Date: 20210701
Implantable Lead Implant Date: 20210701
Implantable Lead Location: 753859
Implantable Lead Location: 753860
Implantable Pulse Generator Implant Date: 20210701
Lead Channel Impedance Value: 490 Ohm
Lead Channel Pacing Threshold Amplitude: 0.875 V
Lead Channel Pacing Threshold Pulse Width: 0.5 ms
Lead Channel Sensing Intrinsic Amplitude: 12 mV
Lead Channel Setting Pacing Amplitude: 1.125
Lead Channel Setting Pacing Pulse Width: 0.5 ms
Lead Channel Setting Sensing Sensitivity: 2 mV
Pulse Gen Model: 2272
Pulse Gen Serial Number: 3842377

## 2022-12-12 NOTE — Progress Notes (Signed)
Remote pacemaker transmission.   

## 2022-12-17 ENCOUNTER — Other Ambulatory Visit: Payer: Self-pay | Admitting: Family Medicine

## 2023-02-14 ENCOUNTER — Telehealth: Payer: Self-pay | Admitting: Family Medicine

## 2023-02-14 NOTE — Telephone Encounter (Signed)
Received call from [place] at [facility] to check on the status of the death certificate; signature required.  Please advise at [phone].

## 2023-02-27 DEATH — deceased
